# Patient Record
Sex: Male | Born: 1945 | Race: Black or African American | Hispanic: No | Smoking: Never smoker
Health system: Southern US, Community
[De-identification: ages and names within clinical notes are randomized; demographics above are authoritative.]

## PROBLEM LIST (undated history)

## (undated) DIAGNOSIS — E785 Hyperlipidemia, unspecified: Secondary | ICD-10-CM

## (undated) DIAGNOSIS — E119 Type 2 diabetes mellitus without complications: Secondary | ICD-10-CM

## (undated) DIAGNOSIS — I10 Essential (primary) hypertension: Secondary | ICD-10-CM

## (undated) DIAGNOSIS — S199XXA Unspecified injury of neck, initial encounter: Secondary | ICD-10-CM

## (undated) DIAGNOSIS — D509 Iron deficiency anemia, unspecified: Secondary | ICD-10-CM

## (undated) HISTORY — PX: CERVICAL SPINE SURGERY: SHX589

---

## 2013-02-21 ENCOUNTER — Inpatient Hospital Stay (HOSPITAL_COMMUNITY)
Admission: EM | Admit: 2013-02-21 | Discharge: 2013-02-28 | DRG: 592 | Disposition: A | Discharge status: Skilled Nursing Facility | Payer: Medicare Other | Attending: Internal Medicine | Admitting: Internal Medicine

## 2013-02-21 ENCOUNTER — Emergency Department (HOSPITAL_COMMUNITY): Payer: Medicare Other

## 2013-02-21 ENCOUNTER — Encounter (HOSPITAL_COMMUNITY): Payer: Self-pay | Admitting: Emergency Medicine

## 2013-02-21 DIAGNOSIS — L8990 Pressure ulcer of unspecified site, unspecified stage: Secondary | ICD-10-CM

## 2013-02-21 DIAGNOSIS — L89629 Pressure ulcer of left heel, unspecified stage: Secondary | ICD-10-CM | POA: Diagnosis present

## 2013-02-21 DIAGNOSIS — L97319 Non-pressure chronic ulcer of right ankle with unspecified severity: Secondary | ICD-10-CM | POA: Diagnosis present

## 2013-02-21 DIAGNOSIS — IMO0002 Reserved for concepts with insufficient information to code with codable children: Secondary | ICD-10-CM

## 2013-02-21 DIAGNOSIS — E875 Hyperkalemia: Secondary | ICD-10-CM | POA: Diagnosis present

## 2013-02-21 DIAGNOSIS — X58XXXA Exposure to other specified factors, initial encounter: Secondary | ICD-10-CM | POA: Diagnosis present

## 2013-02-21 DIAGNOSIS — S90829A Blister (nonthermal), unspecified foot, initial encounter: Secondary | ICD-10-CM | POA: Diagnosis present

## 2013-02-21 DIAGNOSIS — L97509 Non-pressure chronic ulcer of other part of unspecified foot with unspecified severity: Secondary | ICD-10-CM | POA: Diagnosis present

## 2013-02-21 DIAGNOSIS — M899 Disorder of bone, unspecified: Secondary | ICD-10-CM | POA: Diagnosis present

## 2013-02-21 DIAGNOSIS — Z79899 Other long term (current) drug therapy: Secondary | ICD-10-CM

## 2013-02-21 DIAGNOSIS — E119 Type 2 diabetes mellitus without complications: Secondary | ICD-10-CM

## 2013-02-21 DIAGNOSIS — Z7982 Long term (current) use of aspirin: Secondary | ICD-10-CM

## 2013-02-21 DIAGNOSIS — G8250 Quadriplegia, unspecified: Secondary | ICD-10-CM | POA: Diagnosis present

## 2013-02-21 DIAGNOSIS — L89899 Pressure ulcer of other site, unspecified stage: Secondary | ICD-10-CM | POA: Diagnosis present

## 2013-02-21 DIAGNOSIS — L988 Other specified disorders of the skin and subcutaneous tissue: Secondary | ICD-10-CM | POA: Diagnosis present

## 2013-02-21 DIAGNOSIS — M949 Disorder of cartilage, unspecified: Secondary | ICD-10-CM

## 2013-02-21 DIAGNOSIS — Z7401 Bed confinement status: Secondary | ICD-10-CM

## 2013-02-21 DIAGNOSIS — R532 Functional quadriplegia: Secondary | ICD-10-CM | POA: Diagnosis present

## 2013-02-21 DIAGNOSIS — E1169 Type 2 diabetes mellitus with other specified complication: Secondary | ICD-10-CM | POA: Diagnosis present

## 2013-02-21 DIAGNOSIS — N390 Urinary tract infection, site not specified: Secondary | ICD-10-CM | POA: Insufficient documentation

## 2013-02-21 DIAGNOSIS — L97309 Non-pressure chronic ulcer of unspecified ankle with unspecified severity: Principal | ICD-10-CM | POA: Diagnosis present

## 2013-02-21 HISTORY — DX: Unspecified injury of neck, initial encounter: S19.9XXA

## 2013-02-21 LAB — URINE MICROSCOPIC-ADD ON

## 2013-02-21 LAB — URINALYSIS, ROUTINE W REFLEX MICROSCOPIC
Bilirubin Urine: NEGATIVE
Glucose, UA: NEGATIVE mg/dL
Ketones, ur: NEGATIVE mg/dL
Nitrite: NEGATIVE
Protein, ur: 30 mg/dL — AB
SPECIFIC GRAVITY, URINE: 1.018 (ref 1.005–1.030)
Urobilinogen, UA: 0.2 mg/dL (ref 0.0–1.0)
pH: 5.5 (ref 5.0–8.0)

## 2013-02-21 LAB — COMPREHENSIVE METABOLIC PANEL
ALBUMIN: 3.1 g/dL — AB (ref 3.5–5.2)
ALT: <5 U/L (ref 0–53)
AST: 12 U/L (ref 0–37)
Alkaline Phosphatase: 93 U/L (ref 39–117)
BUN: 37 mg/dL — ABNORMAL HIGH (ref 6–23)
CALCIUM: 9.1 mg/dL (ref 8.4–10.5)
CO2: 22 meq/L (ref 19–32)
CREATININE: 1.13 mg/dL (ref 0.50–1.35)
Chloride: 104 mEq/L (ref 96–112)
GFR calc Af Amer: 76 mL/min — ABNORMAL LOW (ref >90)
GFR, EST NON AFRICAN AMERICAN: 65 mL/min — AB (ref >90)
Glucose, Bld: 138 mg/dL — ABNORMAL HIGH (ref 70–99)
Potassium: 4.1 mEq/L (ref 3.7–5.3)
Sodium: 142 mEq/L (ref 137–147)
Total Bilirubin: <0.2 mg/dL — ABNORMAL LOW (ref 0.3–1.2)
Total Protein: 8 g/dL (ref 6.0–8.3)

## 2013-02-21 LAB — CBC WITH DIFFERENTIAL/PLATELET
Basophils Absolute: 0 10*3/uL (ref 0.0–0.1)
Basophils Relative: 0 % (ref 0–1)
EOS ABS: 0.3 10*3/uL (ref 0.0–0.7)
EOS PCT: 4 % (ref 0–5)
HEMATOCRIT: 32 % — AB (ref 39.0–52.0)
Hemoglobin: 10.6 g/dL — ABNORMAL LOW (ref 13.0–17.0)
Lymphocytes Relative: 17 % (ref 12–46)
Lymphs Abs: 1.2 10*3/uL (ref 0.7–4.0)
MCH: 29.9 pg (ref 26.0–34.0)
MCHC: 33.1 g/dL (ref 30.0–36.0)
MCV: 90.4 fL (ref 78.0–100.0)
MONO ABS: 0.7 10*3/uL (ref 0.1–1.0)
Monocytes Relative: 10 % (ref 3–12)
Neutro Abs: 4.8 10*3/uL (ref 1.7–7.7)
Neutrophils Relative %: 68 % (ref 43–77)
Platelets: 280 10*3/uL (ref 150–400)
RBC: 3.54 MIL/uL — ABNORMAL LOW (ref 4.22–5.81)
RDW: 14.8 % (ref 11.5–15.5)
WBC: 7.1 10*3/uL (ref 4.0–10.5)

## 2013-02-21 IMAGING — CR DG FOOT COMPLETE 3+V*R*
3 series · 3 of 3 positions shown · non-contrast
Comparison: None.

CLINICAL DATA: Large great toe blister.

EXAM:
RIGHT FOOT COMPLETE - 3+ VIEW

[x foot lat right]
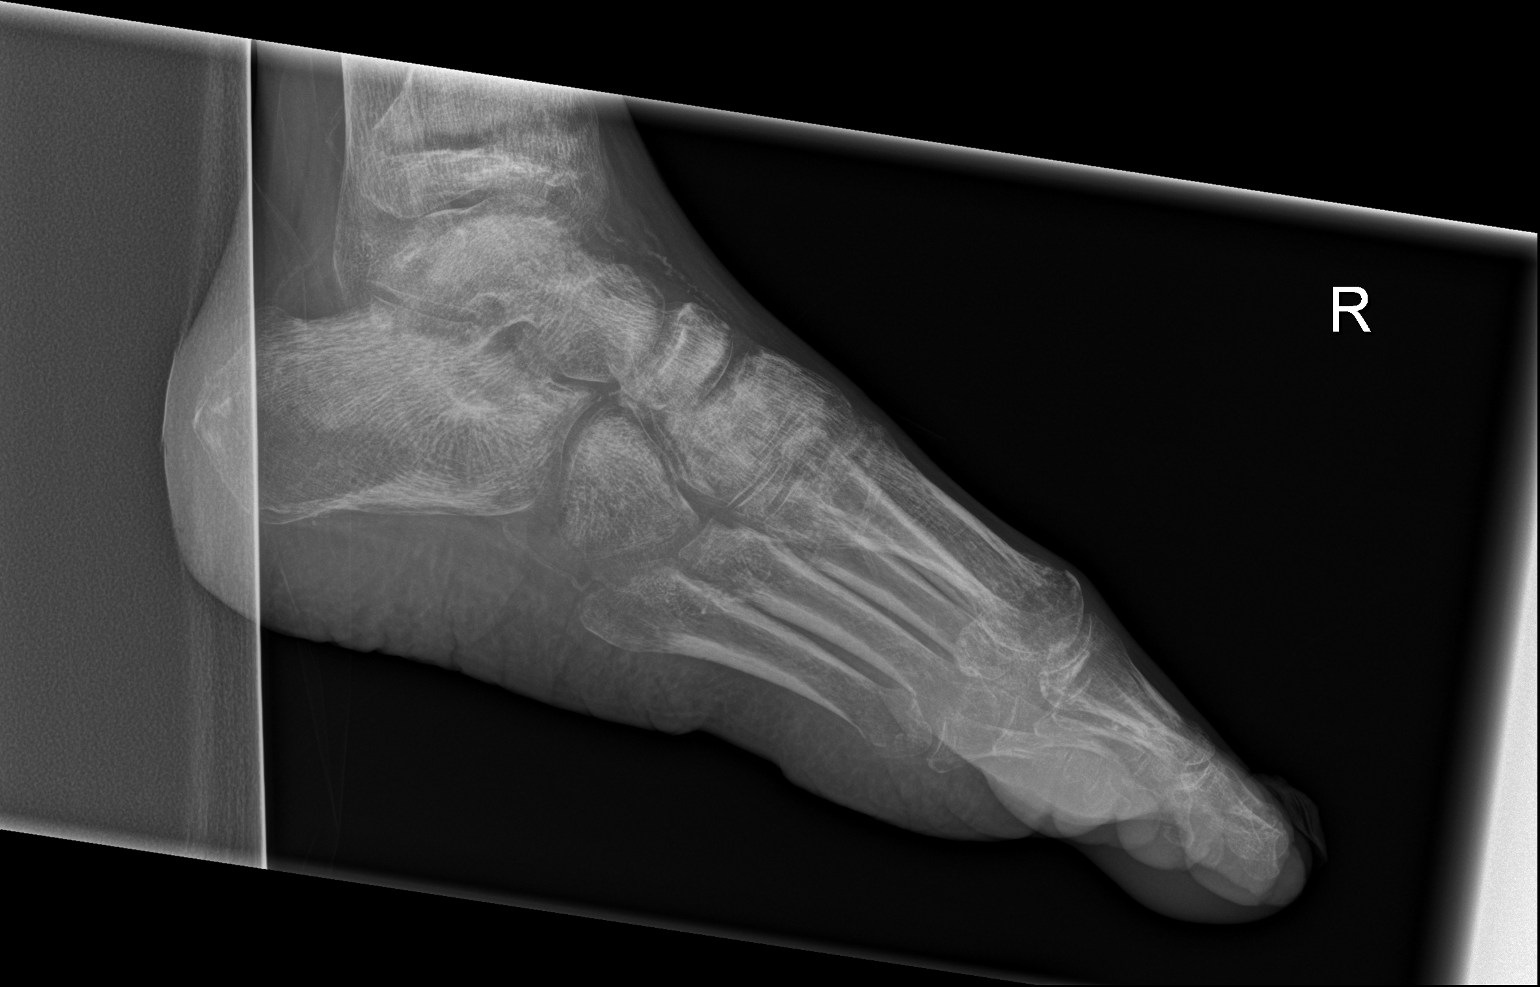

[x foot ap right]
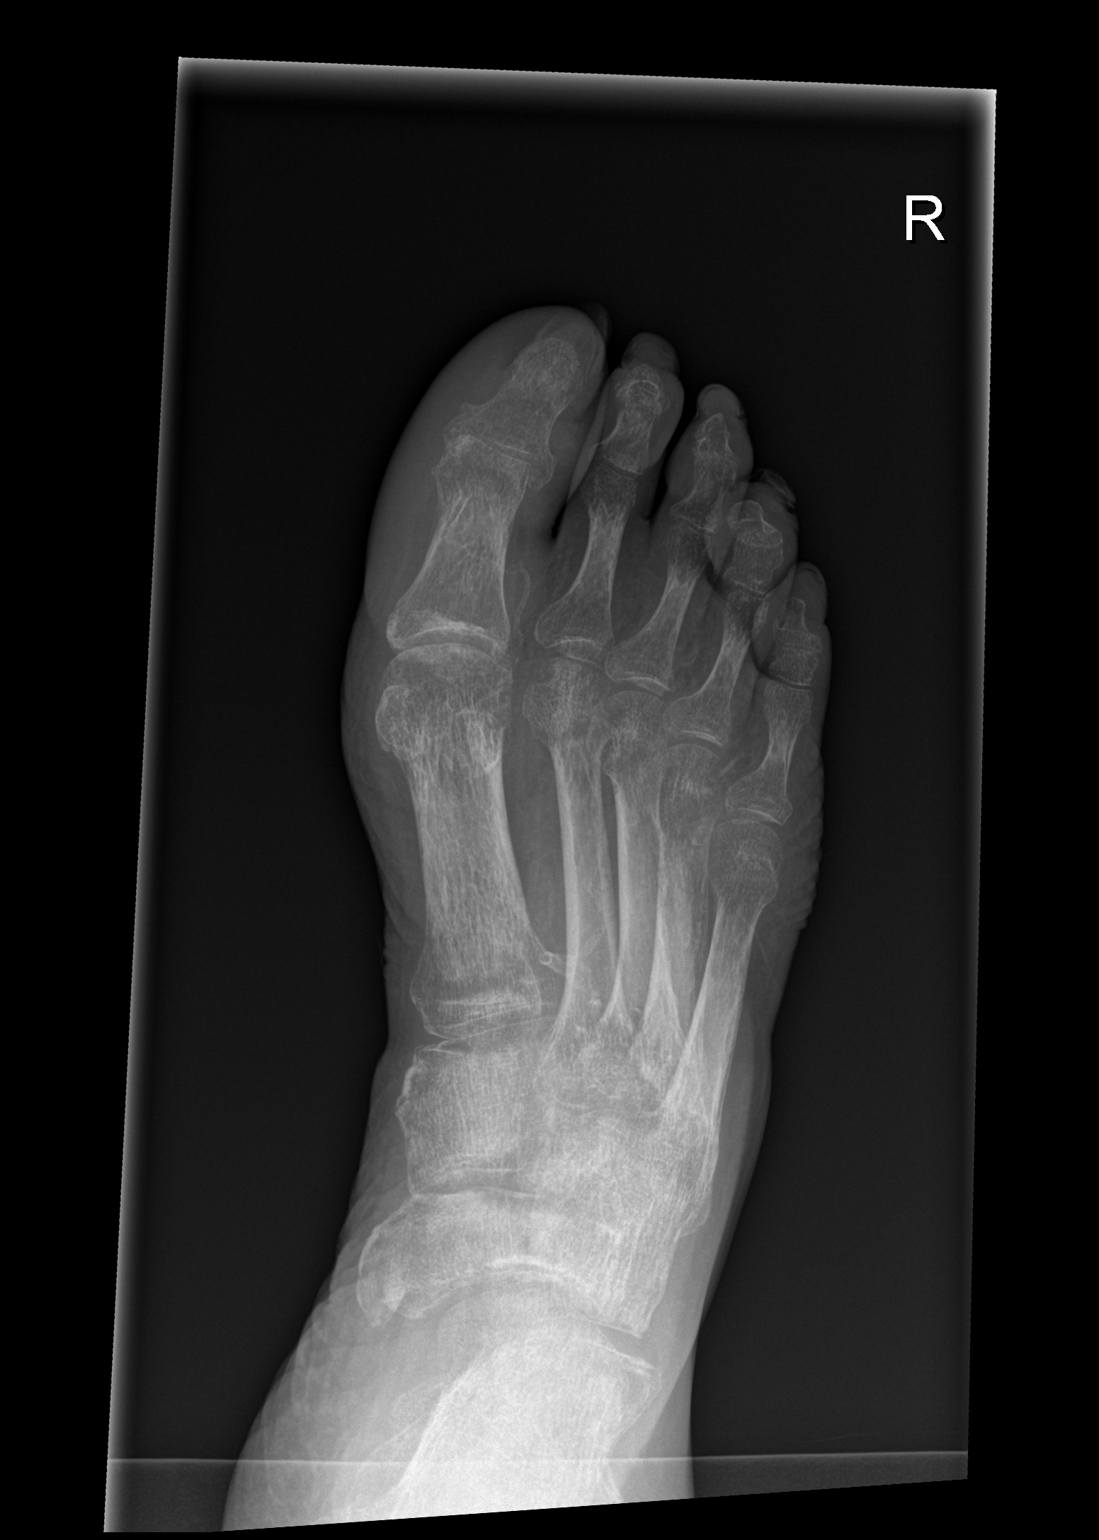

[x foot obl right]
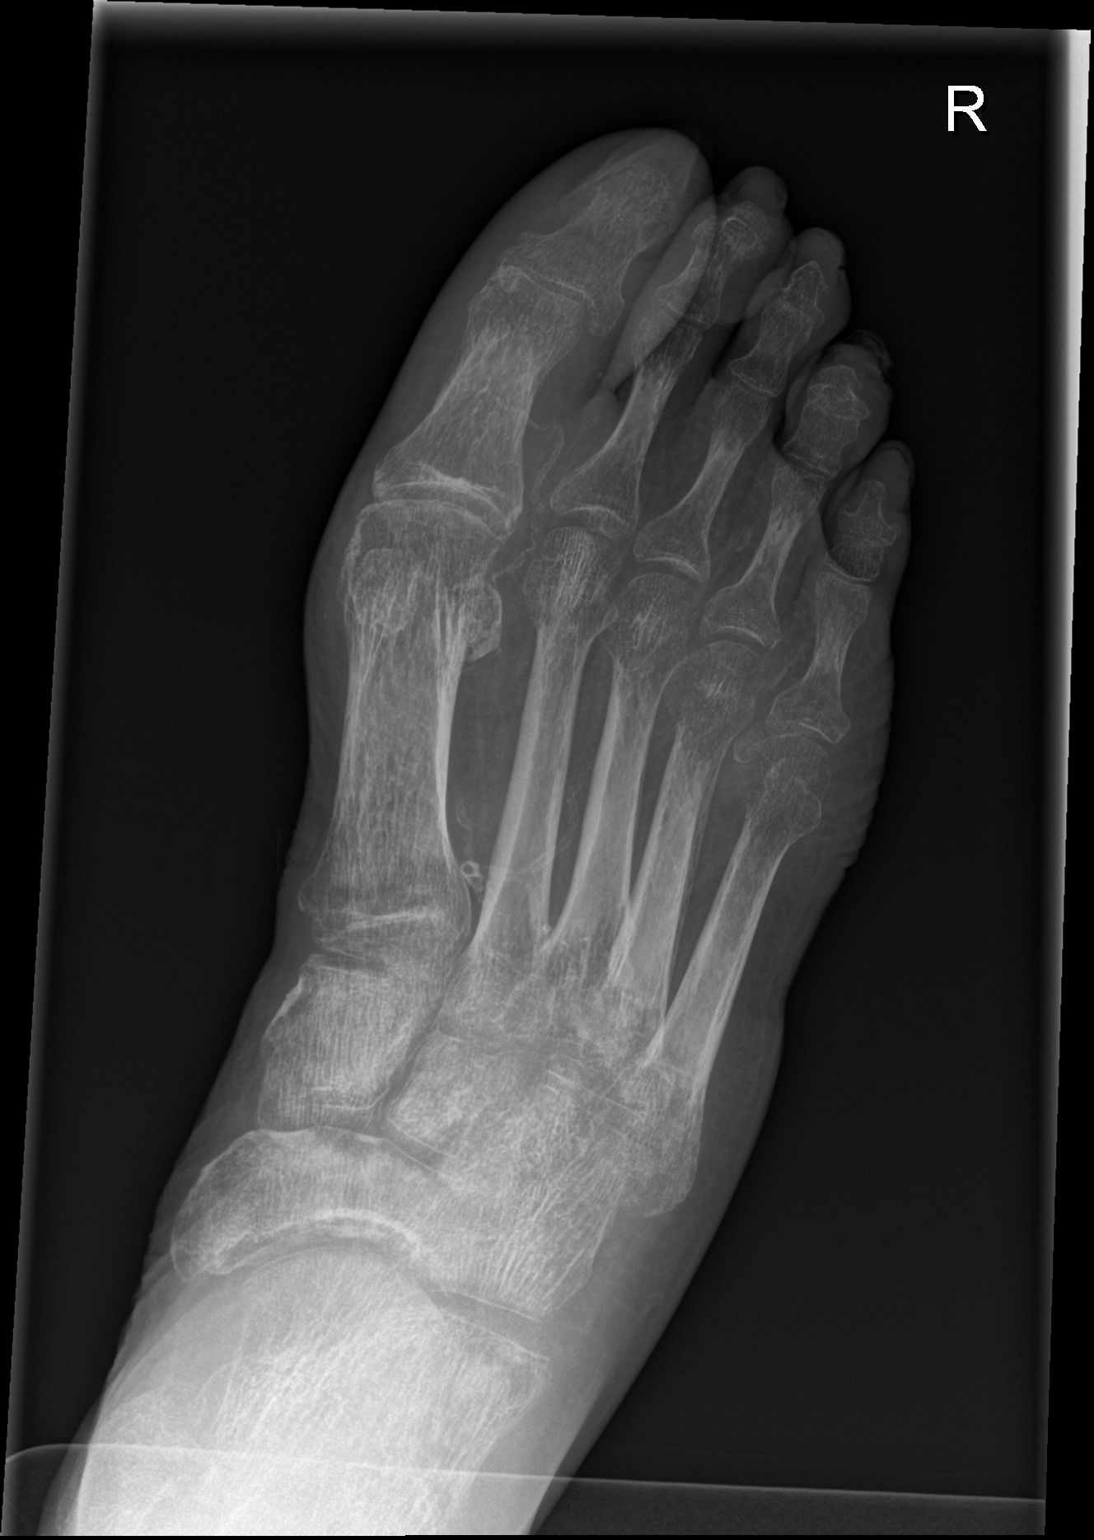

[3 of 3 positions shown; findings below may reference images not displayed]

FINDINGS: Diffuse osteopenia. Arterial calcifications. No fracture or
dislocation seen. No bone destruction or periosteal reaction.
IMPRESSION: Diffuse osteopenia. See the previous discussion on the right lower
leg radiographs. No acute bony abnormality.

## 2013-02-21 IMAGING — CR DG TIBIA/FIBULA 2V*R*
4 series · 4 of 4 positions shown · non-contrast
Comparison: None.

CLINICAL DATA: Right lower leg bed sore.

EXAM:
RIGHT TIBIA AND FIBULA - 2 VIEW

[x tib-fib ap right]
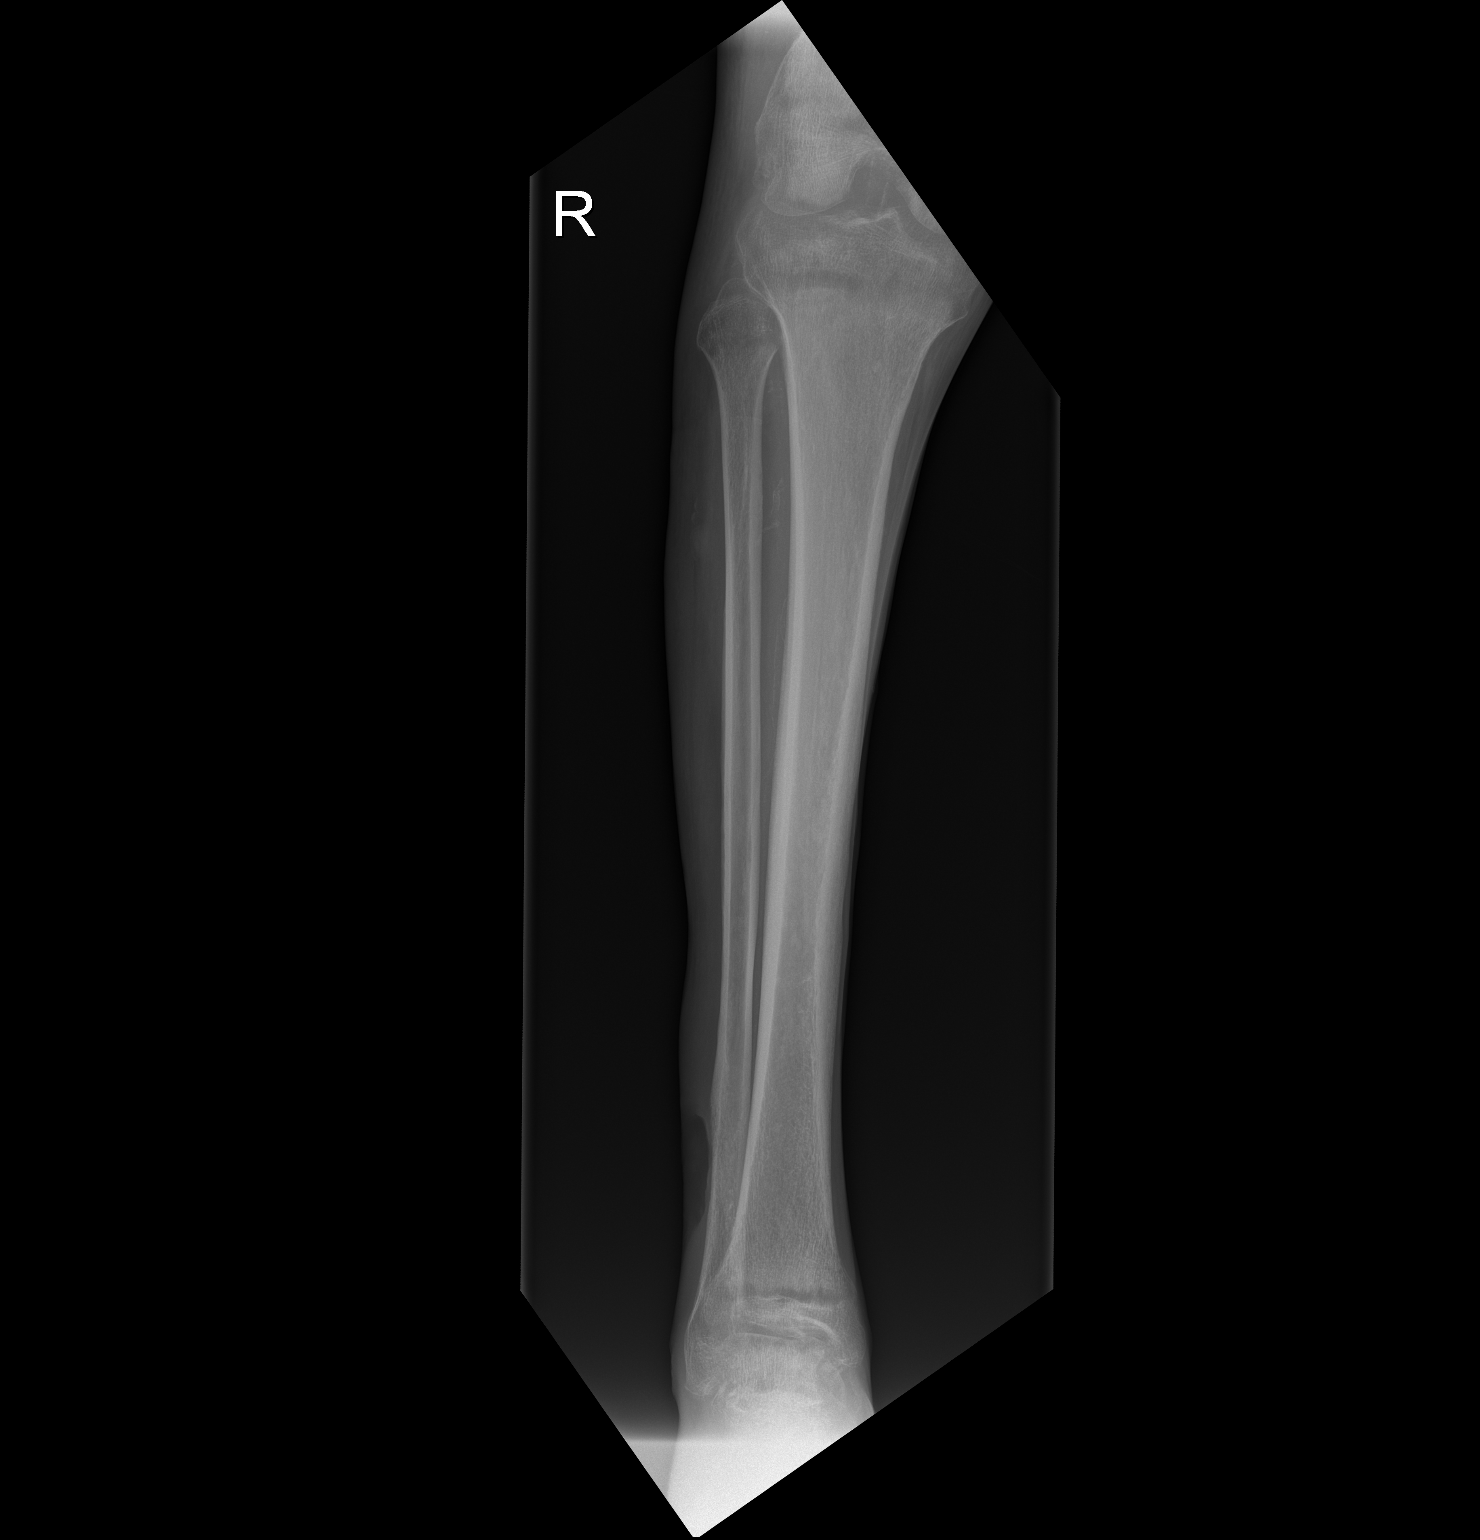

[x tib-fib lat right (1 of 3)]
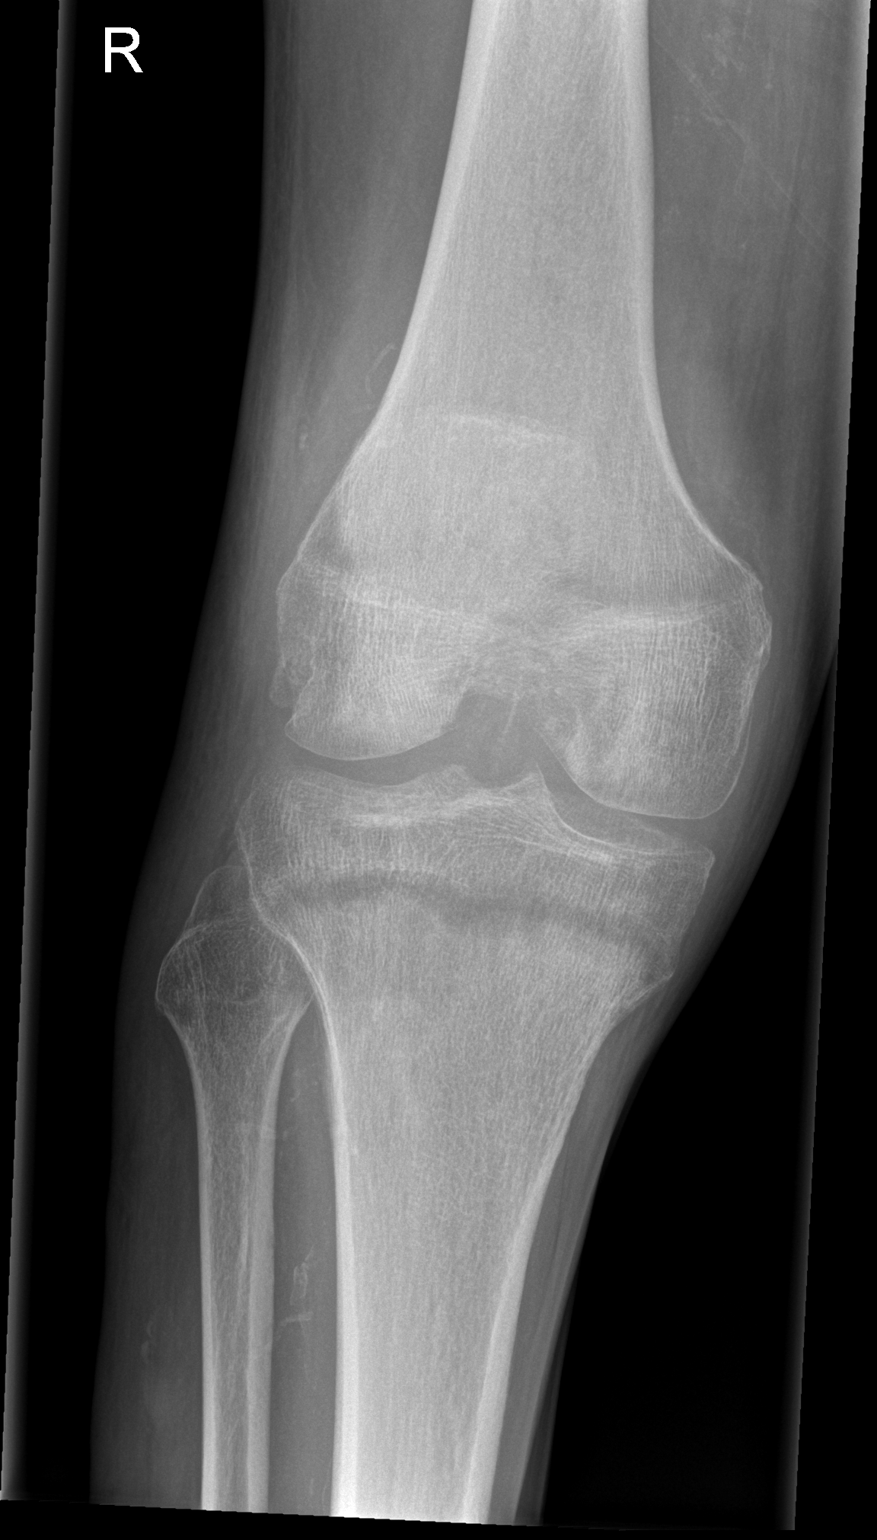

[x tib-fib lat right (2 of 3)]
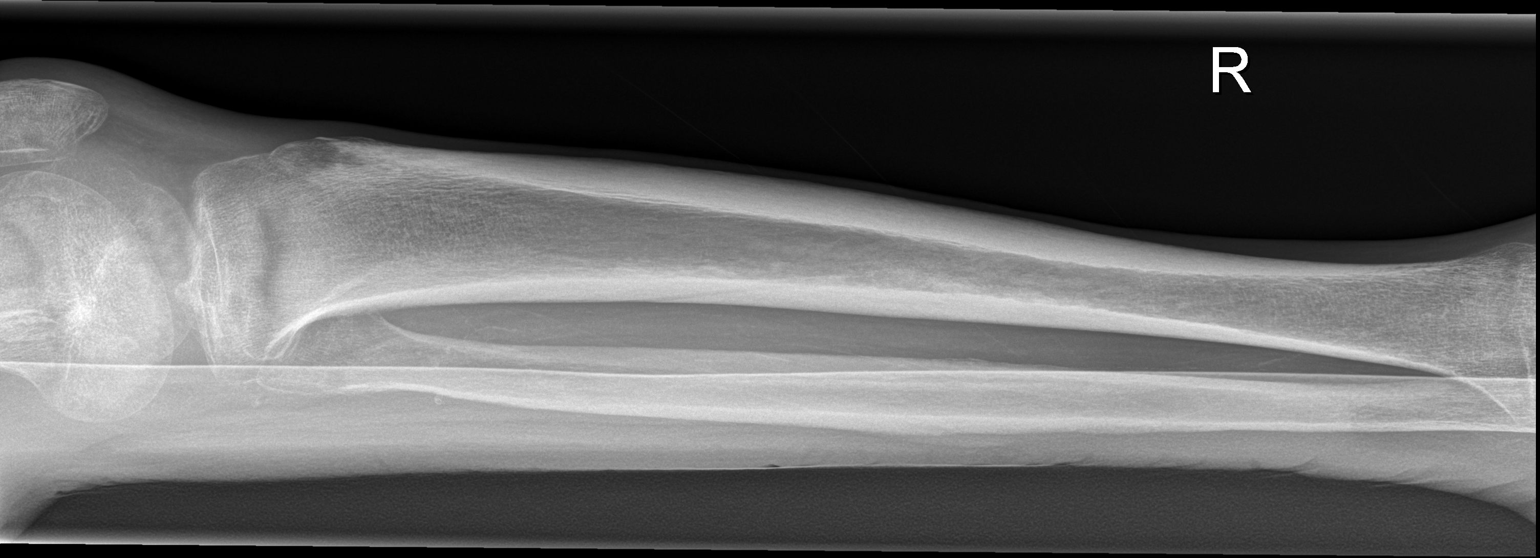

[x tib-fib lat right (3 of 3)]
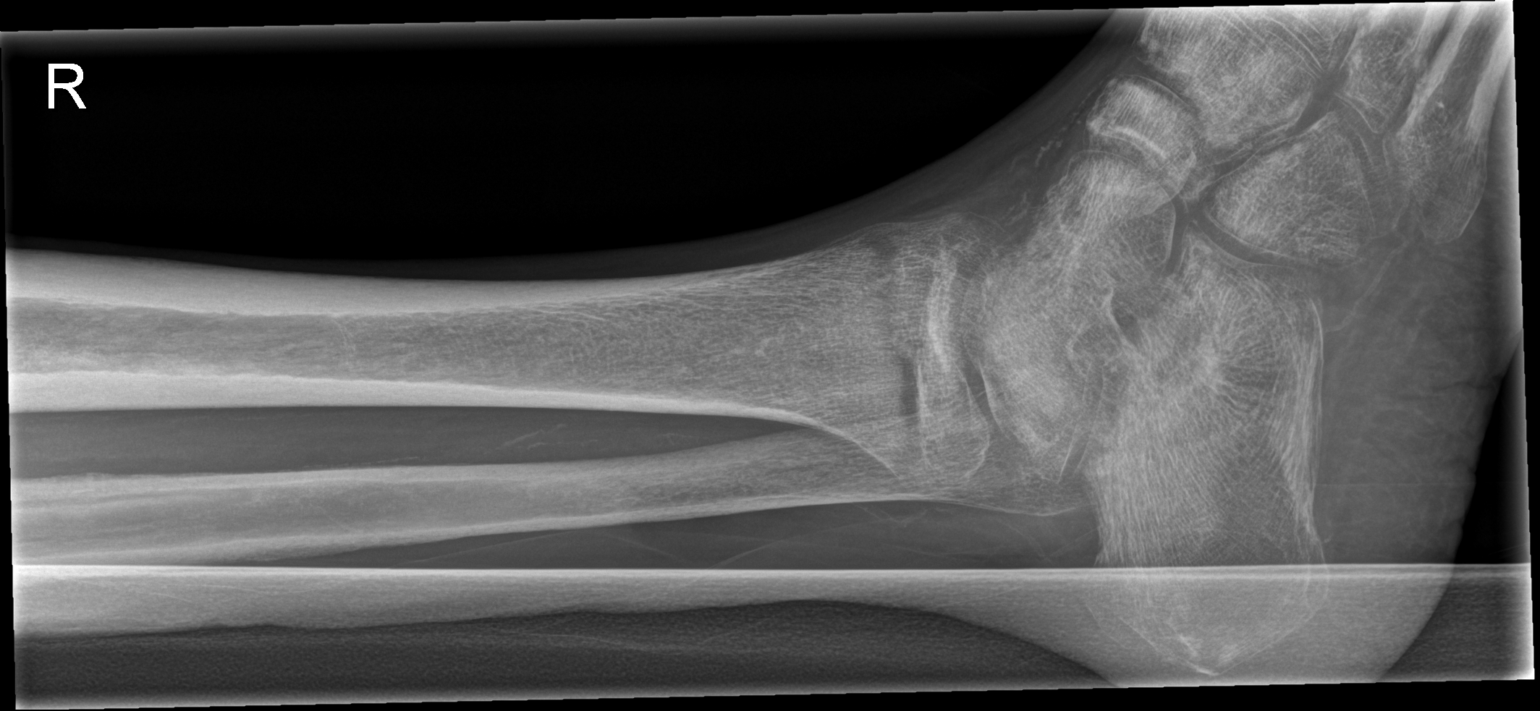

[4 of 4 positions shown; findings below may reference images not displayed]

FINDINGS: Soft tissue defect in the lateral aspect of the distal right lower
leg. Diffuse growth plate lucencies at the level of the ankle and
knee. No bone destruction or periosteal reaction at the location of
the soft tissue defect. Diffuse osteopenia involving the tarsal
bones.
IMPRESSION: 1. Distal lateral soft tissue defect without evidence of underlying
osteomyelitis.
2. Disuse osteopenia.
3. Diffuse bone resorption at all of the patient's previous growth
plates. This can be seen with rickets (osteomalacia), including
vitamin-D deficiency. Leukemia can also produce this appearance.

## 2013-02-21 MED ORDER — DEXTROSE 5 % IV SOLN: 1.0000 g | INTRAVENOUS | Status: DC

## 2013-02-21 MED ORDER — ASPIRIN 81 MG PO CHEW
81.0000 mg | CHEWABLE_TABLET | Freq: Every day | ORAL | Status: DC
  Administered 2013-02-22 – 2013-02-28 (×7): 81 mg via ORAL
  Filled 2013-02-21 (×7): qty 1

## 2013-02-21 MED ORDER — HEPARIN SODIUM (PORCINE) 5000 UNIT/ML IJ SOLN
5000.0000 [IU] | Freq: Three times a day (TID) | INTRAMUSCULAR | Status: DC
  Administered 2013-02-22 – 2013-02-27 (×18): 5000 [IU] via SUBCUTANEOUS
  Filled 2013-02-21 (×20): qty 1

## 2013-02-21 MED ORDER — FERROUS SULFATE 325 (65 FE) MG PO TABS
325.0000 mg | ORAL_TABLET | Freq: Two times a day (BID) | ORAL | Status: DC
  Administered 2013-02-22 – 2013-02-28 (×13): 325 mg via ORAL
  Filled 2013-02-21 (×15): qty 1

## 2013-02-21 MED ORDER — METFORMIN HCL ER 500 MG PO TB24
500.0000 mg | ORAL_TABLET | Freq: Every day | ORAL | Status: DC
  Administered 2013-02-22 – 2013-02-28 (×7): 500 mg via ORAL
  Filled 2013-02-21 (×8): qty 1

## 2013-02-21 MED ORDER — TRAMADOL HCL 50 MG PO TABS
50.0000 mg | ORAL_TABLET | Freq: Two times a day (BID) | ORAL | Status: DC
  Administered 2013-02-22 – 2013-02-28 (×13): 50 mg via ORAL
  Filled 2013-02-21 (×13): qty 1

## 2013-02-21 MED ORDER — ESCITALOPRAM OXALATE 10 MG PO TABS
10.0000 mg | ORAL_TABLET | Freq: Every day | ORAL | Status: DC
  Administered 2013-02-22 – 2013-02-28 (×7): 10 mg via ORAL
  Filled 2013-02-21 (×7): qty 1

## 2013-02-21 MED ORDER — DEXTROSE 5 % IV SOLN
1.0000 g | INTRAVENOUS | Status: DC
  Administered 2013-02-21 – 2013-02-23 (×3): 1 g via INTRAVENOUS
  Filled 2013-02-21 (×4): qty 10

## 2013-02-21 MED ORDER — LISINOPRIL 5 MG PO TABS
5.0000 mg | ORAL_TABLET | Freq: Every day | ORAL | Status: DC
  Administered 2013-02-23 – 2013-02-25 (×2): 5 mg via ORAL
  Filled 2013-02-21 (×4): qty 1
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
## 2013-02-21 NOTE — ED Notes (Signed)
MD Allen at bedside.

## 2013-02-21 NOTE — ED Notes (Signed)
Bed: [id number] Expected date: 02/21/13 Expected time: 5:54 PM Means of arrival: Ambulance Comments: Bed sores

## 2013-02-21 NOTE — ED Notes (Signed)
Tired getting urine from patient. Foley may need to be inserted because he is in content at times.
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
## 2013-02-21 NOTE — ED Notes (Signed)
Pt in XRAY 

## 2013-02-21 NOTE — ED Notes (Addendum)
Pt home via [facility] c/o bedsore to right lower leg and and a large blister to great toe of right foot. Pt has good peripheral pulses and denies pain at this time. He reports having the blister to the great toe x 3 weeks and the bedsores to posterior lateral lower right leg x 3 months.

## 2013-02-22 ENCOUNTER — Encounter (HOSPITAL_COMMUNITY): Payer: Self-pay

## 2013-02-22 DIAGNOSIS — E119 Type 2 diabetes mellitus without complications: Secondary | ICD-10-CM

## 2013-02-22 LAB — GLUCOSE, CAPILLARY
GLUCOSE-CAPILLARY: 94 mg/dL (ref 70–99)
GLUCOSE-CAPILLARY: 107 mg/dL — AB (ref 70–99)
Glucose-Capillary: 142 mg/dL — ABNORMAL HIGH (ref 70–99)

## 2013-02-22 LAB — CBC
HCT: 25.8 % — ABNORMAL LOW (ref 39.0–52.0)
Hemoglobin: 8.6 g/dL — ABNORMAL LOW (ref 13.0–17.0)
MCH: 29.9 pg (ref 26.0–34.0)
MCHC: 33.3 g/dL (ref 30.0–36.0)
MCV: 89.6 fL (ref 78.0–100.0)
Platelets: 265 10*3/uL (ref 150–400)
RBC: 2.88 MIL/uL — AB (ref 4.22–5.81)
RDW: 15 % (ref 11.5–15.5)
WBC: 5.9 10*3/uL (ref 4.0–10.5)

## 2013-02-22 LAB — BASIC METABOLIC PANEL
BUN: 38 mg/dL — ABNORMAL HIGH (ref 6–23)
CALCIUM: 8.3 mg/dL — AB (ref 8.4–10.5)
CO2: 21 mEq/L (ref 19–32)
Chloride: 104 mEq/L (ref 96–112)
Creatinine, Ser: 0.96 mg/dL (ref 0.50–1.35)
GFR calc Af Amer: >90 mL/min (ref >90)
GFR, EST NON AFRICAN AMERICAN: 84 mL/min — AB (ref >90)
Glucose, Bld: 219 mg/dL — ABNORMAL HIGH (ref 70–99)
Potassium: 3.6 mEq/L — ABNORMAL LOW (ref 3.7–5.3)
SODIUM: 139 meq/L (ref 137–147)

## 2013-02-22 LAB — MRSA PCR SCREENING: MRSA BY PCR: NEGATIVE

## 2013-02-22 MED ORDER — MORPHINE SULFATE 2 MG/ML IJ SOLN
2.0000 mg | INTRAMUSCULAR | Status: DC | PRN
  Administered 2013-02-23 – 2013-02-27 (×6): 2 mg via INTRAVENOUS
  Filled 2013-02-22 (×7): qty 1
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
## 2013-02-22 NOTE — Progress Notes (Signed)
Utilization review completed.  
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
## 2013-02-23 LAB — URINE CULTURE: Colony Count: >=100000

## 2013-02-23 LAB — GLUCOSE, CAPILLARY
GLUCOSE-CAPILLARY: 167 mg/dL — AB (ref 70–99)
Glucose-Capillary: 161 mg/dL — ABNORMAL HIGH (ref 70–99)
Glucose-Capillary: 166 mg/dL — ABNORMAL HIGH (ref 70–99)
Glucose-Capillary: 133 mg/dL — ABNORMAL HIGH (ref 70–99)

## 2013-02-23 MED ORDER — ENSURE COMPLETE PO LIQD
237.0000 mL | Freq: Three times a day (TID) | ORAL | Status: DC
  Administered 2013-02-23 – 2013-02-28 (×14): 237 mL via ORAL

## 2013-02-23 MED ORDER — SODIUM CHLORIDE 0.9 % IV BOLUS (SEPSIS)
500.0000 mL | Freq: Once | INTRAVENOUS | Status: AC
  Administered 2013-02-24: 500 mL via INTRAVENOUS

## 2013-02-23 MED ORDER — ADULT MULTIVITAMIN W/MINERALS CH
1.0000 | ORAL_TABLET | Freq: Every day | ORAL | Status: DC
  Administered 2013-02-24 – 2013-02-28 (×6): 1 via ORAL
  Filled 2013-02-23 (×6): qty 1
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
## 2013-02-23 NOTE — Progress Notes (Signed)
Clinical Social Work Department BRIEF PSYCHOSOCIAL ASSESSMENT 02/23/2013  Patient:  [name] [name],[name] [name]     Account Number:  [id number]     Admit date:  02/21/2013  Clinical Social Worker:  Earlie Server  Date/Time:  02/23/2013 02:30 PM  Referred by:  Physician  Date Referred:  02/23/2013 Referred for  SNF Placement   Other Referral:   Interview type:  Patient Other interview type:    PSYCHOSOCIAL DATA Living Status:  FAMILY Admitted from facility:   Level of care:   Primary support name:  [name] Primary support relationship to patient:  CHILD, ADULT Degree of support available:   Strong    CURRENT CONCERNS Current Concerns  Post-Acute Placement   Other Concerns:    SOCIAL WORK ASSESSMENT / PLAN CSW received referral in order to assist with DC planning. CSW reviewed chart and met with patient at bedside. CSW introduced myself and explained role.    Patient reports he was living at home with his dtr prior to be admitting to the hospital. Patient reports that dtr does not work and that she assists with all ADLs. Patient states that dtr may leave the house to go shopping but is not gone for long. Patient reports that he is unable to do anything by himself and relies on dtr for needs. CSW and patient discussed SNF placement at DC. CSW explained the process and explained that patient could receive nursing care at [facility]. CSW explained insurance coverage as well. Patient reports he is not interested in SNF placement and will return home at DC. Patient reports he is not active with HH but that dtr can learn to assist with any new medical needs. CSW explained the benefits of SNF and reported that patient could even go for ST if desired but patient continues to refuse placement.    CSW will continue to follow and assist if patient chooses SNF placement.   Assessment/plan status:  Psychosocial Support/Ongoing Assessment of Needs Other assessment/ plan:   Information/referral to community  resources:   SNF information    PATIENT'S/FAMILY'S RESPONSE TO PLAN OF CARE: Patient alert and oriented. Patient reports that dtr is very helpful and he is ready to return back home. Patient states that he loves his dtr and would feel most comfortable at home. Patient is not interested in SNF and worrying about losing his privacy if he has to go to a SNF. Patient aware that he could go ST to determine if he wants to stay LT but patient not agreeable at this time. Patient polite and engaged and thanked CSW for time.       [place], [place] [phone]

## 2013-02-23 NOTE — Consult Note (Signed)
WOC wound consult note Reason for Consult: Right toe, right LE (lateral aspect), sacral ulcers Wound type: Pressure Pressure Ulcer POA: Yes Measurement:right great toe (hallux) with intact blood filled blister on medial aspect measuring 7.5cm x 5cm. Skin is taught. periwound is not erythematous.  Patient cannot recall injuring toe.  Chronic non-healing wounds on lateral aspect of right LE:  A deep, full thickness defect measuring 4cm x 2cm x 0.5cm with a red, wet base has a small proximal (1.0cm round x 0.2cm) and a small distal wound (1.0cm round x 0.2cm) both partial thickness and clean pink and moist. The sacrum presents with a recurring ulcer over a previously healed Stage III pressure ulcer measuring 1.5cm x 1cm x 0.2cm and presenting as clean, pink and moist. Wound bed: As described above. Drainage (amount, consistency, odor) scant from all partial thickness wounds (sacrum, proximal and distal Le on right) and moderate from the right lateral LE. Periwound:intact with evidence of previous healing. Dressing procedure/placement/frequency:I will ask nursing to continue observation of the blood filled blister on the right medial toe and have given them a dressing plan for the area if the blister should rupture.  The partial thickness wounds will benefit from soft silicone foam dressings changed twice weekly and the wound with depth on the right lateral LE will need filling with an absorbant antimicrobial dressing (Aquacel Ag+) changed daily.  Patient states he has not been out of bed for an extended period of time due to his caregiver having back issues.  His finger nails are long and loosening and are at risk for creating wounds on the palmar area of his right hand.  I understand that patient is not willing to consider another level of care other than home, but it would be my recommendation to pursue a higher level of care or additional support for this patient in the home care setting. [facility] nursing team  will not follow, but will remain available to this patient, the nursing and medical teams.  Please re-consult if needed. Thanks, Maudie Flakes, MSN, RN, [place], [place], [place] [phone])

## 2013-02-23 NOTE — Clinical Documentation Improvement (Signed)
Possible Clinical Conditions?  Severe Malnutrition , Underweight with BMI  18.91 Protein Calorie Malnutrition , , Underweight with BMI  18.91 Severe Protein Calorie Malnutrition,   , Underweight with BMI  18.91 Other Condition Cannot clinically determine  Supporting Information: Per 02/23/13 RD progress note: Pt meets criteria for severe MALNUTRITION in the context of chronic illness as evidenced by severe muscle wasting and subcutaneous fat loss in orbital/temporal and clavicle region and arms.  BMI:  Body mass index is 18.19 kg/(m^2).     Underweight    Thank You, Serena Colonel ,RN Clinical Documentation Specialist:  [place] [employer] [facility]
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
## 2013-02-23 NOTE — Consult Note (Signed)
I participated in the care of this patient and agree with the above history, physical and evaluation. I performed a review of the history and a physical exam as detailed.  As patient is still in house I will discuss with Dr. Sharol Given about seeing him while hospitalized.    Carole Binning MD

## 2013-02-23 NOTE — Progress Notes (Signed)
Assisted [name] [name], NT with patients bath/linen change.  Patient has multiple healing buttocks sores present on admission covered with foam dressing.  Patient states he lives at home with his daughter who can not lift him so he is in bed all the time.  Right great toe blister present, right ankle radiating upwards leg, dressed with foam dressing, no drainage observed.  Patient turned, lotion, and patient protective creams applied to limbs, hands, arms, and feet.  Right hand is contracted and rolled cloth placed in palm with fingers around cloth.

## 2013-02-24 LAB — WOUND CULTURE: GRAM STAIN: NONE SEEN

## 2013-02-24 LAB — GLUCOSE, CAPILLARY: Glucose-Capillary: 137 mg/dL — ABNORMAL HIGH (ref 70–99)

## 2013-02-24 MED ORDER — ACETAMINOPHEN 325 MG PO TABS
650.0000 mg | ORAL_TABLET | ORAL | Status: DC | PRN
  Administered 2013-02-24 – 2013-02-28 (×2): 650 mg via ORAL
  Filled 2013-02-24 (×2): qty 2

## 2013-02-24 MED ORDER — SILVER SULFADIAZINE 1 % EX CREA
TOPICAL_CREAM | Freq: Every day | CUTANEOUS | Status: DC
  Administered 2013-02-26 – 2013-02-28 (×3): via TOPICAL
  Filled 2013-02-24: qty 50

## 2013-02-24 MED ORDER — SODIUM CHLORIDE 0.9 % IV SOLN
INTRAVENOUS | Status: DC
  Administered 2013-02-24: 75 mL/h via INTRAVENOUS
  Administered 2013-02-25 – 2013-02-26 (×3): via INTRAVENOUS
  Administered 2013-02-26: 75 mL via INTRAVENOUS

## 2013-02-24 MED ORDER — LEVOFLOXACIN IN D5W 750 MG/150ML IV SOLN
750.0000 mg | INTRAVENOUS | Status: DC
  Administered 2013-02-24: 750 mg via INTRAVENOUS
  Filled 2013-02-24 (×2): qty 150

## 2013-02-24 NOTE — Progress Notes (Signed)
Clinical Social Work  CSW met with patient and CM at bedside in order to discuss DC planning. Patient laying in bed and reports that he still wants to return home at DC. CM spoke with patient in further detail about medical needs and support that would be provided through HH. Patient reports that he wants to talk with dtr but dtr is at work right now. Previously patient had reported that dtr does not work but today he states that she works a few hours a day. CSW and CM spoke with patient about concern for getting proper care and needing 24 hour supervision along with nursing care for wounds. Patient reports he is not happy about SNF placement but would be agreeable to go on ST basis if dtr is agreeable.CSW called and spoke with dtr who reports she has been trying to care for patient but is understanding that ST SNF could be beneficial for patient.  Patient and dtr aware of SNF process along with Medicare coverage for SNF placement. Patient agreeable to [place] search. CSW agreeable to provide bed offers to patient and dtr.  CSW completed FL2 and faxed out. CSW will continue to follow.   , LCSW [phone] 

## 2013-02-24 NOTE — Discharge Instructions (Signed)
Continued daily Silvadene dressing changes to the ulcers to the right foot and ankle.
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
## 2013-02-24 NOTE — Progress Notes (Addendum)
Clinical Social Work Department CLINICAL SOCIAL WORK PLACEMENT NOTE 02/24/2013  Patient:  [name] [name],[name] [name]  Account Number:  [id number] Admit date:  02/21/2013  Clinical Social Worker:  Sindy Messing, LCSW  Date/time:  02/24/2013 11:30 AM  Clinical Social Work is seeking post-discharge placement for this patient at the following level of care:   [place]   (*CSW will update this form in Epic as items are completed)   02/24/2013  Patient/family provided with [place] Department of Clinical Social Work's list of facilities offering this level of care within the geographic area requested by the patient (or if unable, by the patient's family).  02/24/2013  Patient/family informed of their freedom to choose among providers that offer the needed level of care, that participate in Medicare, Medicaid or managed care program needed by the patient, have an available bed and are willing to accept the patient.  02/24/2013  Patient/family informed of [facility]' ownership interest in [facility], as well as of the fact that they are under no obligation to receive care at this facility.  PASARR submitted to EDS on existing # PASARR number received from EDS on   FL2 transmitted to all facilities in geographic area requested by pt/family on  02/24/2013 FL2 transmitted to all facilities within larger geographic area on   Patient informed that his/her managed care company has contracts with or will negotiate with  certain facilities, including the following:     Patient/family informed of bed offers received:  02/25/13 Patient chooses bed at [facility] Physician recommends and patient chooses bed at    Patient to be transferred to  on   Patient to be transferred to facility by   The following physician request were entered in Epic:   Additional Comments:

## 2013-02-24 NOTE — Progress Notes (Addendum)
Pt hypotensive, T: 100.2 R P: 98  RR: 20  BP: 74/48 SpO2: 98, alert and oriented at baseline. On-call provider informed.Will administer Tylenol and implement order as directed. Will reassess VS and continue to monitor.
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
## 2013-02-25 DIAGNOSIS — E875 Hyperkalemia: Secondary | ICD-10-CM

## 2013-02-25 LAB — GLUCOSE, CAPILLARY: Glucose-Capillary: 103 mg/dL — ABNORMAL HIGH (ref 70–99)

## 2013-02-25 LAB — BASIC METABOLIC PANEL
BUN: 37 mg/dL — ABNORMAL HIGH (ref 6–23)
CO2: 22 mEq/L (ref 19–32)
Calcium: 8.5 mg/dL (ref 8.4–10.5)
Chloride: 103 mEq/L (ref 96–112)
Creatinine, Ser: 1.25 mg/dL (ref 0.50–1.35)
GFR calc Af Amer: 67 mL/min — ABNORMAL LOW (ref >90)
GFR, EST NON AFRICAN AMERICAN: 58 mL/min — AB (ref >90)
GLUCOSE: 110 mg/dL — AB (ref 70–99)
POTASSIUM: 5.8 meq/L — AB (ref 3.7–5.3)
SODIUM: 136 meq/L — AB (ref 137–147)

## 2013-02-25 LAB — CBC
HCT: 24.2 % — ABNORMAL LOW (ref 39.0–52.0)
HEMOGLOBIN: 7.8 g/dL — AB (ref 13.0–17.0)
MCH: 29.1 pg (ref 26.0–34.0)
MCHC: 32.2 g/dL (ref 30.0–36.0)
MCV: 90.3 fL (ref 78.0–100.0)
Platelets: 264 10*3/uL (ref 150–400)
RBC: 2.68 MIL/uL — ABNORMAL LOW (ref 4.22–5.81)
RDW: 15.5 % (ref 11.5–15.5)
WBC: 5.8 10*3/uL (ref 4.0–10.5)

## 2013-02-25 MED ORDER — INSULIN ASPART 100 UNIT/ML ~~LOC~~ SOLN: 10.0000 [IU] | Freq: Once | SUBCUTANEOUS | Status: DC

## 2013-02-25 MED ORDER — DEXTROSE 50 % IV SOLN
1.0000 | Freq: Once | INTRAVENOUS | Status: AC
  Administered 2013-02-25: 50 mL via INTRAVENOUS
  Filled 2013-02-25: qty 50

## 2013-02-25 MED ORDER — INSULIN ASPART 100 UNIT/ML IV SOLN
10.0000 [IU] | Freq: Once | INTRAVENOUS | Status: AC
  Administered 2013-02-25: 10 [IU] via INTRAVENOUS
  Filled 2013-02-25: qty 0.1
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
## 2013-02-25 NOTE — Progress Notes (Signed)
Clinical Social Work  CSW met with patient at bedside and provided bed offers. Patient reports he is not familiar with any SNFs and is unsure about choosing a facility. CSW spoke with dtr via phone who reports she will assist patient in choosing a facility so that he will feel more comfortable. CSW left copy of bed offers in room and explained offers via phone to dtr. Dtr reports she will come visit this afternoon in order to discuss plans with patient. CSW explained that decision would need to be made once MD reports that patient is medically stable to DC and dtr reports understanding.  CSW will continue to follow.  [place], [place] [phone]

## 2013-02-26 LAB — BASIC METABOLIC PANEL
BUN: 36 mg/dL — ABNORMAL HIGH (ref 6–23)
CO2: 23 mEq/L (ref 19–32)
Calcium: 8.7 mg/dL (ref 8.4–10.5)
Chloride: 105 mEq/L (ref 96–112)
Creatinine, Ser: 1.04 mg/dL (ref 0.50–1.35)
GFR calc Af Amer: 84 mL/min — ABNORMAL LOW (ref >90)
GFR calc non Af Amer: 72 mL/min — ABNORMAL LOW (ref >90)
GLUCOSE: 104 mg/dL — AB (ref 70–99)
POTASSIUM: 6.3 meq/L — AB (ref 3.7–5.3)
SODIUM: 136 meq/L — AB (ref 137–147)

## 2013-02-26 LAB — GLUCOSE, CAPILLARY
GLUCOSE-CAPILLARY: 102 mg/dL — AB (ref 70–99)
GLUCOSE-CAPILLARY: 120 mg/dL — AB (ref 70–99)
Glucose-Capillary: 164 mg/dL — ABNORMAL HIGH (ref 70–99)
Glucose-Capillary: 101 mg/dL — ABNORMAL HIGH (ref 70–99)

## 2013-02-26 LAB — CBC
HCT: 24.1 % — ABNORMAL LOW (ref 39.0–52.0)
Hemoglobin: 7.8 g/dL — ABNORMAL LOW (ref 13.0–17.0)
MCH: 29.4 pg (ref 26.0–34.0)
MCHC: 32.4 g/dL (ref 30.0–36.0)
MCV: 90.9 fL (ref 78.0–100.0)
Platelets: 275 10*3/uL (ref 150–400)
RBC: 2.65 MIL/uL — AB (ref 4.22–5.81)
RDW: 15.3 % (ref 11.5–15.5)
WBC: 5.7 10*3/uL (ref 4.0–10.5)

## 2013-02-26 MED ORDER — DEXTROSE 50 % IV SOLN
1.0000 | Freq: Once | INTRAVENOUS | Status: AC
  Administered 2013-02-26: 50 mL via INTRAVENOUS
  Filled 2013-02-26: qty 50

## 2013-02-26 MED ORDER — INSULIN ASPART 100 UNIT/ML ~~LOC~~ SOLN
10.0000 [IU] | Freq: Once | SUBCUTANEOUS | Status: AC
  Administered 2013-02-26: 10 [IU] via SUBCUTANEOUS

## 2013-02-26 MED ORDER — SODIUM POLYSTYRENE SULFONATE 15 GM/60ML PO SUSP
15.0000 g | Freq: Once | ORAL | Status: AC
  Administered 2013-02-26: 15 g via ORAL
  Filled 2013-02-26: qty 60

## 2013-02-26 MED ORDER — DOCUSATE SODIUM 283 MG RE ENEM
1.0000 | ENEMA | Freq: Every day | RECTAL | Status: DC
  Administered 2013-02-26 – 2013-02-28 (×2): 283 mg via RECTAL
  Filled 2013-02-26 (×3): qty 1

## 2013-02-26 NOTE — Progress Notes (Addendum)
Clinical Social Work  CSW spoke with patient at bedside who reports he and dtr have discussed bed offers but asked that patient to contact dtr. CSW spoke with dtr via phone who reports that patient prefers private room and has chosen [facility]. Per chart review, patient possible DC within the next few days. CSW called [facility] and left a message with admissions in order to inform them of family's choice. CSW will continue to follow.  [place], [place] [phone]  [id number] Per MD patient not medically stable to DC, but hopeful for DC tomorrow. CSW updated facility on DC plans.
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
## 2013-02-27 DIAGNOSIS — E875 Hyperkalemia: Secondary | ICD-10-CM | POA: Diagnosis present

## 2013-02-27 DIAGNOSIS — L89629 Pressure ulcer of left heel, unspecified stage: Secondary | ICD-10-CM | POA: Diagnosis present

## 2013-02-27 DIAGNOSIS — S90829A Blister (nonthermal), unspecified foot, initial encounter: Secondary | ICD-10-CM | POA: Diagnosis present

## 2013-02-27 DIAGNOSIS — R532 Functional quadriplegia: Secondary | ICD-10-CM | POA: Diagnosis present

## 2013-02-27 LAB — BASIC METABOLIC PANEL
BUN: 38 mg/dL — ABNORMAL HIGH (ref 6–23)
CALCIUM: 8.9 mg/dL (ref 8.4–10.5)
CO2: 24 mEq/L (ref 19–32)
Chloride: 108 mEq/L (ref 96–112)
Creatinine, Ser: 1.07 mg/dL (ref 0.50–1.35)
GFR, EST AFRICAN AMERICAN: 81 mL/min — AB (ref >90)
GFR, EST NON AFRICAN AMERICAN: 70 mL/min — AB (ref >90)
Glucose, Bld: 107 mg/dL — ABNORMAL HIGH (ref 70–99)
Potassium: 6 mEq/L — ABNORMAL HIGH (ref 3.7–5.3)
Sodium: 140 mEq/L (ref 137–147)

## 2013-02-27 LAB — GLUCOSE, CAPILLARY: Glucose-Capillary: 102 mg/dL — ABNORMAL HIGH (ref 70–99)

## 2013-02-27 LAB — POTASSIUM: Potassium: 6 mEq/L — ABNORMAL HIGH (ref 3.7–5.3)

## 2013-02-27 MED ORDER — SODIUM POLYSTYRENE SULFONATE 15 GM/60ML PO SUSP
30.0000 g | Freq: Once | ORAL | Status: AC
  Administered 2013-02-27: 30 g via ORAL
  Filled 2013-02-27: qty 120

## 2013-02-27 MED ORDER — SODIUM POLYSTYRENE SULFONATE 15 GM/60ML PO SUSP
45.0000 g | Freq: Once | ORAL | Status: DC
  Filled 2013-02-27: qty 180

## 2013-02-27 MED ORDER — SODIUM POLYSTYRENE SULFONATE 15 GM/60ML PO SUSP
45.0000 g | Freq: Once | ORAL | Status: AC
  Administered 2013-02-27: 45 g via ORAL
  Filled 2013-02-27: qty 180

## 2013-02-27 MED ORDER — FLUDROCORTISONE ACETATE 0.1 MG PO TABS
0.1000 mg | ORAL_TABLET | Freq: Every day | ORAL | Status: DC
Start: 2013-02-27 — End: 2013-02-27

## 2013-02-27 MED ORDER — FLUDROCORTISONE ACETATE 0.1 MG PO TABS
0.1000 mg | ORAL_TABLET | Freq: Every day | ORAL | Status: DC
  Administered 2013-02-27 – 2013-02-28 (×2): 0.1 mg via ORAL
  Filled 2013-02-27 (×2): qty 1

## 2013-02-27 MED ORDER — SODIUM POLYSTYRENE SULFONATE 15 GM/60ML PO SUSP: 15.0000 g | Freq: Once | ORAL | Status: DC

## 2013-02-27 NOTE — Progress Notes (Signed)
Clinical Social Work  MD reports patient is not medically stable for DC today. CSW faxed preliminary DC summary to [facility] in case patient is stable for weekend DC. CSW will alert weekend CSW to contact facility at [phone] if patient is medically stable over the weekend or contact Suanne Marker in admissions at [phone] with any problems at SNF.  CSW will continue to follow.  [place], [place] [phone]
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
## 2013-02-28 LAB — BASIC METABOLIC PANEL
BUN: 33 mg/dL — AB (ref 6–23)
CO2: 25 mEq/L (ref 19–32)
Calcium: 8.8 mg/dL (ref 8.4–10.5)
Chloride: 107 mEq/L (ref 96–112)
Creatinine, Ser: 0.85 mg/dL (ref 0.50–1.35)
GFR, EST NON AFRICAN AMERICAN: 88 mL/min — AB (ref >90)
Glucose, Bld: 110 mg/dL — ABNORMAL HIGH (ref 70–99)
POTASSIUM: 3.7 meq/L (ref 3.7–5.3)
SODIUM: 143 meq/L (ref 137–147)

## 2013-02-28 MED ORDER — FLUDROCORTISONE ACETATE 0.1 MG PO TABS: 0.1000 mg | ORAL_TABLET | Freq: Every day | ORAL | Status: AC

## 2013-02-28 MED ORDER — ENSURE COMPLETE PO LIQD: 237.0000 mL | Freq: Three times a day (TID) | ORAL | Status: DC

## 2013-02-28 MED ORDER — SILVER SULFADIAZINE 1 % EX CREA: TOPICAL_CREAM | Freq: Every day | CUTANEOUS | Status: DC

## 2013-02-28 MED ORDER — ADULT MULTIVITAMIN W/MINERALS CH: 1.0000 | ORAL_TABLET | Freq: Every day | ORAL | Status: DC

## 2013-02-28 MED ORDER — ACETAMINOPHEN 325 MG PO TABS: 650.0000 mg | ORAL_TABLET | ORAL | Status: DC | PRN
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
## 2013-02-28 NOTE — Progress Notes (Signed)
Report called and given to Legrand Rams, lpn at [facility] facility. All questions answered to nurse's satisfaction. Vwilliams,rn.

## 2013-02-28 NOTE — Progress Notes (Signed)
Per MD, Pt ready for d/c.  Notified RN, Pt, family and facility.  Confirmed receipt of d/c summary, as admission arranged by [facility].  Facility ready to receive Pt.  Arranged for transportation.  Bernita Raisin, [place] [facility] [phone]

## 2013-02-28 NOTE — Progress Notes (Signed)
Pt discharged to [place] facility. Left unit on stretcher pushed by ambulance [facility]) personnel. Left in good condition. No problems reported prior to leaving. Vwilliams,rn.

## 2013-03-02 ENCOUNTER — Other Ambulatory Visit: Payer: Self-pay | Admitting: *Deleted

## 2013-03-02 ENCOUNTER — Non-Acute Institutional Stay (SKILLED_NURSING_FACILITY): Payer: Medicare Other | Admitting: Internal Medicine

## 2013-03-02 DIAGNOSIS — E875 Hyperkalemia: Secondary | ICD-10-CM

## 2013-03-02 DIAGNOSIS — S90829A Blister (nonthermal), unspecified foot, initial encounter: Secondary | ICD-10-CM

## 2013-03-02 DIAGNOSIS — L97319 Non-pressure chronic ulcer of right ankle with unspecified severity: Secondary | ICD-10-CM

## 2013-03-02 DIAGNOSIS — IMO0002 Reserved for concepts with insufficient information to code with codable children: Secondary | ICD-10-CM

## 2013-03-02 DIAGNOSIS — L97309 Non-pressure chronic ulcer of unspecified ankle with unspecified severity: Secondary | ICD-10-CM

## 2013-03-02 DIAGNOSIS — R532 Functional quadriplegia: Secondary | ICD-10-CM

## 2013-03-02 MED ORDER — TRAMADOL HCL 50 MG PO TABS: ORAL_TABLET | ORAL | Status: DC

## 2013-03-02 NOTE — Telephone Encounter (Signed)
[facility] 

## 2013-03-02 NOTE — Telephone Encounter (Signed)
[facility] 

## 2013-03-02 NOTE — Telephone Encounter (Signed)
[facility] 

## 2013-03-03 ENCOUNTER — Encounter: Payer: Self-pay | Admitting: Internal Medicine
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
## 2013-03-03 NOTE — Assessment & Plan Note (Signed)
No treatment or abx recommended

## 2013-03-03 NOTE — Assessment & Plan Note (Signed)
dEVELPED AT [place] TREATED;FELT TO BE SEC TO HEPARIN USED FOR dvt PROPHYLAXIS

## 2013-03-03 NOTE — Assessment & Plan Note (Signed)
Seen by Dr Sharol Given; no tx or antibiotics rec
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
## 2013-03-09 ENCOUNTER — Encounter: Payer: Self-pay | Admitting: Internal Medicine

## 2013-03-09 ENCOUNTER — Non-Acute Institutional Stay (SKILLED_NURSING_FACILITY): Payer: Medicare Other | Admitting: Internal Medicine

## 2013-03-09 DIAGNOSIS — S90829A Blister (nonthermal), unspecified foot, initial encounter: Secondary | ICD-10-CM

## 2013-03-09 DIAGNOSIS — IMO0002 Reserved for concepts with insufficient information to code with codable children: Secondary | ICD-10-CM

## 2013-03-09 DIAGNOSIS — L8990 Pressure ulcer of unspecified site, unspecified stage: Secondary | ICD-10-CM
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
## 2013-03-09 NOTE — Assessment & Plan Note (Addendum)
TWO ULCERS- #1 R lateral shin -linear, stage 2 healing  #2 sacrum smaller today, clean stage 2 - continue same managment

## 2013-03-09 NOTE — Assessment & Plan Note (Signed)
Stage 2, improved with current care

## 2013-03-17 ENCOUNTER — Non-Acute Institutional Stay (SKILLED_NURSING_FACILITY): Payer: Medicare Other | Admitting: Nurse Practitioner

## 2013-03-17 DIAGNOSIS — R532 Functional quadriplegia: Secondary | ICD-10-CM

## 2013-03-17 DIAGNOSIS — E875 Hyperkalemia: Secondary | ICD-10-CM

## 2013-03-17 DIAGNOSIS — L8990 Pressure ulcer of unspecified site, unspecified stage: Secondary | ICD-10-CM

## 2013-03-17 DIAGNOSIS — D649 Anemia, unspecified: Secondary | ICD-10-CM | POA: Insufficient documentation
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
## 2013-03-20 ENCOUNTER — Non-Acute Institutional Stay (SKILLED_NURSING_FACILITY): Payer: Medicare Other | Admitting: Nurse Practitioner

## 2013-03-20 DIAGNOSIS — E875 Hyperkalemia: Secondary | ICD-10-CM

## 2013-03-20 DIAGNOSIS — R532 Functional quadriplegia: Secondary | ICD-10-CM

## 2013-03-20 DIAGNOSIS — L8990 Pressure ulcer of unspecified site, unspecified stage: Secondary | ICD-10-CM

## 2013-03-20 DIAGNOSIS — D649 Anemia, unspecified: Secondary | ICD-10-CM
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
## 2013-03-20 NOTE — Progress Notes (Signed)
Patient ID: [name] [name], male   DOB: 08/06/1945, 68 y.o.   MRN: [id number] Pt will NOT discharge at this time due to elevated potassium level that has just resulted from lab (potassium of 6.5 that had been repeated and verified no hemolysis noted) Will give 30 gm of kayexalate now; and get STAT BMP in am  Will dc lisinopril  Family agrees to this but does not want pt to stay any longer than he has to do to financial reasons.  On call nurse practitioner made aware of situation and that if labs come back wnl pt may dc home tomorrow (pts family may take him any ways) (which is Saturday) and follow up with PCP Monday am (appt already scheduled)

## 2013-03-28 DIAGNOSIS — D649 Anemia, unspecified: Secondary | ICD-10-CM

## 2013-03-28 DIAGNOSIS — IMO0002 Reserved for concepts with insufficient information to code with codable children: Secondary | ICD-10-CM

## 2013-03-28 DIAGNOSIS — R532 Functional quadriplegia: Secondary | ICD-10-CM

## 2013-03-28 DIAGNOSIS — E119 Type 2 diabetes mellitus without complications: Secondary | ICD-10-CM

## 2013-03-28 DIAGNOSIS — L89509 Pressure ulcer of unspecified ankle, unspecified stage: Secondary | ICD-10-CM

## 2013-03-28 DIAGNOSIS — L8993 Pressure ulcer of unspecified site, stage 3: Secondary | ICD-10-CM

## 2013-05-07 ENCOUNTER — Other Ambulatory Visit: Payer: Self-pay | Admitting: Nurse Practitioner

## 2015-04-07 ENCOUNTER — Observation Stay (HOSPITAL_COMMUNITY)
Admission: EM | Admit: 2015-04-07 | Discharge: 2015-04-09 | Disposition: A | Discharge status: Home / Self Care | Payer: Medicare Other | Attending: Internal Medicine | Admitting: Internal Medicine

## 2015-04-07 ENCOUNTER — Encounter (HOSPITAL_COMMUNITY): Payer: Self-pay

## 2015-04-07 DIAGNOSIS — Z79899 Other long term (current) drug therapy: Secondary | ICD-10-CM | POA: Diagnosis not present

## 2015-04-07 DIAGNOSIS — I10 Essential (primary) hypertension: Secondary | ICD-10-CM | POA: Diagnosis not present

## 2015-04-07 DIAGNOSIS — F329 Major depressive disorder, single episode, unspecified: Secondary | ICD-10-CM | POA: Diagnosis present

## 2015-04-07 DIAGNOSIS — D509 Iron deficiency anemia, unspecified: Secondary | ICD-10-CM | POA: Diagnosis not present

## 2015-04-07 DIAGNOSIS — E119 Type 2 diabetes mellitus without complications: Secondary | ICD-10-CM | POA: Diagnosis not present

## 2015-04-07 DIAGNOSIS — E875 Hyperkalemia: Principal | ICD-10-CM | POA: Diagnosis present

## 2015-04-07 DIAGNOSIS — Z7984 Long term (current) use of oral hypoglycemic drugs: Secondary | ICD-10-CM | POA: Insufficient documentation

## 2015-04-07 DIAGNOSIS — E785 Hyperlipidemia, unspecified: Secondary | ICD-10-CM | POA: Diagnosis present

## 2015-04-07 DIAGNOSIS — L89629 Pressure ulcer of left heel, unspecified stage: Secondary | ICD-10-CM | POA: Diagnosis present

## 2015-04-07 DIAGNOSIS — R7989 Other specified abnormal findings of blood chemistry: Secondary | ICD-10-CM | POA: Diagnosis present

## 2015-04-07 DIAGNOSIS — L89623 Pressure ulcer of left heel, stage 3: Secondary | ICD-10-CM

## 2015-04-07 DIAGNOSIS — F32A Depression, unspecified: Secondary | ICD-10-CM | POA: Diagnosis present

## 2015-04-07 HISTORY — DX: Hyperlipidemia, unspecified: E78.5

## 2015-04-07 HISTORY — DX: Iron deficiency anemia, unspecified: D50.9

## 2015-04-07 HISTORY — DX: Essential (primary) hypertension: I10

## 2015-04-07 HISTORY — DX: Type 2 diabetes mellitus without complications: E11.9

## 2015-04-07 LAB — CBC WITH DIFFERENTIAL/PLATELET
Basophils Absolute: 0 10*3/uL (ref 0.0–0.1)
Basophils Relative: 0 %
EOS ABS: 0 10*3/uL (ref 0.0–0.7)
Eosinophils Relative: 0 %
HCT: 32.5 % — ABNORMAL LOW (ref 39.0–52.0)
HEMOGLOBIN: 10.4 g/dL — AB (ref 13.0–17.0)
LYMPHS PCT: 12 %
Lymphs Abs: 1.1 10*3/uL (ref 0.7–4.0)
MCH: 30.8 pg (ref 26.0–34.0)
MCHC: 32 g/dL (ref 30.0–36.0)
MCV: 96.2 fL (ref 78.0–100.0)
MONOS PCT: 4 %
Monocytes Absolute: 0.4 10*3/uL (ref 0.1–1.0)
Neutro Abs: 8.2 10*3/uL — ABNORMAL HIGH (ref 1.7–7.7)
Neutrophils Relative %: 84 %
Platelets: 325 10*3/uL (ref 150–400)
RBC: 3.38 MIL/uL — AB (ref 4.22–5.81)
RDW: 14.5 % (ref 11.5–15.5)
WBC: 9.8 10*3/uL (ref 4.0–10.5)

## 2015-04-07 LAB — COMPREHENSIVE METABOLIC PANEL
ALK PHOS: 65 U/L (ref 38–126)
ALT: 8 U/L — AB (ref 17–63)
ANION GAP: 10 (ref 5–15)
AST: 17 U/L (ref 15–41)
Albumin: 3.6 g/dL (ref 3.5–5.0)
BUN: 36 mg/dL — ABNORMAL HIGH (ref 6–20)
CO2: 18 mmol/L — ABNORMAL LOW (ref 22–32)
CREATININE: 1.14 mg/dL (ref 0.61–1.24)
Calcium: 9.5 mg/dL (ref 8.9–10.3)
Chloride: 111 mmol/L (ref 101–111)
GFR calc Af Amer: >60 mL/min (ref >60)
Glucose, Bld: 162 mg/dL — ABNORMAL HIGH (ref 65–99)
Potassium: 6.8 mmol/L (ref 3.5–5.1)
Sodium: 139 mmol/L (ref 135–145)
TOTAL PROTEIN: 7.4 g/dL (ref 6.5–8.1)
Total Bilirubin: 0.8 mg/dL (ref 0.3–1.2)

## 2015-04-07 LAB — MAGNESIUM: MAGNESIUM: 1.7 mg/dL (ref 1.7–2.4)

## 2015-04-07 MED ORDER — SODIUM POLYSTYRENE SULFONATE 15 GM/60ML PO SUSP
30.0000 g | Freq: Once | ORAL | Status: AC
  Administered 2015-04-08: 30 g via ORAL
  Filled 2015-04-07: qty 120

## 2015-04-07 MED ORDER — DEXTROSE 50 % IV SOLN
50.0000 mL | Freq: Once | INTRAVENOUS | Status: AC
Start: 2015-04-07 — End: 2015-04-08
  Administered 2015-04-08: 50 mL via INTRAVENOUS
  Filled 2015-04-07: qty 50

## 2015-04-07 MED ORDER — INSULIN ASPART 100 UNIT/ML IV SOLN
10.0000 [IU] | Freq: Once | INTRAVENOUS | Status: AC
  Administered 2015-04-08: 10 [IU] via INTRAVENOUS
  Filled 2015-04-07: qty 1

## 2015-04-07 MED ORDER — SODIUM CHLORIDE 0.9 % IV SOLN
INTRAVENOUS | Status: AC
  Administered 2015-04-08: 01:00:00 via INTRAVENOUS

## 2015-04-07 MED ORDER — SODIUM CHLORIDE 0.9 % IV BOLUS (SEPSIS)
500.0000 mL | Freq: Once | INTRAVENOUS | Status: AC
  Administered 2015-04-07: 500 mL via INTRAVENOUS

## 2015-04-07 MED ORDER — SODIUM CHLORIDE 0.9 % IV BOLUS (SEPSIS)
500.0000 mL | Freq: Once | INTRAVENOUS | Status: AC
  Administered 2015-04-08: 500 mL via INTRAVENOUS
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
## 2015-04-07 NOTE — ED Notes (Signed)
Pt arrived via EMS c/o K+ 6.9 at PCP office today.

## 2015-04-07 NOTE — ED Notes (Signed)
Attempted IV without success.

## 2015-04-07 NOTE — ED Notes (Signed)
IV team at the bedside. 
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
## 2015-04-08 ENCOUNTER — Encounter (HOSPITAL_COMMUNITY): Payer: Self-pay | Admitting: Internal Medicine

## 2015-04-08 DIAGNOSIS — F32A Depression, unspecified: Secondary | ICD-10-CM | POA: Diagnosis present

## 2015-04-08 DIAGNOSIS — F329 Major depressive disorder, single episode, unspecified: Secondary | ICD-10-CM | POA: Diagnosis present

## 2015-04-08 DIAGNOSIS — E875 Hyperkalemia: Secondary | ICD-10-CM | POA: Diagnosis not present

## 2015-04-08 LAB — GLUCOSE, CAPILLARY
GLUCOSE-CAPILLARY: 166 mg/dL — AB (ref 65–99)
GLUCOSE-CAPILLARY: 155 mg/dL — AB (ref 65–99)
GLUCOSE-CAPILLARY: 141 mg/dL — AB (ref 65–99)
Glucose-Capillary: 158 mg/dL — ABNORMAL HIGH (ref 65–99)
Glucose-Capillary: 195 mg/dL — ABNORMAL HIGH (ref 65–99)
Glucose-Capillary: 200 mg/dL — ABNORMAL HIGH (ref 65–99)

## 2015-04-08 LAB — BASIC METABOLIC PANEL
ANION GAP: 12 (ref 5–15)
Anion gap: 10 (ref 5–15)
BUN: 34 mg/dL — ABNORMAL HIGH (ref 6–20)
BUN: 29 mg/dL — AB (ref 6–20)
CALCIUM: 9.4 mg/dL (ref 8.9–10.3)
CO2: 16 mmol/L — ABNORMAL LOW (ref 22–32)
CO2: 18 mmol/L — ABNORMAL LOW (ref 22–32)
CREATININE: 1.05 mg/dL (ref 0.61–1.24)
CREATININE: 0.91 mg/dL (ref 0.61–1.24)
Calcium: 8.8 mg/dL — ABNORMAL LOW (ref 8.9–10.3)
Chloride: 113 mmol/L — ABNORMAL HIGH (ref 101–111)
Chloride: 112 mmol/L — ABNORMAL HIGH (ref 101–111)
GFR calc Af Amer: >60 mL/min (ref >60)
GFR calc Af Amer: >60 mL/min (ref >60)
GLUCOSE: 144 mg/dL — AB (ref 65–99)
Glucose, Bld: 224 mg/dL — ABNORMAL HIGH (ref 65–99)
Potassium: 4.9 mmol/L (ref 3.5–5.1)
Potassium: 3.9 mmol/L (ref 3.5–5.1)
SODIUM: 141 mmol/L (ref 135–145)
SODIUM: 140 mmol/L (ref 135–145)

## 2015-04-08 LAB — LIPID PANEL
CHOL/HDL RATIO: 3.1 ratio
Cholesterol: 109 mg/dL (ref 0–200)
HDL: 35 mg/dL — AB (ref >40)
LDL Cholesterol: 53 mg/dL (ref 0–99)
TRIGLYCERIDES: 107 mg/dL (ref <150)
VLDL: 21 mg/dL (ref 0–40)

## 2015-04-08 MED ORDER — SODIUM POLYSTYRENE SULFONATE 15 GM/60ML PO SUSP: 30.0000 g | Freq: Three times a day (TID) | ORAL | Status: DC

## 2015-04-08 MED ORDER — INSULIN ASPART 100 UNIT/ML ~~LOC~~ SOLN: 0.0000 [IU] | Freq: Every day | SUBCUTANEOUS | Status: DC

## 2015-04-08 MED ORDER — HYDRALAZINE HCL 20 MG/ML IJ SOLN: 5.0000 mg | INTRAMUSCULAR | Status: DC | PRN

## 2015-04-08 MED ORDER — INSULIN ASPART 100 UNIT/ML ~~LOC~~ SOLN
0.0000 [IU] | Freq: Three times a day (TID) | SUBCUTANEOUS | Status: DC
  Administered 2015-04-08 – 2015-04-09 (×4): 2 [IU] via SUBCUTANEOUS
  Administered 2015-04-09: 1 [IU] via SUBCUTANEOUS

## 2015-04-08 MED ORDER — ACETAMINOPHEN 325 MG PO TABS: 650.0000 mg | ORAL_TABLET | Freq: Four times a day (QID) | ORAL | Status: DC | PRN

## 2015-04-08 MED ORDER — ACETAMINOPHEN 650 MG RE SUPP: 650.0000 mg | Freq: Four times a day (QID) | RECTAL | Status: DC | PRN

## 2015-04-08 MED ORDER — FERROUS SULFATE 325 (65 FE) MG PO TABS
325.0000 mg | ORAL_TABLET | Freq: Two times a day (BID) | ORAL | Status: DC
  Administered 2015-04-08 – 2015-04-09 (×4): 325 mg via ORAL
  Filled 2015-04-08 (×4): qty 1

## 2015-04-08 MED ORDER — TRAMADOL HCL 50 MG PO TABS
50.0000 mg | ORAL_TABLET | Freq: Two times a day (BID) | ORAL | Status: DC
  Administered 2015-04-08 – 2015-04-09 (×4): 50 mg via ORAL
  Filled 2015-04-08 (×4): qty 1

## 2015-04-08 MED ORDER — ONDANSETRON HCL 4 MG PO TABS: 4.0000 mg | ORAL_TABLET | Freq: Four times a day (QID) | ORAL | Status: DC | PRN

## 2015-04-08 MED ORDER — ONDANSETRON HCL 4 MG/2ML IJ SOLN: 4.0000 mg | Freq: Four times a day (QID) | INTRAMUSCULAR | Status: DC | PRN

## 2015-04-08 MED ORDER — PRAVASTATIN SODIUM 20 MG PO TABS
20.0000 mg | ORAL_TABLET | Freq: Every day | ORAL | Status: DC
  Administered 2015-04-08 – 2015-04-09 (×2): 20 mg via ORAL
  Filled 2015-04-08 (×2): qty 1

## 2015-04-08 MED ORDER — SODIUM POLYSTYRENE SULFONATE 15 GM/60ML PO SUSP
30.0000 g | ORAL | Status: AC
  Administered 2015-04-08: 30 g via ORAL
  Filled 2015-04-08: qty 120

## 2015-04-08 MED ORDER — SODIUM CHLORIDE 0.9% FLUSH
3.0000 mL | Freq: Two times a day (BID) | INTRAVENOUS | Status: DC
  Administered 2015-04-08 (×3): 3 mL via INTRAVENOUS

## 2015-04-08 MED ORDER — ENOXAPARIN SODIUM 40 MG/0.4ML ~~LOC~~ SOLN
40.0000 mg | SUBCUTANEOUS | Status: DC
  Administered 2015-04-08 – 2015-04-09 (×2): 40 mg via SUBCUTANEOUS
  Filled 2015-04-08 (×2): qty 0.4

## 2015-04-08 MED ORDER — ESCITALOPRAM OXALATE 10 MG PO TABS
10.0000 mg | ORAL_TABLET | Freq: Every day | ORAL | Status: DC
  Administered 2015-04-08 – 2015-04-09 (×2): 10 mg via ORAL
  Filled 2015-04-08 (×2): qty 1

## 2015-04-08 NOTE — Progress Notes (Signed)
[facility] PROGRESS NOTE  [name] [name] [id number] DOB: Aug 28, 1945 DOA: 04/07/2015 PCP: Beckie Salts, MD  Assessment/Plan: 1. HyperKalemia 2. htn 3. 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 t2dm 5. Dyslipidemia 6. Iron deficiency anemia  1. Cv. Patient noted to be hypotensive this am with blood 90 systolic, will continue to hold on blood pressure agents, lisinopril, will continue to monitor telemetry. EKg personally reviewed noted no interval or segment prolonagation. Had bolus of saline for hypotension. Will continue to use hydralazine as needed IV for blood pressure control.   2. Pulmonary. Will continue to monitor oxymetry. No signs of volume overload.  3. Nephrology. Will continue to monitor renal function, improve K down to 4.9, will give one dose more of kayexalate and follow bmp in pm. Continue to hold lisinopril, avoid potassium supplements. Need to educate family about potassium supplementation.  4. Endocrine will continue glucose cover and monitorizatio, patient tolerating po well. Serum glucose [phone].   5. Neurology. Will continue escitalopram per home regimen.  Will plan to discharge home in am if blood pressure and potassium normalized.  Code Status: full code Family Communication: I spoke with patient about his condition and plan of care. Disposition Plan: home   Consultants:  None  Procedures:  none  Antibiotics: None  HPI/Subjective: Patient feeling well, no dyspnea or chest pain. No abdominal pain. Positive diarrhea. Home care per patient's daughter.   Objective: Filed Vitals:   04/08/15 0120 04/08/15 0509  BP: 107/75 94/63  Pulse: 108 60  Temp: 97.8 F (36.6 C) 98.5 F (36.9 C)  Resp: 16 16    Intake/Output Summary (Last 24 hours) at 04/08/15 0936 Last data filed at 04/08/15 0118  Gross per 24 hour  Intake    120 ml  Output      0 ml  Net    120 ml   Filed Weights   04/08/15 0120  Weight: 50.1 kg (110 lb 7.2 oz)     Exam:   General:  Not in pain or dyspnea.  Cardiovascular: Heart s1-s2 present, no gallops, rubs or murumrs. No JVD or lower exr. edema  Respiratory: Lungs clear to auscultation, no wheezing, or rhonchi.   Abdomen: soft and non-tender, no organomegaly  Musculoskeletal: Contracture upper and lower ext.   Data Reviewed: Basic Metabolic Panel:  Recent Labs Lab 04/07/15 2238 04/08/15 0554  NA 139 141  K 6.8* 4.9  CL 111 113*  CO2 18* 16*  GLUCOSE 162* 224*  BUN 36* 34*  CREATININE 1.14 1.05  CALCIUM 9.5 9.4  MG 1.7  --    Liver Function Tests:  Recent Labs Lab 04/07/15 2238  AST 17  ALT 8*  ALKPHOS 65  BILITOT 0.8  PROT 7.4  ALBUMIN 3.6   No results for input(s): LIPASE, AMYLASE in the last 168 hours. No results for input(s): AMMONIA in the last 168 hours. CBC:  Recent Labs Lab 04/07/15 2238  WBC 9.8  NEUTROABS 8.2*  HGB 10.4*  HCT 32.5*  MCV 96.2  PLT 325   Cardiac Enzymes: No results for input(s): CKTOTAL, CKMB, CKMBINDEX, TROPONINI in the last 168 hours. BNP (last 3 results) No results for input(s): BNP in the last 8760 hours.  ProBNP (last 3 results) No results for input(s): PROBNP in the last 8760 hours.  CBG:  Recent Labs Lab 04/08/15 0129  GLUCAP 158*    No results found for this or any previous visit (from the past 240 hour(s)).   Studies: No results found.  Scheduled Meds: .  sodium chloride   Intravenous STAT  . enoxaparin (LOVENOX) injection  40 mg Subcutaneous Q24H  . escitalopram  10 mg Oral Daily  . ferrous sulfate  325 mg Oral BID WC  . insulin aspart  0-5 Units Subcutaneous QHS  . insulin aspart  0-9 Units Subcutaneous TID WC  . pravastatin  20 mg Oral Daily  . sodium chloride flush  3 mL Intravenous Q12H  . traMADol  50 mg Oral Q12H   Continuous Infusions:   Principal Problem:   Hyperkalemia Active Problems:   Pressure ulcer of left heel   Essential hypertension   Diabetes mellitus without complication  (HCC)   HLD (hyperlipidemia)   Iron deficiency anemia   Depression       [place]  [facility] . If 7PM-7AM, please contact night-coverage at [email or web address], password [facility] 04/08/2015, 9:36 AM

## 2015-04-08 NOTE — Progress Notes (Addendum)
New Admission Note  Arrival: Via stretcher Mental Orientation: Alert oriented X 4. Poor Judgement. Telemetry: On tele Skin: Left foot pressure ulcer and abrasion lower exteremities  verified with Joaquim Lai. IV: Left forearm IV saline locked Pain: c/o of pain meds given Safety measures:  verbalized understanding. Bed in lowest position. Yellow bracelet on. Non-skid socks on. Bed alarm on. Family:  No family at bedside. Orders have been reviewed and implemented. Will continue to monitor.

## 2015-04-08 NOTE — Care Management Obs Status (Signed)
[place] NOTIFICATION   Patient Details  Name: [name] [name] MRN: [id number] Date of Birth: 04-Nov-1945   Medicare Observation Status Notification Given:  Yes    Otoniel Myhand, Rory Percy, RN 04/08/2015, 1:16 PM

## 2015-04-08 NOTE — Care Management Obs Status (Signed)
[place] NOTIFICATION   Patient Details  Name: Locklan Reilley MRN: [id number] Date of Birth: 1945-12-09   Medicare Observation Status Notification Given:  Yes    Leeyah Heather, Rory Percy, RN 04/08/2015, 1:15 PM

## 2015-04-09 DIAGNOSIS — E875 Hyperkalemia: Secondary | ICD-10-CM

## 2015-04-09 LAB — GLUCOSE, CAPILLARY
GLUCOSE-CAPILLARY: 186 mg/dL — AB (ref 65–99)
Glucose-Capillary: 87 mg/dL (ref 65–99)
Glucose-Capillary: 147 mg/dL — ABNORMAL HIGH (ref 65–99)

## 2015-04-09 LAB — BASIC METABOLIC PANEL
Anion gap: 8 (ref 5–15)
BUN: 23 mg/dL — AB (ref 6–20)
CHLORIDE: 110 mmol/L (ref 101–111)
CO2: 22 mmol/L (ref 22–32)
Calcium: 8.6 mg/dL — ABNORMAL LOW (ref 8.9–10.3)
Creatinine, Ser: 0.74 mg/dL (ref 0.61–1.24)
GFR calc Af Amer: >60 mL/min (ref >60)
GFR calc non Af Amer: >60 mL/min (ref >60)
GLUCOSE: 90 mg/dL (ref 65–99)
POTASSIUM: 3.3 mmol/L — AB (ref 3.5–5.1)
Sodium: 140 mmol/L (ref 135–145)

## 2015-04-09 LAB — HEMOGLOBIN A1C
HEMOGLOBIN A1C: 5.6 % (ref 4.8–5.6)
MEAN PLASMA GLUCOSE: 114 mg/dL

## 2015-04-09 NOTE — Progress Notes (Signed)
Called SW to request patient be transported home via [place].  SW stated that they would get everything setup in the next hour.  Jillyn Ledger, MBA, BSN, RN

## 2015-04-09 NOTE — Progress Notes (Signed)
Called [facility] to request for transport.  Transport request had not been placed earlier.  Jillyn Ledger, MBA, BSN, RN
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
## 2016-08-23 ENCOUNTER — Emergency Department (HOSPITAL_COMMUNITY): Payer: Medicare Other

## 2016-08-23 ENCOUNTER — Emergency Department (HOSPITAL_COMMUNITY)
Admission: EM | Admit: 2016-08-23 | Discharge: 2016-08-24 | Disposition: A | Discharge status: Home / Self Care | Payer: Medicare Other | Attending: Emergency Medicine | Admitting: Emergency Medicine

## 2016-08-23 ENCOUNTER — Encounter (HOSPITAL_COMMUNITY): Payer: Self-pay | Admitting: Emergency Medicine

## 2016-08-23 DIAGNOSIS — Z7982 Long term (current) use of aspirin: Secondary | ICD-10-CM | POA: Diagnosis not present

## 2016-08-23 DIAGNOSIS — Z7984 Long term (current) use of oral hypoglycemic drugs: Secondary | ICD-10-CM | POA: Insufficient documentation

## 2016-08-23 DIAGNOSIS — E119 Type 2 diabetes mellitus without complications: Secondary | ICD-10-CM | POA: Insufficient documentation

## 2016-08-23 DIAGNOSIS — Z79899 Other long term (current) drug therapy: Secondary | ICD-10-CM | POA: Diagnosis not present

## 2016-08-23 DIAGNOSIS — L089 Local infection of the skin and subcutaneous tissue, unspecified: Secondary | ICD-10-CM | POA: Diagnosis not present

## 2016-08-23 DIAGNOSIS — Z7902 Long term (current) use of antithrombotics/antiplatelets: Secondary | ICD-10-CM | POA: Insufficient documentation

## 2016-08-23 DIAGNOSIS — I10 Essential (primary) hypertension: Secondary | ICD-10-CM | POA: Insufficient documentation

## 2016-08-23 DIAGNOSIS — L539 Erythematous condition, unspecified: Secondary | ICD-10-CM | POA: Diagnosis present

## 2016-08-23 IMAGING — CR DG HAND COMPLETE 3+V*R*
4 series · 4 of 4 positions shown · non-contrast
Comparison: None.

CLINICAL DATA: Skin infection

EXAM:
RIGHT HAND - COMPLETE 3+ VIEW

[x hand pa right (1 of 2)]
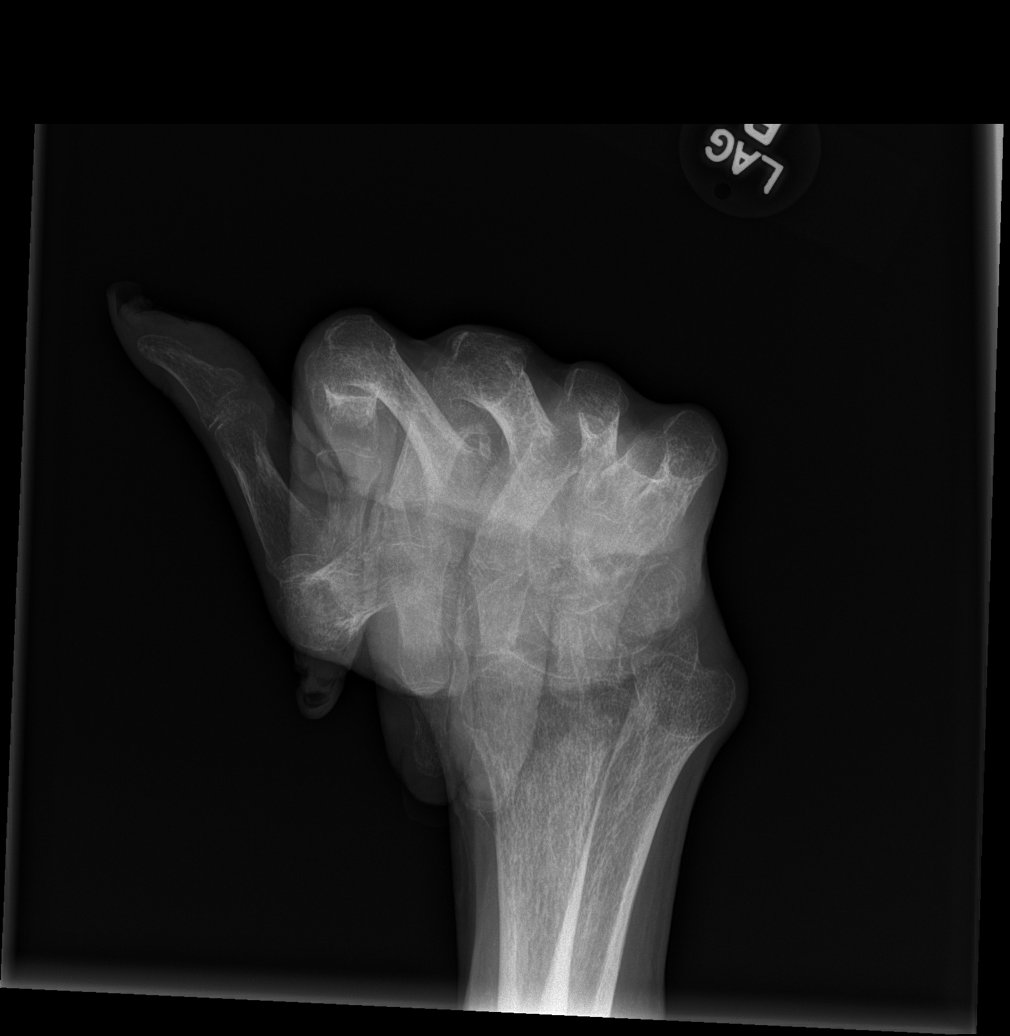

[x hand pa right (2 of 2)]
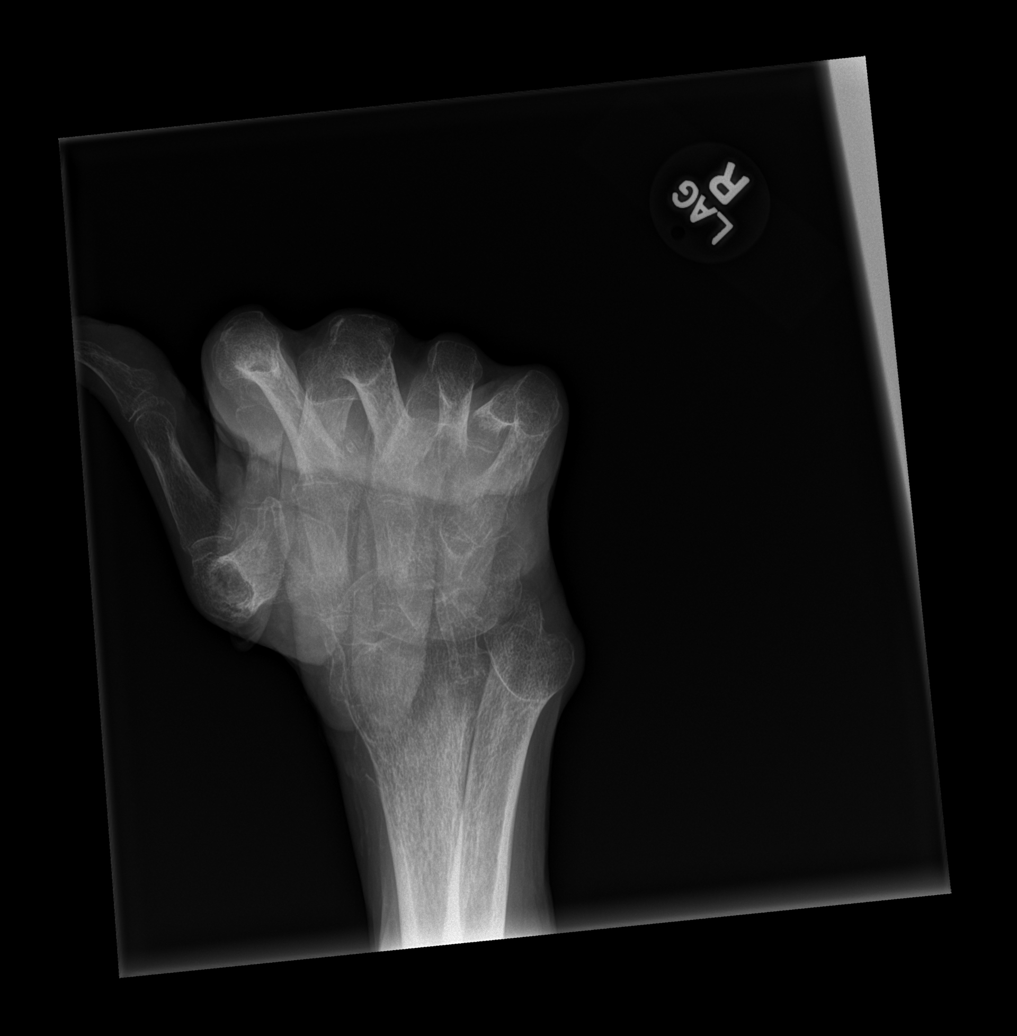

[x hand obl right]
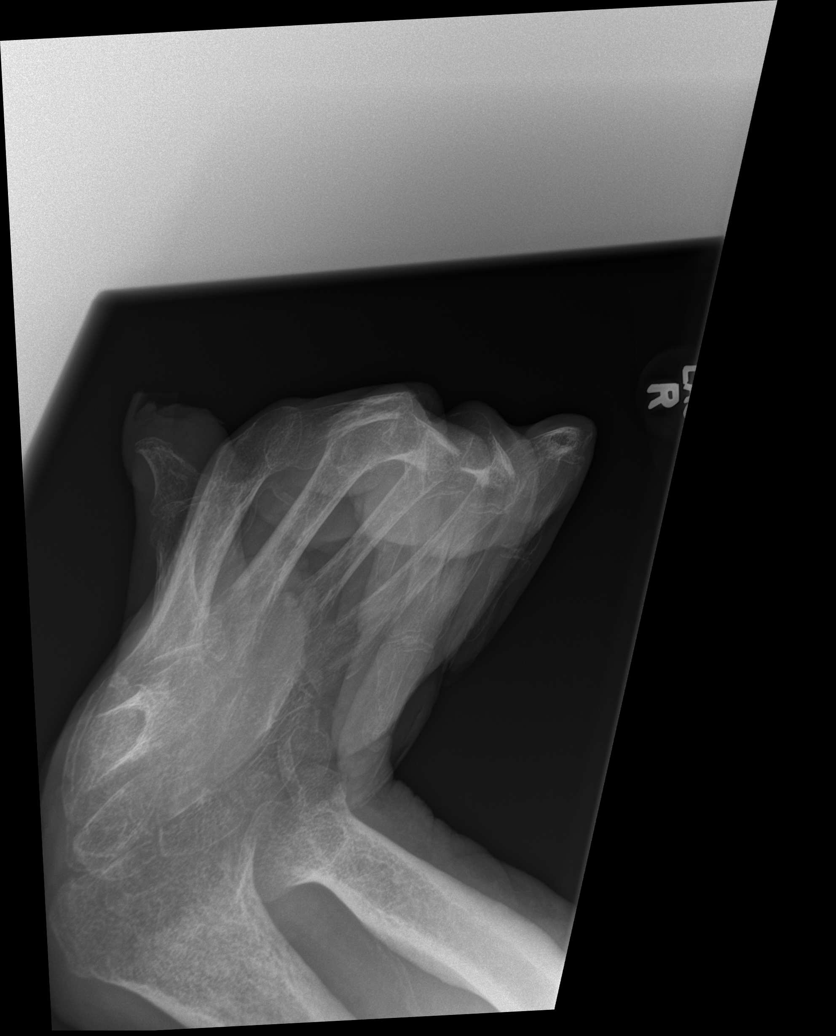

[x hand lat right]
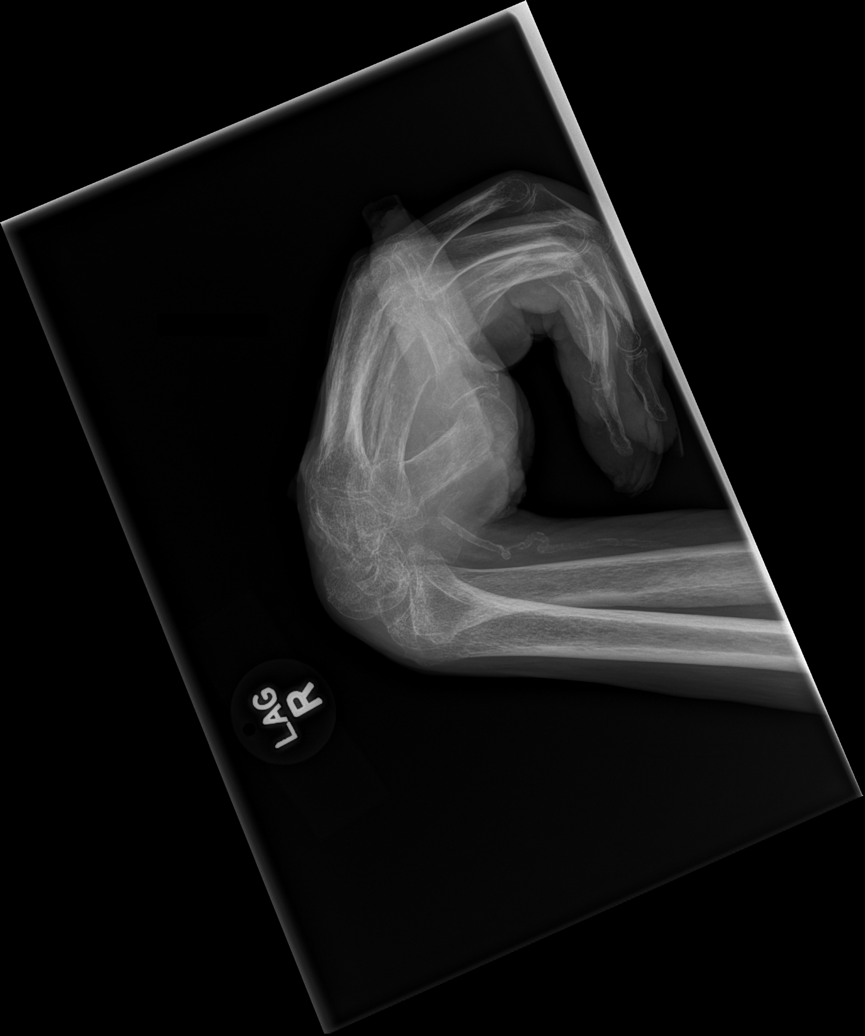

[4 of 4 positions shown; findings below may reference images not displayed]

FINDINGS: There is extreme plantar flexion at the wrist with contraction of
the metacarpophalangeal and proximal interphalangeal joints. The
bones are diffusely osteopenic. There is no acute fracture. No
osteolysis.
IMPRESSION: Chronic extreme plantar flexion at the wrist, metacarpophalangeal
joints and proximal interphalangeal joints. No evidence of active
osteomyelitis.

## 2016-08-23 MED ORDER — IBUPROFEN 200 MG PO TABS
600.0000 mg | ORAL_TABLET | Freq: Once | ORAL | Status: AC
  Administered 2016-08-23: 600 mg via ORAL
  Filled 2016-08-23: qty 3

## 2016-08-23 NOTE — ED Notes (Signed)
Writer attempted to draw blood, unsuccessful attempt X 3, RN notified.

## 2016-08-23 NOTE — ED Notes (Signed)
Pt was given chicken noodle soup, apple sauce and coke to drink. Pt has eaten all of the soup and 50% of applesauce, 50% of coke at this time.
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
## 2016-08-23 NOTE — ED Notes (Signed)
Stuck pt 1 time was unsuccessful RN Johnathan made aware

## 2016-08-23 NOTE — ED Notes (Signed)
Pt's daughter [name] [name] has left a contact number to her and her daughter, [name] [name] is leaving hospital at this time to go home.  [name] [name]: [phone] Tarria's daughter: [phone]  [name] [name] has stated she is going home to get something to eat and to wait on the pt to come home, the pt lives with her.
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
## 2016-08-23 NOTE — ED Notes (Signed)
Pt was stuck 1 time for labs=unsuccessful, phlebotomy contacted.

## 2016-08-24 DIAGNOSIS — L089 Local infection of the skin and subcutaneous tissue, unspecified: Secondary | ICD-10-CM | POA: Diagnosis not present

## 2016-08-24 MED ORDER — BACITRACIN ZINC 500 UNIT/GM EX OINT
TOPICAL_OINTMENT | Freq: Two times a day (BID) | CUTANEOUS | Status: DC
  Administered 2016-08-24: 3 via TOPICAL
  Filled 2016-08-24: qty 2.7

## 2016-08-24 MED ORDER — DOXYCYCLINE HYCLATE 100 MG PO TABS
100.0000 mg | ORAL_TABLET | Freq: Once | ORAL | Status: AC
  Administered 2016-08-24: 100 mg via ORAL
  Filled 2016-08-24: qty 1

## 2016-08-24 MED ORDER — DOXYCYCLINE HYCLATE 100 MG PO CAPS: 100.0000 mg | ORAL_CAPSULE | Freq: Two times a day (BID) | ORAL | 0 refills | Status: DC

## 2016-08-24 NOTE — Discharge Instructions (Signed)
Please take all of your antibiotics until finished!   You may develop abdominal discomfort or diarrhea from the antibiotic.  You may help offset this with probiotics which you can buy or get in yogurt. Do not eat  or take the probiotics until 2 hours after your antibiotic.   Follow up with your PCP ion 2 days for recheck. If you develop worsening or new concerning symptoms you can return to the emergency department for re-evaluation. See below for additional return precautions.   Cellulitis is a skin infection. The infected area is usually red and sore. This condition occurs most often in the arms and lower legs. It is very important to get treated for this condition. Follow these instructions at home: Take over-the-counter and prescription medicines only as told by your doctor. If you were prescribed an antibiotic medicine, take it as told by your doctor. Do not stop taking the antibiotic even if you start to feel better. Drink enough fluid to keep your pee (urine) clear or pale yellow. Do not touch or rub the infected area. Raise (elevate) the infected area above the level of your heart while you are sitting or lying down. Place warm or cold wet cloths (warm or cold compresses) on the infected area. Do this as told by your doctor. Keep all follow-up visits as told by your doctor. This is important. These visits let your doctor make sure your infection is not getting worse. Contact a doctor if: You have a fever. Your symptoms do not get better after 1-2 days of treatment. Your bone or joint under the infected area starts to hurt after the skin has healed. Your infection comes back. This can happen in the same area or another area. You have a swollen bump in the infected area. You have new symptoms. You feel ill and also have muscle aches and pains. Get help right away if: Your symptoms get worse. You feel very sleepy. You throw up (vomit) or have watery poop (diarrhea) for a long  time. There are red streaks coming from the infected area. Your red area gets larger. Your red area turns darker.

## 2016-08-24 NOTE — ED Notes (Signed)
Pt given clean items and bandages and instructed how to take care of hand. Pt verbalized understanding

## 2016-08-26 LAB — AEROBIC CULTURE W GRAM STAIN (SUPERFICIAL SPECIMEN)

## 2016-08-26 LAB — AEROBIC CULTURE  (SUPERFICIAL SPECIMEN)

## 2016-08-27 ENCOUNTER — Telehealth: Payer: Self-pay | Admitting: *Deleted

## 2016-08-27 NOTE — Telephone Encounter (Signed)
Post ED Visit - Positive Culture Follow-up  Culture report reviewed by antimicrobial stewardship pharmacist:  []  Elenor Quinones, Pharm.D. []  Heide Guile, Pharm.D., BCPS AQ-ID []  Parks Neptune, Pharm.D., BCPS []  Alycia Rossetti, Pharm.D., BCPS []  [place], Pharm.D., BCPS, AAHIVP []  Legrand Como, Pharm.D., BCPS, AAHIVP []  Salome Arnt, PharmD, BCPS []  Dimitri Ped, PharmD, BCPS []  Vincenza Hews, PharmD, BCPS Leeroy Cha , PharmD  Positive wound culture Treated with doxycycline, organism sensitive to the same and no further patient follow-up is required at this time.  Harlon Flor [facility] 08/27/2016, 10:24 AM

## 2017-09-24 ENCOUNTER — Encounter (HOSPITAL_COMMUNITY): Payer: Self-pay | Admitting: Emergency Medicine

## 2017-09-24 ENCOUNTER — Other Ambulatory Visit: Payer: Self-pay

## 2017-09-24 ENCOUNTER — Inpatient Hospital Stay (HOSPITAL_COMMUNITY)
Admission: EM | Admit: 2017-09-24 | Discharge: 2017-09-26 | DRG: 640 | Disposition: A | Discharge status: Home / Self Care | Payer: Medicare Other | Attending: Internal Medicine | Admitting: Internal Medicine

## 2017-09-24 DIAGNOSIS — L89153 Pressure ulcer of sacral region, stage 3: Secondary | ICD-10-CM | POA: Diagnosis present

## 2017-09-24 DIAGNOSIS — I96 Gangrene, not elsewhere classified: Secondary | ICD-10-CM | POA: Diagnosis present

## 2017-09-24 DIAGNOSIS — I10 Essential (primary) hypertension: Secondary | ICD-10-CM | POA: Diagnosis present

## 2017-09-24 DIAGNOSIS — E119 Type 2 diabetes mellitus without complications: Secondary | ICD-10-CM

## 2017-09-24 DIAGNOSIS — D509 Iron deficiency anemia, unspecified: Secondary | ICD-10-CM | POA: Diagnosis present

## 2017-09-24 DIAGNOSIS — F329 Major depressive disorder, single episode, unspecified: Secondary | ICD-10-CM | POA: Diagnosis present

## 2017-09-24 DIAGNOSIS — L8990 Pressure ulcer of unspecified site, unspecified stage: Secondary | ICD-10-CM | POA: Diagnosis present

## 2017-09-24 DIAGNOSIS — E785 Hyperlipidemia, unspecified: Secondary | ICD-10-CM | POA: Diagnosis present

## 2017-09-24 DIAGNOSIS — E875 Hyperkalemia: Principal | ICD-10-CM | POA: Diagnosis present

## 2017-09-24 DIAGNOSIS — Z87891 Personal history of nicotine dependence: Secondary | ICD-10-CM

## 2017-09-24 DIAGNOSIS — D638 Anemia in other chronic diseases classified elsewhere: Secondary | ICD-10-CM | POA: Diagnosis present

## 2017-09-24 DIAGNOSIS — T464X5A Adverse effect of angiotensin-converting-enzyme inhibitors, initial encounter: Secondary | ICD-10-CM | POA: Diagnosis present

## 2017-09-24 DIAGNOSIS — I4510 Unspecified right bundle-branch block: Secondary | ICD-10-CM | POA: Diagnosis present

## 2017-09-24 DIAGNOSIS — E1152 Type 2 diabetes mellitus with diabetic peripheral angiopathy with gangrene: Secondary | ICD-10-CM | POA: Diagnosis present

## 2017-09-24 DIAGNOSIS — E872 Acidosis, unspecified: Secondary | ICD-10-CM | POA: Diagnosis present

## 2017-09-24 DIAGNOSIS — R532 Functional quadriplegia: Secondary | ICD-10-CM | POA: Diagnosis present

## 2017-09-24 DIAGNOSIS — F32A Depression, unspecified: Secondary | ICD-10-CM | POA: Diagnosis present

## 2017-09-24 DIAGNOSIS — Z833 Family history of diabetes mellitus: Secondary | ICD-10-CM

## 2017-09-24 LAB — BASIC METABOLIC PANEL WITH GFR
Anion gap: 8 (ref 5–15)
BUN: 38 mg/dL — ABNORMAL HIGH (ref 8–23)
CO2: 17 mmol/L — ABNORMAL LOW (ref 22–32)
Calcium: 9 mg/dL (ref 8.9–10.3)
Chloride: 111 mmol/L (ref 98–111)
Creatinine, Ser: 0.96 mg/dL (ref 0.61–1.24)
GFR calc Af Amer: >60 mL/min
GFR calc non Af Amer: >60 mL/min
Glucose, Bld: 152 mg/dL — ABNORMAL HIGH (ref 70–99)
Potassium: 6 mmol/L — ABNORMAL HIGH (ref 3.5–5.1)
Sodium: 136 mmol/L (ref 135–145)

## 2017-09-24 LAB — CBC WITH DIFFERENTIAL/PLATELET
Abs Immature Granulocytes: 0 K/uL (ref 0.0–0.1)
Basophils Absolute: 0 K/uL (ref 0.0–0.1)
Basophils Relative: 0 %
Eosinophils Absolute: 0.2 K/uL (ref 0.0–0.7)
Eosinophils Relative: 4 %
HCT: 34.9 % — ABNORMAL LOW (ref 39.0–52.0)
Hemoglobin: 10.9 g/dL — ABNORMAL LOW (ref 13.0–17.0)
Immature Granulocytes: 0 %
Lymphocytes Relative: 27 %
Lymphs Abs: 1.4 K/uL (ref 0.7–4.0)
MCH: 30.9 pg (ref 26.0–34.0)
MCHC: 31.2 g/dL (ref 30.0–36.0)
MCV: 98.9 fL (ref 78.0–100.0)
Monocytes Absolute: 0.5 K/uL (ref 0.1–1.0)
Monocytes Relative: 10 %
Neutro Abs: 2.9 K/uL (ref 1.7–7.7)
Neutrophils Relative %: 59 %
Platelets: 234 K/uL (ref 150–400)
RBC: 3.53 MIL/uL — ABNORMAL LOW (ref 4.22–5.81)
RDW: 14.1 % (ref 11.5–15.5)
WBC: 5 K/uL (ref 4.0–10.5)

## 2017-09-24 MED ORDER — SODIUM CHLORIDE 0.9 % IV BOLUS
500.0000 mL | Freq: Once | INTRAVENOUS | Status: AC
  Administered 2017-09-24: 500 mL via INTRAVENOUS

## 2017-09-24 MED ORDER — FUROSEMIDE 10 MG/ML IJ SOLN
40.0000 mg | Freq: Once | INTRAMUSCULAR | Status: AC
  Administered 2017-09-24: 40 mg via INTRAVENOUS
  Filled 2017-09-24: qty 4

## 2017-09-24 NOTE — ED Triage Notes (Signed)
Pt BIB [facility] from home, pt reports that his PCP advised him to come to ED for admission due to elevated cholesterol. Pt has no complaints.
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
## 2017-09-25 ENCOUNTER — Encounter (HOSPITAL_COMMUNITY): Payer: Self-pay | Admitting: Family Medicine

## 2017-09-25 ENCOUNTER — Ambulatory Visit (HOSPITAL_COMMUNITY): Payer: Medicare Other

## 2017-09-25 DIAGNOSIS — L0390 Cellulitis, unspecified: Secondary | ICD-10-CM

## 2017-09-25 DIAGNOSIS — T464X5A Adverse effect of angiotensin-converting-enzyme inhibitors, initial encounter: Secondary | ICD-10-CM | POA: Diagnosis present

## 2017-09-25 DIAGNOSIS — E119 Type 2 diabetes mellitus without complications: Secondary | ICD-10-CM

## 2017-09-25 DIAGNOSIS — E875 Hyperkalemia: Principal | ICD-10-CM

## 2017-09-25 DIAGNOSIS — F329 Major depressive disorder, single episode, unspecified: Secondary | ICD-10-CM

## 2017-09-25 DIAGNOSIS — D509 Iron deficiency anemia, unspecified: Secondary | ICD-10-CM | POA: Diagnosis present

## 2017-09-25 DIAGNOSIS — E785 Hyperlipidemia, unspecified: Secondary | ICD-10-CM | POA: Diagnosis present

## 2017-09-25 DIAGNOSIS — E1152 Type 2 diabetes mellitus with diabetic peripheral angiopathy with gangrene: Secondary | ICD-10-CM | POA: Diagnosis present

## 2017-09-25 DIAGNOSIS — L89153 Pressure ulcer of sacral region, stage 3: Secondary | ICD-10-CM | POA: Diagnosis present

## 2017-09-25 DIAGNOSIS — R532 Functional quadriplegia: Secondary | ICD-10-CM | POA: Diagnosis present

## 2017-09-25 DIAGNOSIS — Z833 Family history of diabetes mellitus: Secondary | ICD-10-CM | POA: Diagnosis not present

## 2017-09-25 DIAGNOSIS — I4510 Unspecified right bundle-branch block: Secondary | ICD-10-CM | POA: Diagnosis present

## 2017-09-25 DIAGNOSIS — D638 Anemia in other chronic diseases classified elsewhere: Secondary | ICD-10-CM | POA: Diagnosis present

## 2017-09-25 DIAGNOSIS — E872 Acidosis: Secondary | ICD-10-CM | POA: Diagnosis present

## 2017-09-25 DIAGNOSIS — L8990 Pressure ulcer of unspecified site, unspecified stage: Secondary | ICD-10-CM | POA: Diagnosis present

## 2017-09-25 DIAGNOSIS — Z87891 Personal history of nicotine dependence: Secondary | ICD-10-CM | POA: Diagnosis not present

## 2017-09-25 DIAGNOSIS — I10 Essential (primary) hypertension: Secondary | ICD-10-CM

## 2017-09-25 DIAGNOSIS — I96 Gangrene, not elsewhere classified: Secondary | ICD-10-CM | POA: Diagnosis present

## 2017-09-25 LAB — BASIC METABOLIC PANEL
ANION GAP: 11 (ref 5–15)
BUN: 37 mg/dL — ABNORMAL HIGH (ref 8–23)
CALCIUM: 8.9 mg/dL (ref 8.9–10.3)
CHLORIDE: 111 mmol/L (ref 98–111)
CO2: 16 mmol/L — ABNORMAL LOW (ref 22–32)
Creatinine, Ser: 0.88 mg/dL (ref 0.61–1.24)
GFR calc non Af Amer: >60 mL/min (ref >60)
Glucose, Bld: 136 mg/dL — ABNORMAL HIGH (ref 70–99)
Potassium: 6.7 mmol/L (ref 3.5–5.1)
SODIUM: 138 mmol/L (ref 135–145)

## 2017-09-25 LAB — GLUCOSE, CAPILLARY
GLUCOSE-CAPILLARY: 110 mg/dL — AB (ref 70–99)
GLUCOSE-CAPILLARY: 123 mg/dL — AB (ref 70–99)
GLUCOSE-CAPILLARY: 127 mg/dL — AB (ref 70–99)
GLUCOSE-CAPILLARY: 127 mg/dL — AB (ref 70–99)

## 2017-09-25 LAB — NA AND K (SODIUM & POTASSIUM), RAND UR
POTASSIUM UR: 13 mmol/L
Sodium, Ur: 139 mmol/L

## 2017-09-25 LAB — POTASSIUM
POTASSIUM: 5.9 mmol/L — AB (ref 3.5–5.1)
Potassium: 6.2 mmol/L — ABNORMAL HIGH (ref 3.5–5.1)

## 2017-09-25 LAB — CBG MONITORING, ED: Glucose-Capillary: 128 mg/dL — ABNORMAL HIGH (ref 70–99)

## 2017-09-25 LAB — CK: Total CK: 51 U/L (ref 49–397)

## 2017-09-25 MED ORDER — ENOXAPARIN SODIUM 40 MG/0.4ML ~~LOC~~ SOLN
40.0000 mg | SUBCUTANEOUS | Status: DC
  Administered 2017-09-25: 40 mg via SUBCUTANEOUS
  Filled 2017-09-25: qty 0.4

## 2017-09-25 MED ORDER — SENNOSIDES-DOCUSATE SODIUM 8.6-50 MG PO TABS
1.0000 | ORAL_TABLET | Freq: Every evening | ORAL | Status: DC | PRN
  Administered 2017-09-25: 1 via ORAL
  Filled 2017-09-25: qty 1

## 2017-09-25 MED ORDER — SODIUM CHLORIDE 0.9 % IV SOLN
INTRAVENOUS | Status: AC
  Administered 2017-09-25: 02:00:00 via INTRAVENOUS

## 2017-09-25 MED ORDER — SODIUM ZIRCONIUM CYCLOSILICATE 5 G PO PACK
5.0000 g | PACK | Freq: Once | ORAL | Status: AC
  Administered 2017-09-25: 5 g via ORAL
  Filled 2017-09-25: qty 1

## 2017-09-25 MED ORDER — SODIUM CHLORIDE 0.9% FLUSH
3.0000 mL | Freq: Two times a day (BID) | INTRAVENOUS | Status: DC
  Administered 2017-09-25 (×2): 3 mL via INTRAVENOUS

## 2017-09-25 MED ORDER — ADULT MULTIVITAMIN W/MINERALS CH
1.0000 | ORAL_TABLET | Freq: Every day | ORAL | Status: DC
  Administered 2017-09-25 – 2017-09-26 (×2): 1 via ORAL
  Filled 2017-09-25 (×2): qty 1

## 2017-09-25 MED ORDER — PRO-STAT SUGAR FREE PO LIQD
30.0000 mL | Freq: Two times a day (BID) | ORAL | Status: DC
  Administered 2017-09-25 – 2017-09-26 (×2): 30 mL via ORAL
  Filled 2017-09-25 (×2): qty 30

## 2017-09-25 MED ORDER — ONDANSETRON HCL 4 MG PO TABS: 4.0000 mg | ORAL_TABLET | Freq: Four times a day (QID) | ORAL | Status: DC | PRN

## 2017-09-25 MED ORDER — ESCITALOPRAM OXALATE 10 MG PO TABS
10.0000 mg | ORAL_TABLET | Freq: Every day | ORAL | Status: DC
  Administered 2017-09-25 – 2017-09-26 (×2): 10 mg via ORAL
  Filled 2017-09-25 (×2): qty 1

## 2017-09-25 MED ORDER — COLLAGENASE 250 UNIT/GM EX OINT
TOPICAL_OINTMENT | Freq: Every day | CUTANEOUS | Status: DC
  Administered 2017-09-25 – 2017-09-26 (×2): via TOPICAL
  Filled 2017-09-25: qty 30

## 2017-09-25 MED ORDER — SODIUM POLYSTYRENE SULFONATE 15 GM/60ML PO SUSP
15.0000 g | Freq: Once | ORAL | Status: AC
  Administered 2017-09-25: 15 g via ORAL
  Filled 2017-09-25: qty 60

## 2017-09-25 MED ORDER — ONDANSETRON HCL 4 MG/2ML IJ SOLN: 4.0000 mg | Freq: Four times a day (QID) | INTRAMUSCULAR | Status: DC | PRN

## 2017-09-25 MED ORDER — INSULIN ASPART 100 UNIT/ML ~~LOC~~ SOLN
0.0000 [IU] | Freq: Three times a day (TID) | SUBCUTANEOUS | Status: DC
  Administered 2017-09-25 (×2): 1 [IU] via SUBCUTANEOUS

## 2017-09-25 MED ORDER — ACETAMINOPHEN 325 MG PO TABS: 650.0000 mg | ORAL_TABLET | Freq: Four times a day (QID) | ORAL | Status: DC | PRN

## 2017-09-25 MED ORDER — SODIUM POLYSTYRENE SULFONATE 15 GM/60ML PO SUSP
30.0000 g | Freq: Once | ORAL | Status: DC
  Filled 2017-09-25: qty 120

## 2017-09-25 MED ORDER — JUVEN PO PACK
1.0000 | PACK | Freq: Two times a day (BID) | ORAL | Status: DC
  Administered 2017-09-26: 1 via ORAL
  Filled 2017-09-25 (×2): qty 1

## 2017-09-25 MED ORDER — ACETAMINOPHEN 650 MG RE SUPP: 650.0000 mg | Freq: Four times a day (QID) | RECTAL | Status: DC | PRN

## 2017-09-25 MED ORDER — PRAVASTATIN SODIUM 20 MG PO TABS
20.0000 mg | ORAL_TABLET | Freq: Every day | ORAL | Status: DC
  Administered 2017-09-25: 20 mg via ORAL
  Filled 2017-09-25: qty 1

## 2017-09-25 MED ORDER — SODIUM CHLORIDE 0.9 % IV SOLN
INTRAVENOUS | Status: AC
  Administered 2017-09-25: 18:00:00 via INTRAVENOUS

## 2017-09-25 MED ORDER — HYDROCODONE-ACETAMINOPHEN 5-325 MG PO TABS
1.0000 | ORAL_TABLET | ORAL | Status: DC | PRN
  Administered 2017-09-25: 2 via ORAL
  Filled 2017-09-25: qty 2

## 2017-09-25 MED ORDER — ASPIRIN EC 81 MG PO TBEC
81.0000 mg | DELAYED_RELEASE_TABLET | Freq: Every day | ORAL | Status: DC
  Administered 2017-09-25 – 2017-09-26 (×2): 81 mg via ORAL
  Filled 2017-09-25 (×2): qty 1

## 2017-09-25 MED ORDER — INSULIN ASPART 100 UNIT/ML ~~LOC~~ SOLN: 0.0000 [IU] | Freq: Every day | SUBCUTANEOUS | Status: DC

## 2017-09-25 NOTE — Progress Notes (Addendum)
PROGRESS NOTE    [name] [name]  RXV:[id number] DOB: 09-22-1945 DOA: 09/24/2017 PCP: Beckie Salts, MD   Brief Narrative:  71 year old male with a history of quadriplegia, with chronic wounds to the right hand, bilateral lower extremities, history of hypertension, diabetes type 2, depression, brought to the emergency department at the direction of his PCP, for evaluation of hyperkalemia, noted in routine blood work.  He was essentially asymptomatic for these, and he does not recall taking any supplemental potassium.  He was taking ACE inhibitors however. He received IV Lasix, and normal saline at 500 cc at the ED, as well as Kayexalate 15 g x 1, without improvement  With a value of 6.7. Another dose of Kayexalate 30 g x 1 given at the unit, will follow labs   Assessment & Plan:   Principal Problem:   Hyperkalemia Active Problems:   Essential hypertension   Diabetes mellitus without complication (HCC)   Iron deficiency anemia   Depression   Pressure injury of skin  Acute hyperkalemia:  ACEI use vs acidosis.   -did not respond to kayexelate-- will try lokelma -recheck K this PM Telemetry IVF at 100 cc/h Hold ACE I  -check CK -follow labs-- ? Acidosis source-- was on metformin  Hypertension Continue home anti-hypertensive medications except lisinopril in light of hyperkalemia  Type II Diabetes  Hold home metformin Continue sliding scale insulin  Quadriplegia, with chronic sacral, leg and right hand ulcers Right great toe necrosis Continue follow-up with wound care while in the hospital, as outpatient he is being followed by [facility], last visit on 09/10/2017 Continue collagenase daily Check ABIs (do not see in care everywhere)  Hyperlipidemia Continue home Pravachol  Depression Continue Lexapro  Anemia of chronic disease, stable at 10.9, no bleeding issues noted CBC in am    DVT prophylaxis: Lovenox Code Status: Full code  Family Communication: called daughter, no  response Disposition Plan: To home when potassium levels improve patient is clinically stable.  Consultants:   Wound care for chronic ulcers  Procedures:  None  Antimicrobials:   None   Subjective: C/o pain in right foot- toe and lateral heel   Objective: Vitals:   09/25/17 0234 09/25/17 0819 09/25/17 0850 09/25/17 1228  BP: 108/73 (!) 86/34 (!) 107/51 112/78  Pulse: 81 82 82 86  Resp: 20 20 17 20   Temp: 97.9 F (36.6 C) (!) 97.5 F (36.4 C) 97.9 F (36.6 C)   TempSrc: Oral Oral Oral   SpO2: 100% 100% 100% 100%  Weight:      Height:        Intake/Output Summary (Last 24 hours) at 09/25/2017 1345 Last data filed at 09/25/2017 1333 Gross per 24 hour  Intake 2108.33 ml  Output 400 ml  Net 1708.33 ml   Filed Weights   09/25/17 0228  Weight: 55.1 kg    Examination:  General exam:  chronically ill-appearing, cachectic. Respiratory system: Clear to auscultation. Respiratory effort normal. Cardiovascular system: S1 & S2 heard, RRR. No JVD, murmurs, rubs, gallops or clicks. No pedal edema. Decreased pedal pulses, cool to touch  Gastrointestinal system: Abdomen is nondistended, soft and nontender. No organomegaly or masses felt. Normal bowel sounds heard. Central nervous system: Alert  Skin: Chronic ulcerations to the right hand, sacral area, right hand- with some intact blisters, swelling digits and redness.  Of note, some of the lower extremity wounds, some necrotic regions, especially in the great toe, area is non odorous Psychiatry:  poor historian    Data Reviewed:  I have personally reviewed following labs and imaging studies  CBC: Recent Labs  Lab 09/24/17 2142  WBC 5.0  NEUTROABS 2.9  HGB 10.9*  HCT 34.9*  MCV 98.9  PLT 962   Basic Metabolic Panel: Recent Labs  Lab 09/24/17 2142 09/25/17 0133 09/25/17 0702  NA 136  --  138  K 6.0* 6.2* 6.7*  CL 111  --  111  CO2 17*  --  16*  GLUCOSE 152*  --  136*  BUN 38*  --  37*  CREATININE 0.96  --   0.88  CALCIUM 9.0  --  8.9   GFR: Estimated Creatinine Clearance: 59.1 mL/min (by C-G formula based on SCr of 0.88 mg/dL). Liver Function Tests: No results for input(s): AST, ALT, ALKPHOS, BILITOT, PROT, ALBUMIN in the last 168 hours. No results for input(s): LIPASE, AMYLASE in the last 168 hours. No results for input(s): AMMONIA in the last 168 hours. Coagulation Profile: No results for input(s): INR, PROTIME in the last 168 hours. Cardiac Enzymes: Recent Labs  Lab 09/25/17 0702  CKTOTAL 51   BNP (last 3 results) No results for input(s): PROBNP in the last 8760 hours. HbA1C: No results for input(s): HGBA1C in the last 72 hours. CBG: Recent Labs  Lab 09/25/17 0130 09/25/17 0745 09/25/17 1113  GLUCAP 128* 123* 127*   Lipid Profile: No results for input(s): CHOL, HDL, LDLCALC, TRIG, CHOLHDL, LDLDIRECT in the last 72 hours. Thyroid Function Tests: No results for input(s): TSH, T4TOTAL, FREET4, T3FREE, THYROIDAB in the last 72 hours. Anemia Panel: No results for input(s): VITAMINB12, FOLATE, FERRITIN, TIBC, IRON, RETICCTPCT in the last 72 hours. Sepsis Labs: No results for input(s): PROCALCITON, LATICACIDVEN in the last 168 hours.  No results found for this or any previous visit (from the past 240 hour(s)).       Radiology Studies: No results found.      Scheduled Meds: . aspirin EC  81 mg Oral Daily  . collagenase   Topical Daily  . enoxaparin (LOVENOX) injection  40 mg Subcutaneous Q24H  . escitalopram  10 mg Oral Daily  . insulin aspart  0-5 Units Subcutaneous QHS  . insulin aspart  0-9 Units Subcutaneous TID WC  . pravastatin  20 mg Oral q1800  . sodium chloride flush  3 mL Intravenous Q12H   Continuous Infusions:    LOS: 0 days       Geradine Girt, DO [facility]   If 7PM-7AM, please contact night-coverage [email or web address] Password [email or web address] 09/25/2017, 1:45 PM

## 2017-09-25 NOTE — Progress Notes (Signed)
Potassium 6.7. Paged MD Eliseo Squires and informed.  Manuel Lawhead, RN

## 2017-09-25 NOTE — Progress Notes (Signed)
Dressing changed on R and L lateral wound per order.  Pooja Camuso, RN

## 2017-09-25 NOTE — Progress Notes (Signed)
To the best of my knowledge, documentation by  Sarajane Marek nursing student is correct.

## 2017-09-25 NOTE — Progress Notes (Signed)
Matress replacement completed. Prevalon boots ordered and placed on BLE.  Righteous Claiborne, RN

## 2017-09-25 NOTE — Consult Note (Signed)
[place] Nurse wound consult note Reason for Consult:multiple wounds Patient from home, lives with his wife. Paraplegia with multiple leg wounds.  Wound type: Full thickness ulcerations lateral left and right calf Healed area on the left ulcer, intact skin Healed area on the right malleolus  Darkened area on the right great toe, most likely arterial in nature Stage 3 Pressure injury sacrum Pressure Injury POA: Yes Measurement: Right lateral: 8cm x 2cm x 0.56m  Left lateral: 4cm x 1.5cm x 0.3cm  Sacrum: 2.0cm x 2.0cm x 0.2cm  Wound bed: Right lateral: 90% pink, non granular, fibrin present/10% necrotic tissue along distal and proximal wound edges Left lateral: 90% pink, non granular, fibrin present/10% necrotic tissue along the distal wound edge Right great toe, 100% dark, black tissue, but not eschar at this time Sacrum; 100% clean, pink, moist Drainage (amount, consistency, odor) minimal, no odor from any of the sites of open wounds  Periwound: intact, multiple sites of the pretibial region of both the right and left legs with scabs. Weak distal pulses  Severe foot drop bilaterally and fungal toenails.  Dressing procedure/placement/frequency: Enzymatic debridement to the left and right leg wounds, cover with saline. Top with foam Add Prevalon boots for vunerable heels, to offload at all times. Maximize nutrition for wound healing.  Continue foam to protect the sacrum wound, will add air mattress for moisture management and pressure redistribution.   Consider vascular evaluation due to multiple LE wounds and some with necrotic tissue, they are not acutely infected and are mostly clean.   Discussed POC with patient and bedside nurse.  Re consult if needed, will not follow at this time. Thanks  Alysson Geist [employer], RN,CWOCN, CNS, [place] [phone])

## 2017-09-25 NOTE — Progress Notes (Signed)
Initial Nutrition Assessment  DOCUMENTATION CODES:   Not applicable  INTERVENTION:   -30 ml Prostat BID, each supplement provides 100 kcals and 15 grams protein -MVI with minerals daily -1 packet Juven BID, each packet provides 80 calories, 8 grams of carbohydrate, and 14 grams of amino acids; supplement contains CaHMB, glutamine, and arginine, to promote wound healing  NUTRITION DIAGNOSIS:   Increased nutrient needs related to wound healing as evidenced by estimated needs.  GOAL:   Patient will meet greater than or equal to 90% of their needs  MONITOR:   PO intake, Supplement acceptance, Diet advancement, Labs, Weight trends, Skin, I & O's  REASON FOR ASSESSMENT:   Low [place]    ASSESSMENT:   [name] [name] is a 72 y.o. 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  Pt admitted with hyperkalemia.   9/11- s/p CWOCN evaluation  No family at bedside to provide further history. Pt very somnolent and did not wake to name being called or touch.   Noted meal completion 50-100%, however, lunch tray was untouched at time of visit.   No recent wt readings over the past few years.   Pt with multiple areas of skin breakdown. Pt also with several areas of moderate fat and muscle depletion, which is likely related to quadriplegia. Unable to identify malnutrition at this time.   Last Hgb A1c: 5.6 (04/08/15).  PTA DM medications are 500 mg metformin daily.   Labs reviewed: K: 6.7, CBGS: 123-127 (inpatient orders for glycemic control are 0-5 units insulin aspart q HS and 0-9 units insulin aspart TID with meals).   NUTRITION - FOCUSED PHYSICAL EXAM:    Most Recent Value  Orbital Region  Severe depletion  Upper Arm Region  Moderate depletion  Thoracic and  Lumbar Region  Severe depletion  Buccal Region  Severe depletion  Temple Region  Severe depletion  Clavicle Bone Region  Severe depletion  Clavicle and Acromion Bone Region  Severe depletion  Scapular Bone Region  Severe depletion  Dorsal Hand  Severe depletion  Patellar Region  Moderate depletion  Anterior Thigh Region  Moderate depletion  Posterior Calf Region  Moderate depletion  Edema (RD Assessment)  Moderate  Hair  Reviewed  Eyes  Reviewed  Mouth  Reviewed  Skin  Reviewed  Nails  Reviewed       Diet Order:   Diet Order            Diet Heart Room service appropriate? Yes; Fluid consistency: Thin  Diet effective now              EDUCATION NEEDS:   Not appropriate for education at this time  Skin:  Skin Assessment: Skin Integrity Issues: Skin Integrity Issues:: Stage III, Other (Comment) Stage III: sacrum Other: Full thickness ulcerations of lt lateral and rt calf, darkened area on rt great toe  Last BM:  09/25/17  Height:   Ht Readings from Last 1 Encounters:  09/25/17 5\' 7"  (1.702 m)    Weight:   Wt Readings from Last 1 Encounters:  09/25/17 55.1 kg    Ideal Body Weight:  58.7 kg  BMI:  Body mass index is 19.03 kg/m.  Estimated Nutritional Needs:   Kcal:  1700-1900  Protein:  95-110 grams  Fluid:  >1.7 L    Norely Schlick A.  Jimmye Norman, RD, LDN, CDE Pager: [phone] After hours Pager: [phone]

## 2017-09-25 NOTE — Progress Notes (Signed)
Am blood sample hemolyzed. Phlebotomy will redraw.
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
## 2017-09-25 NOTE — Progress Notes (Signed)
OT Cancellation Note  Patient Details Name: Dontavis Tschantz MRN: [id number] DOB: [birth year]   Cancelled Treatment:    Reason Eval/Treat Not Completed: OT screened, no needs identified, will sign off(pt with baseline dependence)  Malka So 09/25/2017, 1:03 PM  Nestor Lewandowsky, OTR/L Acute Rehabilitation Services Pager: [phone] Office: [phone]

## 2017-09-25 NOTE — Progress Notes (Signed)
Patient arrived to [facility] from [facility]. Patient alert and oriented x2. Admission assessment completed. Unable to complete admission history as patient is a poor historian. Will pass on to day  RN to get history when family is present. Patient states daughter will be here in the morning. Will continue to monitor patient.

## 2017-09-25 NOTE — Progress Notes (Signed)
VASCULAR LAB PRELIMINARY  ARTERIAL  ABI completed:    RIGHT    LEFT    PRESSURE WAVEFORM  PRESSURE WAVEFORM  BRACHIAL 145 triphasic BRACHIAL Unable to obtain due to IV    DP 175 triphasic DP 139 Triphasic          PT Unable to obtain due to wound  PT Unable to obtain due to wound                   RIGHT LEFT  ABI 1.21 0.96     Bilateral ABI's are within normal range.   Garnet Sierras 09/25/2017, 4:07 PM

## 2017-09-26 DIAGNOSIS — E872 Acidosis, unspecified: Secondary | ICD-10-CM | POA: Diagnosis present

## 2017-09-26 LAB — CBC
HCT: 30.3 % — ABNORMAL LOW (ref 39.0–52.0)
Hemoglobin: 9.7 g/dL — ABNORMAL LOW (ref 13.0–17.0)
MCH: 31.1 pg (ref 26.0–34.0)
MCHC: 32 g/dL (ref 30.0–36.0)
MCV: 97.1 fL (ref 78.0–100.0)
PLATELETS: 235 10*3/uL (ref 150–400)
RBC: 3.12 MIL/uL — AB (ref 4.22–5.81)
RDW: 13.9 % (ref 11.5–15.5)
WBC: 5.3 10*3/uL (ref 4.0–10.5)

## 2017-09-26 LAB — GLUCOSE, CAPILLARY: GLUCOSE-CAPILLARY: 83 mg/dL (ref 70–99)

## 2017-09-26 LAB — BASIC METABOLIC PANEL
Anion gap: 5 (ref 5–15)
BUN: 33 mg/dL — ABNORMAL HIGH (ref 8–23)
CALCIUM: 8.6 mg/dL — AB (ref 8.9–10.3)
CHLORIDE: 113 mmol/L — AB (ref 98–111)
CO2: 20 mmol/L — AB (ref 22–32)
CREATININE: 0.84 mg/dL (ref 0.61–1.24)
GFR calc Af Amer: >60 mL/min (ref >60)
GFR calc non Af Amer: >60 mL/min (ref >60)
GLUCOSE: 98 mg/dL (ref 70–99)
Potassium: 5 mmol/L (ref 3.5–5.1)
Sodium: 138 mmol/L (ref 135–145)

## 2017-09-26 LAB — LACTIC ACID, PLASMA: Lactic Acid, Venous: 0.7 mmol/L (ref 0.5–1.9)

## 2017-09-26 MED ORDER — JUVEN PO PACK: 1.0000 | PACK | Freq: Two times a day (BID) | ORAL | 0 refills | Status: DC

## 2017-09-26 MED ORDER — METFORMIN HCL ER 500 MG PO TB24: 500.0000 mg | ORAL_TABLET | Freq: Every day | ORAL | 0 refills | Status: DC

## 2017-09-26 MED ORDER — PRO-STAT SUGAR FREE PO LIQD: 30.0000 mL | Freq: Two times a day (BID) | ORAL | 0 refills | Status: DC

## 2017-09-26 NOTE — Discharge Summary (Signed)
Physician Discharge Summary  [name] [name] GMW:[id number] DOB: [birth year] DOA: 09/24/2017  PCP: Beckie Salts, MD  Admit date: 09/24/2017 Discharge date: 09/26/2017  Admitted From: home Disposition:  home  Recommendations for Outpatient Follow-up:  1. Follow up with PCP in 1-2 weeks. Check BMET during follow up to check for potassium and bicarb.  Lisinopril has been discontinued. resume metformin from 9/15.  Home Health: none Equipment/Devices: none  Discharge Condition: Fair CODE STATUS: Full code Diet recommendation: Carb modified   Discharge Diagnoses:  Principal Problem:   Hyperkalemia   Active Problems:   Essential hypertension   Diabetes mellitus without complication (HCC)   Iron deficiency anemia   Depression   Pressure injury of skin   Metabolic acidosis  Brief narrative/HPI 72 year old male with quadriplegia, chronic wounds to the right hand, bilateral lower extremities, hypertension, type 2 diabetes mellitus, depression brought to the ED after sent by his PCP for evaluation of hyperkalemia which was found on routine blood work.  Patient denied any symptoms and is not on potassium supplement.  He is on lisinopril.  Patient received IV Lasix and normal saline along with Kayexalate 15 g without improvement of potassium that was 6.7.  He was given another dose of Kayexalate and placed on telemetry.  EKG was unremarkable.  He was also found to have metabolic acidosis.  Hospital course Hyperkalemia No clear etiology.  Possibly due to ACE inhibitor +/-metabolic acidosis.  Did not respond to Kayexalate and was given lokelma.  Stable on telemetry.  Lisinopril has been discontinued.  CK normal.  Potassium improved to 5 this morning. I will discontinue his lisinopril.  Check potassium during outpatient follow-up.  Metabolic acidosis No clear etiology.  Metformin held and acidosis now corrected with IV fluids.  Lactic acid was normal.  Resume metformin on 9/15.  Essential  hypertension Blood pressure soft yesterday.  Lisinopril discontinued.  Blood pressure elevated during outpatient follow-up can add another antihypertensive regimen.  Type 2 diabetes mellitus, controlled Metformin held for metabolic acidosis and can be resumed in 3 days.  Hyperlipidemia Resume statin  Chronic depression Continue Lexapro  Anemia of chronic disease Stable.  Quadriplegia with chronic sacral, leg and right hand ulcers/concern for right great toe necrosis. Patient follows with wound care at [facility] and was seen about 2 weeks back.    ABI done and was within normal limits.  Family to medication: Discussed with daughter on the phone Disposition: Home Procedure: ABI   Discharge Instructions   Allergies as of 09/26/2017   No Known Allergies     Medication List    STOP taking these medications   lisinopril 5 MG tablet Commonly known as:  PRINIVIL,ZESTRIL   traMADol 50 MG tablet Commonly known as:  ULTRAM     TAKE these medications   [employer] SLOW RELEASE IRON 143 (45 Fe) MG Tbcr Generic drug:  Ferrous Sulfate Take 1 tablet by mouth daily.   feeding supplement (PRO-STAT SUGAR FREE 64) Liqd Take 30 mLs by mouth 2 (two) times daily.   metFORMIN 500 MG 24 hr tablet Commonly known as:  GLUCOPHAGE-XR Take 1 tablet (500 mg total) by mouth daily with breakfast. Start taking on:  09/29/2017 What changed:  These instructions start on 09/29/2017. If you are unsure what to do until then, ask your doctor or other care provider.   multivitamin with minerals Tabs tablet Take 1 tablet by mouth daily.   nutrition supplement (JUVEN) Pack Take 1 packet by mouth 2 (two) times daily between meals.  pravastatin 20 MG tablet Commonly known as:  PRAVACHOL Take 1 tablet by mouth daily.      Follow-up Information    Beckie Salts, MD. Schedule an appointment as soon as possible for a visit in 1 week(s).   Specialty:  Internal Medicine Contact information: [address] ,[place] [address] [place] [address] [phone]          No Known Allergies      Procedures/Studies:  No results found.    Subjective: Denies any symptoms.  No overnight events.  Stable on telemetry.  Discharge Exam: Vitals:   09/25/17 2341 09/26/17 0700  BP: 102/74 104/76  Pulse: 90 80  Resp: 18 14  Temp: 98.3 F (36.8 C) 98 F (36.7 C)  SpO2: 99% 99%   Vitals:   09/25/17 1228 09/25/17 1935 09/25/17 2341 09/26/17 0700  BP: 112/78 139/78 102/74 104/76  Pulse: 86 84 90 80  Resp: 20 18 18 14   Temp:  97.9 F (36.6 C) 98.3 F (36.8 C) 98 F (36.7 C)  TempSrc:  Oral Oral Oral  SpO2: 100% 100% 99% 99%  Weight:      Height:        General: Elderly male not in distress HEENT: Moist mucosa, supple neck Chest: Clear bilaterally CVS: Normal S1 and S2, no murmurs GI: Soft, nondistended, nontender Musculoskeletal: 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     Microbiology: No results found for this or any previous visit (from the past 240 hour(s)).   Labs: BNP (last 3 results) No results for input(s): BNP in the last 8760 hours. Basic Metabolic Panel: Recent Labs  Lab 09/24/17 2142 09/25/17 0133 09/25/17 0702 09/25/17 1712 09/26/17 0454  NA 136  --  138  --  138  K 6.0* 6.2* 6.7* 5.9* 5.0  CL 111  --  111  --  113*  CO2 17*  --  16*  --  20*  GLUCOSE 152*  --  136*  --  98  BUN 38*  --  37*  --  33*  CREATININE 0.96  --  0.88  --  0.84  CALCIUM 9.0  --  8.9  --  8.6*   Liver Function Tests: No results for input(s): AST, ALT, ALKPHOS, BILITOT, PROT, ALBUMIN in the last 168 hours. No results for input(s): LIPASE, AMYLASE in the last 168 hours. No results for input(s): AMMONIA in the last 168 hours. CBC: Recent Labs  Lab 09/24/17 2142 09/26/17 0454   WBC 5.0 5.3  NEUTROABS 2.9  --   HGB 10.9* 9.7*  HCT 34.9* 30.3*  MCV 98.9 97.1  PLT 234 235   Cardiac Enzymes: Recent Labs  Lab 09/25/17 0702  CKTOTAL 51   BNP: Invalid input(s): POCBNP CBG: Recent Labs  Lab 09/25/17 0745 09/25/17 1113 09/25/17 1700 09/25/17 2339 09/26/17 0801  GLUCAP 123* 127* 110* 127* 83   D-Dimer No results for input(s): DDIMER in the last 72 hours. Hgb A1c No results for input(s): HGBA1C in the last 72 hours. Lipid Profile No results for input(s): CHOL, HDL, LDLCALC, TRIG, CHOLHDL, LDLDIRECT in the last 72 hours. Thyroid function studies No results for input(s): TSH, T4TOTAL, T3FREE, THYROIDAB in the last 72 hours.  Invalid input(s): FREET3 Anemia work up No results for input(s): VITAMINB12, FOLATE, FERRITIN, TIBC, IRON, RETICCTPCT in the last 53  hours. Urinalysis    Component Value Date/Time   COLORURINE YELLOW 02/21/2013 2012   APPEARANCEUR CLOUDY (A) 02/21/2013 2012   LABSPEC 1.018 02/21/2013 2012   PHURINE 5.5 02/21/2013 2012   GLUCOSEU NEGATIVE 02/21/2013 2012   HGBUR SMALL (A) 02/21/2013 2012   BILIRUBINUR NEGATIVE 02/21/2013 2012   [place] NEGATIVE 02/21/2013 2012   PROTEINUR 30 (A) 02/21/2013 2012   UROBILINOGEN 0.2 02/21/2013 2012   NITRITE NEGATIVE 02/21/2013 2012   LEUKOCYTESUR LARGE (A) 02/21/2013 2012   Sepsis Labs Invalid input(s): PROCALCITONIN,  WBC,  LACTICIDVEN Microbiology No results found for this or any previous visit (from the past 240 hour(s)).   Time coordinating discharge: <30 minutes  SIGNED:   Louellen Molder, MD  [facility] 09/26/2017, 10:07 AM Pager   If 7PM-7AM, please contact night-coverage [email or web address] Password [email or web address]

## 2017-09-26 NOTE — Care Management Note (Signed)
Case Management Note  Patient Details  Name: [name] [name] MRN: [id number] Date of Birth: Mar 30, 1945  Subjective/Objective:   Hyperkalemia              Action/Plan: CM talked to the patient  And daughter at the bedside for disposition needs; [name] [name] stated that she is the primary caregiver and no HHC services is needed at this time; home address verified for ambulance transportation home.  Expected Discharge Date:     Possibly 09/26/2017             Expected Discharge Plan:  Home Discharge planning Services  CM Consult  Choice offered to:  Patient, Adult Children  HH Arranged:  Patient Refused [facility]    Status of Service:  In process, will continue to follow  Sherrilyn Rist [phone] 09/26/2017, 9:57 AM

## 2017-09-26 NOTE — Progress Notes (Signed)
Patient discharge to home, instructions reviewed with patient's sister, iv removed, tele discontinued, vital signs stable, dressing changed prior to discharge Neta Mends RN 11:34 AM 09-26-2017

## 2017-11-16 ENCOUNTER — Encounter (HOSPITAL_COMMUNITY): Payer: Self-pay

## 2017-11-16 ENCOUNTER — Inpatient Hospital Stay (HOSPITAL_COMMUNITY)
Admission: EM | Admit: 2017-11-16 | Discharge: 2017-12-02 | DRG: 579 | Disposition: A | Discharge status: Skilled Nursing Facility | Payer: Medicare Other | Attending: Internal Medicine | Admitting: Internal Medicine

## 2017-11-16 ENCOUNTER — Other Ambulatory Visit: Payer: Self-pay

## 2017-11-16 ENCOUNTER — Emergency Department (HOSPITAL_COMMUNITY): Payer: Medicare Other

## 2017-11-16 DIAGNOSIS — D649 Anemia, unspecified: Secondary | ICD-10-CM | POA: Diagnosis present

## 2017-11-16 DIAGNOSIS — E875 Hyperkalemia: Secondary | ICD-10-CM | POA: Diagnosis not present

## 2017-11-16 DIAGNOSIS — Z8673 Personal history of transient ischemic attack (TIA), and cerebral infarction without residual deficits: Secondary | ICD-10-CM

## 2017-11-16 DIAGNOSIS — I10 Essential (primary) hypertension: Secondary | ICD-10-CM | POA: Diagnosis present

## 2017-11-16 DIAGNOSIS — E782 Mixed hyperlipidemia: Secondary | ICD-10-CM | POA: Diagnosis not present

## 2017-11-16 DIAGNOSIS — L03113 Cellulitis of right upper limb: Secondary | ICD-10-CM | POA: Diagnosis present

## 2017-11-16 DIAGNOSIS — Z833 Family history of diabetes mellitus: Secondary | ICD-10-CM | POA: Diagnosis not present

## 2017-11-16 DIAGNOSIS — L089 Local infection of the skin and subcutaneous tissue, unspecified: Secondary | ICD-10-CM | POA: Diagnosis present

## 2017-11-16 DIAGNOSIS — Z6822 Body mass index (BMI) 22.0-22.9, adult: Secondary | ICD-10-CM

## 2017-11-16 DIAGNOSIS — L89153 Pressure ulcer of sacral region, stage 3: Secondary | ICD-10-CM | POA: Diagnosis present

## 2017-11-16 DIAGNOSIS — Z888 Allergy status to other drugs, medicaments and biological substances status: Secondary | ICD-10-CM

## 2017-11-16 DIAGNOSIS — Z79899 Other long term (current) drug therapy: Secondary | ICD-10-CM | POA: Diagnosis not present

## 2017-11-16 DIAGNOSIS — Z7401 Bed confinement status: Secondary | ICD-10-CM

## 2017-11-16 DIAGNOSIS — I9581 Postprocedural hypotension: Secondary | ICD-10-CM | POA: Diagnosis not present

## 2017-11-16 DIAGNOSIS — Z87828 Personal history of other (healed) physical injury and trauma: Secondary | ICD-10-CM

## 2017-11-16 DIAGNOSIS — E440 Moderate protein-calorie malnutrition: Secondary | ICD-10-CM | POA: Diagnosis present

## 2017-11-16 DIAGNOSIS — Z23 Encounter for immunization: Secondary | ICD-10-CM | POA: Diagnosis present

## 2017-11-16 DIAGNOSIS — Z7984 Long term (current) use of oral hypoglycemic drugs: Secondary | ICD-10-CM

## 2017-11-16 DIAGNOSIS — Z87891 Personal history of nicotine dependence: Secondary | ICD-10-CM

## 2017-11-16 DIAGNOSIS — T07XXXA Unspecified multiple injuries, initial encounter: Secondary | ICD-10-CM | POA: Diagnosis not present

## 2017-11-16 DIAGNOSIS — E119 Type 2 diabetes mellitus without complications: Secondary | ICD-10-CM

## 2017-11-16 DIAGNOSIS — M24541 Contracture, right hand: Secondary | ICD-10-CM | POA: Diagnosis present

## 2017-11-16 DIAGNOSIS — G8250 Quadriplegia, unspecified: Secondary | ICD-10-CM | POA: Diagnosis present

## 2017-11-16 DIAGNOSIS — T148XXA Other injury of unspecified body region, initial encounter: Secondary | ICD-10-CM | POA: Diagnosis not present

## 2017-11-16 DIAGNOSIS — D638 Anemia in other chronic diseases classified elsewhere: Secondary | ICD-10-CM

## 2017-11-16 DIAGNOSIS — G8220 Paraplegia, unspecified: Secondary | ICD-10-CM | POA: Diagnosis not present

## 2017-11-16 DIAGNOSIS — J181 Lobar pneumonia, unspecified organism: Secondary | ICD-10-CM | POA: Diagnosis not present

## 2017-11-16 DIAGNOSIS — F329 Major depressive disorder, single episode, unspecified: Secondary | ICD-10-CM | POA: Diagnosis present

## 2017-11-16 DIAGNOSIS — S61401A Unspecified open wound of right hand, initial encounter: Secondary | ICD-10-CM

## 2017-11-16 DIAGNOSIS — M24531 Contracture, right wrist: Secondary | ICD-10-CM | POA: Diagnosis present

## 2017-11-16 DIAGNOSIS — X58XXXA Exposure to other specified factors, initial encounter: Secondary | ICD-10-CM | POA: Diagnosis not present

## 2017-11-16 DIAGNOSIS — Z789 Other specified health status: Secondary | ICD-10-CM

## 2017-11-16 DIAGNOSIS — E785 Hyperlipidemia, unspecified: Secondary | ICD-10-CM | POA: Diagnosis present

## 2017-11-16 DIAGNOSIS — R509 Fever, unspecified: Secondary | ICD-10-CM

## 2017-11-16 LAB — BASIC METABOLIC PANEL
Anion gap: 7 (ref 5–15)
BUN: 22 mg/dL (ref 8–23)
CO2: 26 mmol/L (ref 22–32)
Calcium: 8.8 mg/dL — ABNORMAL LOW (ref 8.9–10.3)
Chloride: 107 mmol/L (ref 98–111)
Creatinine, Ser: 0.96 mg/dL (ref 0.61–1.24)
GFR calc Af Amer: >60 mL/min (ref >60)
GFR calc non Af Amer: >60 mL/min (ref >60)
Glucose, Bld: 120 mg/dL — ABNORMAL HIGH (ref 70–99)
Potassium: 4.2 mmol/L (ref 3.5–5.1)
Sodium: 140 mmol/L (ref 135–145)

## 2017-11-16 LAB — CBC WITH DIFFERENTIAL/PLATELET
Abs Immature Granulocytes: 0.02 10*3/uL (ref 0.00–0.07)
Basophils Absolute: 0 10*3/uL (ref 0.0–0.1)
Basophils Relative: 1 %
Eosinophils Absolute: 0.2 10*3/uL (ref 0.0–0.5)
Eosinophils Relative: 2 %
HCT: 34.5 % — ABNORMAL LOW (ref 39.0–52.0)
Hemoglobin: 10.7 g/dL — ABNORMAL LOW (ref 13.0–17.0)
Immature Granulocytes: 0 %
Lymphocytes Relative: 17 %
Lymphs Abs: 1.3 10*3/uL (ref 0.7–4.0)
MCH: 30.9 pg (ref 26.0–34.0)
MCHC: 31 g/dL (ref 30.0–36.0)
MCV: 99.7 fL (ref 80.0–100.0)
Monocytes Absolute: 0.7 10*3/uL (ref 0.1–1.0)
Monocytes Relative: 8 %
Neutro Abs: 5.6 10*3/uL (ref 1.7–7.7)
Neutrophils Relative %: 72 %
Platelets: 326 10*3/uL (ref 150–400)
RBC: 3.46 MIL/uL — ABNORMAL LOW (ref 4.22–5.81)
RDW: 14.8 % (ref 11.5–15.5)
WBC: 7.8 10*3/uL (ref 4.0–10.5)
nRBC: 0 % (ref 0.0–0.2)

## 2017-11-16 LAB — GLUCOSE, CAPILLARY: Glucose-Capillary: 98 mg/dL (ref 70–99)

## 2017-11-16 LAB — C-REACTIVE PROTEIN: CRP: 5.6 mg/dL — AB (ref <1.0)

## 2017-11-16 LAB — SEDIMENTATION RATE: Sed Rate: 114 mm/hr — ABNORMAL HIGH (ref 0–16)

## 2017-11-16 IMAGING — DX DG HAND COMPLETE 3+V*R*
3 series · 3 of 3 positions shown · non-contrast
Comparison: [DATE]

CLINICAL DATA: Swelling and wound

EXAM:
RIGHT HAND - COMPLETE 3+ VIEW

[hand ap]
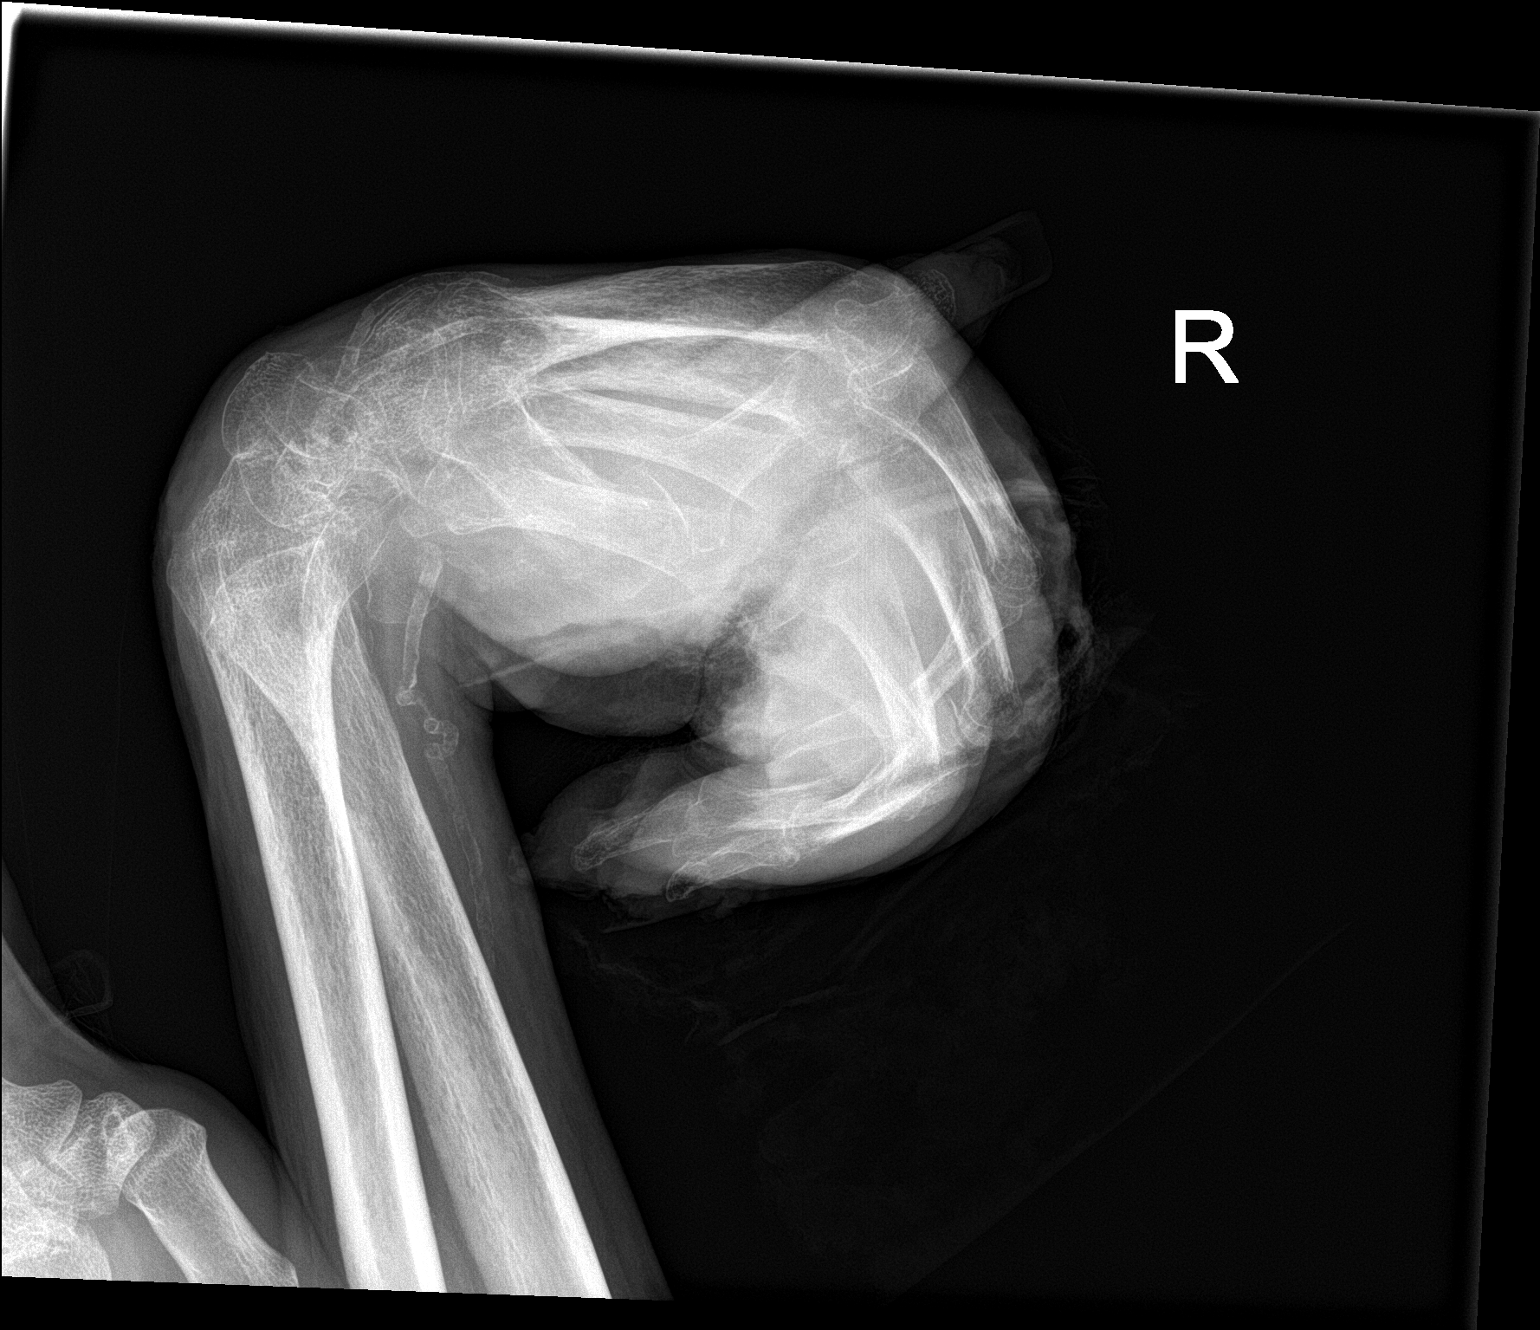

[hand obl]
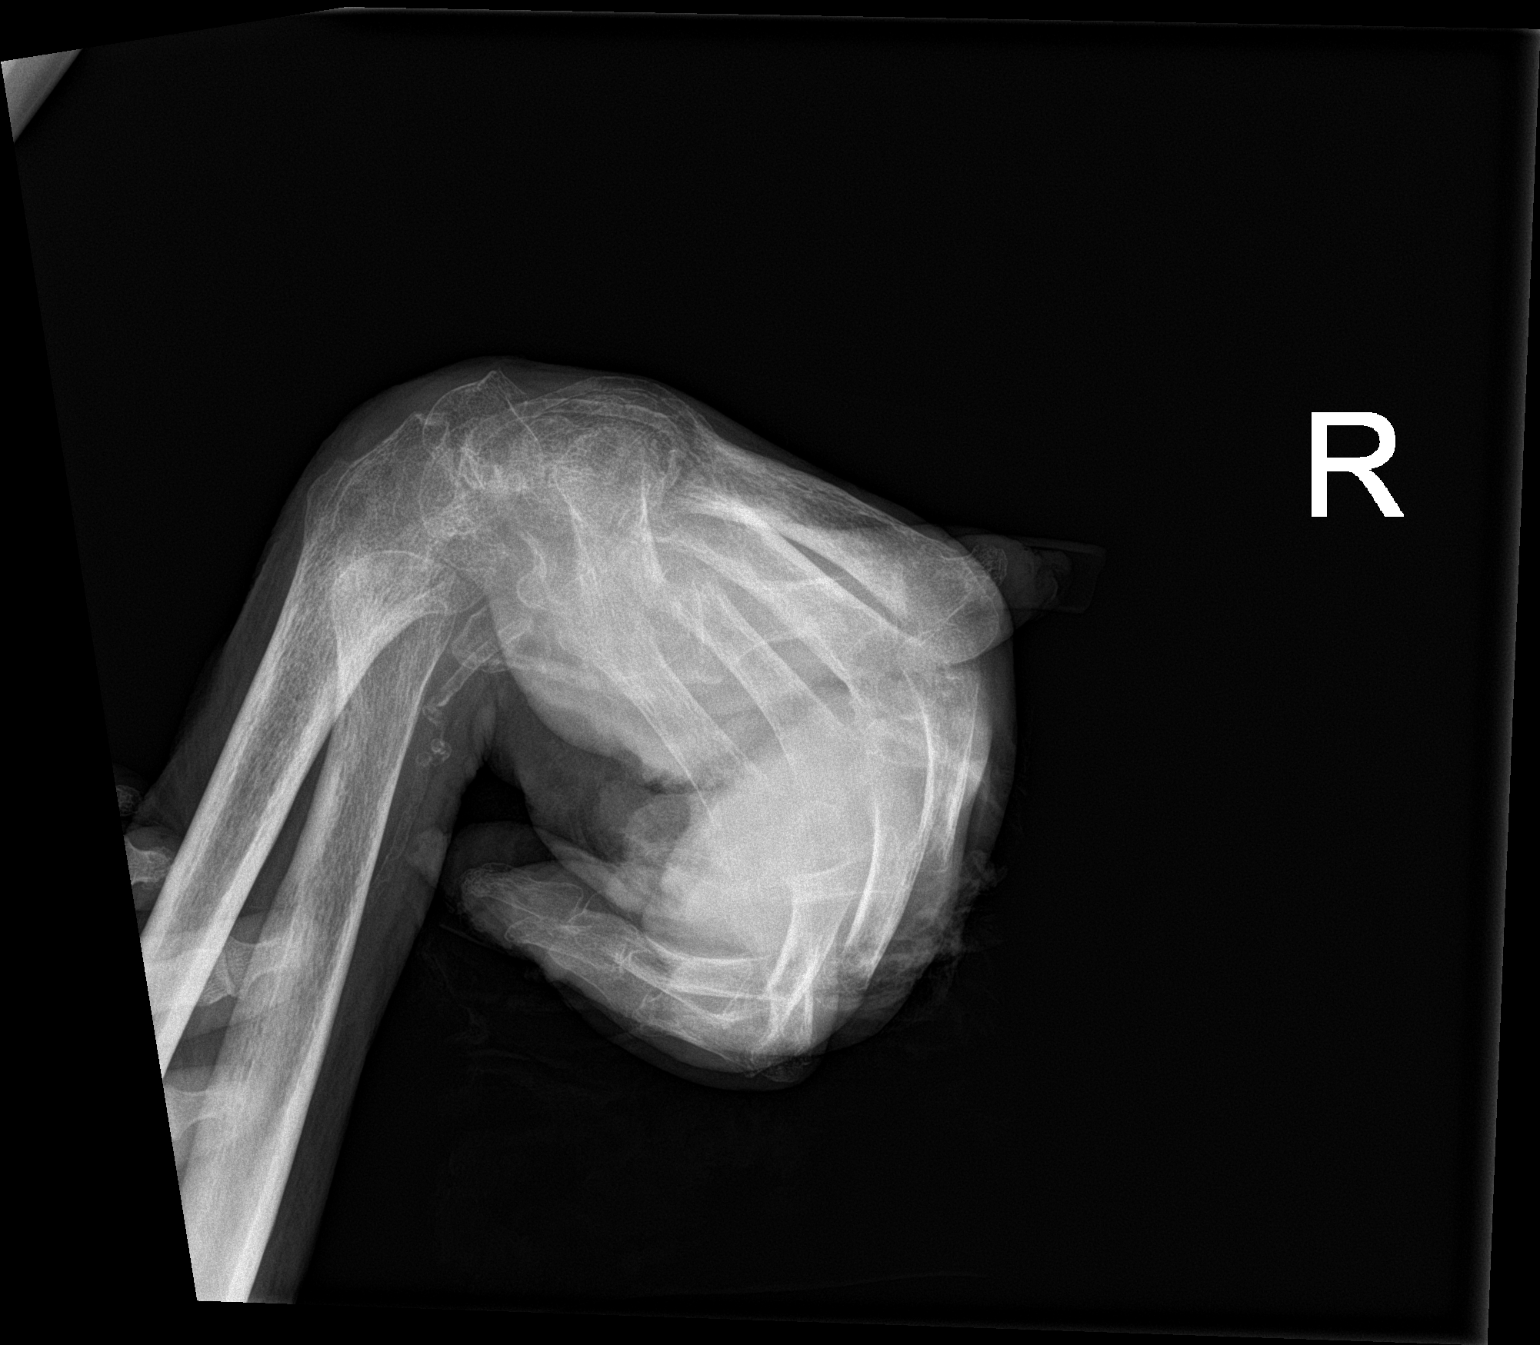

[hand lat]
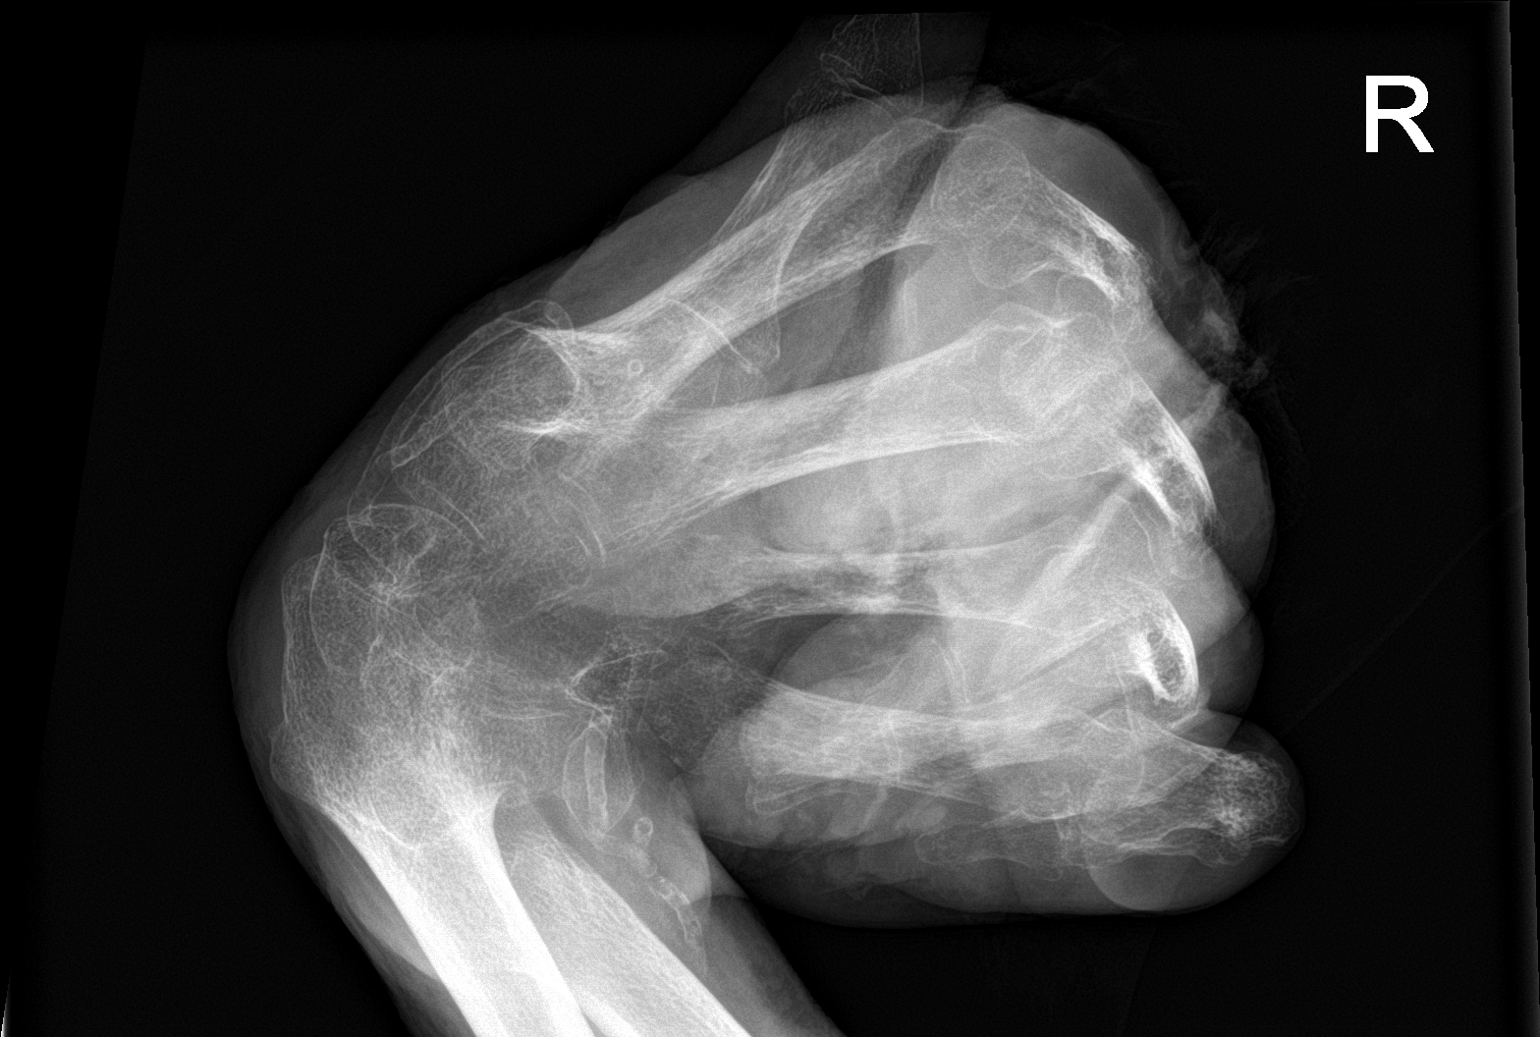

[3 of 3 positions shown; findings below may reference images not displayed]

FINDINGS: Significant contractions are noted about the wrist joint and joints
within the right hand stable from the previous exam. No acute
fracture or bony erosive changes are seen on this exam. Diffuse
vascular calcifications are seen.
IMPRESSION: Significant flexion contractions are again seen and stable. No acute
bony abnormality is noted.

## 2017-11-16 MED ORDER — ONDANSETRON HCL 4 MG/2ML IJ SOLN: 4.0000 mg | Freq: Four times a day (QID) | INTRAMUSCULAR | Status: DC | PRN

## 2017-11-16 MED ORDER — ACETAMINOPHEN 325 MG PO TABS
650.0000 mg | ORAL_TABLET | Freq: Four times a day (QID) | ORAL | Status: DC | PRN
  Administered 2017-11-23 – 2017-11-28 (×2): 650 mg via ORAL
  Filled 2017-11-16 (×2): qty 2

## 2017-11-16 MED ORDER — ONDANSETRON HCL 4 MG PO TABS: 4.0000 mg | ORAL_TABLET | Freq: Four times a day (QID) | ORAL | Status: DC | PRN

## 2017-11-16 MED ORDER — INSULIN ASPART 100 UNIT/ML ~~LOC~~ SOLN
0.0000 [IU] | SUBCUTANEOUS | Status: DC
  Administered 2017-11-17 – 2017-11-19 (×5): 1 [IU] via SUBCUTANEOUS
  Administered 2017-11-19: 2 [IU] via SUBCUTANEOUS
  Administered 2017-11-19 – 2017-11-20 (×2): 1 [IU] via SUBCUTANEOUS
  Administered 2017-11-20: 3 [IU] via SUBCUTANEOUS
  Administered 2017-11-20 (×3): 1 [IU] via SUBCUTANEOUS
  Administered 2017-11-20 – 2017-11-21 (×3): 2 [IU] via SUBCUTANEOUS
  Administered 2017-11-21 – 2017-11-22 (×2): 1 [IU] via SUBCUTANEOUS
  Administered 2017-11-23 – 2017-11-24 (×2): 2 [IU] via SUBCUTANEOUS

## 2017-11-16 MED ORDER — VANCOMYCIN HCL 10 G IV SOLR
1250.0000 mg | Freq: Once | INTRAVENOUS | Status: AC
  Administered 2017-11-16: 1250 mg via INTRAVENOUS
  Filled 2017-11-16: qty 1250

## 2017-11-16 MED ORDER — SODIUM CHLORIDE 0.9% FLUSH
3.0000 mL | Freq: Two times a day (BID) | INTRAVENOUS | Status: DC
  Administered 2017-11-20 – 2017-12-02 (×11): 3 mL via INTRAVENOUS

## 2017-11-16 MED ORDER — SODIUM CHLORIDE 0.9 % IV SOLN
250.0000 mL | INTRAVENOUS | Status: DC | PRN
  Administered 2017-11-16: 250 mL via INTRAVENOUS

## 2017-11-16 MED ORDER — HYDROCODONE-ACETAMINOPHEN 5-325 MG PO TABS
1.0000 | ORAL_TABLET | ORAL | Status: DC | PRN
  Administered 2017-11-16: 2 via ORAL
  Administered 2017-11-18: 1 via ORAL
  Administered 2017-11-20 – 2017-12-02 (×14): 2 via ORAL
  Filled 2017-11-16 (×15): qty 2
  Filled 2017-11-16: qty 1

## 2017-11-16 MED ORDER — ACETAMINOPHEN 650 MG RE SUPP: 650.0000 mg | Freq: Four times a day (QID) | RECTAL | Status: DC | PRN

## 2017-11-16 MED ORDER — SODIUM CHLORIDE 0.9% FLUSH: 3.0000 mL | INTRAVENOUS | Status: DC | PRN
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
## 2017-11-16 NOTE — Progress Notes (Signed)
A consult was received from an ED provider for vancomycin per pharmacy dosing.  The patient's profile has been reviewed for ht/wt/allergies/indication/available labs.     A one time order has been placed for vancomycin IV 1250 mg.  Further antibiotics/pharmacy consults should be ordered by admitting physician if indicated.                       Thank you, Darylene Price [facility] 11/16/2017  6:22 PM

## 2017-11-16 NOTE — ED Notes (Signed)
IV Ultrasound requested.
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
## 2017-11-17 DIAGNOSIS — L89153 Pressure ulcer of sacral region, stage 3: Secondary | ICD-10-CM

## 2017-11-17 DIAGNOSIS — E782 Mixed hyperlipidemia: Secondary | ICD-10-CM

## 2017-11-17 LAB — MRSA PCR SCREENING: MRSA by PCR: NEGATIVE

## 2017-11-17 LAB — PHOSPHORUS: Phosphorus: 2.8 mg/dL (ref 2.5–4.6)

## 2017-11-17 LAB — COMPREHENSIVE METABOLIC PANEL
ALK PHOS: 75 U/L (ref 38–126)
ALT: 9 U/L (ref 0–44)
AST: 14 U/L — AB (ref 15–41)
Albumin: 3.2 g/dL — ABNORMAL LOW (ref 3.5–5.0)
Anion gap: 10 (ref 5–15)
BUN: 22 mg/dL (ref 8–23)
CALCIUM: 8.7 mg/dL — AB (ref 8.9–10.3)
CHLORIDE: 105 mmol/L (ref 98–111)
CO2: 25 mmol/L (ref 22–32)
CREATININE: 0.66 mg/dL (ref 0.61–1.24)
GFR calc non Af Amer: >60 mL/min (ref >60)
Glucose, Bld: 112 mg/dL — ABNORMAL HIGH (ref 70–99)
Potassium: 4.4 mmol/L (ref 3.5–5.1)
Sodium: 140 mmol/L (ref 135–145)
Total Bilirubin: 0.5 mg/dL (ref 0.3–1.2)
Total Protein: 7.8 g/dL (ref 6.5–8.1)

## 2017-11-17 LAB — CBC
HCT: 32.7 % — ABNORMAL LOW (ref 39.0–52.0)
Hemoglobin: 10 g/dL — ABNORMAL LOW (ref 13.0–17.0)
MCH: 30 pg (ref 26.0–34.0)
MCHC: 30.6 g/dL (ref 30.0–36.0)
MCV: 98.2 fL (ref 80.0–100.0)
PLATELETS: 279 10*3/uL (ref 150–400)
RBC: 3.33 MIL/uL — ABNORMAL LOW (ref 4.22–5.81)
RDW: 14.7 % (ref 11.5–15.5)
WBC: 7.3 10*3/uL (ref 4.0–10.5)
nRBC: 0 % (ref 0.0–0.2)

## 2017-11-17 LAB — GLUCOSE, CAPILLARY
GLUCOSE-CAPILLARY: 134 mg/dL — AB (ref 70–99)
GLUCOSE-CAPILLARY: 81 mg/dL (ref 70–99)
Glucose-Capillary: 102 mg/dL — ABNORMAL HIGH (ref 70–99)
Glucose-Capillary: 133 mg/dL — ABNORMAL HIGH (ref 70–99)
Glucose-Capillary: 87 mg/dL (ref 70–99)
Glucose-Capillary: 88 mg/dL (ref 70–99)
Glucose-Capillary: 99 mg/dL (ref 70–99)

## 2017-11-17 LAB — MAGNESIUM: Magnesium: 1.7 mg/dL (ref 1.7–2.4)

## 2017-11-17 MED ORDER — DOXYCYCLINE HYCLATE 100 MG PO TABS
100.0000 mg | ORAL_TABLET | Freq: Two times a day (BID) | ORAL | Status: AC
  Administered 2017-11-17 – 2017-11-18 (×2): 100 mg via ORAL
  Filled 2017-11-17 (×2): qty 1

## 2017-11-17 MED ORDER — INSULIN ASPART 100 UNIT/ML ~~LOC~~ SOLN: 0.0000 [IU] | Freq: Three times a day (TID) | SUBCUTANEOUS | Status: DC

## 2017-11-17 MED ORDER — VANCOMYCIN HCL 10 G IV SOLR
1250.0000 mg | INTRAVENOUS | Status: DC
  Administered 2017-11-18 – 2017-11-20 (×3): 1250 mg via INTRAVENOUS
  Filled 2017-11-17 (×6): qty 1250

## 2017-11-17 NOTE — Progress Notes (Addendum)
PROGRESS NOTE  [name] [name] FXJ:[id number] DOB: 01/04/1946 DOA: 11/16/2017 PCP: Beckie Salts, MD  Brief History:  72 year old male with a history of quadriparesis with a history of contractures, poor compliance, diabetes mellitus type 2, hyperlipidemia, hypertension, iron deficiency, depression, decubitus ulcers presenting with increasing edema and drainage from the right upper extremity.  The patient is a poor historian.  Most of his history is obtained from review the medical record.  The patient usually goes to [facility] to get his wound care, but has not been to the wound care center since 10/19.  At the time of admission, the patient has been afebrile and hemodynamically stable.  He denies any recent injuries or trauma to the hand.  He does state that there is been some increasing edema and drainage.  He states that the amount of erythema is about the same.  Assessment/Plan: Cellulitis right upper extremity -appreciate hand surgery consult-->non operative management-->Patient would benefit from local wound care and short admission for IV antibiotics.  Patient will need at one point in the future possible fractional lengthening of the flexor tendons and possible wrist arthrodesis. -continue vancomycin -spoke with PT--they are able to perform hydrotherapy at [facility] -wound care consult for recommendations for further needs -continue IV vanco  Hyperlipidemia -According to his primary care provider notes, the patient supposed to be on pravastatin 20 mg, but he is not currently taking -lipid panel in am  Diabetes mellitus type 2 -Patient was previously on metformin, but this was discontinued secondary metabolic acidosis in the past -Monitor CBGs -Repeat hemoglobin A1c  Essential hypertension -The patient is supposed to be on amlodipine according to his primary care provider note, but he is currently not taking  Multiple lower extremity wounds -Do not appear infected on clinical  exam -Wound care consult   Disposition Plan:   Home in 1-2 days  Family Communication:   Family at bedside  Consultants:  Hand surgery--[place]  Code Status:  FULL  DVT Prophylaxis:  [place] Lovenox   Procedures: As Listed in Progress Note Above  Antibiotics: vanco 11/2>>>    Subjective: Patient denies fevers, chills, headache, chest pain, dyspnea, nausea, vomiting, diarrhea, abdominal pain, dysuria, hematuria, hematochezia, and melena.   Objective: Vitals:   11/16/17 2113 11/17/17 0105 11/17/17 0446 11/17/17 0933  BP: (!) 147/90 (!) 153/99 93/66 110/78  Pulse: 91 96 79 73  Resp: 15 18 18 18   Temp: 98 F (36.7 C) 97.8 F (36.6 C) 98.3 F (36.8 C) (!) 97.5 F (36.4 C)  TempSrc: Oral Oral  Oral  SpO2: 100% 95% 100% 97%  Weight:      Height:        Intake/Output Summary (Last 24 hours) at 11/17/2017 1203 Last data filed at 11/17/2017 1024 Gross per 24 hour  Intake 675.24 ml  Output 0 ml  Net 675.24 ml   Weight change:  Exam:   General:  Pt is alert, follows commands appropriately, not in acute distress  HEENT: No icterus, No thrush, No neck mass, [place]/AT  Cardiovascular: RRR, S1/S2, no rubs, no gallops  Respiratory: Diminished breath sounds at the bases.  No wheezing.  Bibasilar rales.  Abdomen: Soft/+BS, non tender, non distended, no guarding  Extremities: Multiple wounds of the lower extremities without any drainage, erythema, necrosis.  Right hand wound see pictures.       Data Reviewed: I have personally reviewed following labs and imaging studies Basic Metabolic Panel: Recent Labs  Lab  11/16/17 1700  NA 140  K 4.2  CL 107  CO2 26  GLUCOSE 120*  BUN 22  CREATININE 0.96  CALCIUM 8.8*   Liver Function Tests: No results for input(s): AST, ALT, ALKPHOS, BILITOT, PROT, ALBUMIN in the last 168 hours. No results for input(s): LIPASE, AMYLASE in the last 168 hours. No results for input(s): AMMONIA in the last 168 hours. Coagulation  Profile: No results for input(s): INR, PROTIME in the last 168 hours. CBC: Recent Labs  Lab 11/16/17 1700 11/17/17 0500  WBC 7.8 7.3  NEUTROABS 5.6  --   HGB 10.7* 10.0*  HCT 34.5* 32.7*  MCV 99.7 98.2  PLT 326 279   Cardiac Enzymes: No results for input(s): CKTOTAL, CKMB, CKMBINDEX, TROPONINI in the last 168 hours. BNP: Invalid input(s): POCBNP CBG: Recent Labs  Lab 11/16/17 2211 11/16/17 2349 11/17/17 0430 11/17/17 0738 11/17/17 1148  GLUCAP 98 102* 81 88 87   HbA1C: No results for input(s): HGBA1C in the last 72 hours. Urine analysis:    Component Value Date/Time   COLORURINE YELLOW 02/21/2013 2012   APPEARANCEUR CLOUDY (A) 02/21/2013 2012   LABSPEC 1.018 02/21/2013 2012   PHURINE 5.5 02/21/2013 2012   GLUCOSEU NEGATIVE 02/21/2013 2012   HGBUR SMALL (A) 02/21/2013 2012   BILIRUBINUR NEGATIVE 02/21/2013 2012   [place] NEGATIVE 02/21/2013 2012   PROTEINUR 30 (A) 02/21/2013 2012   UROBILINOGEN 0.2 02/21/2013 2012   NITRITE NEGATIVE 02/21/2013 2012   LEUKOCYTESUR LARGE (A) 02/21/2013 2012   Sepsis Labs: @LABRCNTIP (procalcitonin:4,lacticidven:4) ) Recent Results (from the past 240 hour(s))  MRSA PCR Screening     Status: None   Collection Time: 11/16/17 11:58 PM  Result Value Ref Range Status   MRSA by PCR NEGATIVE NEGATIVE Final    Comment:        The GeneXpert MRSA Assay (FDA approved for NASAL specimens only), is one component of a comprehensive MRSA colonization surveillance program. It is not intended to diagnose MRSA infection nor to guide or monitor treatment for MRSA infections. Performed at [facility], [place] [address], [place], [place] [address]      Scheduled Meds: . insulin aspart  0-9 Units Subcutaneous Q4H  . sodium chloride flush  3 mL Intravenous Q12H   Continuous Infusions: . sodium chloride 250 mL (11/16/17 2224)  . vancomycin      Procedures/Studies: Dg Hand Complete Right  Result Date:  11/16/2017 CLINICAL DATA:  Swelling and wound EXAM: RIGHT HAND - COMPLETE 3+ VIEW COMPARISON:  08/23/2017 FINDINGS: Significant contractions are noted about the wrist joint and joints within the right hand stable from the previous exam. No acute fracture or bony erosive changes are seen on this exam. Diffuse vascular calcifications are seen. IMPRESSION: Significant flexion contractions are again seen and stable. No acute bony abnormality is noted. Electronically Signed   By: Inez Catalina M.D.   On: 11/16/2017 17:13    Orson Eva, DO  [facility] Pager [phone]  If 7PM-7AM, please contact night-coverage [email or web address] Password [email or web address] 11/17/2017, 12:03 PM   LOS: 1 day

## 2017-11-17 NOTE — Progress Notes (Signed)
PT Cancellation Note  Patient Details Name: [name] [name] MRN: [id number] DOB: 04-24-1945   Cancelled Treatment:    Reason Eval/Treat Not Completed: Other (comment); spoke with MD--ok to begin hydro on Monday; Kenyon Ana, PT  Pager: [phone] Acute Rehab Dept [facility]): [phone]   11/17/2017     [facility] 11/17/2017, 1:40 PM

## 2017-11-17 NOTE — Progress Notes (Signed)
Pharmacy Antibiotic Note  [name] [name] is a 72 y.o. male admitted on 11/16/2017 with wound.  Pharmacy has been consulted for vancomycin dosing.  Plan: Vancomycin 1250 mg IV q24h for est AUC = 517 Goal AUC = 400-500 F/u scr/cultures/levels  Height: 5' 7.5" (171.5 cm) Weight: 145 lb (65.8 kg) IBW/kg (Calculated) : 67.25  Temp (24hrs), Avg:98.2 F (36.8 C), Min:97.8 F (36.6 C), Max:98.8 F (37.1 C)  Recent Labs  Lab 11/16/17 1700  WBC 7.8  CREATININE 0.96    Estimated Creatinine Clearance: 64.7 mL/min (by C-G formula based on SCr of 0.96 mg/dL).    Allergies  Allergen Reactions  . Lisinopril Other (See Comments)    Hyperkalemia.    Antimicrobials this admission: 11/2 vancomycin >>    >>   Dose adjustments this admission:   Microbiology results:  BCx:   UCx:    Sputum:    MRSA PCR:   Thank you for allowing pharmacy to be a part of this patient's care.  Dorrene German 11/17/2017 2:39 AM

## 2017-11-17 NOTE — Progress Notes (Signed)
Per lab patient is very difficult stick and had attempted to collect AM lab work 5 times. Enough blood was collected for lab work except for TSH and A1c. MD paged and remaining lab work moved to tomorrow morning due to patient becoming agitated from multiple sticks

## 2017-11-17 NOTE — Progress Notes (Signed)
IV team nurse was unable to find placement for a new IV site. MD notified and was informed about probable central line placement tomorrow.

## 2017-11-18 ENCOUNTER — Inpatient Hospital Stay (HOSPITAL_COMMUNITY): Payer: Medicare Other

## 2017-11-18 DIAGNOSIS — E440 Moderate protein-calorie malnutrition: Secondary | ICD-10-CM

## 2017-11-18 LAB — GLUCOSE, CAPILLARY
GLUCOSE-CAPILLARY: 130 mg/dL — AB (ref 70–99)
Glucose-Capillary: 149 mg/dL — ABNORMAL HIGH (ref 70–99)

## 2017-11-18 MED ORDER — JUVEN PO PACK
1.0000 | PACK | Freq: Two times a day (BID) | ORAL | Status: DC
  Administered 2017-11-18 – 2017-12-02 (×22): 1 via ORAL
  Filled 2017-11-18 (×29): qty 1

## 2017-11-18 MED ORDER — COLLAGENASE 250 UNIT/GM EX OINT
TOPICAL_OINTMENT | Freq: Every day | CUTANEOUS | Status: DC
  Administered 2017-11-18 – 2017-12-01 (×12): via TOPICAL
  Filled 2017-11-18 (×2): qty 90

## 2017-11-18 MED ORDER — ENOXAPARIN SODIUM 40 MG/0.4ML ~~LOC~~ SOLN
40.0000 mg | SUBCUTANEOUS | Status: DC
  Administered 2017-11-18 – 2017-12-01 (×13): 40 mg via SUBCUTANEOUS
  Filled 2017-11-18 (×13): qty 0.4

## 2017-11-18 MED ORDER — SODIUM CHLORIDE 0.9% FLUSH
10.0000 mL | INTRAVENOUS | Status: DC | PRN
  Administered 2017-11-18 – 2017-11-22 (×2): 10 mL
  Filled 2017-11-18 (×2): qty 40

## 2017-11-18 MED ORDER — ADULT MULTIVITAMIN W/MINERALS CH
1.0000 | ORAL_TABLET | Freq: Every day | ORAL | Status: DC
  Administered 2017-11-18 – 2017-12-02 (×14): 1 via ORAL
  Filled 2017-11-18 (×14): qty 1

## 2017-11-18 MED ORDER — AMLODIPINE BESYLATE 5 MG PO TABS
5.0000 mg | ORAL_TABLET | Freq: Every day | ORAL | Status: DC
  Administered 2017-11-18 – 2017-11-28 (×9): 5 mg via ORAL
  Filled 2017-11-18 (×10): qty 1

## 2017-11-18 NOTE — Progress Notes (Signed)
Physical therapy Note. Call to Dr. Bertis Ruddy office, spoke with Sula Soda, RN to let Dr. Burney Gauze know of PT's recommendation that PLS is PT's only option and would be very difficult to administer on this patient's very painful hand. No hydrotherapy is anticipated  To begin at this time.   [place] Pager [phone] Office [phone]
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
## 2017-11-18 NOTE — Progress Notes (Signed)
Request to IR for PICC placement due to difficult IV access.  Patient presented to IR suite from floor with left midline in place and IV fluids infusing.   No procedure in IR today given above, order has been cancelled and patient returned to floor.  Please call IR with questions or concerns.  Candiss Norse, PA-C

## 2017-11-18 NOTE — Progress Notes (Addendum)
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  GOAL:   Patient will meet greater than or equal to 90% of their needs  MONITOR:   PO intake, Supplement acceptance, Labs, Weight trends, Skin, I & O's  REASON FOR ASSESSMENT:   Consult Malnutrition Eval  ASSESSMENT:   72 year old male with a history of quadriparesis with a history of contractures, poor compliance, diabetes mellitus type 2, hyperlipidemia, hypertension, iron deficiency, depression, decubitus ulcers presenting with increasing edema and drainage from the right upper extremity.    Patient not a good historian. Currently consuming 25-50% of meals over the past 24 hours, on average receiving 300 kcal and 12g protein per meal. Given extent of wounds, would recommend Juven supplements and daily multivitamin.   Per weight records, pt has gained 24 lb since 9/11. Suspect this is related to fluid.  Medications reviewed. Labs reviewed: CBGs: 130-134 Mg/Phos WNL  NUTRITION - FOCUSED PHYSICAL EXAM:  Depletions may be more related to quadriparesis.    Most Recent Value  Orbital Region  Severe depletion  Upper Arm Region  Moderate depletion  Thoracic and Lumbar Region  Unable to assess  Buccal Region  Severe depletion  Temple Region  Severe depletion  Clavicle Bone Region  Severe depletion  Clavicle and Acromion Bone Region  Severe depletion  Scapular Bone Region  Unable to assess  Dorsal Hand  Unable to assess  Patellar Region  Severe  depletion  Anterior Thigh Region  Severe depletion  Posterior Calf Region  Severe depletion  Edema (RD Assessment)  Mild       Diet Order:   Diet Order            Diet Carb Modified Fluid consistency: Thin; Room service appropriate? Yes  Diet effective now              EDUCATION NEEDS:   Not appropriate for education at this time  Skin:  Skin Assessment: Skin Integrity Issues: Skin Integrity Issues:: Stage II, Stage III, Other (Comment) Stage II: coccyx Stage III: sacral Other: open wound on rt hand, right pretibial non-pressure wound  Last BM:  11/4  Height:   Ht Readings from Last 1 Encounters:  11/16/17 5' 7.5" (1.715 m)    Weight:   Wt Readings from Last 1 Encounters:  11/16/17 65.8 kg    Ideal Body Weight:  63 kg(adjusted for quadriplegia)  BMI:  Body mass index is 22.38 kg/m.  Estimated Nutritional Needs:   Kcal:  1700-1900  Protein:  100-110g  Fluid:  1.7L/day   Clayton Bibles, MS, RD, LDN [place] [facility] Pager: [phone] After Hours Pager: [phone]

## 2017-11-18 NOTE — Progress Notes (Signed)
Physical Therapy note- Per Dr. Bertis Ruddy  RN, Mathis Dad, patient is to be transferred to [facility]. [place] Pager [phone] Office [phone]

## 2017-11-18 NOTE — Evaluation (Signed)
Occupational Therapy Evaluation Patient Details Name: [name] [name] MRN: [id number] DOB: 05/08/1945 Today's Date: 11/18/2017    History of Present Illness 72 year old male with a history of quadriparesis with a history of contractures, poor compliance, diabetes mellitus type 2, hyperlipidemia, hypertension, iron deficiency, depression, decubitus ulcers presenting with increasing edema and drainage from the right upper extremity.  The patient is a poor historian.  Most of his history is obtained from review the medical record.  The patient usually goes to [facility] to get his wound care, but has not been to the wound care center since 10/19.  At the time of admission, the patient has been afebrile and hemodynamically stable.  He denies any recent injuries or trauma to the hand.  He does state that there is been some increasing edema and drainage   Clinical Impression   Pt admitted with the above. Pt currently with functional limitations due to the deficits listed below (see OT Problem List).  Pt will benefit from skilled OT to increase their safety and independence with ADL and functional mobility for ADL to facilitate discharge to venue listed below.      Follow Up Recommendations  SNF;Home health OT;Supervision/Assistance - 24 hour - unsure if family can care for pt   Equipment Recommendations       Recommendations for Other Services       Precautions / Restrictions Precautions Precautions: Fall      Mobility Bed Mobility               General bed mobility comments: refused  Transfers                 General transfer comment: refused    Balance                                           ADL either performed or assessed with clinical judgement   ADL Overall ADL's : Needs assistance/impaired                                       General ADL Comments: pt appears dependent with ADL activity.  Declined sitting up and wanted bed  totally flat     Vision Patient Visual Report: No change from baseline       Perception     Praxis      Pertinent Vitals/Pain Pain Assessment: Faces Faces Pain Scale: Hurts little more Pain Location: with attempted finger movement Pain Descriptors / Indicators: Grimacing Pain Intervention(s): Limited activity within patient's tolerance     Hand Dominance     Extremity/Trunk Assessment Upper Extremity Assessment Upper Extremity Assessment: RUE deficits/detail RUE Deficits / Details: wrist and hand contracture- VERY limited PROM. Wrist fixed at 90 degrees and fingers in flexed position           Communication     Cognition   Behavior During Therapy: Restless Overall Cognitive Status: No family/caregiver present to determine baseline cognitive functioning                                     General Comments   OT session focused on positioning RUE / Placed on pillow with elevation.  Pts fingers and hand with significant  edema.  With contractures of hand positioning   challenging.  Placed black round pillow pt had under elbow .  Lower arm elevated , but wrist and hand in flexion           Home Living Family/patient expects to be discharged to:: Private residence Living Arrangements: Children                                       OT Goals(Current goals can be found in the care plan section) Acute Rehab OT Goals Patient Stated Goal: did not state Time For Goal Achievement: 11/18/17  OT Frequency: Min 2X/week           AM-PAC PT "6 Clicks" Daily Activity     Outcome Measure Help from another person eating meals?: Total Help from another person taking care of personal grooming?: Total Help from another person toileting, which includes using toliet, bedpan, or urinal?: Total Help from another person bathing (including washing, rinsing, drying)?: Total Help from another person to put on and taking off regular upper body clothing?:  Total Help from another person to put on and taking off regular lower body clothing?: Total 6 Click Score: 6   End of Session    Activity Tolerance: Patient limited by pain(finger ROM) Patient left: in bed  OT Visit Diagnosis: Muscle weakness (generalized) (M62.81)                 Kari Baars, OT Acute Rehabilitation Services [phone] Office- [phone]     Lin Landsman, Edwena Felty D 11/18/2017, 8:46 PM

## 2017-11-18 NOTE — Progress Notes (Addendum)
PT Cancellation Note  Patient Details Name: Rigdon Macomber MRN: [id number] DOB: [birth year]   Cancelled Treatment:    Reason Eval/Treat Not Completed: PT screened, no needs identified, will sign offpatient's right hand was assessed by  This Probation officer and Carmelia Bake, DPT for indication for hydrotherapy. The patient's right hand has purulent drainage, fingers are stuck together from drainage. I was able to abduct a small amount to separate the fingers. and contracted so visualization of palmer surface was not possible. The  Arm is contracted in flexed posture and pronated. Previous epic notes indicate that the hand wounds have been present at least since 08/2016. Pictures are in epic. Epic indicates that the patient follows  [facility]. At this time, PT determined that the wounds are not amenable to Pulsatile lavage and  Patient  Does not tolerate pressure/ touching the hand   . The  Patient was unable to tolerate removal of current dressing and requested that PT stops. Patient stated: Just cut it off."  RN notified of PT recommending  That Hydrotherapy is not indicated. Will notify physician .  Patient also requires total care and transfers PTA. No skilled PT needs at this time.   Claretha Cooper 11/18/2017, 1:03 PM  [place] Pager [phone] Office [phone]
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
## 2017-11-18 NOTE — Progress Notes (Signed)
PROGRESS NOTE  [name] [name] KCL:[id number] DOB: Jul 27, 1945 DOA: 11/16/2017 PCP: Beckie Salts, MD  Brief History:  72 year old male with a history of quadriparesis with a history of contractures, poor compliance, diabetes mellitus type 2, hyperlipidemia, hypertension, iron deficiency, depression, decubitus ulcers presenting with increasing edema and drainage from the right upper extremity.  The patient is a poor historian.  Most of his history is obtained from review the medical record.  The patient usually goes to [facility] to get his wound care, but has not been to the wound care center since 10/19.  At the time of admission, the patient has been afebrile and hemodynamically stable.  He denies any recent injuries or trauma to the hand.  He does state that there is been some increasing edema and drainage.  He states that the amount of erythema is about the same. Pt admitted for further management  Assessment/Plan: Cellulitis right upper extremity Currently afebrile, with no leukocytosis -appreciate hand surgery consult-->non operative management-->Patient would benefit from local wound care and short admission for IV antibiotics.  Patient will need at one point in the future possible fractional lengthening of the flexor tendons and possible wrist arthrodesis. Wound care on board- appreciate recs, hydrotherapy PT on board: Not a candidate for hydrotherapy, please see PT note for details Continue IV vanco  Diabetes mellitus type 2 -Patient was previously on metformin, but this was discontinued secondary metabolic acidosis in the past -Monitor CBGs, if needed will start SSI  Essential hypertension -The patient is supposed to be on amlodipine according to his primary care provider note, but he is currently not taking Restart amlodipine  Normocytic anemia Hgb at baseline Monitor for signs of bleeding  Multiple lower extremity wounds -Do not appear infected on clinical  exam -Wound care consult   Disposition Plan: Likely home in 1-2 days  Family Communication: None at bedside  Consultants:  Hand surgery--Weingold  Code Status:  FULL  DVT Prophylaxis:  [place] Lovenox   Procedures: None  Antibiotics: vanco 11/2>>>    Subjective: Patient denies any new complaint.   Objective: Vitals:   11/17/17 2045 11/18/17 0448 11/18/17 0500 11/18/17 1217  BP: 113/70 (!) 137/38 138/65 (!) 133/91  Pulse: 91 93  96  Resp: 16 18  18   Temp: 99.6 F (37.6 C) 99.7 F (37.6 C)  98.6 F (37 C)  TempSrc: Oral Oral  Oral  SpO2: 100% 100%  100%  Weight:      Height:        Intake/Output Summary (Last 24 hours) at 11/18/2017 1620 Last data filed at 11/18/2017 1607 Gross per 24 hour  Intake 970 ml  Output 1810 ml  Net -840 ml   Weight change:  Exam:  General: NAD   Cardiovascular: S1, S2 present  Respiratory: CTAB  Abdomen: Soft, nontender, nondistended, bowel sounds present  Musculoskeletal: No bilateral pedal edema noted, Multiple chronic lesions noted on BLE. R hand noted as below  Skin: As mentioned  Psychiatry: No new focal neurologic deficits noted        Data Reviewed: I have personally reviewed following labs and imaging studies Basic Metabolic Panel: Recent Labs  Lab 11/16/17 1700 11/17/17 1347  NA 140 140  K 4.2 4.4  CL 107 105  CO2 26 25  GLUCOSE 120* 112*  BUN 22 22  CREATININE 0.96 0.66  CALCIUM 8.8* 8.7*  MG  --  1.7  PHOS  --  2.8  Liver Function Tests: Recent Labs  Lab 11/17/17 1347  AST 14*  ALT 9  ALKPHOS 75  BILITOT 0.5  PROT 7.8  ALBUMIN 3.2*   No results for input(s): LIPASE, AMYLASE in the last 168 hours. No results for input(s): AMMONIA in the last 168 hours. Coagulation Profile: No results for input(s): INR, PROTIME in the last 168 hours. CBC: Recent Labs  Lab 11/16/17 1700 11/17/17 0500  WBC 7.8 7.3  NEUTROABS 5.6  --   HGB 10.7* 10.0*  HCT 34.5* 32.7*  MCV 99.7 98.2  PLT 326  279   Cardiac Enzymes: No results for input(s): CKTOTAL, CKMB, CKMBINDEX, TROPONINI in the last 168 hours. BNP: Invalid input(s): POCBNP CBG: Recent Labs  Lab 11/17/17 1148 11/17/17 1619 11/17/17 2042 11/17/17 2333 11/18/17 1118  GLUCAP 87 99 133* 134* 130*   HbA1C: No results for input(s): HGBA1C in the last 72 hours. Urine analysis:    Component Value Date/Time   COLORURINE YELLOW 02/21/2013 2012   APPEARANCEUR CLOUDY (A) 02/21/2013 2012   LABSPEC 1.018 02/21/2013 2012   PHURINE 5.5 02/21/2013 2012   GLUCOSEU NEGATIVE 02/21/2013 2012   HGBUR SMALL (A) 02/21/2013 2012   BILIRUBINUR NEGATIVE 02/21/2013 2012   [place] NEGATIVE 02/21/2013 2012   PROTEINUR 30 (A) 02/21/2013 2012   UROBILINOGEN 0.2 02/21/2013 2012   NITRITE NEGATIVE 02/21/2013 2012   LEUKOCYTESUR LARGE (A) 02/21/2013 2012   Sepsis Labs: @LABRCNTIP (procalcitonin:4,lacticidven:4) ) Recent Results (from the past 240 hour(s))  MRSA PCR Screening     Status: None   Collection Time: 11/16/17 11:58 PM  Result Value Ref Range Status   MRSA by PCR NEGATIVE NEGATIVE Final    Comment:        The GeneXpert MRSA Assay (FDA approved for NASAL specimens only), is one component of a comprehensive MRSA colonization surveillance program. It is not intended to diagnose MRSA infection nor to guide or monitor treatment for MRSA infections. Performed at [facility], [place] [address], [place], [place] [address]      Scheduled Meds: . collagenase   Topical Daily  . insulin aspart  0-9 Units Subcutaneous Q4H  . multivitamin with minerals  1 tablet Oral Daily  . nutrition supplement (JUVEN)  1 packet Oral BID BM  . sodium chloride flush  3 mL Intravenous Q12H   Continuous Infusions: . sodium chloride 250 mL (11/16/17 2224)  . vancomycin      Procedures/Studies: Dg Hand Complete Right  Result Date: 11/16/2017 CLINICAL DATA:  Swelling and wound EXAM: RIGHT HAND - COMPLETE 3+ VIEW COMPARISON:   08/23/2017 FINDINGS: Significant contractions are noted about the wrist joint and joints within the right hand stable from the previous exam. No acute fracture or bony erosive changes are seen on this exam. Diffuse vascular calcifications are seen. IMPRESSION: Significant flexion contractions are again seen and stable. No acute bony abnormality is noted. Electronically Signed   By: Inez Catalina M.D.   On: 11/16/2017 17:13    Alma Friendly, MD  [facility]   If 7PM-7AM, please contact night-coverage [email or web address] 11/18/2017, 4:20 PM   LOS: 2 days

## 2017-11-19 LAB — GLUCOSE, CAPILLARY
GLUCOSE-CAPILLARY: 100 mg/dL — AB (ref 70–99)
GLUCOSE-CAPILLARY: 197 mg/dL — AB (ref 70–99)
GLUCOSE-CAPILLARY: 82 mg/dL (ref 70–99)
GLUCOSE-CAPILLARY: 97 mg/dL (ref 70–99)
Glucose-Capillary: 149 mg/dL — ABNORMAL HIGH (ref 70–99)
Glucose-Capillary: 123 mg/dL — ABNORMAL HIGH (ref 70–99)

## 2017-11-19 NOTE — Progress Notes (Signed)
PROGRESS NOTE  [name] [name] MGQ:[id number] DOB: [birth year] DOA: 11/16/2017 PCP: Beckie Salts, MD  Brief History:  72 year old male with a history of quadriparesis with a history of contractures, poor compliance, diabetes mellitus type 2, hyperlipidemia, hypertension, iron deficiency, depression, decubitus ulcers presenting with increasing edema and drainage from the right upper extremity.  The patient is a poor historian.  Most of his history is obtained from review the medical record.  The patient usually goes to [facility] to get his wound care, but has not been to the wound care center since 10/19.  At the time of admission, the patient has been afebrile and hemodynamically stable.  He denies any recent injuries or trauma to the hand.  He does state that there is been some increasing edema and drainage.  He states that the amount of erythema is about the same. Pt admitted for further management  Assessment/Plan: Cellulitis right upper extremity Currently afebrile, with no leukocytosis -appreciate hand surgery consult-->non operative management-->Patient would benefit from local wound care and short admission for IV antibiotics.  Patient will need at one point in the future possible fractional lengthening of the flexor tendons and possible wrist arthrodesis. Wound care on board- appreciate recs, hydrotherapy PT on board: Not a candidate for hydrotherapy, please see PT note for details Orthopedics, Dr Bertis Ruddy recommending transfer to [facility] for further evaluation for possible surgery Continue IV vanco  Diabetes mellitus type 2 -Patient was previously on metformin, but this was discontinued secondary metabolic acidosis in the past -Monitor CBGs, if needed will start SSI  Essential hypertension -The patient is supposed to be on amlodipine according to his primary care provider note, but he is currently not taking Restart amlodipine  Normocytic anemia Hgb at baseline Monitor for  signs of bleeding  Multiple lower extremity wounds -Do not appear infected on clinical exam -Wound care consult   Disposition Plan: Pending orthopedics  Family Communication: None at bedside  Consultants:  Hand surgery--Weingold  Code Status:  FULL  DVT Prophylaxis:  [place] Lovenox   Procedures: None  Antibiotics: vanco 11/2>>>    Subjective: Pt denies any new complaints.   Objective: Vitals:   11/18/17 2120 11/19/17 0649 11/19/17 1000 11/19/17 1054  BP: (!) 151/91 134/86    Pulse: 95 100  91  Resp: 18 18  19   Temp: 98.8 F (37.1 C) 97.9 F (36.6 C) 98 F (36.7 C) 98 F (36.7 C)  TempSrc: Oral Oral  Oral  SpO2: 100% 97%  99%  Weight:      Height:        Intake/Output Summary (Last 24 hours) at 11/19/2017 1704 Last data filed at 11/19/2017 1304 Gross per 24 hour  Intake 795.16 ml  Output 1975 ml  Net -1179.84 ml   Weight change:  Exam:  General: NAD   Cardiovascular: S1, S2 present  Respiratory: CTAB  Abdomen: Soft, nontender, nondistended, bowel sounds present  Musculoskeletal: No bilateral pedal edema noted. R hand picture below. Multiple chronic scabs noted on BLE  Skin: Normal  Psychiatry: No new focal neurologic deficits noted         Data Reviewed: I have personally reviewed following labs and imaging studies Basic Metabolic Panel: Recent Labs  Lab 11/16/17 1700 11/17/17 1347  NA 140 140  K 4.2 4.4  CL 107 105  CO2 26 25  GLUCOSE 120* 112*  BUN 22 22  CREATININE 0.96 0.66  CALCIUM 8.8* 8.7*  MG  --  1.7  PHOS  --  2.8   Liver Function Tests: Recent Labs  Lab 11/17/17 1347  AST 14*  ALT 9  ALKPHOS 75  BILITOT 0.5  PROT 7.8  ALBUMIN 3.2*   No results for input(s): LIPASE, AMYLASE in the last 168 hours. No results for input(s): AMMONIA in the last 168 hours. Coagulation Profile: No results for input(s): INR, PROTIME in the last 168 hours. CBC: Recent Labs  Lab 11/16/17 1700 11/17/17 0500  WBC 7.8 7.3    NEUTROABS 5.6  --   HGB 10.7* 10.0*  HCT 34.5* 32.7*  MCV 99.7 98.2  PLT 326 279   Cardiac Enzymes: No results for input(s): CKTOTAL, CKMB, CKMBINDEX, TROPONINI in the last 168 hours. BNP: Invalid input(s): POCBNP CBG: Recent Labs  Lab 11/19/17 0022 11/19/17 0354 11/19/17 0729 11/19/17 1210 11/19/17 1627  GLUCAP 100* 82 97 149* 123*   HbA1C: No results for input(s): HGBA1C in the last 72 hours. Urine analysis:    Component Value Date/Time   COLORURINE YELLOW 02/21/2013 2012   APPEARANCEUR CLOUDY (A) 02/21/2013 2012   LABSPEC 1.018 02/21/2013 2012   PHURINE 5.5 02/21/2013 2012   GLUCOSEU NEGATIVE 02/21/2013 2012   HGBUR SMALL (A) 02/21/2013 2012   BILIRUBINUR NEGATIVE 02/21/2013 2012   [place] NEGATIVE 02/21/2013 2012   PROTEINUR 30 (A) 02/21/2013 2012   UROBILINOGEN 0.2 02/21/2013 2012   NITRITE NEGATIVE 02/21/2013 2012   LEUKOCYTESUR LARGE (A) 02/21/2013 2012   Sepsis Labs: @LABRCNTIP (procalcitonin:4,lacticidven:4) ) Recent Results (from the past 240 hour(s))  MRSA PCR Screening     Status: None   Collection Time: 11/16/17 11:58 PM  Result Value Ref Range Status   MRSA by PCR NEGATIVE NEGATIVE Final    Comment:        The GeneXpert MRSA Assay (FDA approved for NASAL specimens only), is one component of a comprehensive MRSA colonization surveillance program. It is not intended to diagnose MRSA infection nor to guide or monitor treatment for MRSA infections. Performed at [facility], [place] [address] [place], [place] [address]      Scheduled Meds: . amLODipine  5 mg Oral Daily  . collagenase   Topical Daily  . enoxaparin (LOVENOX) injection  40 mg Subcutaneous Q24H  . insulin aspart  0-9 Units Subcutaneous Q4H  . multivitamin with minerals  1 tablet Oral Daily  . nutrition supplement (JUVEN)  1 packet Oral BID BM  . sodium chloride flush  3 mL Intravenous Q12H   Continuous Infusions: . sodium chloride 250 mL (11/16/17 2224)   . vancomycin 1,250 mg (11/18/17 1733)    Procedures/Studies: Dg Hand Complete Right  Result Date: 11/16/2017 CLINICAL DATA:  Swelling and wound EXAM: RIGHT HAND - COMPLETE 3+ VIEW COMPARISON:  08/23/2017 FINDINGS: Significant contractions are noted about the wrist joint and joints within the right hand stable from the previous exam. No acute fracture or bony erosive changes are seen on this exam. Diffuse vascular calcifications are seen. IMPRESSION: Significant flexion contractions are again seen and stable. No acute bony abnormality is noted. Electronically Signed   By: Inez Catalina M.D.   On: 11/16/2017 17:13    Alma Friendly, MD  [facility]   If 7PM-7AM, please contact night-coverage [email or web address] 11/19/2017, 5:04 PM   LOS: 3 days

## 2017-11-19 NOTE — Progress Notes (Signed)
Occupational Therapy Treatment Patient Details Name: [name] [name] MRN: [id number] DOB: 1945/03/25 Today's Date: 11/19/2017    History of present illness 72 year old male with a history of quadriparesis with a history of contractures, poor compliance, diabetes mellitus type 2, hyperlipidemia, hypertension, iron deficiency, depression, decubitus ulcers presenting with increasing edema and drainage from the right upper extremity.  The patient is a poor historian.  Most of his history is obtained from review the medical record.  The patient usually goes to [facility] to get his wound care, but has not been to the wound care center since 10/19.  At the time of admission, the patient has been afebrile and hemodynamically stable.  He denies any recent injuries or trauma to the hand.  He does state that there is been some increasing edema and drainage   OT comments  Adaptive cup issued and pt successful in use  Follow Up Recommendations  SNF;Home health OT;Supervision/Assistance - 24 hour    Equipment Recommendations  None recommended by OT    Recommendations for Other Services      Precautions / Restrictions Precautions Precautions: Fall Precaution Comments: L UE contractures and wounds       Mobility Bed Mobility               General bed mobility comments: refused  Transfers                 General transfer comment: refused        ADL either performed or assessed with clinical judgement   ADL Overall ADL's : Needs assistance/impaired Eating/Feeding: Bed level;Moderate assistance Eating/Feeding Details (indicate cue type and reason): HOB raised.  Adaptive cup provided. Pt able to use fork and feed self if tray in a good position                       Toilet Transfer Details (indicate cue type and reason): pt reports he mostly uses bed pan at home           General ADL Comments: pt did agree to [facility] being raised for self feeding activity .                  Cognition     Overall Cognitive Status: No family/caregiver present to determine baseline cognitive functioning                                                     Pertinent Vitals/ Pain       Pain Assessment: No/denies pain  Home Living Family/patient expects to be discharged to:: Private residence Living Arrangements: Children Available Help at Discharge: Family;Available 24 hours/day Type of Home: House       Home Layout: One level               Home Equipment: Wheelchair - manual   Additional Comments: pt reports he takes a bird bath but does have a roll in shower      Prior Functioning/Environment Level of Independence: Needs assistance            Frequency  Min 2X/week        Progress Toward Goals  OT Goals(current goals can now be found in the care plan section)     Acute Rehab OT Goals Patient Stated Goal: go home to  my daugthers OT Goal Formulation: With patient Time For Goal Achievement: 11/25/17 Potential to Achieve Goals: Good  Plan      Co-evaluation                 AM-PAC PT "6 Clicks" Daily Activity     Outcome Measure   Help from another person eating meals?: A Lot Help from another person taking care of personal grooming?: Total Help from another person toileting, which includes using toliet, bedpan, or urinal?: Total Help from another person bathing (including washing, rinsing, drying)?: Total Help from another person to put on and taking off regular upper body clothing?: Total Help from another person to put on and taking off regular lower body clothing?: Total 6 Click Score: 7    End of Session    OT Visit Diagnosis: Muscle weakness (generalized) (M62.81)   Activity Tolerance Treatment limited secondary to agitation(pt aggravated with OT asking him to do actiivity)   Patient Left in bed   Nurse Communication Other (comment)(need for adaptive cup and adequate positioning for pt to  be sucecesful with self feeding)        Time: 7416-3845 OT Time Calculation (min): 20 min  Charges: OT General Charges $OT Visit: 1 Visit OT Treatments $Self Care/Home Management : 8-22 mins  Kari Baars, OT Acute Rehabilitation Services [phone] Office- [phone]      [place], Edwena Felty D 11/19/2017, 1:29 PM

## 2017-11-19 NOTE — Plan of Care (Signed)
  Problem: Safety: Goal: Ability to remain free from injury will improve Outcome: Progressing   

## 2017-11-19 NOTE — Care Management Important Message (Signed)
Important Message  Patient Details  Name: [name] [name] MRN: [id number] Date of Birth: Dec 20, 1945   Medicare Important Message Given:  Yes    Kerin Salen 11/19/2017, 11:09 AMImportant Message  Patient Details  Name: [name] [name] MRN: [id number] Date of Birth: 1945-09-24   Medicare Important Message Given:  Yes    Kerin Salen 11/19/2017, 11:09 AM

## 2017-11-19 NOTE — Progress Notes (Signed)
Pt arrived to unit via [facility]. Pt has call bell within reach and verbalized understanding to call if needed. RN called Dr. Bertis Ruddy office to inform of pt transfer.

## 2017-11-19 NOTE — Plan of Care (Signed)
Patient in bed this morning propped to left side. Incontinent bowel/bladder; condom cath in place. Will continue to monitor.

## 2017-11-20 DIAGNOSIS — Z87828 Personal history of other (healed) physical injury and trauma: Secondary | ICD-10-CM

## 2017-11-20 DIAGNOSIS — D649 Anemia, unspecified: Secondary | ICD-10-CM

## 2017-11-20 DIAGNOSIS — I10 Essential (primary) hypertension: Secondary | ICD-10-CM

## 2017-11-20 LAB — CBC WITH DIFFERENTIAL/PLATELET
Abs Immature Granulocytes: 0.02 10*3/uL (ref 0.00–0.07)
BASOS ABS: 0 10*3/uL (ref 0.0–0.1)
Basophils Relative: 0 %
EOS ABS: 0.1 10*3/uL (ref 0.0–0.5)
Eosinophils Relative: 2 %
HEMATOCRIT: 30.4 % — AB (ref 39.0–52.0)
HEMOGLOBIN: 9.7 g/dL — AB (ref 13.0–17.0)
Immature Granulocytes: 0 %
LYMPHS ABS: 1.7 10*3/uL (ref 0.7–4.0)
LYMPHS PCT: 28 %
MCH: 30.2 pg (ref 26.0–34.0)
MCHC: 31.9 g/dL (ref 30.0–36.0)
MCV: 94.7 fL (ref 80.0–100.0)
Monocytes Absolute: 0.5 10*3/uL (ref 0.1–1.0)
Monocytes Relative: 9 %
NEUTROS PCT: 61 %
NRBC: 0 % (ref 0.0–0.2)
Neutro Abs: 3.5 10*3/uL (ref 1.7–7.7)
Platelets: 332 10*3/uL (ref 150–400)
RBC: 3.21 MIL/uL — ABNORMAL LOW (ref 4.22–5.81)
RDW: 14.3 % (ref 11.5–15.5)
WBC: 5.8 10*3/uL (ref 4.0–10.5)

## 2017-11-20 LAB — BASIC METABOLIC PANEL
Anion gap: 6 (ref 5–15)
BUN: 37 mg/dL — AB (ref 8–23)
CO2: 25 mmol/L (ref 22–32)
CREATININE: 0.96 mg/dL (ref 0.61–1.24)
Calcium: 8.7 mg/dL — ABNORMAL LOW (ref 8.9–10.3)
Chloride: 105 mmol/L (ref 98–111)
Glucose, Bld: 144 mg/dL — ABNORMAL HIGH (ref 70–99)
Potassium: 5.4 mmol/L — ABNORMAL HIGH (ref 3.5–5.1)
Sodium: 136 mmol/L (ref 135–145)

## 2017-11-20 LAB — GLUCOSE, CAPILLARY
GLUCOSE-CAPILLARY: 124 mg/dL — AB (ref 70–99)
GLUCOSE-CAPILLARY: 149 mg/dL — AB (ref 70–99)
GLUCOSE-CAPILLARY: 150 mg/dL — AB (ref 70–99)
GLUCOSE-CAPILLARY: 204 mg/dL — AB (ref 70–99)
Glucose-Capillary: 142 mg/dL — ABNORMAL HIGH (ref 70–99)
Glucose-Capillary: 151 mg/dL — ABNORMAL HIGH (ref 70–99)

## 2017-11-20 MED ORDER — PNEUMOCOCCAL VAC POLYVALENT 25 MCG/0.5ML IJ INJ
0.5000 mL | INJECTION | INTRAMUSCULAR | Status: AC
  Administered 2017-11-23: 0.5 mL via INTRAMUSCULAR
  Filled 2017-11-20: qty 0.5

## 2017-11-20 MED ORDER — INFLUENZA VAC SPLIT HIGH-DOSE 0.5 ML IM SUSY
0.5000 mL | PREFILLED_SYRINGE | INTRAMUSCULAR | Status: DC
  Filled 2017-11-20: qty 0.5

## 2017-11-20 MED ORDER — SODIUM POLYSTYRENE SULFONATE 15 GM/60ML PO SUSP
15.0000 g | Freq: Once | ORAL | Status: AC
  Administered 2017-11-20: 15 g via ORAL
  Filled 2017-11-20: qty 60

## 2017-11-20 NOTE — Progress Notes (Signed)
Patient seen and examined at bedside this afternoon.Dressing removed and right hand examined.  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  Patient has a same issue on the left side but not as severe.  Patient would benefit from operative flexor tenotomy versus lengthening of both the wrist and digital flexors this Friday.  Patient will need significant wound care afterwards and may still require debridement versus amputation at one point in the future.  Have discussed in great detail with both the patient and his daughter his current predicament and treatment options.  They understand the risks and benefits and wished to proceed on Friday for flexor tenotomy versus lengthening of digital wrist flexors with wound debridement as necessary.

## 2017-11-20 NOTE — Plan of Care (Signed)
  Problem: Pain Managment: Goal: General experience of comfort will improve Outcome: Progressing   Problem: Safety: Goal: Ability to remain free from injury will improve Outcome: Progressing   

## 2017-11-20 NOTE — Plan of Care (Signed)
  Problem: Health Behavior/Discharge Planning: Goal: Ability to manage health-related needs will improve Outcome: Progressing   

## 2017-11-20 NOTE — Progress Notes (Signed)
PROGRESS NOTE    [name] [name]  NUU:[id number] DOB: 10/27/1945 DOA: 11/16/2017 PCP: Beckie Salts, MD    Brief Narrative: 72 year old male with a history of quadriparesis with a history of contractures, poor compliance, diabetes mellitus type 2, hyperlipidemia, hypertension, iron deficiency, depression, decubitus ulcers presenting with increasing edema and drainage from the right upper extremity.  The patient is a poor historian.  Most of his history is obtained from review the medical record.  The patient usually goes to [facility] to get his wound care, but has not been to the wound care center since 10/19.  At the time of admission, the patient has been afebrile and hemodynamically stable.  He denies any recent injuries or trauma to the hand.    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  Dr. Burney Gauze with orthopedics recommended transfer to [facility] for further evaluation and for possible surgery.  Patient is currently on IV vancomycin and continue the same at this point. Wound consult/care recommendations to start hydrotherapy and physical therapy recommends that patient is not a candidate for hydrotherapy. Patient currently denies any pain he is afebrile and does not have any elevated WBC count.    Hypertension Well controlled resume home medications.   Type 2 diabetes mellitus CBG (last 3)  Recent Labs    11/20/17 0516 11/20/17 0812 11/20/17 1135  GLUCAP 142* 151* 124*   Resume sliding scale insulin   Anemia of chronic disease  continue to monitor and transfuse to keep hemoglobin greater than 7.   Multiple lower extremity wounds  wound care consulted and recommendations given. Scattered scabbed lesions on the lower extremities which are dry  DVT prophylaxis: Lovenox Code Status: Full code Family Communication: None at bedside Disposition Plan: Pending Dr. Burney Gauze recommendations  Consultants:   Dr. Burney Gauze  Procedures: None Antimicrobials:  Subjective: Vision currently denies any pain in the hands or legs, denies any nausea vomiting or chest pain.  Objective: Vitals:   11/19/17 1054 11/19/17 1352 11/19/17 1949 11/20/17 0500  BP:  116/75 131/76 124/70  Pulse: 91 94 99 90  Resp: 19  17 16   Temp: 98 F (36.7 C) 98.8 F (37.1 C) 97.6 F (36.4 C) 97.9 F (36.6 C)  TempSrc: Oral Oral Oral Oral  SpO2: 99% 100% 100% 97%  Weight:      Height:        Intake/Output Summary (Last 24 hours) at 11/20/2017 1145 Last data filed at 11/20/2017 0857 Gross per 24 hour  Intake 959.29 ml  Output 2275 ml  Net -1315.71 ml   Filed Weights   11/16/17 1526  Weight: 65.8 kg    Examination:  General exam: Appears calm and comfortable  Respiratory system: Clear to auscultation. Respiratory effort normal. Cardiovascular system: S1 & S2 heard, RRR. No JVDNo pedal edema. Gastrointestinal system: Abdomen is nondistended, soft and nontender. No organomegaly or masses felt. Normal bowel sounds heard. Central nervous system: Alert and oriented. No focal neurological deficits. Extremities: Severe right digital contractions and a right wrist contraction  with skin breakdown on the right right hand Skin: See above Psychiatry:  Mood & affect appropriate.     Data Reviewed: I have personally reviewed following labs and imaging studies  CBC: Recent Labs  Lab 11/16/17 1700 11/17/17 0500 11/20/17 0320  WBC 7.8 7.3 5.8  NEUTROABS 5.6  --  3.5  HGB 10.7* 10.0* 9.7*  HCT 34.5* 32.7* 30.4*  MCV 99.7 98.2 94.7  PLT  326 279 381   Basic Metabolic Panel: Recent Labs  Lab 11/16/17 1700 11/17/17 1347 11/20/17 0320  NA 140 140 136  K 4.2 4.4 5.4*  CL 107 105 105  CO2 26 25 25   GLUCOSE 120* 112* 144*  BUN 22 22 37*  CREATININE 0.96 0.66 0.96  CALCIUM 8.8* 8.7* 8.7*  MG  --  1.7  --   PHOS  --  2.8  --    GFR: Estimated Creatinine Clearance: 64.7 mL/min (by C-G formula based on SCr of 0.96 mg/dL). Liver Function Tests: Recent Labs  Lab 11/17/17 1347  AST 14*  ALT 9  ALKPHOS 75  BILITOT 0.5  PROT 7.8  ALBUMIN 3.2*   No results for input(s): LIPASE, AMYLASE in the last 168 hours. No results for input(s): AMMONIA in the last 168 hours. Coagulation Profile: No results for input(s): INR, PROTIME in the last 168 hours. Cardiac Enzymes: No results for input(s): CKTOTAL, CKMB, CKMBINDEX, TROPONINI in the last 168 hours. BNP (last 3 results) No results for input(s): PROBNP in the last 8760 hours. HbA1C: No results for input(s): HGBA1C in the last 72 hours. CBG: Recent Labs  Lab 11/19/17 2146 11/19/17 2359 11/20/17 0516 11/20/17 0812 11/20/17 1135  GLUCAP 197* 204* 142* 151* 124*   Lipid Profile: No results for input(s): CHOL, HDL, LDLCALC, TRIG, CHOLHDL, LDLDIRECT in the last 72 hours. Thyroid Function Tests: No results for input(s): TSH, T4TOTAL, FREET4, T3FREE, THYROIDAB in the last 72 hours. Anemia Panel: No results for input(s): VITAMINB12, FOLATE, FERRITIN, TIBC, IRON, RETICCTPCT in the last 72 hours. Sepsis Labs: No results for input(s): PROCALCITON, LATICACIDVEN in the last 168 hours.  Recent Results (from the past 240 hour(s))  MRSA PCR Screening     Status: None   Collection Time: 11/16/17 11:58 PM  Result Value Ref Range Status   MRSA by PCR NEGATIVE NEGATIVE Final    Comment:        The GeneXpert MRSA Assay (FDA approved for NASAL specimens only), is one component of a comprehensive MRSA colonization surveillance program. It is not intended to diagnose  MRSA infection nor to guide or monitor treatment for MRSA infections. Performed at [facility], [place] [address] [place], [place] [address]          Radiology Studies: No results found.      Scheduled Meds: . amLODipine  5 mg Oral Daily  . collagenase   Topical Daily  . enoxaparin (LOVENOX) injection  40 mg Subcutaneous Q24H  . [START ON 11/21/2017] Influenza vac split quadrivalent PF  0.5 mL Intramuscular Tomorrow-1000  . insulin aspart  0-9 Units Subcutaneous Q4H  . multivitamin with minerals  1 tablet Oral Daily  . nutrition supplement (JUVEN)  1 packet Oral BID BM  . [START ON 11/21/2017] pneumococcal 23 valent vaccine  0.5 mL Intramuscular Tomorrow-1000  . sodium chloride flush  3 mL Intravenous Q12H  . sodium polystyrene  15 g Oral Once   Continuous Infusions: . sodium chloride 250 mL (11/16/17 2224)  . vancomycin 166.7 mL/hr at 11/19/17  1811     LOS: 4 days    Time spent:  32 minutes   Hosie Poisson, MD [facility] Pager [phone]  If 7PM-7AM, please contact night-coverage [email or web address] Password [email or web address] 11/20/2017, 11:45 AM

## 2017-11-20 NOTE — Progress Notes (Signed)
CSW met with patient to discuss SNF options. Patient reported he knew he was in [facility] however believed it was the month of May. CSW corrected patient. During SNF consult patient continued to tell social worker to come closer to his bedside multiple times, CSW declined everytime as patient was answering questions and able to hear clearly with no need for CSW to be close to patient. CSW asked if patient had family to contact to discuss SNF and he reported his daughter.   CSW spoke with patient daughter who reports she is POA for patient. She also reports she herself is a home health aid and that she has been caring for patient since 2011.Daughter would like patient to go home. Daughter reports no equipment or any needs at this time. CSW gave contact information if need changes.   CSW signing off.   [name] [name], [name] [name]

## 2017-11-20 NOTE — Progress Notes (Signed)
Pt pinching this Probation officer and assigned CNA's breasts and attempted to place his hands between this writer's legs. Did step away and advice pt to stop. Pt continued and expressed romantic interest in this Probation officer. Did report to MD, SW, and charge nurse who immediately educated pt on inappropriate behavior. Will continue to reinforce and educate.

## 2017-11-21 ENCOUNTER — Other Ambulatory Visit: Payer: Self-pay | Admitting: Orthopedic Surgery

## 2017-11-21 LAB — GLUCOSE, CAPILLARY
GLUCOSE-CAPILLARY: 188 mg/dL — AB (ref 70–99)
GLUCOSE-CAPILLARY: 101 mg/dL — AB (ref 70–99)
GLUCOSE-CAPILLARY: 170 mg/dL — AB (ref 70–99)
Glucose-Capillary: 102 mg/dL — ABNORMAL HIGH (ref 70–99)
Glucose-Capillary: 105 mg/dL — ABNORMAL HIGH (ref 70–99)
Glucose-Capillary: 135 mg/dL — ABNORMAL HIGH (ref 70–99)
Glucose-Capillary: 147 mg/dL — ABNORMAL HIGH (ref 70–99)

## 2017-11-21 LAB — BASIC METABOLIC PANEL
Anion gap: 5 (ref 5–15)
BUN: 31 mg/dL — AB (ref 8–23)
CO2: 26 mmol/L (ref 22–32)
CREATININE: 0.84 mg/dL (ref 0.61–1.24)
Calcium: 8.5 mg/dL — ABNORMAL LOW (ref 8.9–10.3)
Chloride: 104 mmol/L (ref 98–111)
GFR calc non Af Amer: >60 mL/min (ref >60)
Glucose, Bld: 166 mg/dL — ABNORMAL HIGH (ref 70–99)
Potassium: 5.5 mmol/L — ABNORMAL HIGH (ref 3.5–5.1)
SODIUM: 135 mmol/L (ref 135–145)

## 2017-11-21 LAB — VANCOMYCIN, TROUGH: VANCOMYCIN TR: 21 ug/mL — AB (ref 15–20)

## 2017-11-21 MED ORDER — VANCOMYCIN HCL 10 G IV SOLR
1250.0000 mg | INTRAVENOUS | Status: DC
  Administered 2017-11-21: 1250 mg via INTRAVENOUS
  Filled 2017-11-21: qty 1250

## 2017-11-21 MED ORDER — VANCOMYCIN HCL IN DEXTROSE 1-5 GM/200ML-% IV SOLN
1000.0000 mg | INTRAVENOUS | Status: DC
  Administered 2017-11-22 – 2017-11-25 (×4): 1000 mg via INTRAVENOUS
  Filled 2017-11-21 (×4): qty 200

## 2017-11-21 NOTE — Progress Notes (Signed)
Pharmacy Antibiotic Note  [name] [name] is a 72 y.o. male admitted on 11/16/2017 with severe wrist and digital flexion contracture with wound issues on the right side - multiple wounds on hands and legs.  Pharmacy has been consulted for vancomycin dosing - day #6 (refused dose 11/3). SCr stable at 0.84.  Vancomycin trough 21 this evening (drawn appropriately)  Plan: Reduce Vancomycin to 1000 mg IV q24h Monitor clinical progress, c/s, renal function F/u de-escalation plan/LOT, vanc trough at new steady state Ortho plans flexor tenotomy versus lengthening of digital wrist flexorswith wound debridement as necessary   Height: 5' 7.5" (171.5 cm) Weight: 145 lb (65.8 kg) IBW/kg (Calculated) : 67.25  Temp (24hrs), Avg:98.1 F (36.7 C), Min:97.9 F (36.6 C), Max:98.2 F (36.8 C)  Recent Labs  Lab 11/16/17 1700 11/17/17 0500 11/17/17 1347 11/20/17 0320 11/21/17 1236 11/21/17 1554  WBC 7.8 7.3  --  5.8  --   --   CREATININE 0.96  --  0.66 0.96 0.84  --   VANCOTROUGH  --   --   --   --   --  21*    Estimated Creatinine Clearance: 74 mL/min (by C-G formula based on SCr of 0.84 mg/dL).    Allergies  Allergen Reactions  . Lisinopril Other (See Comments)    Hyperkalemia.    Antimicrobials this admission: 11/2 vancomycin >>  Dose adjustments this admission:   Microbiology results:   Thank you for allowing [place] to participate in this patients care.   Jens Som, PharmD Please utilize Amion (under [place]) for appropriate number for your unit pharmacist. 11/21/2017 8:17 PM

## 2017-11-21 NOTE — Care Management Note (Signed)
Case Management Note  Patient Details  Name: [name] [name] MRN: [id number] Date of Birth: 1945/09/19  Subjective/Objective:                 Surgery Friday 11/21/17 to release tendons in hands.    Action/Plan:  From home w daughter who is primary caregiver and POA. Spoke with her on the phone. She states she provides 24/7 care since 2011. She has hospital bed, WC, all DME needed. She manages his appointments. He takes [employer] for transportation. Discussed home support, she is agreeable to [facility] RN and would like to use [facility].  Needs HH RN orders with wound management instructions and referral made to [facility].   Expected Discharge Date:                  Expected Discharge Plan:  [place]  In-House Referral:     Discharge planning Services  CM Consult  Post Acute Care Choice:    Choice offered to:     DME Arranged:    DME Agency:     HH Arranged:    [place] Agency:     Status of Service:  In process, will continue to follow  If discussed at Long Length of Stay Meetings, dates discussed:    Additional Comments:  Carles Collet, RN 11/21/2017, 9:54 AM

## 2017-11-21 NOTE — Progress Notes (Signed)
Critical Vanc level, 21. Pharmacy notified.

## 2017-11-21 NOTE — Progress Notes (Signed)
Pharmacy Antibiotic Note  [name] [name] is a 72 y.o. male admitted on 11/16/2017 with severe wrist and digital flexion contracture with wound issues on the right side - multiple wounds on hands and legs.  Pharmacy has been consulted for vancomycin dosing - day #6 (refused dose 11/3). SCr stable at 0.84.  Plan: Vancomycin 1250 mg IV q24h Monitor clinical progress, c/s, renal function F/u de-escalation plan/LOT, vancomycin trough at 1630 Ortho plans flexor tenotomy versus lengthening of digital wrist flexorswith wound debridement as necessary   Height: 5' 7.5" (171.5 cm) Weight: 145 lb (65.8 kg) IBW/kg (Calculated) : 67.25  Temp (24hrs), Avg:98.2 F (36.8 C), Min:97.9 F (36.6 C), Max:98.4 F (36.9 C)  Recent Labs  Lab 11/16/17 1700 11/17/17 0500 11/17/17 1347 11/20/17 0320 11/21/17 1236  WBC 7.8 7.3  --  5.8  --   CREATININE 0.96  --  0.66 0.96 0.84    Estimated Creatinine Clearance: 74 mL/min (by C-G formula based on SCr of 0.84 mg/dL).    Allergies  Allergen Reactions  . Lisinopril Other (See Comments)    Hyperkalemia.    Antimicrobials this admission: 11/2 vancomycin >>  Dose adjustments this admission:   Microbiology results:  Elicia Lamp, PharmD, BCPS Clinical Pharmacist Clinical phone [phone] Please check AMION for all [place] contact numbers 11/21/2017 1:56 PM

## 2017-11-21 NOTE — Progress Notes (Signed)
PROGRESS NOTE    [name] [name]  KYH:[id number] DOB: September 21, 1945 DOA: 11/16/2017 PCP: Beckie Salts, MD    Brief Narrative: 72 year old male with a history of quadriparesis with a history of contractures, poor compliance, diabetes mellitus type 2, hyperlipidemia, hypertension, iron deficiency, depression, decubitus ulcers presenting with increasing edema and drainage from the right upper extremity.  The patient is a poor historian.  Most of his history is obtained from review the medical record.  The patient usually goes to [facility] to get his wound care, but has not been to the wound care center since 10/19.  At the time of admission, the patient has been afebrile and hemodynamically stable.  He denies any recent injuries or trauma to the hand.    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  Dr. Burney Gauze with orthopedics recommended transfer to [facility] for further evaluation and for possible surgery.  Patient is currently on IV vancomycin and continue the same.  Wound consult/care recommendations to start hydrotherapy and physical therapy recommends that patient is not a candidate for hydrotherapy. Patient currently denies any pain ,  Dr. Burney Gauze plans to proceed with flexor tenotomy versus lengthening of digital wrist flexors with wound debridement as necessary He remains afebrile and does not have any elevated WBC count.  Repeat CBC pending    Hypertension Well controlled resume home medications.  No change in medication   Type 2 diabetes  mellitus CBG (last 3)  Recent Labs    11/21/17 0138 11/21/17 0447 11/21/17 0735  GLUCAP 188* 102* 101*   Resume sliding scale insulin, no change in medications   Anemia of chronic disease continue to monitor and transfuse to keep hemoglobin greater than 7.   Multiple lower extremity wounds  wound care consulted and recommendations given. Scattered scabbed lesions on the lower extremities which are dry.   Hyperkalemia  1 dose of Kayexalate given,  mild repeat BMP today   DVT prophylaxis: Lovenox Code Status: Full code Family Communication: None at bedside Disposition Plan: Plan for flexor tenotomy versus lengthening of digital flexors Consultants:   Dr. Burney Gauze  Procedures: None Antimicrobials:  Subjective: Patient denies any chest pain or shortness of breath.  Objective: Vitals:   11/20/17 0500 11/20/17 1429 11/20/17 2052 11/21/17 0510  BP: 124/70 109/69 99/61 102/64  Pulse: 90 95 84 76  Resp: 16 18 15 15   Temp: 97.9 F (36.6 C) 98.4 F (36.9 C) 98.2 F (36.8 C) 97.9 F (36.6 C)  TempSrc: Oral Oral Oral Oral  SpO2: 97% 98% 97% 100%  Weight:      Height:        Intake/Output Summary (Last 24 hours) at 11/21/2017 1126 Last data filed at 11/21/2017 1517 Gross per 24 hour  Intake 2093.65 ml  Output 1551 ml  Net 542.65 ml   Filed Weights   11/16/17 1526  Weight: 65.8 kg    Examination:  General exam: Appears calm and comfortable no distress noted Respiratory system: Clear to auscultation. Respiratory effort normal.  No wheezing  or rhonchi Cardiovascular system: S1 & S2 heard, RRR. No JVD, No pedal edema. Gastrointestinal system: Abdomen is soft, nontender, nondistended with good bowel sounds Central nervous system: Alert and oriented.  Mostly nonfocal Extremities: Severe right digital contractions and a right wrist contraction with skin breakdown on the right right hand with pus drainage Skin: See above Psychiatry:  Mood & affect appropriate.      Data Reviewed: I have personally reviewed following labs and imaging studies  CBC: Recent Labs  Lab 11/16/17 1700 11/17/17 0500 11/20/17 0320  WBC 7.8 7.3 5.8  NEUTROABS 5.6  --  3.5  HGB 10.7* 10.0* 9.7*  HCT 34.5* 32.7* 30.4*  MCV 99.7 98.2 94.7  PLT 326 279 177   Basic Metabolic Panel: Recent Labs  Lab 11/16/17 1700 11/17/17 1347 11/20/17 0320  NA 140 140 136  K 4.2 4.4 5.4*  CL 107 105 105  CO2 26 25 25   GLUCOSE 120* 112* 144*  BUN 22 22 37*  CREATININE 0.96 0.66 0.96  CALCIUM 8.8* 8.7* 8.7*  MG  --  1.7  --   PHOS  --  2.8  --    GFR: Estimated Creatinine Clearance: 64.7 mL/min (by C-G formula based on SCr of 0.96 mg/dL). Liver Function Tests: Recent Labs  Lab 11/17/17 1347  AST 14*  ALT 9  ALKPHOS 75  BILITOT 0.5  PROT 7.8  ALBUMIN 3.2*   No results for input(s): LIPASE, AMYLASE in the last 168 hours. No results for input(s): AMMONIA in the last 168 hours. Coagulation Profile: No results for input(s): INR, PROTIME in the last 168 hours. Cardiac Enzymes: No results for input(s): CKTOTAL, CKMB, CKMBINDEX, TROPONINI in the last 168 hours. BNP (last 3 results) No results for input(s): PROBNP in the last 8760 hours. HbA1C: No results for input(s): HGBA1C in the last 72 hours. CBG: Recent Labs  Lab 11/20/17 1630 11/20/17 2002 11/21/17 0138 11/21/17 0447 11/21/17 0735  GLUCAP 150* 149* 188* 102* 101*   Lipid Profile: No results for input(s): CHOL, HDL, LDLCALC, TRIG, CHOLHDL, LDLDIRECT in the last 72 hours. Thyroid Function Tests: No results for input(s): TSH, T4TOTAL, FREET4, T3FREE, THYROIDAB in the last 72 hours. Anemia Panel: No results for input(s): VITAMINB12, FOLATE, FERRITIN, TIBC, IRON, RETICCTPCT in the last 72 hours. Sepsis Labs: No results for input(s): PROCALCITON, LATICACIDVEN in the last 168 hours.  Recent Results (from the past 240 hour(s))  MRSA PCR Screening     Status: None   Collection Time: 11/16/17 11:58 PM   Result Value Ref Range Status   MRSA by PCR NEGATIVE NEGATIVE Final    Comment:        The GeneXpert MRSA Assay (FDA approved for NASAL specimens only), is one component of a comprehensive MRSA colonization surveillance program. It is not intended to diagnose MRSA infection nor to guide or monitor treatment for MRSA infections. Performed at [facility], [place] [address] [place], [place] [address]          Radiology Studies: No results found.      Scheduled Meds: . amLODipine  5 mg Oral Daily  . collagenase   Topical Daily  . enoxaparin (LOVENOX) injection  40 mg Subcutaneous Q24H  . Influenza vac split quadrivalent PF  0.5 mL Intramuscular Tomorrow-1000  . insulin aspart  0-9 Units Subcutaneous Q4H  . multivitamin with minerals  1 tablet Oral Daily  . nutrition supplement (JUVEN)  1 packet Oral BID BM  . pneumococcal 23 valent vaccine  0.5 mL Intramuscular Tomorrow-1000  . sodium chloride flush  3 mL Intravenous Q12H   Continuous Infusions: . sodium chloride 250 mL (11/16/17 2224)  . vancomycin 1,250 mg (11/20/17 1700)     LOS: 5 days    Time spent:  28 minutes   Hosie Poisson, MD [facility] Pager [phone]  If 7PM-7AM, please contact night-coverage [email or web address] Password [email or web address] 11/21/2017, 11:26 AM

## 2017-11-21 NOTE — Plan of Care (Signed)
  Problem: Health Behavior/Discharge Planning: Goal: Ability to manage health-related needs will improve Outcome: Progressing   Problem: Clinical Measurements: Goal: Ability to maintain clinical measurements within normal limits will improve Outcome: Progressing Goal: Will remain free from infection Outcome: Progressing Goal: Diagnostic test results will improve Outcome: Progressing Goal: Respiratory complications will improve Outcome: Progressing Goal: Cardiovascular complication will be avoided Outcome: Progressing   Problem: Activity: Goal: Risk for activity intolerance will decrease Outcome: Progressing   Problem: Safety: Goal: Ability to remain free from injury will improve Outcome: Progressing   Problem: Pain Managment: Goal: General experience of comfort will improve Outcome: Progressing   Problem: Elimination: Goal: Will not experience complications related to bowel motility Outcome: Progressing Goal: Will not experience complications related to urinary retention Outcome: Progressing   Problem: Nutrition: Goal: Adequate nutrition will be maintained Outcome: Progressing

## 2017-11-21 NOTE — Plan of Care (Signed)
  Problem: Clinical Measurements: Goal: Ability to maintain clinical measurements within normal limits will improve Outcome: Progressing   Problem: Activity: Goal: Risk for activity intolerance will decrease Outcome: Progressing   Problem: Pain Managment: Goal: General experience of comfort will improve Outcome: Progressing   Problem: Safety: Goal: Ability to remain free from injury will improve Outcome: Progressing   

## 2017-11-22 ENCOUNTER — Inpatient Hospital Stay (HOSPITAL_COMMUNITY): Payer: Medicare Other | Admitting: Anesthesiology

## 2017-11-22 ENCOUNTER — Encounter (HOSPITAL_COMMUNITY): Payer: Self-pay | Admitting: *Deleted

## 2017-11-22 ENCOUNTER — Encounter (HOSPITAL_COMMUNITY)
Admission: EM | Disposition: A | Discharge status: Skilled Nursing Facility | Payer: Self-pay | Source: Home / Self Care | Attending: Internal Medicine

## 2017-11-22 LAB — POCT I-STAT 4, (NA,K, GLUC, HGB,HCT)
GLUCOSE: 96 mg/dL (ref 70–99)
HCT: 27 % — ABNORMAL LOW (ref 39.0–52.0)
Hemoglobin: 9.2 g/dL — ABNORMAL LOW (ref 13.0–17.0)
POTASSIUM: 6.1 mmol/L — AB (ref 3.5–5.1)
SODIUM: 136 mmol/L (ref 135–145)

## 2017-11-22 LAB — BASIC METABOLIC PANEL
Anion gap: 5 (ref 5–15)
BUN: 38 mg/dL — AB (ref 8–23)
CALCIUM: 8.8 mg/dL — AB (ref 8.9–10.3)
CO2: 24 mmol/L (ref 22–32)
Chloride: 106 mmol/L (ref 98–111)
Creatinine, Ser: 0.85 mg/dL (ref 0.61–1.24)
GFR calc Af Amer: >60 mL/min (ref >60)
GFR calc non Af Amer: >60 mL/min (ref >60)
Glucose, Bld: 99 mg/dL (ref 70–99)
Potassium: 6.4 mmol/L (ref 3.5–5.1)
Sodium: 135 mmol/L (ref 135–145)

## 2017-11-22 LAB — CBC
HEMATOCRIT: 28.3 % — AB (ref 39.0–52.0)
Hemoglobin: 9 g/dL — ABNORMAL LOW (ref 13.0–17.0)
MCH: 30.4 pg (ref 26.0–34.0)
MCHC: 31.8 g/dL (ref 30.0–36.0)
MCV: 95.6 fL (ref 80.0–100.0)
NRBC: 0 % (ref 0.0–0.2)
PLATELETS: 313 10*3/uL (ref 150–400)
RBC: 2.96 MIL/uL — ABNORMAL LOW (ref 4.22–5.81)
RDW: 14.7 % (ref 11.5–15.5)
WBC: 6.8 10*3/uL (ref 4.0–10.5)

## 2017-11-22 LAB — GLUCOSE, CAPILLARY
GLUCOSE-CAPILLARY: 102 mg/dL — AB (ref 70–99)
GLUCOSE-CAPILLARY: 110 mg/dL — AB (ref 70–99)
GLUCOSE-CAPILLARY: 112 mg/dL — AB (ref 70–99)
GLUCOSE-CAPILLARY: 126 mg/dL — AB (ref 70–99)
GLUCOSE-CAPILLARY: 126 mg/dL — AB (ref 70–99)
Glucose-Capillary: 97 mg/dL (ref 70–99)

## 2017-11-22 SURGERY — REPAIR, TENDON, FLEXOR
Anesthesia: General | Site: Hand | Laterality: Right

## 2017-11-22 MED ORDER — FENTANYL CITRATE (PF) 250 MCG/5ML IJ SOLN
INTRAMUSCULAR | Status: AC
  Filled 2017-11-22: qty 5

## 2017-11-22 MED ORDER — 0.9 % SODIUM CHLORIDE (POUR BTL) OPTIME
TOPICAL | Status: DC | PRN
  Administered 2017-11-22: 1000 mL

## 2017-11-22 MED ORDER — MIDAZOLAM HCL 2 MG/2ML IJ SOLN
INTRAMUSCULAR | Status: AC
  Filled 2017-11-22: qty 2

## 2017-11-22 MED ORDER — ROCURONIUM BROMIDE 50 MG/5ML IV SOSY
PREFILLED_SYRINGE | INTRAVENOUS | Status: AC
  Filled 2017-11-22: qty 5

## 2017-11-22 MED ORDER — LIDOCAINE 2% (20 MG/ML) 5 ML SYRINGE
INTRAMUSCULAR | Status: AC
  Filled 2017-11-22: qty 5

## 2017-11-22 MED ORDER — PROPOFOL 10 MG/ML IV BOLUS
INTRAVENOUS | Status: AC
  Filled 2017-11-22: qty 40

## 2017-11-22 MED ORDER — ONDANSETRON HCL 4 MG/2ML IJ SOLN
INTRAMUSCULAR | Status: AC
  Filled 2017-11-22: qty 2

## 2017-11-22 MED ORDER — LACTATED RINGERS IV SOLN
INTRAVENOUS | Status: DC
  Administered 2017-11-22 – 2017-11-24 (×2): via INTRAVENOUS

## 2017-11-22 MED ORDER — LIDOCAINE HCL 1 % IJ SOLN
INTRAMUSCULAR | Status: AC
  Filled 2017-11-22: qty 20

## 2017-11-22 MED ORDER — SODIUM POLYSTYRENE SULFONATE 15 GM/60ML PO SUSP
30.0000 g | Freq: Once | ORAL | Status: AC
  Administered 2017-11-22: 30 g via ORAL
  Filled 2017-11-22: qty 120

## 2017-11-22 MED ORDER — CHLORHEXIDINE GLUCONATE 4 % EX LIQD
60.0000 mL | Freq: Once | CUTANEOUS | Status: AC
  Administered 2017-11-22: 4 via TOPICAL
  Filled 2017-11-22: qty 60

## 2017-11-22 MED ORDER — DEXAMETHASONE SODIUM PHOSPHATE 10 MG/ML IJ SOLN
INTRAMUSCULAR | Status: AC
  Filled 2017-11-22: qty 1

## 2017-11-22 MED ORDER — CALCIUM GLUCONATE-NACL 1-0.675 GM/50ML-% IV SOLN
1.0000 g | Freq: Once | INTRAVENOUS | Status: AC
  Administered 2017-11-22: 1000 mg via INTRAVENOUS
  Filled 2017-11-22: qty 50

## 2017-11-22 NOTE — Interval H&P Note (Signed)
History and Physical Interval Note:  11/22/2017 11:20 AM  [name] [name]  has presented today for surgery, with the diagnosis of RIGHT HAND AND WRIST FLEXOR CONTRACTURE  The various methods of treatment have been discussed with the patient and family. After consideration of risks, benefits and other options for treatment, the patient has consented to  Procedure(s): RIGHT  WRIST AND HAND DIGITAL FLEXOR TENDON,TENOTOMY VERSES LENGTHENING (Right) as a surgical intervention .  The patient's history has been reviewed, patient examined, no change in status, stable for surgery.  I have reviewed the patient's chart and labs.  Questions were answered to the patient's satisfaction.     Schuyler Amor

## 2017-11-22 NOTE — [place] FL2 (Signed)
MEDICAID FL2 LEVEL OF CARE SCREENING TOOL     IDENTIFICATION  Patient Name: [name] [name] Birthdate: 11/04/1945 Sex: male Admission Date (Current Location): 11/16/2017  [facility] and [place] Number:  Herbalist and Address:  [facility] [place] [address] [place], [place] [address]      Provider Number: [id number]  Attending Physician Name and Address:  Hosie Poisson, MD  Relative Name and Phone Number:  [name] (daughter) [phone]    Current Level of Care: Hospital Recommended Level of Care: [place] Prior Approval Number:    Date Approved/Denied:   PASRR Number: [id number]  Discharge Plan: SNF    Current Diagnoses: Patient Active Problem List   Diagnosis Date Noted  . Malnutrition of moderate degree 11/18/2017  . History of open hand wound 11/16/2017  . Wounds, multiple 11/16/2017  . Multiple wounds 11/16/2017  . Paraplegia ([place]) 11/16/2017  . Wound infection 11/16/2017  . 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 ([place]) 11/16/2017  . Metabolic acidosis 82/42/3536  . Pressure injury of skin 09/25/2017  . Depression 04/08/2015  . Essential hypertension   . Diabetes mellitus without complication ([place])   . HLD (hyperlipidemia)   . Iron deficiency anemia   . Anemia 03/17/2013  . Hyperkalemia 02/27/2013  . 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 Pressure ulcer of left heel 02/27/2013  . Blister of foot 02/27/2013  . UTI (lower urinary tract infection) 02/21/2013  . Ulcer of right ankle ([place]) 02/21/2013    Orientation RESPIRATION BLADDER Height & Weight     Self, Situation, Place  Normal Incontinent, External catheter Weight: 145 lb (65.8 kg) Height:  5' 7.5" (171.5 cm)  BEHAVIORAL SYMPTOMS/MOOD NEUROLOGICAL BOWEL NUTRITION STATUS      Incontinent Diet(see discharge summary)  AMBULATORY STATUS COMMUNICATION OF NEEDS Skin   Extensive Assist Verbally PU Stage and Appropriate Care, Surgical wounds,  Skin abrasions(wound incision open/dehisced pretibial right and left, pressure injury coccyx, abrasion hand/leg, blister buttocks and leg, cracking on hand and leg. )                       Personal Care Assistance Level of Assistance  Bathing, Feeding, Dressing, Total care Bathing Assistance: Maximum assistance Feeding assistance: Limited assistance Dressing Assistance: Maximum assistance Total Care Assistance: Maximum assistance   Functional Limitations Info  Sight, Hearing, Speech Sight Info: Adequate Hearing Info: Adequate Speech Info: Adequate    SPECIAL CARE FACTORS FREQUENCY  PT (By licensed PT), OT (By licensed OT)     PT Frequency: min 5x weekly OT Frequency: min 3x weekly            Contractures Contractures Info: Not present    Additional Factors Info  Code Status, Allergies Code Status Info: full Allergies Info: Allergies:  Lisinopril           Current Medications (11/22/2017):  This is the current hospital active medication list Current Facility-Administered Medications  Medication Dose Route Frequency Provider Last Rate Last Dose  . 0.9 %  sodium chloride infusion  250 mL Intravenous PRN Toy Baker, MD 10 mL/hr at 11/16/17 2224 250 mL at 11/16/17 2224  . acetaminophen (TYLENOL) tablet 650 mg  650 mg Oral Q6H PRN Toy Baker, MD       Or  . acetaminophen (TYLENOL) suppository 650 mg  650 mg Rectal Q6H PRN Doutova, Anastassia, MD      . amLODipine (NORVASC) tablet 5 mg  5 mg Oral Daily Ezenduka,  Adline Peals, MD   5 mg at 11/22/17 1056  . collagenase (SANTYL) ointment   Topical Daily Alma Friendly, MD      . enoxaparin (LOVENOX) injection 40 mg  40 mg Subcutaneous Q24H Alma Friendly, MD   40 mg at 11/21/17 1718  . HYDROcodone-acetaminophen (NORCO/VICODIN) 5-325 MG per tablet 1-2 tablet  1-2 tablet Oral Q4H PRN Toy Baker, MD   2 tablet at 11/21/17 1054  . Influenza vac split quadrivalent PF (FLUZONE HIGH-DOSE)  injection 0.5 mL  0.5 mL Intramuscular Tomorrow-1000 Hosie Poisson, MD      . insulin aspart (novoLOG) injection 0-9 Units  0-9 Units Subcutaneous Q4H Toy Baker, MD   1 Units at 11/22/17 0455  . multivitamin with minerals tablet 1 tablet  1 tablet Oral Daily Alma Friendly, MD   1 tablet at 11/22/17 1056  . nutrition supplement (JUVEN) (JUVEN) powder packet 1 packet  1 packet Oral BID BM Alma Friendly, MD   1 packet at 11/21/17 1357  . ondansetron (ZOFRAN) tablet 4 mg  4 mg Oral Q6H PRN Toy Baker, MD       Or  . ondansetron (ZOFRAN) injection 4 mg  4 mg Intravenous Q6H PRN Doutova, Anastassia, MD      . pneumococcal 23 valent vaccine (PNU-IMMUNE) injection 0.5 mL  0.5 mL Intramuscular Tomorrow-1000 Hosie Poisson, MD      . sodium chloride flush (NS) 0.9 % injection 10-40 mL  10-40 mL Intracatheter PRN Alma Friendly, MD   10 mL at 11/22/17 1048  . sodium chloride flush (NS) 0.9 % injection 3 mL  3 mL Intravenous Q12H Doutova, Anastassia, MD   3 mL at 11/21/17 2040  . sodium chloride flush (NS) 0.9 % injection 3 mL  3 mL Intravenous PRN Doutova, Anastassia, MD      . vancomycin (VANCOCIN) IVPB 1000 mg/200 mL premix  1,000 mg Intravenous Q24H Hosie Poisson, MD         Discharge Medications: Please see discharge summary for a list of discharge medications.  Relevant Imaging Results:  Relevant Lab Results:   Additional Information SSN: [id number]  Alberteen Sam, LCSW

## 2017-11-22 NOTE — Clinical Social Work Note (Signed)
Clinical Social Work Assessment  Patient Details  Name: [name] [name] MRN: [id number] Date of Birth: 1945/01/26  Date of referral:  11/22/17               Reason for consult:  Discharge Planning                Permission sought to share information with:  Case Manager, Facility Sport and exercise psychologist, Family Supports Permission granted to share information::  Yes, Verbal Permission Granted  Name::     Engineer, maintenance (IT)::  SNFs  Relationship::  daughter  Contact Information:  [phone]  Housing/Transportation Living arrangements for the past 2 months:  [place] of Information:  Patient Patient Interpreter Needed:  None Criminal Activity/Legal Involvement Pertinent to Current Situation/Hospitalization:  No - Comment as needed Significant Relationships:  Adult Children Lives with:  Adult Children Do you feel safe going back to the place where you live?  No Need for family participation in patient care:  Yes (Comment)  Care giving concerns:  CSW received referral for possible SNF placement at time of discharge. Spoke with patient regarding possibility of SNF placement . Patient's  Daughter [name] [name]  is currently unable to care for him at their home given patient's current needs and fall risk.  Patient and daughter expressed understanding of PT recommendation and are agreeable to SNF placement at time of discharge. CSW to continue to follow and assist with discharge planning needs.     Social Worker assessment / plan:  Spoke with patient and daughter concerning possibility of rehab at [facility] before returning home.    Employment status:  Retired Forensic scientist:  Medicare PT Recommendations:  [place] / Referral to community resources:  [place]  Patient/Family's Response to care:  Patient and  Daughter recognize need for rehab before returning home and are agreeable to a SNF in [place]. They report preference for   [facility]. CSW explained insurance authorization process. Patient's family reported that they want patient to get stronger to be able to come back home.    Patient/Family's Understanding of and Emotional Response to Diagnosis, Current Treatment, and Prognosis:  Patient/family is realistic regarding therapy needs and expressed being hopeful for SNF placement. Patient expressed understanding of CSW role and discharge process as well as medical condition. No questions/concerns about plan or treatment.    Emotional Assessment Appearance:  Appears stated age Attitude/Demeanor/Rapport:  Lethargic, Engaged Affect (typically observed):  Flat Orientation:  Oriented to Self, Oriented to Situation, Oriented to Place Alcohol / Substance use:  Not Applicable Psych involvement (Current and /or in the community):  No (Comment)  Discharge Needs  Concerns to be addressed:  Discharge Planning Concerns Readmission within the last 30 days:  No Current discharge risk:  Dependent with Mobility Barriers to Discharge:  Continued Medical Work up   [employer], LCSW 11/22/2017, 11:22 AM

## 2017-11-22 NOTE — Progress Notes (Signed)
Critical Lab value Potassium 6.4. Patient is in OR. Called OR short stay and informed them. MD made aware via Page text. MD asked writer to call lab and ask if sample was hemolyzed. Lab was called and stated that sample is not hemolyzed. New orders received and will be carried through when patient returns to unit.

## 2017-11-22 NOTE — Progress Notes (Signed)
PROGRESS NOTE    [name] [name]  IRJ:[id number] DOB: May 09, 1945 DOA: 11/16/2017 PCP: Beckie Salts, MD    Brief Narrative: 72 year old male with a history of quadriparesis with a history of contractures, poor compliance, diabetes mellitus type 2, hyperlipidemia, hypertension, iron deficiency, depression, decubitus ulcers presenting with increasing edema and drainage from the right upper extremity.  The patient is a poor historian.  Most of his history is obtained from review the medical record.  The patient usually goes to [facility] to get his wound care, but has not been to the wound care center since 10/19.  At the time of admission, the patient has been afebrile and hemodynamically stable.  He denies any recent injuries or trauma to the hand.    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  Dr. Burney Gauze with orthopedics recommended transfer to [facility] for further evaluation and for possible surgery.  Patient is currently on IV vancomycin and continue the same.  Wound consult/care recommendations to start hydrotherapy and physical therapy recommends that patient is not a candidate for hydrotherapy at this time Patient currently denies any pain ,  Dr. Burney Gauze plans to proceed with flexor tenotomy versus lengthening of digital wrist flexors with wound debridement as necessary today He remains afebrile and does not have any elevated WBC count.      Hypertension Well controlled resume home medications.  No change in medication   Type 2 diabetes  mellitus CBG (last 3)  Recent Labs    11/21/17 2337 11/22/17 0452 11/22/17 0740  GLUCAP 135* 126* 102*   Resume sliding scale insulin, no change in medications   Anemia of chronic disease Continue to monitor and transfuse to keep hemoglobin greater than 7. Hemoglobin stable at 9. No signs of overt bleeding.   Multiple lower extremity wounds  wound care consulted and recommendations given. Scattered scabbed lesions on the lower extremities which are dry.  Will need follow-up with wound care center on discharge.  Hyperkalemia  1 dose of Kayexalate given,  Patient has persistently high potassium and another dose of Kayexalate ordered.   DVT prophylaxis: Lovenox Code Status: Full code Family Communication: Discussed with daughter at bedside who is open to SNF on discharge. Disposition Plan: Plan for flexor tenotomy versus lengthening of digital flexors   Consultants:   Dr. Burney Gauze  Procedures: None Antimicrobials: IV vancomycin Subjective: Patient appears comfortable his right hand is bandaged with pus soaking the bandage.  Patient denies any chest pain or shortness of breath, nausea or vomiting  Objective: Vitals:   11/20/17 2052 11/21/17 0510 11/21/17 2033 11/22/17 0453  BP: 99/61 102/64 111/71 106/65  Pulse: 84 76 100 94  Resp: 15 15 15 15   Temp: 98.2 F (36.8 C) 97.9 F (36.6 C) 97.9 F (36.6 C) 98.4 F (36.9 C)  TempSrc: Oral Oral Oral Oral  SpO2: 97% 100% 100% 100%  Weight:      Height:        Intake/Output Summary (Last 24 hours) at 11/22/2017 1114 Last data filed  at 11/22/2017 0454 Gross per 24 hour  Intake 1060 ml  Output 2850 ml  Net -1790 ml   Filed Weights   11/16/17 1526  Weight: 65.8 kg    Examination:  General exam: Appears to be in good spirits Respiratory system: Lateral air entry fair, no wheezing or rhonchi Cardiovascular system: S1 & S2 heard, RRR. No JVD, No pedal edema. Gastrointestinal system: Abdomen is soft, nontender,  with good bowel sounds. Central nervous system: Alert and oriented.  Mostly nonfocal Extremities: Severe right digital contractions and a right wrist contraction with skin breakdown on the right right hand with pus drainage, no changes. Skin: See above Psychiatry:  Mood & affect appropriate.     Data Reviewed: I have personally reviewed following labs and imaging studies  CBC: Recent Labs  Lab 11/16/17 1700 11/17/17 0500 11/20/17 0320 11/22/17 0136  WBC 7.8 7.3 5.8 6.8  NEUTROABS 5.6  --  3.5  --   HGB 10.7* 10.0* 9.7* 9.0*  HCT 34.5* 32.7* 30.4* 28.3*  MCV 99.7 98.2 94.7 95.6  PLT 326 279 332 341   Basic Metabolic Panel: Recent Labs  Lab 11/16/17 1700 11/17/17 1347 11/20/17 0320 11/21/17 1236  NA 140 140 136 135  K 4.2 4.4 5.4* 5.5*  CL 107 105 105 104  CO2 26 25 25 26   GLUCOSE 120* 112* 144* 166*  BUN 22 22 37* 31*  CREATININE 0.96 0.66 0.96 0.84  CALCIUM 8.8* 8.7* 8.7* 8.5*  MG  --  1.7  --   --   PHOS  --  2.8  --   --    GFR: Estimated Creatinine Clearance: 74 mL/min (by C-G formula based on SCr of 0.84 mg/dL). Liver Function Tests: Recent Labs  Lab 11/17/17 1347  AST 14*  ALT 9  ALKPHOS 75  BILITOT 0.5  PROT 7.8  ALBUMIN 3.2*   No results for input(s): LIPASE, AMYLASE in the last 168 hours. No results for input(s): AMMONIA in the last 168 hours. Coagulation Profile: No results for input(s): INR, PROTIME in the last 168 hours. Cardiac Enzymes: No results for input(s): CKTOTAL, CKMB, CKMBINDEX, TROPONINI in the last 168 hours. BNP (last 3 results) No results for input(s): PROBNP in the last 8760 hours. HbA1C: No results for input(s): HGBA1C in the last 72 hours. CBG: Recent Labs  Lab 11/21/17 1625 11/21/17 2035 11/21/17 2337 11/22/17 0452 11/22/17 0740  GLUCAP 105* 147* 135* 126* 102*   Lipid Profile: No results for input(s): CHOL, HDL, LDLCALC, TRIG, CHOLHDL, LDLDIRECT in the last 72 hours. Thyroid Function Tests: No results for  input(s): TSH, T4TOTAL, FREET4, T3FREE, THYROIDAB in the last 72 hours. Anemia Panel: No results for input(s): VITAMINB12, FOLATE, FERRITIN, TIBC, IRON, RETICCTPCT in the last 72 hours. Sepsis Labs: No results for input(s): PROCALCITON, LATICACIDVEN in the last 168 hours.  Recent Results (from the past 240 hour(s))  MRSA PCR Screening     Status: None   Collection Time: 11/16/17 11:58 PM  Result Value Ref Range Status   MRSA by PCR NEGATIVE NEGATIVE Final    Comment:        The GeneXpert MRSA Assay (FDA approved for NASAL specimens only), is one component of a comprehensive MRSA colonization surveillance program. It is not intended to diagnose MRSA infection nor to guide or monitor treatment for MRSA infections. Performed at [facility], [place] [address] [place], [place] [address]          Radiology Studies: No results found.  Scheduled Meds: . amLODipine  5 mg Oral Daily  . collagenase   Topical Daily  . enoxaparin (LOVENOX) injection  40 mg Subcutaneous Q24H  . Influenza vac split quadrivalent PF  0.5 mL Intramuscular Tomorrow-1000  . insulin aspart  0-9 Units Subcutaneous Q4H  . multivitamin with minerals  1 tablet Oral Daily  . nutrition supplement (JUVEN)  1 packet Oral BID BM  . pneumococcal 23 valent vaccine  0.5 mL Intramuscular Tomorrow-1000  . sodium chloride flush  3 mL Intravenous Q12H   Continuous Infusions: . sodium chloride 250 mL (11/16/17 2224)  . vancomycin       LOS: 6 days    Time spent: 25 minutes   Hosie Poisson, MD [facility] Pager [phone]  If 7PM-7AM, please contact night-coverage [email or web address] Password [email or web address] 11/22/2017, 11:14 AM

## 2017-11-22 NOTE — Plan of Care (Signed)
  Problem: Health Behavior/Discharge Planning: Goal: Ability to manage health-related needs will improve Outcome: Progressing   Problem: Activity: Goal: Risk for activity intolerance will decrease Outcome: Progressing   Problem: Pain Managment: Goal: General experience of comfort will improve Outcome: Progressing   Problem: Safety: Goal: Ability to remain free from injury will improve Outcome: Progressing   Problem: Skin Integrity: Goal: Risk for impaired skin integrity will decrease Outcome: Progressing

## 2017-11-22 NOTE — Interval H&P Note (Signed)
History and Physical Interval Note:  11/22/2017 11:21 AM  [name] [name]  has presented today for surgery, with the diagnosis of RIGHT HAND AND WRIST FLEXOR CONTRACTURE  The various methods of treatment have been discussed with the patient and family. After consideration of risks, benefits and other options for treatment, the patient has consented to  Procedure(s): RIGHT  WRIST AND HAND DIGITAL FLEXOR TENDON,TENOTOMY VERSES LENGTHENING (Right) as a surgical intervention .  The patient's history has been reviewed, patient examined, no change in status, stable for surgery.  I have reviewed the patient's chart and labs.  Questions were answered to the patient's satisfaction.     Schuyler Amor

## 2017-11-23 LAB — BASIC METABOLIC PANEL
Anion gap: 3 — ABNORMAL LOW (ref 5–15)
BUN: 31 mg/dL — AB (ref 8–23)
CHLORIDE: 105 mmol/L (ref 98–111)
CO2: 28 mmol/L (ref 22–32)
CREATININE: 0.76 mg/dL (ref 0.61–1.24)
Calcium: 9 mg/dL (ref 8.9–10.3)
Glucose, Bld: 107 mg/dL — ABNORMAL HIGH (ref 70–99)
POTASSIUM: 5.1 mmol/L (ref 3.5–5.1)
SODIUM: 136 mmol/L (ref 135–145)

## 2017-11-23 LAB — GLUCOSE, CAPILLARY
GLUCOSE-CAPILLARY: 104 mg/dL — AB (ref 70–99)
GLUCOSE-CAPILLARY: 107 mg/dL — AB (ref 70–99)
Glucose-Capillary: 95 mg/dL (ref 70–99)
Glucose-Capillary: 151 mg/dL — ABNORMAL HIGH (ref 70–99)
Glucose-Capillary: 143 mg/dL — ABNORMAL HIGH (ref 70–99)

## 2017-11-23 NOTE — Progress Notes (Signed)
PROGRESS NOTE    [name] [name]  OXB:[id number] DOB: Jan 06, 1946 DOA: 11/16/2017 PCP: Beckie Salts, MD    Brief Narrative: 72 year old male with a history of quadriparesis with a history of contractures, poor compliance, diabetes mellitus type 2, hyperlipidemia, hypertension, iron deficiency, depression, decubitus ulcers presenting with increasing edema and drainage from the right upper extremity.  The patient is a poor historian.  Most of his history is obtained from review the medical record.  The patient usually goes to [facility] to get his wound care, but has not been to the wound care center since 10/19.  At the time of admission, the patient has been afebrile and hemodynamically stable.  He denies any recent injuries or trauma to the hand.    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  Dr. Burney Gauze with orthopedics recommended transfer to [facility] for further evaluation and for possible surgery.  Patient is currently on IV vancomycin and continue the same.  Wound consult/care recommendations to start hydrotherapy and physical therapy recommends that patient is not a candidate for hydrotherapy at this time Dr. Burney Gauze plans to proceed with flexor tenotomy versus lengthening of digital wrist flexors with wound debridement as necessary next Wednesday as the procedure got cancelled due to hyperkalemia.  He remains afebrile and does not have any elevated WBC count.      Hypertension Well controlled resume home medications.  No change in  medication   Type 2 diabetes mellitus CBG (last 3)  Recent Labs    11/23/17 0726 11/23/17 1241 11/23/17 1630  GLUCAP 104* 107* 151*   Resume sliding scale insulin, no change in medications   Anemia of chronic disease Continue to monitor and transfuse to keep hemoglobin greater than 7. Hemoglobin stable at 9. No signs of overt bleeding.   Multiple lower extremity wounds  wound care consulted and recommendations given. Scattered scabbed lesions on the lower extremities which are dry.  Will need follow-up with wound care center on discharge.  Hyperkalemia  1 dose of Kayexalate given,  Patient has persistently high potassium and another dose of Kayexalate ordered.  1 dose of calcium gluconate also given repeat potassium is at 5.1.    DVT prophylaxis: Lovenox Code Status: Full code Family Communication: Discussed with daughter  who is open to SNF on discharge. Disposition Plan: SNF when bed available   Consultants:   Dr. Burney Gauze  Procedures: None Antimicrobials: IV vancomycin Subjective: Patient denies any chest pain or shortness of breath at this time he wants to get the procedure done and be discharged to SNF no family at bedside Objective: Vitals:   11/22/17 2144 11/23/17 0415 11/23/17 0912 11/23/17 1014  BP: 99/66 105/68 (!) 150/92 126/79  Pulse: 90 92 94 92  Resp:   (!) 22 16  Temp: 98.7 F (37.1 C) 97.9 F (36.6 C) 98.1 F (36.7 C) 98.3 F (36.8 C)  TempSrc: Oral Oral Oral Oral  SpO2: 100% 100% 100% 100%  Weight:      Height:  Intake/Output Summary (Last 24 hours) at 11/23/2017 1749 Last data filed at 11/23/2017 1520 Gross per 24 hour  Intake 721.67 ml  Output 1000 ml  Net -278.33 ml   Filed Weights   11/16/17 1526 11/22/17 1240  Weight: 65.8 kg 65.8 kg    Examination:  General exam: Comfortable and in good spirits Respiratory system: Bilateral air entry good no wheezing or rhonchi Cardiovascular system: S1 & S2 heard, RRR. No JVD, No  pedal edema. Gastrointestinal system: Abdomen is soft, nontender nondistended with good bowel sounds Central nervous system: Alert and oriented.  Mostly nonfocal Extremities: Severe right digital contractions and a right wrist contraction with skin breakdown on the right right hand with pus drainage, no changes.  Multiple wounds on the lower extremities bandaged. Skin: See above Psychiatry:  Mood & affect appropriate.     Data Reviewed: I have personally reviewed following labs and imaging studies  CBC: Recent Labs  Lab 11/17/17 0500 11/20/17 0320 11/22/17 0136 11/22/17 1333  WBC 7.3 5.8 6.8  --   NEUTROABS  --  3.5  --   --   HGB 10.0* 9.7* 9.0* 9.2*  HCT 32.7* 30.4* 28.3* 27.0*  MCV 98.2 94.7 95.6  --   PLT 279 332 313  --    Basic Metabolic Panel: Recent Labs  Lab 11/17/17 1347 11/20/17 0320 11/21/17 1236 11/22/17 1203 11/22/17 1333 11/23/17 0729  NA 140 136 135 135 136 136  K 4.4 5.4* 5.5* 6.4* 6.1* 5.1  CL 105 105 104 106  --  105  CO2 25 25 26 24   --  28  GLUCOSE 112* 144* 166* 99 96 107*  BUN 22 37* 31* 38*  --  31*  CREATININE 0.66 0.96 0.84 0.85  --  0.76  CALCIUM 8.7* 8.7* 8.5* 8.8*  --  9.0  MG 1.7  --   --   --   --   --   PHOS 2.8  --   --   --   --   --    GFR: Estimated Creatinine Clearance: 77.7 mL/min (by C-G formula based on SCr of 0.76 mg/dL). Liver Function Tests: Recent Labs  Lab 11/17/17 1347  AST 14*  ALT 9  ALKPHOS 75  BILITOT 0.5  PROT 7.8  ALBUMIN 3.2*   No results for input(s): LIPASE, AMYLASE in the last 168 hours. No results for input(s): AMMONIA in the last 168 hours. Coagulation Profile: No results for input(s): INR, PROTIME in the last 168 hours. Cardiac Enzymes: No results for input(s): CKTOTAL, CKMB, CKMBINDEX, TROPONINI in the last 168 hours. BNP (last 3 results) No results for input(s): PROBNP in the last 8760 hours. HbA1C: No results for input(s): HGBA1C in the last 72 hours. CBG: Recent Labs  Lab  11/22/17 2355 11/23/17 0418 11/23/17 0726 11/23/17 1241 11/23/17 1630  GLUCAP 112* 95 104* 107* 151*   Lipid Profile: No results for input(s): CHOL, HDL, LDLCALC, TRIG, CHOLHDL, LDLDIRECT in the last 72 hours. Thyroid Function Tests: No results for input(s): TSH, T4TOTAL, FREET4, T3FREE, THYROIDAB in the last 72 hours. Anemia Panel: No results for input(s): VITAMINB12, FOLATE, FERRITIN, TIBC, IRON, RETICCTPCT in the last 72 hours. Sepsis Labs: No results for input(s): PROCALCITON, LATICACIDVEN in the last 168 hours.  Recent Results (from the past 240 hour(s))  MRSA PCR Screening     Status: None   Collection Time: 11/16/17 11:58 PM  Result Value Ref Range Status   MRSA by PCR NEGATIVE NEGATIVE Final  Comment:        The GeneXpert MRSA Assay (FDA approved for NASAL specimens only), is one component of a comprehensive MRSA colonization surveillance program. It is not intended to diagnose MRSA infection nor to guide or monitor treatment for MRSA infections. Performed at [facility], [place] [address], [place], [place] [address]          Radiology Studies: No results found.      Scheduled Meds: . amLODipine  5 mg Oral Daily  . collagenase   Topical Daily  . enoxaparin (LOVENOX) injection  40 mg Subcutaneous Q24H  . Influenza vac split quadrivalent PF  0.5 mL Intramuscular Tomorrow-1000  . insulin aspart  0-9 Units Subcutaneous Q4H  . multivitamin with minerals  1 tablet Oral Daily  . nutrition supplement (JUVEN)  1 packet Oral BID BM  . sodium chloride flush  3 mL Intravenous Q12H   Continuous Infusions: . sodium chloride 250 mL (11/16/17 2224)  . lactated ringers 10 mL/hr at 11/22/17 1250  . vancomycin 1,000 mg (11/22/17 2150)     LOS: 7 days    Time spent: 25 minutes   Hosie Poisson, MD [facility] Pager [phone]  If 7PM-7AM, please contact night-coverage [email or web address] Password [facility] 11/23/2017, 5:49 PM

## 2017-11-23 NOTE — Plan of Care (Signed)

## 2017-11-23 NOTE — Progress Notes (Signed)
Patient surgery yesterday canceled due to hyperkalemia.  Have posted patient for this Wednesday at 1234 procedure if hyperkalemia has been addressed.  If there are no other acute medical issues patient could be discharged with outpatient follow-up and surgery on this Wednesday.  Will follow with medicine service with regards to hyperkalemia.

## 2017-11-23 NOTE — Progress Notes (Signed)
Occupational Therapy Treatment Patient Details Name: Codie Krogh MRN: [id number] DOB: 1945/01/26 Today's Date: 11/23/2017    History of present illness 72 year old male with a history of quadriparesis with a history of contractures, poor compliance, diabetes mellitus type 2, hyperlipidemia, hypertension, iron deficiency, depression, decubitus ulcers presenting with increasing edema and drainage from the right upper extremity.  The patient is a poor historian.  Most of his history is obtained from review the medical record.  The patient usually goes to [facility] to get his wound care, but has not been to the wound care center since 10/19.  At the time of admission, the patient has been afebrile and hemodynamically stable.  He denies any recent injuries or trauma to the hand.  He does state that there is been some increasing edema and drainage   OT comments  Pt. Seen for skilled OT session.  Focus of session to reinforce adaptive feeding strategies introduced at previous session.  Pt. Resistant to this and wanted to be fed and provided beverages.  Provided edema reduction positioning for RUE. Pt. Only tolerating neck pillow under RUE for elevation and would not allow HOB elevated.    Follow Up Recommendations  SNF;Home health OT;Supervision/Assistance - 24 hour    Equipment Recommendations  None recommended by OT    Recommendations for Other Services      Precautions / Restrictions Precautions Precautions: Fall Precaution Comments: L UE contractures and wounds       Mobility Bed Mobility                  Transfers                      Balance                                           ADL either performed or assessed with clinical judgement   ADL   Eating/Feeding: Bed level;Moderate assistance Eating/Feeding Details (indicate cue type and reason): HOB raise, attempted for pt. to engage with holding cup/bottle for taking a sip of water. pt.  refusing and yelling "i cant do that now come on and just do it for me".  Grooming: Wash/dry face;Cueing for compensatory techniques Grooming Details (indicate cue type and reason): reviewed compensatory tech. to use L hand to wash face with washcloth. pt. declined return demo but verbalized understanding                               General ADL Comments: HOB elevated and pt. was in good position in bed, at end of session insisted on hob being lowered to more supine position. educated on benefits of being upright. he insisted on [facility] being lowered.       Vision       Perception     Praxis      Cognition Arousal/Alertness: Awake/alert Behavior During Therapy: Agitated Overall Cognitive Status: Within Functional Limits for tasks assessed                                 General Comments: would fluctuate between pleasantries then become very agitated and then back to pleasant again. (mostly when encouraging him to do something he did not want to do)  Exercises Other Exercises Other Exercises: educated on edema management for RUE. pt. adamanent NO PILLOWS "i dont want that big ole thing, come on not that is too much". has a neck pillow he uses to wrap around RUE just above the elbow which does provide some support and keep RUE upright   Shoulder Instructions       General Comments      Pertinent Vitals/ Pain       Pain Assessment: No/denies pain  Home Living                                          Prior Functioning/Environment              Frequency  Min 2X/week        Progress Toward Goals  OT Goals(current goals can now be found in the care plan section)  Progress towards OT goals: Progressing toward goals     Plan      Co-evaluation                 AM-PAC PT "6 Clicks" Daily Activity     Outcome Measure   Help from another person eating meals?: A Lot Help from another person taking care of  personal grooming?: Total Help from another person toileting, which includes using toliet, bedpan, or urinal?: Total Help from another person bathing (including washing, rinsing, drying)?: Total Help from another person to put on and taking off regular upper body clothing?: Total Help from another person to put on and taking off regular lower body clothing?: Total 6 Click Score: 7    End of Session    OT Visit Diagnosis: Muscle weakness (generalized) (M62.81)   Activity Tolerance Treatment limited secondary to agitation   Patient Left in bed;with bed alarm set   Nurse Communication          Time: 6606-3016 OT Time Calculation (min): 8 min  Charges: OT General Charges $OT Visit: 1 Visit OT Treatments $Self Care/Home Management : 8-22 mins  Janice Coffin, COTA/L 11/23/2017, 9:56 AM

## 2017-11-24 DIAGNOSIS — E785 Hyperlipidemia, unspecified: Secondary | ICD-10-CM

## 2017-11-24 LAB — GLUCOSE, CAPILLARY
GLUCOSE-CAPILLARY: 104 mg/dL — AB (ref 70–99)
GLUCOSE-CAPILLARY: 166 mg/dL — AB (ref 70–99)
Glucose-Capillary: 117 mg/dL — ABNORMAL HIGH (ref 70–99)
Glucose-Capillary: 117 mg/dL — ABNORMAL HIGH (ref 70–99)
Glucose-Capillary: 168 mg/dL — ABNORMAL HIGH (ref 70–99)

## 2017-11-24 LAB — CBC
HEMATOCRIT: 26.7 % — AB (ref 39.0–52.0)
Hemoglobin: 8.5 g/dL — ABNORMAL LOW (ref 13.0–17.0)
MCH: 30.1 pg (ref 26.0–34.0)
MCHC: 31.8 g/dL (ref 30.0–36.0)
MCV: 94.7 fL (ref 80.0–100.0)
NRBC: 0 % (ref 0.0–0.2)
PLATELETS: 296 10*3/uL (ref 150–400)
RBC: 2.82 MIL/uL — AB (ref 4.22–5.81)
RDW: 14.6 % (ref 11.5–15.5)
WBC: 4.1 10*3/uL (ref 4.0–10.5)

## 2017-11-24 LAB — BASIC METABOLIC PANEL
ANION GAP: 6 (ref 5–15)
BUN: 38 mg/dL — AB (ref 8–23)
CHLORIDE: 104 mmol/L (ref 98–111)
CO2: 25 mmol/L (ref 22–32)
Calcium: 8.7 mg/dL — ABNORMAL LOW (ref 8.9–10.3)
Creatinine, Ser: 0.74 mg/dL (ref 0.61–1.24)
GFR calc Af Amer: >60 mL/min (ref >60)
GFR calc non Af Amer: >60 mL/min (ref >60)
GLUCOSE: 108 mg/dL — AB (ref 70–99)
POTASSIUM: 5 mmol/L (ref 3.5–5.1)
Sodium: 135 mmol/L (ref 135–145)

## 2017-11-24 MED ORDER — INSULIN ASPART 100 UNIT/ML ~~LOC~~ SOLN
0.0000 [IU] | Freq: Three times a day (TID) | SUBCUTANEOUS | Status: DC
  Administered 2017-11-25: 2 [IU] via SUBCUTANEOUS
  Administered 2017-11-26 (×2): 1 [IU] via SUBCUTANEOUS
  Administered 2017-11-28 – 2017-11-29 (×4): 2 [IU] via SUBCUTANEOUS
  Administered 2017-11-29 – 2017-11-30 (×3): 1 [IU] via SUBCUTANEOUS
  Administered 2017-12-01: 2 [IU] via SUBCUTANEOUS
  Administered 2017-12-01: 1 [IU] via SUBCUTANEOUS

## 2017-11-24 NOTE — Progress Notes (Signed)
PROGRESS NOTE    [name] [name]  YTK:[id number] DOB: [birth year] DOA: 11/16/2017 PCP: Beckie Salts, MD    Brief Narrative: 72 year old male with a history of quadriparesis with a history of contractures, poor compliance, diabetes mellitus type 2, hyperlipidemia, hypertension, iron deficiency, depression, decubitus ulcers presenting with increasing edema and drainage from the right upper extremity.  The patient is a poor historian.  Most of his history is obtained from review the medical record.  The patient usually goes to [facility] to get his wound care, but has not been to the wound care center since 10/19.  At the time of admission, the patient has been afebrile and hemodynamically stable.  He denies any recent injuries or trauma to the hand.    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  Dr. Burney Gauze with orthopedics recommended transfer to [facility] for further evaluation and for possible surgery.  Patient is currently on IV vancomycin and continue the same.  We will transition to an oral antibiotic on discharge. Wound consult/care recommendations to start hydrotherapy and physical therapy recommends that patient is not a candidate for hydrotherapy at this time Dr. Burney Gauze plans to proceed with flexor tenotomy versus lengthening of digital wrist flexors with wound debridement as necessary next Wednesday as the procedure got cancelled due to hyperkalemia.  He remains afebrile and does not have any elevated WBC count.  Currently waiting for bed at  SNF.    Hypertension  well controlled no changes in medications   Type 2 diabetes mellitus CBG (last 3)  Recent Labs    11/24/17 0701 11/24/17 1211 11/24/17 1612  GLUCAP 104* 117* 168*   Sliding scale insulin, no changes in medications   Anemia of chronic disease Continue to monitor and transfuse to keep hemoglobin greater than 7. Hemoglobin at 8.5 No signs of overt bleeding.   Multiple lower extremity wounds  wound care consulted and recommendations given. Scattered scabbed lesions on the lower extremities which are dry.  Will need follow-up with wound care center on discharge.  Hyperkalemia Resolved with Kayexalate   DVT prophylaxis: Lovenox Code Status: Full code Family Communication: None at bedside today Disposition Plan: SNF when bed available   Consultants:   Dr. Burney Gauze  Procedures: None Antimicrobials: IV vancomycin Subjective: Patient appears comfortable denies any chest pain or shortness of breath is willing to go to SNF when bed available. Objective: Vitals:   11/23/17 1955 11/24/17 0454 11/24/17 1000 11/24/17 1447  BP: 97/63 91/66 109/69 116/64  Pulse: 90 93 84 91  Resp:   16 15  Temp: 98.6 F (37 C) 98.4 F (36.9 C) 99.1 F (37.3 C) 97.8 F (36.6 C)  TempSrc: Oral Oral Oral Oral  SpO2: 97% 100% 100% 100%  Weight:      Height:        Intake/Output Summary (Last 24 hours) at 11/24/2017 1630 Last data filed at 11/24/2017 1307 Gross per 24 hour  Intake 480 ml  Output 1900 ml  Net -1420 ml   [employer]  11/16/17 1526 11/22/17 1240  Weight: 65.8 kg 65.8 kg    Examination:  General exam: Comfortable and laying in bed. Respiratory system: Clear to auscultation, no wheezing or rhonchi Cardiovascular system: S1 & S2 heard, RRR. No JVD, No pedal edema. Gastrointestinal system: Abdomen is soft, nontender, nondistended bowel sounds are good Central nervous system: Alert and oriented.  Mostly nonfocal Extremities: Severe right  digital contractions and a right wrist contraction with skin breakdown on the right right hand with pus drainage, no changes.  Multiple wounds on the lower extremities bandaged. Skin: See above Psychiatry:  Mood & affect appropriate.     Data Reviewed: I have personally reviewed following labs and imaging studies  CBC: Recent Labs  Lab 11/20/17 0320 11/22/17 0136 11/22/17 1333 11/24/17 0725  WBC 5.8 6.8  --  4.1  NEUTROABS 3.5  --   --   --   HGB 9.7* 9.0* 9.2* 8.5*  HCT 30.4* 28.3* 27.0* 26.7*  MCV 94.7 95.6  --  94.7  PLT 332 313  --  096   Basic Metabolic Panel: Recent Labs  Lab 11/20/17 0320 11/21/17 1236 11/22/17 1203 11/22/17 1333 11/23/17 0729 11/24/17 0725  NA 136 135 135 136 136 135  K 5.4* 5.5* 6.4* 6.1* 5.1 5.0  CL 105 104 106  --  105 104  CO2 25 26 24   --  28 25  GLUCOSE 144* 166* 99 96 107* 108*  BUN 37* 31* 38*  --  31* 38*  CREATININE 0.96 0.84 0.85  --  0.76 0.74  CALCIUM 8.7* 8.5* 8.8*  --  9.0 8.7*   GFR: Estimated Creatinine Clearance: 77.7 mL/min (by C-G formula based on SCr of 0.74 mg/dL). Liver Function Tests: No results for input(s): AST, ALT, ALKPHOS, BILITOT, PROT, ALBUMIN in the last 168 hours. No results for input(s): LIPASE, AMYLASE in the last 168 hours. No results for input(s): AMMONIA in the last 168 hours. Coagulation Profile: No results for input(s): INR, PROTIME in the last 168 hours. Cardiac Enzymes: No results for input(s): CKTOTAL, CKMB, CKMBINDEX, TROPONINI in the last 168 hours. BNP (last 3 results) No results for input(s): PROBNP in the last 8760 hours. HbA1C: No results for input(s): HGBA1C in the last 72 hours. CBG: Recent Labs  Lab 11/23/17 1959 11/24/17 0107 11/24/17 0701 11/24/17 1211 11/24/17 1612  GLUCAP 143* 117* 104* 117* 168*   Lipid Profile: No results for input(s): CHOL, HDL, LDLCALC, TRIG, CHOLHDL, LDLDIRECT in the last 72 hours. Thyroid Function Tests: No results for input(s): TSH, T4TOTAL,  FREET4, T3FREE, THYROIDAB in the last 72 hours. Anemia Panel: No results for input(s): VITAMINB12, FOLATE, FERRITIN, TIBC, IRON, RETICCTPCT in the last 72 hours. Sepsis Labs: No results for input(s): PROCALCITON, LATICACIDVEN in the last 168 hours.  Recent Results (from the past 240 hour(s))  MRSA PCR Screening     Status: None   Collection Time: 11/16/17 11:58 PM  Result Value Ref Range Status   MRSA by PCR NEGATIVE NEGATIVE Final    Comment:        The GeneXpert MRSA Assay (FDA approved for NASAL specimens only), is one component of a comprehensive MRSA colonization surveillance program. It is not intended to diagnose MRSA infection nor to guide or monitor treatment for MRSA infections. Performed at [facility], [place] [address], [place], [place] [address]          Radiology Studies: No results found.      Scheduled Meds: . amLODipine  5 mg  Oral Daily  . collagenase   Topical Daily  . enoxaparin (LOVENOX) injection  40 mg Subcutaneous Q24H  . Influenza vac split quadrivalent PF  0.5 mL Intramuscular Tomorrow-1000  . insulin aspart  0-9 Units Subcutaneous Q4H  . multivitamin with minerals  1 tablet Oral Daily  . nutrition supplement (JUVEN)  1 packet Oral BID BM  . sodium chloride flush  3 mL Intravenous Q12H   Continuous Infusions: . sodium chloride 250 mL (11/16/17 2224)  . lactated ringers 10 mL/hr at 11/22/17 1250  . vancomycin 1,000 mg (11/23/17 2221)     LOS: 8 days    Time spent: 25 minutes   Hosie Poisson, MD [facility] Pager [phone]  If 7PM-7AM, please contact night-coverage [email or web address] Password [email or web address] 11/24/2017, 4:30 PM

## 2017-11-24 NOTE — Plan of Care (Signed)
  Problem: Health Behavior/Discharge Planning: Goal: Ability to manage health-related needs will improve Outcome: Progressing   Problem: Clinical Measurements: Goal: Ability to maintain clinical measurements within normal limits will improve Outcome: Progressing Goal: Will remain free from infection Outcome: Progressing Goal: Diagnostic test results will improve Outcome: Progressing Goal: Respiratory complications will improve Outcome: Progressing Goal: Cardiovascular complication will be avoided Outcome: Progressing   Problem: Activity: Goal: Risk for activity intolerance will decrease Outcome: Progressing   Problem: Pain Managment: Goal: General experience of comfort will improve Outcome: Progressing   Problem: Elimination: Goal: Will not experience complications related to bowel motility Outcome: Progressing Goal: Will not experience complications related to urinary retention Outcome: Progressing   Problem: Safety: Goal: Ability to remain free from injury will improve Outcome: Progressing   Problem: Skin Integrity: Goal: Risk for impaired skin integrity will decrease Outcome: Progressing

## 2017-11-24 NOTE — Progress Notes (Signed)
Patient had midline placed 11/18/17 and received vancomycin every day since through this line.  Per protocol, vancomycin cannot be infused any more through this midline.  Kristi, RN not sure at this time how much longer patient will receive IV vancomycin. Steffanie Faley will follow-up and let IV team know if patient needs another IV site vs PICC for vancomycin.

## 2017-11-24 NOTE — Progress Notes (Signed)
Karleen Hampshire, MD paged regarding IV team's concern about duration of IV vancomycin through midline. Karleen Hampshire, MD aware. Will continue to monitor.

## 2017-11-24 NOTE — Progress Notes (Signed)
Dressings changed as directed. Pt tolerated well. Will continue to monitor.

## 2017-11-24 NOTE — Progress Notes (Signed)
Pharmacy Antibiotic Note  [name] [name] is a 72 y.o. male admitted on 11/16/2017 with severe wrist and digital flexion contracture with wound issues on the right side - multiple wounds on hands and legs. Ortho plans flexor tenotomy versus lengthening of digital wrist flexorswith wound debridement as necessary on 11/13. Pharmacy has been consulted for vancomycin dosing - day #9 (refused dose 11/3).   Patient has remained afebrile, WBC down to 4.1 and SCr stable. Will get vancomycin trough tomorrow.  Plan: Continue Vancomycin to 1000 mg IV q24h Monitor clinical progress, and renal function F/u de-escalation plan/LOT, vanc trough on 11/11   Height: 5' 7.5" (171.5 cm) Weight: 145 lb (65.8 kg) IBW/kg (Calculated) : 67.25  Temp (24hrs), Avg:98.7 F (37.1 C), Min:98.4 F (36.9 C), Max:99.1 F (37.3 C)  Recent Labs  Lab 11/20/17 0320 11/21/17 1236 11/21/17 1554 11/22/17 0136 11/22/17 1203 11/23/17 0729 11/24/17 0725  WBC 5.8  --   --  6.8  --   --  4.1  CREATININE 0.96 0.84  --   --  0.85 0.76 0.74  VANCOTROUGH  --   --  21*  --   --   --   --     Estimated Creatinine Clearance: 77.7 mL/min (by C-G formula based on SCr of 0.74 mg/dL).    Allergies  Allergen Reactions  . Lisinopril Other (See Comments)    Hyperkalemia.    Antimicrobials this admission: 11/2 vancomycin >>  Dose adjustments this admission: 11/7 VT = 21 (decreased to 1g Q24)  Microbiology results: 11/2 MRSA PCR: negative  Thank you for allowing [place] to participate in this patients care.   Jackson Latino, PharmD PGY1 Pharmacy Resident Phone [phone] 11/24/2017     11:01 AM

## 2017-11-24 NOTE — Plan of Care (Signed)
Problem: Health Behavior/Discharge Planning: Goal: Ability to manage health-related needs will improve Outcome: Progressing   Problem: Clinical Measurements: Goal: Respiratory complications will improve Outcome: Progressing   Problem: Nutrition: Goal: Adequate nutrition will be maintained Outcome: Progressing   Problem: Elimination: Goal: Will not experience complications related to bowel motility Outcome: Progressing Goal: Will not experience complications related to urinary retention Outcome: Progressing   Problem: Pain Managment: Goal: General experience of comfort will improve Outcome: Progressing   Problem: Safety: Goal: Ability to remain free from injury will improve Outcome: Progressing   Problem: Skin Integrity: Goal: Risk for impaired skin integrity will decrease Outcome: Progressing

## 2017-11-25 ENCOUNTER — Other Ambulatory Visit: Payer: Self-pay | Admitting: Orthopedic Surgery

## 2017-11-25 LAB — GLUCOSE, CAPILLARY
GLUCOSE-CAPILLARY: 132 mg/dL — AB (ref 70–99)
Glucose-Capillary: 103 mg/dL — ABNORMAL HIGH (ref 70–99)
Glucose-Capillary: 98 mg/dL (ref 70–99)
Glucose-Capillary: 153 mg/dL — ABNORMAL HIGH (ref 70–99)

## 2017-11-25 LAB — VANCOMYCIN, TROUGH: Vancomycin Tr: 27 ug/mL (ref 15–20)

## 2017-11-25 MED ORDER — VANCOMYCIN HCL IN DEXTROSE 1-5 GM/200ML-% IV SOLN: 1000.0000 mg | INTRAVENOUS | Status: DC

## 2017-11-25 NOTE — Progress Notes (Signed)
Nutrition Follow-up  DOCUMENTATION CODES:   Non-severe (moderate) malnutrition in context of chronic illness  INTERVENTION:    Continue MVI daily and Juven BID  NUTRITION DIAGNOSIS:   Moderate Malnutrition related to chronic illness as evidenced by energy intake < or equal to 75% for > or equal to 1 month, severe fat depletion, severe muscle depletion.  Ongoing  GOAL:   Patient will meet greater than or equal to 90% of their needs  Met with intake of meals and supplements  MONITOR:   PO intake, Supplement acceptance, Labs, Weight trends, Skin, I & O's   ASSESSMENT:   72 year old male with a history of quadriparesis with a history of contractures, poor compliance, diabetes mellitus type 2, hyperlipidemia, hypertension, iron deficiency, depression, decubitus ulcers presenting with increasing edema and drainage from the right upper extremity.    Patient is consuming 50-100% of meals. Appetite improving. Also receiving MVI daily and Juven BID to support wound healing.    Labs reviewed. CBG's: [phone] Medications reviewed and include novolog, MVI.   Diet Order:   Diet Order            Diet Carb Modified Fluid consistency: Thin; Room service appropriate? Yes  Diet effective now              EDUCATION NEEDS:   Not appropriate for education at this time  Skin:  Skin Assessment: Skin Integrity Issues: Skin Integrity Issues:: Stage II, Stage III, Other (Comment) Stage II: coccyx Stage III: sacral Other: open wound on rt hand, right pertibial non-pressure wound  Last BM:  11/4  Height:   Ht Readings from Last 1 Encounters:  11/22/17 5' 7.5" (1.715 m)    Weight:   Wt Readings from Last 1 Encounters:  11/22/17 65.8 kg    Ideal Body Weight:  63 kg(adjusted for quadriplegia)  BMI:  Body mass index is 22.38 kg/m.  Estimated Nutritional Needs:   Kcal:  1700-1900  Protein:  100-110g  Fluid:  1.7L/day    Molli Barrows, RD, LDN, [place] Pager  [phone] After Hours Pager [phone]

## 2017-11-25 NOTE — Plan of Care (Signed)
  Problem: Health Behavior/Discharge Planning: Goal: Ability to manage health-related needs will improve Outcome: Progressing   Problem: Activity: Goal: Risk for activity intolerance will decrease Outcome: Progressing   Problem: Pain Managment: Goal: General experience of comfort will improve Outcome: Progressing   Problem: Safety: Goal: Ability to remain free from injury will improve Outcome: Progressing   Problem: Skin Integrity: Goal: Risk for impaired skin integrity will decrease Outcome: Progressing

## 2017-11-25 NOTE — Progress Notes (Addendum)
PROGRESS NOTE    [name] [name]  XTG:[id number] DOB: 1945-11-01 DOA: 11/16/2017 PCP: Beckie Salts, MD    Brief Narrative: 72 year old male with a history of quadriparesis with a history of contractures, poor compliance, diabetes mellitus type 2, hyperlipidemia, hypertension, iron deficiency, depression, decubitus ulcers presenting with increasing edema and drainage from the right upper extremity.  The patient is a poor historian.  Most of his history is obtained from review the medical record.  The patient usually goes to [facility] to get his wound care, but has not been to the wound care center since 10/19.  At the time of admission, the patient has been afebrile and hemodynamically stable.  He denies any recent injuries or trauma to the hand.    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  Dr. Burney Gauze with orthopedics recommended transfer to [facility] for further evaluation and for possible surgery.  Patient completed 7 days of IV vancomycin , recommend another 2 days of IV antibiotics till surgery.   Wound consult/care recommendations to start hydrotherapy and physical therapy recommends that patient is not a candidate for hydrotherapy at this time Dr. Burney Gauze plans to proceed with flexor tenotomy versus lengthening of digital wrist flexors with wound debridement as necessary next Wednesday as the procedure got cancelled due to hyperkalemia.  He remains afebrile and does not have any elevated WBC count.       Hypertension  well controlled, no changes  in medications   Type 2 diabetes mellitus CBG (last 3)  Recent Labs    11/25/17 0639 11/25/17 1140 11/25/17 1623  GLUCAP 103* 98 153*   Sliding scale insulin, no changes in medications   Anemia of chronic disease Continue to monitor and transfuse to keep hemoglobin greater than 7. Hemoglobin at 8.5 No signs of overt bleeding.   Multiple lower extremity wounds  wound care consulted and recommendations given. Scattered scabbed lesions on the lower extremities which are dry.  Will need follow-up with wound care center on discharge.  Hyperkalemia Resolved with Kayexalate   DVT prophylaxis: Lovenox Code Status: Full code Family Communication: None at bedside today Disposition Plan: SNF when bed available   Consultants:   Dr. Burney Gauze  Procedures: None Antimicrobials: IV vancomycin Subjective: No chest pain or sob,  Objective: Vitals:   11/24/17 1447 11/24/17 2128 11/25/17 0632 11/25/17 1029  BP: 116/64 106/73 110/75 96/66  Pulse: 91 84 91 84  Resp: 15     Temp: 97.8 F (36.6 C) 98.3 F (36.8 C) 97.8 F (36.6 C) 98.6 F (37 C)  TempSrc: Oral Oral Oral Oral  SpO2: 100% 100% 100%   Weight:      Height:        Intake/Output Summary (Last 24 hours) at 11/25/2017 1817 Last data filed at 11/25/2017 0635 Gross per 24 hour  Intake -  Output 1300 ml  Net -1300 ml   Filed Weights   11/16/17 1526 11/22/17 1240  Weight: 65.8 kg 65.8 kg    Examination: no change in exam  General exam: Comfortable and laying in bed. Respiratory system: Clear to auscultation, no wheezing or rhonchi Cardiovascular system: S1 & S2 heard, RRR. No JVD, No pedal edema. Gastrointestinal system: Abdomen is soft, nontender, nondistended bowel sounds are good Central nervous system: Alert and oriented.  Mostly nonfocal Extremities: Severe right digital contractions and a right wrist contraction with skin breakdown on the right right hand with pus drainage, no changes.  Multiple wounds  on the lower extremities bandaged. Skin: See above Psychiatry:  Mood & affect appropriate.     Data Reviewed: I have personally reviewed following labs and imaging studies  CBC: Recent Labs  Lab 11/20/17 0320 11/22/17 0136 11/22/17 1333 11/24/17 0725  WBC 5.8 6.8  --  4.1  NEUTROABS 3.5  --   --   --   HGB 9.7* 9.0* 9.2* 8.5*  HCT 30.4* 28.3* 27.0* 26.7*  MCV 94.7 95.6  --  94.7  PLT 332 313  --  371   Basic Metabolic Panel: Recent Labs  Lab 11/20/17 0320 11/21/17 1236 11/22/17 1203 11/22/17 1333 11/23/17 0729 11/24/17 0725  NA 136 135 135 136 136 135  K 5.4* 5.5* 6.4* 6.1* 5.1 5.0  CL 105 104 106  --  105 104  CO2 25 26 24   --  28 25  GLUCOSE 144* 166* 99 96 107* 108*  BUN 37* 31* 38*  --  31* 38*  CREATININE 0.96 0.84 0.85  --  0.76 0.74  CALCIUM 8.7* 8.5* 8.8*  --  9.0 8.7*   GFR: Estimated Creatinine Clearance: 77.7 mL/min (by C-G formula based on SCr of 0.74 mg/dL). Liver Function Tests: No results for input(s): AST, ALT, ALKPHOS, BILITOT, PROT, ALBUMIN in the last 168 hours. No results for input(s): LIPASE, AMYLASE in the last 168 hours. No results for input(s): AMMONIA in the last 168 hours. Coagulation Profile: No results for input(s): INR, PROTIME in the last 168 hours. Cardiac Enzymes: No results for input(s): CKTOTAL, CKMB, CKMBINDEX, TROPONINI in the last 168 hours. BNP (last 3 results) No results for input(s): PROBNP in the last 8760 hours. HbA1C: No results for input(s): HGBA1C in the last 72 hours. CBG: Recent Labs  Lab 11/24/17 1612 11/24/17 2128 11/25/17 0639 11/25/17 1140 11/25/17 1623  GLUCAP 168* 166* 103* 98 153*   Lipid Profile: No results for input(s): CHOL, HDL, LDLCALC, TRIG, CHOLHDL, LDLDIRECT in the last 72 hours. Thyroid Function Tests: No results for input(s): TSH, T4TOTAL, FREET4, T3FREE, THYROIDAB in the last 72 hours. Anemia Panel: No results for input(s): VITAMINB12, FOLATE, FERRITIN, TIBC, IRON, RETICCTPCT in the  last 72 hours. Sepsis Labs: No results for input(s): PROCALCITON, LATICACIDVEN in the last 168 hours.  Recent Results (from the past 240 hour(s))  MRSA PCR Screening     Status: None   Collection Time: 11/16/17 11:58 PM  Result Value Ref Range Status   MRSA by PCR NEGATIVE NEGATIVE Final    Comment:        The GeneXpert MRSA Assay (FDA approved for NASAL specimens only), is one component of a comprehensive MRSA colonization surveillance program. It is not intended to diagnose MRSA infection nor to guide or monitor treatment for MRSA infections. Performed at [facility], [place] [address], [place], [place] [address]          Radiology Studies: No results found.      Scheduled Meds: . amLODipine  5 mg Oral Daily  . collagenase   Topical Daily  . enoxaparin (LOVENOX) injection  40 mg Subcutaneous Q24H  . Influenza vac split quadrivalent PF  0.5 mL Intramuscular Tomorrow-1000  . insulin aspart  0-9 Units Subcutaneous TID WC  . multivitamin with minerals  1 tablet Oral Daily  . nutrition supplement (JUVEN)  1 packet Oral BID BM  . sodium chloride flush  3 mL Intravenous Q12H   Continuous Infusions: . sodium chloride 250 mL (11/16/17 2224)  . lactated ringers 10 mL/hr at 11/24/17 2043  . vancomycin 1,000 mg (11/24/17 2043)     LOS: 9 days    Time spent: 25 minutes   Hosie Poisson, MD [facility] Pager [phone]  If 7PM-7AM, please contact night-coverage [email or web address] Password [email or web address] 11/25/2017, 6:17 PM

## 2017-11-25 NOTE — Progress Notes (Addendum)
Pharmacy Antibiotic Note  [name] [name] is a 72 y.o. male admitted on 11/16/2017 with severe wrist and digital flexion contracture with wound issues on the right side - multiple wounds on hands and legs. Ortho plans flexor tenotomy versus lengthening of digital wrist flexorswith wound debridement scheduled at 12:45 on 11/13.  Pharmacy has been consulted for vancomycin dosing - day #10    Vancomycin trough level tonight is 27 mcg/ml.  Vanc 1 gram IV dose was in process when supratherapeutic trough reported and infusion was stopped.  Estimate about half of dose infused, so level may be ~35 mcg/ml at this time.  Plan:  Decrease Vancomycin to 750 mg IV q24hrs, but will set to begin on 11/13 am.  Random Vancomycin level on 11/13 am, to be sure level is below 20 mcg/ml  Target vanc troughs 15-20 mcg/ml.  Q72h bmet due on 11/13.  Will check bmet in am and modify dosing plan if indicated.  Follow renal function, clinical course, antibiotic plans.   Height: 5' 7.5" (171.5 cm) Weight: 145 lb (65.8 kg) IBW/kg (Calculated) : 67.25  Temp (24hrs), Avg:98.5 F (36.9 C), Min:97.8 F (36.6 C), Max:99.2 F (37.3 C)  Recent Labs  Lab 11/20/17 0320 11/21/17 1236 11/21/17 1554 11/22/17 0136 11/22/17 1203 11/23/17 0729 11/24/17 0725 11/25/17 2039  WBC 5.8  --   --  6.8  --   --  4.1  --   CREATININE 0.96 0.84  --   --  0.85 0.76 0.74  --   VANCOTROUGH  --   --  21*  --   --   --   --  27*    Estimated Creatinine Clearance: 77.7 mL/min (by C-G formula based on SCr of 0.74 mg/dL).    Allergies  Allergen Reactions  . Lisinopril Other (See Comments)    Hyperkalemia.    Antimicrobials this admission:   Vancomycin 11/2 >>   Doxycycline 100 mg PO BID 11/3>>11/4 (2 doses given)  Dose adjustments this admission:  11/7 VT 21 mcg/ml on 1250 mg IV q24hrs - > decr 1 gm IV q24hrs  11/11 VT 27 mcg/ml on 1 gm IV q24hrs - > decr to 750 mg IV q24hrs   Microbiology results: 11/2 MRSA PCR:  negative  Thank you for allowing [place] to participate in this patients care.   Arty Baumgartner, [place] Pager: [phone]  11/25/2017     10:32 PM

## 2017-11-26 ENCOUNTER — Other Ambulatory Visit: Payer: Self-pay | Admitting: Orthopedic Surgery

## 2017-11-26 LAB — BASIC METABOLIC PANEL
ANION GAP: 7 (ref 5–15)
BUN: 53 mg/dL — ABNORMAL HIGH (ref 8–23)
CO2: 22 mmol/L (ref 22–32)
CREATININE: 0.99 mg/dL (ref 0.61–1.24)
Calcium: 8.6 mg/dL — ABNORMAL LOW (ref 8.9–10.3)
Chloride: 105 mmol/L (ref 98–111)
GLUCOSE: 100 mg/dL — AB (ref 70–99)
Potassium: 5.9 mmol/L — ABNORMAL HIGH (ref 3.5–5.1)
Sodium: 134 mmol/L — ABNORMAL LOW (ref 135–145)

## 2017-11-26 LAB — POTASSIUM: POTASSIUM: 6 mmol/L — AB (ref 3.5–5.1)

## 2017-11-26 LAB — GLUCOSE, CAPILLARY
GLUCOSE-CAPILLARY: 148 mg/dL — AB (ref 70–99)
Glucose-Capillary: 84 mg/dL (ref 70–99)
Glucose-Capillary: 127 mg/dL — ABNORMAL HIGH (ref 70–99)
Glucose-Capillary: 124 mg/dL — ABNORMAL HIGH (ref 70–99)

## 2017-11-26 MED ORDER — VANCOMYCIN VARIABLE DOSE PER UNSTABLE RENAL FUNCTION (PHARMACIST DOSING): Status: DC

## 2017-11-26 MED ORDER — SODIUM POLYSTYRENE SULFONATE 15 GM/60ML PO SUSP
30.0000 g | Freq: Once | ORAL | Status: AC
  Administered 2017-11-26: 30 g via ORAL
  Filled 2017-11-26: qty 120

## 2017-11-26 NOTE — Progress Notes (Signed)
OT Cancellation Note  Patient Details Name: Kejon Feild MRN: [id number] DOB: 22-Feb-1945   Cancelled Treatment:    Reason Eval/Treat Not Completed: Patient declined, no reason specified;Other (comment)(Pt to go to sx tomorrow as well)  Merri Ray Lynita Groseclose 11/26/2017, 4:31 PM  Hulda Humphrey OTR/L Acute Rehabilitation Services Pager: [phone] Office: [phone]

## 2017-11-26 NOTE — Plan of Care (Signed)
?  Problem: Health Behavior/Discharge Planning: ?Goal: Ability to manage health-related needs will improve ?Outcome: Progressing ?  ?Problem: Clinical Measurements: ?Goal: Ability to maintain clinical measurements within normal limits will improve ?Outcome: Progressing ?Goal: Will remain free from infection ?Outcome: Progressing ?Goal: Diagnostic test results will improve ?Outcome: Progressing ?Goal: Respiratory complications will improve ?Outcome: Progressing ?Goal: Cardiovascular complication will be avoided ?Outcome: Progressing ?  ?Problem: Activity: ?Goal: Risk for activity intolerance will decrease ?Outcome: Progressing ?  ?Problem: Nutrition: ?Goal: Adequate nutrition will be maintained ?Outcome: Progressing ?  ?Problem: Elimination: ?Goal: Will not experience complications related to bowel motility ?Outcome: Progressing ?Goal: Will not experience complications related to urinary retention ?Outcome: Progressing ?  ?Problem: Pain Managment: ?Goal: General experience of comfort will improve ?Outcome: Progressing ?  ?Problem: Safety: ?Goal: Ability to remain free from injury will improve ?Outcome: Progressing ?  ?Problem: Skin Integrity: ?Goal: Risk for impaired skin integrity will decrease ?Outcome: Progressing ?  ?

## 2017-11-26 NOTE — Plan of Care (Signed)
  Problem: Health Behavior/Discharge Planning: Goal: Ability to manage health-related needs will improve Outcome: Progressing   Problem: Activity: Goal: Risk for activity intolerance will decrease Outcome: Progressing   Problem: Pain Managment: Goal: General experience of comfort will improve Outcome: Progressing   Problem: Safety: Goal: Ability to remain free from injury will improve Outcome: Progressing   Problem: Skin Integrity: Goal: Risk for impaired skin integrity will decrease Outcome: Progressing

## 2017-11-26 NOTE — Progress Notes (Signed)
PROGRESS NOTE    Hanley Woerner  QIH:[id number] DOB: 1945/04/06 DOA: 11/16/2017 PCP: Beckie Salts, MD    Brief Narrative: 72 year old male with a history of quadriparesis with a history of contractures, poor compliance, diabetes mellitus type 2, hyperlipidemia, hypertension, iron deficiency, depression, decubitus ulcers presenting with increasing edema and drainage from the right upper extremity.  The patient is a poor historian.  Most of his history is obtained from review the medical record.  The patient usually goes to [facility] to get his wound care, but has not been to the wound care center since 10/19.  At the time of admission, the patient has been afebrile and hemodynamically stable.  He denies any recent injuries or trauma to the hand.    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  Dr. Burney Gauze with orthopedics recommended transfer to [facility] for further evaluation and for possible surgery.  Patient completed 8 days of IV vancomycin , recommend another 1 days of IV antibiotics till surgery.   Wound consult/care recommendations to start hydrotherapy and physical therapy recommends that patient is not a candidate for hydrotherapy at this time Dr. Burney Gauze plans to proceed with flexor tenotomy versus lengthening of digital wrist flexors with wound debridement as necessary tomorrow  as the procedure got cancelled due to hyperkalemia.  He remains afebrile and does not have any elevated WBC count.   Pt NPO after midnight.      Hypertension  well  controlled, no changes in medications   Type 2 diabetes mellitus CBG (last 3)  Recent Labs    11/25/17 1623 11/25/17 2105 11/26/17 0654  GLUCAP 153* 132* 84   Sliding scale insulin, no changes in medications   Anemia of chronic disease Continue to monitor and transfuse to keep hemoglobin greater than 7. Hemoglobin at 8.5 No signs of overt bleeding.   Multiple lower extremity wounds  wound care consulted and recommendations given. Scattered scabbed lesions on the lower extremities which are dry.  Will need follow-up with wound care center on discharge.  Hyperkalemia Another dose of kayexalate ordered, repeat k tonight.    DVT prophylaxis: Lovenox Code Status: Full code Family Communication: None at bedside today Disposition Plan: SNF when bed available   Consultants:   Dr. Burney Gauze  Procedures: None Antimicrobials: IV vancomycin day 8.    Subjective: No chest pain or sob, no new complaints.   Objective: Vitals:   11/25/17 1029 11/25/17 2114 11/26/17 0414 11/26/17 1029  BP: 96/66 (!) 102/58 93/61 104/72  Pulse: 84 85 87   Resp:  16 16   Temp: 98.6 F (37 C) 99.2 F (37.3 C) 98.4 F (36.9 C)   TempSrc: Oral Oral Oral   SpO2:  100% 98%   Weight:      Height:        Intake/Output Summary (Last 24 hours) at 11/26/2017 1109 Last data filed at 11/26/2017 0500 Gross per 24 hour  Intake -  Output 1650 ml  Net -1650 ml   [employer]  11/16/17 1526 11/22/17 1240  Weight: 65.8 kg 65.8 kg    Examination:  General exam: alert and comfortable.  Respiratory system: Clear to auscultation, no wheezing or rhonchi Cardiovascular system: S1 & S2 heard, RRR. No JVD, No pedal edema. Gastrointestinal system: Abdomen is soft, NT ND SB+ Central nervous system: Alert and oriented.  Mostly nonfocal Extremities: Severe right digital contractions and a right wrist contraction with skin breakdown on the right right hand with pus drainage, BANDAGED.    Multiple  wounds on the lower extremities bandaged. Skin: See above Psychiatry:  Mood & affect appropriate.     Data Reviewed: I have personally reviewed following labs and imaging studies  CBC: Recent Labs  Lab 11/20/17 0320 11/22/17 0136 11/22/17 1333 11/24/17 0725  WBC 5.8 6.8  --  4.1  NEUTROABS 3.5  --   --   --   HGB 9.7* 9.0* 9.2* 8.5*  HCT 30.4* 28.3* 27.0* 26.7*  MCV 94.7 95.6  --  94.7  PLT 332 313  --  852   Basic Metabolic Panel: Recent Labs  Lab 11/21/17 1236 11/22/17 1203 11/22/17 1333 11/23/17 0729 11/24/17 0725 11/26/17 0438  NA 135 135 136 136 135 134*  K 5.5* 6.4* 6.1* 5.1 5.0 5.9*  CL 104 106  --  105 104 105  CO2 26 24  --  28 25 22   GLUCOSE 166* 99 96 107* 108* 100*  BUN 31* 38*  --  31* 38* 53*  CREATININE 0.84 0.85  --  0.76 0.74 0.99  CALCIUM 8.5* 8.8*  --  9.0 8.7* 8.6*   GFR: Estimated Creatinine Clearance: 62.8 mL/min (by C-G formula based on SCr of 0.99 mg/dL). Liver Function Tests: No results for input(s): AST, ALT, ALKPHOS, BILITOT, PROT, ALBUMIN in the last 168 hours. No results for input(s): LIPASE, AMYLASE in the last 168 hours. No results for input(s): AMMONIA in the last 168 hours. Coagulation Profile: No results for input(s): INR, PROTIME in the last 168 hours. Cardiac Enzymes: No results for input(s): CKTOTAL, CKMB, CKMBINDEX, TROPONINI in the last 168 hours. BNP (last 3 results) No results for input(s): PROBNP in the last 8760 hours. HbA1C: No results for input(s): HGBA1C in the last 72 hours. CBG: Recent Labs  Lab 11/25/17 0639 11/25/17 1140 11/25/17 1623 11/25/17 2105 11/26/17 0654  GLUCAP 103* 98 153* 132* 84   Lipid Profile: No results for input(s): CHOL, HDL, LDLCALC, TRIG, CHOLHDL, LDLDIRECT in the last 72 hours. Thyroid Function Tests: No results for input(s): TSH, T4TOTAL, FREET4, T3FREE, THYROIDAB in the last 72 hours. Anemia Panel: No results for input(s): VITAMINB12, FOLATE, FERRITIN, TIBC, IRON, RETICCTPCT in  the last 72 hours. Sepsis Labs: No results for input(s): PROCALCITON, LATICACIDVEN in the last 168 hours.  Recent Results (from the past 240 hour(s))  MRSA PCR Screening     Status: None   Collection Time: 11/16/17 11:58 PM  Result Value Ref Range Status   MRSA by PCR NEGATIVE NEGATIVE Final    Comment:        The GeneXpert MRSA Assay (FDA approved for NASAL specimens only), is one component of a comprehensive MRSA colonization surveillance program. It is not intended to diagnose MRSA infection nor to guide or monitor treatment for MRSA infections. Performed at [facility], [place] [address], [place], [place] [address]          Radiology Studies: No results found.      Scheduled Meds: . amLODipine  5 mg Oral Daily  .  collagenase   Topical Daily  . enoxaparin (LOVENOX) injection  40 mg Subcutaneous Q24H  . Influenza vac split quadrivalent PF  0.5 mL Intramuscular Tomorrow-1000  . insulin aspart  0-9 Units Subcutaneous TID WC  . multivitamin with minerals  1 tablet Oral Daily  . nutrition supplement (JUVEN)  1 packet Oral BID BM  . sodium chloride flush  3 mL Intravenous Q12H  . vancomycin variable dose per unstable renal function (pharmacist dosing)   Does not apply See admin instructions   Continuous Infusions: . sodium chloride 250 mL (11/16/17 2224)  . lactated ringers 10 mL/hr at 11/24/17 2043     LOS: 10 days    Time spent: 25 minutes   Hosie Poisson, MD [facility] Pager [phone]  If 7PM-7AM, please contact night-coverage [email or web address] Password [email or web address] 11/26/2017, 11:09 AM

## 2017-11-27 ENCOUNTER — Encounter (HOSPITAL_COMMUNITY): Payer: Self-pay | Admitting: Certified Registered"

## 2017-11-27 ENCOUNTER — Inpatient Hospital Stay (HOSPITAL_COMMUNITY): Payer: Medicare Other | Admitting: Certified Registered"

## 2017-11-27 ENCOUNTER — Encounter (HOSPITAL_COMMUNITY)
Admission: EM | Disposition: A | Discharge status: Skilled Nursing Facility | Payer: Self-pay | Source: Home / Self Care | Attending: Internal Medicine

## 2017-11-27 HISTORY — PX: FLEXOR TENDON REPAIR: SHX6501

## 2017-11-27 LAB — TYPE AND SCREEN
ABO/RH(D): O POS
ANTIBODY SCREEN: NEGATIVE

## 2017-11-27 LAB — BASIC METABOLIC PANEL
Anion gap: 6 (ref 5–15)
BUN: 53 mg/dL — ABNORMAL HIGH (ref 8–23)
CALCIUM: 8.7 mg/dL — AB (ref 8.9–10.3)
CO2: 22 mmol/L (ref 22–32)
CREATININE: 0.89 mg/dL (ref 0.61–1.24)
Chloride: 106 mmol/L (ref 98–111)
GFR calc non Af Amer: >60 mL/min (ref >60)
GLUCOSE: 124 mg/dL — AB (ref 70–99)
Potassium: 5.7 mmol/L — ABNORMAL HIGH (ref 3.5–5.1)
Sodium: 134 mmol/L — ABNORMAL LOW (ref 135–145)

## 2017-11-27 LAB — GLUCOSE, CAPILLARY
GLUCOSE-CAPILLARY: 100 mg/dL — AB (ref 70–99)
Glucose-Capillary: 98 mg/dL (ref 70–99)
Glucose-Capillary: 89 mg/dL (ref 70–99)
Glucose-Capillary: 87 mg/dL (ref 70–99)
Glucose-Capillary: 158 mg/dL — ABNORMAL HIGH (ref 70–99)

## 2017-11-27 LAB — ABO/RH: ABO/RH(D): O POS

## 2017-11-27 LAB — VANCOMYCIN, RANDOM: Vancomycin Rm: 20

## 2017-11-27 SURGERY — REPAIR, TENDON, FLEXOR
Anesthesia: General | Laterality: Right

## 2017-11-27 MED ORDER — 0.9 % SODIUM CHLORIDE (POUR BTL) OPTIME
TOPICAL | Status: DC | PRN
  Administered 2017-11-27: 1000 mL

## 2017-11-27 MED ORDER — SODIUM CHLORIDE (PF) 0.9 % IJ SOLN
INTRAMUSCULAR | Status: AC
  Filled 2017-11-27: qty 10

## 2017-11-27 MED ORDER — FENTANYL CITRATE (PF) 250 MCG/5ML IJ SOLN
INTRAMUSCULAR | Status: AC
  Filled 2017-11-27: qty 5

## 2017-11-27 MED ORDER — HYDROMORPHONE HCL 1 MG/ML IJ SOLN
INTRAMUSCULAR | Status: AC
  Administered 2017-11-27: 0.5 mg via INTRAVENOUS
  Filled 2017-11-27: qty 1

## 2017-11-27 MED ORDER — LACTATED RINGERS IV SOLN
INTRAVENOUS | Status: DC
  Administered 2017-11-27: 14:00:00 via INTRAVENOUS

## 2017-11-27 MED ORDER — OXYCODONE HCL 5 MG PO TABS: 5.0000 mg | ORAL_TABLET | Freq: Once | ORAL | Status: DC | PRN

## 2017-11-27 MED ORDER — PROPOFOL 10 MG/ML IV BOLUS
INTRAVENOUS | Status: DC | PRN
  Administered 2017-11-27: 160 mg via INTRAVENOUS

## 2017-11-27 MED ORDER — PROMETHAZINE HCL 25 MG/ML IJ SOLN: 6.2500 mg | INTRAMUSCULAR | Status: DC | PRN

## 2017-11-27 MED ORDER — BACITRACIN ZINC 500 UNIT/GM EX OINT
TOPICAL_OINTMENT | CUTANEOUS | Status: AC
  Filled 2017-11-27: qty 28.35

## 2017-11-27 MED ORDER — LIDOCAINE 2% (20 MG/ML) 5 ML SYRINGE
INTRAMUSCULAR | Status: AC
  Filled 2017-11-27: qty 5

## 2017-11-27 MED ORDER — CHLORHEXIDINE GLUCONATE 4 % EX LIQD
60.0000 mL | Freq: Once | CUTANEOUS | Status: AC
  Administered 2017-11-27: 4 via TOPICAL

## 2017-11-27 MED ORDER — OXYCODONE HCL 5 MG/5ML PO SOLN: 5.0000 mg | Freq: Once | ORAL | Status: DC | PRN

## 2017-11-27 MED ORDER — ONDANSETRON HCL 4 MG/2ML IJ SOLN
INTRAMUSCULAR | Status: DC | PRN
  Administered 2017-11-27: 4 mg via INTRAVENOUS

## 2017-11-27 MED ORDER — FENTANYL CITRATE (PF) 250 MCG/5ML IJ SOLN
INTRAMUSCULAR | Status: DC | PRN
  Administered 2017-11-27: 100 ug via INTRAVENOUS
  Administered 2017-11-27 (×2): 50 ug via INTRAVENOUS

## 2017-11-27 MED ORDER — SODIUM CHLORIDE 0.9 % IV SOLN
INTRAVENOUS | Status: DC
  Administered 2017-11-27 – 2017-12-01 (×3): via INTRAVENOUS

## 2017-11-27 MED ORDER — LIDOCAINE 2% (20 MG/ML) 5 ML SYRINGE
INTRAMUSCULAR | Status: DC | PRN
  Administered 2017-11-27: 60 mg via INTRAVENOUS

## 2017-11-27 MED ORDER — HYDROMORPHONE HCL 1 MG/ML IJ SOLN
0.2500 mg | INTRAMUSCULAR | Status: DC | PRN
  Administered 2017-11-27 (×2): 0.5 mg via INTRAVENOUS

## 2017-11-27 MED ORDER — PHENYLEPHRINE 40 MCG/ML (10ML) SYRINGE FOR IV PUSH (FOR BLOOD PRESSURE SUPPORT)
PREFILLED_SYRINGE | INTRAVENOUS | Status: AC
  Filled 2017-11-27: qty 10

## 2017-11-27 MED ORDER — PHENYLEPHRINE 40 MCG/ML (10ML) SYRINGE FOR IV PUSH (FOR BLOOD PRESSURE SUPPORT)
PREFILLED_SYRINGE | INTRAVENOUS | Status: DC | PRN
  Administered 2017-11-27: 120 ug via INTRAVENOUS
  Administered 2017-11-27: 160 ug via INTRAVENOUS
  Administered 2017-11-27: 120 ug via INTRAVENOUS

## 2017-11-27 MED ORDER — ONDANSETRON HCL 4 MG/2ML IJ SOLN
INTRAMUSCULAR | Status: AC
  Filled 2017-11-27: qty 2

## 2017-11-27 MED ORDER — PROPOFOL 10 MG/ML IV BOLUS
INTRAVENOUS | Status: AC
  Filled 2017-11-27: qty 20

## 2017-11-27 SURGICAL SUPPLY — 39 items
BANDAGE ACE 4X5 VEL STRL LF (GAUZE/BANDAGES/DRESSINGS) ×6 IMPLANT
BANDAGE ELASTIC 4 VELCRO ST LF (GAUZE/BANDAGES/DRESSINGS) ×3 IMPLANT
BNDG CMPR 9X4 STRL LF SNTH (GAUZE/BANDAGES/DRESSINGS) ×1
BNDG COHESIVE 1X5 TAN STRL LF (GAUZE/BANDAGES/DRESSINGS) ×3 IMPLANT
BNDG ESMARK 4X9 LF (GAUZE/BANDAGES/DRESSINGS) ×3 IMPLANT
BNDG GAUZE ELAST 4 BULKY (GAUZE/BANDAGES/DRESSINGS) ×3 IMPLANT
CLOSURE WOUND 1/2 X4 (GAUZE/BANDAGES/DRESSINGS) ×1
COVER SURGICAL LIGHT HANDLE (MISCELLANEOUS) ×3 IMPLANT
CUFF TOURNIQUET SINGLE 18IN (TOURNIQUET CUFF) ×3 IMPLANT
CUFF TOURNIQUET SINGLE 24IN (TOURNIQUET CUFF) IMPLANT
GAUZE SPONGE 2X2 8PLY STRL LF (GAUZE/BANDAGES/DRESSINGS) ×1 IMPLANT
GAUZE SPONGE 4X4 12PLY STRL LF (GAUZE/BANDAGES/DRESSINGS) ×3 IMPLANT
GLOVE SURG SYN 8.0 (GLOVE) ×3 IMPLANT
GOWN STRL REUS W/ TWL LRG LVL3 (GOWN DISPOSABLE) ×1 IMPLANT
GOWN STRL REUS W/ TWL XL LVL3 (GOWN DISPOSABLE) ×1 IMPLANT
GOWN STRL REUS W/TWL LRG LVL3 (GOWN DISPOSABLE) ×3
GOWN STRL REUS W/TWL XL LVL3 (GOWN DISPOSABLE) ×3
KIT BASIN OR (CUSTOM PROCEDURE TRAY) ×3 IMPLANT
KIT TURNOVER KIT B (KITS) ×3 IMPLANT
NS IRRIG 1000ML POUR BTL (IV SOLUTION) ×3 IMPLANT
PACK ORTHO EXTREMITY (CUSTOM PROCEDURE TRAY) ×3 IMPLANT
PAD ARMBOARD 7.5X6 YLW CONV (MISCELLANEOUS) ×6 IMPLANT
PAD CAST 4YDX4 CTTN HI CHSV (CAST SUPPLIES) ×1 IMPLANT
PADDING CAST COTTON 4X4 STRL (CAST SUPPLIES) ×3
SPLINT PLASTER EXTRA FAST 3X15 (CAST SUPPLIES) ×2
SPLINT PLASTER GYPS XFAST 3X15 (CAST SUPPLIES) ×1 IMPLANT
SPONGE GAUZE 2X2 STER 10/PKG (GAUZE/BANDAGES/DRESSINGS) ×2
STRIP CLOSURE SKIN 1/2X4 (GAUZE/BANDAGES/DRESSINGS) ×2 IMPLANT
SUT ETHIBOND 3-0 V-5 (SUTURE) IMPLANT
SUT ETHILON 5 0 PS 2 18 (SUTURE) IMPLANT
SUT PROLENE 3 0 PS 2 (SUTURE) ×3 IMPLANT
SUT SILK 4 0 PS 2 (SUTURE) IMPLANT
SUT VIC AB 3-0 FS2 27 (SUTURE) IMPLANT
SUT VIC AB 3-0 PS2 18 (SUTURE) ×3 IMPLANT
SUT VIC AB 4-0 P-3 18X BRD (SUTURE) IMPLANT
SUT VIC AB 4-0 P3 18 (SUTURE)
TUBE CONNECTING 12'X1/4 (SUCTIONS)
TUBE CONNECTING 12X1/4 (SUCTIONS) IMPLANT
UNDERPAD 30X30 (UNDERPADS AND DIAPERS) ×3 IMPLANT

## 2017-11-27 NOTE — Progress Notes (Signed)
Pt's daughter is [place]. Her phone number is [phone]. Any questions please call her.

## 2017-11-27 NOTE — Interval H&P Note (Signed)
History and Physical Interval Note:  11/27/2017 2:06 PM  [name] [name]  has presented today for surgery, with the diagnosis of RIGHT WRIST HAND FLEXOR CONTRACTURE  The various methods of treatment have been discussed with the patient and family. After consideration of risks, benefits and other options for treatment, the patient has consented to  Procedure(s): RIGHT HAND AND WRIST DIGITAL FLEXOR TENDON TENOTOMY VERSES LENGTHENING (Right) as a surgical intervention .  The patient's history has been reviewed, patient examined, no change in status, stable for surgery.  I have reviewed the patient's chart and labs.  Questions were answered to the patient's satisfaction.     Schuyler Amor

## 2017-11-27 NOTE — Anesthesia Procedure Notes (Signed)
Procedure Name: LMA Insertion Date/Time: 11/27/2017 2:49 PM Performed by: Barrington Ellison, CRNA Pre-anesthesia Checklist: Patient identified, Emergency Drugs available, Suction available and Patient being monitored Patient Re-evaluated:Patient Re-evaluated prior to induction Oxygen Delivery Method: Circle System Utilized Preoxygenation: Pre-oxygenation with 100% oxygen Induction Type: IV induction Ventilation: Mask ventilation without difficulty LMA: LMA inserted LMA Size: 5.0 Number of attempts: 1 Placement Confirmation: positive ETCO2 Tube secured with: Tape Dental Injury: Teeth and Oropharynx as per pre-operative assessment

## 2017-11-27 NOTE — Progress Notes (Signed)
Patient underwent elbow, wrist, and digital flexor tenotomy's today.  Please maintain current dressing on the forearm until further notice.  Patient may start local wound care to digits at 48 hours postop.

## 2017-11-27 NOTE — Anesthesia Postprocedure Evaluation (Signed)
Anesthesia Post Note  Patient: Abdulhadi Stopa  Procedure(s) Performed: RIGHT HAND AND WRIST DIGITAL FLEXOR TENDON TENOTOMY VERSES LENGTHENING (Right )     Patient location during evaluation: PACU Anesthesia Type: General Level of consciousness: awake and alert, oriented and patient cooperative Pain management: pain level controlled Vital Signs Assessment: post-procedure vital signs reviewed and stable Respiratory status: spontaneous breathing, nonlabored ventilation and respiratory function stable Cardiovascular status: blood pressure returned to baseline and stable Postop Assessment: no apparent nausea or vomiting Anesthetic complications: no    Last Vitals:  Vitals:   11/27/17 1645 11/27/17 1648  BP: 129/76 120/72  Pulse: 72 76  Resp: 11 11  Temp:    SpO2: 97% 100%    Last Pain:  Vitals:   11/27/17 1645  TempSrc:   PainSc: Asleep                 Zylan Almquist,E. Sapphire Tygart

## 2017-11-27 NOTE — Transfer of Care (Signed)
Immediate Anesthesia Transfer of Care Note  Patient: [name] [name]  Procedure(s) Performed: RIGHT HAND AND WRIST DIGITAL FLEXOR TENDON TENOTOMY VERSES LENGTHENING (Right )  Patient Location: PACU  Anesthesia Type:General  Level of Consciousness: awake, alert  and oriented  Airway & Oxygen Therapy: Patient Spontanous Breathing  Post-op Assessment: Report given to RN  Post vital signs: Reviewed and stable  Last Vitals:  Vitals Value Taken Time  BP    Temp    Pulse 66 11/27/2017  4:13 PM  Resp 10 11/27/2017  4:13 PM  SpO2 98 % 11/27/2017  4:13 PM  Vitals shown include unvalidated device data.  Last Pain:  Vitals:   11/27/17 0800  TempSrc:   PainSc: 0-No pain      Patients Stated Pain Goal: 2 (73/22/56 7209)  Complications: No apparent anesthesia complications

## 2017-11-27 NOTE — Anesthesia Preprocedure Evaluation (Addendum)
Anesthesia Evaluation  Patient identified by MRN, date of birth, ID band Patient awake    Reviewed: Allergy & Precautions, NPO status , Patient's Chart, lab work & pertinent test results  Airway Mallampati: II  TM Distance: >3 FB Neck ROM: Full    Dental no notable dental hx. (+) Edentulous Upper, Edentulous Lower   Pulmonary neg pulmonary ROS,    Pulmonary exam normal breath sounds clear to auscultation       Cardiovascular hypertension, Pt. on medications negative cardio ROS Normal cardiovascular exam Rhythm:Regular Rate:Normal     Neuro/Psych Depression negative neurological ROS  negative psych ROS   GI/Hepatic negative GI ROS, Neg liver ROS,   Endo/Other  negative endocrine ROSdiabetes, Type 2, Oral Hypoglycemic Agents  Renal/GU negative Renal ROS  negative genitourinary   Musculoskeletal negative musculoskeletal ROS (+)   Abdominal   Peds negative pediatric ROS (+)  Hematology negative hematology ROS (+)   Anesthesia Other Findings Quadriplegia  Reproductive/Obstetrics negative OB ROS                            Anesthesia Physical Anesthesia Plan  ASA: IV  Anesthesia Plan: General   Post-op Pain Management:    Induction: Intravenous  PONV Risk Score and Plan: 2 and Ondansetron, Midazolam and Treatment may vary due to age or medical condition  Airway Management Planned: LMA  Additional Equipment:   Intra-op Plan:   Post-operative Plan: Extubation in OR  Informed Consent: I have reviewed the patients History and Physical, chart, labs and discussed the procedure including the risks, benefits and alternatives for the proposed anesthesia with the patient or authorized representative who has indicated his/her understanding and acceptance.   Dental advisory given  Plan Discussed with: CRNA  Anesthesia Plan Comments:         Anesthesia Quick Evaluation

## 2017-11-27 NOTE — Progress Notes (Signed)
Paged MD through service for diet order.

## 2017-11-27 NOTE — Progress Notes (Signed)
Pharmacy Antibiotic Note  [name] [name] is a 72 y.o. male admitted on 11/16/2017 with cellulitis, wound infection.  Pharmacy has been consulted for   dosing.   ID: Severe wrist and digital flexion contracture with wound issues on the right side - multiple wounds on hands and legs. Hx multiple wound infections with staph but all MSSA. No cx's this admit. Difficult stick and refuses labs sometimes. Afebrile, WBC wnl. Scr WNL - Ortho plans flexor tenotomy versus lengthening of digital wrist flexors on 11/13 with wound debridement as necessary   11/2 vancomycin >>  *Refused dose 11/3 11/3 doxycycline >> 11/4  11/7 VT  21 mcg/ml on 1250 mg IV q24hrs > decr to 1g IV q24hrs 11/11 VT 27 mcg/ml: received 1/2 dose but stopped 11/13: VT 20: hold until <15  11/2 MRSA PCR: negative  Plan: Random Vanco level in AM. Redose when level <15. Can this be d/c'd post-op? OR scheduled for 1245 on 11/13 transition to an oral antibiotic on discharge.    Height: 5' 7.5" (171.5 cm) Weight: 145 lb (65.8 kg) IBW/kg (Calculated) : 67.25  Temp (24hrs), Avg:98.6 F (37 C), Min:98.2 F (36.8 C), Max:99.2 F (37.3 C)  Recent Labs  Lab 11/21/17 1554 11/22/17 0136 11/22/17 1203 11/23/17 0729 11/24/17 0725 11/25/17 2039 11/26/17 0438 11/27/17 0225  WBC  --  6.8  --   --  4.1  --   --   --   CREATININE  --   --  0.85 0.76 0.74  --  0.99 0.89  VANCOTROUGH 21*  --   --   --   --  27*  --   --   VANCORANDOM  --   --   --   --   --   --   --  20    Estimated Creatinine Clearance: 69.8 mL/min (by C-G formula based on SCr of 0.89 mg/dL).    Allergies  Allergen Reactions  . Lisinopril Other (See Comments)    Hyperkalemia.    [name] [name] [name], PharmD, BCPS Clinical Staff Pharmacist  [name] [name] 11/27/2017 8:02 AM

## 2017-11-27 NOTE — Op Note (Signed)
Please see dictated report [phone]

## 2017-11-27 NOTE — Progress Notes (Addendum)
PROGRESS NOTE    [name] [name]  TXM:[id number] DOB: April 02, 1945 DOA: 11/16/2017 PCP: Beckie Salts, MD   Brief Narrative: 72 year old gentleman who is in very poor health with history of quadriparesis with contractures, poor compliance, diabetes mellitus, hyperlipidemia, hypertension, iron deficiency anemia, depression, multiple wounds/decubitus ulcer presented with increasing edema and drainage from the right upper extremity.  This includes to [facility] to get this wound care.  Patient was seen in the ER he was afebrile, hemodynamic stable.  He was admitted for further management.  Assessment & Plan:  Cellulitis of the right upper extremity with severe contraction of the right hand digits and right wrist with skin breakdown: Patient was initially admitted for short duration of IV antibiotics, Dr Burney Gauze orthopedic recommended transfer to [facility] for further evaluation for possible surgery. Patient had completed 8 days of IV vancomycin, orthopedic recommended another 1 day of IV antibiotics till surgery.  He is going for surgery today.  Surgery initially held due to severe hyperkalemia. K was 5.7 today status post Kayexalate.Continue as per orthopedic.  Will transition to oral antibiotic on discharge.   Hyperkalemia: Potassium level improving, 5.7 this morning, status post Kayexalate.  Repeat BMP in the morning, continue gentle IV fluids.  Anemia chronic disease.  Stable.  Monitor and transfuse to keep above 7 g.  Essential hypertension: Blood pressure controlled, continue current meds.  Wounds, multiple in lower extremities, 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: Wound care, continue wound care. Will need close follow-up with wound care as outpatient.  Paraplegia: Supportive care, position changing.  Malnutrition of moderate degree: Continue to augment nutrition.  DVT prophylaxis: Subcutaneous lovenox Code Status: Full code Family Communication: Daughter at the  bedside Disposition Plan:SNF  Consultants: Orthopedic surgery  Procedures:  tenotomy of elbow, wrist and digital flexors  Antimicrobials:  Subjective: Resting comfortably.  Is waiting for surgery today.  Denies nausea vomiting chest pain.  Pain control.  Objective: Vitals:   11/27/17 1648 11/27/17 1700 11/27/17 1702 11/27/17 1703  BP: 120/72 113/66  116/67  Pulse: 76 76 73 76  Resp: 11 12 16 16   Temp:      TempSrc:      SpO2: 100% 96% 97% 95%  Weight:      Height:        Intake/Output Summary (Last 24 hours) at 11/27/2017 1717 Last data filed at 11/27/2017 1613 Gross per 24 hour  Intake 700 ml  Output 1755 ml  Net -1055 ml   Filed Weights   11/16/17 1526 11/22/17 1240 11/27/17 1424  Weight: 65.8 kg 65.8 kg 65.7 kg   Weight change:   Body mass index is 22.35 kg/m.  Examination:  General exam: Calm, comfortable not in acute distress, average built.  HEENT:Oral mucosa moist, Ear/Nose WNL grossly Respiratory system: Bilateral equal air entry, no crackles and wheezing Cardiovascular system: S1 & S2 heard, No JVD/murmurs.No pedal edema. Gastrointestinal system: Abdomen soft, nontender non-distended, BS +. Nervous System:Alert/awake/oriented at baseline.  Paraplegic  Extremities: No edema,distal peripheral pulses palpable.  Right hand in dressing. Skin: No rashes,no icterus.   Pressure ulcers present on the leg, back MSK: Normal muscle bulk,tone ,power  Data Reviewed: I have personally reviewed following labs and imaging studies  CBC: Recent Labs  Lab 11/22/17 0136 11/22/17 1333 11/24/17 0725  WBC 6.8  --  4.1  HGB 9.0* 9.2* 8.5*  HCT 28.3* 27.0* 26.7*  MCV 95.6  --  94.7  PLT 313  --  482   Basic Metabolic Panel:  Recent Labs  Lab 11/22/17 1203 11/22/17 1333 11/23/17 0729 11/24/17 0725 11/26/17 0438 11/26/17 2108 11/27/17 0225  NA 135 136 136 135 134*  --  134*  K 6.4* 6.1* 5.1 5.0 5.9* 6.0* 5.7*  CL 106  --  105 104 105  --  106  CO2 24  --  28  25 22   --  22  GLUCOSE 99 96 107* 108* 100*  --  124*  BUN 38*  --  31* 38* 53*  --  53*  CREATININE 0.85  --  0.76 0.74 0.99  --  0.89  CALCIUM 8.8*  --  9.0 8.7* 8.6*  --  8.7*   GFR: Estimated Creatinine Clearance: 69.7 mL/min (by C-G formula based on SCr of 0.89 mg/dL). Liver Function Tests: No results for input(s): AST, ALT, ALKPHOS, BILITOT, PROT, ALBUMIN in the last 168 hours. No results for input(s): LIPASE, AMYLASE in the last 168 hours. No results for input(s): AMMONIA in the last 168 hours. Coagulation Profile: No results for input(s): INR, PROTIME in the last 168 hours. Cardiac Enzymes: No results for input(s): CKTOTAL, CKMB, CKMBINDEX, TROPONINI in the last 168 hours. BNP (last 3 results) No results for input(s): PROBNP in the last 8760 hours. HbA1C: No results for input(s): HGBA1C in the last 72 hours. CBG: Recent Labs  Lab 11/26/17 2157 11/27/17 0643 11/27/17 1108 11/27/17 1413 11/27/17 1615  GLUCAP 148* 100* 98 89 87   Lipid Profile: No results for input(s): CHOL, HDL, LDLCALC, TRIG, CHOLHDL, LDLDIRECT in the last 72 hours. Thyroid Function Tests: No results for input(s): TSH, T4TOTAL, FREET4, T3FREE, THYROIDAB in the last 72 hours. Anemia Panel: No results for input(s): VITAMINB12, FOLATE, FERRITIN, TIBC, IRON, RETICCTPCT in the last 72 hours. Sepsis Labs: No results for input(s): PROCALCITON, LATICACIDVEN in the last 168 hours.  No results found for this or any previous visit (from the past 240 hour(s)).    Radiology Studies: No results found.  Scheduled Meds: . [MAR Hold] amLODipine  5 mg Oral Daily  . [MAR Hold] collagenase   Topical Daily  . [MAR Hold] enoxaparin (LOVENOX) injection  40 mg Subcutaneous Q24H  . [MAR Hold] Influenza vac split quadrivalent PF  0.5 mL Intramuscular Tomorrow-1000  . [MAR Hold] insulin aspart  0-9 Units Subcutaneous TID WC  . [MAR Hold] multivitamin with minerals  1 tablet Oral Daily  . [MAR Hold] nutrition  supplement (JUVEN)  1 packet Oral BID BM  . [MAR Hold] sodium chloride flush  3 mL Intravenous Q12H  . [MAR Hold] vancomycin variable dose per unstable renal function (pharmacist dosing)   Does not apply See admin instructions   Continuous Infusions: . [MAR Hold] sodium chloride 250 mL (11/16/17 2224)  . sodium chloride 75 mL/hr at 11/27/17 1214  . lactated ringers 10 mL/hr at 11/27/17 1426     LOS: 11 days   Time spent: More than 50% of that time was spent in counseling and/or coordination of care.  Antonieta Pert, MD [facility] Pager [phone]  If 7PM-7AM, please contact night-coverage [email or web address] Password [facility] 11/27/2017, 5:17 PM   Please Note:This patient record was dictated using Editor, commissioning. Chart creation errors have been sought, but may not always have been located. Such creation errors do not reflect on the Standard of Medical Care.

## 2017-11-28 ENCOUNTER — Inpatient Hospital Stay (HOSPITAL_COMMUNITY): Payer: Medicare Other

## 2017-11-28 ENCOUNTER — Encounter (HOSPITAL_COMMUNITY): Payer: Self-pay | Admitting: Orthopedic Surgery

## 2017-11-28 LAB — URINALYSIS, ROUTINE W REFLEX MICROSCOPIC
BILIRUBIN URINE: NEGATIVE
Glucose, UA: 50 mg/dL — AB
Ketones, ur: NEGATIVE mg/dL
NITRITE: NEGATIVE
Protein, ur: NEGATIVE mg/dL
SPECIFIC GRAVITY, URINE: 1.011 (ref 1.005–1.030)
pH: 6 (ref 5.0–8.0)

## 2017-11-28 LAB — RESPIRATORY PANEL BY PCR
Adenovirus: NOT DETECTED
BORDETELLA PERTUSSIS-RVPCR: NOT DETECTED
CHLAMYDOPHILA PNEUMONIAE-RVPPCR: NOT DETECTED
CORONAVIRUS 229E-RVPPCR: NOT DETECTED
CORONAVIRUS OC43-RVPPCR: NOT DETECTED
Coronavirus HKU1: NOT DETECTED
Coronavirus NL63: NOT DETECTED
INFLUENZA B-RVPPCR: NOT DETECTED
Influenza A: NOT DETECTED
MYCOPLASMA PNEUMONIAE-RVPPCR: NOT DETECTED
Metapneumovirus: NOT DETECTED
PARAINFLUENZA VIRUS 1-RVPPCR: NOT DETECTED
PARAINFLUENZA VIRUS 4-RVPPCR: NOT DETECTED
Parainfluenza Virus 2: NOT DETECTED
Parainfluenza Virus 3: NOT DETECTED
Respiratory Syncytial Virus: NOT DETECTED
Rhinovirus / Enterovirus: NOT DETECTED

## 2017-11-28 LAB — CBC
HCT: 27.1 % — ABNORMAL LOW (ref 39.0–52.0)
HEMOGLOBIN: 8.6 g/dL — AB (ref 13.0–17.0)
MCH: 30.5 pg (ref 26.0–34.0)
MCHC: 31.7 g/dL (ref 30.0–36.0)
MCV: 96.1 fL (ref 80.0–100.0)
NRBC: 0 % (ref 0.0–0.2)
PLATELETS: 283 10*3/uL (ref 150–400)
RBC: 2.82 MIL/uL — AB (ref 4.22–5.81)
RDW: 14.9 % (ref 11.5–15.5)
WBC: 11.1 10*3/uL — ABNORMAL HIGH (ref 4.0–10.5)

## 2017-11-28 LAB — BASIC METABOLIC PANEL
ANION GAP: 8 (ref 5–15)
BUN: 33 mg/dL — AB (ref 8–23)
CHLORIDE: 103 mmol/L (ref 98–111)
CO2: 23 mmol/L (ref 22–32)
Calcium: 8.4 mg/dL — ABNORMAL LOW (ref 8.9–10.3)
Creatinine, Ser: 0.77 mg/dL (ref 0.61–1.24)
GFR calc Af Amer: >60 mL/min (ref >60)
Glucose, Bld: 212 mg/dL — ABNORMAL HIGH (ref 70–99)
POTASSIUM: 5 mmol/L (ref 3.5–5.1)
SODIUM: 134 mmol/L — AB (ref 135–145)

## 2017-11-28 LAB — VANCOMYCIN, RANDOM: Vancomycin Rm: 13

## 2017-11-28 LAB — INFLUENZA PANEL BY PCR (TYPE A & B)
INFLAPCR: NEGATIVE
Influenza B By PCR: NEGATIVE

## 2017-11-28 LAB — LACTIC ACID, PLASMA
LACTIC ACID, VENOUS: 2.5 mmol/L — AB (ref 0.5–1.9)
LACTIC ACID, VENOUS: 2 mmol/L — AB (ref 0.5–1.9)
Lactic Acid, Venous: 0.7 mmol/L (ref 0.5–1.9)

## 2017-11-28 LAB — GLUCOSE, CAPILLARY
GLUCOSE-CAPILLARY: 153 mg/dL — AB (ref 70–99)
GLUCOSE-CAPILLARY: 187 mg/dL — AB (ref 70–99)
GLUCOSE-CAPILLARY: 155 mg/dL — AB (ref 70–99)
Glucose-Capillary: 196 mg/dL — ABNORMAL HIGH (ref 70–99)

## 2017-11-28 LAB — PROCALCITONIN: Procalcitonin: <0.1 ng/mL

## 2017-11-28 IMAGING — DX DG CHEST 1V PORT
1 series · 1 of 1 positions shown · non-contrast
Comparison: None.

CLINICAL DATA: Fever

EXAM:
PORTABLE CHEST 1 VIEW

[chest]
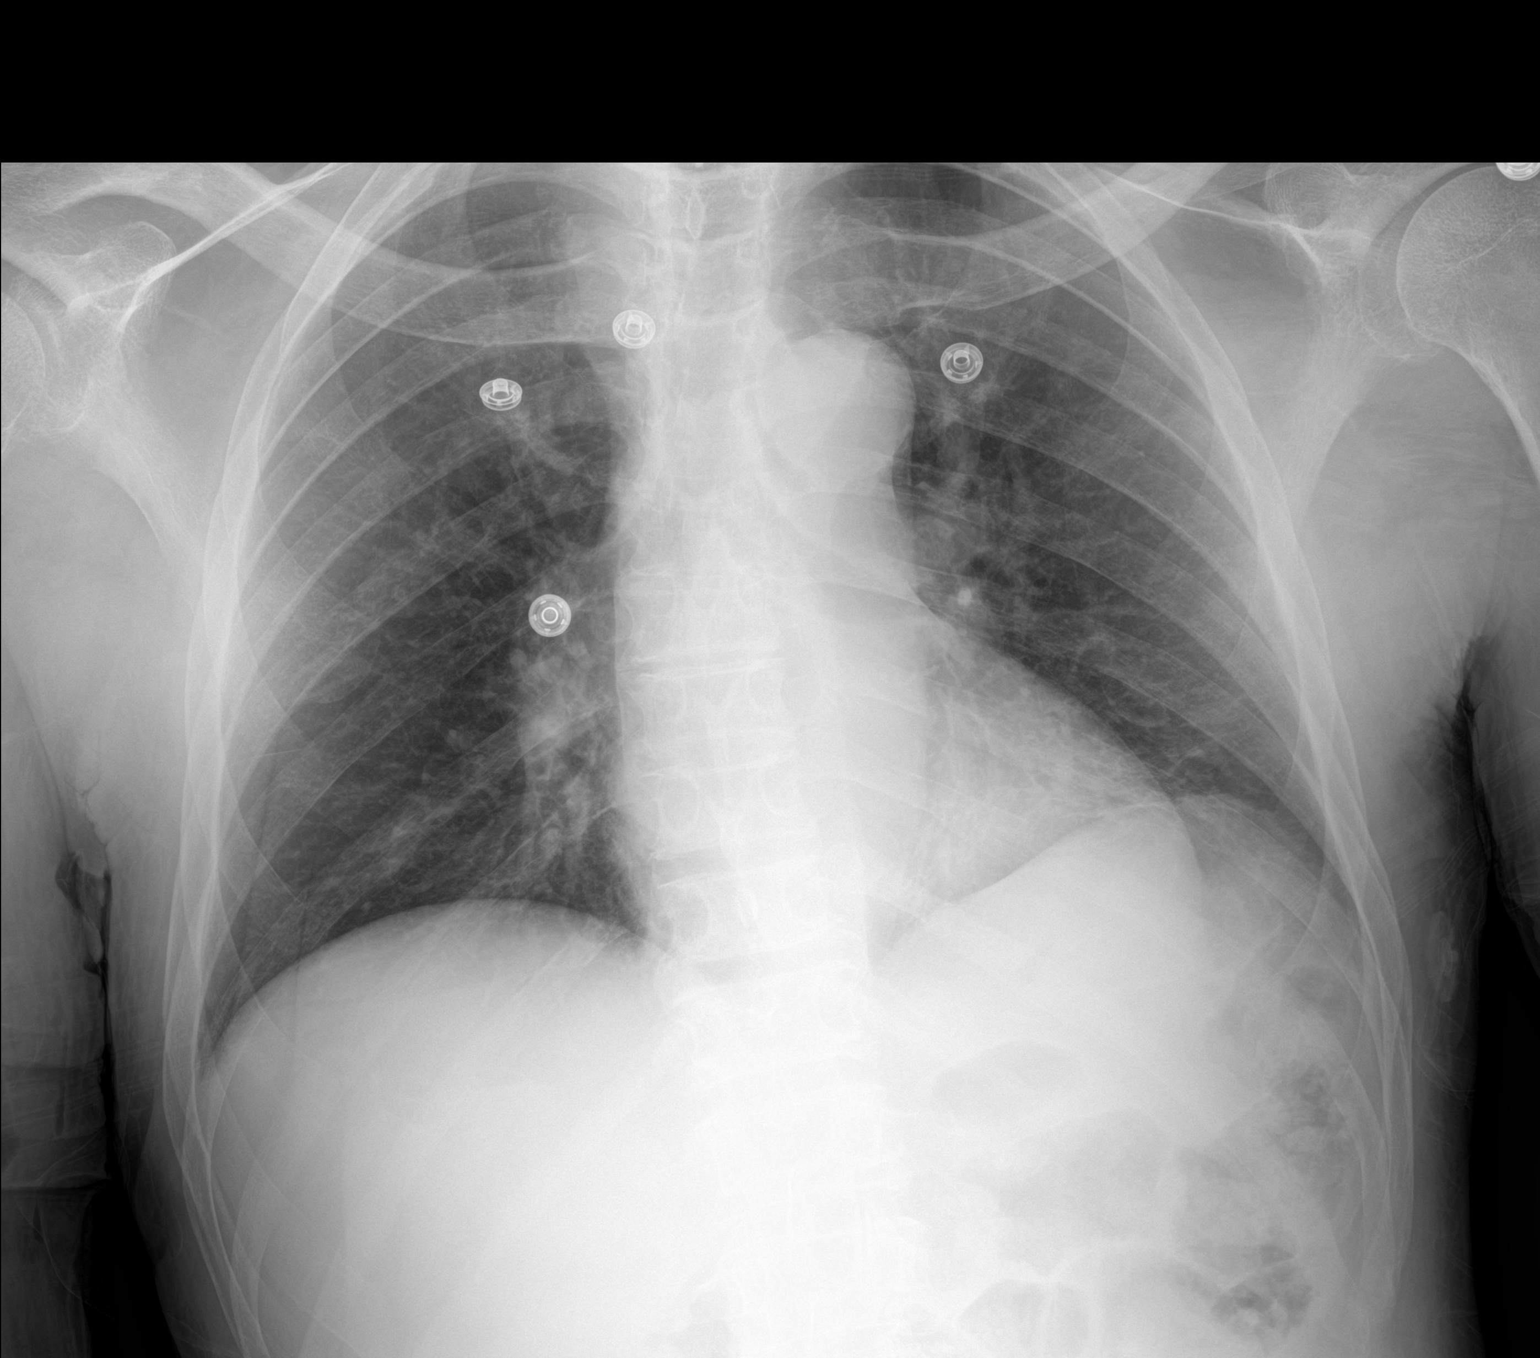

[1 of 1 positions shown; findings below may reference images not displayed]

FINDINGS: No prior chest x-rays available for comparison. There is opacity
medially at the left lung base overlying the heart shadow which may
represent a patchy focus of pneumonia. Follow-up chest x-ray is
recommended to ensure complete clearing after interval treatment.
The right lung is clear. No pleural effusion is seen. Mediastinal
and hilar contours are unremarkable and mild cardiomegaly is
present. No acute bony abnormality is seen.
IMPRESSION: 1. Parenchymal opacity medially at the left lung base suspicious for
a focus of pneumonia. Recommend follow-up chest x-ray.
2. Mild cardiomegaly.

## 2017-11-28 MED ORDER — SODIUM CHLORIDE 0.9 % IV BOLUS
800.0000 mL | Freq: Once | INTRAVENOUS | Status: AC
  Administered 2017-11-28: 800 mL via INTRAVENOUS

## 2017-11-28 MED ORDER — SODIUM CHLORIDE 0.9 % IV BOLUS
1000.0000 mL | Freq: Once | INTRAVENOUS | Status: AC
  Administered 2017-11-28: 1000 mL via INTRAVENOUS

## 2017-11-28 MED ORDER — PIPERACILLIN-TAZOBACTAM 3.375 G IVPB
3.3750 g | Freq: Three times a day (TID) | INTRAVENOUS | Status: DC
  Filled 2017-11-28 (×2): qty 50

## 2017-11-28 MED ORDER — VANCOMYCIN HCL IN DEXTROSE 1-5 GM/200ML-% IV SOLN
1000.0000 mg | INTRAVENOUS | Status: DC
  Administered 2017-11-28: 1000 mg via INTRAVENOUS
  Filled 2017-11-28: qty 200

## 2017-11-28 MED ORDER — PIPERACILLIN-TAZOBACTAM 3.375 G IVPB
3.3750 g | Freq: Three times a day (TID) | INTRAVENOUS | Status: DC
  Administered 2017-11-28 – 2017-11-30 (×7): 3.375 g via INTRAVENOUS
  Filled 2017-11-28 (×8): qty 50

## 2017-11-28 NOTE — Progress Notes (Signed)
PROGRESS NOTE    [name] [name]  SAY:[id number] DOB: 1945/04/23 DOA: 11/16/2017 PCP: Beckie Salts, MD   Brief Narrative: 72 year old gentleman who is in very poor health with history of quadriparesis with contractures, poor compliance, diabetes mellitus, hyperlipidemia, hypertension, iron deficiency anemia, depression, multiple wounds/decubitus ulcer presented with increasing edema and drainage from the right upper extremity.  This includes to [facility] to get this wound care.  Patient was seen in the ER he was afebrile, hemodynamic stable.  He was admitted for further management.  Patient was treated with IV vancomycin  Assessment & Plan:  Cellulitis of the right upper extremity with severe contraction of the right hand digits and right wrist with skin breakdown: Patient was initially admitted for IV antibiotics, Dr Burney Gauze orthopedic recommended transfer to [facility] for further evaluation for possible surgery. Patient had completed 8 days of IV vancomycin, orthopedic recommended another 1 day of IV antibiotics till surgery. Surgery initially held due to severe hyperkalemia. On 11/13 Pt underwent wrist, distal flexor tenotomy, arthrogram is to start working on local wound care 48 hours postop.  Patient remains on vancomycin IV.  No culture data available from OR.  Overnight mild leukocytosis 11100 post op bu no fever.  Essential hypertension: Blood pressure intermittently soft. I will hold amlodipine, cotn iv nss and monitor closely.  Hypotension with fever, possible sepsis:On vancomycin already, broaden and added Zosyn, ordered  blood cultures, procalcitonin, lactic acid- taht came up at 2.5- we will order serial lactate q 3 hr x2. Ordered  UA, chest x-ray, flu scree/Resp viral panel.  BP improved to  127/73 after bolus 1 L- we ill give additional 800 ml. Pt was examined, not in distress and resting well, no complaints besides hand pain, skin perfused. Reassess for additional fluids after repeat  lactate. Cont on iv nss  Hyperkalemia: Potassium level improved to 5.0. Changed to low potassium diet.  Anemia chronic disease.  Stable.  Monitor and transfuse to keep above 7 g.  Wounds, multiple in lower extremities, Stage II pressure ulcer of sacral region n POA/bilateral pretibial and posterior LE venous stasis wounds POA: cont wound care, off loading, ace wrap,  Will need close follow-up with wound care as outpatient.  Paraplegia: Supportive care, position changing.  Malnutrition of moderate degree: Continue to augment nutrition.  DVT prophylaxis: [place] Lovenox. Code Status: Full code. Family Communication:no family at the bedside. I called his daughter and updated. She is aware abt clinical changes. Pt is beign monitored closely. Disposition Plan:SNF, 2 days once okay w Orthopedics.  Consultants: Orthopedic surgery  Procedures:  tenotomy of elbow, wrist and digital flexors  Antimicrobials:Vancomycin  Subjective: No new complaints overnight.  Afebrile.  Pain is controlled.  Denies nausea, vomiting, chest pain or shortness of breath.  Objective: Vitals:   11/28/17 0044 11/28/17 0523 11/28/17 1000 11/28/17 1414  BP: 101/69 93/63 136/81 (!) 89/63  Pulse: (!) 103 92 98 (!) 111  Resp: 20 18  20   Temp: 98.5 F (36.9 C) 98.1 F (36.7 C)    TempSrc: Oral Oral    SpO2: 99% 98%  97%  Weight:      Height:        Intake/Output Summary (Last 24 hours) at 11/28/2017 1421 Last data filed at 11/28/2017 0840 Gross per 24 hour  Intake 2171.49 ml  Output 805 ml  Net 1366.49 ml   Filed Weights   11/16/17 1526 11/22/17 1240 11/27/17 1424  Weight: 65.8 kg 65.8 kg 65.7 kg   Weight change:  Body mass index is 22.35 kg/m.  Examination:  General exam: Calm, comfortable not in acute distress, chronically sick looking, thin built HEENT:Oral mucosa moist, Ear/Nose WNL grossly Respiratory system: Bilateral equal air entry, no crackles and wheezing Cardiovascular system: S1 & S2  heard, No JVD/murmurs.No pedal edema. Gastrointestinal system: Abdomen soft, nontender non-distended, BS +. Nervous System:Alert/awake/oriented at baseline.  Able to move UE and LE Extremities: b/l mild edema,distal peripheral pulses palpable. Skin: No rashes,no icterus.  Chronic ulcers present on the back, leg with dressing intact.  Data Reviewed: I have personally reviewed following labs and imaging studies  CBC: Recent Labs  Lab 11/22/17 0136 11/22/17 1333 11/24/17 0725 11/28/17 0201  WBC 6.8  --  4.1 11.1*  HGB 9.0* 9.2* 8.5* 8.6*  HCT 28.3* 27.0* 26.7* 27.1*  MCV 95.6  --  94.7 96.1  PLT 313  --  296 353   Basic Metabolic Panel:  Recent Labs  Lab 11/23/17 0729 11/24/17 0725 11/26/17 0438 11/26/17 2108 11/27/17 0225 11/28/17 0201  NA 136 135 134*  --  134* 134*  K 5.1 5.0 5.9* 6.0* 5.7* 5.0  CL 105 104 105  --  106 103  CO2 28 25 22   --  22 23  GLUCOSE 107* 108* 100*  --  124* 212*  BUN 31* 38* 53*  --  53* 33*  CREATININE 0.76 0.74 0.99  --  0.89 0.77  CALCIUM 9.0 8.7* 8.6*  --  8.7* 8.4*   GFR: Estimated Creatinine Clearance: 77.6 mL/min (by C-G formula based on SCr of 0.77 mg/dL). Liver Function Tests: No results for input(s): AST, ALT, ALKPHOS, BILITOT, PROT, ALBUMIN in the last 168 hours. No results for input(s): LIPASE, AMYLASE in the last 168 hours. No results for input(s): AMMONIA in the last 168 hours. Coagulation Profile: No results for input(s): INR, PROTIME in the last 168 hours. Cardiac Enzymes: No results for input(s): CKTOTAL, CKMB, CKMBINDEX, TROPONINI in the last 168 hours. BNP (last 3 results) No results for input(s): PROBNP in the last 8760 hours. HbA1C: No results for input(s): HGBA1C in the last 72 hours. CBG: Recent Labs  Lab 11/27/17 1413 11/27/17 1615 11/27/17 2203 11/28/17 0655 11/28/17 1150  GLUCAP 89 87 158* 153* 196*   Lipid Profile: No results for input(s): CHOL, HDL, LDLCALC, TRIG, CHOLHDL, LDLDIRECT in the last 72  hours. Thyroid Function Tests: No results for input(s): TSH, T4TOTAL, FREET4, T3FREE, THYROIDAB in the last 72 hours. Anemia Panel: No results for input(s): VITAMINB12, FOLATE, FERRITIN, TIBC, IRON, RETICCTPCT in the last 72 hours. Sepsis Labs: No results for input(s): PROCALCITON, LATICACIDVEN in the last 168 hours.  No results found for this or any previous visit (from the past 240 hour(s)).    Radiology Studies: No results found.  Scheduled Meds: . amLODipine  5 mg Oral Daily  . collagenase   Topical Daily  . enoxaparin (LOVENOX) injection  40 mg Subcutaneous Q24H  . Influenza vac split quadrivalent PF  0.5 mL Intramuscular Tomorrow-1000  . insulin aspart  0-9 Units Subcutaneous TID WC  . multivitamin with minerals  1 tablet Oral Daily  . nutrition supplement (JUVEN)  1 packet Oral BID BM  . sodium chloride flush  3 mL Intravenous Q12H  . vancomycin variable dose per unstable renal function (pharmacist dosing)   Does not apply See admin instructions   Continuous Infusions: . sodium chloride 250 mL (11/16/17 2224)  . sodium chloride 75 mL/hr at 11/28/17 0500  . lactated ringers 10 mL/hr at 11/27/17  1426  . vancomycin 1,000 mg (11/28/17 1021)     LOS: 12 days   Time spent: More than 50% of that time was spent in counseling and/or coordination of care.  Antonieta Pert, MD [facility] Pager [phone]  If 7PM-7AM, please contact night-coverage [email or web address] Password [email or web address] 11/28/2017, 2:21 PM

## 2017-11-28 NOTE — Op Note (Signed)
NAMEJAKAI, RISSE MEDICAL RECORD [id number] ACCOUNT [id number] DATE OF BIRTH:1945-12-22 FACILITY: [facility] LOCATION: [facility] PHYSICIAN:Fowler Antos A. Burney Gauze, MD  OPERATIVE REPORT  DATE OF PROCEDURE:  11/27/2017  PREOPERATIVE DIAGNOSIS:  Severe right upper extremity contractures involving elbow, wrist and digits.  POSTOPERATIVE DIAGNOSIS:  Severe right upper extremity contractures involving elbow, wrist and digits.  PROCEDURE:  Flexor tenotomy of the digital flexors, wrist flexors, and elbow flexors, right upper extremity.  SURGEON:  Charlotte Crumb, MD  ASSISTANT:  Dasnoit, PA.  ANESTHESIA:  General.  COMPLICATIONS:  No complications.  DRAINS:  No drains.  DESCRIPTION OF PROCEDURE:  The patient was taken to the operating suite after the induction of adequate general anesthetic.  The right upper extremity was prepped and draped in the usual sterile fashion.  An Esmarch was used to exsanguinate the limb and  the tourniquet was inflated to 250 mmHg.  At this point in time, incision was made in the mid to distal third of the forearm midline.  Dissection was carried down to the wrist flexors, digital flexors, and thumb flexors.  These were all tenotomized using  a tenotomy scissor.  This wound was irrigated and loosely closed with 3-0 Vicryl and 3-0 Prolene subcuticular stitch.  A second incision was made in the area just proximal to the antecubital fossa on the elbow on the right transversely.  Dissection was  carried down to the level of the brachioradialis and biceps muscle, which were carefully released using tenotomy scissors.  We gently manipulated the elbow into a more extended position.  This wound was thoroughly irrigated and loosely closed with the  same suture.  We then dressed sterilely with 4 x 4 fluffs, Steri-Strips and a compression wrap.  We then gently manipulated the digits and wrist into extension and placed a palmar plaster splint.  The patient tolerated these procedures  well and went to  recovery room in stable fashion.  TN/NUANCE  D:11/27/2017 T:11/28/2017 JOB:[id number]

## 2017-11-28 NOTE — Progress Notes (Signed)
Pharmacy Antibiotic Note  [name] [name] is a 72 y.o. male admitted on 11/16/2017 with wound infection. Severe wrist and digital flexion contracture with wound issues on the right side - multiple wounds on hands and legs. Currently on IV Vancomycin.  Pharmacy has been consulted to add Zosyn to therapy for possible sepsis. SCr 0.77,  CrCl is ~ 77 ml/min  Plan: Zosyn 3.375 g IV q8 hour (4 hour infusion) contine Vancomycin as previously ordered.  Height: 5' 7.5" (171.5 cm) Weight: 144 lb 13.5 oz (65.7 kg) IBW/kg (Calculated) : 67.25  Temp (24hrs), Avg:99 F (37.2 C), Min:97.9 F (36.6 C), Max:102 F (38.9 C)  Recent Labs  Lab 11/22/17 0136  11/23/17 0729 11/24/17 0725 11/25/17 2039 11/26/17 0438 11/27/17 0225 11/28/17 0201  WBC 6.8  --   --  4.1  --   --   --  11.1*  CREATININE  --    < > 0.76 0.74  --  0.99 0.89 0.77  VANCOTROUGH  --   --   --   --  27*  --   --   --   VANCORANDOM  --   --   --   --   --   --  20 13   < > = values in this interval not displayed.    Estimated Creatinine Clearance: 77.6 mL/min (by C-G formula based on SCr of 0.77 mg/dL).    Allergies  Allergen Reactions  . Lisinopril Other (See Comments)    Hyperkalemia.    Antimicrobials this admission: 11/2 vancomycin >>  *Refused dose 11/3 11/3 doxycycline >> 11/4  Vanc troughs/Dose adjustments this admission: 11/7 VT  21 mcg/ml on 1250 mg IV q24hrs > decr to 1g IV q24hrs 11/11 VT 27 mcg/ml: received 1/2 dose but stopped 11/13: VR 20: hold until <15 11/14: VR: 13   Microbiology results: 11/2 MRSA PCR: negative Thank you for allowing pharmacy to be a part of this patient's care.  Nicole Cella, RPh Clinical Pharmacist Please check AMION for all [facility] [place] phone numbers After 10:00 PM, call [facility] [phone] 11/28/2017 3:55 PM

## 2017-11-28 NOTE — Progress Notes (Signed)
Pharmacy Antibiotic Note  [name] [name] is a 72 y.o. male admitted on 11/16/2017 with wound infection.  Pharmacy has been consulted for Vancomycin dosing.  ID: Severe wrist and digital flexion contracture with wound issues on the right side - multiple wounds on hands and legs. Hx multiple wound infections with staph but all MSSA. No cx's this admit. Difficult stick and refuses labs sometimes. Afebrile, WBC 4.1>11.1. Scr WNL - Last full dose Vanco 11/10 with 1/2 dose infused 11/11 - 11/13: Flexor tenotomy versus lengthening of digital wrist flexors with wound debridement as necessary   11/2 vancomycin >>  *Refused dose 11/3 11/3 doxycycline >> 11/4  11/7 VT  21 mcg/ml on 1250 mg IV q24hrs > decr to 1g IV q24hrs 11/11 VT 27 mcg/ml: received 1/2 dose but stopped 11/13: VR 20: hold until <15 11/14: VR: 13  11/2 MRSA PCR: negative  Plan: Resumed Vancomycin 1g IV q 48hrs. transition to an oral antibiotic on discharge.    Height: 5' 7.5" (171.5 cm) Weight: 144 lb 13.5 oz (65.7 kg) IBW/kg (Calculated) : 67.25  Temp (24hrs), Avg:98.2 F (36.8 C), Min:97.9 F (36.6 C), Max:98.5 F (36.9 C)  Recent Labs  Lab 11/21/17 1554 11/22/17 0136  11/23/17 0729 11/24/17 0725 11/25/17 2039 11/26/17 0438 11/27/17 0225 11/28/17 0201  WBC  --  6.8  --   --  4.1  --   --   --  11.1*  CREATININE  --   --    < > 0.76 0.74  --  0.99 0.89 0.77  VANCOTROUGH 21*  --   --   --   --  27*  --   --   --   VANCORANDOM  --   --   --   --   --   --   --  20 13   < > = values in this interval not displayed.    Estimated Creatinine Clearance: 77.6 mL/min (by C-G formula based on SCr of 0.77 mg/dL).    Allergies  Allergen Reactions  . Lisinopril Other (See Comments)    Hyperkalemia.   [name] [name] [name], PharmD, BCPS Clinical Staff Pharmacist Eilene Ghazi [facility] 11/28/2017 8:32 AM

## 2017-11-29 LAB — BASIC METABOLIC PANEL
Anion gap: 6 (ref 5–15)
BUN: 29 mg/dL — ABNORMAL HIGH (ref 8–23)
CHLORIDE: 107 mmol/L (ref 98–111)
CO2: 22 mmol/L (ref 22–32)
Calcium: 8.1 mg/dL — ABNORMAL LOW (ref 8.9–10.3)
Creatinine, Ser: 0.94 mg/dL (ref 0.61–1.24)
GFR calc non Af Amer: >60 mL/min (ref >60)
Glucose, Bld: 162 mg/dL — ABNORMAL HIGH (ref 70–99)
POTASSIUM: 5.1 mmol/L (ref 3.5–5.1)
SODIUM: 135 mmol/L (ref 135–145)

## 2017-11-29 LAB — CBC
HCT: 24.6 % — ABNORMAL LOW (ref 39.0–52.0)
Hemoglobin: 7.8 g/dL — ABNORMAL LOW (ref 13.0–17.0)
MCH: 30.6 pg (ref 26.0–34.0)
MCHC: 31.7 g/dL (ref 30.0–36.0)
MCV: 96.5 fL (ref 80.0–100.0)
Platelets: 227 10*3/uL (ref 150–400)
RBC: 2.55 MIL/uL — AB (ref 4.22–5.81)
RDW: 15.1 % (ref 11.5–15.5)
WBC: 8 10*3/uL (ref 4.0–10.5)
nRBC: 0 % (ref 0.0–0.2)

## 2017-11-29 LAB — GLUCOSE, CAPILLARY
GLUCOSE-CAPILLARY: 99 mg/dL (ref 70–99)
GLUCOSE-CAPILLARY: 162 mg/dL — AB (ref 70–99)
GLUCOSE-CAPILLARY: 125 mg/dL — AB (ref 70–99)
GLUCOSE-CAPILLARY: 133 mg/dL — AB (ref 70–99)

## 2017-11-29 LAB — URINE CULTURE: Culture: <10000 — AB

## 2017-11-29 LAB — PROCALCITONIN: Procalcitonin: 0.14 ng/mL

## 2017-11-29 NOTE — Progress Notes (Signed)
Occupational Therapy Treatment Patient Details Name: [name] [name] MRN: [id number] DOB: Mar 19, 1945 Today's Date: 11/29/2017    History of present illness 72 year old male with a history of quadriparesis with a history of contractures, poor compliance, diabetes mellitus type 2, hyperlipidemia, hypertension, iron deficiency, depression, decubitus ulcers presenting with increasing edema and drainage from the right upper extremity.  On 11/13.Patient underwent flexor tenotomy of the distal flexors, wrist flexors and elbow flexion/right upper extremity.   OT comments  Pt continues to be self-limiting throughout therapy. OT was able to get Pt to allow one pillow under RUE for elevation and edema management. He declined options of positioning and set up for feeding either with adaptive utensils or regular utensils. He stated 'that's why I like it here, yall do it for me" He was total A for feeding this session. Pt left in bed with alarm on at the end of session, and despite education to have [facility] elevated he insisted his head be lowered.    Follow Up Recommendations  SNF;Home health OT;Supervision/Assistance - 24 hour    Equipment Recommendations  None recommended by OT    Recommendations for Other Services      Precautions / Restrictions Precautions Precaution Comments: L UE contractures and wounds       Mobility Bed Mobility               General bed mobility comments: NT this session  Transfers                      Balance                                           ADL either performed or assessed with clinical judgement   ADL Overall ADL's : Needs assistance/impaired Eating/Feeding: Total assistance;Bed level Eating/Feeding Details (indicate cue type and reason): Pt very self-limiting althogh he reports that he feeds himself at home. OT attempted to set him up "like at home" and he became agitated..."thats one of the things I like about being here,  you feed me" Grooming: Wash/dry face;Cueing for compensatory techniques Grooming Details (indicate cue type and reason): reviewed compensatory tech. to use L hand to wash face with washcloth. pt. declined return demo but verbalized understanding                                     Vision       Perception     Praxis      Cognition Arousal/Alertness: Awake/alert Behavior During Therapy: WFL for tasks assessed/performed Overall Cognitive Status: Within Functional Limits for tasks assessed                                          Exercises     Shoulder Instructions       General Comments The patient did allow me to put one pillow under his RUE for slight elevation and edema management    Pertinent Vitals/ Pain       Pain Assessment: 0-10 Pain Score: 8  Pain Location: with any movement of RUE Pain Descriptors / Indicators: Grimacing;Sharp;Moaning;Tightness Pain Intervention(s): Monitored during session;Limited activity within patient's tolerance;Premedicated before session  Home Living  Prior Functioning/Environment              Frequency  Min 1X/week        Progress Toward Goals  OT Goals(current goals can now be found in the care plan section)  Progress towards OT goals: Not progressing toward goals - comment(self-limiting participation)  Acute Rehab OT Goals Patient Stated Goal: go home to my daugthers OT Goal Formulation: With patient Time For Goal Achievement: 12/13/17 Potential to Achieve Goals: Fair ADL Goals Pt Will Perform Eating: with set-up;bed level Pt Will Perform Grooming: with set-up;bed level  Plan Discharge plan remains appropriate;Frequency needs to be updated    Co-evaluation                 AM-PAC PT "6 Clicks" Daily Activity     Outcome Measure   Help from another person eating meals?: Total Help from another person taking care of  personal grooming?: Total Help from another person toileting, which includes using toliet, bedpan, or urinal?: Total Help from another person bathing (including washing, rinsing, drying)?: Total Help from another person to put on and taking off regular upper body clothing?: Total Help from another person to put on and taking off regular lower body clothing?: Total 6 Click Score: 6    End of Session    OT Visit Diagnosis: Muscle weakness (generalized) (M62.81)   Activity Tolerance Patient limited by pain;Other (comment)(self-limiting)   Patient Left in bed;with bed alarm set   Nurse Communication          Time: 1630-1701 OT Time Calculation (min): 31 min  Charges: OT General Charges $OT Visit: 1 Visit OT Treatments $Self Care/Home Management : 23-37 mins Hulda Humphrey OTR/L Acute Rehabilitation Services Pager: [phone] Office: [place] 11/29/2017, 5:54 PM

## 2017-11-29 NOTE — Progress Notes (Signed)
Finger dressings changed at bedside  Will need daily wound care to fingers only  Will need to see in my office next week for formal dressing changes

## 2017-11-29 NOTE — Progress Notes (Signed)
PROGRESS NOTE    [name] [name]  DDU:[id number] DOB: 1945-11-16 DOA: 11/16/2017 PCP: Beckie Salts, MD   Brief Narrative: 72 year old gentleman who is in very poor health with history of quadriparesis with contractures, poor compliance, diabetes mellitus, hyperlipidemia, hypertension, iron deficiency anemia, depression, multiple wounds/decubitus ulcer presented with increasing edema and drainage from the right upper extremity.  This includes to [facility] to get this wound care.  Patient was seen in the ER he was afebrile, hemodynamically stable.He was admitted for further management. Seen by hand surgery and .   Assessment & Plan:  Cellulitis of the right upper extremity with severe contraction of the right hand digits and right wrist with skin breakdown:Patient was initially admitted for IV antibiotics, Dr Burney Gauze orthopedic recommended transfer to [facility] for further evaluation for possible surgery. Patient had completed 8 days of IV vancomycin, orthopedic recommended another day of IV antibiotics till surgery. Surgery initially held due to severe hyperkalemia. On 11/13.Patient underwent flexor tenotomy of the distal flexors, wrist flexors and elbow flexion/right upper extremity.To start on local wound care 48 hours postop today. Spoke w hand surgery team/PA w Dr Ardelle Anton gross infection was noted in surgery and hand surgery team okay to stop vancomycin.   Fever episode w hypotension in 80s (11/14 afternoon), sepsis, treated with bolus normal saline, antibiotics escalated with adding Zosyn. Sepsis resolved, work-up revealed left lobe base pneumonia, bacterial, will treat for hospital-acquired pneumonia. Influenza screen negative, urinalysis with possible UTI urine culture sent, blood culture pending.  Procalcitonin is negative, now no more fever episode and vitals are stable. Cont Zosyn and can deescalate abx soon.  Essential hypertension: stable BP now. Cont to hold  amlodipine  Hyperkalemia: Potassium level improved to 5.1. Changed to low potassium diet.  Anemia chronic disease. Monitor and transfuse to keep above 7 g. Hb at 7.8 gm.  Wounds, multiple in lower extremities, Stage II pressure ulcer of sacral region n POA/bilateral pretibial and posterior LE venous stasis wounds POA: cont wound care, off loading, ace wrap,  Will need close follow-up with wound care as outpatient.  Paraplegia: Supportive care, position changing.  Malnutrition of moderate degree: Continue to augment nutrition.  DVT prophylaxis:[place] Lovenox. Code Status: Full code. Family Communication:no family at the bedside. I had called his daughter and updated yesterday.  Disposition Plan:SNF in 1-2 days if culture data neg and stable. He has a Bed ready.  Consultants: Orthopedic surgery  Procedures:  tenotomy of elbow, wrist and digital flexors  Antimicrobials:Zosyn Stopping Vancomycin 11/15  Subjective: Patient resting comfortably this morning denies any nausea, vomiting, chest pain, cough.  Right hand is painful with dressing in place. Objective: Vitals:   11/28/17 1631 11/28/17 2043 11/29/17 0035 11/29/17 0328  BP: 127/73 99/65 98/65  115/75  Pulse: (!) 111 100 99 99  Resp:  16  16  Temp: 99.3 F (37.4 C) (!) 100.5 F (38.1 C) 99.1 F (37.3 C) 98.5 F (36.9 C)  TempSrc: Oral Oral Oral Oral  SpO2:  97% 97% 99%  Weight:      Height:        Intake/Output Summary (Last 24 hours) at 11/29/2017 1214 Last data filed at 11/29/2017 0600 Gross per 24 hour  Intake 2220.18 ml  Output 2600 ml  Net -379.82 ml   Filed Weights   11/16/17 1526 11/22/17 1240 11/27/17 1424  Weight: 65.8 kg 65.8 kg 65.7 kg   Weight change:   Body mass index is 22.35 kg/m.  Examination:  General exam: Calm, comfortable,NAD, older  for HIS AGE,chronically sick looking average built.  HEENT:Oral mucosa moist,Ear/Nose WNL grossly Respiratory system: Bilateral equal air entry,no crackles and  wheezing Cardiovascular system: S1 & S2 heard, No JVD/murmurs.No pedal edema. Gastrointestinal system: Abdomen soft, nontender non-distended, BS +. Nervous System:Alert/awake/oriented at baseline. Able to move UE and LE Extremities: trace leg edema,distal peripheral pulses palpable. Rt hand w dressing in place and fingers visible are swollen. Skin:No rashes,no icterus. DVV:OHYWVP muscle bulk,tone ,power  Data Reviewed: I have personally reviewed following labs and imaging studies  CBC: Recent Labs  Lab 11/22/17 1333 11/24/17 0725 11/28/17 0201  WBC  --  4.1 11.1*  HGB 9.2* 8.5* 8.6*  HCT 27.0* 26.7* 27.1*  MCV  --  94.7 96.1  PLT  --  296 710   Basic Metabolic Panel:  Recent Labs  Lab 11/23/17 0729 11/24/17 0725 11/26/17 0438 11/26/17 2108 11/27/17 0225 11/28/17 0201  NA 136 135 134*  --  134* 134*  K 5.1 5.0 5.9* 6.0* 5.7* 5.0  CL 105 104 105  --  106 103  CO2 28 25 22   --  22 23  GLUCOSE 107* 108* 100*  --  124* 212*  BUN 31* 38* 53*  --  53* 33*  CREATININE 0.76 0.74 0.99  --  0.89 0.77  CALCIUM 9.0 8.7* 8.6*  --  8.7* 8.4*   GFR: Estimated Creatinine Clearance: 77.6 mL/min (by C-G formula based on SCr of 0.77 mg/dL). Liver Function Tests: No results for input(s): AST, ALT, ALKPHOS, BILITOT, PROT, ALBUMIN in the last 168 hours. No results for input(s): LIPASE, AMYLASE in the last 168 hours. No results for input(s): AMMONIA in the last 168 hours. Coagulation Profile: No results for input(s): INR, PROTIME in the last 168 hours. Cardiac Enzymes: No results for input(s): CKTOTAL, CKMB, CKMBINDEX, TROPONINI in the last 168 hours. BNP (last 3 results) No results for input(s): PROBNP in the last 8760 hours. HbA1C: No results for input(s): HGBA1C in the last 72 hours. CBG: Recent Labs  Lab 11/28/17 0655 11/28/17 1150 11/28/17 1629 11/28/17 2041 11/29/17 1156  GLUCAP 153* 196* 187* 155* 162*   Lipid Profile: No results for input(s): CHOL, HDL, LDLCALC,  TRIG, CHOLHDL, LDLDIRECT in the last 72 hours. Thyroid Function Tests: No results for input(s): TSH, T4TOTAL, FREET4, T3FREE, THYROIDAB in the last 72 hours. Anemia Panel: No results for input(s): VITAMINB12, FOLATE, FERRITIN, TIBC, IRON, RETICCTPCT in the last 72 hours. Sepsis Labs: Recent Labs  Lab 11/28/17 1544 11/28/17 1844 11/28/17 2203 11/29/17 0228  PROCALCITON <0.10  --   --  0.14  LATICACIDVEN 2.5* 2.0* 0.7  --     Recent Results (from the past 240 hour(s))  Respiratory Panel by PCR     Status: None   Collection Time: 11/28/17  2:58 PM  Result Value Ref Range Status   Adenovirus NOT DETECTED NOT DETECTED Final   Coronavirus 229E NOT DETECTED NOT DETECTED Final   Coronavirus HKU1 NOT DETECTED NOT DETECTED Final   Coronavirus NL63 NOT DETECTED NOT DETECTED Final   Coronavirus OC43 NOT DETECTED NOT DETECTED Final   Metapneumovirus NOT DETECTED NOT DETECTED Final   Rhinovirus / Enterovirus NOT DETECTED NOT DETECTED Final   Influenza A NOT DETECTED NOT DETECTED Final   Influenza B NOT DETECTED NOT DETECTED Final   Parainfluenza Virus 1 NOT DETECTED NOT DETECTED Final   Parainfluenza Virus 2 NOT DETECTED NOT DETECTED Final   Parainfluenza Virus 3 NOT DETECTED NOT DETECTED Final   Parainfluenza Virus 4 NOT DETECTED  NOT DETECTED Final   Respiratory Syncytial Virus NOT DETECTED NOT DETECTED Final   Bordetella pertussis NOT DETECTED NOT DETECTED Final   Chlamydophila pneumoniae NOT DETECTED NOT DETECTED Final   Mycoplasma pneumoniae NOT DETECTED NOT DETECTED Final    Comment: Performed at [facility], [place] [address], [place], [place] [address]  Culture, blood (routine x 2)     Status: None (Preliminary result)   Collection Time: 11/28/17  3:35 PM  Result Value Ref Range Status   Specimen Description BLOOD LEFT HAND  Final   Special Requests   Final    BOTTLES DRAWN AEROBIC ONLY Blood Culture results may not be optimal due to an inadequate volume of blood received in  culture bottles   Culture   Final    NO GROWTH < 24 HOURS Performed at [facility], [place] [address] [place], [place] [address]    Report Status PENDING  Incomplete  Culture, blood (routine x 2)     Status: None (Preliminary result)   Collection Time: 11/28/17  3:44 PM  Result Value Ref Range Status   Specimen Description BLOOD LEFT WRIST  Final   Special Requests   Final    BOTTLES DRAWN AEROBIC ONLY Blood Culture results may not be optimal due to an inadequate volume of blood received in culture bottles   Culture   Final    NO GROWTH < 24 HOURS Performed at [facility], [place] [address] [place], [place] [address]    Report Status PENDING  Incomplete      Radiology Studies: Dg Chest Port 1 View  Result Date: 11/28/2017 CLINICAL DATA:  Fever EXAM: PORTABLE CHEST 1 VIEW COMPARISON:  None. FINDINGS: No prior chest x-rays available for comparison. There is opacity medially at the left lung base overlying the heart shadow which may represent a patchy focus of pneumonia. Follow-up chest x-ray is recommended to ensure complete clearing after interval treatment. The right lung is clear. No pleural effusion is seen. Mediastinal and hilar contours are unremarkable and mild cardiomegaly is present. No acute bony abnormality is seen. IMPRESSION: 1. Parenchymal opacity medially at the left lung base suspicious for a focus of pneumonia. Recommend follow-up chest x-ray. 2. Mild cardiomegaly. Electronically Signed   By: Ivar Drape M.D.   On: 11/28/2017 16:45    Scheduled Meds: . collagenase   Topical Daily  . enoxaparin (LOVENOX) injection  40 mg Subcutaneous Q24H  . Influenza vac split quadrivalent PF  0.5 mL Intramuscular Tomorrow-1000  . insulin aspart  0-9 Units Subcutaneous TID WC  . multivitamin with minerals  1 tablet Oral Daily  . nutrition supplement (JUVEN)  1 packet Oral BID BM  . sodium chloride flush  3 mL Intravenous Q12H  . vancomycin variable dose per unstable renal  function (pharmacist dosing)   Does not apply See admin instructions   Continuous Infusions: . sodium chloride 250 mL (11/16/17 2224)  . sodium chloride 125 mL/hr at 11/28/17 1633  . lactated ringers 10 mL/hr at 11/27/17 1426  . piperacillin-tazobactam (ZOSYN)  IV 3.375 g (11/29/17 0623)  . vancomycin Stopped (11/28/17 1023)     LOS: 13 days   Time spent: More than 50% of that time was spent in counseling and/or coordination of care.  Antonieta Pert, MD [facility] Pager [phone]  If 7PM-7AM, please contact night-coverage [email or web address] Password [email or web address] 11/29/2017, 12:14 PM

## 2017-11-30 DIAGNOSIS — E875 Hyperkalemia: Secondary | ICD-10-CM

## 2017-11-30 DIAGNOSIS — T07XXXA Unspecified multiple injuries, initial encounter: Secondary | ICD-10-CM

## 2017-11-30 DIAGNOSIS — E440 Moderate protein-calorie malnutrition: Secondary | ICD-10-CM

## 2017-11-30 DIAGNOSIS — J181 Lobar pneumonia, unspecified organism: Secondary | ICD-10-CM

## 2017-11-30 DIAGNOSIS — D638 Anemia in other chronic diseases classified elsewhere: Secondary | ICD-10-CM

## 2017-11-30 DIAGNOSIS — E119 Type 2 diabetes mellitus without complications: Secondary | ICD-10-CM

## 2017-11-30 DIAGNOSIS — S61401A Unspecified open wound of right hand, initial encounter: Secondary | ICD-10-CM

## 2017-11-30 LAB — BASIC METABOLIC PANEL
Anion gap: 4 — ABNORMAL LOW (ref 5–15)
BUN: 27 mg/dL — AB (ref 8–23)
CALCIUM: 8.3 mg/dL — AB (ref 8.9–10.3)
CO2: 22 mmol/L (ref 22–32)
CREATININE: 0.88 mg/dL (ref 0.61–1.24)
Chloride: 110 mmol/L (ref 98–111)
Glucose, Bld: 99 mg/dL (ref 70–99)
Potassium: 5.4 mmol/L — ABNORMAL HIGH (ref 3.5–5.1)
SODIUM: 136 mmol/L (ref 135–145)

## 2017-11-30 LAB — CBC
HEMATOCRIT: 24.1 % — AB (ref 39.0–52.0)
Hemoglobin: 7.4 g/dL — ABNORMAL LOW (ref 13.0–17.0)
MCH: 29.8 pg (ref 26.0–34.0)
MCHC: 30.7 g/dL (ref 30.0–36.0)
MCV: 97.2 fL (ref 80.0–100.0)
NRBC: 0 % (ref 0.0–0.2)
PLATELETS: 245 10*3/uL (ref 150–400)
RBC: 2.48 MIL/uL — ABNORMAL LOW (ref 4.22–5.81)
RDW: 15.1 % (ref 11.5–15.5)
WBC: 6.8 10*3/uL (ref 4.0–10.5)

## 2017-11-30 LAB — GLUCOSE, CAPILLARY
GLUCOSE-CAPILLARY: 125 mg/dL — AB (ref 70–99)
GLUCOSE-CAPILLARY: 135 mg/dL — AB (ref 70–99)
GLUCOSE-CAPILLARY: 137 mg/dL — AB (ref 70–99)
Glucose-Capillary: 96 mg/dL (ref 70–99)

## 2017-11-30 LAB — STREP PNEUMONIAE URINARY ANTIGEN: STREP PNEUMO URINARY ANTIGEN: NEGATIVE

## 2017-11-30 MED ORDER — AMOXICILLIN-POT CLAVULANATE 875-125 MG PO TABS
1.0000 | ORAL_TABLET | Freq: Two times a day (BID) | ORAL | Status: DC
  Administered 2017-11-30 – 2017-12-02 (×4): 1 via ORAL
  Filled 2017-11-30 (×4): qty 1

## 2017-11-30 MED ORDER — SODIUM POLYSTYRENE SULFONATE 15 GM/60ML PO SUSP
15.0000 g | Freq: Every day | ORAL | Status: DC
  Administered 2017-11-30: 15 g via ORAL
  Filled 2017-11-30 (×2): qty 60

## 2017-11-30 MED ORDER — FUROSEMIDE 10 MG/ML IJ SOLN
20.0000 mg | Freq: Once | INTRAMUSCULAR | Status: AC
  Administered 2017-11-30: 20 mg via INTRAVENOUS
  Filled 2017-11-30: qty 2

## 2017-11-30 NOTE — Progress Notes (Signed)
Right hand remains wrapped in compression dressing.  All fingers remain swollen.  The right hand is elevated on a pillow. Yellowish clear fluid noted on fingers.  The patient's fingers were cleansed with normal saline.

## 2017-11-30 NOTE — Progress Notes (Signed)
PROGRESS NOTE  [name] [name] [id number]:[id number] DOB: [birth year] DOA: 11/16/2017 PCP: Beckie Salts, MD  Brief Narrative: 72 year old man PMH paraplegia secondary to [place] 2011, chronic wounds of right lower extremity, diabetes mellitus type 2, iron deficiency anemia, presented to the emergency department after daughter noticed increasing drainage from right hand wound.  Admitted for further evaluation of multiple wounds, suspected right hand infection.  Treated with empiric antibiotics.  Hand surgeon initially recommended conservative management with wound care but the patient could not tolerate hydrotherapy.  And surgeon then recommended transfer to [facility] for further evaluation.  Surgery was then planned but canceled secondary to hyperkalemia.  Surgery was subsequently performed 11/14.  Subsequently the patient had an episode of fever and hypotension, work-up suggested the possibility of pneumonia.  Assessment/Plan Right hand wounds, bilateral severe wrist and digital flexion contractures.  Cellulitis suspected by multiple physicians, however hand surgeon never documented evidence of infection and operative note makes no mention of infection. Completed 8 days of IV vancomycin. --Wound care per hand surgery.  Daily wound care to fingers only.  Follow-up with Dr. Burney Gauze in the office next week for formal dressing changes.  Left lower lobe pneumonia.  Postoperative in nature.  Suspect aspiration perioperatively.  Afebrile greater than 24 hours.  No hypoxia noted.  No evidence of pneumonia on examination. --Change to oral antibiotics.  Fever short course, total 5 days.  Multiple wounds of the extremities --Management per wound care RN  Modest hyperkalemia, recurrent, chronic, etiology unclear. --Continue IV fluids.  Lasix x1.  Apparently on daily Kayexalate.  Will resume Kayexalate.  Repeat BMP in a.m.  Diabetes mellitus type 2.  Metformin discontinued in the past secondary to metabolic  acidosis. --CBG stable.  Iron deficiency anemia, anemia of chronic disease --Somewhat worse today.  No evidence of bleeding.  Check CBC in a.m.  Essential hypertension, not on ACE inhibitor secondary to recurrent hyperkalemia --Stable.  Hyperlipidemia, statin was discontinued by PCP.  Paraplegia  Moderate malnutrition --Management per dietitian.   Recheck BMP in a.m., potassium stable, may be able to transfer to skilled nursing facility.  Change to oral antibiotics.  DVT prophylaxis: enoxaparin Code Status: Full Family Communication: none Disposition Plan: SNF    Murray Hodgkins, MD  [facility] Direct contact: [phone] --Via amion app OR  --[email or web address]; password [email or web address]  7PM-7AM contact night coverage as above 11/30/2017, 4:19 PM  LOS: 14 days   Consultants:  Hand surgery  Procedures: Flexor tenotomy of the digital flexors, wrist flexors, and elbow flexors, right upper extremity.  Antimicrobials:  Zosyn 11/14 > 11/16  Augmentin 11/16 > 11/18  Interval history/Subjective: Has some pain in right hand but otherwise ok. Eating fine. Breathing fine. Some cough.  Objective: Vitals:  Vitals:   11/30/17 0538 11/30/17 1100  BP: 94/66 123/86  Pulse: 81 82  Resp: 18 18  Temp: 98.8 F (37.1 C) 98.6 F (37 C)  SpO2: 99% 100%    Exam:  Constitutional:  . Appears calm and comfortable Respiratory:  . CTA bilaterally, no w/r/r.  . Respiratory effort normal.  Cardiovascular:  . RRR, no m/r/g . No pedal extremity edema  Musculoskeletal:  . RUE . Bandaged, fingers edematous, red, wounds noted Psychiatric:  . Mental status o Mood, affect appropriate  I have personally reviewed the following:   Data: . CBG stable . Potassium 5.4, remainder BMP unremarkable. . Hemoglobin trending down, 7.4.  Scheduled Meds: . amoxicillin-clavulanate  1 tablet Oral Q12H  . collagenase   Topical Daily  .  enoxaparin (LOVENOX) injection  40 mg Subcutaneous Q24H   . furosemide  20 mg Intravenous Once  . Influenza vac split quadrivalent PF  0.5 mL Intramuscular Tomorrow-1000  . insulin aspart  0-9 Units Subcutaneous TID WC  . multivitamin with minerals  1 tablet Oral Daily  . nutrition supplement (JUVEN)  1 packet Oral BID BM  . sodium chloride flush  3 mL Intravenous Q12H  . sodium polystyrene  15 g Oral Daily   Continuous Infusions: . sodium chloride 250 mL (11/16/17 2224)  . sodium chloride 125 mL/hr at 11/28/17 1633  . lactated ringers 10 mL/hr at 11/27/17 1426    Principal Problem:   Open wound of right hand with complication Active Problems:   Hyperkalemia   Essential hypertension   Diabetes mellitus without complication (HCC)   Wounds, multiple   Multiple wounds   Paraplegia (HCC)   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 ([place])   Malnutrition of moderate degree   Lobar pneumonia (HCC)   Anemia of chronic disease   LOS: 14 days    Time spent 45 minutes, greater than 50% in coordination of care

## 2017-12-01 LAB — BASIC METABOLIC PANEL
ANION GAP: 7 (ref 5–15)
BUN: 37 mg/dL — AB (ref 8–23)
CO2: 23 mmol/L (ref 22–32)
Calcium: 8.2 mg/dL — ABNORMAL LOW (ref 8.9–10.3)
Chloride: 107 mmol/L (ref 98–111)
Creatinine, Ser: 1.14 mg/dL (ref 0.61–1.24)
GFR calc Af Amer: >60 mL/min (ref >60)
GLUCOSE: 109 mg/dL — AB (ref 70–99)
POTASSIUM: 5.8 mmol/L — AB (ref 3.5–5.1)
Sodium: 137 mmol/L (ref 135–145)

## 2017-12-01 LAB — GLUCOSE, CAPILLARY
GLUCOSE-CAPILLARY: 154 mg/dL — AB (ref 70–99)
GLUCOSE-CAPILLARY: 175 mg/dL — AB (ref 70–99)
Glucose-Capillary: 82 mg/dL (ref 70–99)
Glucose-Capillary: 135 mg/dL — ABNORMAL HIGH (ref 70–99)

## 2017-12-01 LAB — LEGIONELLA PNEUMOPHILA SEROGP 1 UR AG: L. PNEUMOPHILA SEROGP 1 UR AG: NEGATIVE

## 2017-12-01 LAB — OSMOLALITY, URINE: Osmolality, Ur: 295 mOsm/kg — ABNORMAL LOW (ref 300–900)

## 2017-12-01 LAB — NA AND K (SODIUM & POTASSIUM), RAND UR
Potassium Urine: 6 mmol/L
Sodium, Ur: 115 mmol/L

## 2017-12-01 LAB — CREATININE, URINE, RANDOM: Creatinine, Urine: <10 mg/dL

## 2017-12-01 MED ORDER — SODIUM POLYSTYRENE SULFONATE 15 GM/60ML PO SUSP
60.0000 g | Freq: Once | ORAL | Status: AC
  Administered 2017-12-01: 60 g via ORAL
  Filled 2017-12-01: qty 240

## 2017-12-01 MED ORDER — SODIUM CHLORIDE 0.9 % IV BOLUS
500.0000 mL | Freq: Once | INTRAVENOUS | Status: AC
  Administered 2017-12-01: 500 mL via INTRAVENOUS

## 2017-12-01 MED ORDER — FUROSEMIDE 10 MG/ML IJ SOLN
40.0000 mg | Freq: Once | INTRAMUSCULAR | Status: AC
  Administered 2017-12-01: 40 mg via INTRAVENOUS
  Filled 2017-12-01: qty 4

## 2017-12-01 NOTE — Plan of Care (Signed)
  Problem: Elimination: Goal: Will not experience complications related to bowel motility Outcome: Progressing Goal: Will not experience complications related to urinary retention Outcome: Progressing   Problem: Pain Managment: Goal: General experience of comfort will improve Outcome: Progressing   Problem: Safety: Goal: Ability to remain free from injury will improve Outcome: Progressing   
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
## 2017-12-02 DIAGNOSIS — S61401A Unspecified open wound of right hand, initial encounter: Principal | ICD-10-CM

## 2017-12-02 LAB — BASIC METABOLIC PANEL
Anion gap: 5 (ref 5–15)
BUN: 30 mg/dL — ABNORMAL HIGH (ref 8–23)
CO2: 26 mmol/L (ref 22–32)
Calcium: 8.2 mg/dL — ABNORMAL LOW (ref 8.9–10.3)
GFR calc non Af Amer: >60 mL/min (ref >60)
Glucose, Bld: 107 mg/dL — ABNORMAL HIGH (ref 70–99)

## 2017-12-02 LAB — GLUCOSE, CAPILLARY
GLUCOSE-CAPILLARY: 90 mg/dL (ref 70–99)
GLUCOSE-CAPILLARY: 106 mg/dL — AB (ref 70–99)

## 2017-12-02 LAB — CBC
HCT: 23.8 % — ABNORMAL LOW (ref 39.0–52.0)
Hemoglobin: 7.5 g/dL — ABNORMAL LOW (ref 13.0–17.0)
MCH: 30.1 pg (ref 26.0–34.0)
MCHC: 31.5 g/dL (ref 30.0–36.0)
MCV: 95.6 fL (ref 80.0–100.0)
Platelets: 298 10*3/uL (ref 150–400)
RBC: 2.49 MIL/uL — ABNORMAL LOW (ref 4.22–5.81)
RDW: 15 % (ref 11.5–15.5)
WBC: 5.7 K/uL (ref 4.0–10.5)
nRBC: 0 % (ref 0.0–0.2)

## 2017-12-02 LAB — BASIC METABOLIC PANEL WITH GFR
Chloride: 106 mmol/L (ref 98–111)
Creatinine, Ser: 0.79 mg/dL (ref 0.61–1.24)
GFR calc Af Amer: >60 mL/min (ref >60)
Potassium: 5.1 mmol/L (ref 3.5–5.1)
Sodium: 137 mmol/L (ref 135–145)

## 2017-12-02 MED ORDER — INSULIN ASPART 100 UNIT/ML ~~LOC~~ SOLN: 0.0000 [IU] | Freq: Three times a day (TID) | SUBCUTANEOUS | 11 refills | Status: DC

## 2017-12-02 MED ORDER — COLLAGENASE 250 UNIT/GM EX OINT: TOPICAL_OINTMENT | Freq: Every day | CUTANEOUS | 0 refills | Status: DC

## 2017-12-02 MED ORDER — HYDROCODONE-ACETAMINOPHEN 5-325 MG PO TABS: 1.0000 | ORAL_TABLET | ORAL | 0 refills | Status: AC | PRN

## 2017-12-02 MED ORDER — ADULT MULTIVITAMIN W/MINERALS CH: 1.0000 | ORAL_TABLET | Freq: Every day | ORAL | 0 refills | Status: DC

## 2017-12-02 MED ORDER — AMOXICILLIN-POT CLAVULANATE 875-125 MG PO TABS: 1.0000 | ORAL_TABLET | Freq: Two times a day (BID) | ORAL | 0 refills | Status: DC

## 2017-12-02 MED ORDER — JUVEN PO PACK: 1.0000 | PACK | Freq: Two times a day (BID) | ORAL | 0 refills | Status: DC

## 2017-12-02 MED ORDER — ACETAMINOPHEN 325 MG PO TABS: 650.0000 mg | ORAL_TABLET | Freq: Four times a day (QID) | ORAL | 0 refills | Status: DC | PRN

## 2017-12-02 NOTE — Clinical Social Work Placement (Signed)
   CLINICAL SOCIAL WORK PLACEMENT  NOTE  Date:  12/02/2017  Patient Details  Name: [name] [name] MRN: [id number] Date of Birth: Feb 27, 1945  Clinical Social Work is seeking post-discharge placement for this patient at the [place] level of care (*CSW will initial, date and re-position this form in  chart as items are completed):  Yes   Patient/family provided with [facility] Work Department's list of facilities offering this level of care within the geographic area requested by the patient (or if unable, by the patient's family).  Yes   Patient/family informed of their freedom to choose among providers that offer the needed level of care, that participate in Medicare, Medicaid or managed care program needed by the patient, have an available bed and are willing to accept the patient.      Patient/family informed of [facility]'s ownership interest in [facility] and [facility], as well as of the fact that they are under no obligation to receive care at these facilities.  PASRR submitted to EDS on       PASRR number received on 11/22/17     Existing PASRR number confirmed on       FL2 transmitted to all facilities in geographic area requested by pt/family on 11/22/17     FL2 transmitted to all facilities within larger geographic area on       Patient informed that his/her managed care company has contracts with or will negotiate with certain facilities, including the following:        Yes   Patient/family informed of bed offers received.  Patient chooses bed at [facility] and patient chooses bed at      Patient to be transferred to [facility] on 12/02/17.  Patient to be transferred to facility by [facility]     Patient family notified on 12/02/17 of transfer.  Name of family member notified:  [place]     PHYSICIAN       Additional Comment:     _______________________________________________ Alberteen Sam, LCSW 12/02/2017, 11:27 AM

## 2017-12-02 NOTE — Plan of Care (Signed)
  Problem: Clinical Measurements: Goal: Ability to maintain clinical measurements within normal limits will improve Outcome: Progressing   Problem: Nutrition: Goal: Adequate nutrition will be maintained Outcome: Progressing   Problem: Pain Managment: Goal: General experience of comfort will improve Outcome: Progressing   Problem: Safety: Goal: Ability to remain free from injury will improve Outcome: Progressing   Problem: Skin Integrity: Goal: Risk for impaired skin integrity will decrease Outcome: Progressing   

## 2017-12-02 NOTE — Progress Notes (Addendum)
Patient will DC to: [facility] Anticipated DC date: 12/02/17 Family notified: Onnie Graham, no answer and voicemail not set up.  Transport by: Corey Harold  Per MD patient ready for DC to [facility] . RN, patient, patient's family, and facility notified of DC. Discharge Summary sent to facility. RN given number for report [phone]. DC packet on chart. Ambulance transport requested for patient.  CSW signing off.  [place], [place]

## 2017-12-02 NOTE — Progress Notes (Signed)
Patient discharging to [facility]. Report called in to [facility]. Awaiting transportation.
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
## 2017-12-03 ENCOUNTER — Encounter: Payer: Self-pay | Admitting: Internal Medicine

## 2017-12-03 ENCOUNTER — Non-Acute Institutional Stay (SKILLED_NURSING_FACILITY): Payer: Medicare Other | Admitting: Internal Medicine

## 2017-12-03 DIAGNOSIS — E875 Hyperkalemia: Secondary | ICD-10-CM | POA: Diagnosis not present

## 2017-12-03 DIAGNOSIS — R29818 Other symptoms and signs involving the nervous system: Secondary | ICD-10-CM

## 2017-12-03 DIAGNOSIS — S61401D Unspecified open wound of right hand, subsequent encounter: Secondary | ICD-10-CM | POA: Diagnosis not present

## 2017-12-03 DIAGNOSIS — J181 Lobar pneumonia, unspecified organism: Secondary | ICD-10-CM

## 2017-12-03 DIAGNOSIS — E119 Type 2 diabetes mellitus without complications: Secondary | ICD-10-CM

## 2017-12-03 DIAGNOSIS — D509 Iron deficiency anemia, unspecified: Secondary | ICD-10-CM

## 2017-12-03 DIAGNOSIS — R4189 Other symptoms and signs involving cognitive functions and awareness: Secondary | ICD-10-CM

## 2017-12-03 LAB — CULTURE, BLOOD (ROUTINE X 2)
CULTURE: NO GROWTH
Culture: NO GROWTH

## 2017-12-03 NOTE — Patient Instructions (Signed)
See assessment and plan under each diagnosis in the problem list and acutely for this visit 
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
## 2017-12-03 NOTE — Assessment & Plan Note (Signed)
Epic revealed an A1c of 5.6% on 04/08/2015, apparently monitored at his PCP's office

## 2017-12-03 NOTE — Assessment & Plan Note (Addendum)
Hemoglobin 7.5/hematocrit 23.8 with normochromic, normocytic indices Continue iron supplementation

## 2017-12-03 NOTE — Assessment & Plan Note (Signed)
SLUMS testing to define severity of neurocognitive deficit

## 2017-12-03 NOTE — Assessment & Plan Note (Signed)
On Augmentin for 7 days at discharge Speech Therapy to monitor for dysphagia and aspiration risk

## 2017-12-03 NOTE — Assessment & Plan Note (Addendum)
Orthopedic follow-up with Dr. Burney Gauze in 1 week Augmentin initiated for 1 week beginning 02/01/2017

## 2017-12-05 LAB — CBC AND DIFFERENTIAL
HEMATOCRIT: 24 — AB (ref 41–53)
HEMOGLOBIN: 8.2 — AB (ref 13.5–17.5)
Neutrophils Absolute: 5
PLATELETS: 304 (ref 150–399)
WBC: 8

## 2017-12-05 LAB — HEMOGLOBIN A1C: Hgb A1c MFr Bld: 5.9 (ref 4.0–6.0)

## 2017-12-23 ENCOUNTER — Encounter: Payer: Self-pay | Admitting: Adult Health

## 2017-12-23 ENCOUNTER — Non-Acute Institutional Stay (SKILLED_NURSING_FACILITY): Payer: Medicare Other | Admitting: Adult Health

## 2017-12-23 DIAGNOSIS — D508 Other iron deficiency anemias: Secondary | ICD-10-CM

## 2017-12-23 DIAGNOSIS — E875 Hyperkalemia: Secondary | ICD-10-CM

## 2017-12-23 DIAGNOSIS — T07XXXA Unspecified multiple injuries, initial encounter: Secondary | ICD-10-CM | POA: Diagnosis not present

## 2017-12-23 DIAGNOSIS — E119 Type 2 diabetes mellitus without complications: Secondary | ICD-10-CM | POA: Diagnosis not present
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
## 2017-12-26 ENCOUNTER — Encounter (HOSPITAL_BASED_OUTPATIENT_CLINIC_OR_DEPARTMENT_OTHER): Payer: Medicare Other | Attending: Internal Medicine

## 2017-12-31 ENCOUNTER — Other Ambulatory Visit (HOSPITAL_BASED_OUTPATIENT_CLINIC_OR_DEPARTMENT_OTHER): Payer: Self-pay | Admitting: Internal Medicine

## 2018-01-03 ENCOUNTER — Non-Acute Institutional Stay (SKILLED_NURSING_FACILITY): Payer: Medicare Other | Admitting: Adult Health

## 2018-01-03 ENCOUNTER — Encounter: Payer: Self-pay | Admitting: Adult Health

## 2018-01-03 DIAGNOSIS — E119 Type 2 diabetes mellitus without complications: Secondary | ICD-10-CM | POA: Diagnosis not present

## 2018-01-03 DIAGNOSIS — E876 Hypokalemia: Secondary | ICD-10-CM

## 2018-01-03 LAB — BASIC METABOLIC PANEL
BUN: 15 (ref 4–21)
CREATININE: 0.5 — AB (ref 0.6–1.3)
GLUCOSE: 96
Potassium: 2 — AB (ref 3.4–5.3)
Sodium: 145 (ref 137–147)
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
## 2018-01-06 LAB — BASIC METABOLIC PANEL
BUN: 24 — AB (ref 4–21)
CREATININE: 0.6 (ref 0.6–1.3)
Glucose: 116
Potassium: 3.1 — AB (ref 3.4–5.3)
Sodium: 143 (ref 137–147)

## 2018-01-14 LAB — BASIC METABOLIC PANEL
BUN: 33 — AB (ref 4–21)
CREATININE: 0.5 — AB (ref 0.6–1.3)
GLUCOSE: 89
POTASSIUM: 3.2 — AB (ref 3.4–5.3)
Sodium: 145 (ref 137–147)

## 2018-01-17 ENCOUNTER — Encounter: Payer: Self-pay | Admitting: Adult Health

## 2018-01-17 ENCOUNTER — Non-Acute Institutional Stay (SKILLED_NURSING_FACILITY): Payer: Medicare Other | Admitting: Adult Health

## 2018-01-17 DIAGNOSIS — D508 Other iron deficiency anemias: Secondary | ICD-10-CM | POA: Diagnosis not present

## 2018-01-17 DIAGNOSIS — E876 Hypokalemia: Secondary | ICD-10-CM | POA: Diagnosis not present

## 2018-01-17 DIAGNOSIS — E119 Type 2 diabetes mellitus without complications: Secondary | ICD-10-CM

## 2018-01-17 DIAGNOSIS — L89153 Pressure ulcer of sacral region, stage 3: Secondary | ICD-10-CM
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
## 2018-01-24 ENCOUNTER — Encounter (HOSPITAL_BASED_OUTPATIENT_CLINIC_OR_DEPARTMENT_OTHER): Payer: PRIVATE HEALTH INSURANCE | Attending: Internal Medicine

## 2018-01-24 DIAGNOSIS — L97212 Non-pressure chronic ulcer of right calf with fat layer exposed: Secondary | ICD-10-CM | POA: Diagnosis not present

## 2018-01-24 DIAGNOSIS — L905 Scar conditions and fibrosis of skin: Secondary | ICD-10-CM | POA: Diagnosis not present

## 2018-01-24 DIAGNOSIS — S61401D Unspecified open wound of right hand, subsequent encounter: Secondary | ICD-10-CM | POA: Diagnosis not present

## 2018-01-24 DIAGNOSIS — L97222 Non-pressure chronic ulcer of left calf with fat layer exposed: Secondary | ICD-10-CM | POA: Insufficient documentation

## 2018-01-24 DIAGNOSIS — L97822 Non-pressure chronic ulcer of other part of left lower leg with fat layer exposed: Secondary | ICD-10-CM | POA: Insufficient documentation

## 2018-01-24 DIAGNOSIS — L97812 Non-pressure chronic ulcer of other part of right lower leg with fat layer exposed: Secondary | ICD-10-CM | POA: Diagnosis not present

## 2018-01-24 DIAGNOSIS — X58XXXD Exposure to other specified factors, subsequent encounter: Secondary | ICD-10-CM | POA: Insufficient documentation

## 2018-01-31 ENCOUNTER — Non-Acute Institutional Stay (SKILLED_NURSING_FACILITY): Payer: Medicare Other | Admitting: Adult Health

## 2018-01-31 ENCOUNTER — Encounter: Payer: Self-pay | Admitting: Adult Health

## 2018-01-31 DIAGNOSIS — E875 Hyperkalemia: Secondary | ICD-10-CM

## 2018-01-31 LAB — CBC AND DIFFERENTIAL
HCT: 36 — AB (ref 41–53)
HEMOGLOBIN: 12.5 — AB (ref 13.5–17.5)
NEUTROS ABS: 3
PLATELETS: 277 (ref 150–399)
WBC: 5.3

## 2018-01-31 LAB — BASIC METABOLIC PANEL
BUN: 37 — AB (ref 4–21)
CREATININE: 0.6 (ref 0.6–1.3)
Glucose: 93
Potassium: 7.4 — AB (ref 3.4–5.3)
SODIUM: 145 (ref 137–147)
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
## 2018-02-10 DIAGNOSIS — L97812 Non-pressure chronic ulcer of other part of right lower leg with fat layer exposed: Secondary | ICD-10-CM | POA: Diagnosis not present

## 2018-02-18 ENCOUNTER — Encounter: Payer: Self-pay | Admitting: Internal Medicine

## 2018-02-18 ENCOUNTER — Non-Acute Institutional Stay (SKILLED_NURSING_FACILITY): Payer: Medicare Other | Admitting: Internal Medicine

## 2018-02-18 DIAGNOSIS — E875 Hyperkalemia: Secondary | ICD-10-CM | POA: Diagnosis not present

## 2018-02-18 DIAGNOSIS — R29818 Other symptoms and signs involving the nervous system: Secondary | ICD-10-CM | POA: Diagnosis not present

## 2018-02-18 DIAGNOSIS — S61401D Unspecified open wound of right hand, subsequent encounter: Secondary | ICD-10-CM | POA: Diagnosis not present

## 2018-02-18 DIAGNOSIS — R4189 Other symptoms and signs involving cognitive functions and awareness: Secondary | ICD-10-CM

## 2018-02-18 LAB — BASIC METABOLIC PANEL
BUN: 36 — AB (ref 4–21)
Creatinine: 0.7 (ref 0.6–1.3)
GLUCOSE: 98
POTASSIUM: 5.3 (ref 3.4–5.3)
Sodium: 140 (ref 137–147)

## 2018-02-18 NOTE — Assessment & Plan Note (Addendum)
Results of SLUMS testing discussed with his daughter. DON has informed her of behavioral issues. Psychiatry NP will be asked to assess the patient to assess competency.  Daughter should obtain healthcare power of attorney If behavior persist or accelerates, he should be sent to [facility] ED psych unit. He does not voice UTI symptoms. He is incontinent and urine cannot be collected for C&S without in and out cath. This would be considered if no better with qhs Seroquel trial.

## 2018-02-18 NOTE — Assessment & Plan Note (Addendum)
Seen by Dr. Burney Gauze today, follow-up in 2 weeks Wound Care Nurse & Nursing staff state he is refusing dressing changes & transport to Wound Clinic by EMT DON has discussed this his daughter. She'll be contacted if behaviors persist which potentially jepordize his well being or that of staff.
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
## 2018-02-18 NOTE — Patient Instructions (Addendum)
See assessment and plan under each diagnosis in the problem list and acutely for this visit Total time 47 minutes; greater than 50% of the visit spent counseling patient's daughter and coordinating care for problems addressed at this encounter

## 2018-02-18 NOTE — Assessment & Plan Note (Signed)
fasting cortisol, PRA, serum aldosterone

## 2018-02-19 ENCOUNTER — Encounter: Payer: Self-pay | Admitting: Internal Medicine

## 2018-02-19 LAB — BASIC METABOLIC PANEL
BUN: 55 — AB (ref 4–21)
CREATININE: 0.7 (ref 0.6–1.3)
Glucose: 107
Potassium: 5.3 (ref 3.4–5.3)
SODIUM: 139 (ref 137–147)

## 2018-02-20 ENCOUNTER — Other Ambulatory Visit: Payer: Self-pay | Admitting: Internal Medicine

## 2018-02-20 ENCOUNTER — Other Ambulatory Visit: Payer: Self-pay

## 2018-02-20 MED ORDER — UNABLE TO FIND: 0.5000 mg | Freq: Three times a day (TID) | 0 refills | Status: DC | PRN

## 2018-02-20 MED ORDER — UNABLE TO FIND: 0.5000 mg | Freq: Three times a day (TID) | 0 refills | Status: AC | PRN

## 2018-02-20 MED ORDER — TRAMADOL HCL 50 MG PO TABS: 50.0000 mg | ORAL_TABLET | Freq: Three times a day (TID) | ORAL | 0 refills | Status: DC | PRN

## 2018-02-20 NOTE — Telephone Encounter (Signed)
Hard script was written and faxed to [facility].  Was unable to transmit to [facility] electronically as it kept printing, even though it normal mode was selected.

## 2018-02-20 NOTE — Addendum Note (Signed)
Addended by: Bonney Leitz T on: 02/20/2018 03:47 PM   Modules accepted: Orders

## 2018-02-20 NOTE — Telephone Encounter (Signed)
New order for Ativan gel pended to Dr. Linna Darner for his approval and transmittal to [facility] once improved.

## 2018-02-20 NOTE — Addendum Note (Signed)
Addended by: Bonney Leitz T on: 02/20/2018 03:31 PM   Modules accepted: Orders

## 2018-02-20 NOTE — Telephone Encounter (Signed)
New order for Ativan gel completed and forwarded to Dr. Linna Darner to approve and transmit to [facility].

## 2018-02-20 NOTE — Telephone Encounter (Signed)
New medication request for Tramadol pended to Dr. Linna Darner for approval and transmittal to [facility].

## 2018-02-20 NOTE — Addendum Note (Signed)
Addended by: Bonney Leitz T on: 02/20/2018 03:16 PM   Modules accepted: Orders

## 2018-02-24 ENCOUNTER — Encounter (HOSPITAL_BASED_OUTPATIENT_CLINIC_OR_DEPARTMENT_OTHER): Payer: Medicare Other | Attending: Internal Medicine

## 2018-02-24 DIAGNOSIS — L97212 Non-pressure chronic ulcer of right calf with fat layer exposed: Secondary | ICD-10-CM | POA: Diagnosis not present

## 2018-02-24 DIAGNOSIS — G8220 Paraplegia, unspecified: Secondary | ICD-10-CM | POA: Diagnosis not present

## 2018-02-24 DIAGNOSIS — L97222 Non-pressure chronic ulcer of left calf with fat layer exposed: Secondary | ICD-10-CM | POA: Insufficient documentation

## 2018-02-24 DIAGNOSIS — X58XXXA Exposure to other specified factors, initial encounter: Secondary | ICD-10-CM | POA: Diagnosis not present

## 2018-02-24 DIAGNOSIS — L97812 Non-pressure chronic ulcer of other part of right lower leg with fat layer exposed: Secondary | ICD-10-CM | POA: Insufficient documentation

## 2018-02-24 DIAGNOSIS — L97822 Non-pressure chronic ulcer of other part of left lower leg with fat layer exposed: Secondary | ICD-10-CM | POA: Diagnosis not present

## 2018-02-24 DIAGNOSIS — S61401A Unspecified open wound of right hand, initial encounter: Secondary | ICD-10-CM | POA: Diagnosis not present

## 2018-02-24 DIAGNOSIS — I10 Essential (primary) hypertension: Secondary | ICD-10-CM | POA: Insufficient documentation

## 2018-02-24 DIAGNOSIS — E119 Type 2 diabetes mellitus without complications: Secondary | ICD-10-CM | POA: Insufficient documentation

## 2018-03-10 ENCOUNTER — Non-Acute Institutional Stay (SKILLED_NURSING_FACILITY): Payer: Medicare Other | Admitting: Internal Medicine

## 2018-03-10 ENCOUNTER — Encounter: Payer: Self-pay | Admitting: Internal Medicine

## 2018-03-10 DIAGNOSIS — E875 Hyperkalemia: Secondary | ICD-10-CM | POA: Diagnosis not present

## 2018-03-10 DIAGNOSIS — R29818 Other symptoms and signs involving the nervous system: Secondary | ICD-10-CM

## 2018-03-10 DIAGNOSIS — D508 Other iron deficiency anemias: Secondary | ICD-10-CM | POA: Diagnosis not present

## 2018-03-10 DIAGNOSIS — S61401D Unspecified open wound of right hand, subsequent encounter: Secondary | ICD-10-CM | POA: Diagnosis not present

## 2018-03-10 DIAGNOSIS — E119 Type 2 diabetes mellitus without complications: Secondary | ICD-10-CM | POA: Diagnosis not present

## 2018-03-10 DIAGNOSIS — R4189 Other symptoms and signs involving cognitive functions and awareness: Secondary | ICD-10-CM

## 2018-03-10 DIAGNOSIS — L97812 Non-pressure chronic ulcer of other part of right lower leg with fat layer exposed: Secondary | ICD-10-CM | POA: Diagnosis not present

## 2018-03-10 MED ORDER — TRAMADOL HCL 50 MG PO TABS: 50.0000 mg | ORAL_TABLET | Freq: Three times a day (TID) | ORAL | 0 refills | Status: DC | PRN

## 2018-03-10 MED ORDER — METFORMIN HCL ER 500 MG PO TB24: 500.0000 mg | ORAL_TABLET | Freq: Every day | ORAL | 0 refills | Status: DC
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
## 2018-03-11 MED ORDER — FERROUS SULFATE 325 (65 FE) MG PO TABS: 325.0000 mg | ORAL_TABLET | Freq: Every day | ORAL | 0 refills | Status: DC

## 2018-03-11 NOTE — Addendum Note (Signed)
Addended by: Wille Celeste on: 03/11/2018 08:40 AM   Modules accepted: Orders

## 2018-03-12 NOTE — Addendum Note (Signed)
Addended by: Bonney Leitz T on: 03/12/2018 03:49 PM   Modules accepted: Orders

## 2018-04-03 ENCOUNTER — Other Ambulatory Visit: Payer: Self-pay | Admitting: Internal Medicine

## 2018-04-29 ENCOUNTER — Other Ambulatory Visit: Payer: Self-pay | Admitting: Internal Medicine

## 2018-06-02 ENCOUNTER — Encounter (HOSPITAL_BASED_OUTPATIENT_CLINIC_OR_DEPARTMENT_OTHER): Payer: Medicare Other | Attending: Internal Medicine

## 2018-06-02 DIAGNOSIS — L97812 Non-pressure chronic ulcer of other part of right lower leg with fat layer exposed: Secondary | ICD-10-CM | POA: Diagnosis not present

## 2018-06-02 DIAGNOSIS — L98492 Non-pressure chronic ulcer of skin of other sites with fat layer exposed: Secondary | ICD-10-CM | POA: Insufficient documentation

## 2018-06-02 DIAGNOSIS — L97822 Non-pressure chronic ulcer of other part of left lower leg with fat layer exposed: Secondary | ICD-10-CM | POA: Diagnosis not present

## 2018-07-03 ENCOUNTER — Encounter (HOSPITAL_BASED_OUTPATIENT_CLINIC_OR_DEPARTMENT_OTHER): Payer: Medicare Other | Attending: Internal Medicine

## 2018-07-03 DIAGNOSIS — L97822 Non-pressure chronic ulcer of other part of left lower leg with fat layer exposed: Secondary | ICD-10-CM | POA: Insufficient documentation

## 2018-07-03 DIAGNOSIS — I10 Essential (primary) hypertension: Secondary | ICD-10-CM | POA: Insufficient documentation

## 2018-07-03 DIAGNOSIS — L97812 Non-pressure chronic ulcer of other part of right lower leg with fat layer exposed: Secondary | ICD-10-CM | POA: Insufficient documentation

## 2018-07-03 DIAGNOSIS — G8220 Paraplegia, unspecified: Secondary | ICD-10-CM | POA: Insufficient documentation

## 2018-07-03 DIAGNOSIS — L97212 Non-pressure chronic ulcer of right calf with fat layer exposed: Secondary | ICD-10-CM | POA: Insufficient documentation

## 2018-07-03 DIAGNOSIS — E119 Type 2 diabetes mellitus without complications: Secondary | ICD-10-CM | POA: Insufficient documentation

## 2018-07-03 DIAGNOSIS — L98492 Non-pressure chronic ulcer of skin of other sites with fat layer exposed: Secondary | ICD-10-CM | POA: Insufficient documentation

## 2018-07-15 ENCOUNTER — Other Ambulatory Visit (HOSPITAL_COMMUNITY)
Admission: RE | Admit: 2018-07-15 | Discharge: 2018-07-15 | Disposition: A | Discharge status: Home / Self Care | Payer: Medicare Other | Source: Other Acute Inpatient Hospital | Attending: Internal Medicine | Admitting: Internal Medicine

## 2018-07-15 DIAGNOSIS — L98492 Non-pressure chronic ulcer of skin of other sites with fat layer exposed: Secondary | ICD-10-CM | POA: Diagnosis present

## 2018-07-15 DIAGNOSIS — E119 Type 2 diabetes mellitus without complications: Secondary | ICD-10-CM | POA: Diagnosis not present

## 2018-07-15 DIAGNOSIS — G8220 Paraplegia, unspecified: Secondary | ICD-10-CM | POA: Diagnosis not present

## 2018-07-15 DIAGNOSIS — I10 Essential (primary) hypertension: Secondary | ICD-10-CM | POA: Diagnosis not present

## 2018-07-15 DIAGNOSIS — L97221 Non-pressure chronic ulcer of left calf limited to breakdown of skin: Secondary | ICD-10-CM | POA: Insufficient documentation

## 2018-07-15 DIAGNOSIS — L97812 Non-pressure chronic ulcer of other part of right lower leg with fat layer exposed: Secondary | ICD-10-CM | POA: Diagnosis not present

## 2018-07-15 DIAGNOSIS — L97212 Non-pressure chronic ulcer of right calf with fat layer exposed: Secondary | ICD-10-CM | POA: Diagnosis not present

## 2018-07-15 DIAGNOSIS — L97822 Non-pressure chronic ulcer of other part of left lower leg with fat layer exposed: Secondary | ICD-10-CM | POA: Diagnosis not present

## 2018-07-18 LAB — AEROBIC CULTURE? (SUPERFICIAL SPECIMEN): Culture: NO GROWTH

## 2018-07-18 LAB — AEROBIC CULTURE W GRAM STAIN (SUPERFICIAL SPECIMEN)

## 2018-09-13 IMAGING — CR DG TIBIA/FIBULA 2V*R*
4 series · 4 of 4 positions shown · non-contrast
Comparison: [DATE]

CLINICAL DATA: Drainage, suspected sepsis

EXAM:
RIGHT TIBIA AND FIBULA - 2 VIEW

[tibia ap (1 of 2)]
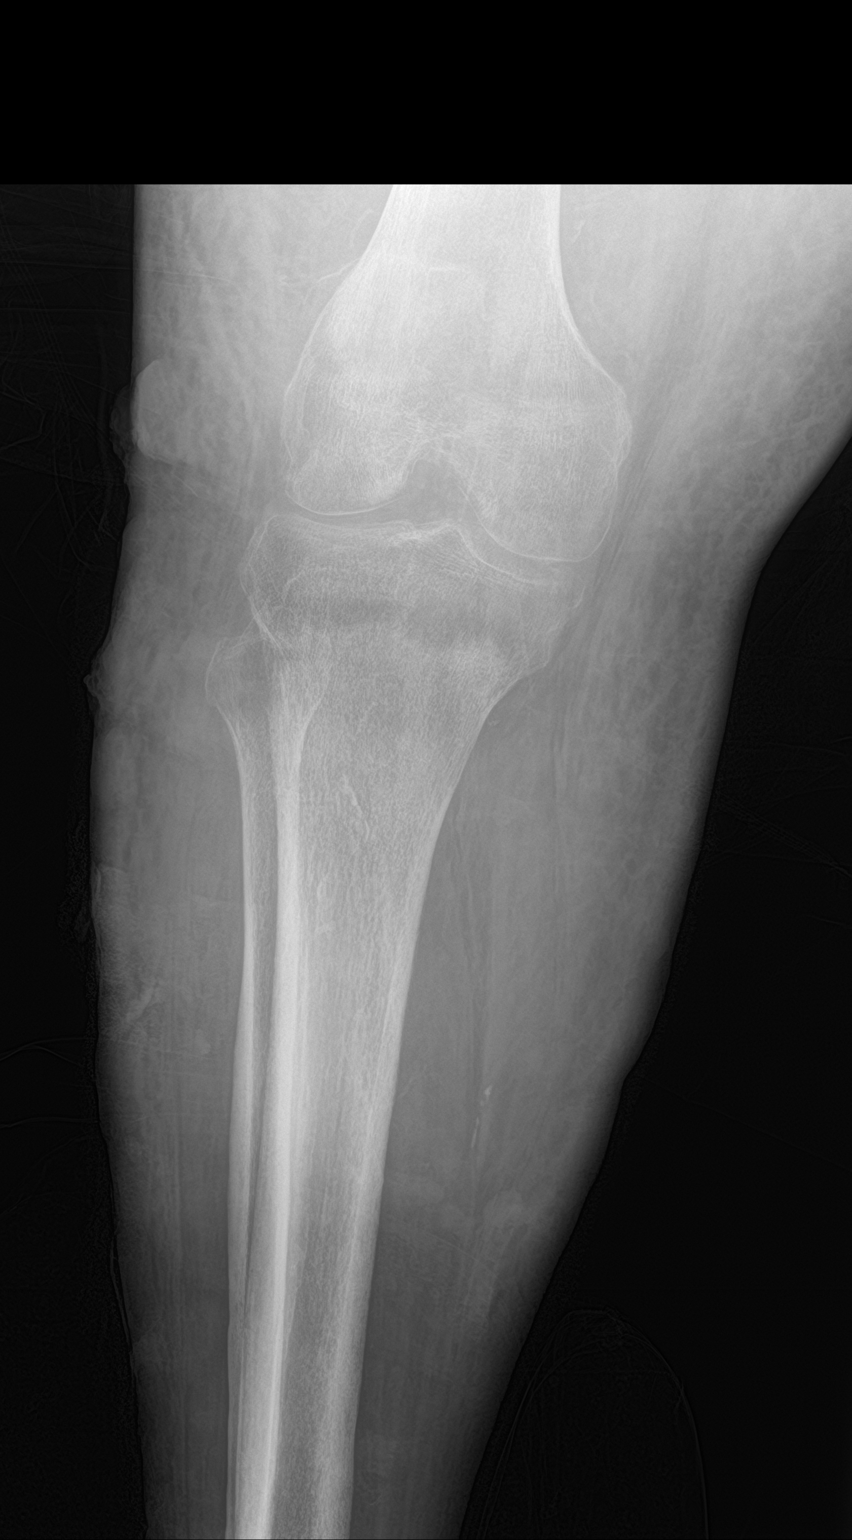

[tibia ap (2 of 2)]
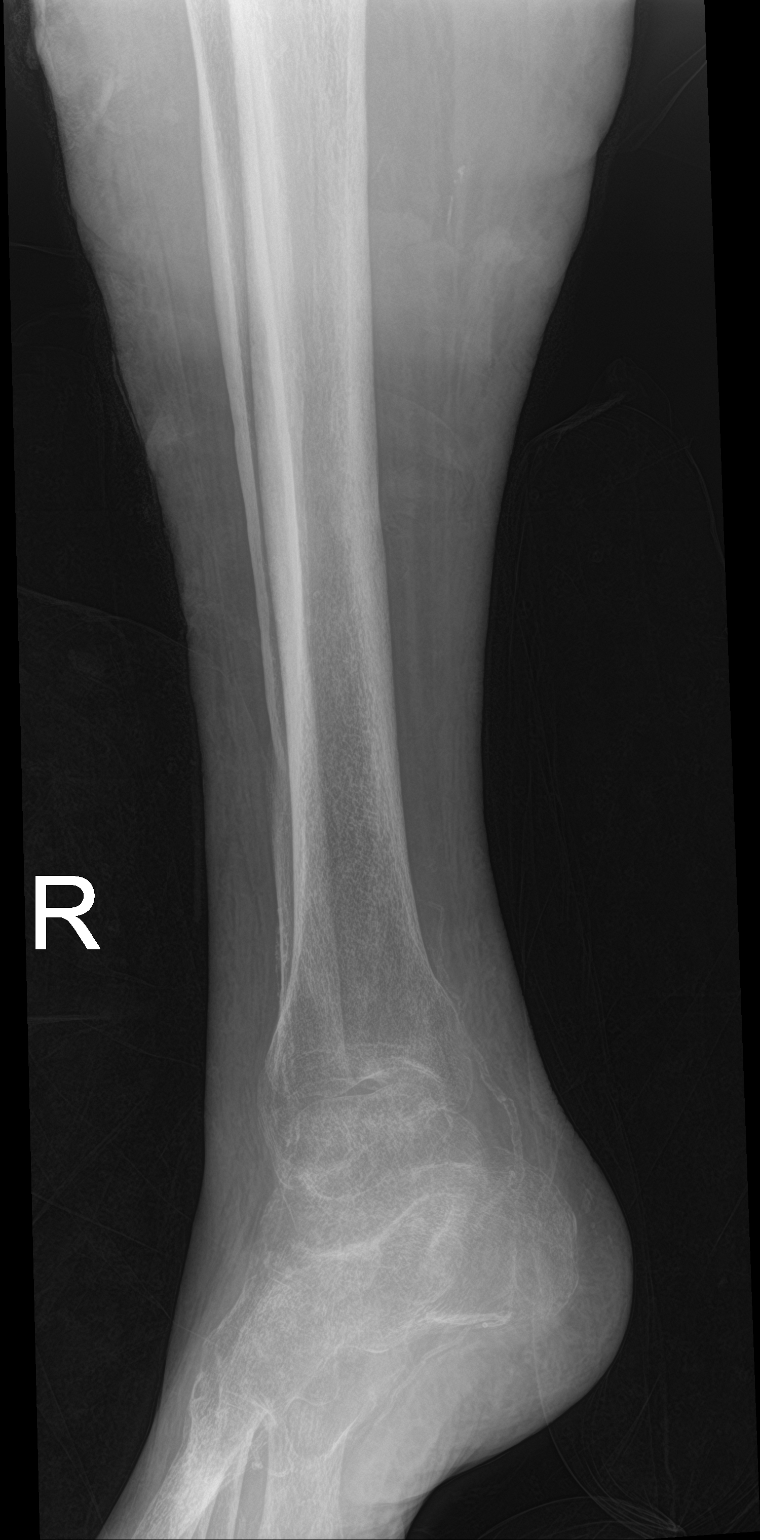

[tibia lat (1 of 2)]
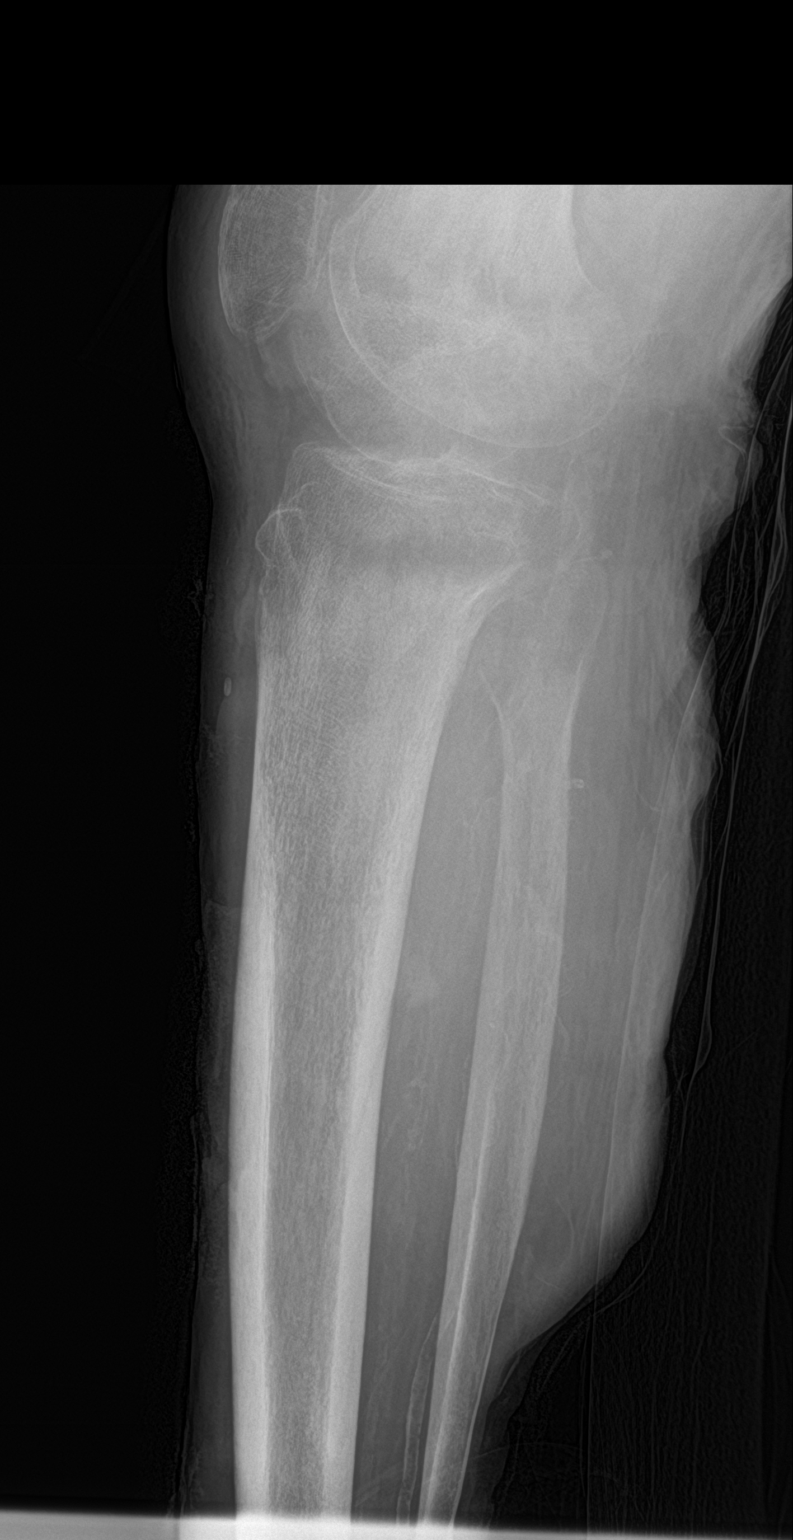

[tibia lat (2 of 2)]
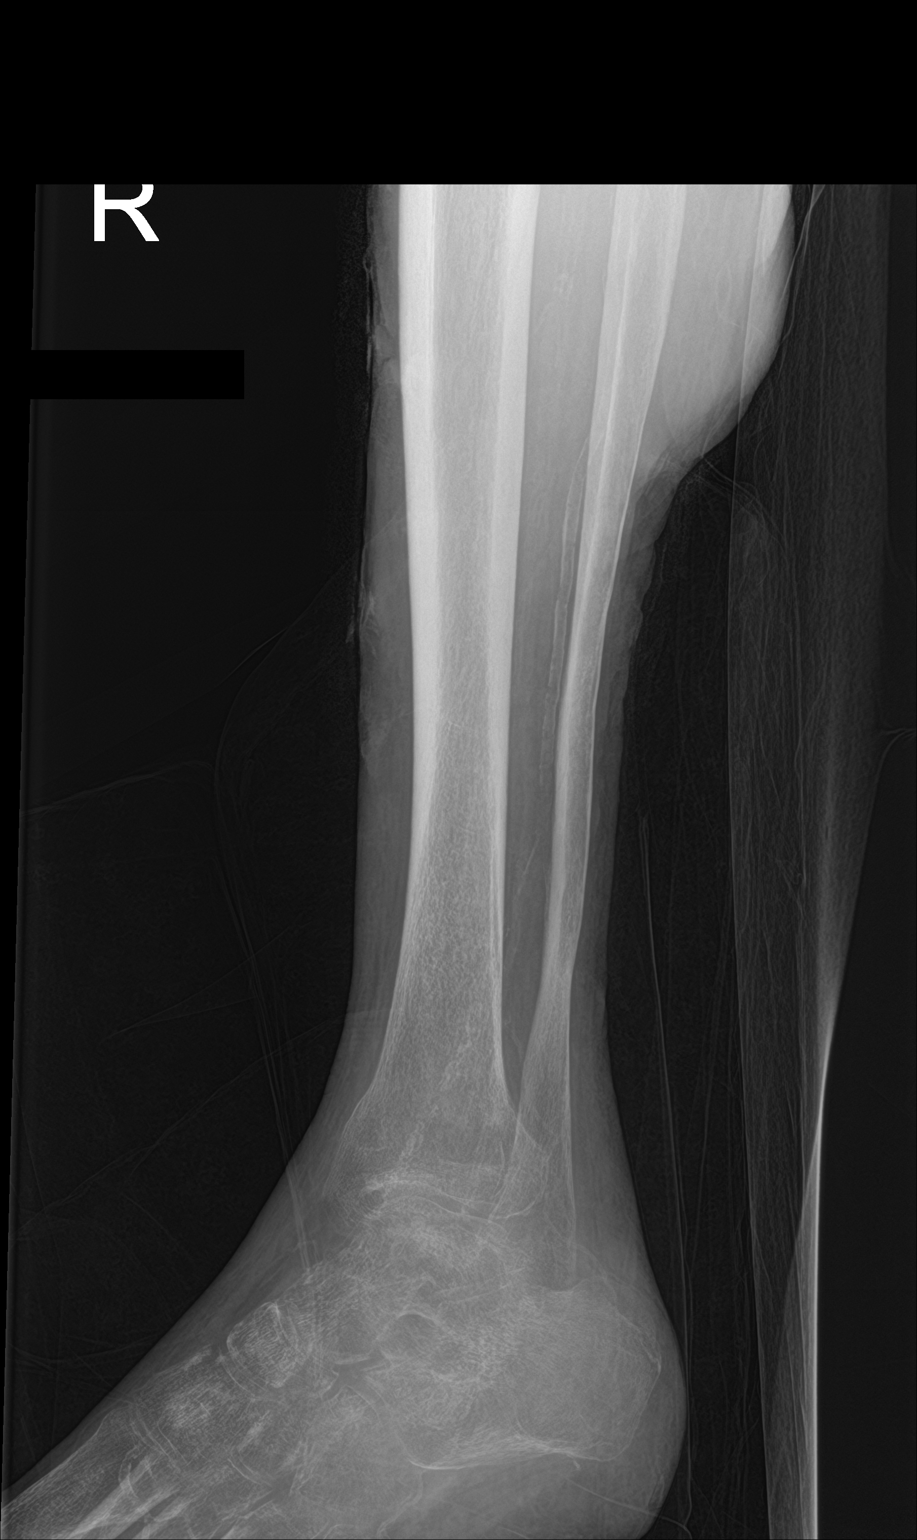

[4 of 4 positions shown; findings below may reference images not displayed]

FINDINGS: Frontal and lateral views of the right tibia and fibula are
obtained. Bones are diffusely osteopenic. There are no acute or
destructive bony lesions. Osteoarthritis of the right knee, ankle,
and hindfoot. Diffuse subcutaneous edema. No radiographic evidence
of osteomyelitis.
IMPRESSION: 1. Diffuse subcutaneous edema.
2. Osteopenia.  No acute or destructive bony lesions.

## 2018-09-13 IMAGING — CR DG TIBIA/FIBULA 2V*L*
4 series · 4 of 4 positions shown · non-contrast
Comparison: None.

CLINICAL DATA: Drainage, suspected sepsis

EXAM:
LEFT TIBIA AND FIBULA - 2 VIEW

[tibia ap (1 of 2)]
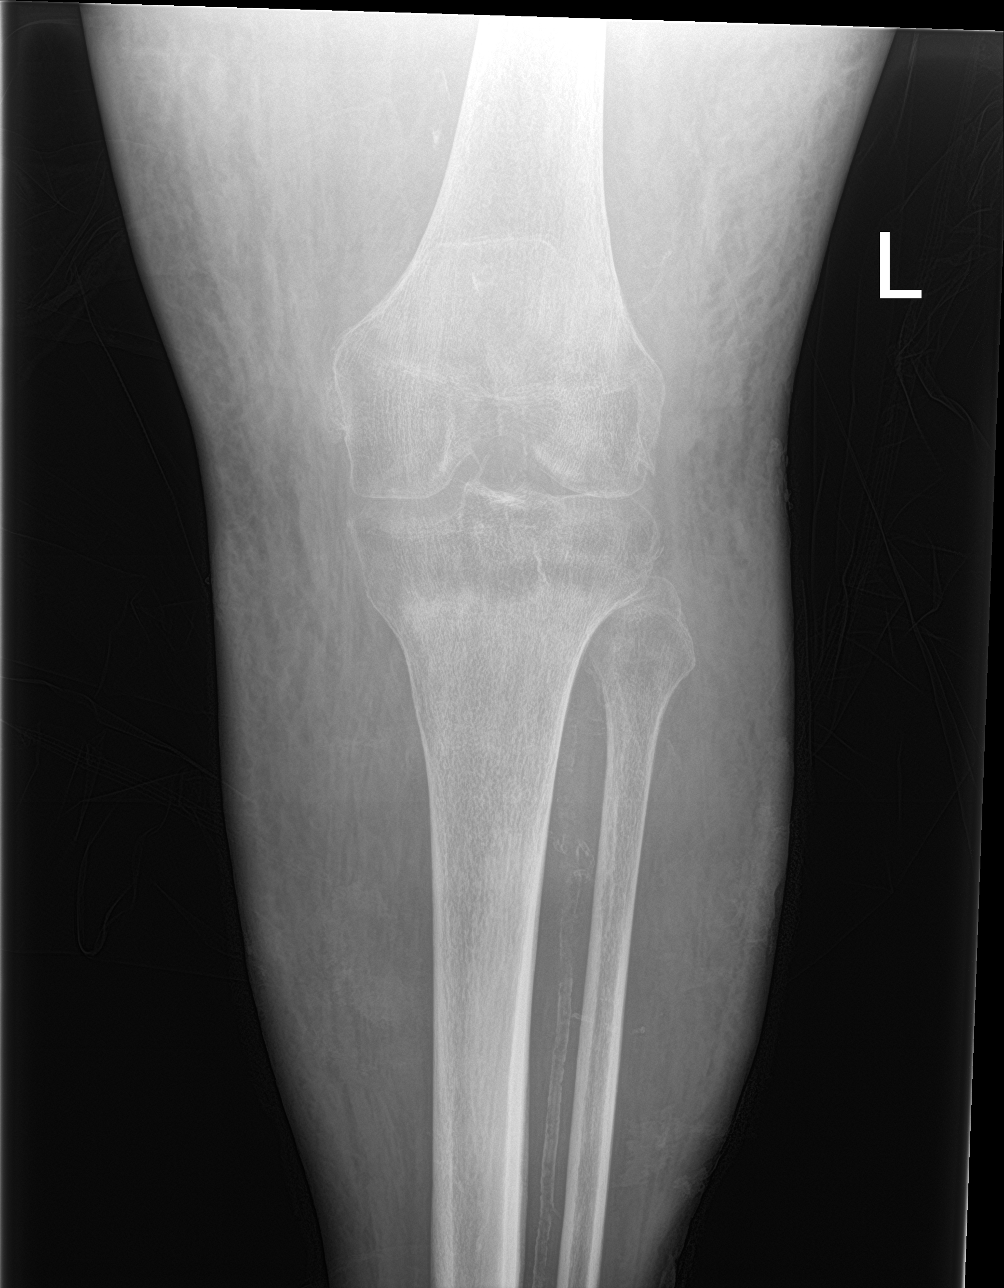

[tibia ap (2 of 2)]
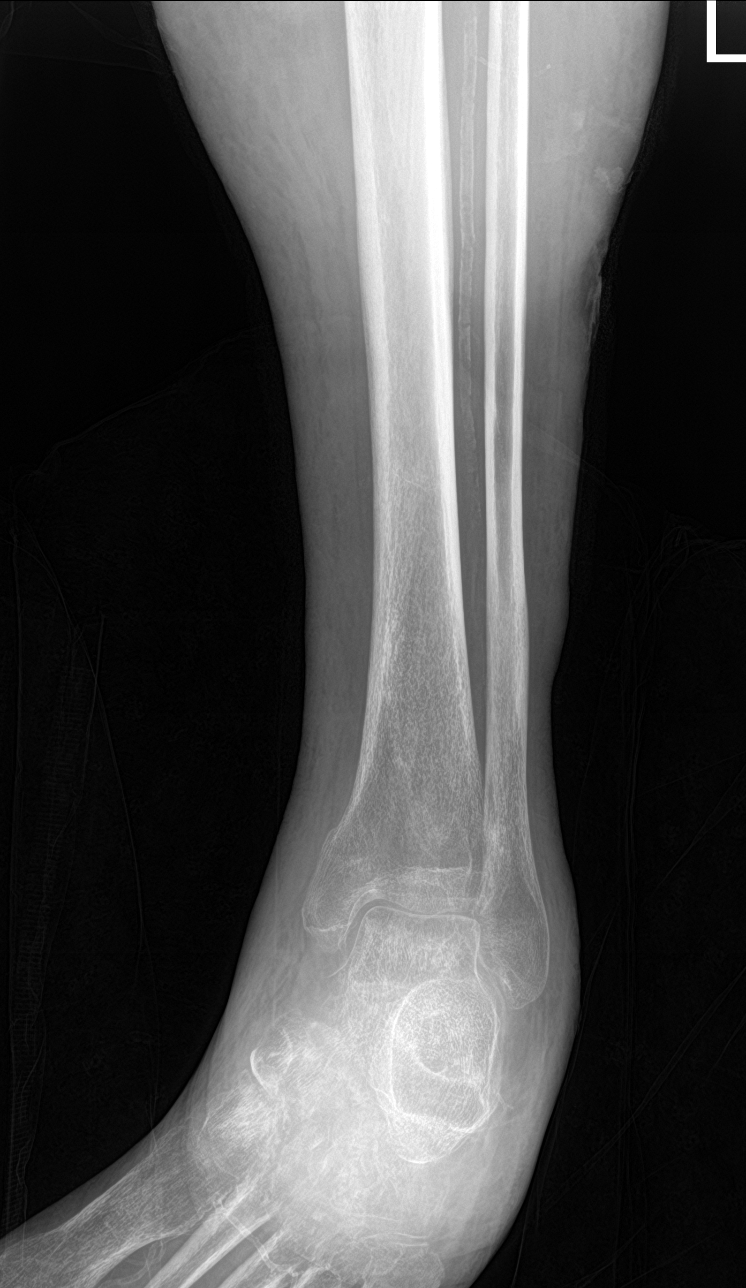

[tibia lat (1 of 2)]
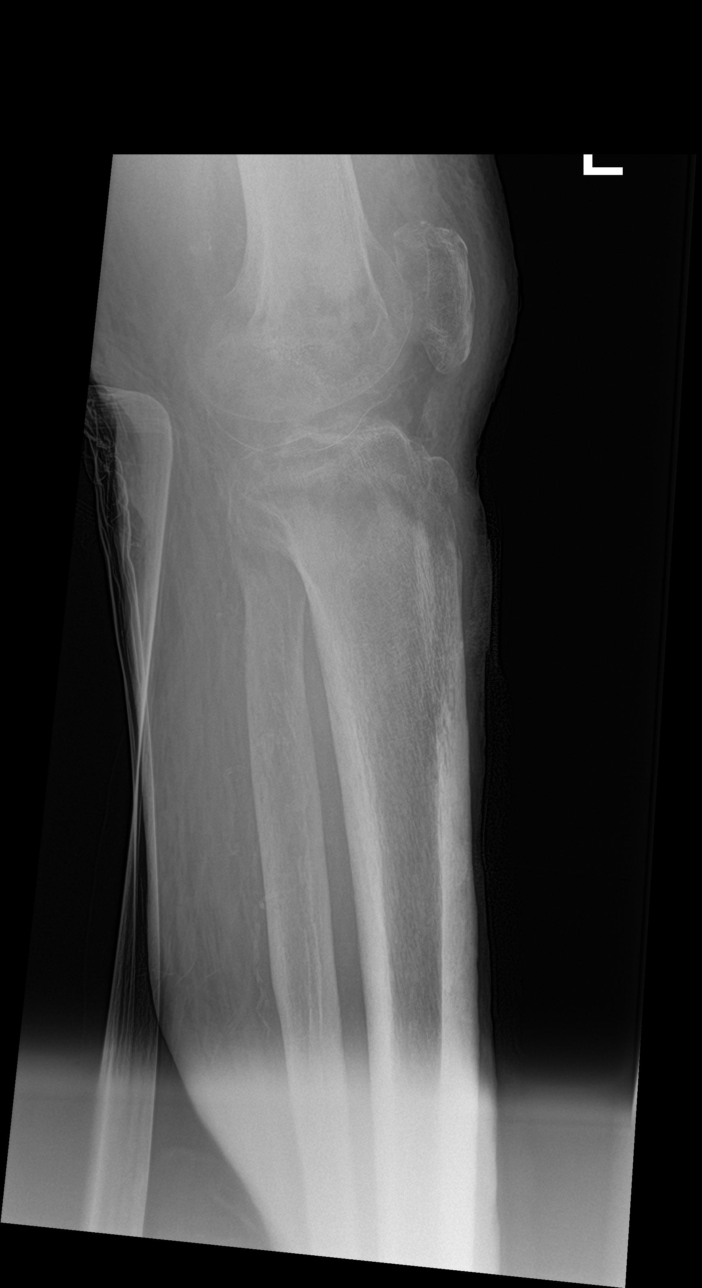

[tibia lat (2 of 2)]
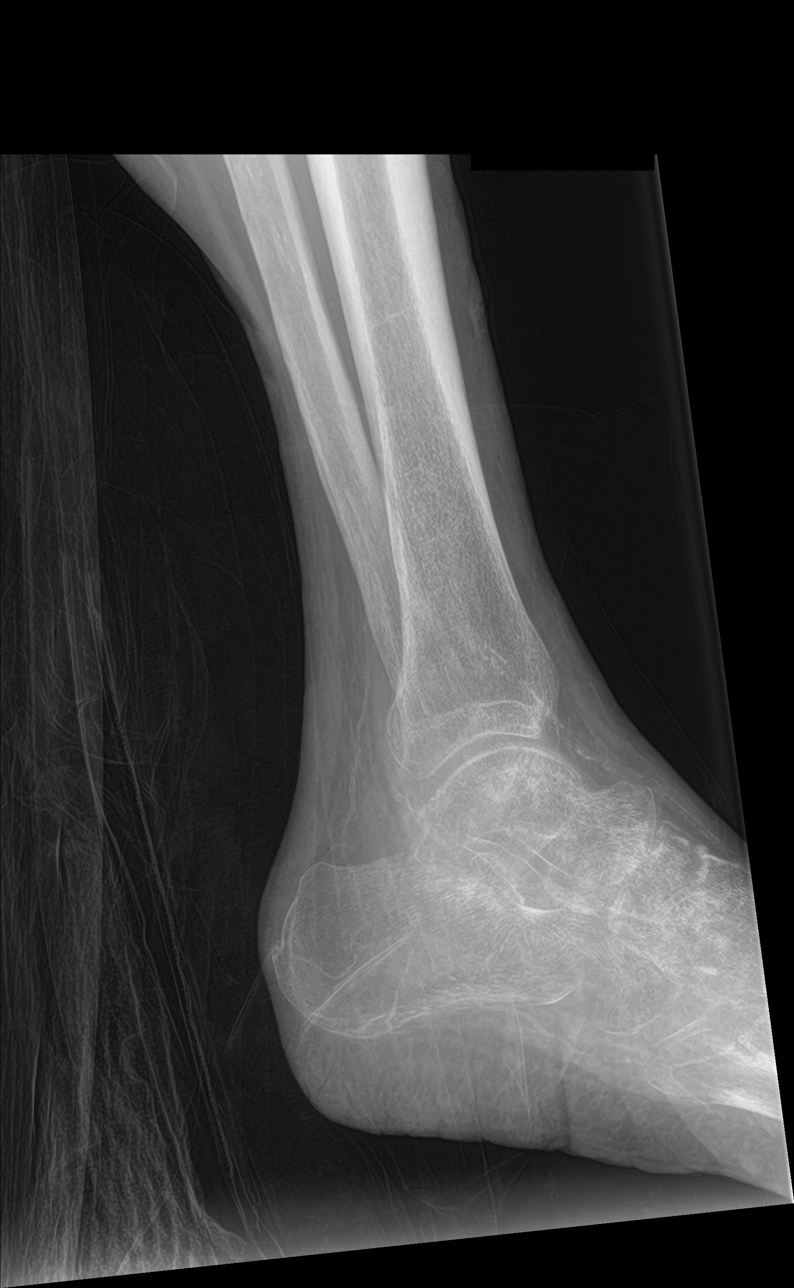

[4 of 4 positions shown; findings below may reference images not displayed]

FINDINGS: Frontal and lateral views of the left tibia and fibula are obtained.
Bones are diffusely osteopenic. There are no acute or destructive
bony lesions. Mild osteoarthritis of the left knee, ankle, and
hindfoot. There is diffuse subcutaneous edema.
IMPRESSION: 1. Diffuse subcutaneous edema.
2. Osteopenia.  No acute or destructive bony abnormality.

## 2018-10-02 ENCOUNTER — Encounter (HOSPITAL_BASED_OUTPATIENT_CLINIC_OR_DEPARTMENT_OTHER): Payer: Medicare Other | Attending: Internal Medicine

## 2018-10-02 DIAGNOSIS — L89322 Pressure ulcer of left buttock, stage 2: Secondary | ICD-10-CM | POA: Insufficient documentation

## 2018-10-02 DIAGNOSIS — I10 Essential (primary) hypertension: Secondary | ICD-10-CM | POA: Diagnosis not present

## 2018-10-02 DIAGNOSIS — G8220 Paraplegia, unspecified: Secondary | ICD-10-CM | POA: Diagnosis not present

## 2018-10-02 DIAGNOSIS — L97812 Non-pressure chronic ulcer of other part of right lower leg with fat layer exposed: Secondary | ICD-10-CM | POA: Diagnosis not present

## 2018-10-02 DIAGNOSIS — L97829 Non-pressure chronic ulcer of other part of left lower leg with unspecified severity: Secondary | ICD-10-CM | POA: Diagnosis not present

## 2018-10-02 DIAGNOSIS — S61401A Unspecified open wound of right hand, initial encounter: Secondary | ICD-10-CM | POA: Diagnosis not present

## 2018-10-02 DIAGNOSIS — E11622 Type 2 diabetes mellitus with other skin ulcer: Secondary | ICD-10-CM | POA: Insufficient documentation

## 2018-10-02 DIAGNOSIS — X58XXXA Exposure to other specified factors, initial encounter: Secondary | ICD-10-CM | POA: Diagnosis not present

## 2018-12-02 ENCOUNTER — Other Ambulatory Visit: Payer: Self-pay

## 2018-12-02 ENCOUNTER — Encounter (HOSPITAL_BASED_OUTPATIENT_CLINIC_OR_DEPARTMENT_OTHER): Payer: Medicare Other | Attending: Internal Medicine | Admitting: Internal Medicine

## 2018-12-02 DIAGNOSIS — S61401D Unspecified open wound of right hand, subsequent encounter: Secondary | ICD-10-CM | POA: Insufficient documentation

## 2018-12-02 DIAGNOSIS — L89322 Pressure ulcer of left buttock, stage 2: Secondary | ICD-10-CM | POA: Diagnosis not present

## 2018-12-02 DIAGNOSIS — L97811 Non-pressure chronic ulcer of other part of right lower leg limited to breakdown of skin: Secondary | ICD-10-CM | POA: Diagnosis not present

## 2018-12-02 DIAGNOSIS — G8250 Quadriplegia, unspecified: Secondary | ICD-10-CM | POA: Insufficient documentation

## 2018-12-02 DIAGNOSIS — E11622 Type 2 diabetes mellitus with other skin ulcer: Secondary | ICD-10-CM | POA: Diagnosis not present

## 2018-12-02 DIAGNOSIS — L97213 Non-pressure chronic ulcer of right calf with necrosis of muscle: Secondary | ICD-10-CM | POA: Diagnosis present

## 2018-12-02 DIAGNOSIS — X58XXXD Exposure to other specified factors, subsequent encounter: Secondary | ICD-10-CM | POA: Insufficient documentation

## 2018-12-23 NOTE — Progress Notes (Addendum)
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 they offered a good alternative. The patient has not been back. 3. Considerable improvement in the major wound areas which were the bilateral anterior tibial areas of the lower legs and the dorsal right hand and wrist Electronic Signature(s) Signed: 12/04/2018 6:25:25 PM By: Linton Ham MD Signed: 12/08/2018 2:18:43 PM By: Levan Hurst RN, BSN Previous Signature: 12/02/2018 6:07:35 PM Version By: Linton Ham MD Entered By: Levan Hurst on 12/04/2018 17:24:03 -------------------------------------------------------------------------------- SuperBill Details Patient Name: Date of Service: [name] [name], [name] [name] 12/02/2018 Medical Record [id number] Patient Account Number: [id number] Date of Birth/Sex: Treating RN: 02/21/1945 (73 y.o. 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 S61.401D Unspecified open wound of right hand, subsequent encounter L89.322 Pressure ulcer of left buttock, stage 2 Facility Procedures CPT4 Code: [id number] Description: [id number] - WOUND CARE VISIT-LEV 5 EST PT Modifier: Quantity: 1 Physician Procedures CPT4 Code Description: [id number] - WC PHYS LEVEL 3 - EST PT ICD-10 Diagnosis Description L97.213 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

## 2018-12-23 NOTE — Progress Notes (Signed)
SHAQUON, GROPP ([id number]) Visit Report for 12/02/2018 Arrival Information Details Patient Name: Date of Service: [name] [name], [name] [name] 12/02/2018 10:45 AM Medical Record [id number] Patient Account Number: [id number] Date of Birth/Sex: Treating RN: [birth year] (73 y.o. [name] Primary Care Shameria Trimarco: Beckie Salts Other Clinician: Referring Arienne Gartin: Treating Jaye Polidori/Extender:Robson, Precious Haws, MIKE Weeks in Treatment: 91 Visit Information History Since Last Visit Added or deleted any medications: No Patient Arrived: Stretcher Any new allergies or adverse reactions: No Arrival Time: 12:04 Had a fall or experienced change in No Accompanied By: dgt activities of daily living that may affect Transfer Assistance: Stretcher risk of falls: Patient Identification Verified: Yes Signs or symptoms of abuse/neglect since last No Secondary Verification Process Yes visito Completed: Hospitalized since last visit: No Patient Requires Transmission- No Implantable device outside of the clinic excluding No Based Precautions: cellular tissue based products placed in the center Patient Has Alerts: Yes since last visit: Patient Alerts: R ABI non Has Dressing in Place as Prescribed: Yes compressible Pain Present Now: Yes Electronic Signature(s) Signed: 12/03/2018 5:42:21 PM By: Baruch Gouty RN, BSN Entered By: Baruch Gouty on 12/02/2018 12:07:18 -------------------------------------------------------------------------------- Clinic Level of Care Assessment Details Patient Name: Date of Service: [name] [name], [name] [name] 12/02/2018 10:45 AM Medical Record [id number] Patient Account Number: [id number] Date of Birth/Sex: Treating RN: Jan 18, 1945 (73 y.o. Jerilynn Mages) Carlene Coria Primary Care Sariya Trickey: Beckie Salts Other Clinician: Referring Nazli Penn: Treating Tyon Cerasoli/Extender:Robson, Precious Haws, MIKE Weeks in Treatment: 48 Clinic Level of Care Assessment Items TOOL 4  Quantity Score X - Use when only an EandM is performed on FOLLOW-UP visit 1 0 ASSESSMENTS - Nursing Assessment / Reassessment X - Reassessment of Co-morbidities (includes updates in patient status) 1 10 X - Reassessment of Adherence to Treatment Plan 1 5 ASSESSMENTS - Wound and Skin Assessment / Reassessment _0  - Simple Wound Assessment / Reassessment - one wound 0 X - Complex Wound Assessment / Reassessment - multiple wounds 6 5 _1  - Dermatologic / Skin Assessment (not related to wound area) 0 ASSESSMENTS - Focused Assessment _2  - Circumferential Edema Measurements - multi extremities 0 _3  - Nutritional Assessment / Counseling / Intervention 0 _4  - Lower Extremity Assessment (monofilament, tuning fork, pulses) 0 _5  - Peripheral Arterial Disease Assessment (using hand held doppler) 0 ASSESSMENTS - Ostomy and/or Continence Assessment and Care _6  - Incontinence Assessment and Management 0 _7  - Ostomy Care Assessment and Management (repouching, etc.) 0 PROCESS - Coordination of Care X - Simple Patient / Family Education for ongoing care 1 15 _8  - Complex (extensive) Patient / Family Education for ongoing care 0 X - Staff obtains Programmer, systems, Records, Test Results / Process Orders 1 10 _9  - Staff telephones HHA, Nursing Homes / Clarify orders / etc 0 _10  - Routine Transfer to another Facility (non-emergent condition) 0 _11  - Routine Hospital Admission (non-emergent condition) 0 _12  - New Admissions / Biomedical engineer / Ordering NPWT, Apligraf, etc. 0 _13  - Emergency Hospital Admission (emergent condition) 0 X - Simple Discharge Coordination 1 10 _14  - Complex (extensive) Discharge Coordination 0 PROCESS - Special Needs _15  - Pediatric / Minor Patient Management 0 _16  - Isolation Patient Management 0 _17  - Hearing / Language / Visual special needs 0 _18  - Assessment of Community assistance (transportation, D/C planning, etc.) 0 _19  - Additional assistance / Altered mentation 0 _20  - Support  Surface(s) Assessment (bed, cushion, seat, etc.) 0 INTERVENTIONS - Wound Cleansing / Measurement _21  - Simple Wound Cleansing - one wound 0 X - Complex Wound Cleansing - multiple wounds 6 5  X - Wound Imaging (photographs - any number of wounds) 1 5 _0  - Wound Tracing (instead of photographs) 0 _1  - Simple Wound Measurement - one wound 0 X - Complex Wound Measurement - multiple wounds 6 5 INTERVENTIONS - Wound Dressings _2  - Small Wound Dressing one or multiple wounds 0 X - Medium Wound Dressing one or multiple wounds 2 15 X - Large Wound Dressing one or multiple wounds 4 20 <[id number]>_6<\/TKPTWSFKCLEXNTZG>_0  - Application of Medications - topical 0 <FVCBSWHQPRFFMBWG>_6<\/KZLDJTTSVXBLTJQZ>_0  - Application of Medications - injection 0 INTERVENTIONS - Miscellaneous _5  - External ear exam 0 _6  - Specimen Collection (cultures, biopsies, blood, body fluids, etc.) 0 _7  - Specimen(s) / Culture(s) sent or taken to Lab for analysis 0 _8  - Patient Transfer (multiple staff / Civil Service fast streamer / Similar devices) 0 _9  - Simple Staple / Suture removal (25 or less) 0 _10  - Complex Staple / Suture removal (26 or more) 0 _11  - Hypo / Hyperglycemic Management (close monitor of Blood Glucose) 0 _12  - Ankle / Brachial Index (ABI) - do not check if billed separately 0 X - Vital Signs 1 5 Has the patient been seen at the hospital within the last three years: Yes Total Score: 260 Level Of Care: New/Established - Level 5 Electronic Signature(s) Signed: 12/23/2018 2:50:47 PM By: Carlene Coria RN Entered By: Carlene Coria on 12/02/2018 13:33:51 -------------------------------------------------------------------------------- Encounter Discharge Information Details Patient Name: Date of Service: BOND, GRIESHOP 12/02/2018 10:45 AM Medical Record [id number] Patient Account Number: [id number] Date of Birth/Sex: Treating RN: 1945/08/14 (73 y.o. [name] Primary Care Ramiya Delahunty: Beckie Salts Other Clinician: Referring Gardiner Espana: Treating Lousie Calico/Extender:Robson, Precious Haws,  MIKE Weeks in Treatment: 75 Encounter Discharge Information Items Discharge Condition: Stable Ambulatory Status: Stretcher Discharge Destination: Home Transportation: Private Auto Accompanied By: daughter Schedule Follow-up Appointment: Yes Clinical Summary of Care: Patient Declined Electronic Signature(s) Signed: 12/08/2018 2:18:43 PM By: Levan Hurst RN, BSN Entered By: Levan Hurst on 12/02/2018 18:09:20 -------------------------------------------------------------------------------- Lower Extremity Assessment Details Patient Name: Date of Service: MAXIMILIEN, HAYASHI 12/02/2018 10:45 AM Medical Record [id number] Patient Account Number: [id number] Date of Birth/Sex: Treating RN: 12/07/1945 (73 y.o. [name] Primary Care Samyiah Halvorsen: Beckie Salts Other Clinician: Referring Jonathandavid Marlett: Treating Larue Drawdy/Extender:Robson, Precious Haws, MIKE Weeks in Treatment: 48 Edema Assessment Assessed: [Left: No] [Right: No] Edema: [Left: No] [Right: No] Calf Left: Right: Point of Measurement: cm From Medial Instep 32.8 cm 30 cm Ankle Left: Right: Point of Measurement: cm From Medial Instep 17 cm 16.9 cm Vascular Assessment Pulses: Dorsalis Pedis Palpable: [Left:Yes] [Right:Yes] Electronic Signature(s) Signed: 12/03/2018 5:42:21 PM By: Baruch Gouty RN, BSN Entered By: Baruch Gouty on 12/02/2018 12:27:47 -------------------------------------------------------------------------------- Multi Wound Chart Details Patient Name: Date of Service: [name] [name], [name] [name] 12/02/2018 10:45 AM Medical Record [id number] Patient Account Number: [id number] Date of Birth/Sex: Treating RN: December 01, 1945 (73 y.o. 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] [13:Atypical] Comorbid History: [1:Anemia, Hypertension, TypeAnemia, Hypertension, TypeAnemia, Hypertension, Type II Diabetes, Paraplegia] [12:II Diabetes, Paraplegia II Diabetes, Paraplegia] Date Acquired: [1:11/15/2017] [12:09/19/2018] [13:12/02/2018] Weeks of Treatment: [1:48] [12:8] [13:0] Wound Status: [1:Open] [12:Open] [13:Open] Clustered Wound: [1:Yes] [12:No] [13:No] Clustered Quantity: [1:10] [12:N/A] [13:N/A] Measurements L  x W x D 10.5x6.5x0.1 [12:0.8x1.2x0.1] [13:2.4x2.5x0.1] (cm) Area (cm) : [1:53.603] [12:0.754] [13:4.712] Volume (cm) : [1:5.36] [12:0.075] [13:0.471] % Reduction in Area: [1:-68.10%] [12:92.50%] [13:N/A] % Reduction in Volume: -68.10% [12:92.50%] [13:N/A] Classification: [1:Full Thickness Without Exposed Support Structures] [12:Category/Stage II] [13:Full Thickness Without Exposed Support Structures] Exudate Amount: [1:Medium] [12:Small] [13:Small] Exudate Type: [1:Purulent] [12:Serosanguineous] [13:Serosanguineous] Exudate Color: [1:yellow, brown, green] [12:red, brown] [13:red, brown] Wound Margin: [1:Flat and Intact] [12:Flat and Intact] [13:Flat and Intact] Granulation Amount: [1:Large (67-100%)] [12:Large (67-100%)] [13:Large (67-100%)] Granulation Quality: [1:Red] [12:Red] [13:Red, Hyper-granulation] Necrotic Amount: [1:None Present (0%)] [12:None Present (0%)] [13:None Present (0%)] Exposed Structures: [1:Fat Layer (Subcutaneous Fat Layer (Subcutaneous Fat Layer (Subcutaneous Tissue) Exposed: Yes Fascia: No Tendon: No Muscle: No Joint: No Bone: No] [12:Tissue) Exposed: Yes Fascia: No Tendon: No Muscle: No  Joint: No Bone: No] [13:Tissue) Exposed: Yes  Fascia: No Tendon: No Muscle: No Joint: No Bone: No] Epithelialization: [1:Medium (34-66%)] [12:Medium (34-66%) 2 5] [13:Large (67-100%)] Photos: [1:No Photos] [12:No Photos] [13:No Photos] Wound Location: [1:Right Hand - 2nd Digit -Right Lower Leg - Anterior Right Lower Leg - Lateral, Lateral] [13:Posterior] Wounding Event: [1:Gradually Appeared] [12:Gradually Appeared] [13:Gradually Appeared] Primary Etiology: [1:Atypical] [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] [13:Lower Extremity] Comorbid History: [1:Anemia, Hypertension, TypeAnemia, Hypertension, TypeAnemia, Hypertension, Type II Diabetes, Paraplegia] [12:II Diabetes, Paraplegia] [13:II Diabetes, Paraplegia] Date Acquired: [1:11/15/2017] [12:11/15/2017] [13:11/15/2017] Weeks of Treatment: [1:48] [12:48] [13:48] Wound Status: [1:Healed - Epithelialized] [12:Open] [13:Open] Clustered Wound: [1:No] [12:Yes] [13:Yes] Clustered Quantity: [1:N/A] [12:9] [13:5] Measurements L x W x D 0x0x0 [12:8.5x3x0.1] [13:10x4.5x0.1] (cm) Area (cm) : [1:0] [12:20.028] [13:35.343] Volume (cm) : [1:0] [12:2.003] [13:3.534] % Reduction in Area: [1:100.00%] [12:82.60%] [13:-30.70%] % Reduction in Volume: 100.00% [12:82.60%] [13:-30.60%] Classification: [1:Full Thickness Without Exposed Support Structures] [12:Grade 2] [13:Grade 2] Exudate Amount: [1:None Present] [12:Small] [13:Small] Exudate Type: [1:N/A] [12:Serosanguineous] [13:Serosanguineous] Exudate Color: [1:N/A] [12:red, brown] [13:red, brown] Wound Margin: [1:Flat and Intact] [12:Flat and Intact] [13:Flat and Intact] Granulation Amount: [1:None Present (0%)] [12:Medium (34-66%)] [13:Large (67-100%)] Granulation Quality: [1:N/A] [12:Red, Hyper-granulation, Friable] [13:Red, Hyper-granulation, Friable] Necrotic Amount: [1:None Present (0%)] [12:None Present (0%)] [13:None Present (0%)] Exposed Structures: [1:Fascia: No  Fat Layer (Subcutaneous Tissue) Exposed: Yes Tissue) Exposed: No Tendon: No Muscle: No Joint: No Bone: No] [12:Fat Layer (Subcutaneous Fat Layer (Subcutaneous Fascia: No Tendon: No Muscle: No Joint: No Bone: No] [13:Tissue) Exposed: Yes  Fascia: No Tendon: No Muscle: No Joint: No Bone: No] Epithelialization: [1:Large (67-100%)] [12:Large (67-100%) 8 N/A] [13:Medium (34-66%)] Photos: [1:No Photos] [12:N/A] [13:N/A] Wound Location: [1:Left Lower Leg - Anterior] [12:N/A] [13:N/A] Wounding Event: [1:Gradually Appeared] [12:N/A] [13:N/A] Primary Etiology: [1:Diabetic Wound/Ulcer of the N/A Lower Extremity] [13:N/A] Comorbid History: [1:Anemia, Hypertension, TypeN/A II Diabetes, Paraplegia] [13:N/A] Date Acquired: [1:11/15/2017] [12:N/A] [13:N/A] Weeks of Treatment: [1:48] [12:N/A] [13:N/A] Wound Status: [1:Open] [12:N/A] [13:N/A] Clustered Wound: [1:Yes] [12:N/A] [13:N/A] Clustered Quantity: [1:10] [12:N/A] [13:N/A] Measurements L x W x D 21.5x0.6x0.1 [12:N/A] [13:N/A] (cm) Area (cm) : [1:10.132] [12:N/A] Volume (cm) : [1:1.013] [12:N/A] % Reduction in Area: [1:50.30%] [12:N/A] [13:N/A] % Reduction in Volume: [1:50.30%] [12:N/A] [13:N/A] Classification: [1:Grade 2] [12:N/A] [13:N/A] Exudate Amount: [1:Small] [12:N/A] [13:N/A] Exudate Type: [1:Serosanguineous] [12:N/A] [13:N/A] Exudate Color: [1:red, brown] [12:N/A] [13:N/A] Wound Margin: [1:Flat and Intact] [12:N/A] [13:N/A] Granulation Amount: [1:Large (67-100%)] [12:N/A] [13:N/A] Granulation Quality: [1:Red, Hyper-granulation, Friable] [12:N/A] [13:N/A] Necrotic Amount: [1:None Present (0%)] [12:N/A] [13:N/A] Exposed Structures: [1:Fat Layer (Subcutaneous Tissue) Exposed: Yes Fascia: No Tendon: No Muscle: No Joint: No Bone: No Large (67-100%)] [12:N/A N/A] [13:N/A N/A] Treatment Notes Electronic  Signature(s) Signed: 12/02/2018 6:07:35 PM By: Linton Ham MD Signed: 12/23/2018 2:50:47 PM By: Carlene Coria RN Entered By: Linton Ham  on 12/02/2018 14:46:10 -------------------------------------------------------------------------------- Multi-Disciplinary Care Plan Details Patient Name: Date of Service: [name] [name], [name] [name] 12/02/2018 10:45 AM Medical Record [id number] Patient Account Number: [id number] Date of Birth/Sex: Treating RN: 22-Dec-1945 (73 y.o. Jerilynn Mages) Carlene Coria Primary Care Abbygayle Helfand: Beckie Salts Other Clinician: Referring Jarely Juncaj: Treating Martisha Toulouse/Extender:Robson, Precious Haws, MIKE Weeks in Treatment: 18 Active Inactive Abuse / Safety / Falls / Self Care Management Nursing Diagnoses: Impaired physical mobility Potential for injury related to transfers Goals: Patient will not develop complications from immobility Date Initiated: 12/31/2017 Date Inactivated: 02/10/2018 Target Resolution Date: 01/31/2018 Goal Status: Met Patient/caregiver will verbalize understanding of skin care regimen Date Initiated: 12/31/2017 Target Resolution Date: 12/12/2018 Goal Status: Active Patient/caregiver will verbalize/demonstrate measures taken to prevent injury and/or falls Date Initiated: 12/31/2017 Date Inactivated: 02/10/2018 Target Resolution Date: 01/31/2018 Goal Status: Met Interventions: Assess Activities of Daily Living upon admission and as needed Assess fall risk on admission and as needed Assess: immobility, friction, shearing, incontinence upon admission and as needed Assess impairment of mobility on admission and as needed per policy Assess personal safety and home safety (as indicated) on admission and as needed Assess self care needs on admission and as needed Notes: Electronic Signature(s) Signed: 12/23/2018 2:50:47 PM By: Carlene Coria RN Entered By: Carlene Coria on 12/02/2018 13:08:04 -------------------------------------------------------------------------------- Pain Assessment Details Patient Name: Date of Service: ZAYDEN, MAFFEI 12/02/2018 10:45 AM Medical Record [id number] Patient  Account Number: [id number] Date of Birth/Sex: Treating RN: 07/28/1945 (73 y.o. [name] Primary Care Naje Rice: Beckie Salts Other Clinician: Referring Prudy Candy: Treating Branston Halsted/Extender:Robson, Precious Haws, MIKE Weeks in Treatment: 5 Active Problems Location of Pain Severity and Description of Pain Patient Has Paino Yes Site Locations Pain Location: Pain in Ulcers With Dressing Change: Yes Duration of the Pain. Constant / Intermittento Intermittent Rate the pain. Current Pain Level: 7 Character of Pain Describe the Pain: Aching Pain Management and Medication Current Pain Management: Medication: Yes Other: reposition Is the Current Pain Management Adequate: Adequate How does your wound impact your activities of daily livingo Sleep: No Bathing: No Appetite: No Relationship With Others: No Bladder Continence: No Emotions: No Bowel Continence: No Hobbies: No Toileting: No Dressing: No Electronic Signature(s) Signed: 12/03/2018 5:42:21 PM By: Baruch Gouty RN, BSN Entered By: Baruch Gouty on 12/02/2018 12:08:30 -------------------------------------------------------------------------------- Patient/Caregiver Education Details Patient Name: Christy Gentles 11/17/2020andnbsp10:45 Date of Service: AM Medical Record [id number] Number: Patient Account Number: [id number] Treating RN: Date of Birth/Gender: 10-Nov-1945 (73 y.o. Oval Linsey) Other Clinician: Primary Care Physician: Beckie Salts Treating Linton Ham Referring Physician: Physician/Extender: Darnelle Bos in Treatment: 57 Education Assessment Education Provided To: Patient Education Topics Provided Wound/Skin Impairment: Methods: Explain/Verbal Responses: State content correctly Electronic Signature(s) Signed: 12/23/2018 2:50:47 PM By: Carlene Coria RN Entered By: Carlene Coria on 12/02/2018  13:08:24 -------------------------------------------------------------------------------- Wound Assessment Details Patient Name: Date of Service: KEVAUGHN, EWING 12/02/2018 10:45 AM Medical Record [id number] Patient Account Number: [id number] Date of Birth/Sex: Treating RN: 1946/01/12 (73 y.o. [name] Primary Care [place] Kielbasa: Beckie Salts Other Clinician: Referring Cullen Lahaie: Treating Aizik Reh/Extender:Robson, Precious Haws, MIKE Weeks in Treatment: 48 Wound Status Wound Number: 1 Primary Atypical Etiology: Wound Location: Right Hand - Dorsum Wound Open Wounding Event: Gradually Appeared Status: Date Acquired: 11/15/2017 Comorbid Anemia, Hypertension, Type II Diabetes, Weeks Of Treatment: 48 History: Paraplegia Clustered Wound: Yes Photos Wound Measurements Length: (cm) 10.5 % Reduc Width: (cm) 6.5 % Reduc Depth: (cm) 0.1 Epithel  Clustered Quantity: 10 Tunneli Area: (cm) 53.603 Underm Volume: (cm) 5.36 Wound Description Classification: Full Thickness Without Exposed Support Foul Od Structures Slough/ Wound Flat and Intact Margin: Exudate Medium Amount: Exudate Purulent Type: Exudate yellow, brown, green Color: Wound Bed Granulation Amount: Large (67-100%) Granulation Quality: Red Fascia Necrotic Amount: None Present (0%) Fat Lay Tendon Muscle Joint E Bone Ex or After Cleansing: No Fibrino Yes Exposed Structure Exposed: No er (Subcutaneous Tissue) Exposed: Yes Exposed: No Exposed: No xposed: No posed: No tion in Area: -68.1% tion in Volume: -68.1% ialization: Medium (34-66%) ng: No ining: No Treatment Notes Wound #1 (Right Hand - Dorsum) 1. Cleanse With Wound Cleanser 3. Primary Dressing Applied Xeroform Gauze 4. Secondary Dressing ABD Pad Dry Gauze Roll Gauze 5. Secured With Recruitment consultant) Signed: 12/09/2018 2:17:56 PM By: Mikeal Hawthorne EMT/HBOT Signed: 12/18/2018 10:53:45 AM By: Baruch Gouty RN,  BSN Previous Signature: 12/03/2018 5:42:21 PM Version By: Baruch Gouty RN, BSN Entered By: Mikeal Hawthorne on 12/09/2018 69:62:95 -------------------------------------------------------------------------------- Wound Assessment Details Patient Name: Date of Service: JABES, PRIMO 12/02/2018 10:45 AM Medical Record [id number] Patient Account Number: [id number] Date of Birth/Sex: Treating RN: 06/26/1945 (73 y.o. [name] Primary Care Severus Brodzinski: Beckie Salts Other Clinician: Referring Minsa Weddington: Treating Kaisyn Reinhold/Extender:Robson, Precious Haws, MIKE Weeks in Treatment: 48 Wound Status Wound Number: 12 Primary Pressure Ulcer Etiology: Wound Location: Left Gluteus Wound Open Wounding Event: Gradually Appeared Status: Date Acquired: 09/19/2018 Comorbid Anemia, Hypertension, Type II Diabetes, Weeks Of Treatment: 8 History: Paraplegia Clustered Wound: No Photos Wound Measurements Length: (cm) 0.8 % Reductio Width: (cm) 1.2 % Reductio Depth: (cm) 0.1 Epithelial Area: (cm) 0.754 Tunneling Volume: (cm) 0.075 Undermini Wound Description Classification: Category/Stage II Wound Margin: Flat and Intact Exudate Amount: Small Exudate Type: Serosanguineous Exudate Color: red, brown Wound Bed Granulation Amount: Large (67-100%) Granulation Quality: Red Necrotic Amount: None Present (0%) Foul Odor After Cleansing: No Slough/Fibrino No Exposed Structure Fascia Exposed: No Fat Layer (Subcutaneous Tissue) Exposed: Yes Tendon Exposed: No Muscle Exposed: No Joint Exposed: No Bone Exposed: No n in Area: 92.5% n in Volume: 92.5% ization: Medium (34-66%) : No ng: No Treatment Notes Wound #12 (Left Gluteus) 1. Cleanse With Wound Cleanser 3. Primary Dressing Applied Xeroform Gauze 4. Secondary Dressing ABD Pad Dry Gauze Roll Gauze 5. Secured With Recruitment consultant) Signed: 12/10/2018 2:52:08 PM By: Mikeal Hawthorne EMT/HBOT Signed: 12/18/2018  10:53:45 AM By: Baruch Gouty RN, BSN Previous Signature: 12/03/2018 5:42:21 PM Version By: Baruch Gouty RN, BSN Entered By: Mikeal Hawthorne on 12/10/2018 07:40:19 -------------------------------------------------------------------------------- Wound Assessment Details Patient Name: Date of Service: AERIC, BURNHAM 12/02/2018 10:45 AM Medical Record [id number] Patient Account Number: [id number] Date of Birth/Sex: Treating RN: 02/05/1945 (73 y.o. [name] Primary Care Sergei Delo: Beckie Salts Other Clinician: Referring Gionni Freese: Treating Matvey Llanas/Extender:Robson, Precious Haws, MIKE Weeks in Treatment: 48 Wound Status Wound Number: 13 Primary Atypical Etiology: Wound Location: Left Malleolus - Lateral Wound Open Wounding Event: Gradually Appeared Status: Date Acquired: 12/02/2018 Comorbid Anemia, Hypertension, Type II Diabetes, Weeks Of Treatment: 0 History: Paraplegia Clustered Wound: No Photos Wound Measurements Length: (cm) 2.4 % Reduct Width: (cm) 2.5 % Reduct Depth: (cm) 0.1 Epitheli Area: (cm) 4.712 Tunneli Volume: (cm) 0.471 Undermi Wound Description Full Thickness Without Exposed Support Foul Odo Classification: Structures Slough/F Wound Flat and Intact Margin: Exudate Small Amount: Exudate Serosanguineous Type: Exudate red, brown Color: Wound Bed Granulation Amount: Large (67-100%) Granulation Quality: Red, Hyper-granulation Fascia E Necrotic Amount: None Present (0%) Fat Laye Tendon E Muscle E Joint Ex Bone Exp r After  Cleansing: No ibrino No Exposed Structure xposed: No r (Subcutaneous Tissue) Exposed: Yes xposed: No xposed: No posed: No osed: No ion in Area: 0% ion in Volume: 0% alization: Large (67-100%) ng: No ning: No Treatment Notes Wound #13 (Left, Lateral Malleolus) 1. Cleanse With Wound Cleanser 3. Primary Dressing Applied Xeroform Gauze 4. Secondary Dressing ABD Pad Dry Gauze Roll Gauze 5. Secured  With Recruitment consultant) Signed: 12/09/2018 2:17:56 PM By: Mikeal Hawthorne EMT/HBOT Signed: 12/18/2018 10:53:45 AM By: Baruch Gouty RN, BSN Previous Signature: 12/03/2018 5:42:21 PM Version By: Baruch Gouty RN, BSN Entered By: Mikeal Hawthorne on 12/09/2018 08:16:49 -------------------------------------------------------------------------------- Wound Assessment Details Patient Name: Date of Service: DOSSIE, SWOR 12/02/2018 10:45 AM Medical Record [id number] Patient Account Number: [id number] Date of Birth/Sex: Treating RN: 26-Nov-1945 (73 y.o. [name] Primary Care Hayzel Ruberg: Beckie Salts Other Clinician: Referring Jonatha Gagen: Treating [place]aan Prue/Extender:Robson, Precious Haws, MIKE Weeks in Treatment: 48 Wound Status Wound Number: 2 Primary Atypical Etiology: Wound Location: Right Hand - 2nd Digit - Lateral Wound Healed - Epithelialized Wounding Event: Gradually Appeared Status: Date Acquired: 11/15/2017 Comorbid Anemia, Hypertension, Type II Diabetes, Weeks Of Treatment: 48 History: Paraplegia Clustered Wound: No Photos Wound Measurements Length: (cm) 0 % Reduc Width: (cm) 0 % Reduc Depth: (cm) 0 Epithel Area: (cm) 0 Tunnel Volume: (cm) 0 Underm Wound Description Classification: Full Thickness Without Exposed Support Foul Od Structures Slough/ Wound Flat and Intact Margin: Exudate None Present Amount: Wound Bed Granulation Amount: None Present (0%) Necrotic Amount: None Present (0%) Fascia Fat Lay Tendon Muscle Joint E Bone Ex or After Cleansing: No Fibrino No Exposed Structure Exposed: No er (Subcutaneous Tissue) Exposed: No Exposed: No Exposed: No xposed: No posed: No tion in Area: 100% tion in Volume: 100% ialization: Large (67-100%) ing: No ining: No Electronic Signature(s) Signed: 12/10/2018 2:52:08 PM By: Mikeal Hawthorne EMT/HBOT Signed: 12/18/2018 10:53:45 AM By: Baruch Gouty RN, BSN Previous Signature:  12/03/2018 5:42:21 PM Version By: Baruch Gouty RN, BSN Entered By: Mikeal Hawthorne on 12/10/2018 07:40:49 -------------------------------------------------------------------------------- Wound Assessment Details Patient Name: Date of Service: DARRALL, [name] [name] 12/02/2018 10:45 AM Medical Record [id number] Patient Account Number: [id number] Date of Birth/Sex: Treating RN: 1945/12/03 (73 y.o. [name] Primary Care Ilani Otterson: Beckie Salts Other Clinician: Referring Taven Strite: Treating Medora Roorda/Extender:Robson, Precious Haws, MIKE Weeks in Treatment: 48 Wound Status Wound Number: 5 Primary Diabetic Wound/Ulcer of the Lower Etiology: Extremity Wound Location: Right Lower Leg - Anterior Wound Open Wounding Event: Gradually Appeared Status: Date Acquired: 11/15/2017 Comorbid Anemia, Hypertension, Type II Diabetes, Weeks Of Treatment: 48 History: Paraplegia Clustered Wound: Yes Photos Wound Measurements Length: (cm) 8.5 % Reducti Width: (cm) 3 % Reducti Depth: (cm) 0.1 Epithelia Clustered Quantity: 9 Tunneling Area: (cm) 20.028 Undermin Volume: (cm) 2.003 Wound Description Classification: Grade 2 Foul Odo Wound Margin: Flat and Intact Slough/F Exudate Amount: Small Exudate Type: Serosanguineous Exudate Color: red, brown Wound Bed Granulation Amount: Medium (34-66%) Granulation Quality: Red, Hyper-granulation, Friable Fascia Ex Necrotic Amount: None Present (0%) Fat Layer Tendon Ex Muscle Joint Bone E r After Cleansing: No ibrino No Exposed Structure posed: No (Subcutaneous Tissue) Exposed: Yes posed: No Exposed: No Exposed: No xposed: No on in Area: 82.6% on in Volume: 82.6% lization: Large (67-100%) : No ing: No Treatment Notes Wound #5 (Right, Anterior Lower Leg) 1. Cleanse With Wound Cleanser 3. Primary Dressing Applied Xeroform Gauze 4. Secondary Dressing ABD Pad Dry Gauze Roll Gauze 5. Secured With Architect) Signed: 12/09/2018 2:17:56 PM By: Mikeal Hawthorne EMT/HBOT Signed: 12/18/2018 10:53:45 AM By: Johna Roles,  Jacques Navy, BSN Previous Signature: 12/03/2018 5:42:21 PM Version By: Baruch Gouty RN, BSN Entered By: Mikeal Hawthorne on 12/09/2018 08:17:15 -------------------------------------------------------------------------------- Wound Assessment Details Patient Name: Date of Service: [name] [name], [name] [name] 12/02/2018 10:45 AM Medical Record [id number] Patient Account Number: [id number] Date of Birth/Sex: Treating RN: 12/22/1945 (73 y.o. [name] Primary Care Chaynce Schafer: Beckie Salts Other Clinician: Referring Deontay Ladnier: Treating Krissi Willaims/Extender:Robson, Precious Haws, MIKE Weeks in Treatment: 48 Wound Status Wound Number: 6 Primary Diabetic Wound/Ulcer of the Lower Etiology: Extremity Wound Location: Right Lower Leg - Lateral, Posterior Wound Open Wounding Event: Gradually Appeared Status: Date Acquired: 11/15/2017 Comorbid Anemia, Hypertension, Type II Diabetes, Weeks Of Treatment: 48 History: Paraplegia Clustered Wound: Yes Wound Measurements Length: (cm) 10 % Redu Width: (cm) 4.5 % Redu Depth: (cm) 0.1 Epithe Clustered Quantity: 5 Tunnel Area: (cm) 35.343 Under Volume: (cm) 3.534 Wound Description Classification: Grade 2 Foul O Wound Margin: Flat and Intact Slough Exudate Amount: Small Exudate Type: Serosanguineous Exudate Color: red, brown Wound Bed Granulation Amount: Large (67-100%) Granulation Quality: Red, Hyper-granulation, Friable Fascia Ex Necrotic Amount: None Present (0%) Fat Layer Tendon Ex Muscle Ex Joint Exp Bone Expo dor After Cleansing: No /Fibrino No Exposed Structure posed: No (Subcutaneous Tissue) Exposed: Yes posed: No posed: No osed: No sed: No ction in Area: -30.7% ction in Volume: -30.6% lialization: Medium (34-66%) ing: No mining: No Treatment Notes Wound #6 (Right, Lateral, Posterior Lower Leg) 1. Cleanse  With Wound Cleanser 3. Primary Dressing Applied Xeroform Gauze 4. Secondary Dressing ABD Pad Dry Gauze Roll Gauze 5. Secured With Recruitment consultant) Signed: 12/03/2018 5:42:21 PM By: Baruch Gouty RN, BSN Entered By: Baruch Gouty on 12/02/2018 12:42:33 -------------------------------------------------------------------------------- Wound Assessment Details Patient Name: Date of Service: [name] [name], [name] [name] 12/02/2018 10:45 AM Medical Record [id number] Patient Account Number: [id number] Date of Birth/Sex: Treating RN: January 06, 1946 (73 y.o. [name] Primary Care Niala Stcharles: Beckie Salts Other Clinician: Referring Kerwin Augustus: Treating Kaedan Richert/Extender:Robson, Precious Haws, MIKE Weeks in Treatment: 48 Wound Status Wound Number: 8 Primary Diabetic Wound/Ulcer of the Lower Etiology: Extremity Wound Location: Left Lower Leg - Anterior Wound Open Wounding Event: Gradually Appeared Status: Date Acquired: 11/15/2017 Comorbid Anemia, Hypertension, Type II Diabetes, Weeks Of Treatment: 48 History: Paraplegia Clustered Wound: Yes Photos Wound Measurements Length: (cm) 21.5 % Reductio Width: (cm) 0.6 % Reductio Depth: (cm) 0.1 Epithelial Clustered Quantity: 10 Tunneling: Area: (cm) 10.132 Undermini Volume: (cm) 1.013 Wound Description Classification: Grade 2 Wound Margin: Flat and Intact Exudate Amount: Small Exudate Type: Serosanguineous Exudate Color: red, brown Wound Bed Granulation Amount: Large (67-100%) Granulation Quality: Red, Hyper-granulation, Friable Necrotic Amount: None Present (0%) Foul Odor After Cleansing: No Slough/Fibrino No Exposed Structure Fascia Exposed: No Fat Layer (Subcutaneous Tissue) Exposed: Yes Tendon Exposed: No Muscle Exposed: No Joint Exposed: No Bone Exposed: No n in Area: 50.3% n in Volume: 50.3% ization: Large (67-100%) No ng: No Treatment Notes Wound #8 (Left, Anterior Lower Leg) 1. Cleanse  With Wound Cleanser 3. Primary Dressing Applied Xeroform Gauze 4. Secondary Dressing ABD Pad Dry Gauze Roll Gauze 5. Secured With Recruitment consultant) Signed: 12/09/2018 2:17:56 PM By: Mikeal Hawthorne EMT/HBOT Signed: 12/18/2018 10:53:45 AM By: Baruch Gouty RN, BSN Previous Signature: 12/03/2018 5:42:21 PM Version By: Baruch Gouty RN, BSN Entered By: Mikeal Hawthorne on 12/09/2018 08:30:10 -------------------------------------------------------------------------------- Vitals Details Patient Name: Date of Service: [name] [name], [name] [name] 12/02/2018 10:45 AM Medical Record [id number] Patient Account Number: [id number] Date of Birth/Sex: Treating RN: 02/08/1945 (73 y.o. [name] Primary Care Mai Longnecker: Beckie Salts Other Clinician: Referring Lilliahna Schubring: Treating Illa Enlow/Extender:Robson, Legrand Como  GASSEMI, MIKE Weeks in Treatment: 48 Vital Signs Time Taken: 12:08 Temperature (F): 98.4 Height (in): 67 Pulse (bpm): 84 Source: Stated Respiratory Rate (breaths/min): 18 Weight (lbs): 130 Blood Pressure (mmHg): 140/82 Source: Stated Reference Range: 80 - 120 mg / dl Body Mass Index (BMI): 20.4 Electronic Signature(s) Signed: 12/03/2018 5:42:21 PM By: Baruch Gouty RN, BSN Entered By: Baruch Gouty on 12/02/2018 12:26:13

## 2019-01-26 ENCOUNTER — Ambulatory Visit (HOSPITAL_BASED_OUTPATIENT_CLINIC_OR_DEPARTMENT_OTHER): Payer: Medicare Other | Admitting: Internal Medicine

## 2019-02-12 ENCOUNTER — Encounter (HOSPITAL_BASED_OUTPATIENT_CLINIC_OR_DEPARTMENT_OTHER): Payer: Medicare Other | Attending: Internal Medicine | Admitting: Internal Medicine

## 2019-02-12 ENCOUNTER — Other Ambulatory Visit: Payer: Self-pay

## 2019-02-12 DIAGNOSIS — S61401D Unspecified open wound of right hand, subsequent encounter: Secondary | ICD-10-CM | POA: Insufficient documentation

## 2019-02-12 DIAGNOSIS — G8250 Quadriplegia, unspecified: Secondary | ICD-10-CM | POA: Insufficient documentation

## 2019-02-12 DIAGNOSIS — L89322 Pressure ulcer of left buttock, stage 2: Secondary | ICD-10-CM | POA: Insufficient documentation

## 2019-02-12 DIAGNOSIS — L97811 Non-pressure chronic ulcer of other part of right lower leg limited to breakdown of skin: Secondary | ICD-10-CM | POA: Diagnosis not present

## 2019-02-12 DIAGNOSIS — X58XXXD Exposure to other specified factors, subsequent encounter: Secondary | ICD-10-CM | POA: Diagnosis not present

## 2019-02-12 DIAGNOSIS — L97213 Non-pressure chronic ulcer of right calf with necrosis of muscle: Secondary | ICD-10-CM | POA: Diagnosis present

## 2019-02-12 NOTE — Progress Notes (Signed)
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, stage 2 Facility Procedures CPT4 Code: [id number] Description: [id number] - WOUND CARE VISIT-LEV 5 EST PT Modifier: Quantity: 1 Physician Procedures CPT4 Code Description: [id number] - WC PHYS LEVEL 3 - EST PT ICD-10 Diagnosis Description 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

## 2019-02-13 NOTE — Progress Notes (Addendum)
[name] [name], [name] [name] ([id number]) Visit Report for 02/12/2019 Arrival Information Details Patient Name: Date of Service: [name] [name], [name] [name] 02/12/2019 10:45 AM Medical Record [id number] Patient Account Number: [id number] Date of Birth/Sex: Treating RN: 1945-07-14 (74 y.o. [name] [name] Primary Care : Beckie Salts Other Clinician: Referring : Treating /Extender:Robson, Precious Haws, MIKE Weeks in Treatment: 45 Visit Information History Since Last Visit Added or deleted any medications: No Patient Arrived: Stretcher Any new allergies or adverse reactions: No Arrival Time: 10:59 Had a fall or experienced change in No Accompanied By: transport activities of daily living that may affect Transfer Assistance: Stretcher risk of falls: Patient Identification Verified: Yes Signs or symptoms of abuse/neglect since last No Secondary Verification Process Yes visito Completed: Hospitalized since last visit: No Patient Requires Transmission- No Implantable device outside of the clinic excluding No Based Precautions: cellular tissue based products placed in the center Patient Has Alerts: Yes since last visit: Patient Alerts: R ABI non Has Dressing in Place as Prescribed: Yes compressible Pain Present Now: Yes Electronic Signature(s) Signed: 02/12/2019 5:20:11 PM By: Kela Millin Entered By: Kela Millin on 02/12/2019 11:00:16 -------------------------------------------------------------------------------- Clinic Level of Care Assessment Details Patient Name: Date of Service: [name] [name] 02/12/2019 10:45 AM Medical Record [id number] Patient Account Number: [id number] Date of Birth/Sex: Treating RN: November 09, 1945 (74 y.o. [name] [name] Primary Care : Beckie Salts Other Clinician: Referring : Treating /Extender:Robson, Precious Haws, MIKE Weeks in Treatment: 66 Clinic Level of Care Assessment Items TOOL 4  Quantity Score X - Use when only an EandM is performed on FOLLOW-UP visit 1 0 ASSESSMENTS - Nursing Assessment / Reassessment X - Reassessment of Co-morbidities (includes updates in patient status) 1 10 X - Reassessment of Adherence to Treatment Plan 1 5 ASSESSMENTS - Wound and Skin Assessment / Reassessment [] - Simple Wound Assessment / Reassessment - one wound 0 X - Complex Wound Assessment / Reassessment - multiple wounds 4 5 X - Dermatologic / Skin Assessment (not related to wound area) 1 10 ASSESSMENTS - Focused Assessment X - Circumferential Edema Measurements - multi extremities 2 5 X - Nutritional Assessment / Counseling / Intervention 1 10 [] - Lower Extremity Assessment (monofilament, tuning fork, pulses) 0 [] - Peripheral Arterial Disease Assessment (using hand held doppler) 0 ASSESSMENTS - Ostomy and/or Continence Assessment and Care [] - Incontinence Assessment and Management 0 [] - Ostomy Care Assessment and Management (repouching, etc.) 0 PROCESS - Coordination of Care [] - Simple Patient / Family Education for ongoing care 0 X - Complex (extensive) Patient / Family Education for ongoing care 1 20 X - Staff obtains Programmer, systems, Records, Test Results / Process Orders 1 10 [] - Staff telephones HHA, Nursing Homes / Clarify orders / etc 0 [] - Routine Transfer to another Facility (non-emergent condition) 0 [] - Routine Hospital Admission (non-emergent condition) 0 [] - New Admissions / Biomedical engineer / Ordering NPWT, Apligraf, etc. 0 [] - Emergency Hospital Admission (emergent condition) 0 [] - Simple Discharge Coordination 0 X - Complex (extensive) Discharge Coordination 1 15 PROCESS - Special Needs [] - Pediatric / Minor Patient Management 0 [] - Isolation Patient Management 0 [] - Hearing / Language / Visual special needs 0 [] - Assessment of Community assistance (transportation, D/C planning, etc.) 0 [] - Additional assistance / Altered mentation 0 [] -  Support Surface(s) Assessment (bed, cushion, seat, etc.) 0 INTERVENTIONS - Wound Cleansing / Measurement [] - Simple Wound Cleansing - one wound 0 X - Complex Wound Cleansing - multiple wounds 4  5 X - Wound Imaging (photographs - any number of wounds) 1 5 [] - Wound Tracing (instead of photographs) 0 [] - Simple Wound Measurement - one wound 0 X - Complex Wound Measurement - multiple wounds 4 5 INTERVENTIONS - Wound Dressings [] - Small Wound Dressing one or multiple wounds 0 [] - Medium Wound Dressing one or multiple wounds 0 X - Large Wound Dressing one or multiple wounds 3 20 [] - Application of Medications - topical 0 [] - Application of Medications - injection 0 INTERVENTIONS - Miscellaneous [] - External ear exam 0 [] - Specimen Collection (cultures, biopsies, blood, body fluids, etc.) 0 [] - Specimen(s) / Culture(s) sent or taken to Lab for analysis 0 [] - Patient Transfer (multiple staff / Civil Service fast streamer / Similar devices) 0 [] - Simple Staple / Suture removal (25 or less) 0 [] - Complex Staple / Suture removal (26 or more) 0 [] - Hypo / Hyperglycemic Management (close monitor of Blood Glucose) 0 [] - Ankle / Brachial Index (ABI) - do not check if billed separately 0 X - Vital Signs 1 5 Has the patient been seen at the hospital within the last three years: Yes Total Score: 220 Level Of Care: New/Established - Level 5 Electronic Signature(s) Signed: 02/12/2019 6:05:55 PM By: Deon Pilling Entered By: Deon Pilling on 02/12/2019 11:37:47 -------------------------------------------------------------------------------- Encounter Discharge Information Details Patient Name: Date of Service: [name] [name], [name] [name] 02/12/2019 10:45 AM Medical Record [id number] Patient Account Number: [id number] Date of Birth/Sex: Treating RN: 1945/02/25 (74 y.o. [name] [name] Primary Care : Beckie Salts Other Clinician: Referring : Treating /Extender:Robson,  Precious Haws, MIKE Weeks in Treatment: 12 Encounter Discharge Information Items Discharge Condition: Stable Ambulatory Status: Stretcher Discharge Destination: Home Transportation: Ambulance Accompanied By: EMS Schedule Follow-up Appointment: Yes Clinical Summary of Care: Patient Declined Electronic Signature(s) Signed: 02/13/2019 8:33:08 AM By: Baruch Gouty RN, BSN Entered By: Baruch Gouty on 02/12/2019 12:13:18 -------------------------------------------------------------------------------- Lower Extremity Assessment Details Patient Name: Date of Service: [name] [name], [name] [name] 02/12/2019 10:45 AM Medical Record [id number] Patient Account Number: [id number] Date of Birth/Sex: Treating RN: [birth year] (74 y.o. [name] [name] Primary Care : Beckie Salts Other Clinician: Referring : Treating /Extender:Robson, Precious Haws, MIKE Weeks in Treatment: 46 Edema Assessment Assessed: [Left: No] [Right: No] Edema: [Left: No] [Right: No] Calf Left: Right: Point of Measurement: cm From Medial Instep 32.8 cm 30 cm Ankle Left: Right: Point of Measurement: cm From Medial Instep 17 cm 16.9 cm Vascular Assessment Pulses: Dorsalis Pedis Palpable: [Left:Yes] [Right:Yes] Electronic Signature(s) Signed: 02/12/2019 5:20:11 PM By: Kela Millin Entered By: Kela Millin on 02/12/2019 11:11:11 -------------------------------------------------------------------------------- Multi Wound Chart Details Patient Name: Date of Service: [name] [name], [name] [name] 02/12/2019 10:45 AM Medical Record [id number] Patient Account Number: [id number] Date of Birth/Sex: Treating RN: 09-03-1945 (74 y.o. 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] [13:Atypical] Comorbid History: [1:Anemia, Hypertension, TypeAnemia, Hypertension, TypeAnemia, Hypertension, Type II Diabetes, Paraplegia] [12:II Diabetes, Paraplegia II Diabetes, Paraplegia] Date Acquired: [1:11/15/2017] [12:09/19/2018] [13:12/02/2018] Weeks of Treatment: [1:58] [12:19] [13:10] Wound Status: [1:Open] [12:Healed - Epithelialized Healed - Epithelialized] Clustered Wound: [1:Yes] [12:No] [13:No] Clustered Quantity: [1:10] [12:N/A] [13:N/A] Measurements L x W  x D 15x13.5x0.1 [12:0x0x0] [13:0x0x0] (cm) Area (cm) : [1:159.043] [12:0] [13:0] Volume (cm) : [1:15.904] [12:0] [13:0] % Reduction in Area: [1:-398.80%] [12:100.00%] [13:100.00%] % Reduction in Volume: -398.70% [12:100.00%] [13:100.00%] Classification: [1:Full Thickness Without Exposed Support Structures] [12:Category/Stage II] [13:Full Thickness Without Exposed Support Structures] Exudate Amount: [1:Medium] [12:None Present] [13:None Present] Exudate Type: [1:Serous] [12:N/A] [13:N/A] Exudate Color: [1:amber] [12:N/A] [13:N/A] Wound Margin: [1:Flat and Intact] [12:Flat and Intact] [13:Flat and Intact] Granulation Amount: [1:Large (67-100%)] [12:None Present (0%)] [13:None Present (0%)] Granulation Quality: [1:Red] [12:N/A] [13:N/A] Necrotic Amount: [1:None Present (0%)] [12:None Present (0%)] [13:None Present (0%)] Exposed Structures: [1:Fat Layer (Subcutaneous Fascia: No Tissue) Exposed: Yes Fascia: No Tendon: No Muscle: No Joint: No Bone: No] [12:Fat Layer (Subcutaneous Fat Layer (Subcutaneous Tissue) Exposed: No Tendon: No Muscle: No Joint: No Bone: No] [13:Fascia: No  Tissue)  Exposed: No Tendon: No Muscle: No Joint: No Bone: No] Epithelialization: [1:Medium (34-66%)] [12:Large (67-100%) 5] [13:Large (67-100%) 6] Photos: [1:No Photos] [12:No Photos] [13:No Photos] Wound Location: [1:Right Lower Leg - Anterior Right Lower Leg - Lateral, Left Lower Leg - Anterior] [12:Posterior] Wounding Event: [1:Gradually Appeared] [12:Gradually Appeared] [13:Gradually Appeared] 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] [12:Lower Extremity] [13:Lower Extremity] Comorbid History: [1:Anemia, Hypertension, TypeAnemia, Hypertension, TypeAnemia, Hypertension, Type II Diabetes, Paraplegia] [12:II Diabetes, Paraplegia] [13:II Diabetes, Paraplegia] Date Acquired: [1:11/15/2017] [12:11/15/2017] [13:11/15/2017] Weeks of Treatment: [1:58] [12:58] [13:58] Wound Status: [1:Open] [12:Open] [13:Open] Clustered Wound: [1:Yes] [12:Yes] [13:Yes] Clustered Quantity: [1:9] [12:5] [13:10] Measurements L x W x D 3.5x2.5x0.1 [12:0.5x0.3x0.1] [13:13x5.5x0.1] (cm) Area (cm) : [1:6.872] [12:0.118] [13:56.156] Volume (cm) : [1:0.687] [12:0.012] [13:5.616] % Reduction in Area: [1:94.00%] [12:99.60%] [13:-175.40%] % Reduction in Volume: 94.00% [12:99.60%] [13:-175.40%] Classification: [1:Grade 2] [12:Grade 2] [13:Grade 2] Exudate Amount: [1:Small] [12:Small] [13:Small] Exudate Type: [1:Serosanguineous] [12:Serosanguineous] [13:Serosanguineous] Exudate Color: [1:red, brown] [12:red, brown] [13:red, brown] Wound Margin: [1:Flat and Intact] [12:Flat and Intact] [13:Flat and Intact] Granulation Amount: [1:Medium (34-66%)] [12:Large (67-100%)] [13:Large (67-100%)] Granulation Quality: [1:Red, Hyper-granulation, Friable] [12:Red, Hyper-granulation, Friable] [13:Red, Hyper-granulation, Friable] Necrotic Amount: [1:None Present (0%)] [12:None Present (0%)] [13:None Present (0%)] Exposed Structures: [1:Fat Layer (Subcutaneous  Fat Layer (Subcutaneous Fat Layer (Subcutaneous Tissue) Exposed: Yes Fascia: No Tendon: No Muscle: No Joint: No Bone: No Large (67-100%)] [12:Tissue) Exposed: Yes Fascia: No Tendon: No Muscle: No Joint: No Bone: No Medium  (34-66%)] [13:Tissue) Exposed: Yes Fascia: No Tendon: No Muscle: No Joint: No Bone: No Large (67-100%)] Treatment Notes Wound #1 (Right Hand - Dorsum) 2. Periwound Care Moisturizing lotion 3. Primary Dressing Applied Other primary dressing (specifiy in notes) 4. Secondary Dressing Dry Gauze Roll Gauze Notes vaseline gauze Wound #5 (Right, Anterior Lower Leg) 2. Periwound Care Moisturizing lotion 3. Primary Dressing Applied Other primary dressing (specifiy in notes) 4. Secondary Dressing Dry Gauze Roll Gauze Notes vaseline gauze Wound #6 (Right, Lateral, Posterior Lower Leg) 2. Periwound Care Moisturizing lotion 3. Primary Dressing Applied Other primary dressing (specifiy in notes) 4. Secondary Dressing Dry Gauze Roll Gauze Notes vaseline gauze Wound #8 (Left, Anterior Lower Leg) 2. Periwound Care Moisturizing lotion 3. Primary Dressing Applied Other primary dressing (specifiy in notes) 4. Secondary Dressing Dry Gauze Roll Gauze Notes vaseline gauze Electronic Signature(s) Signed: 02/12/2019 5:54:40 PM By: Linton Ham MD Entered By: Linton Ham on 02/12/2019 13:08:35 -------------------------------------------------------------------------------- Multi-Disciplinary Care Plan Details Patient Name: Date of Service: [name] [name], [name] [name] 02/12/2019 10:45 AM Medical Record [id number] Patient Account Number: [id number] Date of Birth/Sex: Treating RN: August 20, 1945 (74 y.o. [name] [name] Primary Care : Oswaldo Milian,  MIKE Other Clinician: Referring Dozier Berkovich: Treating Blanchie Zeleznik/Extender:Robson, Precious Haws, MIKE Weeks in Treatment: 25 Active Inactive Abuse / Safety / Falls / Self Care Management Nursing Diagnoses: Impaired physical  mobility Potential for injury related to transfers Goals: Patient will not develop complications from immobility Date Initiated: 12/31/2017 Date Inactivated: 02/10/2018 Target Resolution Date: 01/31/2018 Goal Status: Met Patient/caregiver will verbalize understanding of skin care regimen Date Initiated: 12/31/2017 Target Resolution Date: 03/20/2019 Goal Status: Active Patient/caregiver will verbalize/demonstrate measures taken to prevent injury and/or falls Date Initiated: 12/31/2017 Date Inactivated: 02/10/2018 Target Resolution Date: 01/31/2018 Goal Status: Met Interventions: Assess Activities of Daily Living upon admission and as needed Assess fall risk on admission and as needed Assess: immobility, friction, shearing, incontinence upon admission and as needed Assess impairment of mobility on admission and as needed per policy Assess personal safety and home safety (as indicated) on admission and as needed Assess self care needs on admission and as needed Notes: Electronic Signature(s) Signed: 02/12/2019 6:05:55 PM By: Deon Pilling Entered By: Deon Pilling on 02/12/2019 11:03:22 -------------------------------------------------------------------------------- Pain Assessment Details Patient Name: Date of Service: [name] [name], [name] [name] 02/12/2019 10:45 AM Medical Record [id number] Patient Account Number: [id number] Date of Birth/Sex: Treating RN: [birth year] (74 y.o. [name] [name] Primary Care Caelin Rayl: Beckie Salts Other Clinician: Referring Joie Reamer: Treating Laverne Hursey/Extender:Robson, Precious Haws, MIKE Weeks in Treatment: 41 Active Problems Location of Pain Severity and Description of Pain Patient Has Paino Yes Site Locations Pain Location: Generalized Pain With Dressing Change: Yes Duration of the Pain. Constant / Intermittento Constant Rate the pain. Current Pain Level: 4 Worst Pain Level: 8 Least Pain Level: 4 Tolerable Pain Level: 4 Character of  Pain Describe the Pain: Aching, Burning, Sharp Pain Management and Medication Current Pain Management: Electronic Signature(s) Signed: 02/12/2019 5:20:11 PM By: Kela Millin Entered By: Kela Millin on 02/12/2019 11:01:06 -------------------------------------------------------------------------------- Patient/Caregiver Education Details Patient Name: Date of Service: [name] [name] 1/28/2021andnbsp10:45 AM Medical Record [id number] Patient Account Number: [id number] Date of Birth/Gender: Sep 14, 1945 (74 y.o. M) Treating RN: Deon Pilling Primary Care Physician: Beckie Salts Other Clinician: Referring Physician: Treating Physician/Extender:Robson, Precious Haws, MIKE Weeks in Treatment: 33 Education Assessment Education Provided To: Patient Education Topics Provided Wound/Skin Impairment: Handouts: Skin Care Do's and Dont's Methods: Explain/Verbal Responses: Reinforcements needed Electronic Signature(s) Signed: 02/12/2019 6:05:55 PM By: Deon Pilling Entered By: Deon Pilling on 02/12/2019 11:03:32 -------------------------------------------------------------------------------- Wound Assessment Details Patient Name: Date of Service: [name] [name], [name] [name] 02/12/2019 10:45 AM Medical Record [id number] Patient Account Number: [id number] Date of Birth/Sex: Treating RN: September 30, 1945 (74 y.o. M) Primary Care Eliska Hamil: Beckie Salts Other Clinician: Referring Demarrio Menges: Treating Valor [place]/Extender:Robson, Precious Haws, MIKE Weeks in Treatment: 45 Wound Status Wound Number: 1 Primary Atypical Etiology: Wound Location: Right Hand - Dorsum Wound Open Wounding Event: Gradually Appeared Status: Date Acquired: 11/15/2017 Comorbid Anemia, Hypertension, Type II Diabetes, Weeks Of Treatment: 58 History: Paraplegia Clustered Wound: Yes Photos Wound Measurements Length: (cm) 15 % Reduct Width: (cm) 13.5 % Reduct Depth: (cm) 0.1 Epitheli Clustered Quantity: 10  Tunnelin Area: (cm) 159.043 Undermi Volume: (cm) 15.904 Wound Description Full Thickness Without Exposed Support Foul Od Classification: Structures Slough/ Wound Flat and Intact Margin: Exudate Medium Amount: Exudate Serous Type: Exudate amber Color: Wound Bed Granulation Amount: Large (67-100%) Granulation Quality: Red Fascia Necrotic Amount: None Present (0%) Fat Lay Tendon Muscle Joint Bone E or After Cleansing: No Fibrino No Exposed Structure Exposed: No er (Subcutaneous Tissue) Exposed: Yes Exposed: No Exposed: No Exposed: No xposed: No ion in Area: -398.8% ion in Volume: -398.7% alization: Medium (34-66%) g: No ning:  No Treatment Notes Wound #1 (Right Hand - Dorsum) 2. Periwound Care Moisturizing lotion 3. Primary Dressing Applied Other primary dressing (specifiy in notes) 4. Secondary Dressing Dry Gauze Roll Gauze Notes vaseline gauze Electronic Signature(s) Signed: 02/12/2019 5:34:21 PM By: Mikeal Hawthorne EMT/HBOT Entered By: Mikeal Hawthorne on 02/12/2019 16:49:22 -------------------------------------------------------------------------------- Wound Assessment Details Patient Name: Date of Service: [name] [name], [name] [name] 02/12/2019 10:45 AM Medical Record [id number] Patient Account Number: [id number] Date of Birth/Sex: Treating RN: March 12, 1945 (74 y.o. M) Primary Care : Beckie Salts Other Clinician: Referring : Treating /Extender:Robson, Precious Haws, MIKE Weeks in Treatment: 72 Wound Status Wound Number: 12 Primary Pressure Ulcer Etiology: Wound Location: Left Gluteus Wound Healed - Epithelialized Wounding Event: Gradually Appeared Status: Date Acquired: 09/19/2018 Comorbid Anemia, Hypertension, Type II Diabetes, Weeks Of Treatment: 19 History: Paraplegia Clustered Wound: No Photos Wound Measurements Length: (cm) 0 % Reduc Width: (cm) 0 % Reduc Depth: (cm) 0 Epithel Area: (cm) 0 Tunnelin Volume: (cm) 0  Undermin Wound Description Classification: Category/Stage II Foul Odo Wound Margin: Flat and Intact Slough/F Exudate Amount: None Present Wound Bed Granulation Amount: None Present (0%) Necrotic Amount: None Present (0%) Fascia Ex Fat Layer Tendon Ex Muscle Ex Joint Exp Bone Expo r After Cleansing: No ibrino No Exposed Structure posed: No (Subcutaneous Tissue) Exposed: No posed: No posed: No osed: No sed: No tion in Area: 100% tion in Volume: 100% ialization: Large (67-100%) g: No ing: No Electronic Signature(s) Signed: 02/13/2019 4:26:31 PM By: Mikeal Hawthorne EMT/HBOT Previous Signature: 02/12/2019 5:20:11 PM Version By: Kela Millin Entered By: Mikeal Hawthorne on 02/13/2019 10:21:01 -------------------------------------------------------------------------------- Wound Assessment Details Patient Name: Date of Service: [name] [name], [name] [name] 02/12/2019 10:45 AM Medical Record [id number] Patient Account Number: [id number] Date of Birth/Sex: Treating RN: 1945-06-27 (74 y.o. M) Primary Care : Beckie Salts Other Clinician: Referring : Treating /Extender:Robson, Precious Haws, MIKE Weeks in Treatment: 90 Wound Status Wound Number: 13 Primary Atypical Etiology: Wound Location: Left Malleolus - Lateral Wound Healed - Epithelialized Wounding Event: Gradually Appeared Status: Date Acquired: 12/02/2018 Comorbid Anemia, Hypertension, Type II Diabetes, Weeks Of Treatment: 10 History: Paraplegia Clustered Wound: No Photos Wound Measurements Length: (cm) 0 % Reducti Width: (cm) 0 % Reduct Depth: (cm) 0 Epitheli Area: (cm) 0 Tunneli Volume: (cm) 0 Undermi Wound Description Classification: Full Thickness Without Exposed Support Structures Wound Flat and Intact Margin: Exudate None Present Amount: Wound Bed Granulation Amount: None Present (0%) Necrotic Amount: None Present (0%) Foul Odor After Cleansing: No Slough/Fibrino  No Exposed Structure Fascia Exposed: No Fat Layer (Subcutaneous Tissue) Exposed: No Tendon Exposed: No Muscle Exposed: No Joint Exposed: No Bone Exposed: No on in Area: 100% ion in Volume: 100% alization: Large (67-100%) ng: No ning: No Electronic Signature(s) Signed: 02/13/2019 4:26:31 PM By: Mikeal Hawthorne EMT/HBOT Previous Signature: 02/12/2019 5:20:11 PM Version By: Kela Millin Entered By: Mikeal Hawthorne on 02/13/2019 10:15:34 -------------------------------------------------------------------------------- Wound Assessment Details Patient Name: Date of Service: [name] [name], [name] [name] 02/12/2019 10:45 AM Medical Record [id number] Patient Account Number: [id number] Date of Birth/Sex: Treating RN: October 15, 1945 (74 y.o. M) Primary Care : Beckie Salts Other Clinician: Referring : Treating /Extender:Robson, Precious Haws, MIKE Weeks in Treatment: 69 Wound Status Wound Number: 5 Primary Diabetic Wound/Ulcer of the Lower Etiology: Extremity Wound Location: Right Lower Leg - Anterior Wound Open Wounding Event: Gradually Appeared Status: Date Acquired: 11/15/2017 Comorbid Anemia, Hypertension, Type II Diabetes, Weeks Of Treatment: 58 History: Paraplegia Clustered Wound: Yes Photos Wound Measurements Length: (cm) 3.5 % Reducti Width: (cm) 2.5 % Reducti Depth: (cm) 0.1 Epithelia Clustered Quantity: 9 Tunneling Area: (  cm) 6.872 Undermin Volume: (cm) 0.687 Wound Description Classification: Grade 2 Wound Margin: Flat and Intact Exudate Amount: Small Exudate Type: Serosanguineous Exudate Color: red, brown Wound Bed Granulation Amount: Medium (34-66%) Granulation Quality: Red, Hyper-granulation, Friable Necrotic Amount: None Present (0%) Foul Odor After Cleansing: No Slough/Fibrino No Exposed Structure Fascia Exposed: No Fat Layer (Subcutaneous Tissue) Exposed: Yes Tendon Exposed: No Muscle Exposed: No Joint Exposed: No Bone Exposed:  No on in Area: 94% on in Volume: 94% lization: Large (67-100%) : No ing: No Treatment Notes Wound #5 (Right, Anterior Lower Leg) 2. Periwound Care Moisturizing lotion 3. Primary Dressing Applied Other primary dressing (specifiy in notes) 4. Secondary Dressing Dry Gauze Roll Gauze Notes vaseline gauze Electronic Signature(s) Signed: 02/13/2019 4:26:31 PM By: Mikeal Hawthorne EMT/HBOT Previous Signature: 02/12/2019 5:20:11 PM Version By: Kela Millin Entered By: Mikeal Hawthorne on 02/13/2019 10:15:57 -------------------------------------------------------------------------------- Wound Assessment Details Patient Name: Date of Service: JAYRO, MCMATH 02/12/2019 10:45 AM Medical Record [id number] Patient Account Number: [id number] Date of Birth/Sex: Treating RN: September 26, 1945 (74 y.o. M) Primary Care : Beckie Salts Other Clinician: Referring : Treating /Extender:Robson, Precious Haws, MIKE Weeks in Treatment: 89 Wound Status Wound Number: 6 Primary Diabetic Wound/Ulcer of the Lower Etiology: Extremity Wound Location: Right Lower Leg - Lateral, Posterior Wound Open Wounding Event: Gradually Appeared Status: Date Acquired: 11/15/2017 Comorbid Anemia, Hypertension, Type II Diabetes, Weeks Of Treatment: 58 History: Paraplegia Clustered Wound: Yes Photos Wound Measurements Length: (cm) 0.5 % Redu Width: (cm) 0.3 % Redu Depth: (cm) 0.1 Epithe Clustered Quantity: 5 Tunnel Area: (cm) 0.118 Under Volume: (cm) 0.012 Wound Description Classification: Grade 2 Wound Margin: Flat and Intact Exudate Amount: Small Exudate Type: Serosanguineous Exudate Color: red, brown Wound Bed Granulation Amount: Large (67-100%) Granulation Quality: Red, Hyper-granulation, Friable Necrotic Amount: None Present (0%) Foul Odor After Cleansing: No Slough/Fibrino No Exposed Structure Fascia Exposed: No Fat Layer (Subcutaneous Tissue) Exposed: Yes Tendon  Exposed: No Muscle Exposed: No Joint Exposed: No Bone Exposed: No ction in Area: 99.6% ction in Volume: 99.6% lialization: Medium (34-66%) ing: No mining: No Treatment Notes Wound #6 (Right, Lateral, Posterior Lower Leg) 2. Periwound Care Moisturizing lotion 3. Primary Dressing Applied Other primary dressing (specifiy in notes) 4. Secondary Dressing Dry Gauze Roll Gauze Notes vaseline gauze Electronic Signature(s) Signed: 02/13/2019 4:26:31 PM By: Mikeal Hawthorne EMT/HBOT Previous Signature: 02/12/2019 5:20:11 PM Version By: Kela Millin Entered By: Mikeal Hawthorne on 02/13/2019 10:16:21 -------------------------------------------------------------------------------- Wound Assessment Details Patient Name: Date of Service: AMAD, MAU 02/12/2019 10:45 AM Medical Record [id number] Patient Account Number: [id number] Date of Birth/Sex: Treating RN: Jul 04, 1945 (74 y.o. M) Primary Care : Beckie Salts Other Clinician: Referring : Treating /Extender:Robson, Precious Haws, MIKE Weeks in Treatment: 67 Wound Status Wound Number: 8 Primary Diabetic Wound/Ulcer of the Lower Etiology: Extremity Wound Location: Left Lower Leg - Anterior Wound Open Wounding Event: Gradually Appeared Status: Date Acquired: 11/15/2017 Comorbid Anemia, Hypertension, Type II Diabetes, Weeks Of Treatment: 58 History: Paraplegia Clustered Wound: Yes Photos Wound Measurements Length: (cm) 13 % Reductio Width: (cm) 5.5 % Reductio Depth: (cm) 0.1 Epithelial Clustered Quantity: 10 Tunneling: Area: (cm) 56.156 Undermini Volume: (cm) 5.616 Wound Description Classification: Grade 2 Foul Odor Wound Margin: Flat and Intact Slough/Fi Exudate Amount: Small Exudate Type: Serosanguineous Exudate Color: red, brown Wound Bed Granulation Amount: Large (67-100%) Granulation Quality: Red, Hyper-granulation, Friable Fascia Ex Necrotic Amount: None Present (0%) Fat  Layer Tendon Ex Muscle Ex Joint Exp Bone Expo After Cleansing: No brino No Exposed Structure posed: No (Subcutaneous Tissue) Exposed: Yes posed: No posed:  No osed: No sed: No n in Area: -175.4% n in Volume: -175.4% ization: Large (67-100%) No ng: No Treatment Notes Wound #8 (Left, Anterior Lower Leg) 2. Periwound Care Moisturizing lotion 3. Primary Dressing Applied Other primary dressing (specifiy in notes) 4. Secondary Dressing Dry Gauze Roll Gauze Notes vaseline gauze Electronic Signature(s) Signed: 02/13/2019 4:26:31 PM By: Mikeal Hawthorne EMT/HBOT Previous Signature: 02/12/2019 5:20:11 PM Version By: Kela Millin Entered By: Mikeal Hawthorne on 02/13/2019 10:20:33 -------------------------------------------------------------------------------- Vitals Details Patient Name: Date of Service: GUERINO, CAPORALE 02/12/2019 10:45 AM Medical Record [id number] Patient Account Number: [id number] Date of Birth/Sex: Treating RN: 06-04-1945 (74 y.o. [name] [name] Primary Care Sharisse Rantz: Beckie Salts Other Clinician: Referring Shyne Lehrke: Treating Othel Dicostanzo/Extender:Robson, Precious Haws, MIKE Weeks in Treatment: 40 Vital Signs Time Taken: 11:00 Temperature (F): 97.7 Height (in): 67 Pulse (bpm): 79 Weight (lbs): 130 Respiratory Rate (breaths/min): 19 Body Mass Index (BMI): 20.4 Blood Pressure (mmHg): 129/82 Reference Range: 80 - 120 mg / dl Electronic Signature(s) Signed: 02/12/2019 5:20:11 PM By: Kela Millin Entered By: Kela Millin on 02/12/2019 11:00:42

## 2019-04-09 ENCOUNTER — Encounter (HOSPITAL_BASED_OUTPATIENT_CLINIC_OR_DEPARTMENT_OTHER): Payer: PRIVATE HEALTH INSURANCE | Attending: Internal Medicine | Admitting: Internal Medicine

## 2019-05-11 ENCOUNTER — Ambulatory Visit (HOSPITAL_BASED_OUTPATIENT_CLINIC_OR_DEPARTMENT_OTHER): Payer: Medicare Other | Admitting: Internal Medicine

## 2019-05-20 ENCOUNTER — Encounter (HOSPITAL_BASED_OUTPATIENT_CLINIC_OR_DEPARTMENT_OTHER): Payer: Medicare Other | Attending: Physician Assistant | Admitting: Physician Assistant

## 2019-05-20 ENCOUNTER — Other Ambulatory Visit (HOSPITAL_COMMUNITY)
Admission: RE | Admit: 2019-05-20 | Discharge: 2019-05-20 | Disposition: A | Discharge status: Home / Self Care | Payer: Medicare Other | Source: Other Acute Inpatient Hospital | Attending: Physician Assistant | Admitting: Physician Assistant

## 2019-05-20 DIAGNOSIS — B999 Unspecified infectious disease: Secondary | ICD-10-CM | POA: Insufficient documentation

## 2019-05-20 DIAGNOSIS — L98492 Non-pressure chronic ulcer of skin of other sites with fat layer exposed: Secondary | ICD-10-CM | POA: Diagnosis not present

## 2019-05-20 DIAGNOSIS — L97822 Non-pressure chronic ulcer of other part of left lower leg with fat layer exposed: Secondary | ICD-10-CM | POA: Diagnosis not present

## 2019-05-20 DIAGNOSIS — G8250 Quadriplegia, unspecified: Secondary | ICD-10-CM | POA: Insufficient documentation

## 2019-05-20 DIAGNOSIS — L97812 Non-pressure chronic ulcer of other part of right lower leg with fat layer exposed: Secondary | ICD-10-CM | POA: Insufficient documentation

## 2019-05-20 DIAGNOSIS — E11622 Type 2 diabetes mellitus with other skin ulcer: Secondary | ICD-10-CM | POA: Insufficient documentation

## 2019-05-20 DIAGNOSIS — L89322 Pressure ulcer of left buttock, stage 2: Secondary | ICD-10-CM | POA: Insufficient documentation

## 2019-05-20 NOTE — Progress Notes (Signed)
[name], [name] ([id number]) Visit Report for 05/20/2019 Arrival Information Details Patient Name: Date of Service: [name] [name], [name] [name] [place] 05/20/2019 2:15 PM Medical Record Number: [id number] Patient Account Number: [id number] Date of Birth/Sex: Treating RN: [birth year] (74 y.o. [name] [name] Primary Care [name] [name]: [name] [name], MIKE Other Clinician: Referring [name] [name]: Treating [name] [name]/Extender: [name] [name], MIKE Weeks in Treatment: 36 Visit Information History Since Last Visit Added or deleted any medications: No Patient Arrived: Stretcher Any new allergies or adverse reactions: No Arrival Time: 14:15 Had a fall or experienced change in No Accompanied By: self activities of daily living that may affect Transfer Assistance: Stretcher risk of falls: Patient Identification Verified: Yes Signs or symptoms of abuse/neglect since last visito No Secondary Verification Process Completed: Yes Hospitalized since last visit: No Patient Requires Transmission-Based Precautions: No Implantable device outside of the clinic excluding No Patient Has Alerts: Yes cellular tissue based products placed in the center Patient Alerts: R ABI non compressible since last visit: Has Dressing in Place as Prescribed: No Pain Present Now: No Electronic Signature(s) Signed: 05/20/2019 4:36:41 PM By: Deon Pilling Entered By: Deon Pilling on 05/20/2019 14:23:56 -------------------------------------------------------------------------------- Clinic Level of Care Assessment Details Patient Name: Date of Service: [name], MAFFEI [name] 05/20/2019 2:15 PM Medical Record Number: [id number] Patient Account Number: [id number] Date of Birth/Sex: Treating RN: 08-26-1945 (74 y.o. [name] [name] Primary Care [name] [name]: [name] [name], MIKE Other Clinician: Referring [name] [name]: Treating [name] [name]/Extender: [name] [name], MIKE Weeks in Treatment: 36 Clinic Level of Care Assessment Items TOOL 4 Quantity Score '[]'  -  0 Use when only an EandM is performed on FOLLOW-UP visit ASSESSMENTS - Nursing Assessment / Reassessment X- 1 10 Reassessment of Co-morbidities (includes updates in patient status) X- 1 5 Reassessment of Adherence to Treatment Plan ASSESSMENTS - Wound and Skin A ssessment / Reassessment '[]'  - 0 Simple Wound Assessment / Reassessment - one wound X- 5 5 Complex Wound Assessment / Reassessment - multiple wounds '[]'  - 0 Dermatologic / Skin Assessment (not related to wound area) ASSESSMENTS - Focused Assessment X- 2 5 Circumferential Edema Measurements - multi extremities '[]'  - 0 Nutritional Assessment / Counseling / Intervention X- 1 5 Lower Extremity Assessment (monofilament, tuning fork, pulses) '[]'  - 0 Peripheral Arterial Disease Assessment (using hand held doppler) ASSESSMENTS - Ostomy and/or Continence Assessment and Care '[]'  - 0 Incontinence Assessment and Management '[]'  - 0 Ostomy Care Assessment and Management (repouching, etc.) PROCESS - Coordination of Care X - Simple Patient / Family Education for ongoing care 1 15 '[]'  - 0 Complex (extensive) Patient / Family Education for ongoing care X- 1 10 Staff obtains Programmer, systems, Records, T Results / Process Orders est '[]'  - 0 Staff telephones HHA, Nursing Homes / Clarify orders / etc '[]'  - 0 Routine Transfer to another Facility (non-emergent condition) '[]'  - 0 Routine Hospital Admission (non-emergent condition) '[]'  - 0 New Admissions / Biomedical engineer / Ordering NPWT Apligraf, etc. , '[]'  - 0 Emergency Hospital Admission (emergent condition) X- 1 10 Simple Discharge Coordination '[]'  - 0 Complex (extensive) Discharge Coordination PROCESS - Special Needs '[]'  - 0 Pediatric / Minor Patient Management '[]'  - 0 Isolation Patient Management '[]'  - 0 Hearing / Language / Visual special needs '[]'  - 0 Assessment of Community assistance (transportation, D/C planning, etc.) '[]'  - 0 Additional assistance / Altered mentation '[]'  -  0 Support Surface(s) Assessment (bed, cushion, seat, etc.) INTERVENTIONS - Wound Cleansing / Measurement '[]'  - 0 Simple Wound Cleansing - one wound X- 5 5 Complex  Wound Cleansing - multiple wounds X- 1 5 Wound Imaging (photographs - any number of wounds) '[]'  - 0 Wound Tracing (instead of photographs) '[]'  - 0 Simple Wound Measurement - one wound X- 5 5 Complex Wound Measurement - multiple wounds INTERVENTIONS - Wound Dressings X - Small Wound Dressing one or multiple wounds 5 10 '[]'  - 0 Medium Wound Dressing one or multiple wounds '[]'  - 0 Large Wound Dressing one or multiple wounds X- 1 5 Application of Medications - topical '[]'  - 0 Application of Medications - injection INTERVENTIONS - Miscellaneous '[]'  - 0 External ear exam '[]'  - 0 Specimen Collection (cultures, biopsies, blood, body fluids, etc.) '[]'  - 0 Specimen(s) / Culture(s) sent or taken to Lab for analysis '[]'  - 0 Patient Transfer (multiple staff / Civil Service fast streamer / Similar devices) '[]'  - 0 Simple Staple / Suture removal (25 or less) '[]'  - 0 Complex Staple / Suture removal (26 or more) '[]'  - 0 Hypo / Hyperglycemic Management (close monitor of Blood Glucose) '[]'  - 0 Ankle / Brachial Index (ABI) - do not check if billed separately X- 1 5 Vital Signs Has the patient been seen at the hospital within the last three years: Yes Total Score: 205 Level Of Care: New/Established - Level 5 Electronic Signature(s) Signed: 05/20/2019 5:03:53 PM By: Baruch Gouty RN, BSN Entered By: Baruch Gouty on 05/20/2019 15:16:23 -------------------------------------------------------------------------------- Encounter Discharge Information Details Patient Name: Date of Service: [name] [name] 05/20/2019 2:15 PM Medical Record Number: [id number] Patient Account Number: [id number] Date of Birth/Sex: Treating RN: 06/22/1945 (73 y.o. [name] [name]) [name] [name] Primary Care Alanmichael Barmore: [name] [name], MIKE Other Clinician: Referring Agata Lucente: Treating  Fraser Busche/Extender: Lum Babe SSEMI, MIKE Weeks in Treatment: 90 Encounter Discharge Information Items Discharge Condition: Stable Ambulatory Status: Stretcher Discharge Destination: Home Transportation: Private Auto Accompanied By: Vickii Penna Schedule Follow-up Appointment: Yes Clinical Summary of Care: Patient Declined Electronic Signature(s) Signed: 05/20/2019 4:37:47 PM By: [name] Coria RN Entered By: [name] [name] on 05/20/2019 15:35:05 -------------------------------------------------------------------------------- Lower Extremity Assessment Details Patient Name: Date of Service: KYVON, HU RD 05/20/2019 2:15 PM Medical Record Number: [id number] Patient Account Number: [id number] Date of Birth/Sex: Treating RN: 12/24/1945 (74 y.o. [name] [name] Primary Care Kimberleigh Mehan: [name] [name], MIKE Other Clinician: Referring Harvy Riera: Treating Raynee Mccasland/Extender: [name] [name], MIKE Weeks in Treatment: 72 Edema Assessment Assessed: [Left: Yes] [Right: Yes] Edema: [Left: No] [Right: No] Calf Left: Right: Point of Measurement: cm From Medial Instep 37 cm 35 cm Ankle Left: Right: Point of Measurement: cm From Medial Instep 18 cm 18 cm Electronic Signature(s) Signed: 05/20/2019 4:36:41 PM By: Deon Pilling Signed: 05/20/2019 5:03:53 PM By: Baruch Gouty RN, BSN Entered By: Deon Pilling on 05/20/2019 14:24:45 -------------------------------------------------------------------------------- [place] Details Patient Name: Date of Service: [name] Schultze [place] 05/20/2019 2:15 PM Medical Record Number: [id number] Patient Account Number: [id number] Date of Birth/Sex: Treating RN: Nov 24, 1945 (74 y.o. [name] [name] Primary Care Zaakirah Kistner: [name] [name], MIKE Other Clinician: Referring Isela Stantz: Treating Kenzlie Disch/Extender: [name] [name], MIKE Weeks in Treatment: 57 Active Inactive Abuse / Safety / Falls / Self Care Management Nursing  Diagnoses: Impaired physical mobility Potential for injury related to transfers Goals: Patient will not develop complications from immobility Date Initiated: 12/31/2017 Date Inactivated: 02/10/2018 Target Resolution Date: 01/31/2018 Goal Status: Met Patient/caregiver will verbalize understanding of skin care regimen Date Initiated: 12/31/2017 Target Resolution Date: 06/17/2019 Goal Status: Active Patient/caregiver will verbalize/demonstrate measures taken to prevent injury and/or falls Date Initiated: 12/31/2017 Date Inactivated: 02/10/2018 Target Resolution Date: 01/31/2018  Goal Status: Met Interventions: Assess Activities of Daily Living upon admission and as needed Assess fall risk on admission and as needed Assess: immobility, friction, shearing, incontinence upon admission and as needed Assess impairment of mobility on admission and as needed per policy Assess personal safety and home safety (as indicated) on admission and as needed Assess self care needs on admission and as needed Notes: Wound/Skin Impairment Nursing Diagnoses: Knowledge deficit related to ulceration/compromised skin integrity Goals: Patient/caregiver will verbalize understanding of skin care regimen Date Initiated: 05/20/2019 Target Resolution Date: 06/17/2019 Goal Status: Active Ulcer/skin breakdown will have a volume reduction of 30% by week 4 Date Initiated: 12/31/2017 Date Inactivated: 05/20/2019 Target Resolution Date: 06/18/2018 Goal Status: Met Interventions: Assess patient/caregiver ability to obtain necessary supplies Assess patient/caregiver ability to perform ulcer/skin care regimen upon admission and as needed Assess ulceration(s) every visit Notes: Electronic Signature(s) Signed: 05/20/2019 5:03:53 PM By: Baruch Gouty RN, BSN Entered By: Baruch Gouty on 05/20/2019 15:14:18 -------------------------------------------------------------------------------- Pain Assessment Details Patient  Name: Date of Service: [name] Schultze [place] 05/20/2019 2:15 PM Medical Record Number: [id number] Patient Account Number: [id number] Date of Birth/Sex: Treating RN: 19-Apr-1945 (74 y.o. [name] [name] Primary Care Blakeleigh Domek: [name] [name], MIKE Other Clinician: Referring Katherin Ramey: Treating Ameena Vesey/Extender: [name] [name], MIKE Weeks in Treatment: 79 Active Problems Location of Pain Severity and Description of Pain Patient Has Paino No Site Locations Rate the pain. Current Pain Level: 0 Pain Management and Medication Current Pain Management: Medication: No Cold Application: No Rest: No Massage: No Activity: No T.E.N.S.: No Heat Application: No Leg drop or elevation: No Is the Current Pain Management Adequate: Adequate How does your wound impact your activities of daily livingo Sleep: No Bathing: No Appetite: No Relationship With Others: No Bladder Continence: No Emotions: No Bowel Continence: No Work: No Toileting: No Drive: No Dressing: No Hobbies: No Electronic Signature(s) Signed: 05/20/2019 4:36:41 PM By: Deon Pilling Signed: 05/20/2019 5:03:53 PM By: Baruch Gouty RN, BSN Entered By: Deon Pilling on 05/20/2019 14:24:22 -------------------------------------------------------------------------------- Patient/Caregiver Education Details Patient Name: Date of Service: [name] Schultze RD 5/5/2021andnbsp2:15 PM Medical Record Number: [id number] Patient Account Number: [id number] Date of Birth/Gender: Treating RN: 11-Dec-1945 (74 y.o. [name] [name] Primary Care Physician: [name] [name], MIKE Other Clinician: Referring Physician: Treating Physician/Extender: [name] [name], MIKE Weeks in Treatment: 27 Education Assessment Education Provided To: Patient Education Topics Provided Infection: Methods: Explain/Verbal Responses: Reinforcements needed, State content correctly Wound/Skin Impairment: Methods: Explain/Verbal Responses: Reinforcements  needed, State content correctly Electronic Signature(s) Signed: 05/20/2019 5:03:53 PM By: Baruch Gouty RN, BSN Entered By: Baruch Gouty on 05/20/2019 15:15:24 -------------------------------------------------------------------------------- Wound Assessment Details Patient Name: Date of Service: [name] Schultze RD 05/20/2019 2:15 PM Medical Record Number: [id number] Patient Account Number: [id number] Date of Birth/Sex: Treating RN: July 27, 1945 (74 y.o. [name] [name] Primary Care Chrisanna Mishra: [name] [name], MIKE Other Clinician: Referring Nevah Dalal: Treating Shanicka Oldenkamp/Extender: [name] [name], MIKE Weeks in Treatment: 2 Wound Status Wound Number: 1 Primary Etiology: Atypical Wound Location: Right Hand - Dorsum Wound Status: Open Wounding Event: Gradually Appeared Comorbid History: Anemia, Hypertension, Type II Diabetes, Paraplegia Date Acquired: 11/15/2017 Weeks Of Treatment: 72 Clustered Wound: Yes Wound Measurements Length: (cm) 18 Width: (cm) 14.5 Depth: (cm) 0.1 Clustered Quantity: 10 Area: (cm) 204.989 Volume: (cm) 20.499 % Reduction in Area: -542.9% % Reduction in Volume: -542.8% Epithelialization: Medium (34-66%) Tunneling: No Undermining: No Wound Description Classification: Full Thickness Without Exposed Support Stru Wound Margin: Flat and Intact Exudate Amount: Medium Exudate Type: Serous Exudate Color: amber  ctures Foul Odor After Cleansing: No Slough/Fibrino Yes Wound Bed Granulation Amount: Large (67-100%) Exposed Structure Granulation Quality: Red Fascia Exposed: No Necrotic Amount: Small (1-33%) Fat Layer (Subcutaneous Tissue) Exposed: Yes Necrotic Quality: Adherent Slough Tendon Exposed: No Muscle Exposed: No Joint Exposed: No Bone Exposed: No Electronic Signature(s) Signed: 05/20/2019 4:36:41 PM By: Deon Pilling Signed: 05/20/2019 5:03:53 PM By: Baruch Gouty RN, BSN Entered By: Deon Pilling on 05/20/2019  14:21:00 -------------------------------------------------------------------------------- Wound Assessment Details Patient Name: Date of Service: [name] Schultze [place] 05/20/2019 2:15 PM Medical Record Number: [id number] Patient Account Number: [id number] Date of Birth/Sex: Treating RN: 13-Sep-1945 (74 y.o. [name] [name] Primary Care Michaeleen Down: [name] [name], MIKE Other Clinician: Referring Gabbie Marzo: Treating Shamond Skelton/Extender: [name] [name], MIKE Weeks in Treatment: 67 Wound Status Wound Number: 14 Primary Etiology: Atypical Wound Location: Left, Proximal, Anterior Lower Leg Wound Status: Open Wounding Event: Gradually Appeared Comorbid History: Anemia, Hypertension, Type II Diabetes, Paraplegia Date Acquired: 05/20/2019 Weeks Of Treatment: 0 Clustered Wound: No Wound Measurements Length: (cm) 0.3 Width: (cm) 0.3 Depth: (cm) 0.1 Area: (cm) 0.071 Volume: (cm) 0.007 % Reduction in Area: % Reduction in Volume: Epithelialization: None Tunneling: No Undermining: No Wound Description Classification: Full Thickness Without Exposed Support Structures Wound Margin: Distinct, outline attached Exudate Amount: Medium Exudate Type: Serosanguineous Exudate Color: red, brown Foul Odor After Cleansing: No Slough/Fibrino No Wound Bed Granulation Amount: Large (67-100%) Exposed Structure Granulation Quality: Red, Pink, Hyper-granulation Fascia Exposed: No Necrotic Amount: None Present (0%) Fat Layer (Subcutaneous Tissue) Exposed: Yes Tendon Exposed: No Muscle Exposed: No Joint Exposed: No Bone Exposed: No Electronic Signature(s) Signed: 05/20/2019 4:36:41 PM By: Deon Pilling Signed: 05/20/2019 5:03:53 PM By: Baruch Gouty RN, BSN Entered By: Deon Pilling on 05/20/2019 14:20:35 -------------------------------------------------------------------------------- Wound Assessment Details Patient Name: Date of Service: [name] Schultze [place] 05/20/2019 2:15 PM Medical Record Number:  [id number] Patient Account Number: [id number] Date of Birth/Sex: Treating RN: 26-Oct-1945 (74 y.o. [name] [name] Primary Care Xan Ingraham: [name] [name], MIKE Other Clinician: Referring Lothar Prehn: Treating Emmelia Holdsworth/Extender: [name] [name], MIKE Weeks in Treatment: 63 Wound Status Wound Number: 5 Primary Etiology: Diabetic Wound/Ulcer of the Lower Extremity Wound Location: Right, Anterior Lower Leg Wound Status: Open Wounding Event: Gradually Appeared Comorbid History: Anemia, Hypertension, Type II Diabetes, Paraplegia Date Acquired: 11/15/2017 Weeks Of Treatment: 72 Clustered Wound: Yes Wound Measurements Length: (cm) 9 Width: (cm) 5.5 Depth: (cm) 0.1 Clustered Quantity: 7 Area: (cm) 38.877 Volume: (cm) 3.888 % Reduction in Area: 66.3% % Reduction in Volume: 66.3% Epithelialization: Large (67-100%) Tunneling: No Undermining: No Wound Description Classification: Grade 2 Wound Margin: Flat and Intact Exudate Amount: Medium Exudate Type: Serosanguineous Exudate Color: red, brown Foul Odor After Cleansing: No Slough/Fibrino No Wound Bed Granulation Amount: Large (67-100%) Exposed Structure Granulation Quality: Red, Hyper-granulation, Friable Fascia Exposed: No Necrotic Amount: None Present (0%) Fat Layer (Subcutaneous Tissue) Exposed: Yes Tendon Exposed: No Muscle Exposed: No Joint Exposed: No Bone Exposed: No Electronic Signature(s) Signed: 05/20/2019 4:36:41 PM By: Deon Pilling Signed: 05/20/2019 5:03:53 PM By: Baruch Gouty RN, BSN Entered By: Deon Pilling on 05/20/2019 14:22:01 -------------------------------------------------------------------------------- Wound Assessment Details Patient Name: Date of Service: [name] Schultze [place] 05/20/2019 2:15 PM Medical Record Number: [id number] Patient Account Number: [id number] Date of Birth/Sex: Treating RN: June 18, 1945 (74 y.o. [name] [name] Primary Care Laini Urick: [name] [name], MIKE Other Clinician: Referring  Rahmir Beever: Treating Shauntae Reitman/Extender: [name] [name], MIKE Weeks in Treatment: 43 Wound Status Wound Number: 6 Primary Etiology: Diabetic Wound/Ulcer of the Lower Extremity Wound Location: Right, Lateral, Posterior  Lower Leg Wound Status: Open Wounding Event: Gradually Appeared Comorbid History: Anemia, Hypertension, Type II Diabetes, Paraplegia Date Acquired: 11/15/2017 Weeks Of Treatment: 72 Clustered Wound: Yes Wound Measurements Length: (cm) 6 Width: (cm) 5.5 Depth: (cm) 0.1 Clustered Quantity: 5 Area: (cm) 25.918 Volume: (cm) 2.592 % Reduction in Area: 4.2% % Reduction in Volume: 4.2% Epithelialization: Medium (34-66%) Tunneling: No Undermining: No Wound Description Classification: Grade 2 Wound Margin: Flat and Intact Exudate Amount: Small Exudate Type: Serosanguineous Exudate Color: red, brown Foul Odor After Cleansing: No Slough/Fibrino No Wound Bed Granulation Amount: Large (67-100%) Exposed Structure Granulation Quality: Red, Hyper-granulation Fascia Exposed: No Necrotic Amount: None Present (0%) Fat Layer (Subcutaneous Tissue) Exposed: Yes Tendon Exposed: No Muscle Exposed: No Joint Exposed: No Bone Exposed: No Electronic Signature(s) Signed: 05/20/2019 4:36:41 PM By: Deon Pilling Signed: 05/20/2019 5:03:53 PM By: Baruch Gouty RN, BSN Entered By: Deon Pilling on 05/20/2019 14:22:35 -------------------------------------------------------------------------------- Wound Assessment Details Patient Name: Date of Service: [name] Schultze [place] 05/20/2019 2:15 PM Medical Record Number: [id number] Patient Account Number: [id number] Date of Birth/Sex: Treating RN: May 09, 1945 (74 y.o. [name] [name] Primary Care Eri Mcevers: [name] [name], MIKE Other Clinician: Referring Goldman Birchall: Treating Dyon Rotert/Extender: [name] [name], MIKE Weeks in Treatment: 5 Wound Status Wound Number: 8 Primary Etiology: Diabetic Wound/Ulcer of the Lower  Extremity Wound Location: Left, Anterior Lower Leg Wound Status: Open Wounding Event: Gradually Appeared Comorbid History: Anemia, Hypertension, Type II Diabetes, Paraplegia Date Acquired: 11/15/2017 Weeks Of Treatment: 72 Clustered Wound: Yes Wound Measurements Length: (cm) 0.4 Width: (cm) 0.7 Depth: (cm) 0.1 Clustered Quantity: 2 Area: (cm) 0.22 Volume: (cm) 0.022 % Reduction in Area: 98.9% % Reduction in Volume: 98.9% Epithelialization: Large (67-100%) Tunneling: No Undermining: No Wound Description Classification: Grade 2 Wound Margin: Flat and Intact Exudate Amount: Small Exudate Type: Serosanguineous Exudate Color: red, brown Foul Odor After Cleansing: No Slough/Fibrino No Wound Bed Granulation Amount: Large (67-100%) Exposed Structure Granulation Quality: Red, Hyper-granulation Fascia Exposed: No Necrotic Amount: None Present (0%) Fat Layer (Subcutaneous Tissue) Exposed: Yes Tendon Exposed: No Muscle Exposed: No Joint Exposed: No Bone Exposed: No Electronic Signature(s) Signed: 05/20/2019 4:36:41 PM By: Deon Pilling Signed: 05/20/2019 5:03:53 PM By: Baruch Gouty RN, BSN Entered By: Deon Pilling on 05/20/2019 14:23:16 -------------------------------------------------------------------------------- Vitals Details Patient Name: Date of Service: [name], CLIFFO RD 05/20/2019 2:15 PM Medical Record Number: [id number] Patient Account Number: [id number] Date of Birth/Sex: Treating RN: [birth year] (74 y.o. [name] [name] Primary Care Daleena Rotter: [name] [name], MIKE Other Clinician: Referring Enez Monahan: Treating Jonique Kulig/Extender: [name] [name], MIKE Weeks in Treatment: 19 Vital Signs Time Taken: 14:17 Temperature (F): 98.2 Height (in): 67 Pulse (bpm): 82 Weight (lbs): 130 Respiratory Rate (breaths/min): 18 Body Mass Index (BMI): 20.4 Blood Pressure (mmHg): 148/80 Reference Range: 80 - 120 mg / dl Electronic Signature(s) Signed: 05/20/2019 4:36:41 PM  By: Deon Pilling Entered By: Deon Pilling on 05/20/2019 14:24:12

## 2019-05-20 NOTE — Progress Notes (Addendum)
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 tissue within the wound bed. Wound #8 status is Open. Original cause of wound was Gradually Appeared. The wound is located on the Left,Anterior Lower Leg. The wound measures 0.4cm length x 0.7cm width x 0.1cm depth; 0.22cm^2 area and 0.022cm^3 volume. There is Fat Layer (Subcutaneous Tissue) Exposed exposed. There is no tunneling or undermining noted. There is a small amount of serosanguineous drainage noted. The wound margin is flat and intact. There is large (67-100%) red, hyper - granulation within the wound bed. There is no necrotic tissue within the wound bed. 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 -  daily as needed to drainage Skin Barriers/Peri-Wound Care: Moisturizing lotion - to both legs and feet with dressing changes Wound Cleansing: Clean wound with Wound Cleanser - all wounds Primary Wound Dressing: Wound #1 Right Hand - Dorsum: Other: - vasoline gauze Wound #14 Left,Proximal,Anterior Lower Leg: Other: - vasoline gauze Wound #5 Right,Anterior Lower Leg: Other: - vasoline gauze Wound #6 Right,Lateral,Posterior Lower Leg: Other: - vasoline gauze Wound #8 Left,Anterior Lower Leg: Other: - vasoline gauze Secondary Dressing: Kerlix/Rolled Gauze Dry Gauze - all wounds Edema Control: Elevate legs to the level of the heart or above for 30 minutes daily and/or when sitting, a frequency of: - throughout the day Additional Orders / Instructions: Other: - follow up  with primary care MD for swelling in thighs Laboratory ordered were: Anerobic culture - right hand 1. My suggestion at this time is good to be that we go ahead and initiate treatment with a continuation of Vaseline gauze that seems to have done well for him to be honest. 2. I am also can recommend that we go ahead and continue with moisturizing lotion this been utilized at this point as well. 3. We will continue to wrap this with roll gauze to all wound locations along with dry gauze. This is over top of the Vaseline gauze. We will see patient back for reevaluation in 2 months here in the clinic. If anything worsens or changes patient will contact our office for additional recommendations. Electronic Signature(s) Signed: 05/20/2019 3:21:10 PM By: Worthy Keeler PA-C Entered By: Worthy Keeler on 05/20/2019 15:21:09 -------------------------------------------------------------------------------- SuperBill Details Patient Name: Date of Service: DRAYLON, MERCADEL RD 05/20/2019 Medical Record Number: [id number] Patient Account Number: [id number] Date of Birth/Sex: Treating RN: 1945-12-31 (74 y.o. [name] Primary Care  Provider: Theodora Blow, MIKE Other Clinician: Referring Provider: Treating Provider/Extender: Valorie Roosevelt, MIKE Weeks in Treatment: 85 Diagnosis Coding ICD-10 Codes Code Description [phone] Non-pressure chronic ulcer of right calf with necrosis of muscle L97.811 Non-pressure chronic ulcer of other part of right lower leg limited to breakdown of skin S61.401D Unspecified open wound of right hand, subsequent encounter L89.322 Pressure ulcer of left buttock, stage 2 Facility Procedures CPT4 Code: [id number] Description: [phone] - WOUND CARE VISIT-LEV 5 EST PT Modifier: Quantity: 1 Physician Procedures : CPT4 Code Description Modifier [id number] - WC PHYS LEVEL 3 - EST PT ICD-10 Diagnosis Description L97.213 Non-pressure chronic ulcer of right calf with necrosis of muscle L97.811 Non-pressure chronic ulcer of other part of right lower leg limited to  breakdown of skin S61.401D Unspecified open wound of right hand, subsequent encounter L89.322 Pressure ulcer of left buttock, stage 2 Quantity: 1 Electronic Signature(s) Signed: 05/20/2019 3:21:24 PM By: Worthy Keeler PA-C Entered By: Worthy Keeler on 05/20/2019 15:21:23

## 2019-05-24 LAB — AEROBIC CULTURE W GRAM STAIN (SUPERFICIAL SPECIMEN): Gram Stain: NONE SEEN

## 2019-05-24 LAB — AEROBIC CULTURE? (SUPERFICIAL SPECIMEN)

## 2019-06-16 ENCOUNTER — Emergency Department (HOSPITAL_COMMUNITY)
Admission: EM | Admit: 2019-06-16 | Discharge: 2019-06-17 | Disposition: A | Discharge status: Home / Self Care | Payer: Medicare Other | Attending: Emergency Medicine | Admitting: Emergency Medicine

## 2019-06-16 ENCOUNTER — Encounter (HOSPITAL_COMMUNITY): Payer: Self-pay | Admitting: Emergency Medicine

## 2019-06-16 ENCOUNTER — Other Ambulatory Visit: Payer: Self-pay

## 2019-06-16 DIAGNOSIS — I10 Essential (primary) hypertension: Secondary | ICD-10-CM | POA: Diagnosis not present

## 2019-06-16 DIAGNOSIS — R52 Pain, unspecified: Secondary | ICD-10-CM

## 2019-06-16 DIAGNOSIS — Z4800 Encounter for change or removal of nonsurgical wound dressing: Secondary | ICD-10-CM | POA: Diagnosis present

## 2019-06-16 DIAGNOSIS — Z79899 Other long term (current) drug therapy: Secondary | ICD-10-CM | POA: Diagnosis not present

## 2019-06-16 DIAGNOSIS — E119 Type 2 diabetes mellitus without complications: Secondary | ICD-10-CM | POA: Diagnosis not present

## 2019-06-16 DIAGNOSIS — Z7984 Long term (current) use of oral hypoglycemic drugs: Secondary | ICD-10-CM | POA: Insufficient documentation

## 2019-06-16 DIAGNOSIS — Z5189 Encounter for other specified aftercare: Secondary | ICD-10-CM

## 2019-06-16 NOTE — ED Triage Notes (Signed)
BIB EMS from home c/o wound on palm of right hand which is currently being treated at a wound care center. Reports swelling in the hand, but denying pain. 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 Pts daughter is primary care taker.  126/82 94 HR 20 RR 97% RA  202 CBG

## 2019-06-17 ENCOUNTER — Emergency Department (HOSPITAL_COMMUNITY): Payer: Medicare Other

## 2019-06-17 ENCOUNTER — Encounter (HOSPITAL_COMMUNITY): Payer: Self-pay | Admitting: Emergency Medicine

## 2019-06-17 DIAGNOSIS — Z4800 Encounter for change or removal of nonsurgical wound dressing: Secondary | ICD-10-CM | POA: Diagnosis not present

## 2019-06-17 LAB — CBC WITH DIFFERENTIAL/PLATELET
Abs Immature Granulocytes: 0.01 10*3/uL (ref 0.00–0.07)
Basophils Absolute: 0 10*3/uL (ref 0.0–0.1)
Basophils Relative: 0 %
Eosinophils Absolute: 0.2 10*3/uL (ref 0.0–0.5)
Eosinophils Relative: 4 %
HCT: 33.3 % — ABNORMAL LOW (ref 39.0–52.0)
Hemoglobin: 10.4 g/dL — ABNORMAL LOW (ref 13.0–17.0)
Immature Granulocytes: 0 %
Lymphocytes Relative: 29 %
Lymphs Abs: 1.6 10*3/uL (ref 0.7–4.0)
MCH: 31.9 pg (ref 26.0–34.0)
MCHC: 31.2 g/dL (ref 30.0–36.0)
MCV: 102.1 fL — ABNORMAL HIGH (ref 80.0–100.0)
Monocytes Absolute: 0.6 10*3/uL (ref 0.1–1.0)
Monocytes Relative: 11 %
Neutro Abs: 3.2 10*3/uL (ref 1.7–7.7)
Neutrophils Relative %: 56 %
Platelets: 185 10*3/uL (ref 150–400)
RBC: 3.26 MIL/uL — ABNORMAL LOW (ref 4.22–5.81)
RDW: 15.1 % (ref 11.5–15.5)
WBC: 5.7 10*3/uL (ref 4.0–10.5)
nRBC: 0 % (ref 0.0–0.2)

## 2019-06-17 LAB — I-STAT CHEM 8, ED
BUN: 29 mg/dL — ABNORMAL HIGH (ref 8–23)
Calcium, Ion: 1.23 mmol/L (ref 1.15–1.40)
Chloride: 110 mmol/L (ref 98–111)
Creatinine, Ser: 0.9 mg/dL (ref 0.61–1.24)
Glucose, Bld: 156 mg/dL — ABNORMAL HIGH (ref 70–99)
HCT: 32 % — ABNORMAL LOW (ref 39.0–52.0)
Hemoglobin: 10.9 g/dL — ABNORMAL LOW (ref 13.0–17.0)
Potassium: 4.1 mmol/L (ref 3.5–5.1)
Sodium: 144 mmol/L (ref 135–145)
TCO2: 20 mmol/L — ABNORMAL LOW (ref 22–32)

## 2019-06-17 IMAGING — CR DG HAND 2V*R*
2 series · 2 of 2 positions shown · non-contrast
Comparison: [DATE]

CLINICAL DATA: Necrotic hand

EXAM:
RIGHT HAND - 2 VIEW

[x hand pa right]
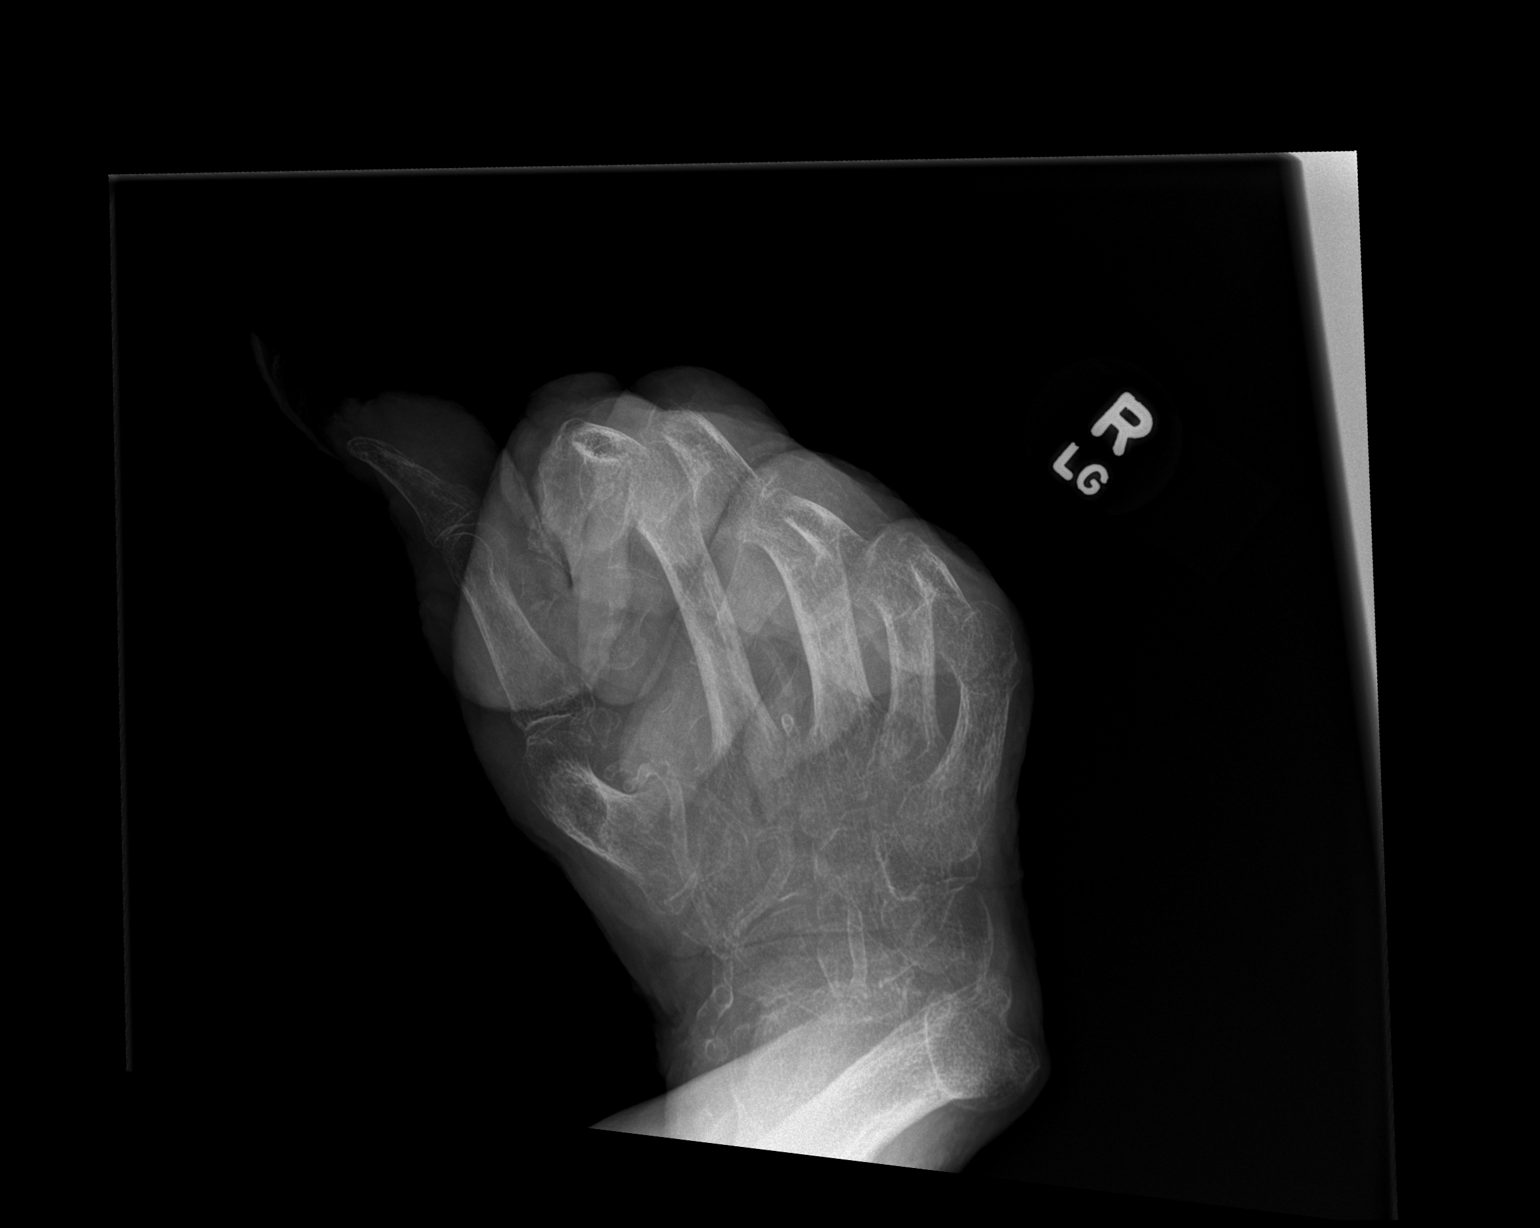

[x hand lat right]
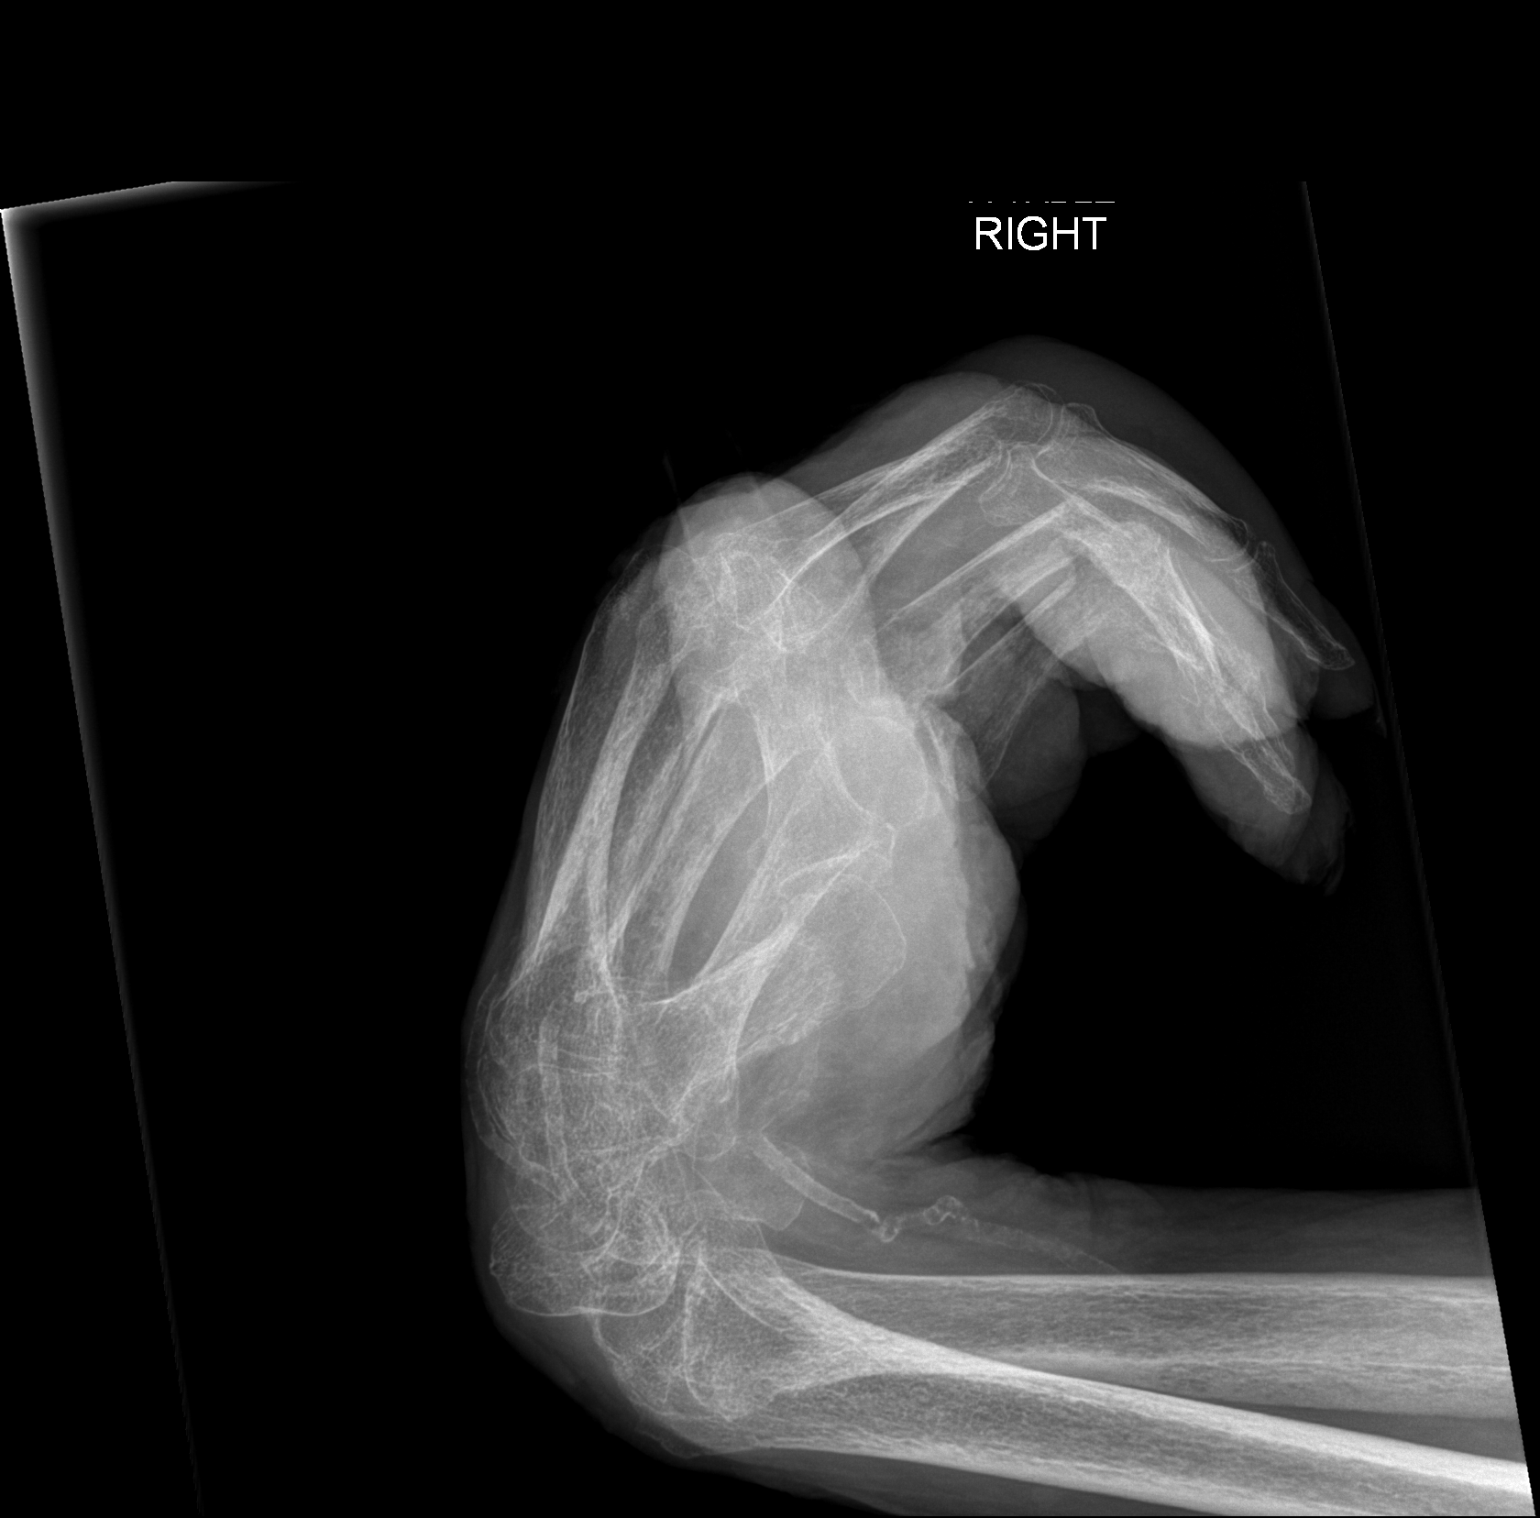

[2 of 2 positions shown; findings below may reference images not displayed]

FINDINGS: Considerable contractions are again identified. No definitive
fracture is seen. No bony erosive changes to suggest osteomyelitis
are noted at this time.
IMPRESSION: Flexion contracture similar to that noted on the prior exam. No
fracture or bony erosive changes are seen.

## 2019-06-17 MED ORDER — DOXYCYCLINE HYCLATE 100 MG PO TABS
100.0000 mg | ORAL_TABLET | Freq: Once | ORAL | Status: AC
  Administered 2019-06-17: 100 mg via ORAL
  Filled 2019-06-17: qty 1

## 2019-06-17 MED ORDER — DOXYCYCLINE HYCLATE 100 MG PO CAPS
100.0000 mg | ORAL_CAPSULE | Freq: Two times a day (BID) | ORAL | 0 refills | Status: DC
Start: 2019-06-17 — End: 2020-01-11

## 2019-06-17 MED ORDER — CEPHALEXIN 500 MG PO CAPS
500.0000 mg | ORAL_CAPSULE | Freq: Four times a day (QID) | ORAL | 0 refills | Status: DC
Start: 2019-06-17 — End: 2020-01-11

## 2019-06-17 MED ORDER — BACITRACIN ZINC 500 UNIT/GM EX OINT
TOPICAL_OINTMENT | Freq: Two times a day (BID) | CUTANEOUS | Status: DC
  Filled 2019-06-17: qty 0.9

## 2019-06-17 NOTE — ED Notes (Signed)
[facility] called for transport home. 
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
## 2019-06-17 NOTE — ED Notes (Signed)
[facility] here to take patient home, daughter was called

## 2019-06-25 ENCOUNTER — Encounter (HOSPITAL_BASED_OUTPATIENT_CLINIC_OR_DEPARTMENT_OTHER): Payer: Medicare Other | Attending: Physician Assistant | Admitting: Internal Medicine

## 2019-06-25 ENCOUNTER — Other Ambulatory Visit: Payer: Self-pay

## 2019-06-25 DIAGNOSIS — L89322 Pressure ulcer of left buttock, stage 2: Secondary | ICD-10-CM | POA: Diagnosis not present

## 2019-06-25 DIAGNOSIS — L97821 Non-pressure chronic ulcer of other part of left lower leg limited to breakdown of skin: Secondary | ICD-10-CM | POA: Insufficient documentation

## 2019-06-25 DIAGNOSIS — I10 Essential (primary) hypertension: Secondary | ICD-10-CM | POA: Diagnosis not present

## 2019-06-25 DIAGNOSIS — G8250 Quadriplegia, unspecified: Secondary | ICD-10-CM | POA: Insufficient documentation

## 2019-06-25 DIAGNOSIS — L97213 Non-pressure chronic ulcer of right calf with necrosis of muscle: Secondary | ICD-10-CM | POA: Insufficient documentation

## 2019-06-25 DIAGNOSIS — L97219 Non-pressure chronic ulcer of right calf with unspecified severity: Secondary | ICD-10-CM | POA: Diagnosis present

## 2019-06-25 DIAGNOSIS — X58XXXD Exposure to other specified factors, subsequent encounter: Secondary | ICD-10-CM | POA: Diagnosis not present

## 2019-06-25 DIAGNOSIS — S61401D Unspecified open wound of right hand, subsequent encounter: Secondary | ICD-10-CM | POA: Diagnosis not present

## 2019-06-25 DIAGNOSIS — L97811 Non-pressure chronic ulcer of other part of right lower leg limited to breakdown of skin: Secondary | ICD-10-CM | POA: Diagnosis not present

## 2019-06-29 NOTE — Progress Notes (Signed)
[name] [name], [name] [name] ([id number]) Visit Report for 06/25/2019 Arrival Information [name] Patient Name: Date of Service: [name] [name], [name] [name] [name] 06/25/2019 1:15 PM Medical Record Number: [id number] Patient Account Number: [id number] Date of Birth/Sex: Treating RN: 11-17-1945 (74 y.o. [name] Primary Care Dellar Traber: Theodora Blow, MIKE Other Clinician: Referring Shaquel Chavous: Treating Maaliyah Adolph/Extender: Joanne Chars, MIKE Weeks in Treatment: 7 Visit Information History Since Last Visit Added or deleted any medications: Yes Patient Arrived: Stretcher Any new allergies or adverse reactions: No Arrival Time: 12:50 Had a fall or experienced change in No Accompanied By: alone activities of daily living that may affect Transfer Assistance: Stretcher risk of falls: Patient Identification Verified: Yes Signs or symptoms of abuse/neglect since last visito No Secondary Verification Process Completed: Yes Hospitalized since last visit: No Patient Requires Transmission-Based Precautions: No Implantable device outside of the clinic excluding No Patient Has Alerts: Yes cellular tissue based products placed in the center Patient Alerts: R ABI non compressible since last visit: Has Dressing in Place as Prescribed: Yes Pain Present Now: No Electronic Signature(s) Signed: 06/25/2019 5:18:16 PM By: Levan Hurst RN, BSN Entered By: Levan Hurst on 06/25/2019 13:07:52 -------------------------------------------------------------------------------- Clinic Level of Care Assessment [name] Patient Name: Date of Service: [name] [name], [name] [name] [name] 06/25/2019 1:15 PM Medical Record Number: [id number] Patient Account Number: [id number] Date of Birth/Sex: Treating RN: Jan 19, 1945 (74 y.o. [name], [name] Primary Care Toya Palacios: Theodora Blow, MIKE Other Clinician: Referring Sharyn Brilliant: Treating Ellaina Schuler/Extender: Joanne Chars, MIKE Weeks in Treatment: 54 Clinic Level of Care Assessment Items TOOL 4 Quantity  Score X- 1 0 Use when only an EandM is performed on FOLLOW-UP visit ASSESSMENTS - Nursing Assessment / Reassessment X- 1 10 Reassessment of Co-morbidities (includes updates in patient status) X- 1 5 Reassessment of Adherence to Treatment Plan ASSESSMENTS - Wound and Skin A ssessment / Reassessment '[]'  - 0 Simple Wound Assessment / Reassessment - one wound X- 5 5 Complex Wound Assessment / Reassessment - multiple wounds X- 1 10 Dermatologic / Skin Assessment (not related to wound area) ASSESSMENTS - Focused Assessment '[]'  - 0 Circumferential Edema Measurements - multi extremities X- 1 10 Nutritional Assessment / Counseling / Intervention '[]'  - 0 Lower Extremity Assessment (monofilament, tuning fork, pulses) '[]'  - 0 Peripheral Arterial Disease Assessment (using hand held doppler) ASSESSMENTS - Ostomy and/or Continence Assessment and Care '[]'  - 0 Incontinence Assessment and Management '[]'  - 0 Ostomy Care Assessment and Management (repouching, etc.) PROCESS - Coordination of Care '[]'  - 0 Simple Patient / Family Education for ongoing care X- 1 20 Complex (extensive) Patient / Family Education for ongoing care X- 1 10 Staff obtains Programmer, systems, Records, T Results / Process Orders est '[]'  - 0 Staff telephones HHA, Nursing Homes / Clarify orders / etc '[]'  - 0 Routine Transfer to another Facility (non-emergent condition) '[]'  - 0 Routine Hospital Admission (non-emergent condition) '[]'  - 0 New Admissions / Biomedical engineer / Ordering NPWT Apligraf, etc. , '[]'  - 0 Emergency Hospital Admission (emergent condition) '[]'  - 0 Simple Discharge Coordination X- 1 15 Complex (extensive) Discharge Coordination PROCESS - Special Needs '[]'  - 0 Pediatric / Minor Patient Management '[]'  - 0 Isolation Patient Management '[]'  - 0 Hearing / Language / Visual special needs '[]'  - 0 Assessment of Community assistance (transportation, D/C planning, etc.) '[]'  - 0 Additional assistance / Altered  mentation '[]'  - 0 Support Surface(s) Assessment (bed, cushion, seat, etc.) INTERVENTIONS - Wound Cleansing / Measurement '[]'  - 0 Simple Wound Cleansing - one wound X- 5 5 Complex Wound  Cleansing - multiple wounds X- 1 5 Wound Imaging (photographs - any number of wounds) '[]'  - 0 Wound Tracing (instead of photographs) '[]'  - 0 Simple Wound Measurement - one wound X- 5 5 Complex Wound Measurement - multiple wounds INTERVENTIONS - Wound Dressings '[]'  - 0 Small Wound Dressing one or multiple wounds '[]'  - 0 Medium Wound Dressing one or multiple wounds X- 3 20 Large Wound Dressing one or multiple wounds '[]'  - 0 Application of Medications - topical '[]'  - 0 Application of Medications - injection INTERVENTIONS - Miscellaneous '[]'  - 0 External ear exam '[]'  - 0 Specimen Collection (cultures, biopsies, blood, body fluids, etc.) '[]'  - 0 Specimen(s) / Culture(s) sent or taken to Lab for analysis '[]'  - 0 Patient Transfer (multiple staff / Civil Service fast streamer / Similar devices) '[]'  - 0 Simple Staple / Suture removal (25 or less) '[]'  - 0 Complex Staple / Suture removal (26 or more) '[]'  - 0 Hypo / Hyperglycemic Management (close monitor of Blood Glucose) '[]'  - 0 Ankle / Brachial Index (ABI) - do not check if billed separately X- 1 5 Vital Signs Has the patient been seen at the hospital within the last three years: Yes Total Score: 225 Level Of Care: New/Established - Level 5 Electronic Signature(s) Signed: 06/25/2019 5:25:48 PM By: Deon Pilling Entered By: Deon Pilling on 06/25/2019 13:40:17 -------------------------------------------------------------------------------- Encounter Discharge Information [name] Patient Name: Date of Service: [name] [name] [name] 06/25/2019 1:15 PM Medical Record Number: [id number] Patient Account Number: [id number] Date of Birth/Sex: Treating RN: December 17, 1945 (74 y.o. [name] Primary Care Channing Savich: Theodora Blow, MIKE Other Clinician: Referring Demetra Moya: Treating  Daren Yeagle/Extender: Joanne Chars, MIKE Weeks in Treatment: 43 Encounter Discharge Information Items Post Procedure Vitals Discharge Condition: Stable Temperature (F): 98.2 Ambulatory Status: Stretcher Pulse (bpm): 111 Discharge Destination: Home Respiratory Rate (breaths/min): 18 Transportation: Ambulance Blood Pressure (mmHg): 141/93 Accompanied By: EMT s Schedule Follow-up Appointment: Yes Clinical Summary of Care: Patient Declined Electronic Signature(s) Signed: 06/25/2019 5:22:56 PM By: Baruch Gouty RN, BSN Entered By: Baruch Gouty on 06/25/2019 14:02:06 -------------------------------------------------------------------------------- Lower Extremity Assessment [name] Patient Name: Date of Service: [name] [name], [name] [name] 06/25/2019 1:15 PM Medical Record Number: [id number] Patient Account Number: [id number] Date of Birth/Sex: Treating RN: 09/03/1945 (74 y.o. [name] Primary Care Kelissa Merlin: Theodora Blow, MIKE Other Clinician: Referring Heidy Mccubbin: Treating Lacrecia Delval/Extender: Joanne Chars, MIKE Weeks in Treatment: 77 Edema Assessment Assessed: [Left: No] [Right: No] Edema: [Left: No] [Right: No] Calf Left: Right: Point of Measurement: cm From Medial Instep 37 cm 35 cm Ankle Left: Right: Point of Measurement: cm From Medial Instep 18 cm 18 cm Vascular Assessment Pulses: Dorsalis Pedis Palpable: [Left:Yes] [Right:Yes] Electronic Signature(s) Signed: 06/25/2019 5:18:16 PM By: Levan Hurst RN, BSN Entered By: Levan Hurst on 06/25/2019 13:08:57 -------------------------------------------------------------------------------- Multi Wound Chart [name] Patient Name: Date of Service: [name] [name] [name] 06/25/2019 1:15 PM Medical Record Number: [id number] Patient Account Number: [id number] Date of Birth/Sex: Treating RN: [birth year] (74 y.o. [name] Primary Care Omir Cooprider: Theodora Blow, MIKE Other Clinician: Referring Kerra Guilfoil: Treating  Yareliz Thorstenson/Extender: Joanne Chars, MIKE Weeks in Treatment: 64 Vital Signs Height(in): 67 Pulse(bpm): 111 Weight(lbs): 130 Blood Pressure(mmHg): 141/93 Body Mass Index(BMI): 20 Temperature(F): 98.2 Respiratory Rate(breaths/min): 18 Photos: [1:No Photos Right Hand - Dorsum] [14:No Photos Left, Proximal, Anterior Lower Leg] [5:No Photos Right, Anterior Lower Leg] Wound Location: [1:Gradually Appeared] [14:Gradually Appeared] [5:Gradually Appeared] Wounding Event: [1:Atypical] [14:Atypical] [5:Diabetic Wound/Ulcer of the Lower] Primary Etiology: [1:Anemia, Hypertension, Type II] [14:Anemia, Hypertension, Type II] [5:Extremity Anemia,  Hypertension, Type II] Comorbid History: [1:Diabetes, Paraplegia 11/15/2017] [14:Diabetes, Paraplegia 05/20/2019] [5:Diabetes, Paraplegia 11/15/2017] Date Acquired: [1:77] [14:5] [5:77] Weeks of Treatment: [1:Open] [14:Open] [5:Open] Wound Status: [1:Yes] [14:No] [5:Yes] Clustered Wound: [1:10] [14:N/A] [5:1] Clustered Quantity: [1:14x7.5x0.1] [14:1.2x1x0.1] [5:0.5x0.6x0.1] Measurements L x W x D (cm) [1:82.467] [14:0.942] [5:0.236] A (cm) : rea [1:8.247] [14:0.094] [5:0.024] Volume (cm) : [1:-158.60%] [14:-1226.80%] [5:99.80%] % Reduction in A [1:rea: -158.60%] [14:-1242.90%] [5:99.80%] % Reduction in Volume: [1:Full Thickness Without Exposed] [14:Full Thickness Without Exposed] [5:Grade 2] Classification: [1:Support Structures Medium] [14:Support Structures Medium] [5:Medium] Exudate A mount: [1:Purulent] [14:Serosanguineous] [5:Serosanguineous] Exudate Type: [1:yellow, brown, green] [14:red, brown] [5:red, brown] Exudate Color: [1:Flat and Intact] [14:Distinct, outline attached] [5:Flat and Intact] Wound Margin: [1:Large (67-100%)] [14:Large (67-100%)] [5:Large (67-100%)] Granulation A mount: [1:Red] [14:Red, Pink] [5:Red, Hyper-granulation] Granulation Quality: [1:None Present (0%)] [14:None Present (0%)] [5:None Present (0%)] Necrotic A  mount: [1:Fat Layer (Subcutaneous Tissue)] [14:Fat Layer (Subcutaneous Tissue)] [5:Fat Layer (Subcutaneous Tissue)] Exposed Structures: [1:Exposed: Yes Fascia: No Tendon: No Muscle: No Joint: No Bone: No Medium (34-66%)] [14:Exposed: Yes Fascia: No Tendon: No Muscle: No Joint: No Bone: No Small (1-33%)] [5:Exposed: Yes Fascia: No Tendon: No Muscle: No Joint: No Bone: No Large (67-100%)] Epithelialization: [1:Chemical/Enzymatic/Mechanical] [14:Chemical/Enzymatic/Mechanical] [5:Chemical/Enzymatic/Mechanical] Debridement: Pre-procedure Verification/Time Out 13:30 [14:13:30] [5:13:30] Taken: [1:Lidocaine 4% Topical Solution] [14:Lidocaine 4% Topical Solution] [5:Lidocaine 4% Topical Solution] Pain Control: [1:N/A] [14:N/A] [5:N/A] Instrument: [1:None] [14:None] [5:None] Bleeding: [1:0] [14:0] [5:0] Procedural Pain: [1:0] [14:0] [5:0] Post Procedural Pain: [1:Procedure was tolerated well] [14:Procedure was tolerated well] [5:Procedure was tolerated well] Debridement Treatment Response: [1:14x7.5x0.1] [14:1.2x1x0.1] [5:0.5x0.6x0.1] Post Debridement Measurements L x W x D (cm) [1:8.247] [14:0.094] [5:0.024] Post Debridement Volume: (cm) [1:Debridement] [14:N/A] [5:N/A] Wound Number: 6 [id number] N/A Photos: No Photos No Photos N/A Right, Lateral, Posterior Lower Leg Left, Anterior Lower Leg N/A Wound Location: Gradually Appeared Gradually Appeared N/A Wounding Event: 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 N/A Primary Etiology: Extremity Extremity Anemia, Hypertension, Type II Anemia, Hypertension, Type II N/A Comorbid History: Diabetes, Paraplegia Diabetes, Paraplegia 11/15/2017 11/15/2017 N/A Date Acquired: 77 77 N/A Weeks of Treatment: Open Open N/A Wound Status: Yes Yes N/A Clustered Wound: 2 2 N/A Clustered Quantity: 1.5x1x0.1 0.8x0.6x0.1 N/A Measurements L x W x D (cm) 1.178 0.377 N/A A (cm) : rea 0.118 0.038 N/A Volume (cm) : 95.60% 98.20% N/A %  Reduction in A rea: 95.60% 98.10% N/A % Reduction in Volume: Grade 2 Grade 2 N/A Classification: Small Small N/A Exudate A mount: Serosanguineous Serosanguineous N/A Exudate Type: red, brown red, brown N/A Exudate Color: Flat and Intact Flat and Intact N/A Wound Margin: Large (67-100%) Large (67-100%) N/A Granulation A mount: Red Red N/A Granulation Quality: None Present (0%) None Present (0%) N/A Necrotic A mount: Fat Layer (Subcutaneous Tissue) Fat Layer (Subcutaneous Tissue) N/A Exposed Structures: Exposed: Yes Exposed: Yes Fascia: No Fascia: No Tendon: No Tendon: No Muscle: No Muscle: No Joint: No Joint: No Bone: No Bone: No Medium (34-66%) Large (67-100%) N/A Epithelialization: Chemical/Enzymatic/Mechanical Chemical/Enzymatic/Mechanical N/A Debridement: Pre-procedure Verification/Time Out 13:30 13:30 N/A Taken: Lidocaine 4% Topical Solution Lidocaine 4% Topical Solution N/A Pain Control: N/A N/A N/A Instrument: None None N/A Bleeding: 0 0 N/A Procedural Pain: 0 0 N/A Post Procedural Pain: Procedure was tolerated well Procedure was tolerated well N/A Debridement Treatment Response: 1.5x1x0.1 0.8x0.6x0.1 N/A Post Debridement Measurements L x W x D (cm) 0.118 0.038 N/A Post Debridement Volume: (cm) N/A N/A N/A Procedures Performed: Treatment Notes Electronic Signature(s) Signed: 06/25/2019 5:25:48 PM By: Deon Pilling Signed: 06/29/2019  8:32:12 AM By: Linton Ham MD Entered By: Linton Ham on 06/25/2019 13:37:38 -------------------------------------------------------------------------------- Multi-Disciplinary Care Plan [name] Patient Name: Date of Service: AARIZ, MAISH [name] 06/25/2019 1:15 PM Medical Record Number: [id number] Patient Account Number: [id number] Date of Birth/Sex: Treating RN: Jun 27, 1945 (74 y.o. [name], [name] Primary Care Teola Felipe: Theodora Blow, MIKE Other Clinician: Referring Lummie Montijo: Treating Jade Burkard/Extender: Joanne Chars, MIKE Weeks in Treatment: 65 Active Inactive Abuse / Safety / Falls / Self Care Management Nursing Diagnoses: Impaired physical mobility Potential for injury related to transfers Goals: Patient will not develop complications from immobility Date Initiated: 12/31/2017 Date Inactivated: 02/10/2018 Target Resolution Date: 01/31/2018 Goal Status: Met Patient/caregiver will verbalize understanding of skin care regimen Date Initiated: 12/31/2017 Target Resolution Date: 07/17/2019 Goal Status: Active Patient/caregiver will verbalize/demonstrate measures taken to prevent injury and/or falls Date Initiated: 12/31/2017 Date Inactivated: 02/10/2018 Target Resolution Date: 01/31/2018 Goal Status: Met Interventions: Assess Activities of Daily Living upon admission and as needed Assess fall risk on admission and as needed Assess: immobility, friction, shearing, incontinence upon admission and as needed Assess impairment of mobility on admission and as needed per policy Assess personal safety and home safety (as indicated) on admission and as needed Assess self care needs on admission and as needed Notes: Wound/Skin Impairment Nursing Diagnoses: Knowledge deficit related to ulceration/compromised skin integrity Goals: Patient/caregiver will verbalize understanding of skin care regimen Date Initiated: 05/20/2019 Target Resolution Date: 07/17/2019 Goal Status: Active Ulcer/skin breakdown will have a volume reduction of 30% by week 4 Date Initiated: 12/31/2017 Date Inactivated: 05/20/2019 Target Resolution Date: 06/18/2018 Goal Status: Met Interventions: Assess patient/caregiver ability to obtain necessary supplies Assess patient/caregiver ability to perform ulcer/skin care regimen upon admission and as needed Assess ulceration(s) every visit Notes: Electronic Signature(s) Signed: 06/25/2019 5:25:48 PM By: Deon Pilling Entered By: Deon Pilling on 06/25/2019  12:55:46 -------------------------------------------------------------------------------- Pain Assessment [name] Patient Name: Date of Service: PHU, RECORD [name] 06/25/2019 1:15 PM Medical Record Number: [id number] Patient Account Number: [id number] Date of Birth/Sex: Treating RN: 10/16/1945 (74 y.o. [name] Primary Care Airanna Partin: Theodora Blow, MIKE Other Clinician: Referring Krislynn Gronau: Treating Tiron Suski/Extender: Joanne Chars, MIKE Weeks in Treatment: 28 Active Problems Location of Pain Severity and Description of Pain Patient Has Paino No Site Locations Pain Management and Medication Current Pain Management: Electronic Signature(s) Signed: 06/25/2019 5:18:16 PM By: Levan Hurst RN, BSN Entered By: Levan Hurst on 06/25/2019 13:08:42 -------------------------------------------------------------------------------- Patient/Caregiver Education [name] Patient Name: Date of Service: [name] [name] [name] 6/10/2021andnbsp1:15 PM Medical Record Number: [id number] Patient Account Number: [id number] Date of Birth/Gender: Treating RN: 10-15-1945 (74 y.o. [name] Primary Care Physician: Theodora Blow, MIKE Other Clinician: Referring Physician: Treating Physician/Extender: Joanne Chars, MIKE Weeks in Treatment: 73 Education Assessment Education Provided To: Patient Education Topics Provided Wound/Skin Impairment: Handouts: Skin Care Do's and Dont's Methods: Explain/Verbal Responses: Reinforcements needed Electronic Signature(s) Signed: 06/25/2019 5:25:48 PM By: Deon Pilling Entered By: Deon Pilling on 06/25/2019 12:56:02 -------------------------------------------------------------------------------- Wound Assessment [name] Patient Name: Date of Service: [name] [name], [name] [name] [name] 06/25/2019 1:15 PM Medical Record Number: [id number] Patient Account Number: [id number] Date of Birth/Sex: Treating RN: 1945-12-06 (74 y.o. [name] Primary Care  Hibba Schram: Theodora Blow, MIKE Other Clinician: Referring Solangel Mcmanaway: Treating Tempie Gibeault/Extender: Joanne Chars, MIKE Weeks in Treatment: 49 Wound Status Wound Number: 1 Primary Etiology: Atypical Wound Location: Right Hand - Dorsum Wound Status: Open Wounding Event: Gradually Appeared Comorbid History: Anemia, Hypertension, Type II Diabetes, Paraplegia Date Acquired: 11/15/2017 Weeks Of Treatment: 77 Clustered Wound: Yes Wound Measurements  Length: (cm) 14 Width: (cm) 7.5 Depth: (cm) 0.1 Clustered Quantity: 10 Area: (cm) 82.467 Volume: (cm) 8.247 % Reduction in Area: -158.6% % Reduction in Volume: -158.6% Epithelialization: Medium (34-66%) Tunneling: No Undermining: No Wound Description Classification: Full Thickness Without Exposed Support Structures Wound Margin: Flat and Intact Exudate Amount: Medium Exudate Type: Purulent Exudate Color: yellow, brown, green Foul Odor After Cleansing: No Slough/Fibrino No Wound Bed Granulation Amount: Large (67-100%) Exposed Structure Granulation Quality: Red Fascia Exposed: No Necrotic Amount: None Present (0%) Fat Layer (Subcutaneous Tissue) Exposed: Yes Tendon Exposed: No Muscle Exposed: No Joint Exposed: No Bone Exposed: No Treatment Notes Wound #1 (Right Hand - Dorsum) 3. Primary Dressing Applied Calcium Alginate Ag 4. Secondary Dressing ABD Pad Roll Gauze 7. Footwear/Offloading device applied Multipodus Splint/Boot Electronic Signature(s) Signed: 06/25/2019 5:18:16 PM By: Levan Hurst RN, BSN Entered By: Levan Hurst on 06/25/2019 13:12:24 -------------------------------------------------------------------------------- Wound Assessment [name] Patient Name: Date of Service: [name] [name], [name] [name] [name] 06/25/2019 1:15 PM Medical Record Number: [id number] Patient Account Number: [id number] Date of Birth/Sex: Treating RN: Nov 07, 1945 (74 y.o. [name] Primary Care Linda Biehn: Theodora Blow, MIKE Other  Clinician: Referring Kerith Sherley: Treating Jadis Pitter/Extender: Joanne Chars, MIKE Weeks in Treatment: 74 Wound Status Wound Number: 14 Primary Etiology: Atypical Wound Location: Left, Proximal, Anterior Lower Leg Wound Status: Open Wounding Event: Gradually Appeared Comorbid History: Anemia, Hypertension, Type II Diabetes, Paraplegia Date Acquired: 05/20/2019 Weeks Of Treatment: 5 Clustered Wound: No Wound Measurements Length: (cm) 1.2 Width: (cm) 1 Depth: (cm) 0.1 Area: (cm) 0.942 Volume: (cm) 0.094 % Reduction in Area: -1226.8% % Reduction in Volume: -1242.9% Epithelialization: Small (1-33%) Tunneling: No Undermining: No Wound Description Classification: Full Thickness Without Exposed Support Structures Wound Margin: Distinct, outline attached Exudate Amount: Medium Exudate Type: Serosanguineous Exudate Color: red, brown Foul Odor After Cleansing: No Slough/Fibrino No Wound Bed Granulation Amount: Large (67-100%) Exposed Structure Granulation Quality: Red, Pink Fascia Exposed: No Necrotic Amount: None Present (0%) Fat Layer (Subcutaneous Tissue) Exposed: Yes Tendon Exposed: No Muscle Exposed: No Joint Exposed: No Bone Exposed: No Treatment Notes Wound #14 (Left, Proximal, Anterior Lower Leg) 3. Primary Dressing Applied Calcium Alginate Ag 4. Secondary Dressing ABD Pad Roll Gauze 7. Footwear/Offloading device applied Multipodus Splint/Boot Electronic Signature(s) Signed: 06/25/2019 5:18:16 PM By: Levan Hurst RN, BSN Entered By: Levan Hurst on 06/25/2019 13:12:44 -------------------------------------------------------------------------------- Wound Assessment [name] Patient Name: Date of Service: [name] [name], [name] [name] [name] 06/25/2019 1:15 PM Medical Record Number: [id number] Patient Account Number: [id number] Date of Birth/Sex: Treating RN: 1946-01-07 (74 y.o. [name] Primary Care Alvie Speltz: Theodora Blow, MIKE Other Clinician: Referring  Ainara Eldridge: Treating Chenoah Mcnally/Extender: Joanne Chars, MIKE Weeks in Treatment: 77 Wound Status Wound Number: 5 Primary Etiology: Diabetic Wound/Ulcer of the Lower Extremity Wound Location: Right, Anterior Lower Leg Wound Status: Open Wounding Event: Gradually Appeared Comorbid History: Anemia, Hypertension, Type II Diabetes, Paraplegia Date Acquired: 11/15/2017 Weeks Of Treatment: 77 Clustered Wound: Yes Wound Measurements Length: (cm) 0. Width: (cm) 0. Depth: (cm) 0. Clustered Quantity: 1 Area: (cm) 0 Volume: (cm) 0 5 % Reduction in Area: 99.8% 6 % Reduction in Volume: 99.8% 1 Epithelialization: Large (67-100%) Tunneling: No .236 Undermining: No .024 Wound Description Classification: Grade 2 Wound Margin: Flat and Intact Exudate Amount: Medium Exudate Type: Serosanguineous Exudate Color: red, brown Foul Odor After Cleansing: No Slough/Fibrino No Wound Bed Granulation Amount: Large (67-100%) Exposed Structure Granulation Quality: Red, Hyper-granulation Fascia Exposed: No Necrotic Amount: None Present (0%) Fat Layer (Subcutaneous Tissue) Exposed: Yes Tendon Exposed: No Muscle Exposed: No Joint Exposed: No  Bone Exposed: No Treatment Notes Wound #5 (Right, Anterior Lower Leg) 3. Primary Dressing Applied Calcium Alginate Ag 4. Secondary Dressing ABD Pad Roll Gauze 7. Footwear/Offloading device applied Multipodus Splint/Boot Electronic Signature(s) Signed: 06/25/2019 5:18:16 PM By: Levan Hurst RN, BSN Entered By: Levan Hurst on 06/25/2019 13:13:52 -------------------------------------------------------------------------------- Wound Assessment [name] Patient Name: Date of Service: [name] [name], [name] [name] [name] 06/25/2019 1:15 PM Medical Record Number: [id number] Patient Account Number: [id number] Date of Birth/Sex: Treating RN: Jul 03, 1945 (74 y.o. [name] Primary Care Diantha Paxson: Theodora Blow, MIKE Other Clinician: Referring Merryl Buckels: Treating  Erie Radu/Extender: Joanne Chars, MIKE Weeks in Treatment: 77 Wound Status Wound Number: 6 Primary Etiology: Diabetic Wound/Ulcer of the Lower Extremity Wound Location: Right, Lateral, Posterior Lower Leg Wound Status: Open Wounding Event: Gradually Appeared Comorbid History: Anemia, Hypertension, Type II Diabetes, Paraplegia Date Acquired: 11/15/2017 Weeks Of Treatment: 77 Clustered Wound: Yes Wound Measurements Length: (cm) 1. Width: (cm) 1 Depth: (cm) 0. Clustered Quantity: 2 Area: (cm) 1 Volume: (cm) 0 5 % Reduction in Area: 95.6% % Reduction in Volume: 95.6% 1 Epithelialization: Medium (34-66%) Tunneling: No .178 Undermining: No .118 Wound Description Classification: Grade 2 Wound Margin: Flat and Intact Exudate Amount: Small Exudate Type: Serosanguineous Exudate Color: red, brown Wound Bed Granulation Amount: Large (67-100%) Granulation Quality: Red Necrotic Amount: None Present (0%) Foul Odor After Cleansing: No Slough/Fibrino No Exposed Structure Fascia Exposed: No Fat Layer (Subcutaneous Tissue) Exposed: Yes Tendon Exposed: No Muscle Exposed: No Joint Exposed: No Bone Exposed: No Treatment Notes Wound #6 (Right, Lateral, Posterior Lower Leg) 3. Primary Dressing Applied Calcium Alginate Ag 4. Secondary Dressing ABD Pad Roll Gauze 7. Footwear/Offloading device applied Multipodus Splint/Boot Electronic Signature(s) Signed: 06/25/2019 5:18:16 PM By: Levan Hurst RN, BSN Entered By: Levan Hurst on 06/25/2019 13:13:20 -------------------------------------------------------------------------------- Wound Assessment [name] Patient Name: Date of Service: [name] [name], [name] [name] [name] 06/25/2019 1:15 PM Medical Record Number: [id number] Patient Account Number: [id number] Date of Birth/Sex: Treating RN: [birth year] (74 y.o. [name] Primary Care Taggert Bozzi: Theodora Blow, MIKE Other Clinician: Referring Violia Knopf: Treating Makylee Sanborn/Extender: Joanne Chars, MIKE Weeks in Treatment: 77 Wound Status Wound Number: 8 Primary Etiology: Diabetic Wound/Ulcer of the Lower Extremity Wound Location: Left, Anterior Lower Leg Wound Status: Open Wounding Event: Gradually Appeared Comorbid History: Anemia, Hypertension, Type II Diabetes, Paraplegia Date Acquired: 11/15/2017 Weeks Of Treatment: 77 Clustered Wound: Yes Wound Measurements Length: (cm) 0.8 Width: (cm) 0.6 Depth: (cm) 0.1 Clustered Quantity: 2 Area: (cm) 0.377 Volume: (cm) 0.038 % Reduction in Area: 98.2% % Reduction in Volume: 98.1% Epithelialization: Large (67-100%) Tunneling: No Undermining: No Wound Description Classification: Grade 2 Wound Margin: Flat and Intact Exudate Amount: Small Exudate Type: Serosanguineous Exudate Color: red, brown Foul Odor After Cleansing: No Slough/Fibrino No Wound Bed Granulation Amount: Large (67-100%) Exposed Structure Granulation Quality: Red Fascia Exposed: No Necrotic Amount: None Present (0%) Fat Layer (Subcutaneous Tissue) Exposed: Yes Tendon Exposed: No Muscle Exposed: No Joint Exposed: No Bone Exposed: No Treatment Notes Wound #8 (Left, Anterior Lower Leg) 3. Primary Dressing Applied Calcium Alginate Ag 4. Secondary Dressing ABD Pad Roll Gauze 7. Footwear/Offloading device applied Multipodus Splint/Boot Electronic Signature(s) Signed: 06/25/2019 5:18:16 PM By: Levan Hurst RN, BSN Entered By: Levan Hurst on 06/25/2019 13:13:32 -------------------------------------------------------------------------------- [name] [name] Patient Name: Date of Service: [name] [name], CLIFFO [name] 06/25/2019 1:15 PM Medical Record Number: [id number] Patient Account Number: [id number] Date of Birth/Sex: Treating RN: 1945-11-21 (74 y.o. [name] Primary Care Laroy Mustard: Theodora Blow, MIKE Other Clinician: Referring Macedonio Scallon: Treating Cataleya Cristina/Extender: Joanne Chars, MIKE Weeks  in Treatment: 77 Vital  Signs Time Taken: 12:52 Temperature (F): 98.2 Height (in): 67 Pulse (bpm): 111 Weight (lbs): 130 Respiratory Rate (breaths/min): 18 Body Mass Index (BMI): 20.4 Blood Pressure (mmHg): 141/93 Reference Range: 80 - 120 mg / dl Electronic Signature(s) Signed: 06/25/2019 5:18:16 PM By: Levan Hurst RN, BSN Entered By: Levan Hurst on 06/25/2019 13:08:36
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
## 2019-07-10 ENCOUNTER — Encounter (HOSPITAL_BASED_OUTPATIENT_CLINIC_OR_DEPARTMENT_OTHER): Payer: Medicare Other | Admitting: Internal Medicine

## 2019-07-10 DIAGNOSIS — L97213 Non-pressure chronic ulcer of right calf with necrosis of muscle: Secondary | ICD-10-CM | POA: Diagnosis not present

## 2019-07-10 NOTE — Progress Notes (Signed)
ASHON, ROSENBERG ([id number]) Visit Report for 07/10/2019 Arrival Information Details Patient Name: Date of Service: SAI, ZINN RD 07/10/2019 10:45 A M Medical Record Number: [id number] Patient Account Number: [id number] Date of Birth/Sex: Treating RN: [birth year] (74 y.o. [name] Primary Care Peony Barner: Theodora Blow, MIKE Other Clinician: Referring Rendy Lazard: Treating Rheanne Cortopassi/Extender: Joanne Chars, MIKE Weeks in Treatment: 51 Visit Information History Since Last Visit Added or deleted any medications: No Patient Arrived: Stretcher Any new allergies or adverse reactions: No Arrival Time: 11:15 Had a fall or experienced change in No Accompanied By: EMS activities of daily living that may affect Transfer Assistance: None risk of falls: Patient Identification Verified: Yes Signs or symptoms of abuse/neglect since last visito No Secondary Verification Process Completed: Yes Hospitalized since last visit: No Patient Requires Transmission-Based Precautions: No Implantable device outside of the clinic excluding No Patient Has Alerts: Yes cellular tissue based products placed in the center Patient Alerts: R ABI non compressible since last visit: Has Dressing in Place as Prescribed: Yes Pain Present Now: No Electronic Signature(s) Signed: 07/10/2019 5:05:03 PM By: Deon Pilling Entered By: Deon Pilling on 07/10/2019 11:29:37 -------------------------------------------------------------------------------- Clinic Level of Care Assessment Details Patient Name: Date of Service: CORLEONE, BIEGLER RD 07/10/2019 10:45 A M Medical Record Number: [id number] Patient Account Number: [id number] Date of Birth/Sex: Treating RN: 1945/03/12 (74 y.o. [name] Primary Care Rojelio Uhrich: Theodora Blow, MIKE Other Clinician: Referring Wilkin Lippy: Treating Gregg Holster/Extender: Joanne Chars, MIKE Weeks in Treatment: 66 Clinic Level of Care Assessment Items TOOL 4 Quantity Score X- 1  0 Use when only an EandM is performed on FOLLOW-UP visit ASSESSMENTS - Nursing Assessment / Reassessment X- 1 10 Reassessment of Co-morbidities (includes updates in patient status) X- 1 5 Reassessment of Adherence to Treatment Plan ASSESSMENTS - Wound and Skin A ssessment / Reassessment '[]'  - 0 Simple Wound Assessment / Reassessment - one wound X- 7 5 Complex Wound Assessment / Reassessment - multiple wounds '[]'  - 0 Dermatologic / Skin Assessment (not related to wound area) ASSESSMENTS - Focused Assessment X- 2 5 Circumferential Edema Measurements - multi extremities '[]'  - 0 Nutritional Assessment / Counseling / Intervention '[]'  - 0 Lower Extremity Assessment (monofilament, tuning fork, pulses) '[]'  - 0 Peripheral Arterial Disease Assessment (using hand held doppler) ASSESSMENTS - Ostomy and/or Continence Assessment and Care '[]'  - 0 Incontinence Assessment and Management '[]'  - 0 Ostomy Care Assessment and Management (repouching, etc.) PROCESS - Coordination of Care X - Simple Patient / Family Education for ongoing care 1 15 '[]'  - 0 Complex (extensive) Patient / Family Education for ongoing care X- 1 10 Staff obtains Programmer, systems, Records, T Results / Process Orders est '[]'  - 0 Staff telephones HHA, Nursing Homes / Clarify orders / etc '[]'  - 0 Routine Transfer to another Facility (non-emergent condition) '[]'  - 0 Routine Hospital Admission (non-emergent condition) '[]'  - 0 New Admissions / Biomedical engineer / Ordering NPWT Apligraf, etc. , '[]'  - 0 Emergency Hospital Admission (emergent condition) X- 1 10 Simple Discharge Coordination '[]'  - 0 Complex (extensive) Discharge Coordination PROCESS - Special Needs '[]'  - 0 Pediatric / Minor Patient Management '[]'  - 0 Isolation Patient Management '[]'  - 0 Hearing / Language / Visual special needs '[]'  - 0 Assessment of Community assistance (transportation, D/C planning, etc.) '[]'  - 0 Additional assistance / Altered mentation '[]'  -  0 Support Surface(s) Assessment (bed, cushion, seat, etc.) INTERVENTIONS - Wound Cleansing / Measurement '[]'  - 0 Simple Wound Cleansing - one wound X- 7 5 Complex  Wound Cleansing - multiple wounds X- 1 5 Wound Imaging (photographs - any number of wounds) '[]'  - 0 Wound Tracing (instead of photographs) '[]'  - 0 Simple Wound Measurement - one wound X- 7 5 Complex Wound Measurement - multiple wounds INTERVENTIONS - Wound Dressings X - Small Wound Dressing one or multiple wounds 7 10 '[]'  - 0 Medium Wound Dressing one or multiple wounds '[]'  - 0 Large Wound Dressing one or multiple wounds X- 1 5 Application of Medications - topical '[]'  - 0 Application of Medications - injection INTERVENTIONS - Miscellaneous '[]'  - 0 External ear exam '[]'  - 0 Specimen Collection (cultures, biopsies, blood, body fluids, etc.) '[]'  - 0 Specimen(s) / Culture(s) sent or taken to Lab for analysis '[]'  - 0 Patient Transfer (multiple staff / Civil Service fast streamer / Similar devices) '[]'  - 0 Simple Staple / Suture removal (25 or less) '[]'  - 0 Complex Staple / Suture removal (26 or more) '[]'  - 0 Hypo / Hyperglycemic Management (close monitor of Blood Glucose) '[]'  - 0 Ankle / Brachial Index (ABI) - do not check if billed separately X- 1 5 Vital Signs Has the patient been seen at the hospital within the last three years: Yes Total Score: 250 Level Of Care: New/Established - Level 5 Electronic Signature(s) Signed: 07/10/2019 5:01:22 PM By: Kela Millin Entered By: Kela Millin on 07/10/2019 12:18:23 -------------------------------------------------------------------------------- Encounter Discharge Information Details Patient Name: Date of Service: Regino Schultze RD 07/10/2019 10:45 A M Medical Record Number: [id number] Patient Account Number: [id number] Date of Birth/Sex: Treating RN: 1945/12/27 (74 y.o. [name] Primary Care Damarius Karnes: Theodora Blow, MIKE Other Clinician: Referring Tonio Seider: Treating  Melaya Hoselton/Extender: Joanne Chars, MIKE Weeks in Treatment: 72 Encounter Discharge Information Items Discharge Condition: Stable Ambulatory Status: Stretcher Discharge Destination: Home Transportation: Ambulance Accompanied By: EMS Schedule Follow-up Appointment: Yes Clinical Summary of Care: Patient Declined Electronic Signature(s) Signed: 07/10/2019 4:55:10 PM By: Baruch Gouty RN, BSN Entered By: Baruch Gouty on 07/10/2019 14:46:41 -------------------------------------------------------------------------------- Lower Extremity Assessment Details Patient Name: Date of Service: ZAKKARY, THIBAULT RD 07/10/2019 10:45 A M Medical Record Number: [id number] Patient Account Number: [id number] Date of Birth/Sex: Treating RN: 02/27/1945 (74 y.o. [name] Primary Care Mina Babula: Theodora Blow, MIKE Other Clinician: Referring Cristel Rail: Treating Jon Lall/Extender: Joanne Chars, MIKE Weeks in Treatment: 46 Edema Assessment Assessed: [Left: Yes] [Right: Yes] Edema: [Left: Yes] [Right: Yes] Calf Left: Right: Point of Measurement: cm From Medial Instep 39 cm 37 cm Ankle Left: Right: Point of Measurement: cm From Medial Instep 17 cm 17 cm Electronic Signature(s) Signed: 07/10/2019 5:05:03 PM By: Deon Pilling Entered By: Deon Pilling on 07/10/2019 11:30:21 -------------------------------------------------------------------------------- Multi Wound Chart Details Patient Name: Date of Service: Regino Schultze RD 07/10/2019 10:45 A M Medical Record Number: [id number] Patient Account Number: [id number] Date of Birth/Sex: Treating RN: December 02, 1945 (74 y.o. [name] Primary Care Amran Malter: Theodora Blow, MIKE Other Clinician: Referring Valoria Tamburri: Treating Kaya Klausing/Extender: Joanne Chars, MIKE Weeks in Treatment: 4 Vital Signs Height(in): 67 Pulse(bpm): 80 Weight(lbs): 130 Blood Pressure(mmHg): 145/90 Body Mass Index(BMI): 20 Temperature(F):  98.2 Respiratory Rate(breaths/min): 16 Photos: [1:No Photos Right Hand - Dorsum] [14:No Photos Left, Proximal, Anterior Lower Leg] [15:No Photos Left, Lateral Lower Leg] Wound Location: [1:Gradually Appeared] [14:Gradually Appeared] [15:Gradually Appeared] Wounding Event: [1:Atypical] [14:Atypical] [15:Atypical] Primary Etiology: [1:Anemia, Hypertension, Type II] [14:Anemia, Hypertension, Type II] [15:Anemia, Hypertension, Type II] Comorbid History: [1:Diabetes, Paraplegia 11/15/2017] [14:Diabetes, Paraplegia 05/20/2019] [15:Diabetes, Paraplegia 07/10/2019] Date Acquired: [1:79] [14:7] [15:0] Weeks of Treatment: [1:Open] [14:Open] [15:Open] Wound Status: [1:Yes] [  14:Yes] [15:Yes] Clustered Wound: [1:4] [14:2] [15:2] Clustered Quantity: [1:11x6.5x0.1] [14:3x1x0.1] [15:2x4x0.1] Measurements L x W x D (cm) [1:56.156] [14:2.356] [15:6.283] A (cm) : rea [1:5.616] [14:0.236] [15:0.628] Volume (cm) : [1:-76.10%] [14:-3218.30%] [15:N/A] % Reduction in Area: [1:-76.10%] [14:-3271.40%] [15:N/A] % Reduction in Volume: [1:Full Thickness Without Exposed] [14:Full Thickness Without Exposed] [15:Full Thickness Without Exposed] Classification: [1:Support Structures Medium] [14:Support Structures Medium] [15:Support Structures Medium] Exudate Amount: [1:Purulent] [14:Serosanguineous] [15:Serosanguineous] Exudate Type: [1:yellow, brown, green] [14:red, brown] [15:red, brown] Exudate Color: [1:Flat and Intact] [14:Distinct, outline attached] [15:Distinct, outline attached] Wound Margin: [1:Large (67-100%)] [14:Large (67-100%)] [15:Large (67-100%)] Granulation Amount: [1:Red] [14:Red, Pink] [15:Red, Friable] Granulation Quality: [1:Small (1-33%)] [14:None Present (0%)] [15:None Present (0%)] Necrotic Amount: [1:Fat Layer (Subcutaneous Tissue)] [14:Fat Layer (Subcutaneous Tissue)] [15:Fat Layer (Subcutaneous Tissue)] Exposed Structures: [1:Exposed: Yes Fascia: No Tendon: No Muscle: No Joint: No Bone: No Large  (67-100%)] [14:Exposed: Yes Fascia: No Tendon: No Muscle: No Joint: No Bone: No Medium (34-66%)] [15:Exposed: Yes Fascia: No Tendon: No Muscle: No Joint: No Bone: No Small (1-33%)] Wound Number: '[id number] 6 ' Photos: No Photos No Photos No Photos Right, Proximal, Anterior Lower Leg Right, Anterior Lower Leg Right, Lateral, Posterior Lower Leg Wound Location: Gradually Appeared Gradually Appeared Gradually Appeared Wounding Event: 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 Primary Etiology: Extremity Extremity Anemia, Hypertension, Type II Anemia, Hypertension, Type II Anemia, Hypertension, Type II Comorbid History: Diabetes, Paraplegia Diabetes, Paraplegia Diabetes, Paraplegia 07/10/2019 11/15/2017 11/15/2017 Date Acquired: 0 79 79 Weeks of Treatment: Open Open Open Wound Status: No Yes Yes Clustered Wound: N/A 2 1 Clustered Quantity: 1x0.7x0.1 3x0.3x0.1 1.4x0.6x0.1 Measurements L x W x D (cm) 0.55 0.707 0.66 A (cm) : rea 0.055 0.071 0.066 Volume (cm) : N/A 99.40% 97.60% % Reduction in Area: N/A 99.40% 97.60% % Reduction in Volume: Full Thickness Without Exposed Grade 2 Grade 2 Classification: Support Structures Medium Medium Small Exudate Amount: Serosanguineous Serosanguineous Serosanguineous Exudate Type: red, brown red, brown red, brown Exudate Color: Distinct, outline attached Flat and Intact Flat and Intact Wound Margin: Large (67-100%) Large (67-100%) Large (67-100%) Granulation Amount: Red, Pink, Friable Red, Hyper-granulation Red, Friable Granulation Quality: None Present (0%) None Present (0%) None Present (0%) Necrotic Amount: Fat Layer (Subcutaneous Tissue) Fat Layer (Subcutaneous Tissue) Fat Layer (Subcutaneous Tissue) Exposed Structures: Exposed: Yes Exposed: Yes Exposed: Yes Fascia: No Fascia: No Fascia: No Tendon: No Tendon: No Tendon: No Muscle: No Muscle: No Muscle: No Joint: No Joint: No Joint: No Bone:  No Bone: No Bone: No Small (1-33%) Medium (34-66%) Large (67-100%) Epithelialization: Wound Number: 8 N/A N/A Photos: No Photos N/A N/A Left, Anterior Lower Leg N/A N/A Wound Location: Gradually Appeared N/A N/A Wounding Event: Diabetic Wound/Ulcer of the Lower N/A N/A Primary Etiology: Extremity Anemia, Hypertension, Type II N/A N/A Comorbid History: Diabetes, Paraplegia 11/15/2017 N/A N/A Date Acquired: 37 N/A N/A Weeks of Treatment: Open N/A N/A Wound Status: Yes N/A N/A Clustered Wound: N/A N/A N/A Clustered Quantity: 0.5x0.5x0.1 N/A N/A Measurements L x W x D (cm) 0.196 N/A N/A A (cm) : rea 0.02 N/A N/A Volume (cm) : 99.00% N/A N/A % Reduction in A rea: 99.00% N/A N/A % Reduction in Volume: Grade 2 N/A N/A Classification: Small N/A N/A Exudate A mount: Serosanguineous N/A N/A Exudate Type: red, brown N/A N/A Exudate Color: Flat and Intact N/A N/A Wound Margin: Large (67-100%) N/A N/A Granulation A mount: Red, Friable N/A N/A Granulation Quality: None Present (0%) N/A N/A Necrotic A mount: Fat Layer (Subcutaneous Tissue) N/A  N/A Exposed Structures: Exposed: Yes Fascia: No Tendon: No Muscle: No Joint: No Bone: No Large (67-100%) N/A N/A Epithelialization: Treatment Notes Electronic Signature(s) Signed: 07/10/2019 5:01:22 PM By: Kela Millin Signed: 07/10/2019 5:27:17 PM By: Linton Ham MD Entered By: Linton Ham on 07/10/2019 12:53:39 -------------------------------------------------------------------------------- [place] Details Patient Name: Date of Service: Regino Schultze RD 07/10/2019 10:45 A M Medical Record Number: [id number] Patient Account Number: [id number] Date of Birth/Sex: Treating RN: February 19, 1945 (74 y.o. [name] Primary Care Terrez Ander: Theodora Blow, MIKE Other Clinician: Referring Erminio Nygard: Treating Hilari Wethington/Extender: Joanne Chars, MIKE Weeks in Treatment: 36 Active  Inactive Abuse / Safety / Falls / Self Care Management Nursing Diagnoses: Impaired physical mobility Potential for injury related to transfers Goals: Patient will not develop complications from immobility Date Initiated: 12/31/2017 Date Inactivated: 02/10/2018 Target Resolution Date: 01/31/2018 Goal Status: Met Patient/caregiver will verbalize understanding of skin care regimen Date Initiated: 12/31/2017 Target Resolution Date: 07/17/2019 Goal Status: Active Patient/caregiver will verbalize/demonstrate measures taken to prevent injury and/or falls Date Initiated: 12/31/2017 Date Inactivated: 02/10/2018 Target Resolution Date: 01/31/2018 Goal Status: Met Interventions: Assess Activities of Daily Living upon admission and as needed Assess fall risk on admission and as needed Assess: immobility, friction, shearing, incontinence upon admission and as needed Assess impairment of mobility on admission and as needed per policy Assess personal safety and home safety (as indicated) on admission and as needed Assess self care needs on admission and as needed Notes: Wound/Skin Impairment Nursing Diagnoses: Knowledge deficit related to ulceration/compromised skin integrity Goals: Patient/caregiver will verbalize understanding of skin care regimen Date Initiated: 05/20/2019 Target Resolution Date: 07/17/2019 Goal Status: Active Ulcer/skin breakdown will have a volume reduction of 30% by week 4 Date Initiated: 12/31/2017 Date Inactivated: 05/20/2019 Target Resolution Date: 06/18/2018 Goal Status: Met Interventions: Assess patient/caregiver ability to obtain necessary supplies Assess patient/caregiver ability to perform ulcer/skin care regimen upon admission and as needed Assess ulceration(s) every visit Notes: Electronic Signature(s) Signed: 07/10/2019 5:01:22 PM By: Kela Millin Entered By: Kela Millin on 07/10/2019  12:17:07 -------------------------------------------------------------------------------- Pain Assessment Details Patient Name: Date of Service: ELDRICK, PENICK RD 07/10/2019 10:45 A M Medical Record Number: [id number] Patient Account Number: [id number] Date of Birth/Sex: Treating RN: 02/05/1945 (74 y.o. [name] Primary Care Christyann Manolis: Theodora Blow, MIKE Other Clinician: Referring Bibi Economos: Treating Tyshae Stair/Extender: Joanne Chars, MIKE Weeks in Treatment: 71 Active Problems Location of Pain Severity and Description of Pain Patient Has Paino No Site Locations Rate the pain. Rate the pain. Current Pain Level: 0 Pain Management and Medication Current Pain Management: Medication: No Cold Application: No Rest: No Massage: No Activity: No T.E.N.S.: No Heat Application: No Leg drop or elevation: No Is the Current Pain Management Adequate: Adequate How does your wound impact your activities of daily livingo Sleep: No Bathing: No Appetite: No Relationship With Others: No Bladder Continence: No Emotions: No Bowel Continence: No Work: No Toileting: No Drive: No Dressing: No Hobbies: No Electronic Signature(s) Signed: 07/10/2019 5:05:03 PM By: Deon Pilling Entered By: Deon Pilling on 07/10/2019 11:30:04 -------------------------------------------------------------------------------- Patient/Caregiver Education Details Patient Name: Date of Service: Regino Schultze RD 6/25/2021andnbsp10:45 A M Medical Record Number: [id number] Patient Account Number: [id number] Date of Birth/Gender: Treating RN: 14-Mar-1945 (74 y.o. [name] Primary Care Physician: Theodora Blow, MIKE Other Clinician: Referring Physician: Treating Physician/Extender: Joanne Chars, MIKE Weeks in Treatment: 36 Education Assessment Education Provided To: Patient Education Topics Provided Wound/Skin Impairment: Handouts: Caring for Your Ulcer Methods:  Explain/Verbal Responses: State content correctly Electronic Signature(s)  Signed: 07/10/2019 5:01:22 PM By: Kela Millin Entered By: Kela Millin on 07/10/2019 12:17:22 -------------------------------------------------------------------------------- Wound Assessment Details Patient Name: Date of Service: DARSHAWN, BOATENG RD 07/10/2019 10:45 A M Medical Record Number: [id number] Patient Account Number: [id number] Date of Birth/Sex: Treating RN: 1945/07/17 (74 y.o. [name], [name] Primary Care Elham Fini: Theodora Blow, MIKE Other Clinician: Referring Shamaine Mulkern: Treating Audriana Aldama/Extender: Joanne Chars, MIKE Weeks in Treatment: 44 Wound Status Wound Number: 1 Primary Etiology: Atypical Wound Location: Right Hand - Dorsum Wound Status: Open Wounding Event: Gradually Appeared Comorbid History: Anemia, Hypertension, Type II Diabetes, Paraplegia Date Acquired: 11/15/2017 Weeks Of Treatment: 79 Clustered Wound: Yes Wound Measurements Length: (cm) 11 Width: (cm) 6.5 Depth: (cm) 0.1 Clustered Quantity: 4 Area: (cm) 56.156 Volume: (cm) 5.616 % Reduction in Area: -76.1% % Reduction in Volume: -76.1% Epithelialization: Large (67-100%) Tunneling: No Undermining: No Wound Description Classification: Full Thickness Without Exposed Support Str Wound Margin: Flat and Intact Exudate Amount: Medium Exudate Type: Purulent Exudate Color: yellow, brown, green uctures Foul Odor After Cleansing: No Slough/Fibrino Yes Wound Bed Granulation Amount: Large (67-100%) Exposed Structure Granulation Quality: Red Fascia Exposed: No Necrotic Amount: Small (1-33%) Fat Layer (Subcutaneous Tissue) Exposed: Yes Necrotic Quality: Adherent Slough Tendon Exposed: No Muscle Exposed: No Joint Exposed: No Bone Exposed: No Treatment Notes Wound #1 (Right Hand - Dorsum) 2. Periwound Care Moisturizing lotion 3. Primary Dressing Applied Calcium Alginate Ag 4. Secondary Dressing Dry  Gauze Roll Gauze 5. Secured With Tape Other (specify in notes) Notes netting Electronic Signature(s) Signed: 07/10/2019 5:05:03 PM By: Deon Pilling Entered By: Deon Pilling on 07/10/2019 11:30:52 -------------------------------------------------------------------------------- Wound Assessment Details Patient Name: Date of Service: CEJAY, CAMBRE RD 07/10/2019 10:45 A M Medical Record Number: [id number] Patient Account Number: [id number] Date of Birth/Sex: Treating RN: 1945-02-22 (74 y.o. [name], [name] Primary Care Kyna Blahnik: Theodora Blow, MIKE Other Clinician: Referring Karinda Cabriales: Treating Jaxxson Cavanah/Extender: Joanne Chars, MIKE Weeks in Treatment: 90 Wound Status Wound Number: 14 Primary Etiology: Atypical Wound Location: Left, Proximal, Anterior Lower Leg Wound Status: Open Wounding Event: Gradually Appeared Comorbid History: Anemia, Hypertension, Type II Diabetes, Paraplegia Date Acquired: 05/20/2019 Weeks Of Treatment: 7 Clustered Wound: Yes Wound Measurements Length: (cm) Width: (cm) Depth: (cm) Clustered Quantity: Area: (cm) Volume: (cm) 3 % Reduction in Area: -3218.3% 1 % Reduction in Volume: -3271.4% 0.1 Epithelialization: Medium (34-66%) 2 Tunneling: No 2.356 Undermining: No 0.236 Wound Description Classification: Full Thickness Without Exposed Support Structures Wound Margin: Distinct, outline attached Exudate Amount: Medium Exudate Type: Serosanguineous Exudate Color: red, brown Foul Odor After Cleansing: No Slough/Fibrino No Wound Bed Granulation Amount: Large (67-100%) Exposed Structure Granulation Quality: Red, Pink Fascia Exposed: No Necrotic Amount: None Present (0%) Fat Layer (Subcutaneous Tissue) Exposed: Yes Tendon Exposed: No Muscle Exposed: No Joint Exposed: No Bone Exposed: No Treatment Notes Wound #14 (Left, Proximal, Anterior Lower Leg) 2. Periwound Care Moisturizing lotion 3. Primary Dressing Applied Calcium Alginate  Ag 4. Secondary Dressing Dry Gauze Roll Gauze 5. Secured With Tape Other (specify in notes) Notes netting Electronic Signature(s) Signed: 07/10/2019 5:05:03 PM By: Deon Pilling Entered By: Deon Pilling on 07/10/2019 11:32:17 -------------------------------------------------------------------------------- Wound Assessment Details Patient Name: Date of Service: NUCHEM, GRATTAN RD 07/10/2019 10:45 A M Medical Record Number: [id number] Patient Account Number: [id number] Date of Birth/Sex: Treating RN: [birth year] (74 y.o. [name] Primary Care Farhad Burleson: Theodora Blow, MIKE Other Clinician: Referring Makhiya Coburn: Treating Madie Cahn/Extender: Joanne Chars, MIKE Weeks in Treatment: 40 Wound Status Wound Number: 15 Primary Etiology: Atypical Wound Location: Left, Lateral Lower Leg  Wound Status: Open Wounding Event: Gradually Appeared Comorbid History: Anemia, Hypertension, Type II Diabetes, Paraplegia Date Acquired: 07/10/2019 Weeks Of Treatment: 0 Clustered Wound: Yes Wound Measurements Length: (cm) 2 Width: (cm) 4 Depth: (cm) 0.1 Clustered Quantity: 2 Area: (cm) 6.283 Volume: (cm) 0.628 % Reduction in Area: % Reduction in Volume: Epithelialization: Small (1-33%) Tunneling: No Undermining: No Wound Description Classification: Full Thickness Without Exposed Support Structures Wound Margin: Distinct, outline attached Exudate Amount: Medium Exudate Type: Serosanguineous Exudate Color: red, brown Foul Odor After Cleansing: No Slough/Fibrino No Wound Bed Granulation Amount: Large (67-100%) Exposed Structure Granulation Quality: Red, Friable Fascia Exposed: No Necrotic Amount: None Present (0%) Fat Layer (Subcutaneous Tissue) Exposed: Yes Tendon Exposed: No Muscle Exposed: No Joint Exposed: No Bone Exposed: No Treatment Notes Wound #15 (Left, Lateral Lower Leg) 2. Periwound Care Moisturizing lotion 3. Primary Dressing Applied Calcium Alginate Ag 4.  Secondary Dressing Dry Gauze Roll Gauze 5. Secured With Tape Other (specify in notes) Notes netting Electronic Signature(s) Signed: 07/10/2019 5:05:03 PM By: Deon Pilling Entered By: Deon Pilling on 07/10/2019 11:35:36 -------------------------------------------------------------------------------- Wound Assessment Details Patient Name: Date of Service: AMADEO, COKE RD 07/10/2019 10:45 A M Medical Record Number: [id number] Patient Account Number: [id number] Date of Birth/Sex: Treating RN: Jul 31, 1945 (74 y.o. [name], [name] Primary Care Isaack Preble: Theodora Blow, MIKE Other Clinician: Referring Rhilyn Battle: Treating Kwali Wrinkle/Extender: Joanne Chars, MIKE Weeks in Treatment: 28 Wound Status Wound Number: 16 Primary Etiology: Atypical Wound Location: Right, Proximal, Anterior Lower Leg Wound Status: Open Wounding Event: Gradually Appeared Comorbid History: Anemia, Hypertension, Type II Diabetes, Paraplegia Date Acquired: 07/10/2019 Weeks Of Treatment: 0 Clustered Wound: No Wound Measurements Length: (cm) 1 Width: (cm) 0.7 Depth: (cm) 0.1 Area: (cm) 0.55 Volume: (cm) 0.055 % Reduction in Area: % Reduction in Volume: Epithelialization: Small (1-33%) Tunneling: No Undermining: No Wound Description Classification: Full Thickness Without Exposed Support Structures Wound Margin: Distinct, outline attached Exudate Amount: Medium Exudate Type: Serosanguineous Exudate Color: red, brown Foul Odor After Cleansing: No Slough/Fibrino No Wound Bed Granulation Amount: Large (67-100%) Exposed Structure Granulation Quality: Red, Pink, Friable Fascia Exposed: No Necrotic Amount: None Present (0%) Fat Layer (Subcutaneous Tissue) Exposed: Yes Tendon Exposed: No Muscle Exposed: No Joint Exposed: No Bone Exposed: No Treatment Notes Wound #16 (Right, Proximal, Anterior Lower Leg) 2. Periwound Care Moisturizing lotion 3. Primary Dressing Applied Calcium Alginate Ag 4.  Secondary Dressing Dry Gauze Roll Gauze 5. Secured With Tape Other (specify in notes) Notes netting Electronic Signature(s) Signed: 07/10/2019 5:05:03 PM By: Deon Pilling Entered By: Deon Pilling on 07/10/2019 11:36:18 -------------------------------------------------------------------------------- Wound Assessment Details Patient Name: Date of Service: LEWELLYN, FULTZ RD 07/10/2019 10:45 A M Medical Record Number: [id number] Patient Account Number: [id number] Date of Birth/Sex: Treating RN: [birth year] (74 y.o. [name], [name] Primary Care Mariah Harn: Theodora Blow, MIKE Other Clinician: Referring Isobella Ascher: Treating Derrell Milanes/Extender: Joanne Chars, MIKE Weeks in Treatment: 79 Wound Status Wound Number: 5 Primary Etiology: Diabetic Wound/Ulcer of the Lower Extremity Wound Location: Right, Anterior Lower Leg Wound Status: Open Wounding Event: Gradually Appeared Comorbid History: Anemia, Hypertension, Type II Diabetes, Paraplegia Date Acquired: 11/15/2017 Weeks Of Treatment: 79 Clustered Wound: Yes Wound Measurements Length: (cm) 3 Width: (cm) 0.3 Depth: (cm) 0.1 Clustered Quantity: 2 Area: (cm) 0.707 Volume: (cm) 0.071 % Reduction in Area: 99.4% % Reduction in Volume: 99.4% Epithelialization: Medium (34-66%) Tunneling: No Undermining: No Wound Description Classification: Grade 2 Wound Margin: Flat and Intact Exudate Amount: Medium Exudate Type: Serosanguineous Exudate Color: red, brown Foul Odor After Cleansing: No Slough/Fibrino No  Wound Bed Granulation Amount: Large (67-100%) Exposed Structure Granulation Quality: Red, Hyper-granulation Fascia Exposed: No Necrotic Amount: None Present (0%) Fat Layer (Subcutaneous Tissue) Exposed: Yes Tendon Exposed: No Muscle Exposed: No Joint Exposed: No Bone Exposed: No Treatment Notes Wound #5 (Right, Anterior Lower Leg) 2. Periwound Care Moisturizing lotion 3. Primary Dressing Applied Calcium Alginate Ag 4.  Secondary Dressing Dry Gauze Roll Gauze 5. Secured With Tape Other (specify in notes) Notes netting Electronic Signature(s) Signed: 07/10/2019 5:05:03 PM By: Deon Pilling Entered By: Deon Pilling on 07/10/2019 11:32:52 -------------------------------------------------------------------------------- Wound Assessment Details Patient Name: Date of Service: TOME, WILSON RD 07/10/2019 10:45 A M Medical Record Number: [id number] Patient Account Number: [id number] Date of Birth/Sex: Treating RN: 01-27-1945 (74 y.o. [name], [name] Primary Care Challis Crill: Theodora Blow, MIKE Other Clinician: Referring Maxon Kresse: Treating Itzamar Traynor/Extender: Joanne Chars, MIKE Weeks in Treatment: 79 Wound Status Wound Number: 6 Primary Etiology: Diabetic Wound/Ulcer of the Lower Extremity Wound Location: Right, Lateral, Posterior Lower Leg Wound Status: Open Wounding Event: Gradually Appeared Comorbid History: Anemia, Hypertension, Type II Diabetes, Paraplegia Date Acquired: 11/15/2017 Weeks Of Treatment: 79 Clustered Wound: Yes Wound Measurements Length: (cm) 1.4 Width: (cm) 0.6 Depth: (cm) 0.1 Clustered Quantity: 1 Area: (cm) 0.66 Volume: (cm) 0.066 % Reduction in Area: 97.6% % Reduction in Volume: 97.6% Epithelialization: Large (67-100%) Tunneling: No Undermining: No Wound Description Classification: Grade 2 Wound Margin: Flat and Intact Exudate Amount: Small Exudate Type: Serosanguineous Exudate Color: red, brown Foul Odor After Cleansing: No Slough/Fibrino No Wound Bed Granulation Amount: Large (67-100%) Exposed Structure Granulation Quality: Red, Friable Fascia Exposed: No Necrotic Amount: None Present (0%) Fat Layer (Subcutaneous Tissue) Exposed: Yes Tendon Exposed: No Muscle Exposed: No Joint Exposed: No Bone Exposed: No Treatment Notes Wound #6 (Right, Lateral, Posterior Lower Leg) 2. Periwound Care Moisturizing lotion 3. Primary Dressing Applied Calcium  Alginate Ag 4. Secondary Dressing Dry Gauze Roll Gauze 5. Secured With Tape Other (specify in notes) Notes netting Electronic Signature(s) Signed: 07/10/2019 5:05:03 PM By: Deon Pilling Entered By: Deon Pilling on 07/10/2019 11:34:09 -------------------------------------------------------------------------------- Wound Assessment Details Patient Name: Date of Service: RAMY, GRETH RD 07/10/2019 10:45 A M Medical Record Number: [id number] Patient Account Number: [id number] Date of Birth/Sex: Treating RN: 1945/02/10 (74 y.o. [name], [name] Primary Care Dona Klemann: Theodora Blow, MIKE Other Clinician: Referring Aleksei Goodlin: Treating Akaylah Lalley/Extender: Joanne Chars, MIKE Weeks in Treatment: 79 Wound Status Wound Number: 8 Primary Etiology: Diabetic Wound/Ulcer of the Lower Extremity Wound Location: Left, Anterior Lower Leg Wound Status: Open Wounding Event: Gradually Appeared Comorbid History: Anemia, Hypertension, Type II Diabetes, Paraplegia Date Acquired: 11/15/2017 Weeks Of Treatment: 79 Clustered Wound: Yes Wound Measurements Length: (cm) 0.5 Width: (cm) 0.5 Depth: (cm) 0.1 Area: (cm) 0.196 Volume: (cm) 0.02 % Reduction in Area: 99% % Reduction in Volume: 99% Epithelialization: Large (67-100%) Tunneling: No Undermining: No Wound Description Classification: Grade 2 Wound Margin: Flat and Intact Exudate Amount: Small Exudate Type: Serosanguineous Exudate Color: red, brown Foul Odor After Cleansing: No Slough/Fibrino No Wound Bed Granulation Amount: Large (67-100%) Exposed Structure Granulation Quality: Red, Friable Fascia Exposed: No Necrotic Amount: None Present (0%) Fat Layer (Subcutaneous Tissue) Exposed: Yes Tendon Exposed: No Muscle Exposed: No Joint Exposed: No Bone Exposed: No Treatment Notes Wound #8 (Left, Anterior Lower Leg) 2. Periwound Care Moisturizing lotion 3. Primary Dressing Applied Calcium Alginate Ag 4. Secondary Dressing Dry  Gauze Roll Gauze 5. Secured With Tape Other (specify in notes) Notes netting Electronic Signature(s) Signed: 07/10/2019 5:05:03 PM By: Deon Pilling Entered By: Deon Pilling  on 07/10/2019 11:31:44 -------------------------------------------------------------------------------- Vitals Details Patient Name: Date of Service: CHUKWUDI, EWEN RD 07/10/2019 10:45 A M Medical Record Number: [id number] Patient Account Number: [id number] Date of Birth/Sex: Treating RN: 1945/07/16 (74 y.o. [name], [name] Primary Care Joshuan Bolander: Theodora Blow, MIKE Other Clinician: Referring Naman Spychalski: Treating Ahmaya Ostermiller/Extender: Joanne Chars, MIKE Weeks in Treatment: 34 Vital Signs Time Taken: 11:16 Temperature (F): 98.2 Height (in): 67 Pulse (bpm): 80 Weight (lbs): 130 Respiratory Rate (breaths/min): 16 Body Mass Index (BMI): 20.4 Blood Pressure (mmHg): 145/90 Reference Range: 80 - 120 mg / dl Electronic Signature(s) Signed: 07/10/2019 5:05:03 PM By: Deon Pilling Entered By: Deon Pilling on 07/10/2019 11:29:55

## 2019-07-10 NOTE — Progress Notes (Signed)
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 wound base. There is large (67-100%) red, pink granulation within the wound bed. There is no necrotic tissue within the wound bed. Wound #15 status is Open. Original cause of wound was Gradually Appeared. The wound is located on the Left,Lateral Lower Leg. The wound measures 2cm length x 4cm width x 0.1cm depth; 6.283cm^2 area and 0.628cm^3 volume. There is Fat Layer (Subcutaneous Tissue) Exposed exposed. There is no tunneling or undermining noted. There is a medium amount of serosanguineous drainage noted. The wound margin is distinct with the outline attached to the wound base. There is large (67-100%) red, friable granulation within the wound bed. There is no necrotic tissue within the wound bed. Wound #16 status is Open. Original cause of wound was Gradually Appeared. The wound is located on the Right,Proximal,Anterior Lower Leg. The wound measures 1cm length x 0.7cm width x 0.1cm depth; 0.55cm^2 area and 0.055cm^3 volume. There is Fat Layer (Subcutaneous Tissue) Exposed exposed. There is no tunneling or undermining  noted. There is a medium amount of serosanguineous drainage noted. The wound margin is distinct with the outline attached to  the wound base. There is large (67-100%) red, pink, friable granulation within the wound bed. There is no necrotic tissue within the wound bed. Wound #5 status is Open. Original cause of wound was Gradually Appeared. The wound is located on the Right,Anterior Lower Leg. The wound measures 3cm length x 0.3cm width x 0.1cm depth; 0.707cm^2 area and 0.071cm^3 volume. There is Fat Layer (Subcutaneous Tissue) Exposed exposed. There is no tunneling or undermining noted. There is a medium amount of serosanguineous drainage noted. The wound margin is flat and intact. There is large (67-100%) red, hyper - granulation within the wound bed. There is no necrotic tissue within the wound bed. Wound #6 status is Open. Original cause of wound was Gradually Appeared. The wound is located on the Right,Lateral,Posterior Lower Leg. The wound measures 1.4cm length x 0.6cm width x 0.1cm depth; 0.66cm^2 area and 0.066cm^3 volume. There is Fat Layer (Subcutaneous Tissue) Exposed exposed. There is no tunneling or undermining noted. There is a small amount of serosanguineous drainage noted. The wound margin is flat and intact. There is large (67- 100%) red, friable granulation within the wound bed. There is no necrotic tissue within the wound bed. Wound #8 status is Open. Original cause of wound was Gradually Appeared. The wound is located on the Left,Anterior Lower Leg. The wound measures 0.5cm length x 0.5cm width x 0.1cm depth; 0.196cm^2 area and 0.02cm^3 volume. There is Fat Layer (Subcutaneous Tissue) Exposed exposed. There is no tunneling or undermining noted. There is a small amount of serosanguineous drainage noted. The wound margin is flat and intact. There is large (67-100%) red, friable granulation within the wound bed. There is no necrotic tissue within the wound bed. 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 Dressing Change Frequency: Change Dressing every other day. - daily as needed to drainage Skin Barriers/Peri-Wound Care: Moisturizing lotion - to both legs and feet with dressing changes Wound Cleansing: Clean wound with Wound Cleanser - all wounds Primary Wound Dressing: Wound #1 Right Hand - Dorsum: Calcium Alginate with Silver - entire right hand as well. Wound #14 Left,Proximal,Anterior Lower Leg: Calcium Alginate with Silver - entire right hand as well. Wound #5 Right,Anterior Lower Leg: Calcium Alginate with Silver - entire right hand as well. Wound #6 Right,Lateral,Posterior Lower Leg: Calcium Alginate with Silver - entire right hand as well. Wound #8 Left,Anterior Lower Leg: Calcium Alginate with Silver - entire right hand as well. Secondary Dressing: Kerlix/Rolled Gauze Dry Gauze - all wounds Edema Control: Elevate legs to the level of the heart or above for 30 minutes daily and/or when sitting, a frequency of: - throughout the day 1. Silver alginate to all wound areas including the plantar aspect of his hand and gauze 2. He has small open areas on the right anterior and left anterior lower extremity. 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 The patient has a primary cutaneous disorder the exact nature of which has never been clear to me in spite of a trip to [facility] dermatology a year or 2 ago these look like blistering skin this disorders of some type 4. I think the patient  is far too disabled to consider sending to [facility] unless things  really deteriorate. Electronic Signature(s) Signed: 07/10/2019 5:27:17 PM By: Linton Ham MD Entered By: Linton Ham on 07/10/2019 12:57:22 -------------------------------------------------------------------------------- SuperBill Details Patient Name: Date of Service: [name] [name] 07/10/2019 Medical Record Number: [id number] Patient Account Number: [id number] Date of Birth/Sex: Treating RN: May 22, 1945 (74 y.o. [name] [name] Primary Care Provider: Theodora Blow, MIKE Other Clinician: Referring Provider: Treating Provider/Extender: Joanne Chars, MIKE Weeks in Treatment: 56 Diagnosis Coding ICD-10 Codes Code Description [phone] Non-pressure chronic ulcer of right calf with necrosis of muscle L97.811 Non-pressure chronic ulcer of other part of right lower leg limited to breakdown of skin S61.401D Unspecified open wound of right hand, subsequent encounter L89.322 Pressure ulcer of left buttock, stage 2 L97.821 Non-pressure chronic ulcer of other part of left lower leg limited to breakdown of skin Facility Procedures CPT4 Code: [id number] Description: 25366 - WOUND CARE VISIT-LEV 5 EST PT Modifier: Quantity: 1 Physician Procedures : CPT4 Code Description Modifier [id number] - WC PHYS LEVEL 3 - EST PT ICD-10 Diagnosis Description L97.811 Non-pressure chronic ulcer of other part of right lower leg limited to breakdown of skin S61.401D Unspecified open wound of right hand,  subsequent encounter Quantity: 1 Electronic Signature(s) Signed: 07/10/2019 5:27:17 PM By: Linton Ham MD Entered By: Linton Ham on 07/10/2019 12:57:52

## 2019-07-15 ENCOUNTER — Encounter (HOSPITAL_BASED_OUTPATIENT_CLINIC_OR_DEPARTMENT_OTHER): Payer: PRIVATE HEALTH INSURANCE | Admitting: Physician Assistant

## 2019-08-07 ENCOUNTER — Encounter (HOSPITAL_BASED_OUTPATIENT_CLINIC_OR_DEPARTMENT_OTHER): Payer: Medicare Other | Attending: Internal Medicine | Admitting: Internal Medicine

## 2019-08-07 DIAGNOSIS — L97822 Non-pressure chronic ulcer of other part of left lower leg with fat layer exposed: Secondary | ICD-10-CM | POA: Diagnosis not present

## 2019-08-07 DIAGNOSIS — E11622 Type 2 diabetes mellitus with other skin ulcer: Secondary | ICD-10-CM | POA: Diagnosis not present

## 2019-08-07 DIAGNOSIS — L98492 Non-pressure chronic ulcer of skin of other sites with fat layer exposed: Secondary | ICD-10-CM | POA: Diagnosis not present

## 2019-08-07 DIAGNOSIS — L97213 Non-pressure chronic ulcer of right calf with necrosis of muscle: Secondary | ICD-10-CM | POA: Diagnosis not present

## 2019-08-07 DIAGNOSIS — I69331 Monoplegia of upper limb following cerebral infarction affecting right dominant side: Secondary | ICD-10-CM | POA: Diagnosis not present

## 2019-08-07 DIAGNOSIS — L89322 Pressure ulcer of left buttock, stage 2: Secondary | ICD-10-CM | POA: Diagnosis not present

## 2019-08-07 DIAGNOSIS — L97812 Non-pressure chronic ulcer of other part of right lower leg with fat layer exposed: Secondary | ICD-10-CM | POA: Insufficient documentation

## 2019-08-07 NOTE — Progress Notes (Signed)
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 about pustules and erosions at that time. They thought some of these were infectious and put him on Keflex. Since then he has been on different courses of antibiotics including in June of this year I gave him a course of Levaquin. He was in the ER later in June and was given Keflex and doxycycline. 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 I really do not believe these are primary bacterial/infectious issues. Although the lesions look like erosions when they are new they have almost hyper granular appearance but underneath this there is fluid. This is what is led me to call these blisters Objective Constitutional Patient is hypertensive.. Pulse regular and within target range for patient.Marland Kitchen Respirations regular, non-labored and within target range.. Temperature is  normal and within the target range for the patient.Marland Kitchen Appears in no distress. Vitals  Time Taken: 11:13 AM, Height: 67 in, Weight: 130 lbs, BMI: 20.4, Temperature: 98.2 F, Pulse: 76 bpm, Respiratory Rate: 16 breaths/min, Blood Pressure: 171/98 mmHg. Eyes Conjunctivae clear. No discharge.no icterus. Cardiovascular Pedal pulses are palpable. Neurological Does not appear to be any change in his neurologic status. Psychiatric appears at normal baseline. General Notes: Wound exam; essentially as described there more wounds than last time. These are blisters and erosions. I really believe these started as blisters before subsequently eroding. I see nothing that looks like a primary infection. There is no pustules no surrounding erythema. There is a lot of denuded skin in the plantar aspect of the right hand but no evidence of infection Integumentary (Hair, Skin) Wound #1 status is Open. Original cause of wound was Gradually Appeared. The wound is located on the Right Hand - Dorsum. The wound measures 14cm length x 24cm width x 0.1cm depth; 263.894cm^2 area and 26.389cm^3 volume. There is Fat Layer (Subcutaneous Tissue) Exposed exposed. There is no tunneling or undermining noted. There is a large amount of purulent drainage noted. The wound margin is indistinct and nonvisible. There is large (67-100%) red granulation within the wound bed. There is a small (1-33%) amount of necrotic tissue within the wound bed including Adherent Slough. Wound #14 status is Open. Original cause of wound was Gradually Appeared. The wound is located on the Left,Proximal,Anterior Lower Leg. The wound measures 1.5cm length x 2.5cm width x 0.1cm depth; 2.945cm^2 area and 0.295cm^3 volume. There is Fat Layer (Subcutaneous Tissue) Exposed exposed. There is no tunneling or undermining noted. There is a medium amount of serosanguineous drainage noted. The wound margin is distinct with the outline attached to the wound base.  There is large (67-100%) red, hyper - granulation within the wound bed. There is no necrotic tissue within the wound bed. Wound #15 status is Open. Original cause of wound was Gradually Appeared. The wound is located on the Left,Lateral Lower Leg. The wound measures 3.7cm length x 1.5cm width x 0.1cm depth; 4.359cm^2 area and 0.436cm^3 volume. There is Fat Layer (Subcutaneous Tissue) Exposed exposed. There is no tunneling or undermining noted. There is a medium amount of serosanguineous drainage noted. The wound margin is flat and intact. There is large (67-100%) red, friable, hyper - granulation within the wound bed. There is no necrotic tissue within the wound bed. Wound #16 status is Open. Original cause of wound was Gradually Appeared. The wound is located on the Right,Proximal,Anterior Lower Leg. The wound measures 0.7cm length x 0.7cm width x 0.1cm depth; 0.385cm^2 area and 0.038cm^3 volume. There is Fat Layer (Subcutaneous Tissue) Exposed exposed. There is no tunneling or undermining noted. There is a medium amount of serosanguineous drainage noted. The wound margin is indistinct and nonvisible. There is large (67-100%) red, pink, friable, hyper - granulation within the wound bed. There is no necrotic tissue within the wound bed. Wound #17 status is Open. Original cause of wound was Gradually Appeared. The wound is located on the Left,Proximal,Lateral Lower Leg. The wound measures 2cm length x 1.4cm width x 0.1cm depth; 2.199cm^2 area and 0.22cm^3 volume. There is Fat Layer (Subcutaneous Tissue) Exposed exposed. There is no tunneling or undermining noted. There is a medium amount of serosanguineous drainage noted. The wound margin is indistinct and nonvisible. There is large (67-100%) red, hyper - granulation within the wound bed. There is no necrotic tissue within the wound bed. Wound #5 status is Open. Original cause of wound was Gradually Appeared. The wound is located on the  Right,Anterior  Lower Leg. The wound measures 5.3cm length x 3.2cm width x 0.2cm depth; 13.32cm^2 area and 2.664cm^3 volume. There is Fat Layer (Subcutaneous Tissue) Exposed exposed. There is no tunneling or undermining noted. There is a medium amount of serosanguineous drainage noted. The wound margin is flat and intact. There is large (67-100%) red, hyper - granulation within the wound bed. There is no necrotic tissue within the wound bed. Wound #6 status is Open. Original cause of wound was Gradually Appeared. The wound is located on the Right,Lateral,Posterior Lower Leg. The wound measures 13cm length x 3.5cm width x 0.1cm depth; 35.736cm^2 area and 3.574cm^3 volume. There is Fat Layer (Subcutaneous Tissue) Exposed exposed. There is no tunneling or undermining noted. There is a medium amount of serosanguineous drainage noted. The wound margin is flat and intact. There is large (67-100%) red, friable granulation within the wound bed. There is no necrotic tissue within the wound bed. Wound #8 status is Open. Original cause of wound was Gradually Appeared. The wound is located on the Left,Anterior Lower Leg. The wound measures 16cm length x 5cm width x 0.1cm depth; 62.832cm^2 area and 6.283cm^3 volume. There is Fat Layer (Subcutaneous Tissue) Exposed exposed. There is no tunneling or undermining noted. There is a medium amount of serosanguineous drainage noted. The wound margin is flat and intact. There is large (67-100%) red, friable, hyper - granulation within the wound bed. There is no necrotic tissue within the wound bed. 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 - daily as needed to drainage Skin Barriers/Peri-Wound Care: Moisturizing lotion - to both legs and feet with dressing changes Wound Cleansing: Clean wound with Wound Cleanser - all wounds Primary Wound Dressing: Wound #1 Right Hand - Dorsum: Calcium Alginate with Silver - entire right hand as well. Wound #14 Left,Proximal,Anterior Lower Leg: Calcium Alginate with Silver Wound #15 Left,Lateral Lower Leg: Calcium Alginate with Silver Wound #16 Right,Proximal,Anterior Lower Leg: Calcium Alginate with Silver Wound #17 Left,Proximal,Lateral Lower Leg: Calcium Alginate with Silver Wound #5 Right,Anterior Lower Leg: Calcium Alginate with Silver Calcium Alginate with Silver Wound #6 Right,Lateral,Posterior Lower Leg: Calcium Alginate with Silver Wound #8 Left,Anterior Lower Leg: Calcium Alginate with Silver Secondary Dressing: Kerlix/Rolled Gauze Dry Gauze - all wounds Edema Control: Elevate legs to the level of the heart or above for 30 minutes daily and/or when sitting, a frequency of: - throughout the day 1. I continue to use silver alginate and kerlix as the primary dressing. I do not think anything else really makes sense in terms of the wounds surface area 2. I do not believe that this is a primary bacterial issue or an infectious issue 3. I continue to think that this could easily be a blistering skin disease. I am not set up. T do direct immunofluorescence tissue biopsies. I have reviewed the o dermatology notes from [facility] last seen on 03/28/2018 they continue to call this a pustular bacterial infection. I do not think that is what this is 4. Within these limitations I continue to offer our clinic for palliative follow-up Electronic Signature(s) Signed: 08/07/2019 5:20:32 PM By: Dellia Nims,  Legrand Como MD Entered By: Linton Ham on 08/07/2019  12:55:50 -------------------------------------------------------------------------------- SuperBill Details Patient Name: Date of Service: RACHAEL, ZAPANTA [name] 08/07/2019 Medical Record Number: [id number] Patient Account Number: [id number] Date of Birth/Sex: Treating RN: 03/12/1945 (74 y.o. [name] [name] Primary Care Provider: Theodora Blow, MIKE Other Clinician: Referring Provider: Treating Provider/Extender: Joanne Chars, MIKE Weeks in Treatment: 11 Diagnosis Coding ICD-10 Codes Code Description [phone] Non-pressure chronic ulcer of right calf with necrosis of muscle L97.811 Non-pressure chronic ulcer of other part of right lower leg limited to breakdown of skin S61.401D Unspecified open wound of right hand, subsequent encounter L89.322 Pressure ulcer of left buttock, stage 2 L97.821 Non-pressure chronic ulcer of other part of left lower leg limited to breakdown of skin Facility Procedures Physician Procedures : CPT4 Code Description Modifier [id number] - WC PHYS LEVEL 4 - EST PT ICD-10 Diagnosis Description L97.213 Non-pressure chronic ulcer of right calf with necrosis of muscle S61.401D Unspecified open wound of right hand, subsequent encounter L97.821  Non-pressure chronic ulcer of other part of left lower leg limited to breakdown of skin L97.811 Non-pressure chronic ulcer of other part of right lower leg limited to breakdown of skin Quantity: 1 Electronic Signature(s) Signed: 08/07/2019 5:20:32 PM By: Linton Ham MD Entered By: Linton Ham on 08/07/2019 12:56:12

## 2019-08-25 ENCOUNTER — Encounter (HOSPITAL_BASED_OUTPATIENT_CLINIC_OR_DEPARTMENT_OTHER): Payer: Medicare Other | Admitting: Internal Medicine

## 2019-09-25 ENCOUNTER — Encounter (HOSPITAL_BASED_OUTPATIENT_CLINIC_OR_DEPARTMENT_OTHER): Payer: Medicare Other | Attending: Internal Medicine | Admitting: Internal Medicine

## 2019-09-25 DIAGNOSIS — L89322 Pressure ulcer of left buttock, stage 2: Secondary | ICD-10-CM | POA: Insufficient documentation

## 2019-09-25 DIAGNOSIS — R54 Age-related physical debility: Secondary | ICD-10-CM | POA: Diagnosis not present

## 2019-09-25 DIAGNOSIS — Z8673 Personal history of transient ischemic attack (TIA), and cerebral infarction without residual deficits: Secondary | ICD-10-CM | POA: Diagnosis not present

## 2019-09-25 DIAGNOSIS — E1151 Type 2 diabetes mellitus with diabetic peripheral angiopathy without gangrene: Secondary | ICD-10-CM | POA: Insufficient documentation

## 2019-09-25 DIAGNOSIS — L97213 Non-pressure chronic ulcer of right calf with necrosis of muscle: Secondary | ICD-10-CM | POA: Insufficient documentation

## 2019-09-25 DIAGNOSIS — I10 Essential (primary) hypertension: Secondary | ICD-10-CM | POA: Insufficient documentation

## 2019-09-25 DIAGNOSIS — G8220 Paraplegia, unspecified: Secondary | ICD-10-CM | POA: Diagnosis not present

## 2019-09-25 DIAGNOSIS — E11622 Type 2 diabetes mellitus with other skin ulcer: Secondary | ICD-10-CM | POA: Insufficient documentation

## 2019-09-25 DIAGNOSIS — L97821 Non-pressure chronic ulcer of other part of left lower leg limited to breakdown of skin: Secondary | ICD-10-CM | POA: Insufficient documentation

## 2019-09-25 DIAGNOSIS — L97811 Non-pressure chronic ulcer of other part of right lower leg limited to breakdown of skin: Secondary | ICD-10-CM | POA: Diagnosis not present

## 2019-09-25 DIAGNOSIS — M7989 Other specified soft tissue disorders: Secondary | ICD-10-CM | POA: Diagnosis not present

## 2019-09-25 NOTE — Progress Notes (Addendum)
VERGIL, BURBY ([id number]) Visit Report for 09/25/2019 Arrival Information Details Patient Name: Date of Service: [name] [name], [name] [name] 09/25/2019 10:45 A M Medical Record Number: [id number] Patient Account Number: [id number] Date of Birth/Sex: Treating RN: [birth year] (74 y.o. Jerilynn Mages) Carlene Coria Primary Care Avyonna Wagoner: Theodora Blow, MIKE Other Clinician: Referring Loa Idler: Treating Niraj Kudrna/Extender: Joanne Chars, MIKE Weeks in Treatment: 65 Visit Information History Since Last Visit All ordered tests and consults were completed: No Patient Arrived: Wheel Chair Added or deleted any medications: No Arrival Time: 11:07 Any new allergies or adverse reactions: No Accompanied By: Corey Harold Had a fall or experienced change in No Transfer Assistance: Stretcher activities of daily living that may affect Patient Identification Verified: Yes risk of falls: Secondary Verification Process Completed: Yes Signs or symptoms of abuse/neglect since last visito No Patient Requires Transmission-Based Precautions: No Hospitalized since last visit: No Patient Has Alerts: Yes Implantable device outside of the clinic excluding No Patient Alerts: R ABI non compressible cellular tissue based products placed in the center since last visit: Has Dressing in Place as Prescribed: Yes Pain Present Now: Yes Electronic Signature(s) Signed: 09/25/2019 4:58:04 PM By: Carlene Coria RN Entered By: Carlene Coria on 09/25/2019 11:07:28 -------------------------------------------------------------------------------- Clinic Level of Care Assessment Details Patient Name: Date of Service: [name] [name], [name] [name] 09/25/2019 10:45 A M Medical Record Number: [id number] Patient Account Number: [id number] Date of Birth/Sex: Treating RN: 10-Mar-1945 (74 y.o. [name] Primary Care Nyjai Graff: Theodora Blow, MIKE Other Clinician: Referring Anthonette Lesage: Treating Coreen Shippee/Extender: Joanne Chars, MIKE Weeks in Treatment: 90 Clinic  Level of Care Assessment Items TOOL 4 Quantity Score X- 1 0 Use when only an EandM is performed on FOLLOW-UP visit ASSESSMENTS - Nursing Assessment / Reassessment [] - 0 Reassessment of Co-morbidities (includes updates in patient status) [] - 0 Reassessment of Adherence to Treatment Plan ASSESSMENTS - Wound and Skin A ssessment / Reassessment [] - 0 Simple Wound Assessment / Reassessment - one wound X- 5 5 Complex Wound Assessment / Reassessment - multiple wounds [] - 0 Dermatologic / Skin Assessment (not related to wound area) ASSESSMENTS - Focused Assessment X- 1 5 Circumferential Edema Measurements - multi extremities [] - 0 Nutritional Assessment / Counseling / Intervention [] - 0 Lower Extremity Assessment (monofilament, tuning fork, pulses) [] - 0 Peripheral Arterial Disease Assessment (using hand held doppler) ASSESSMENTS - Ostomy and/or Continence Assessment and Care [] - 0 Incontinence Assessment and Management [] - 0 Ostomy Care Assessment and Management (repouching, etc.) PROCESS - Coordination of Care X - Simple Patient / Family Education for ongoing care 1 15 [] - 0 Complex (extensive) Patient / Family Education for ongoing care X- 1 10 Staff obtains Programmer, systems, Records, T Results / Process Orders est [] - 0 Staff telephones HHA, Nursing Homes / Clarify orders / etc [] - 0 Routine Transfer to another Facility (non-emergent condition) [] - 0 Routine Hospital Admission (non-emergent condition) [] - 0 New Admissions / Biomedical engineer / Ordering NPWT Apligraf, etc. , [] - 0 Emergency Hospital Admission (emergent condition) X- 1 10 Simple Discharge Coordination [] - 0 Complex (extensive) Discharge Coordination PROCESS - Special Needs [] - 0 Pediatric / Minor Patient Management [] - 0 Isolation Patient Management [] - 0 Hearing / Language / Visual special needs [] - 0 Assessment of Community assistance (transportation, D/C planning, etc.) []  - 0 Additional assistance / Altered mentation [] - 0 Support Surface(s) Assessment (bed, cushion, seat, etc.) INTERVENTIONS - Wound Cleansing / Measurement [] - 0  Simple Wound Cleansing - one wound X- 5 5 Complex Wound Cleansing - multiple wounds X- 1 5 Wound Imaging (photographs - any number of wounds) [] - 0 Wound Tracing (instead of photographs) [] - 0 Simple Wound Measurement - one wound X- 5 5 Complex Wound Measurement - multiple wounds INTERVENTIONS - Wound Dressings [] - 0 Small Wound Dressing one or multiple wounds X- 5 15 Medium Wound Dressing one or multiple wounds [] - 0 Large Wound Dressing one or multiple wounds X- 1 5 Application of Medications - topical [] - 0 Application of Medications - injection INTERVENTIONS - Miscellaneous [] - 0 External ear exam [] - 0 Specimen Collection (cultures, biopsies, blood, body fluids, etc.) [] - 0 Specimen(s) / Culture(s) sent or taken to Lab for analysis [] - 0 Patient Transfer (multiple staff / Civil Service fast streamer / Similar devices) [] - 0 Simple Staple / Suture removal (25 or less) [] - 0 Complex Staple / Suture removal (26 or more) [] - 0 Hypo / Hyperglycemic Management (close monitor of Blood Glucose) [] - 0 Ankle / Brachial Index (ABI) - do not check if billed separately X- 1 5 Vital Signs Has the patient been seen at the hospital within the last three years: Yes Total Score: 205 Level Of Care: New/Established - Level 5 Electronic Signature(s) Signed: 09/25/2019 4:40:23 PM By: Kela Millin Entered By: Kela Millin on 09/25/2019 11:32:44 -------------------------------------------------------------------------------- Encounter Discharge Information Details Patient Name: Date of Service: [name] [name] 09/25/2019 10:45 A M Medical Record Number: [id number] Patient Account Number: [id number] Date of Birth/Sex: Treating RN: 07/19/1945 (74 y.o. [name] Primary Care Ashok Sawaya: Theodora Blow, MIKE Other  Clinician: Referring Franca Stakes: Treating Jeannett Dekoning/Extender: Joanne Chars, MIKE Weeks in Treatment: 72 Encounter Discharge Information Items Discharge Condition: Stable Ambulatory Status: Stretcher Discharge Destination: Home Transportation: Ambulance Accompanied By: EMS crew Schedule Follow-up Appointment: Yes Clinical Summary of Care: Notes bunny boots to both feet. Electronic Signature(s) Signed: 09/25/2019 4:55:25 PM By: Deon Pilling Entered By: Deon Pilling on 09/25/2019 12:31:25 -------------------------------------------------------------------------------- Lower Extremity Assessment Details Patient Name: Date of Service: [name] [name], [name] [name] 09/25/2019 10:45 A M Medical Record Number: [id number] Patient Account Number: [id number] Date of Birth/Sex: Treating RN: 26-Sep-1945 (74 y.o. Jerilynn Mages) Carlene Coria Primary Care Dencil Cayson: Theodora Blow, MIKE Other Clinician: Referring Airrion Otting: Treating Tamantha Saline/Extender: Joanne Chars, MIKE Weeks in Treatment: 90 Edema Assessment Assessed: [Left: No] [Right: No] Edema: [Left: Yes] [Right: Yes] Calf Left: Right: Point of Measurement: cm From Medial Instep 37 cm 37.4 cm Ankle Left: Right: Point of Measurement: cm From Medial Instep 17.5 cm 17.8 cm Electronic Signature(s) Signed: 09/25/2019 4:58:04 PM By: Carlene Coria RN Entered By: Carlene Coria on 09/25/2019 11:10:16 -------------------------------------------------------------------------------- Multi Wound Chart Details Patient Name: Date of Service: [name] [name] 09/25/2019 10:45 A M Medical Record Number: [id number] Patient Account Number: [id number] Date of Birth/Sex: Treating RN: [birth year] (74 y.o. [name] Primary Care Jailani Hogans: Theodora Blow, MIKE Other Clinician: Referring Kearsten Ginther: Treating Maxyne Derocher/Extender: Joanne Chars, MIKE Weeks in Treatment: 90 Vital Signs Height(in): 67 Pulse(bpm): 92 Weight(lbs): 130 Blood Pressure(mmHg):  118/78 Body Mass Index(BMI): 20 Temperature(F): 97.1 Respiratory Rate(breaths/min): 16 Photos: [1:No Photos Right Hand - Dorsum] [14:No Photos Left, Proximal, Anterior Lower Leg] [15:No Photos Left, Lateral Lower Leg] Wound Location: [1:Gradually Appeared] [14:Gradually Appeared] [15:Gradually Appeared] Wounding Event: [1:Atypical] [14:Atypical] [15:Atypical] Primary Etiology: [1:Anemia, Hypertension, Type II] [14:N/A] [15:N/A] Comorbid History: [1:Diabetes, Paraplegia 11/15/2017] [14:05/20/2019] [15:07/10/2019] Date Acquired: [1:90] [14:18] [15:11] Weeks of Treatment: [1:Open] [14:Converted] [15:Converted]  Wound Status: [1:Yes] [14:Yes] [15:Yes] Clustered Wound: [1:4] [14:N/A] [15:N/A] Clustered Quantity: [1:28x12x0.1] [14:21x9x0.1] [15:21x9x0.1] Measurements L x W x D (cm) [1:263.894] [14:148.44] [15:148.44] A (cm) : rea [1:26.389] [80:99.833] [15:14.844] Volume (cm) : [1:-727.60%] [14:-208970.40%] [15:-2262.60%] % Reduction in A [1:rea: -727.50%] [14:-211957.10%] [15:-2263.70%] % Reduction in Volume: [1:Full Thickness Without Exposed] [14:Full Thickness Without Exposed] [15:Full Thickness Without Exposed] Classification: [1:Support Structures Medium] [14:Support Structures N/A] [15:Support Structures N/A] Exudate A mount: [1:Purulent] [14:N/A] [15:N/A] Exudate Type: [1:yellow, brown, green] [14:N/A] [15:N/A] Exudate Color: [1:Indistinct, nonvisible] [14:N/A] [15:N/A] Wound Margin: [1:Medium (34-66%)] [14:N/A] [15:N/A] Granulation A mount: [1:Red] [14:N/A] [15:N/A] Granulation Quality: [1:Medium (34-66%)] [14:N/A] [15:N/A] Necrotic A mount: [1:Fat Layer (Subcutaneous Tissue): Yes N/A] [15:N/A] Exposed Structures: [1:Fascia: No Tendon: No Muscle: No Joint: No Bone: No Medium (34-66%)] [14:N/A] [15:N/A] Epithelialization: [1:N/A] [14:N/A] [15:N/A] Debridement: [1:N/A] [14:N/A] [15:N/A] Instrument: [1:N/A] [14:N/A] [15:N/A] Bleeding: Debridement Treatment Response: N/A [14:N/A]  [15:N/A] Post Debridement Measurements L x N/A [14:N/A] [15:N/A] W x D (cm) [1:N/A] [14:N/A] [15:N/A] Post Debridement Volume: (cm) [1:N/A] [14:N/A] [15:N/A] Assessment Notes: [1:N/A] [14:N/A] [15:N/A] Wound Number: _0 Photos: No Photos No Photos No Photos Right, Proximal, Anterior Lower Leg Left, Proximal, Lateral Lower Leg Right, Anterior Lower Leg Wound Location: Gradually Appeared Gradually Appeared Gradually Appeared 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 Primary Etiology: Extremity Anemia, Hypertension, Type II N/A Anemia, Hypertension, Type II Comorbid History: Diabetes, Paraplegia Diabetes, Paraplegia 07/10/2019 08/07/2019 11/15/2017 Date Acquired: 11 7 90 Weeks of Treatment: Open Converted Open Wound Status: No No Yes Clustered Wound: N/A N/A 2 Clustered Quantity: 1.5x2.5x0.1 21x9x0.1 16x5x0.1 Measurements L x W x D (cm) 2.945 148.44 62.832 A (cm) : rea 0.295 14.844 6.283 Volume (cm) : -435.50% -6650.30% 45.50% % Reduction in A rea: -436.40% -6647.30% 45.50% % Reduction in Volume: Full Thickness Without Exposed Full Thickness Without Exposed Grade 2 Classification: Support Structures Support Structures Medium N/A Medium Exudate A mount: Serosanguineous N/A Serosanguineous Exudate Type: red, brown N/A red, brown Exudate Color: Indistinct, nonvisible N/A Flat and Intact Wound Margin: Large (67-100%) N/A Medium (34-66%) Granulation A mount: Red, Pink, Hyper-granulation, Friable N/A Red, Hyper-granulation Granulation Quality: None Present (0%) N/A Medium (34-66%) Necrotic A mount: Fat Layer (Subcutaneous Tissue): Yes N/A Fat Layer (Subcutaneous Tissue): Yes Exposed Structures: Fascia: No Fascia: No Tendon: No Tendon: No Muscle: No Muscle: No Joint: No Joint: No Bone: No Bone: No Small (1-33%) N/A Medium (34-66%) Epithelialization: Chemical/Enzymatic/Mechanical N/A N/A Debridement: N/A N/A  N/A Instrument: None N/A N/A Bleeding: Debridement Treatment Response: Procedure was tolerated well N/A N/A Post Debridement Measurements L x 1.5x2.5x0.1 N/A N/A W x D (cm) 0.295 N/A N/A Post Debridement Volume: (cm) N/A N/A N/A Assessment Notes: Debridement N/A N/A Procedures Performed: Wound Number: 6 8 N/A Photos: No Photos No Photos N/A Right, Lateral, Posterior Lower Leg Left, Anterior Lower Leg N/A Wound Location: Gradually Appeared Gradually Appeared N/A Wounding Event: 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 N/A Primary Etiology: Extremity Extremity Anemia, Hypertension, Type II Anemia, Hypertension, Type II N/A Comorbid History: Diabetes, Paraplegia Diabetes, Paraplegia 11/15/2017 11/15/2017 N/A Date Acquired: 90 90 N/A Weeks of Treatment: Open Open N/A Wound Status: Yes Yes N/A Clustered Wound: 1 9 N/A Clustered Quantity: 12x5.5x0.1 21x9x0.1 N/A Measurements L x W x D (cm) 51.836 148.44 N/A A (cm) : rea 5.184 14.844 N/A Volume (cm) : -91.60% -628.00% N/A % Reduction in A rea: -91.60% -628.00% N/A % Reduction in Volume: Grade 2 Grade 2 N/A Classification: Medium Medium N/A Exudate  A mount: Serosanguineous Serosanguineous N/A Exudate Type: red, brown red, brown N/A Exudate Color: Flat and Intact Flat and Intact N/A Wound Margin: Medium (34-66%) Medium (34-66%) N/A Granulation A mount: Red, Friable Red, Hyper-granulation, Friable N/A Granulation Quality: Medium (34-66%) Medium (34-66%) N/A Necrotic A mount: Fat Layer (Subcutaneous Tissue): Yes Fat Layer (Subcutaneous Tissue): Yes N/A Exposed Structures: Fascia: No Fascia: No Tendon: No Tendon: No Muscle: No Muscle: No Joint: No Joint: No Bone: No Bone: No Medium (34-66%) Large (67-100%) N/A Epithelialization: N/A N/A N/A Debridement: N/A N/A N/A Instrument: N/A N/A N/A Bleeding: Debridement Treatment Response: N/A N/A N/A Post Debridement  Measurements L x N/A N/A N/A W x D (cm) N/A N/A N/A Post Debridement Volume: (cm) N/A coverted 14, 15 and 17 into 1 number N/A Assessment Notes: 8 N/A N/A N/A Procedures Performed: Treatment Notes Wound #1 (Right Hand - Dorsum) 1. Cleanse With Wound Cleanser 3. Primary Dressing Applied Calcium Alginate Ag 4. Secondary Dressing ABD Pad Dry Gauze Roll Gauze 5. Secured With Medipore tape Wound #16 (Right, Proximal, Anterior Lower Leg) 1. Cleanse With Wound Cleanser 3. Primary Dressing Applied Calcium Alginate Ag 4. Secondary Dressing ABD Pad Dry Gauze Roll Gauze 5. Secured With Medipore tape Wound #5 (Right, Anterior Lower Leg) 1. Cleanse With Wound Cleanser 3. Primary Dressing Applied Calcium Alginate Ag 4. Secondary Dressing ABD Pad Dry Gauze Roll Gauze 5. Secured With Medipore tape Wound #6 (Right, Lateral, Posterior Lower Leg) 1. Cleanse With Wound Cleanser 3. Primary Dressing Applied Calcium Alginate Ag 4. Secondary Dressing ABD Pad Dry Gauze Roll Gauze 5. Secured With Medipore tape Wound #8 (Left, Anterior Lower Leg) 1. Cleanse With Wound Cleanser 3. Primary Dressing Applied Calcium Alginate Ag 4. Secondary Dressing ABD Pad Dry Gauze Roll Gauze 5. Secured With [employer]) Signed: 09/28/2019 6:04:50 PM By: Kela Millin Signed: 09/28/2019 7:26:40 PM By: Linton Ham MD Signed: 09/28/2019 7:26:40 PM By: Linton Ham MD Entered By: Linton Ham on 09/26/2019 16:00:51 -------------------------------------------------------------------------------- Multi-Disciplinary Care Plan Details Patient Name: Date of Service: LATRON, RIBAS [name] 09/25/2019 10:45 A M Medical Record Number: [id number] Patient Account Number: [id number] Date of Birth/Sex: Treating RN: [birth year] (74 y.o. [name] Primary Care Toinette Lackie: Theodora Blow, MIKE Other Clinician: Referring Glinda Natzke: Treating Verbon Giangregorio/Extender: Joanne Chars, MIKE Weeks in Treatment: 36 Active Inactive Abuse / Safety / Falls / Self Care Management Nursing Diagnoses: Impaired physical mobility Potential for injury related to transfers Goals: Patient will not develop complications from immobility Date Initiated: 12/31/2017 Date Inactivated: 02/10/2018 Target Resolution Date: 01/31/2018 Goal Status: Met Patient/caregiver will verbalize understanding of skin care regimen Date Initiated: 12/31/2017 Target Resolution Date: 11/13/2019 Goal Status: Active Patient/caregiver will verbalize/demonstrate measures taken to prevent injury and/or falls Date Initiated: 12/31/2017 Date Inactivated: 02/10/2018 Target Resolution Date: 01/31/2018 Goal Status: Met Interventions: Assess Activities of Daily Living upon admission and as needed Assess fall risk on admission and as needed Assess: immobility, friction, shearing, incontinence upon admission and as needed Assess impairment of mobility on admission and as needed per policy Assess personal safety and home safety (as indicated) on admission and as needed Assess self care needs on admission and as needed Notes: Wound/Skin Impairment Nursing Diagnoses: Knowledge deficit related to ulceration/compromised skin integrity Goals: Patient/caregiver will verbalize understanding of skin care regimen Date Initiated: 05/20/2019 Target Resolution Date: 11/13/2019 Goal Status: Active Ulcer/skin breakdown will have a volume reduction of 30% by week 4 Date Initiated: 12/31/2017 Date Inactivated: 05/20/2019 Target Resolution Date: 06/18/2018 Goal Status: Met Interventions: Assess  patient/caregiver ability to obtain necessary supplies Assess patient/caregiver ability to perform ulcer/skin care regimen upon admission and as needed Assess ulceration(s) every visit Notes: Electronic Signature(s) Signed: 09/25/2019 4:40:23 PM By: Kela Millin Entered By: Kela Millin on 09/25/2019  11:34:26 -------------------------------------------------------------------------------- Pain Assessment Details Patient Name: Date of Service: DAKWON, WENBERG [name] 09/25/2019 10:45 A M Medical Record Number: [id number] Patient Account Number: [id number] Date of Birth/Sex: Treating RN: Nov 08, 1945 (74 y.o. Jerilynn Mages) Carlene Coria Primary Care Janeane Cozart: Theodora Blow, MIKE Other Clinician: Referring Rivers Hamrick: Treating Azzie Thiem/Extender: Joanne Chars, MIKE Weeks in Treatment: 90 Active Problems Location of Pain Severity and Description of Pain Patient Has Paino Yes Site Locations With Dressing Change: Yes Duration of the Pain. Constant / Intermittento Constant Rate the pain. Current Pain Level: 4 Worst Pain Level: 7 Least Pain Level: 2 Tolerable Pain Level: 5 Character of Pain Describe the Pain: Aching, Burning Pain Management and Medication Current Pain Management: Medication: No Cold Application: No Rest: No Massage: No Activity: No T.E.N.S.: No Heat Application: No Leg drop or elevation: No Is the Current Pain Management Adequate: Inadequate How does your wound impact your activities of daily livingo Sleep: No Bathing: No Appetite: No Relationship With Others: No Bladder Continence: No Emotions: No Bowel Continence: No Work: No Toileting: No Drive: No Dressing: No Hobbies: No Electronic Signature(s) Signed: 09/25/2019 4:58:04 PM By: Carlene Coria RN Entered By: Carlene Coria on 09/25/2019 11:09:19 -------------------------------------------------------------------------------- Patient/Caregiver Education Details Patient Name: Date of Service: [name] [name] 9/10/2021andnbsp10:45 A M Medical Record Number: [id number] Patient Account Number: [id number] Date of Birth/Gender: Treating RN: 03-19-1945 (74 y.o. [name] Primary Care Physician: Theodora Blow, MIKE Other Clinician: Referring Physician: Treating Physician/Extender: Joanne Chars,  MIKE Weeks in Treatment: 27 Education Assessment Education Provided To: Patient Education Topics Provided Wound/Skin Impairment: Handouts: Caring for Your Ulcer Methods: Explain/Verbal Responses: State content correctly Electronic Signature(s) Signed: 09/25/2019 4:40:23 PM By: Kela Millin Entered By: Kela Millin on 09/25/2019 11:34:41 -------------------------------------------------------------------------------- Wound Assessment Details Patient Name: Date of Service: [name] [name], [name] [name] 09/25/2019 10:45 A M Medical Record Number: [id number] Patient Account Number: [id number] Date of Birth/Sex: Treating RN: 15-Feb-1945 (74 y.o. Jerilynn Mages) Carlene Coria Primary Care Kenyatte Chatmon: Theodora Blow, MIKE Other Clinician: Referring Shaeleigh Graw: Treating Cris Talavera/Extender: Joanne Chars, MIKE Weeks in Treatment: 90 Wound Status Wound Number: 1 Primary Etiology: Atypical Wound Location: Right Hand - Dorsum Wound Status: Open Wounding Event: Gradually Appeared Comorbid History: Anemia, Hypertension, Type II Diabetes, Paraplegia Date Acquired: 11/15/2017 Weeks Of Treatment: 90 Clustered Wound: Yes Wound Measurements Length: (cm) 28 Width: (cm) 12 Depth: (cm) 0.1 Clustered Quantity: 4 Area: (cm) 263.894 Volume: (cm) 26.389 % Reduction in Area: -727.6% % Reduction in Volume: -727.5% Epithelialization: Medium (34-66%) Tunneling: No Undermining: No Wound Description Classification: Full Thickness Without Exposed Support Structures Wound Margin: Indistinct, nonvisible Exudate Amount: Medium Exudate Type: Purulent Exudate Color: yellow, brown, green Foul Odor After Cleansing: No Slough/Fibrino Yes Wound Bed Granulation Amount: Medium (34-66%) Exposed Structure Granulation Quality: Red Fascia Exposed: No Necrotic Amount: Medium (34-66%) Fat Layer (Subcutaneous Tissue) Exposed: Yes Necrotic Quality: Adherent Slough Tendon Exposed: No Muscle Exposed: No Joint Exposed:  No Bone Exposed: No Treatment Notes Wound #1 (Right Hand - Dorsum) 1. Cleanse With Wound Cleanser 3. Primary Dressing Applied Calcium Alginate Ag 4. Secondary Dressing ABD Pad Dry Gauze Roll Gauze 5. Secured With [employer]) Signed: 09/25/2019 4:58:04 PM By: Carlene Coria RN Entered By: Carlene Coria on 09/25/2019 11:06:53 -------------------------------------------------------------------------------- Wound Assessment Details Patient Name: Date of Service:  [name] [name], [name] [name] 09/25/2019 10:45 A M Medical Record Number: [id number] Patient Account Number: [id number] Date of Birth/Sex: Treating RN: 21-Sep-1945 (74 y.o. Jerilynn Mages) Carlene Coria Primary Care : Theodora Blow, MIKE Other Clinician: Referring : Treating /Extender: Joanne Chars, MIKE Weeks in Treatment: 90 Wound Status Wound Number: 14 Primary Etiology: Atypical Wound Location: Left, Proximal, Anterior Lower Leg Wound Status: Converted Wounding Event: Gradually Appeared Date Acquired: 05/20/2019 Weeks Of Treatment: 18 Clustered Wound: Yes Wound Measurements Length: (cm) 21 Width: (cm) 9 Depth: (cm) 0.1 Area: (cm) 148.44 Volume: (cm) 14.844 % Reduction in Area: -208970.4% % Reduction in Volume: -211957.1% Wound Description Classification: Full Thickness Without Exposed Support Structur es Electronic Signature(s) Signed: 09/25/2019 4:58:04 PM By: Carlene Coria RN Entered By: Carlene Coria on 09/25/2019 11:02:34 -------------------------------------------------------------------------------- Wound Assessment Details Patient Name: Date of Service: [name] [name], [name] [name] 09/25/2019 10:45 A M Medical Record Number: [id number] Patient Account Number: [id number] Date of Birth/Sex: Treating RN: [birth year] (74 y.o. Jerilynn Mages) Carlene Coria Primary Care : Theodora Blow, MIKE Other Clinician: Referring : Treating /Extender: Joanne Chars, MIKE Weeks in  Treatment: 90 Wound Status Wound Number: 15 Primary Etiology: Atypical Wound Location: Left, Lateral Lower Leg Wound Status: Converted Wounding Event: Gradually Appeared Date Acquired: 07/10/2019 Weeks Of Treatment: 11 Clustered Wound: Yes Wound Measurements Length: (cm) 21 Width: (cm) 9 Depth: (cm) 0.1 Area: (cm) 148.44 Volume: (cm) 14.844 % Reduction in Area: -2262.6% % Reduction in Volume: -2263.7% Wound Description Classification: Full Thickness Without Exposed Support Structur es Electronic Signature(s) Signed: 09/25/2019 4:58:04 PM By: Carlene Coria RN Entered By: Carlene Coria on 09/25/2019 11:02:34 -------------------------------------------------------------------------------- Wound Assessment Details Patient Name: Date of Service: [name] [name], [name] [name] 09/25/2019 10:45 A M Medical Record Number: [id number] Patient Account Number: [id number] Date of Birth/Sex: Treating RN: 04/09/1945 (74 y.o. Jerilynn Mages) Carlene Coria Primary Care : Theodora Blow, MIKE Other Clinician: Referring : Treating /Extender: Joanne Chars, MIKE Weeks in Treatment: 90 Wound Status Wound Number: 16 Primary Etiology: Atypical Wound Location: Right, Proximal, Anterior Lower Leg Wound Status: Open Wounding Event: Gradually Appeared Comorbid History: Anemia, Hypertension, Type II Diabetes, Paraplegia Date Acquired: 07/10/2019 Weeks Of Treatment: 11 Clustered Wound: No Wound Measurements Length: (cm) 1.5 Width: (cm) 2.5 Depth: (cm) 0.1 Area: (cm) 2.945 Volume: (cm) 0.295 % Reduction in Area: -435.5% % Reduction in Volume: -436.4% Epithelialization: Small (1-33%) Tunneling: No Undermining: No Wound Description Classification: Full Thickness Without Exposed Support Structures Wound Margin: Indistinct, nonvisible Exudate Amount: Medium Exudate Type: Serosanguineous Exudate Color: red, brown Foul Odor After Cleansing: No Slough/Fibrino No Wound Bed Granulation  Amount: Large (67-100%) Exposed Structure Granulation Quality: Red, Pink, Hyper-granulation, Friable Fascia Exposed: No Necrotic Amount: None Present (0%) Fat Layer (Subcutaneous Tissue) Exposed: Yes Tendon Exposed: No Muscle Exposed: No Joint Exposed: No Bone Exposed: No Treatment Notes Wound #16 (Right, Proximal, Anterior Lower Leg) 1. Cleanse With Wound Cleanser 3. Primary Dressing Applied Calcium Alginate Ag 4. Secondary Dressing ABD Pad Dry Gauze Roll Gauze 5. Secured With [facility]) Signed: 09/25/2019 4:58:04 PM By: Carlene Coria RN Entered By: Carlene Coria on 09/25/2019 11:01:01 -------------------------------------------------------------------------------- Wound Assessment Details Patient Name: Date of Service: [name] [name], [name] [name] 09/25/2019 10:45 A M Medical Record Number: [id number] Patient Account Number: [id number] Date of Birth/Sex: Treating RN: [birth year] (74 y.o. Jerilynn Mages) Carlene Coria Primary Care : Theodora Blow, MIKE Other Clinician: Referring : Treating /Extender: Joanne Chars, MIKE Weeks in Treatment: 90 Wound Status Wound Number: 17 Primary Etiology: Venous Leg Ulcer Wound Location: Left, Proximal, Lateral Lower  Leg Wound Status: Converted Wounding Event: Gradually Appeared Date Acquired: 08/07/2019 Weeks Of Treatment: 7 Clustered Wound: No Wound Measurements Length: (cm) 21 Width: (cm) 9 Depth: (cm) 0.1 Area: (cm) 148.44 Volume: (cm) 14.844 % Reduction in Area: -6650.3% % Reduction in Volume: -6647.3% Wound Description Classification: Full Thickness Without Exposed Support Structur es Electronic Signature(s) Signed: 09/25/2019 4:58:04 PM By: Carlene Coria RN Entered By: Carlene Coria on 09/25/2019 11:02:34 -------------------------------------------------------------------------------- Wound Assessment Details Patient Name: Date of Service: [name] [name], [name] [name] 09/25/2019 10:45 A M Medical  Record Number: [id number] Patient Account Number: [id number] Date of Birth/Sex: Treating RN: July 22, 1945 (74 y.o. Jerilynn Mages) Carlene Coria Primary Care Charli Liberatore: Theodora Blow, MIKE Other Clinician: Referring Azhia Siefken: Treating Jahn Franchini/Extender: Joanne Chars, MIKE Weeks in Treatment: 90 Wound Status Wound Number: 5 Primary Etiology: Diabetic Wound/Ulcer of the Lower Extremity Wound Location: Right, Anterior Lower Leg Wound Status: Open Wounding Event: Gradually Appeared Comorbid History: Anemia, Hypertension, Type II Diabetes, Paraplegia Date Acquired: 11/15/2017 Weeks Of Treatment: 90 Clustered Wound: Yes Wound Measurements Length: (cm) 16 Width: (cm) 5 Depth: (cm) 0.1 Clustered Quantity: 2 Area: (cm) 62.832 Volume: (cm) 6.283 % Reduction in Area: 45.5% % Reduction in Volume: 45.5% Epithelialization: Medium (34-66%) Tunneling: No Undermining: No Wound Description Classification: Grade 2 Wound Margin: Flat and Intact Exudate Amount: Medium Exudate Type: Serosanguineous Exudate Color: red, brown Foul Odor After Cleansing: No Slough/Fibrino Yes Wound Bed Granulation Amount: Medium (34-66%) Exposed Structure Granulation Quality: Red, Hyper-granulation Fascia Exposed: No Necrotic Amount: Medium (34-66%) Fat Layer (Subcutaneous Tissue) Exposed: Yes Necrotic Quality: Adherent Slough Tendon Exposed: No Muscle Exposed: No Joint Exposed: No Bone Exposed: No Treatment Notes Wound #5 (Right, Anterior Lower Leg) 1. Cleanse With Wound Cleanser 3. Primary Dressing Applied Calcium Alginate Ag 4. Secondary Dressing ABD Pad Dry Gauze Roll Gauze 5. Secured With [facility]) Signed: 09/25/2019 4:58:04 PM By: Carlene Coria RN Entered By: Carlene Coria on 09/25/2019 11:04:03 -------------------------------------------------------------------------------- Wound Assessment Details Patient Name: Date of Service: [name] [name], DELAHOUSSAYE [name] 09/25/2019 10:45 A  M Medical Record Number: [id number] Patient Account Number: [id number] Date of Birth/Sex: Treating RN: [birth year] (74 y.o. Jerilynn Mages) Carlene Coria Primary Care Adelfo Diebel: Theodora Blow, MIKE Other Clinician: Referring Danaria Larsen: Treating Pina Sirianni/Extender: Joanne Chars, MIKE Weeks in Treatment: 90 Wound Status Wound Number: 6 Primary Etiology: Diabetic Wound/Ulcer of the Lower Extremity Wound Location: Right, Lateral, Posterior Lower Leg Wound Status: Open Wounding Event: Gradually Appeared Comorbid History: Anemia, Hypertension, Type II Diabetes, Paraplegia Date Acquired: 11/15/2017 Weeks Of Treatment: 90 Clustered Wound: Yes Wound Measurements Length: (cm) 12 Width: (cm) 5.5 Depth: (cm) 0.1 Clustered Quantity: 1 Area: (cm) 51.836 Volume: (cm) 5.184 % Reduction in Area: -91.6% % Reduction in Volume: -91.6% Epithelialization: Medium (34-66%) Tunneling: No Undermining: No Wound Description Classification: Grade 2 Wound Margin: Flat and Intact Exudate Amount: Medium Exudate Type: Serosanguineous Exudate Color: red, brown Foul Odor After Cleansing: No Slough/Fibrino Yes Wound Bed Granulation Amount: Medium (34-66%) Exposed Structure Granulation Quality: Red, Friable Fascia Exposed: No Necrotic Amount: Medium (34-66%) Fat Layer (Subcutaneous Tissue) Exposed: Yes Necrotic Quality: Adherent Slough Tendon Exposed: No Muscle Exposed: No Joint Exposed: No Bone Exposed: No Treatment Notes Wound #6 (Right, Lateral, Posterior Lower Leg) 1. Cleanse With Wound Cleanser 3. Primary Dressing Applied Calcium Alginate Ag 4. Secondary Dressing ABD Pad Dry Gauze Roll Gauze 5. Secured With [facility]) Signed: 09/25/2019 4:58:04 PM By: Carlene Coria RN Entered By: Carlene Coria on 09/25/2019 11:05:01 -------------------------------------------------------------------------------- Wound Assessment Details Patient Name: Date of Service: [name], [name] [name]  09/25/2019 10:45 A M Medical Record Number: [id number] Patient Account Number: [id number] Date of Birth/Sex: Treating RN: July 06, 1945 (74 y.o. Jerilynn Mages) Carlene Coria Primary Care Race Latour: Theodora Blow, MIKE Other Clinician: Referring Maleya Leever: Treating Mantaj Chamberlin/Extender: Joanne Chars, MIKE Weeks in Treatment: 90 Wound Status Wound Number: 8 Primary Etiology: Diabetic Wound/Ulcer of the Lower Extremity Wound Location: Left, Anterior Lower Leg Wound Status: Open Wounding Event: Gradually Appeared Comorbid History: Anemia, Hypertension, Type II Diabetes, Paraplegia Date Acquired: 11/15/2017 Weeks Of Treatment: 90 Clustered Wound: Yes Wound Measurements Length: (cm) 21 Width: (cm) 9 Depth: (cm) 0.1 Clustered Quantity: 9 Area: (cm) 148.44 Volume: (cm) 14.844 % Reduction in Area: -628% % Reduction in Volume: -628% Epithelialization: Large (67-100%) Tunneling: No Undermining: No Wound Description Classification: Grade 2 Wound Margin: Flat and Intact Exudate Amount: Medium Exudate Type: Serosanguineous Exudate Color: red, brown Foul Odor After Cleansing: No Slough/Fibrino Yes Wound Bed Granulation Amount: Medium (34-66%) Exposed Structure Granulation Quality: Red, Hyper-granulation, Friable Fascia Exposed: No Necrotic Amount: Medium (34-66%) Fat Layer (Subcutaneous Tissue) Exposed: Yes Necrotic Quality: Adherent Slough Tendon Exposed: No Muscle Exposed: No Joint Exposed: No Bone Exposed: No Assessment Notes coverted 14, 15 and 17 into 1 number 8 Treatment Notes Wound #8 (Left, Anterior Lower Leg) 1. Cleanse With Wound Cleanser 3. Primary Dressing Applied Calcium Alginate Ag 4. Secondary Dressing ABD Pad Dry Gauze Roll Gauze 5. Secured With [facility]) Signed: 09/25/2019 4:58:04 PM By: Carlene Coria RN Entered By: Carlene Coria on 09/25/2019  11:03:14 -------------------------------------------------------------------------------- [place] Details Patient Name: Date of Service: [name] [name] 09/25/2019 10:45 A M Medical Record Number: [id number] Patient Account Number: [id number] Date of Birth/Sex: Treating RN: [birth year] (74 y.o. Jerilynn Mages) Carlene Coria Primary Care Frazier Balfour: Theodora Blow, MIKE Other Clinician: Referring Mariadelosang Wynns: Treating Simpson Paulos/Extender: Joanne Chars, MIKE Weeks in Treatment: 90 Vital Signs Time Taken: 11:07 Temperature (F): 97.1 Height (in): 67 Pulse (bpm): 92 Weight (lbs): 130 Respiratory Rate (breaths/min): 16 Body Mass Index (BMI): 20.4 Blood Pressure (mmHg): 118/78 Reference Range: 80 - 120 mg / dl Electronic Signature(s) Signed: 09/25/2019 4:58:04 PM By: Carlene Coria RN Entered By: Carlene Coria on 09/25/2019 11:08:11

## 2019-09-28 NOTE — Progress Notes (Signed)
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 aspect the [name]'s right hand. He had been on Levaquin as well as a subsequent course of Keflex and doxycycline. I brought him back in follow-up. Everything seems a bit better. He has bilateral new open areas on his lower extremities which are the same blistered small areas that we have seen previously. 7/23; the [name] arrives back in clinic today for his monthly palliative follow-up. He has an undiagnosed skin edition involving the dorsal aspect of both legs over the tibia and the dorsal aspect of his hand extending into the wrist on the right. All of these have a similar configuration. There are open punched out wounds but I really believe these start as 10 subdermal blisters. In 2020 I sent him to see dermatology at [facility]. They talked about pustules and erosions at that time. They thought some of these were infectious and put him on Keflex. Since then he has been on different  courses of antibiotics including in June of this year I gave him a course of Levaquin. He was in the ER later in June and was given Keflex and doxycycline. 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 I really do not believe these are primary bacterial/infectious issues. Although the lesions look like erosions when they are new they have almost hyper granular appearance but underneath this there is fluid. This is what is led me to call these blisters 9/10; this is a [name] I follow in clinic on a palliative basis. He has an unusual skin condition involving the dorsal aspect of his hand and wrist and the tibial part of both lower extremities. His lesions are raised hypertrophic granular initially with central fluid which eventually form into erosions they have never evolved into more widespread than the areas described. He is effectively quadriparetic. He has been on multiple courses of antibiotics for this without any effect. His daughter used to accompany him but I think she works now. I would like to send him back to dermatology at [facility] and saw him sometime in 2020. Objective Constitutional Vitals Time Taken: 11:07 AM, Height: 67 in, Weight: 130 lbs, BMI: 20.4, Temperature: 97.1 F, Pulse: 92 bpm, Respiratory Rate: 16 breaths/min, Blood Pressure: 118/78 mmHg. Ears, Nose, Mouth, and Throat I could not see in his mouth today.. General Notes: wound exam; again the same combination of hypertension and weight at raise nodules with central fluid and scattered erosions. These are vigorously washed off with wound cleanser and gauze. He doesn't tolerate this well. He also has extensive areas of exfoliated scan on the palm of his hand and fingers which we clean off  when he is here. Integumentary (Hair, Skin) Wound #1 status is Open. Original cause of wound was Gradually Appeared. The wound is located on the Right Hand - Dorsum. The wound measures 28cm length x 12cm width x 0.1cm depth; 263.894cm^2 area and 26.389cm^3 volume. There is Fat Layer (Subcutaneous Tissue) exposed. There is no tunneling or undermining noted. There is a medium amount of purulent drainage noted. The wound margin is indistinct and nonvisible. There is medium (34-66%) red granulation within the wound bed. There is a medium (34-66%) amount of necrotic tissue within the wound bed including Adherent Slough. Wound #14 status is Converted. Original cause of wound was Gradually Appeared. The wound is located on the Left,Proximal,Anterior Lower Leg. The wound measures 21cm length x 9cm width x 0.1cm depth; 148.44cm^2 area and 14.844cm^3 volume. Wound #15 status is Converted. Original cause of wound was Gradually Appeared. The wound is located on the Left,Lateral Lower Leg. The wound measures 21cm length x 9cm width x 0.1cm depth; 148.44cm^2 area and 14.844cm^3 volume. Wound #16 status is Open. Original cause of wound was Gradually Appeared. The wound is located on the Right,Proximal,Anterior Lower Leg. The wound measures 1.5cm length x 2.5cm width x 0.1cm depth; 2.945cm^2 area and 0.295cm^3 volume. There is Fat Layer (Subcutaneous Tissue) exposed. There is no tunneling or undermining noted. There is a medium amount of serosanguineous drainage noted. The wound margin is indistinct and nonvisible. There is large (67- 100%) red, pink, friable, hyper - granulation within the wound bed. There is no necrotic tissue within the wound bed. Wound #17 status is Converted. Original cause of wound was Gradually Appeared. The wound is located on the Left,Proximal,Lateral Lower Leg. The wound measures 21cm length x 9cm width x 0.1cm depth; 148.44cm^2 area and 14.844cm^3 volume. Wound #5 status is  Open. Original  cause of wound was Gradually Appeared. The wound is located on the Right,Anterior Lower Leg. The wound measures 16cm length x 5cm width x 0.1cm depth; 62.832cm^2 area and 6.283cm^3 volume. There is Fat Layer (Subcutaneous Tissue) exposed. There is no tunneling or undermining noted. There is a medium amount of serosanguineous drainage noted. The wound margin is flat and intact. There is medium (34-66%) red, hyper - granulation within the wound bed. There is a medium (34-66%) amount of necrotic tissue within the wound bed including Adherent Slough. Wound #6 status is Open. Original cause of wound was Gradually Appeared. The wound is located on the Right,Lateral,Posterior Lower Leg. The wound measures 12cm length x 5.5cm width x 0.1cm depth; 51.836cm^2 area and 5.184cm^3 volume. There is Fat Layer (Subcutaneous Tissue) exposed. There is no tunneling or undermining noted. There is a medium amount of serosanguineous drainage noted. The wound margin is flat and intact. There is medium (34-66%) red, friable granulation within the wound bed. There is a medium (34-66%) amount of necrotic tissue within the wound bed including Adherent Slough. Wound #8 status is Open. Original cause of wound was Gradually Appeared. The wound is located on the Left,Anterior Lower Leg. The wound measures 21cm length x 9cm width x 0.1cm depth; 148.44cm^2 area and 14.844cm^3 volume. There is Fat Layer (Subcutaneous Tissue) exposed. There is no tunneling or undermining noted. There is a medium amount of serosanguineous drainage noted. The wound margin is flat and intact. There is medium (34-66%) red, friable, hyper - granulation within the wound bed. There is a medium (34-66%) amount of necrotic tissue within the wound bed including Adherent Slough. 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 There was a Chemical/Enzymatic/Mechanical debridement performed by Ricard Dillon., MD.. Other agent used was anacept and gauze. There was no bleeding. The procedure was tolerated well. Post Debridement Measurements: 1.5cm length x 2.5cm width x 0.1cm depth; 0.295cm^3 volume. Character of Wound/Ulcer Post Debridement is improved. Post procedure Diagnosis Wound #16: Same as Pre-Procedure Plan Follow-up Appointments: Return Appointment in: - 2 months Dressing Change Frequency: Change Dressing every other day. - daily as needed to drainage Skin Barriers/Peri-Wound Care: Moisturizing lotion - to both legs and feet with dressing changes Wound Cleansing: Clean wound with Wound Cleanser - all wounds Primary Wound Dressing: Wound #1 Right Hand - Dorsum: Calcium Alginate with Silver - entire right hand as well. Wound #16 Right,Proximal,Anterior Lower Leg: Calcium Alginate with Silver Wound #5 Right,Anterior Lower Leg: Calcium Alginate with Silver Wound #6 Right,Lateral,Posterior Lower Leg: Calcium Alginate with Silver Wound #8 Left,Anterior Lower Leg: Calcium Alginate with Silver Secondary Dressing: Kerlix/Rolled Gauze Dry Gauze - all wounds Edema Control: Elevate legs to the level of the heart or above for 30 minutes daily and/or when sitting, a frequency of: - throughout the day #1 I think this is a primary to dermatology issued. It is not a bacterial issue however he has had multiple courses of antibiotics. I had the idea of putting him on a prolonged course of doxycycline  and I may consider talking to his daughter  about this #2 I also think he needs to see dermatology because effectively he is being managed without a exact diagnosis here. #3 I have looked over multiple dermatology references on this man. Nothing really sets. 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. Question IgA dermatosis. Therefore I think I'll have to call his daughter about this. Although this hasn't changed in years this can't be really comfortable. It's either I treat him without a clear diagnosis or preferably send him back to dermatology at [facility]) Signed: 09/28/2019 7:26:40 PM By: [name] Ham MD Entered By: [name] [name] on 09/26/2019 16:21:59 -------------------------------------------------------------------------------- SuperBill Details [name] Name: Date of Service: [name] [name] RD 09/25/2019 Medical Record Number: [id number] [name] Account Number: [id number] Date of Birth/Sex: Treating RN: 07-29-1945 (74 y.o. [name] [name] Primary Care Provider: Theodora Blow, MIKE Other Clinician: Referring Provider: Treating Provider/Extender: [name] [name], MIKE Weeks in Treatment: 43 Diagnosis Coding ICD-10 Codes Code Description [phone] Non-pressure chronic ulcer of right calf with necrosis of muscle L97.811 Non-pressure chronic ulcer of other part of right lower leg limited to breakdown of skin S61.401D [name] open wound of right hand, subsequent encounter L89.322 Pressure ulcer of left buttock, stage 2 L97.821 Non-pressure chronic ulcer of other part of left lower leg limited to breakdown of skin Facility Procedures CPT4 Code: [id number] Description: [phone] - WOUND CARE VISIT-LEV 5 EST PT Modifier: Quantity: 1 Physician Procedures : CPT4 Code Description Modifier [id number] - WC PHYS LEVEL 3 - EST PT ICD-10 Diagnosis Description L97.213 Non-pressure chronic ulcer of right calf with  necrosis of muscle L97.811 Non-pressure chronic ulcer of other part of right lower leg limited to  breakdown of skin S61.401D [name] open wound of right hand, subsequent encounter Quantity: 1 Electronic Signature(s) Signed: 09/28/2019 7:26:40 PM By: [name] Ham MD Previous Signature: 09/25/2019 4:40:23 PM Version By: [name] [name] Entered By: [name] [name] on 09/26/2019 16:23:00

## 2019-10-06 NOTE — Progress Notes (Signed)
[name], [name] ([id number]) Visit Report for 08/07/2019 Arrival Information Details Patient Name: Date of Service: [name], [name] [name] 08/07/2019 10:45 A M Medical Record Number: [id number] Patient Account Number: [id number] Date of Birth/Sex: Treating RN: 07-25-1945 (74 y.o. [name] [name] Primary Care [name] [name]: [name] [name], [name] [name] Other Clinician: Referring Araiya Tilmon: Treating Atiyana Welte/Extender: Joanne Chars, [name] [name] Weeks in Treatment: 61 Visit Information History Since Last Visit Added or deleted any medications: No Patient Arrived: Stretcher Any new allergies or adverse reactions: No Arrival Time: 11:13 Had a fall or experienced change in No Accompanied By: self activities of daily living that may affect Transfer Assistance: None risk of falls: Patient Identification Verified: Yes Signs or symptoms of abuse/neglect since last visito No Secondary Verification Process Completed: Yes Hospitalized since last visit: No Patient Requires Transmission-Based Precautions: No Implantable device outside of the clinic excluding No Patient Has Alerts: Yes cellular tissue based products placed in the center Patient Alerts: R ABI non compressible since last visit: Has Dressing in Place as Prescribed: Yes Pain Present Now: No Electronic Signature(s) Signed: 10/06/2019 8:09:04 AM By: Sandre Kitty Entered By: Sandre Kitty on 08/07/2019 11:13:34 -------------------------------------------------------------------------------- Clinic Level of Care Assessment Details Patient Name: Date of Service: [name], RYDER [name] 08/07/2019 10:45 A M Medical Record Number: [id number] Patient Account Number: [id number] Date of Birth/Sex: Treating RN: [birth year] (74 y.o. [name] [name] Primary Care Curtiss Mahmood: [name] [name], [name] [name] Other Clinician: Referring Izic Stfort: Treating Valari Taylor/Extender: Joanne Chars, [name] [name] Weeks in Treatment: 27 Clinic Level of Care Assessment Items TOOL 4 Quantity  Score X- 1 0 Use when only an EandM is performed on FOLLOW-UP visit ASSESSMENTS - Nursing Assessment / Reassessment X- 1 10 Reassessment of Co-morbidities (includes updates in patient status) X- 1 5 Reassessment of Adherence to Treatment Plan ASSESSMENTS - Wound and Skin A ssessment / Reassessment _0  - 0 Simple Wound Assessment / Reassessment - one wound X- 8 5 Complex Wound Assessment / Reassessment - multiple wounds _1  - 0 Dermatologic / Skin Assessment (not related to wound area) ASSESSMENTS - Focused Assessment X- 2 5 Circumferential Edema Measurements - multi extremities _2  - 0 Nutritional Assessment / Counseling / Intervention _3  - 0 Lower Extremity Assessment (monofilament, tuning fork, pulses) _4  - 0 Peripheral Arterial Disease Assessment (using hand held doppler) ASSESSMENTS - Ostomy and/or Continence Assessment and Care _5  - 0 Incontinence Assessment and Management _6  - 0 Ostomy Care Assessment and Management (repouching, etc.) PROCESS - Coordination of Care X - Simple Patient / Family Education for ongoing care 1 15 _7  - 0 Complex (extensive) Patient / Family Education for ongoing care X- 1 10 Staff obtains Programmer, systems, Records, T Results / Process Orders est _8  - 0 Staff telephones HHA, Nursing Homes / Clarify orders / etc _9  - 0 Routine Transfer to another Facility (non-emergent condition) _10  - 0 Routine Hospital Admission (non-emergent condition) _11  - 0 New Admissions / Biomedical engineer / Ordering NPWT Apligraf, etc. , _12  - 0 Emergency Hospital Admission (emergent condition) X- 1 10 Simple Discharge Coordination _13  - 0 Complex (extensive) Discharge Coordination PROCESS - Special Needs _14  - 0 Pediatric / Minor Patient Management _15  - 0 Isolation Patient Management _16  - 0 Hearing / Language / Visual special needs _17  - 0 Assessment of Community assistance (transportation, D/C planning, etc.) _18  - 0 Additional assistance / Altered  mentation _19  - 0 Support Surface(s) Assessment (bed, cushion, seat, etc.) INTERVENTIONS - Wound Cleansing / Measurement _20  - 0 Simple Wound Cleansing - one wound X- 8 5 Complex  Wound Cleansing - multiple wounds X- 1 5 Wound Imaging (photographs - any number of wounds) _0  - 0 Wound Tracing (instead of photographs) _1  - 0 Simple Wound Measurement - one wound X- 8 5 Complex Wound Measurement - multiple wounds INTERVENTIONS - Wound Dressings _2  - 0 Small Wound Dressing one or multiple wounds X- 8 15 Medium Wound Dressing one or multiple wounds _3  - 0 Large Wound Dressing one or multiple wounds X- 1 5 Application of Medications - topical <[id number]>_4<\/[id number]>_8  - 0 Application of Medications - injection INTERVENTIONS - Miscellaneous _5  - 0 External ear exam _6  - 0 Specimen Collection (cultures, biopsies, blood, body fluids, etc.) _7  - 0 Specimen(s) / Culture(s) sent or taken to Lab for analysis _8  - 0 Patient Transfer (multiple staff / Civil Service fast streamer / Similar devices) _9  - 0 Simple Staple / Suture removal (25 or less) _10  - 0 Complex Staple / Suture removal (26 or more) _11  - 0 Hypo / Hyperglycemic Management (close monitor of Blood Glucose) _12  - 0 Ankle / Brachial Index (ABI) - do not check if billed separately X- 1 5 Vital Signs Has the patient been seen at the hospital within the last three years: Yes Total Score: 315 Level Of Care: New/Established - Level 5 Electronic Signature(s) Signed: 08/07/2019 5:51:32 PM By: Kela Millin Entered By: Kela Millin on 08/07/2019 12:16:23 -------------------------------------------------------------------------------- Encounter Discharge Information Details Patient Name: Date of Service: [name] [name] [name] 08/07/2019 10:45 A M Medical Record Number: [id number] Patient Account Number: [id number] Date of Birth/Sex: Treating RN: 11/29/1945 (74 y.o. [name] Primary Care Genifer Lazenby: [name] [name], [name] [name] Other Clinician: Referring  Sunny Gains: Treating Winner Valeriano/Extender: Joanne Chars, [name] [name] Weeks in Treatment: 70 Encounter Discharge Information Items Discharge Condition: Stable Ambulatory Status: Stretcher Discharge Destination: Home Transportation: Ambulance Accompanied By: EMS staff Schedule Follow-up Appointment: Yes Clinical Summary of Care: Patient Declined Electronic Signature(s) Signed: 08/07/2019 5:49:13 PM By: Baruch Gouty RN, BSN Entered By: Baruch Gouty on 08/07/2019 12:34:12 -------------------------------------------------------------------------------- Lower Extremity Assessment Details Patient Name: Date of Service: ORRIN, YURKOVICH [name] 08/07/2019 10:45 A M Medical Record Number: [id number] Patient Account Number: [id number] Date of Birth/Sex: Treating RN: February 11, 1945 (74 y.o. [name] Primary Care Camren Henthorn: [name] [name], [name] [name] Other Clinician: Referring Jaymen Fetch: Treating Katie Moch/Extender: Joanne Chars, [name] [name] Weeks in Treatment: 63 Edema Assessment Assessed: [Left: No] [Right: No] Edema: [Left: Yes] [Right: Yes] Calf Left: Right: Point of Measurement: cm From Medial Instep 37 cm 37.4 cm Ankle Left: Right: Point of Measurement: cm From Medial Instep 17.5 cm 17.8 cm Vascular Assessment Pulses: Dorsalis Pedis Palpable: [Left:Yes] [Right:Yes] Electronic Signature(s) Signed: 08/07/2019 5:49:13 PM By: Baruch Gouty RN, BSN Entered By: Baruch Gouty on 08/07/2019 11:51:14 -------------------------------------------------------------------------------- Multi Wound Chart Details Patient Name: Date of Service: [name] [name] [name] 08/07/2019 10:45 A M Medical Record Number: [id number] Patient Account Number: [id number] Date of Birth/Sex: Treating RN: [birth year] (74 y.o. [name] [name] Primary Care Phillips Goulette: [name] [name], [name] [name] Other Clinician: Referring Ashonti Leandro: Treating Tawyna Pellot/Extender: Joanne Chars, [name] [name] Weeks in Treatment: 62 Vital  Signs Height(in): 67 Pulse(bpm): 34 Weight(lbs): 130 Blood Pressure(mmHg): 171/98 Body Mass Index(BMI): 20 Temperature(F): 98.2 Respiratory Rate(breaths/min): 16 Photos: [1:No Photos Right Hand - Dorsum] [14:No Photos Left, Proximal, Anterior Lower Leg] [15:No Photos Left, Lateral Lower Leg] Wound Location: [1:Gradually Appeared] [14:Gradually Appeared] [15:Gradually Appeared] Wounding Event: [1:Atypical] [14:Atypical] [15:Atypical] Primary Etiology: [1:Anemia, Hypertension, Type II] [14:Anemia, Hypertension, Type II] [15:Anemia, Hypertension, Type II] Comorbid History: [1:Diabetes, Paraplegia 11/15/2017] [14:Diabetes, Paraplegia 05/20/2019] [15:Diabetes, Paraplegia 07/10/2019] Date Acquired: [1:83] [14:11] [  15:4] Weeks of Treatment: [1:Open] [14:Open] [15:Open] Wound Status: [1:Yes] [14:Yes] [15:Yes] Clustered Wound: [1:4] [14:2] [15:2] Clustered Quantity: [1:14x24x0.1] [14:1.5x2.5x0.1] [15:3.7x1.5x0.1] Measurements L x W x D (cm) [1:263.894] [14:2.945] [15:4.359] A (cm) : rea [1:26.389] [14:0.295] [15:0.436] Volume (cm) : [1:-727.60%] [14:-4047.90%] [15:30.60%] % Reduction in Area: [1:-727.50%] [14:-4114.30%] [15:30.60%] % Reduction in Volume: [1:Full Thickness Without Exposed] [14:Full Thickness Without Exposed] [15:Full Thickness Without Exposed] Classification: [1:Support Structures Large] [14:Support Structures Medium] [15:Support Structures Medium] Exudate Amount: [1:Purulent] [14:Serosanguineous] [15:Serosanguineous] Exudate Type: [1:yellow, brown, green] [14:red, brown] [15:red, brown] Exudate Color: [1:Indistinct, nonvisible] [14:Distinct, outline attached] [15:Flat and Intact] Wound Margin: [1:Large (67-100%)] [14:Large (67-100%)] [15:Large (67-100%)] Granulation Amount: [1:Red] [14:Red, Hyper-granulation] [15:Red, Hyper-granulation, Friable] Granulation Quality: [1:Small (1-33%)] [14:None Present (0%)] [15:None Present (0%)] Necrotic Amount: [1:Fat Layer (Subcutaneous  Tissue): Yes Fat Layer (Subcutaneous Tissue): Yes Fat Layer (Subcutaneous Tissue): Yes] Exposed Structures: [1:Fascia: No Tendon: No Muscle: No Joint: No Bone: No Medium (34-66%)] [14:Fascia: No Tendon: No Muscle: No Joint: No Bone: No Medium (34-66%)] [15:Fascia: No Tendon: No Muscle: No Joint: No Bone: No Small (1-33%)] Wound Number: _0 Photos: No Photos No Photos No Photos Right, Proximal, Anterior Lower Leg Left, Proximal, Lateral Lower Leg Right, Anterior Lower Leg Wound Location: Gradually Appeared Gradually Appeared Gradually Appeared 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 Primary Etiology: Extremity Anemia, Hypertension, Type II Anemia, Hypertension, Type II Anemia, Hypertension, Type II Comorbid History: Diabetes, Paraplegia Diabetes, Paraplegia Diabetes, Paraplegia 07/10/2019 08/07/2019 11/15/2017 Date Acquired: 4 0 83 Weeks of Treatment: Open Open Open Wound Status: No No Yes Clustered Wound: N/A N/A 2 Clustered Quantity: 0.7x0.7x0.1 2x1.4x0.1 5.3x3.2x0.2 Measurements L x W x D (cm) 0.385 2.199 13.32 A (cm) : rea 0.038 0.22 2.664 Volume (cm) : 30.00% 0.00% 88.50% % Reduction in Area: 30.90% 0.00% 76.90% % Reduction in Volume: Full Thickness Without Exposed Full Thickness Without Exposed Grade 2 Classification: Support Structures Support Structures Medium Medium Medium Exudate Amount: Serosanguineous Serosanguineous Serosanguineous Exudate Type: red, brown red, brown red, brown Exudate Color: Indistinct, nonvisible Indistinct, nonvisible Flat and Intact Wound Margin: Large (67-100%) Large (67-100%) Large (67-100%) Granulation Amount: Red, Pink, Hyper-granulation, Friable Red, Hyper-granulation Red, Hyper-granulation Granulation Quality: None Present (0%) None Present (0%) None Present (0%) Necrotic Amount: Fat Layer (Subcutaneous Tissue): Yes Fat Layer (Subcutaneous Tissue): Yes Fat Layer (Subcutaneous Tissue):  Yes Exposed Structures: Fascia: No Fascia: No Fascia: No Tendon: No Tendon: No Tendon: No Muscle: No Muscle: No Muscle: No Joint: No Joint: No Joint: No Bone: No Bone: No Bone: No Small (1-33%) Medium (34-66%) Medium (34-66%) Epithelialization: Wound Number: 6 8 N/A Photos: No Photos No Photos N/A Right, Lateral, Posterior Lower Leg Left, Anterior Lower Leg N/A Wound Location: Gradually Appeared Gradually Appeared N/A Wounding Event: 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 N/A Primary Etiology: Extremity Extremity Anemia, Hypertension, Type II Anemia, Hypertension, Type II N/A Comorbid History: Diabetes, Paraplegia Diabetes, Paraplegia 11/15/2017 11/15/2017 N/A Date Acquired: 83 83 N/A Weeks of Treatment: Open Open N/A Wound Status: Yes Yes N/A Clustered Wound: 1 9 N/A Clustered Quantity: 13x3.5x0.1 16x5x0.1 N/A Measurements L x W x D (cm) 35.736 62.832 N/A A (cm) : rea 3.574 6.283 N/A Volume (cm) : -32.10% -208.20% N/A % Reduction in A rea: -32.10% -208.10% N/A % Reduction in Volume: Grade 2 Grade 2 N/A Classification: Medium Medium N/A Exudate A mount: Serosanguineous Serosanguineous N/A Exudate Type: red, brown red, brown N/A Exudate Color: Flat and Intact Flat and Intact N/A Wound Margin: Large (67-100%)  Large (67-100%) N/A Granulation A mount: Red, Friable Red, Hyper-granulation, Friable N/A Granulation Quality: None Present (0%) None Present (0%) N/A Necrotic A mount: Fat Layer (Subcutaneous Tissue): Yes Fat Layer (Subcutaneous Tissue): Yes N/A Exposed Structures: Fascia: No Fascia: No Tendon: No Tendon: No Muscle: No Muscle: No Joint: No Joint: No Bone: No Bone: No Medium (34-66%) Large (67-100%) N/A Epithelialization: Treatment Notes Wound #1 (Right Hand - Dorsum) 3. Primary Dressing Applied Calcium Alginate Ag 4. Secondary Dressing ABD Pad Dry Gauze Roll Gauze 7. Footwear/Offloading device  applied Multipodus Splint/Boot Wound #14 (Left, Proximal, Anterior Lower Leg) 3. Primary Dressing Applied Calcium Alginate Ag 4. Secondary Dressing ABD Pad Dry Gauze Roll Gauze 7. Footwear/Offloading device applied Multipodus Splint/Boot Wound #15 (Left, Lateral Lower Leg) 3. Primary Dressing Applied Calcium Alginate Ag 4. Secondary Dressing ABD Pad Dry Gauze Roll Gauze 7. Footwear/Offloading device applied Multipodus Splint/Boot Wound #16 (Right, Proximal, Anterior Lower Leg) 3. Primary Dressing Applied Calcium Alginate Ag 4. Secondary Dressing ABD Pad Dry Gauze Roll Gauze 7. Footwear/Offloading device applied Multipodus Splint/Boot Wound #17 (Left, Proximal, Lateral Lower Leg) 3. Primary Dressing Applied Calcium Alginate Ag 4. Secondary Dressing ABD Pad Dry Gauze Roll Gauze 7. Footwear/Offloading device applied Multipodus Splint/Boot Wound #5 (Right, Anterior Lower Leg) 3. Primary Dressing Applied Calcium Alginate Ag 4. Secondary Dressing ABD Pad Dry Gauze Roll Gauze 7. Footwear/Offloading device applied Multipodus Splint/Boot Wound #6 (Right, Lateral, Posterior Lower Leg) 3. Primary Dressing Applied Calcium Alginate Ag 4. Secondary Dressing ABD Pad Dry Gauze Roll Gauze 7. Footwear/Offloading device applied Multipodus Splint/Boot Wound #8 (Left, Anterior Lower Leg) 3. Primary Dressing Applied Calcium Alginate Ag 4. Secondary Dressing ABD Pad Dry Gauze Roll Gauze 7. Footwear/Offloading device applied Multipodus Splint/Boot Electronic Signature(s) Signed: 08/07/2019 5:20:32 PM By: Linton Ham MD Signed: 08/07/2019 5:51:32 PM By: Kela Millin Entered By: Linton Ham on 08/07/2019 12:43:41 -------------------------------------------------------------------------------- Multi-Disciplinary Care Plan Details Patient Name: Date of Service: [name], CARROLL [name] 08/07/2019 10:45 A M Medical Record Number: [id number] Patient Account Number:  [id number] Date of Birth/Sex: Treating RN: 01/05/1946 (74 y.o. [name] [name] Primary Care Leeana Creer: [name] [name], [name] [name] Other Clinician: Referring Dare Spillman: Treating Miaa Latterell/Extender: Joanne Chars, [name] [name] Weeks in Treatment: 19 Active Inactive Abuse / Safety / Falls / Self Care Management Nursing Diagnoses: Impaired physical mobility Potential for injury related to transfers Goals: Patient will not develop complications from immobility Date Initiated: 12/31/2017 Date Inactivated: 02/10/2018 Target Resolution Date: 01/31/2018 Goal Status: Met Patient/caregiver will verbalize understanding of skin care regimen Date Initiated: 12/31/2017 Target Resolution Date: 09/18/2019 Goal Status: Active Patient/caregiver will verbalize/demonstrate measures taken to prevent injury and/or falls Date Initiated: 12/31/2017 Date Inactivated: 02/10/2018 Target Resolution Date: 01/31/2018 Goal Status: Met Interventions: Assess Activities of Daily Living upon admission and as needed Assess fall risk on admission and as needed Assess: immobility, friction, shearing, incontinence upon admission and as needed Assess impairment of mobility on admission and as needed per policy Assess personal safety and home safety (as indicated) on admission and as needed Assess self care needs on admission and as needed Notes: Wound/Skin Impairment Nursing Diagnoses: Knowledge deficit related to ulceration/compromised skin integrity Goals: Patient/caregiver will verbalize understanding of skin care regimen Date Initiated: 05/20/2019 Target Resolution Date: 09/18/2019 Goal Status: Active Ulcer/skin breakdown will have a volume reduction of 30% by week 4 Date Initiated: 12/31/2017 Date Inactivated: 05/20/2019 Target Resolution Date: 06/18/2018 Goal Status: Met Interventions: Assess patient/caregiver ability to obtain necessary supplies Assess patient/caregiver ability to perform ulcer/skin care regimen upon  admission and as needed  Assess ulceration(s) every visit Notes: Electronic Signature(s) Signed: 08/07/2019 5:51:32 PM By: Kela Millin Entered By: Kela Millin on 08/07/2019 12:14:52 -------------------------------------------------------------------------------- Pain Assessment Details Patient Name: Date of Service: [name], RENWICK [name] 08/07/2019 10:45 A M Medical Record Number: [id number] Patient Account Number: [id number] Date of Birth/Sex: Treating RN: [birth year] (74 y.o. [name] [name] Primary Care Lynasia Meloche: [name] [name], [name] [name] Other Clinician: Referring Alban Marucci: Treating [place] Yearby/Extender: Joanne Chars, [name] [name] Weeks in Treatment: 24 Active Problems Location of Pain Severity and Description of Pain Patient Has Paino No Site Locations Pain Management and Medication Current Pain Management: Electronic Signature(s) Signed: 08/07/2019 5:51:32 PM By: Kela Millin Signed: 10/06/2019 8:09:04 AM By: Sandre Kitty Entered By: Sandre Kitty on 08/07/2019 11:13:57 -------------------------------------------------------------------------------- Patient/Caregiver Education Details Patient Name: Date of Service: [name] [name] [name] 7/23/2021andnbsp10:45 A M Medical Record Number: [id number] Patient Account Number: [id number] Date of Birth/Gender: Treating RN: 11/03/1945 (74 y.o. [name] [name] Primary Care Physician: [name] [name], [name] [name] Other Clinician: Referring Physician: Treating Physician/Extender: Joanne Chars, [name] [name] Weeks in Treatment: 36 Education Assessment Education Provided To: Patient Education Topics Provided Wound/Skin Impairment: Handouts: Caring for Your Ulcer Methods: Explain/Verbal Responses: State content correctly Electronic Signature(s) Signed: 08/07/2019 5:51:32 PM By: Kela Millin Entered By: Kela Millin on 08/07/2019  12:15:08 -------------------------------------------------------------------------------- Wound Assessment Details Patient Name: Date of Service: [name], JERNIGAN [name] 08/07/2019 10:45 A M Medical Record Number: [id number] Patient Account Number: [id number] Date of Birth/Sex: Treating RN: Feb 25, 1945 (74 y.o. [name] [name] Primary Care Ruble Pumphrey: [name] [name], [name] [name] Other Clinician: Referring Keneisha Heckart: Treating Long Brimage/Extender: Joanne Chars, [name] [name] Weeks in Treatment: 30 Wound Status Wound Number: 1 Primary Etiology: Atypical Wound Location: Right Hand - Dorsum Wound Status: Open Wounding Event: Gradually Appeared Comorbid History: Anemia, Hypertension, Type II Diabetes, Paraplegia Date Acquired: 11/15/2017 Weeks Of Treatment: 83 Clustered Wound: Yes Photos Photo Uploaded By: Mikeal Hawthorne on 08/07/2019 15:30:54 Wound Measurements Length: (cm) 14 Width: (cm) 24 Depth: (cm) 0.1 Clustered Quantity: 4 Area: (cm) 263.894 Volume: (cm) 26.389 % Reduction in Area: -727.6% % Reduction in Volume: -727.5% Epithelialization: Medium (34-66%) Tunneling: No Undermining: No Wound Description Classification: Full Thickness Without Exposed Support Structures Wound Margin: Indistinct, nonvisible Exudate Amount: Large Exudate Type: Purulent Exudate Color: yellow, brown, green Foul Odor After Cleansing: No Slough/Fibrino Yes Wound Bed Granulation Amount: Large (67-100%) Exposed Structure Granulation Quality: Red Fascia Exposed: No Necrotic Amount: Small (1-33%) Fat Layer (Subcutaneous Tissue) Exposed: Yes Necrotic Quality: Adherent Slough Tendon Exposed: No Muscle Exposed: No Joint Exposed: No Bone Exposed: No Electronic Signature(s) Signed: 08/07/2019 5:49:13 PM By: Baruch Gouty RN, BSN Signed: 08/07/2019 5:51:32 PM By: Kela Millin Entered By: Baruch Gouty on 08/07/2019  11:52:07 -------------------------------------------------------------------------------- Wound Assessment Details Patient Name: Date of Service: [name] [name] [name] 08/07/2019 10:45 A M Medical Record Number: [id number] Patient Account Number: [id number] Date of Birth/Sex: Treating RN: [birth year] (74 y.o. [name] [name] Primary Care Auri Jahnke: [name] [name], [name] [name] Other Clinician: Referring Etty Isaac: Treating Jenney Brester/Extender: Joanne Chars, [name] [name] Weeks in Treatment: 3 Wound Status Wound Number: 14 Primary Etiology: Atypical Wound Location: Left, Proximal, Anterior Lower Leg Wound Status: Open Wounding Event: Gradually Appeared Comorbid History: Anemia, Hypertension, Type II Diabetes, Paraplegia Date Acquired: 05/20/2019 Weeks Of Treatment: 11 Clustered Wound: Yes Photos Photo Uploaded By: Mikeal Hawthorne on 08/07/2019 15:29:27 Wound Measurements Length: (cm) Width: (cm) Depth: (cm) Clustered Quantity: Area: (cm) Volume: (cm) 1.5 % Reduction in Area: -4047.9% 2.5 % Reduction in Volume: -4114.3% 0.1 Epithelialization: Medium (34-66%) 2 Tunneling: No 2.945 Undermining: No 0.295 Wound Description Classification:  Full Thickness Without Exposed Support Structures Wound Margin: Distinct, outline attached Exudate Amount: Medium Exudate Type: Serosanguineous Exudate Color: red, brown Foul Odor After Cleansing: No Slough/Fibrino No Wound Bed Granulation Amount: Large (67-100%) Exposed Structure Granulation Quality: Red, Hyper-granulation Fascia Exposed: No Necrotic Amount: None Present (0%) Fat Layer (Subcutaneous Tissue) Exposed: Yes Tendon Exposed: No Muscle Exposed: No Joint Exposed: No Bone Exposed: No Electronic Signature(s) Signed: 08/07/2019 5:49:13 PM By: Baruch Gouty RN, BSN Signed: 08/07/2019 5:51:32 PM By: Kela Millin Entered By: Baruch Gouty on 08/07/2019  11:52:38 -------------------------------------------------------------------------------- Wound Assessment Details Patient Name: Date of Service: [name] [name] [name] 08/07/2019 10:45 A M Medical Record Number: [id number] Patient Account Number: [id number] Date of Birth/Sex: Treating RN: August 28, 1945 (74 y.o. [name] [name] Primary Care Lynnsie Linders: [name] [name], [name] [name] Other Clinician: Referring Colie Josten: Treating Ellouise Mcwhirter/Extender: Joanne Chars, [name] [name] Weeks in Treatment: 55 Wound Status Wound Number: 15 Primary Etiology: Atypical Wound Location: Left, Lateral Lower Leg Wound Status: Open Wounding Event: Gradually Appeared Comorbid History: Anemia, Hypertension, Type II Diabetes, Paraplegia Date Acquired: 07/10/2019 Weeks Of Treatment: 4 Clustered Wound: Yes Photos Photo Uploaded By: Mikeal Hawthorne on 08/07/2019 15:29:27 Wound Measurements Length: (cm) 3.7 Width: (cm) 1.5 Depth: (cm) 0.1 Clustered Quantity: 2 Area: (cm) 4.359 Volume: (cm) 0.436 % Reduction in Area: 30.6% % Reduction in Volume: 30.6% Epithelialization: Small (1-33%) Tunneling: No Undermining: No Wound Description Classification: Full Thickness Without Exposed Support Structures Wound Margin: Flat and Intact Exudate Amount: Medium Exudate Type: Serosanguineous Exudate Color: red, brown Foul Odor After Cleansing: No Slough/Fibrino No Wound Bed Granulation Amount: Large (67-100%) Exposed Structure Granulation Quality: Red, Hyper-granulation, Friable Fascia Exposed: No Necrotic Amount: None Present (0%) Fat Layer (Subcutaneous Tissue) Exposed: Yes Tendon Exposed: No Muscle Exposed: No Joint Exposed: No Bone Exposed: No Electronic Signature(s) Signed: 08/07/2019 5:49:13 PM By: Baruch Gouty RN, BSN Signed: 08/07/2019 5:51:32 PM By: Kela Millin Entered By: Baruch Gouty on 08/07/2019 11:53:13 -------------------------------------------------------------------------------- Wound  Assessment Details Patient Name: Date of Service: [name] [name] [name] 08/07/2019 10:45 A M Medical Record Number: [id number] Patient Account Number: [id number] Date of Birth/Sex: Treating RN: February 15, 1945 (74 y.o. [name] [name] Primary Care Amory Simonetti: [name] [name], [name] [name] Other Clinician: Referring Daymian Lill: Treating Jazara Swiney/Extender: Joanne Chars, [name] [name] Weeks in Treatment: 37 Wound Status Wound Number: 16 Primary Etiology: Atypical Wound Location: Right, Proximal, Anterior Lower Leg Wound Status: Open Wounding Event: Gradually Appeared Comorbid History: Anemia, Hypertension, Type II Diabetes, Paraplegia Date Acquired: 07/10/2019 Weeks Of Treatment: 4 Clustered Wound: No Photos Photo Uploaded By: Mikeal Hawthorne on 08/07/2019 14:05:11 Wound Measurements Length: (cm) 0.7 Width: (cm) 0.7 Depth: (cm) 0.1 Area: (cm) 0.385 Volume: (cm) 0.038 % Reduction in Area: 30% % Reduction in Volume: 30.9% Epithelialization: Small (1-33%) Tunneling: No Undermining: No Wound Description Classification: Full Thickness Without Exposed Support Structures Wound Margin: Indistinct, nonvisible Exudate Amount: Medium Exudate Type: Serosanguineous Exudate Color: red, brown Foul Odor After Cleansing: No Slough/Fibrino No Wound Bed Granulation Amount: Large (67-100%) Exposed Structure Granulation Quality: Red, Pink, Hyper-granulation, Friable Fascia Exposed: No Necrotic Amount: None Present (0%) Fat Layer (Subcutaneous Tissue) Exposed: Yes Tendon Exposed: No Muscle Exposed: No Joint Exposed: No Bone Exposed: No Electronic Signature(s) Signed: 08/07/2019 5:49:13 PM By: Baruch Gouty RN, BSN Signed: 08/07/2019 5:51:32 PM By: Kela Millin Entered By: Baruch Gouty on 08/07/2019 11:53:53 -------------------------------------------------------------------------------- Wound Assessment Details Patient Name: Date of Service: [name] [name] [name] 08/07/2019 10:45 A M Medical Record  Number: [id number] Patient Account Number: [id number] Date of Birth/Sex: Treating RN: [birth year] (74 y.o. [name] [name] Primary  Care Lorrinda Ramstad: [name] [name], [name] [name] Other Clinician: Referring Gill Delrossi: Treating Tadashi Burkel/Extender: Joanne Chars, [name] [name] Weeks in Treatment: 42 Wound Status Wound Number: 17 Primary Etiology: Venous Leg Ulcer Wound Location: Left, Proximal, Lateral Lower Leg Wound Status: Open Wounding Event: Gradually Appeared Comorbid History: Anemia, Hypertension, Type II Diabetes, Paraplegia Date Acquired: 08/07/2019 Weeks Of Treatment: 0 Clustered Wound: No Photos Photo Uploaded By: Mikeal Hawthorne on 08/07/2019 15:30:00 Wound Measurements Length: (cm) 2 Width: (cm) 1.4 Depth: (cm) 0.1 Area: (cm) 2.199 Volume: (cm) 0.22 % Reduction in Area: 0% % Reduction in Volume: 0% Epithelialization: Medium (34-66%) Tunneling: No Undermining: No Wound Description Classification: Full Thickness Without Exposed Support Str Wound Margin: Indistinct, nonvisible Exudate Amount: Medium Exudate Type: Serosanguineous Exudate Color: red, brown uctures Wound Bed Granulation Amount: Large (67-100%) Exposed Structure Granulation Quality: Red, Hyper-granulation Fascia Exposed: No Necrotic Amount: None Present (0%) Fat Layer (Subcutaneous Tissue) Exposed: Yes Tendon Exposed: No Muscle Exposed: No Joint Exposed: No Bone Exposed: No Electronic Signature(s) Signed: 08/07/2019 5:49:13 PM By: Baruch Gouty RN, BSN Signed: 08/07/2019 5:51:32 PM By: Kela Millin Entered By: Baruch Gouty on 08/07/2019 11:54:35 -------------------------------------------------------------------------------- Wound Assessment Details Patient Name: Date of Service: [name] [name] [name] 08/07/2019 10:45 A M Medical Record Number: [id number] Patient Account Number: [id number] Date of Birth/Sex: Treating RN: 10/21/1945 (74 y.o. [name] [name] Primary Care Kaveon Blatz: [name] [name],  [name] [name] Other Clinician: Referring Zong Mcquarrie: Treating [name]/Extender: Joanne Chars, [name] [name] Weeks in Treatment: 56 Wound Status Wound Number: 5 Primary Etiology: Diabetic Wound/Ulcer of the Lower Extremity Wound Location: Right, Anterior Lower Leg Wound Status: Open Wounding Event: Gradually Appeared Comorbid History: Anemia, Hypertension, Type II Diabetes, Paraplegia Date Acquired: 11/15/2017 Weeks Of Treatment: 83 Clustered Wound: Yes Photos Photo Uploaded By: Mikeal Hawthorne on 08/07/2019 15:28:59 Wound Measurements Length: (cm) 5.3 Width: (cm) 3.2 Depth: (cm) 0.2 Clustered Quantity: 2 Area: (cm) 13.32 Volume: (cm) 2.664 % Reduction in Area: 88.5% % Reduction in Volume: 76.9% Epithelialization: Medium (34-66%) Tunneling: No Undermining: No Wound Description Classification: Grade 2 Wound Margin: Flat and Intact Exudate Amount: Medium Exudate Type: Serosanguineous Exudate Color: red, brown Foul Odor After Cleansing: No Slough/Fibrino No Wound Bed Granulation Amount: Large (67-100%) Exposed Structure Granulation Quality: Red, Hyper-granulation Fascia Exposed: No Necrotic Amount: None Present (0%) Fat Layer (Subcutaneous Tissue) Exposed: Yes Tendon Exposed: No Muscle Exposed: No Joint Exposed: No Bone Exposed: No Electronic Signature(s) Signed: 08/07/2019 5:49:13 PM By: Baruch Gouty RN, BSN Signed: 08/07/2019 5:51:32 PM By: Kela Millin Entered By: Baruch Gouty on 08/07/2019 11:55:06 -------------------------------------------------------------------------------- Wound Assessment Details Patient Name: Date of Service: [name] [name] [name] 08/07/2019 10:45 A M Medical Record Number: [id number] Patient Account Number: [id number] Date of Birth/Sex: Treating RN: 30-Jan-1945 (74 y.o. [name] [name] Primary Care Obdulia Steier: [name] [name], [name] [name] Other Clinician: Referring Mirra Basilio: Treating Mattthew Ziomek/Extender: Joanne Chars, [name] [name] Weeks in  Treatment: 83 Wound Status Wound Number: 6 Primary Etiology: Diabetic Wound/Ulcer of the Lower Extremity Wound Location: Right, Lateral, Posterior Lower Leg Wound Status: Open Wounding Event: Gradually Appeared Comorbid History: Anemia, Hypertension, Type II Diabetes, Paraplegia Date Acquired: 11/15/2017 Weeks Of Treatment: 83 Clustered Wound: Yes Photos Photo Uploaded By: Mikeal Hawthorne on 08/07/2019 14:05:12 Wound Measurements Length: (cm) 13 Width: (cm) 3.5 Depth: (cm) 0.1 Clustered Quantity: 1 Area: (cm) 35.736 Volume: (cm) 3.574 % Reduction in Area: -32.1% % Reduction in Volume: -32.1% Epithelialization: Medium (34-66%) Tunneling: No Undermining: No Wound Description Classification: Grade 2 Wound Margin: Flat and Intact Exudate Amount: Medium Exudate Type: Serosanguineous Exudate Color: red, brown Foul Odor After  Cleansing: No Slough/Fibrino No Wound Bed Granulation Amount: Large (67-100%) Exposed Structure Granulation Quality: Red, Friable Fascia Exposed: No Necrotic Amount: None Present (0%) Fat Layer (Subcutaneous Tissue) Exposed: Yes Tendon Exposed: No Muscle Exposed: No Joint Exposed: No Bone Exposed: No Electronic Signature(s) Signed: 08/07/2019 5:49:13 PM By: Baruch Gouty RN, BSN Signed: 08/07/2019 5:51:32 PM By: Kela Millin Entered By: Baruch Gouty on 08/07/2019 11:55:57 -------------------------------------------------------------------------------- Wound Assessment Details Patient Name: Date of Service: [name] [name] [name] 08/07/2019 10:45 A M Medical Record Number: [id number] Patient Account Number: [id number] Date of Birth/Sex: Treating RN: [birth year] (74 y.o. [name] [name] Primary Care Amilee Janvier: [name] [name], [name] [name] Other Clinician: Referring Sera Hitsman: Treating Sedonia Kitner/Extender: Joanne Chars, [name] [name] Weeks in Treatment: 24 Wound Status Wound Number: 8 Primary Etiology: Diabetic Wound/Ulcer of the Lower Extremity Wound  Location: Left, Anterior Lower Leg Wound Status: Open Wounding Event: Gradually Appeared Comorbid History: Anemia, Hypertension, Type II Diabetes, Paraplegia Date Acquired: 11/15/2017 Weeks Of Treatment: 83 Clustered Wound: Yes Photos Photo Uploaded By: Mikeal Hawthorne on 08/07/2019 15:30:01 Wound Measurements Length: (cm) 16 Width: (cm) 5 Depth: (cm) 0.1 Clustered Quantity: 9 Area: (cm) 62.832 Volume: (cm) 6.283 % Reduction in Area: -208.2% % Reduction in Volume: -208.1% Epithelialization: Large (67-100%) Tunneling: No Undermining: No Wound Description Classification: Grade 2 Wound Margin: Flat and Intact Exudate Amount: Medium Exudate Type: Serosanguineous Exudate Color: red, brown Foul Odor After Cleansing: No Slough/Fibrino No Wound Bed Granulation Amount: Large (67-100%) Exposed Structure Granulation Quality: Red, Hyper-granulation, Friable Fascia Exposed: No Necrotic Amount: None Present (0%) Fat Layer (Subcutaneous Tissue) Exposed: Yes Tendon Exposed: No Muscle Exposed: No Joint Exposed: No Bone Exposed: No Electronic Signature(s) Signed: 08/07/2019 5:49:13 PM By: Baruch Gouty RN, BSN Signed: 08/07/2019 5:51:32 PM By: Kela Millin Entered By: Baruch Gouty on 08/07/2019 11:56:43 -------------------------------------------------------------------------------- Vitals Details Patient Name: Date of Service: [name] [name] [name] 08/07/2019 10:45 A M Medical Record Number: [id number] Patient Account Number: [id number] Date of Birth/Sex: Treating RN: [birth year] (74 y.o. [name] [name] Primary Care Verma Grothaus: [name] [name], [name] [name] Other Clinician: Referring Hassen Bruun: Treating Mayce Noyes/Extender: Joanne Chars, [name] [name] Weeks in Treatment: 12 Vital Signs Time Taken: 11:13 Temperature (F): 98.2 Height (in): 67 Pulse (bpm): 76 Weight (lbs): 130 Respiratory Rate (breaths/min): 16 Body Mass Index (BMI): 20.4 Blood Pressure (mmHg): 171/98 Reference  Range: 80 - 120 mg / dl Electronic Signature(s) Signed: 10/06/2019 8:09:04 AM By: Sandre Kitty Entered By: Sandre Kitty on 08/07/2019 11:13:51

## 2019-11-20 ENCOUNTER — Encounter (HOSPITAL_BASED_OUTPATIENT_CLINIC_OR_DEPARTMENT_OTHER): Payer: Medicare Other | Attending: Internal Medicine | Admitting: Internal Medicine

## 2019-11-20 ENCOUNTER — Other Ambulatory Visit: Payer: Self-pay

## 2019-11-20 DIAGNOSIS — S61401D Unspecified open wound of right hand, subsequent encounter: Secondary | ICD-10-CM | POA: Insufficient documentation

## 2019-11-20 DIAGNOSIS — L97821 Non-pressure chronic ulcer of other part of left lower leg limited to breakdown of skin: Secondary | ICD-10-CM | POA: Diagnosis not present

## 2019-11-20 DIAGNOSIS — E11622 Type 2 diabetes mellitus with other skin ulcer: Secondary | ICD-10-CM | POA: Insufficient documentation

## 2019-11-20 DIAGNOSIS — X58XXXD Exposure to other specified factors, subsequent encounter: Secondary | ICD-10-CM | POA: Diagnosis not present

## 2019-11-20 DIAGNOSIS — L97811 Non-pressure chronic ulcer of other part of right lower leg limited to breakdown of skin: Secondary | ICD-10-CM | POA: Insufficient documentation

## 2019-11-20 DIAGNOSIS — L89322 Pressure ulcer of left buttock, stage 2: Secondary | ICD-10-CM | POA: Insufficient documentation

## 2019-11-20 DIAGNOSIS — L97213 Non-pressure chronic ulcer of right calf with necrosis of muscle: Secondary | ICD-10-CM | POA: Insufficient documentation

## 2019-11-20 DIAGNOSIS — Z8673 Personal history of transient ischemic attack (TIA), and cerebral infarction without residual deficits: Secondary | ICD-10-CM | POA: Diagnosis not present

## 2019-11-23 NOTE — Progress Notes (Signed)
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 a bit better. He has bilateral new open areas on his lower extremities which are the same blistered small areas that we have seen previously. 7/23; the patient arrives back in clinic today for his monthly palliative follow-up. He has an undiagnosed skin edition involving the dorsal aspect of both legs over the tibia and the dorsal aspect of his hand extending into the wrist on the right. All of these have a similar configuration. There are open punched out wounds but I really believe these start as 10 subdermal blisters. In 2020 I sent him to see dermatology at [facility]. They talked about pustules and erosions at that time. They thought some of these were infectious and put him on Keflex. Since then he has been on different courses of antibiotics including in June of this year I gave him a course of Levaquin. He was in the ER later in June and was given Keflex and doxycycline. 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 I really do not believe these are primary bacterial/infectious issues. Although the lesions look like erosions when they are new they have almost hyper granular appearance but underneath this there is fluid. This is what is led me to call these blisters 9/10; this is a patient I follow in clinic on a palliative basis. He has an unusual skin condition involving the dorsal aspect of his hand and wrist and the tibial part of both lower extremities. His lesions are raised hypertrophic granular initially with central fluid which eventually form into erosions they have never evolved into more widespread than the areas described. He is effectively quadriparetic. He has been on multiple courses of antibiotics for this without any effect. His daughter used to accompany him but I think she works now. I would like to send him back to dermatology at [facility] and saw him sometime in 2020. 11/5; No major change. Extensive lesions on both legs and right hand and writs. Many of these are shallow ulcers with crusty borders. Large deep blisters on his legs. Palliative dressing silver alginate Objective Constitutional Patient is hypertensive. Pulse regular and within target range for patient.Marland Kitchen Respirations regular, non-labored and within target range.. Temperature is normal and within the target range for the patient.Marland Kitchen Appears in no distress. Vitals Time Taken: 11:42 AM, Height: 67 in, Weight: 130 lbs, BMI: 20.4, Temperature: 98.5 F, Pulse: 85 bpm, Respiratory Rate: 18 breaths/min, Blood Pressure: 156/83 mmHg. Respiratory work of breathing is normal. Cardiovascular no signs of CHF JVP not elevated. pitting edema up into his upper thighs. Gastrointestinal (GI) No liver or spleen enlargement. General Notes: Wound exam; numerous erosions, ulcers and in his legs fairly large flat skin  covered areas with underlying blisters. Same distribution as previously ie both anterior legs and right hand and wrist. Not obviously infected Integumentary (Hair, Skin) Wound #1 status is Open. Original cause of wound was Gradually Appeared. The wound is located on the Right Hand - Dorsum. The wound measures 30cm length x 26cm width x 0.1cm depth; 612.611cm^2 area and 61.261cm^3 volume. There is Fat Layer (Subcutaneous Tissue) exposed. There is no tunneling or undermining noted. There is a medium amount of purulent drainage noted. The wound margin is indistinct and nonvisible. There is medium (34-66%) red granulation within the wound bed. There is a medium (34-66%) amount of necrotic tissue within the wound bed including Adherent Slough. Wound #16 status is Open. Original cause of wound was Gradually Appeared. The wound is located on the Right,Proximal,Anterior Lower Leg. The wound measures 2.5cm length x 1.8cm width x 0.1cm depth; 3.534cm^2 area and 0.353cm^3 volume. There is Fat Layer (Subcutaneous Tissue) exposed. There is no tunneling or undermining noted. There is a medium amount of serosanguineous drainage noted. The wound margin is indistinct and nonvisible. There is large (67- 100%) red, pink, friable, hyper - granulation within the wound bed. There is no necrotic tissue within the wound bed. Wound #18 status is Open. Original cause of wound was Gradually Appeared. The wound is located on the Right T Great. The wound measures 0.5cm length oe x 0.6cm width x 0.1cm depth; 0.236cm^2 area and 0.024cm^3 volume. There is Fat Layer (Subcutaneous Tissue) exposed. There is no tunneling or undermining noted. There is a medium amount of purulent drainage noted. There is small (1-33%) red, pink granulation within the wound bed.  There is a medium (34-66%) amount of necrotic tissue within the wound bed including Adherent Slough. Wound #5 status is Open. Original cause of wound was Gradually Appeared. The  wound is located on the Right,Anterior Lower Leg. The wound measures 4.5cm length x 4.5cm width x 0.1cm depth; 15.904cm^2 area and 1.59cm^3 volume. There is Fat Layer (Subcutaneous Tissue) exposed. There is no tunneling or undermining noted. There is a medium amount of serosanguineous drainage noted. The wound margin is flat and intact. There is medium (34-66%) red, hyper - granulation within the wound bed. There is a medium (34-66%) amount of necrotic tissue within the wound bed including Adherent Slough. Wound #6 status is Open. Original cause of wound was Gradually Appeared. The wound is located on the Right,Lateral,Posterior Lower Leg. The wound measures 6.2cm length x 4cm width x 0.1cm depth; 19.478cm^2 area and 1.948cm^3 volume. There is Fat Layer (Subcutaneous Tissue) exposed. There is no tunneling or undermining noted. There is a medium amount of serosanguineous drainage noted. The wound margin is flat and intact. There is medium (34-66%) red, friable granulation within the wound bed. There is a medium (34-66%) amount of necrotic tissue within the wound bed including Adherent Slough. Wound #8 status is Open. Original cause of wound was Gradually Appeared. The wound is located on the Left,Anterior Lower Leg. The wound measures 19.5cm length x 5.5cm width x 0.1cm depth; 84.234cm^2 area and 8.423cm^3 volume. There is Fat Layer (Subcutaneous Tissue) exposed. There is no tunneling or undermining noted. There is a medium amount of serosanguineous drainage noted. The wound margin is flat and intact. There is medium (34-66%) red, friable, hyper - granulation within the wound bed. There is a medium (34-66%) amount of necrotic tissue within the wound bed including Adherent Slough. 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 Dressing Change Frequency: Change Dressing every other day. - daily as needed to drainage Skin Barriers/Peri-Wound Care: Moisturizing lotion - to both legs and feet with dressing changes Wound Cleansing: Clean wound with Wound Cleanser - all wounds Primary Wound Dressing: Wound #1 Right Hand - Dorsum: Calcium Alginate with Silver - entire right hand as well. Wound #16 Right,Proximal,Anterior Lower Leg: Calcium Alginate with Silver Wound #18 Right T Great: oe Calcium Alginate with Silver Wound #5 Right,Anterior Lower Leg: Calcium Alginate with Silver Wound #6 Right,Lateral,Posterior Lower Leg: Calcium Alginate with Silver Wound #8 Left,Anterior Lower Leg: Calcium Alginate with Silver Secondary Dressing: Kerlix/Rolled Gauze Dry Gauze - all wounds ABD pad - as needed Edema Control: Elevate legs to the level of the heart or above for 30 minutes daily and/or when sitting, a frequency of: - throughout the day 1 Again the cause of this patients skin condiion is unclear. At one point I thought this was some form of blistering skin disease. Dermatology did not agree. However today he appears of have deep blisters in his legs. I don't know the exact etiogy of this. 2 Increasing emema in his legs could be contributing to the blistering. 3 I called his daughter to suggest returning the patient to dermatology at [facility], a prolonged course of doxycycine in the interm and finally ocause of the increased  lower extremity edema. She told me he had an appointment with primary care on 11/24 then we were cut off. I'll await her return call Electronic Signature(s) Signed: 11/23/2019 1:46:42 PM By: Linton Ham MD Entered By: Linton Ham on 11/22/2019  14:37:38 -------------------------------------------------------------------------------- SuperBill Details Patient Name: Date of Service: Regino Schultze [place] 11/20/2019 Medical Record Number: [id number] Patient Account Number: [id number] Date of Birth/Sex: Treating RN: 1945-06-10 (74 y.o. [name] Primary Care Provider: Theodora Blow, MIKE Other Clinician: Referring Provider: Treating Provider/Extender: Joanne Chars, MIKE Weeks in Treatment: 42 Diagnosis Coding ICD-10 Codes Code Description [id number] Non-pressure chronic ulcer of right calf with necrosis of muscle L97.811 Non-pressure chronic ulcer of other part of right lower leg limited to breakdown of skin S61.401D Unspecified open wound of right hand, subsequent encounter L89.322 Pressure ulcer of left buttock, stage 2 L97.821 Non-pressure chronic ulcer of other part of left lower leg limited to breakdown of skin Facility Procedures CPT4 Code: [id number] Description: [place] VISIT-LEV 5 EST PT Modifier: Quantity: 1 Physician Procedures Electronic Signature(s) Signed: 11/23/2019 1:46:42 PM By: Linton Ham MD Previous Signature: 11/20/2019 6:06:29 PM Version By: Baruch Gouty RN, BSN Entered By: Linton Ham on 11/22/2019 14:38:05

## 2019-12-18 ENCOUNTER — Encounter (HOSPITAL_BASED_OUTPATIENT_CLINIC_OR_DEPARTMENT_OTHER): Payer: PRIVATE HEALTH INSURANCE | Attending: Internal Medicine | Admitting: Internal Medicine

## 2019-12-25 ENCOUNTER — Encounter (HOSPITAL_BASED_OUTPATIENT_CLINIC_OR_DEPARTMENT_OTHER): Payer: Medicare Other | Attending: Internal Medicine | Admitting: Internal Medicine

## 2019-12-25 DIAGNOSIS — L97821 Non-pressure chronic ulcer of other part of left lower leg limited to breakdown of skin: Secondary | ICD-10-CM | POA: Diagnosis not present

## 2019-12-25 DIAGNOSIS — S61401A Unspecified open wound of right hand, initial encounter: Secondary | ICD-10-CM | POA: Insufficient documentation

## 2019-12-25 DIAGNOSIS — G8250 Quadriplegia, unspecified: Secondary | ICD-10-CM | POA: Diagnosis not present

## 2019-12-25 DIAGNOSIS — L97811 Non-pressure chronic ulcer of other part of right lower leg limited to breakdown of skin: Secondary | ICD-10-CM | POA: Insufficient documentation

## 2019-12-25 DIAGNOSIS — L97213 Non-pressure chronic ulcer of right calf with necrosis of muscle: Secondary | ICD-10-CM | POA: Diagnosis present

## 2019-12-25 DIAGNOSIS — X58XXXA Exposure to other specified factors, initial encounter: Secondary | ICD-10-CM | POA: Diagnosis not present

## 2019-12-25 DIAGNOSIS — L89322 Pressure ulcer of left buttock, stage 2: Secondary | ICD-10-CM | POA: Insufficient documentation

## 2019-12-31 NOTE — Progress Notes (Signed)
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 Facility Procedures CPT4 Code: [id number] Description: [id number] - WOUND CARE VISIT-LEV 5 EST PT Modifier: Quantity: 1 Physician Procedures : CPT4 Code Description Modifier [id number] - WC PHYS LEVEL 3 - EST PT ICD-10 Diagnosis Description L97.811 Non-pressure chronic ulcer of other part of right lower leg limited to breakdown of skin S61.401D Unspecified open wound of right hand,  subsequent encounter L97.821 Non-pressure chronic ulcer of other part of left lower leg limited to breakdown of skin Quantity: 1 Electronic Signature(s) Signed: 12/29/2019 5:24:10 PM By: Levan Hurst RN, BSN Signed: 12/31/2019 8:09:40 AM By: Linton Ham MD Entered By: Levan Hurst on 12/25/2019 12:33:54

## 2019-12-31 NOTE — Progress Notes (Signed)
JAKE, FUHRMANN ([id number]) Visit Report for 12/25/2019 Arrival Information Details Patient Name: Date of Service: NUMAIR, MASDEN RD 12/25/2019 9:30 A M Medical Record Number: [id number] Patient Account Number: [id number] Date of Birth/Sex: Treating RN: February 16, 1945 (74 y.o. [name] Primary Care Delita Chiquito: Theodora Blow, MIKE Other Clinician: Referring Kayron Hicklin: Treating Hades Mathew/Extender: Joanne Chars, MIKE Weeks in Treatment: 103 Visit Information History Since Last Visit Added or deleted any medications: No Patient Arrived: Stretcher Any new allergies or adverse reactions: No Arrival Time: 10:12 Had a fall or experienced change in No Accompanied By: EMS transport activities of daily living that may affect Transfer Assistance: Stretcher risk of falls: Patient Identification Verified: Yes Signs or symptoms of abuse/neglect since last visito No Secondary Verification Process Completed: Yes Hospitalized since last visit: No Patient Requires Transmission-Based Precautions: No Implantable device outside of the clinic excluding No Patient Has Alerts: Yes cellular tissue based products placed in the center Patient Alerts: R ABI non compressible since last visit: Has Dressing in Place as Prescribed: Yes Pain Present Now: Yes Electronic Signature(s) Signed: 12/29/2019 5:24:10 PM By: Levan Hurst RN, BSN Entered By: Levan Hurst on 12/25/2019 10:13:15 -------------------------------------------------------------------------------- Clinic Level of Care Assessment Details Patient Name: Date of Service: FARDEEN, STEINBERGER RD 12/25/2019 9:30 A M Medical Record Number: [id number] Patient Account Number: [id number] Date of Birth/Sex: Treating RN: [birth year] (74 y.o. [name] Primary Care Halen Mossbarger: Theodora Blow, MIKE Other Clinician: Referring Jesalyn Finazzo: Treating Paislyn Domenico/Extender: Joanne Chars, MIKE Weeks in Treatment: 103 Clinic Level of Care Assessment  Items TOOL 4 Quantity Score X- 1 0 Use when only an EandM is performed on FOLLOW-UP visit ASSESSMENTS - Nursing Assessment / Reassessment X- 1 10 Reassessment of Co-morbidities (includes updates in patient status) X- 1 5 Reassessment of Adherence to Treatment Plan ASSESSMENTS - Wound and Skin A ssessment / Reassessment '[]'  - 0 Simple Wound Assessment / Reassessment - one wound X- 7 5 Complex Wound Assessment / Reassessment - multiple wounds '[]'  - 0 Dermatologic / Skin Assessment (not related to wound area) ASSESSMENTS - Focused Assessment '[]'  - 0 Circumferential Edema Measurements - multi extremities '[]'  - 0 Nutritional Assessment / Counseling / Intervention X- 1 5 Lower Extremity Assessment (monofilament, tuning fork, pulses) '[]'  - 0 Peripheral Arterial Disease Assessment (using hand held doppler) ASSESSMENTS - Ostomy and/or Continence Assessment and Care '[]'  - 0 Incontinence Assessment and Management '[]'  - 0 Ostomy Care Assessment and Management (repouching, etc.) PROCESS - Coordination of Care X - Simple Patient / Family Education for ongoing care 1 15 '[]'  - 0 Complex (extensive) Patient / Family Education for ongoing care X- 1 10 Staff obtains Programmer, systems, Records, T Results / Process Orders est '[]'  - 0 Staff telephones HHA, Nursing Homes / Clarify orders / etc '[]'  - 0 Routine Transfer to another Facility (non-emergent condition) '[]'  - 0 Routine Hospital Admission (non-emergent condition) '[]'  - 0 New Admissions / Biomedical engineer / Ordering NPWT Apligraf, etc. , '[]'  - 0 Emergency Hospital Admission (emergent condition) X- 1 10 Simple Discharge Coordination '[]'  - 0 Complex (extensive) Discharge Coordination PROCESS - Special Needs '[]'  - 0 Pediatric / Minor Patient Management '[]'  - 0 Isolation Patient Management '[]'  - 0 Hearing / Language / Visual special needs '[]'  - 0 Assessment of Community assistance (transportation, D/C planning, etc.) '[]'  - 0 Additional  assistance / Altered mentation '[]'  - 0 Support Surface(s) Assessment (bed, cushion, seat, etc.) INTERVENTIONS - Wound Cleansing / Measurement '[]'  - 0 Simple Wound Cleansing - one wound X-  7 5 Complex Wound Cleansing - multiple wounds X- 1 5 Wound Imaging (photographs - any number of wounds) '[]'  - 0 Wound Tracing (instead of photographs) '[]'  - 0 Simple Wound Measurement - one wound X- 7 5 Complex Wound Measurement - multiple wounds INTERVENTIONS - Wound Dressings X - Small Wound Dressing one or multiple wounds 1 10 '[]'  - 0 Medium Wound Dressing one or multiple wounds X- 2 20 Large Wound Dressing one or multiple wounds X- 1 5 Application of Medications - topical '[]'  - 0 Application of Medications - injection INTERVENTIONS - Miscellaneous '[]'  - 0 External ear exam '[]'  - 0 Specimen Collection (cultures, biopsies, blood, body fluids, etc.) '[]'  - 0 Specimen(s) / Culture(s) sent or taken to Lab for analysis '[]'  - 0 Patient Transfer (multiple staff / Civil Service fast streamer / Similar devices) '[]'  - 0 Simple Staple / Suture removal (25 or less) '[]'  - 0 Complex Staple / Suture removal (26 or more) '[]'  - 0 Hypo / Hyperglycemic Management (close monitor of Blood Glucose) '[]'  - 0 Ankle / Brachial Index (ABI) - do not check if billed separately X- 1 5 Vital Signs Has the patient been seen at the hospital within the last three years: Yes Total Score: 225 Level Of Care: New/Established - Level 5 Electronic Signature(s) Signed: 12/29/2019 5:24:10 PM By: Levan Hurst RN, BSN Entered By: Levan Hurst on 12/25/2019 12:33:45 -------------------------------------------------------------------------------- Encounter Discharge Information Details Patient Name: Date of Service: Regino Schultze RD 12/25/2019 9:30 A M Medical Record Number: [id number] Patient Account Number: [id number] Date of Birth/Sex: Treating RN: 16-Jan-1945 (74 y.o. [name] Primary Care Celine Dishman: Theodora Blow, MIKE Other  Clinician: Referring Jaicee Michelotti: Treating Ewel Lona/Extender: Joanne Chars, MIKE Weeks in Treatment: [phone] Encounter Discharge Information Items Discharge Condition: Stable Ambulatory Status: Stretcher Discharge Destination: Home Transportation: Private Auto Accompanied ByMable Paris Schedule Follow-up Appointment: Yes Clinical Summary of Care: Electronic Signature(s) Signed: 12/25/2019 2:12:11 PM By: Deon Pilling Entered By: Deon Pilling on 12/25/2019 11:55:18 -------------------------------------------------------------------------------- Lower Extremity Assessment Details Patient Name: Date of Service: LYMON, KIDNEY RD 12/25/2019 9:30 A M Medical Record Number: [id number] Patient Account Number: [id number] Date of Birth/Sex: Treating RN: 08-Jun-1945 (74 y.o. [name] Primary Care Devrin Monforte: Theodora Blow, MIKE Other Clinician: Referring Terrisha Lopata: Treating Genecis Veley/Extender: Joanne Chars, MIKE Weeks in Treatment: 103 Edema Assessment Assessed: [Left: No] [Right: No] Edema: [Left: Yes] [Right: Yes] Calf Left: Right: Point of Measurement: From Medial Instep 40 cm 41 cm Ankle Left: Right: Point of Measurement: From Medial Instep 19 cm 19 cm Electronic Signature(s) Signed: 12/29/2019 5:24:10 PM By: Levan Hurst RN, BSN Entered By: Levan Hurst on 12/25/2019 10:19:37 -------------------------------------------------------------------------------- Multi Wound Chart Details Patient Name: Date of Service: Regino Schultze RD 12/25/2019 9:30 A M Medical Record Number: [id number] Patient Account Number: [id number] Date of Birth/Sex: Treating RN: 1945/09/12 (74 y.o. [name] Primary Care Caryn Gienger: Theodora Blow, MIKE Other Clinician: Referring Dawson Albers: Treating Siniya Lichty/Extender: Joanne Chars, MIKE Weeks in Treatment: 103 Vital Signs Height(in): 67 Pulse(bpm): 94 Weight(lbs): 130 Blood Pressure(mmHg): 168/96 Body Mass Index(BMI):  20 Temperature(F): 98.8 Respiratory Rate(breaths/min): 18 Photos: [1:No Photos Right Hand - Dorsum] [16:No Photos Right, Proximal, Anterior Lower Leg Right T Great] [18:No Photos oe] Wound Location: [1:Gradually Appeared] [16:Gradually Appeared] [18:Gradually Appeared] Wounding Event: [1:Atypical] [16:Atypical] [18:Atypical] Primary Etiology: [1:Anemia, Hypertension, Type II] [16:Anemia, Hypertension, Type II] [18:Anemia, Hypertension, Type II] Comorbid History: [1:Diabetes, Paraplegia 11/15/2017] [16:Diabetes, Paraplegia 07/10/2019] [18:Diabetes, Paraplegia 11/16/2019] Date Acquired: [1:103] [16:24] [18:5] Weeks of Treatment: [1:Open] [16:Open] [18:Healed -  Epithelialized] Wound Status: [1:Yes] [16:No] [18:No] Clustered Wound: [1:4] [16:N/A] [18:N/A] Clustered Quantity: [1:22x25x0.1] [16:0.3x1x0.1] [18:0x0x0] Measurements L x W x D (cm) [1:431.969] [16:0.236] [18:0] A (cm) : rea [1:43.197] [75:9.163] [18:0] Volume (cm) : [1:-1254.70%] [16:57.10%] [18:100.00%] % Reduction in Area: [1:-1254.60%] [16:56.40%] [18:100.00%] % Reduction in Volume: [1:Full Thickness Without Exposed] [16:Full Thickness Without Exposed] [18:Full Thickness With Exposed Support] Classification: [1:Support Structures Medium] [16:Support Structures Medium] [18:Structures None Present] Exudate A mount: [1:Serosanguineous] [16:Serosanguineous] [18:N/A] Exudate Type: [1:red, brown] [16:red, brown] [18:N/A] Exudate Color: [1:Indistinct, nonvisible] [16:Indistinct, nonvisible] [18:N/A] Wound Margin: [1:Medium (34-66%)] [16:Large (67-100%)] [18:None Present (0%)] Granulation Amount: [1:Pink] [16:Red, Pink, Hyper-granulation] [18:N/A] Granulation Quality: [1:Medium (34-66%)] [16:None Present (0%)] [18:None Present (0%)] Necrotic Amount: [1:Eschar, Adherent Slough] [16:N/A] [18:N/A] Necrotic Tissue: [1:Fat Layer (Subcutaneous Tissue): Yes Fat Layer (Subcutaneous Tissue): Yes Fascia: No] Exposed Structures: [1:Fascia: No  Tendon: No Muscle: No Joint: No Bone: No Medium (34-66%)] [16:Fascia: No Tendon: No Muscle: No Joint: No Bone: No Medium (34-66%)] [18:Fat Layer (Subcutaneous Tissue): No Tendon: No Muscle: No Joint: No Bone: No Large (67-100%)] Wound Number: '[id number] ' Photos: No Photos No Photos No Photos Left T Fourth oe Left T Third oe Right, Anterior Lower Leg Wound Location: Gradually Appeared Gradually Appeared Gradually Appeared Wounding Event: 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 Primary Etiology: Extremity Extremity Extremity Anemia, Hypertension, Type II Anemia, Hypertension, Type II Anemia, Hypertension, Type II Comorbid History: Diabetes, Paraplegia Diabetes, Paraplegia Diabetes, Paraplegia 12/25/2019 12/25/2019 11/15/2017 Date Acquired: 0 0 103 Weeks of Treatment: Open Open Open Wound Status: No No Yes Clustered Wound: N/A N/A 2 Clustered Quantity: 1.5x1.5x0.1 1x0.4x0.1 10x8x0.1 Measurements L x W x D (cm) 1.767 0.314 62.832 A (cm) : rea 0.177 0.031 6.283 Volume (cm) : N/A N/A 45.50% % Reduction in Area: N/A N/A 45.50% % Reduction in Volume: Grade 2 Grade 2 Grade 2 Classification: Medium Medium Medium Exudate A mount: Serosanguineous Serosanguineous Serosanguineous Exudate Type: red, brown red, brown red, brown Exudate Color: Flat and Intact Flat and Intact Flat and Intact Wound Margin: Large (67-100%) Large (67-100%) Medium (34-66%) Granulation A mount: Pink Pink Red, Hyper-granulation Granulation Quality: None Present (0%) Small (1-33%) None Present (0%) Necrotic A mount: N/A Adherent Slough N/A Necrotic Tissue: Fat Layer (Subcutaneous Tissue): Yes Fat Layer (Subcutaneous Tissue): Yes Fat Layer (Subcutaneous Tissue): Yes Exposed Structures: Fascia: No Fascia: No Fascia: No Tendon: No Tendon: No Tendon: No Muscle: No Muscle: No Muscle: No Joint: No Joint: No Joint: No Bone: No Bone:  No Bone: No None None Small (1-33%) Epithelialization: Wound Number: [id number] N/A Photos: No Photos No Photos N/A Right, Lateral, Posterior Lower Leg Left, Anterior Lower Leg N/A Wound Location: Gradually Appeared Gradually Appeared N/A Wounding Event: 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 N/A Primary Etiology: Extremity Extremity Anemia, Hypertension, Type II Anemia, Hypertension, Type II N/A Comorbid History: Diabetes, Paraplegia Diabetes, Paraplegia 11/15/2017 11/15/2017 N/A Date Acquired: 103 103 N/A Weeks of Treatment: Open Open N/A Wound Status: Yes Yes N/A Clustered Wound: 1 9 N/A Clustered Quantity: 4.5x3.5x0.1 23x11.5x0.1 N/A Measurements L x W x D (cm) 12.37 207.738 N/A A (cm) : rea 1.237 20.774 N/A Volume (cm) : 54.30% -918.90% N/A % Reduction in A rea: 54.30% -918.80% N/A % Reduction in Volume: Grade 2 Grade 2 N/A Classification: Medium Medium N/A Exudate A mount: Serosanguineous Serosanguineous N/A Exudate Type: red, brown red, brown N/A Exudate Color: Flat and Intact Flat and Intact N/A Wound Margin: Medium (34-66%)  Large (67-100%) N/A Granulation A mount: Red, Friable Red, Hyper-granulation N/A Granulation Quality: Medium (34-66%) Small (1-33%) N/A Necrotic A mount: Adherent [facility] N/A Necrotic Tissue: Fat Layer (Subcutaneous Tissue): Yes Fat Layer (Subcutaneous Tissue): Yes N/A Exposed Structures: Fascia: No Fascia: No Tendon: No Tendon: No Muscle: No Muscle: No Joint: No Joint: No Bone: No Bone: No Medium (34-66%) Large (67-100%) N/A Epithelialization: Treatment Notes Electronic Signature(s) Signed: 12/25/2019 5:15:15 PM By: Baruch Gouty RN, BSN Signed: 12/31/2019 8:09:40 AM By: Linton Ham MD Entered By: Linton Ham on 12/25/2019 11:08:38 -------------------------------------------------------------------------------- Multi-Disciplinary Care Plan Details Patient  Name: Date of Service: ELIN, SEATS RD 12/25/2019 9:30 A M Medical Record Number: [id number] Patient Account Number: [id number] Date of Birth/Sex: Treating RN: [birth year] (74 y.o. [name] Primary Care Nygel Prokop: Theodora Blow, MIKE Other Clinician: Referring Teller Wakefield: Treating Korban Shearer/Extender: Joanne Chars, MIKE Weeks in Treatment: 103 Active Inactive Wound/Skin Impairment Nursing Diagnoses: Knowledge deficit related to ulceration/compromised skin integrity Goals: Patient/caregiver will verbalize understanding of skin care regimen Date Initiated: 05/20/2019 Target Resolution Date: 01/15/2020 Goal Status: Active Ulcer/skin breakdown will have a volume reduction of 30% by week 4 Date Initiated: 12/31/2017 Date Inactivated: 05/20/2019 Target Resolution Date: 06/18/2018 Goal Status: Met Interventions: Assess patient/caregiver ability to obtain necessary supplies Assess patient/caregiver ability to perform ulcer/skin care regimen upon admission and as needed Assess ulceration(s) every visit Notes: Electronic Signature(s) Signed: 12/25/2019 5:15:15 PM By: Baruch Gouty RN, BSN Entered By: Baruch Gouty on 12/25/2019 10:40:04 -------------------------------------------------------------------------------- Pain Assessment Details Patient Name: Date of Service: Regino Schultze RD 12/25/2019 9:30 A M Medical Record Number: [id number] Patient Account Number: [id number] Date of Birth/Sex: Treating RN: 1945-12-22 (74 y.o. [name] Primary Care Murle Otting: Theodora Blow, MIKE Other Clinician: Referring Taj Nevins: Treating Ambre Kobayashi/Extender: Joanne Chars, MIKE Weeks in Treatment: [phone] Active Problems Location of Pain Severity and Description of Pain Patient Has Paino Yes Site Locations Pain Location: Pain in Ulcers With Dressing Change: Yes Duration of the Pain. Constant / Intermittento Intermittent Rate the pain. Current Pain Level: 6 Character of  Pain Describe the Pain: Tender Pain Management and Medication Current Pain Management: Medication: Yes Cold Application: No Rest: No Massage: No Activity: No T.E.N.S.: No Heat Application: No Leg drop or elevation: No Is the Current Pain Management Adequate: Adequate How does your wound impact your activities of daily livingo Sleep: No Bathing: No Appetite: No Relationship With Others: No Bladder Continence: No Emotions: No Bowel Continence: No Work: No Toileting: No Drive: No Dressing: No Hobbies: No Electronic Signature(s) Signed: 12/29/2019 5:24:10 PM By: Levan Hurst RN, BSN Entered By: Levan Hurst on 12/25/2019 10:13:59 -------------------------------------------------------------------------------- Patient/Caregiver Education Details Patient Name: Date of Service: Regino Schultze RD 12/10/2021andnbsp9:30 A M Medical Record Number: [id number] Patient Account Number: [id number] Date of Birth/Gender: Treating RN: 04-19-1945 (74 y.o. [name] Primary Care Physician: Theodora Blow, MIKE Other Clinician: Referring Physician: Treating Physician/Extender: Joanne Chars, MIKE Weeks in Treatment: [phone] Education Assessment Education Provided To: Patient Education Topics Provided Infection: Methods: Explain/Verbal Responses: Reinforcements needed, State content correctly Wound/Skin Impairment: Methods: Explain/Verbal Responses: Reinforcements needed, State content correctly Electronic Signature(s) Signed: 12/25/2019 5:15:15 PM By: Baruch Gouty RN, BSN Entered By: Baruch Gouty on 12/25/2019 10:40:30 -------------------------------------------------------------------------------- Wound Assessment Details Patient Name: Date of Service: Regino Schultze RD 12/25/2019 9:30 A M Medical Record Number: [id number] Patient Account Number: [id number] Date of Birth/Sex: Treating RN: 26-Jan-1945 (74 y.o. [name] Primary Care Tessy Pawelski: Theodora Blow,  MIKE Other Clinician: Referring Deforrest Bogle: Treating Kit Brubacher/Extender: Linton Ham  GA SSEMI, MIKE Weeks in Treatment: 103 Wound Status Wound Number: 1 Primary Etiology: Atypical Wound Location: Right Hand - Dorsum Wound Status: Open Wounding Event: Gradually Appeared Comorbid History: Anemia, Hypertension, Type II Diabetes, Paraplegia Date Acquired: 11/15/2017 Weeks Of Treatment: 103 Clustered Wound: Yes Photos Photo Uploaded By: Mikeal Hawthorne on 12/30/2019 13:17:48 Wound Measurements Length: (cm) 22 Width: (cm) 25 Depth: (cm) 0.1 Clustered Quantity: 4 Area: (cm) 431.969 Volume: (cm) 43.197 % Reduction in Area: -1254.7% % Reduction in Volume: -1254.6% Epithelialization: Medium (34-66%) Tunneling: No Undermining: No Wound Description Classification: Full Thickness Without Exposed Support Structures Wound Margin: Indistinct, nonvisible Exudate Amount: Medium Exudate Type: Serosanguineous Exudate Color: red, brown Foul Odor After Cleansing: No Slough/Fibrino Yes Wound Bed Granulation Amount: Medium (34-66%) Exposed Structure Granulation Quality: Pink Fascia Exposed: No Necrotic Amount: Medium (34-66%) Fat Layer (Subcutaneous Tissue) Exposed: Yes Necrotic Quality: Eschar, Adherent Slough Tendon Exposed: No Muscle Exposed: No Joint Exposed: No Bone Exposed: No Treatment Notes Wound #1 (Hand - Dorsum) Wound Laterality: Right Cleanser Wound Cleanser Discharge Instruction: Cleanse the wound with wound cleanser prior to applying a clean dressing using gauze sponges, not tissue or cotton balls. Peri-Wound Care Sween Lotion (Moisturizing lotion) Discharge Instruction: Apply moisturizing lotion to both legs Topical Primary Dressing KerraCel Ag Gelling Fiber Dressing, 4x5 in (silver alginate) Discharge Instruction: Apply silver alginate to wound bed as instructed Secondary Dressing Woven Gauze Sponge, Non-Sterile 4x4 in Discharge Instruction: Apply over primary  dressing as directed. ABD Pad, 5x9 Discharge Instruction: Apply over primary dressing as directed. Secured With [employer], 4.5x3.1 (in/yd) Discharge Instruction: Secure with Kerlix as directed. Compression Wrap Compression Stockings Add-Ons Electronic Signature(s) Signed: 12/29/2019 5:24:10 PM By: Levan Hurst RN, BSN Entered By: Levan Hurst on 12/25/2019 10:15:45 -------------------------------------------------------------------------------- Wound Assessment Details Patient Name: Date of Service: Regino Schultze RD 12/25/2019 9:30 A M Medical Record Number: [id number] Patient Account Number: [id number] Date of Birth/Sex: Treating RN: [birth year] (74 y.o. [name] Primary Care Misheel Gowans: Theodora Blow, MIKE Other Clinician: Referring Gissella Niblack: Treating Tatanisha Cuthbert/Extender: Joanne Chars, MIKE Weeks in Treatment: 103 Wound Status Wound Number: 16 Primary Etiology: Atypical Wound Location: Right, Proximal, Anterior Lower Leg Wound Status: Open Wounding Event: Gradually Appeared Comorbid History: Anemia, Hypertension, Type II Diabetes, Paraplegia Date Acquired: 07/10/2019 Weeks Of Treatment: 24 Clustered Wound: No Photos Photo Uploaded By: Mikeal Hawthorne on 12/30/2019 13:17:51 Wound Measurements Length: (cm) 0.3 Width: (cm) 1 Depth: (cm) 0.1 Area: (cm) 0.236 Volume: (cm) 0.024 % Reduction in Area: 57.1% % Reduction in Volume: 56.4% Epithelialization: Medium (34-66%) Tunneling: No Undermining: No Wound Description Classification: Full Thickness Without Exposed Support Structures Wound Margin: Indistinct, nonvisible Exudate Amount: Medium Exudate Type: Serosanguineous Exudate Color: red, brown Foul Odor After Cleansing: No Slough/Fibrino No Wound Bed Granulation Amount: Large (67-100%) Exposed Structure Granulation Quality: Red, Pink, Hyper-granulation Fascia Exposed: No Necrotic Amount: None Present (0%) Fat Layer (Subcutaneous Tissue)  Exposed: Yes Tendon Exposed: No Muscle Exposed: No Joint Exposed: No Bone Exposed: No Treatment Notes Wound #16 (Lower Leg) Wound Laterality: Right, Anterior, Proximal Cleanser Wound Cleanser Discharge Instruction: Cleanse the wound with wound cleanser prior to applying a clean dressing using gauze sponges, not tissue or cotton balls. Peri-Wound Care Sween Lotion (Moisturizing lotion) Discharge Instruction: Apply moisturizing lotion to both legs Topical Primary Dressing KerraCel Ag Gelling Fiber Dressing, 4x5 in (silver alginate) Discharge Instruction: Apply silver alginate to wound bed as instructed Secondary Dressing Woven Gauze Sponge, Non-Sterile 4x4 in Discharge Instruction: Apply over primary dressing as directed. ABD Pad, 5x9 Discharge  Instruction: Apply over primary dressing as directed. Secured With [employer], 4.5x3.1 (in/yd) Discharge Instruction: Secure with Kerlix as directed. Compression Wrap Compression Stockings Add-Ons Electronic Signature(s) Signed: 12/29/2019 5:24:10 PM By: Levan Hurst RN, BSN Entered By: Levan Hurst on 12/25/2019 10:16:11 -------------------------------------------------------------------------------- Wound Assessment Details Patient Name: Date of Service: Regino Schultze RD 12/25/2019 9:30 A M Medical Record Number: [id number] Patient Account Number: [id number] Date of Birth/Sex: Treating RN: Oct 31, 1945 (74 y.o. [name] Primary Care Rowan Blaker: Theodora Blow, MIKE Other Clinician: Referring Bristal Steffy: Treating Yojan Paskett/Extender: Joanne Chars, MIKE Weeks in Treatment: 103 Wound Status Wound Number: 18 Primary Etiology: Atypical Wound Location: Right T Great oe Wound Status: Healed - Epithelialized Wounding Event: Gradually Appeared Comorbid History: Anemia, Hypertension, Type II Diabetes, Paraplegia Date Acquired: 11/16/2019 Weeks Of Treatment: 5 Clustered Wound: No Photos Photo Uploaded By: Mikeal Hawthorne on 12/30/2019 13:18:38 Wound Measurements Length: (cm) Width: (cm) Depth: (cm) Area: (cm) Volume: (cm) 0 % Reduction in Area: 100% 0 % Reduction in Volume: 100% 0 Epithelialization: Large (67-100%) 0 Tunneling: No 0 Undermining: No Wound Description Classification: Full Thickness With Exposed Support Structures Exudate Amount: None Present Foul Odor After Cleansing: No Slough/Fibrino No Wound Bed Granulation Amount: None Present (0%) Exposed Structure Necrotic Amount: None Present (0%) Fascia Exposed: No Fat Layer (Subcutaneous Tissue) Exposed: No Tendon Exposed: No Muscle Exposed: No Joint Exposed: No Bone Exposed: No Electronic Signature(s) Signed: 12/29/2019 5:24:10 PM By: Levan Hurst RN, BSN Entered By: Levan Hurst on 12/25/2019 10:16:29 -------------------------------------------------------------------------------- Wound Assessment Details Patient Name: Date of Service: Regino Schultze RD 12/25/2019 9:30 A M Medical Record Number: [id number] Patient Account Number: [id number] Date of Birth/Sex: Treating RN: 1945/12/30 (74 y.o. [name] Railey Glad: Theodora Blow, MIKE Other Clinician: Referring Jeovany Huitron: Treating Latishia Suitt/Extender: Joanne Chars, MIKE Weeks in Treatment: 103 Wound Status Wound Number: 19 Primary Etiology: Diabetic Wound/Ulcer of the Lower Extremity Wound Location: Left T Fourth oe Wound Status: Open Wounding Event: Gradually Appeared Comorbid History: Anemia, Hypertension, Type II Diabetes, Paraplegia Date Acquired: 12/25/2019 Weeks Of Treatment: 0 Clustered Wound: No Photos Photo Uploaded By: Mikeal Hawthorne on 12/30/2019 13:18:38 Wound Measurements Length: (cm) 1.5 Width: (cm) 1.5 Depth: (cm) 0.1 Area: (cm) 1.767 Volume: (cm) 0.177 % Reduction in Area: % Reduction in Volume: Epithelialization: None Tunneling: No Undermining: No Wound Description Classification: Grade 2 Wound Margin: Flat  and Intact Exudate Amount: Medium Exudate Type: Serosanguineous Exudate Color: red, brown Foul Odor After Cleansing: No Slough/Fibrino No Wound Bed Granulation Amount: Large (67-100%) Exposed Structure Granulation Quality: Pink Fascia Exposed: No Necrotic Amount: None Present (0%) Fat Layer (Subcutaneous Tissue) Exposed: Yes Tendon Exposed: No Muscle Exposed: No Joint Exposed: No Bone Exposed: No Treatment Notes Wound #19 (Toe Fourth) Wound Laterality: Left Cleanser Wound Cleanser Discharge Instruction: Cleanse the wound with wound cleanser prior to applying a clean dressing using gauze sponges, not tissue or cotton balls. Peri-Wound Care Sween Lotion (Moisturizing lotion) Discharge Instruction: Apply moisturizing lotion to both legs Topical Primary Dressing KerraCel Ag Gelling Fiber Dressing, 4x5 in (silver alginate) Discharge Instruction: Apply silver alginate to wound bed as instructed Secondary Dressing Woven Gauze Sponge, Non-Sterile 4x4 in Discharge Instruction: Apply over primary dressing as directed. ABD Pad, 5x9 Discharge Instruction: Apply over primary dressing as directed. Secured With [employer], 4.5x3.1 (in/yd) Discharge Instruction: Secure with Kerlix as directed. Compression Wrap Compression Stockings Add-Ons Electronic Signature(s) Signed: 12/29/2019 5:24:10 PM By: Levan Hurst RN, BSN Entered By: Levan Hurst on 12/25/2019 10:18:30 -------------------------------------------------------------------------------- Wound Assessment  Details Patient Name: Date of Service: KIPLING, GRASER RD 12/25/2019 9:30 A M Medical Record Number: [id number] Patient Account Number: [id number] Date of Birth/Sex: Treating RN: 1945-10-14 (74 y.o. [name] Primary Care Apolo Cutshaw: Theodora Blow, MIKE Other Clinician: Referring Lakeitha Basques: Treating Bryanda Mikel/Extender: Joanne Chars, MIKE Weeks in Treatment: 103 Wound Status Wound Number: 20 Primary  Etiology: Diabetic Wound/Ulcer of the Lower Extremity Wound Location: Left T Third oe Wound Status: Open Wounding Event: Gradually Appeared Comorbid History: Anemia, Hypertension, Type II Diabetes, Paraplegia Date Acquired: 12/25/2019 Weeks Of Treatment: 0 Clustered Wound: No Photos Photo Uploaded By: Mikeal Hawthorne on 12/30/2019 13:18:54 Wound Measurements Length: (cm) 1 Width: (cm) 0.4 Depth: (cm) 0.1 Area: (cm) 0.314 Volume: (cm) 0.031 % Reduction in Area: % Reduction in Volume: Epithelialization: None Tunneling: No Undermining: No Wound Description Classification: Grade 2 Wound Margin: Flat and Intact Exudate Amount: Medium Exudate Type: Serosanguineous Exudate Color: red, brown Foul Odor After Cleansing: No Slough/Fibrino Yes Wound Bed Granulation Amount: Large (67-100%) Exposed Structure Granulation Quality: Pink Fascia Exposed: No Necrotic Amount: Small (1-33%) Fat Layer (Subcutaneous Tissue) Exposed: Yes Necrotic Quality: Adherent Slough Tendon Exposed: No Muscle Exposed: No Joint Exposed: No Bone Exposed: No Treatment Notes Wound #20 (Toe Third) Wound Laterality: Left Cleanser Wound Cleanser Discharge Instruction: Cleanse the wound with wound cleanser prior to applying a clean dressing using gauze sponges, not tissue or cotton balls. Peri-Wound Care Sween Lotion (Moisturizing lotion) Discharge Instruction: Apply moisturizing lotion to both legs Topical Primary Dressing KerraCel Ag Gelling Fiber Dressing, 4x5 in (silver alginate) Discharge Instruction: Apply silver alginate to wound bed as instructed Secondary Dressing Woven Gauze Sponge, Non-Sterile 4x4 in Discharge Instruction: Apply over primary dressing as directed. ABD Pad, 5x9 Discharge Instruction: Apply over primary dressing as directed. Secured With [employer], 4.5x3.1 (in/yd) Discharge Instruction: Secure with Kerlix as directed. Compression Wrap Compression  Stockings Add-Ons Electronic Signature(s) Signed: 12/29/2019 5:24:10 PM By: Levan Hurst RN, BSN Signed: 12/29/2019 5:24:10 PM By: Levan Hurst RN, BSN Entered By: Levan Hurst on 12/25/2019 10:19:23 -------------------------------------------------------------------------------- Wound Assessment Details Patient Name: Date of Service: Regino Schultze RD 12/25/2019 9:30 A M Medical Record Number: [id number] Patient Account Number: [id number] Date of Birth/Sex: Treating RN: 08-29-1945 (74 y.o. [name] Primary Care Ernest Orr: Theodora Blow, MIKE Other Clinician: Referring Freddi Forster: Treating Lagina Reader/Extender: Joanne Chars, MIKE Weeks in Treatment: 103 Wound Status Wound Number: 5 Primary Etiology: Diabetic Wound/Ulcer of the Lower Extremity Wound Location: Right, Anterior Lower Leg Wound Status: Open Wounding Event: Gradually Appeared Comorbid History: Anemia, Hypertension, Type II Diabetes, Paraplegia Date Acquired: 11/15/2017 Weeks Of Treatment: 103 Clustered Wound: Yes Photos Photo Uploaded By: Mikeal Hawthorne on 12/30/2019 13:18:16 Wound Measurements Length: (cm) 10 Width: (cm) 8 Depth: (cm) 0.1 Clustered Quantity: 2 Area: (cm) 62.832 Volume: (cm) 6.283 % Reduction in Area: 45.5% % Reduction in Volume: 45.5% Epithelialization: Small (1-33%) Tunneling: No Undermining: No Wound Description Classification: Grade 2 Wound Margin: Flat and Intact Exudate Amount: Medium Exudate Type: Serosanguineous Exudate Color: red, brown Foul Odor After Cleansing: No Slough/Fibrino Yes Wound Bed Granulation Amount: Medium (34-66%) Exposed Structure Granulation Quality: Red, Hyper-granulation Fascia Exposed: No Necrotic Amount: None Present (0%) Fat Layer (Subcutaneous Tissue) Exposed: Yes Tendon Exposed: No Muscle Exposed: No Joint Exposed: No Bone Exposed: No Treatment Notes Wound #5 (Lower Leg) Wound Laterality: Right, Anterior Cleanser Wound  Cleanser Discharge Instruction: Cleanse the wound with wound cleanser prior to applying a clean dressing using gauze sponges, not tissue or cotton balls. Peri-Wound Care Sween Lotion (  Moisturizing lotion) Discharge Instruction: Apply moisturizing lotion to both legs Topical Primary Dressing KerraCel Ag Gelling Fiber Dressing, 4x5 in (silver alginate) Discharge Instruction: Apply silver alginate to wound bed as instructed Secondary Dressing Woven Gauze Sponge, Non-Sterile 4x4 in Discharge Instruction: Apply over primary dressing as directed. ABD Pad, 5x9 Discharge Instruction: Apply over primary dressing as directed. Secured With [employer], 4.5x3.1 (in/yd) Discharge Instruction: Secure with Kerlix as directed. Compression Wrap Compression Stockings Add-Ons Electronic Signature(s) Signed: 12/29/2019 5:24:10 PM By: Levan Hurst RN, BSN Entered By: Levan Hurst on 12/25/2019 10:16:54 -------------------------------------------------------------------------------- Wound Assessment Details Patient Name: Date of Service: Regino Schultze RD 12/25/2019 9:30 A M Medical Record Number: [id number] Patient Account Number: [id number] Date of Birth/Sex: Treating RN: 1945/06/20 (74 y.o. [name] Primary Care Mildred Tuccillo: Theodora Blow, MIKE Other Clinician: Referring Nisaiah Bechtol: Treating Thedore Pickel/Extender: Joanne Chars, MIKE Weeks in Treatment: 103 Wound Status Wound Number: 6 Primary Etiology: Diabetic Wound/Ulcer of the Lower Extremity Wound Location: Right, Lateral, Posterior Lower Leg Wound Status: Open Wounding Event: Gradually Appeared Comorbid History: Anemia, Hypertension, Type II Diabetes, Paraplegia Date Acquired: 11/15/2017 Weeks Of Treatment: 103 Clustered Wound: Yes Photos Photo Uploaded By: Mikeal Hawthorne on 12/30/2019 13:17:22 Wound Measurements Length: (cm) 4.5 Width: (cm) 3.5 Depth: (cm) 0.1 Clustered Quantity: 1 Area: (cm) 12.37 Volume:  (cm) 1.237 % Reduction in Area: 54.3% % Reduction in Volume: 54.3% Epithelialization: Medium (34-66%) Tunneling: No Wound Description Classification: Grade 2 Wound Margin: Flat and Intact Exudate Amount: Medium Exudate Type: Serosanguineous Exudate Color: red, brown Foul Odor After Cleansing: No Slough/Fibrino Yes Wound Bed Granulation Amount: Medium (34-66%) Exposed Structure Granulation Quality: Red, Friable Fascia Exposed: No Necrotic Amount: Medium (34-66%) Fat Layer (Subcutaneous Tissue) Exposed: Yes Necrotic Quality: Adherent Slough Tendon Exposed: No Muscle Exposed: No Joint Exposed: No Bone Exposed: No Treatment Notes Wound #6 (Lower Leg) Wound Laterality: Right, Lateral, Posterior Cleanser Wound Cleanser Discharge Instruction: Cleanse the wound with wound cleanser prior to applying a clean dressing using gauze sponges, not tissue or cotton balls. Peri-Wound Care Sween Lotion (Moisturizing lotion) Discharge Instruction: Apply moisturizing lotion to both legs Topical Primary Dressing KerraCel Ag Gelling Fiber Dressing, 4x5 in (silver alginate) Discharge Instruction: Apply silver alginate to wound bed as instructed Secondary Dressing Woven Gauze Sponge, Non-Sterile 4x4 in Discharge Instruction: Apply over primary dressing as directed. ABD Pad, 5x9 Discharge Instruction: Apply over primary dressing as directed. Secured With [employer], 4.5x3.1 (in/yd) Discharge Instruction: Secure with Kerlix as directed. Compression Wrap Compression Stockings Add-Ons Electronic Signature(s) Signed: 12/29/2019 5:24:10 PM By: Levan Hurst RN, BSN Entered By: Levan Hurst on 12/25/2019 10:17:09 -------------------------------------------------------------------------------- Wound Assessment Details Patient Name: Date of Service: Regino Schultze RD 12/25/2019 9:30 A M Medical Record Number: [id number] Patient Account Number: [id number] Date of Birth/Sex: Treating  RN: 1945/01/22 (74 y.o. [name] Primary Care Makaley Storts: Theodora Blow, MIKE Other Clinician: Referring Zoanne Newill: Treating Genesi Stefanko/Extender: Joanne Chars, MIKE Weeks in Treatment: 103 Wound Status Wound Number: 8 Primary Etiology: Diabetic Wound/Ulcer of the Lower Extremity Wound Location: Left, Anterior Lower Leg Wound Status: Open Wounding Event: Gradually Appeared Comorbid History: Anemia, Hypertension, Type II Diabetes, Paraplegia Date Acquired: 11/15/2017 Weeks Of Treatment: 103 Clustered Wound: Yes Photos Photo Uploaded By: Mikeal Hawthorne on 12/30/2019 13:17:22 Wound Measurements Length: (cm) 23 Width: (cm) 11.5 Depth: (cm) 0.1 Clustered Quantity: 9 Area: (cm) 207.738 Volume: (cm) 20.774 % Reduction in Area: -918.9% % Reduction in Volume: -918.8% Epithelialization: Large (67-100%) Tunneling: No Undermining: No Wound Description Classification: Grade 2 Wound Margin:  Flat and Intact Exudate Amount: Medium Exudate Type: Serosanguineous Exudate Color: red, brown Foul Odor After Cleansing: No Slough/Fibrino Yes Wound Bed Granulation Amount: Large (67-100%) Exposed Structure Granulation Quality: Red, Hyper-granulation Fascia Exposed: No Necrotic Amount: Small (1-33%) Fat Layer (Subcutaneous Tissue) Exposed: Yes Necrotic Quality: Adherent Slough Tendon Exposed: No Muscle Exposed: No Joint Exposed: No Bone Exposed: No Treatment Notes Wound #8 (Lower Leg) Wound Laterality: Left, Anterior Cleanser Wound Cleanser Discharge Instruction: Cleanse the wound with wound cleanser prior to applying a clean dressing using gauze sponges, not tissue or cotton balls. Peri-Wound Care Sween Lotion (Moisturizing lotion) Discharge Instruction: Apply moisturizing lotion to both legs Topical Primary Dressing KerraCel Ag Gelling Fiber Dressing, 4x5 in (silver alginate) Discharge Instruction: Apply silver alginate to wound bed as instructed Secondary  Dressing Woven Gauze Sponge, Non-Sterile 4x4 in Discharge Instruction: Apply over primary dressing as directed. ABD Pad, 5x9 Discharge Instruction: Apply over primary dressing as directed. Secured With [employer], 4.5x3.1 (in/yd) Discharge Instruction: Secure with Kerlix as directed. Compression Wrap Compression Stockings Add-Ons Electronic Signature(s) Signed: 12/29/2019 5:24:10 PM By: Levan Hurst RN, BSN Entered By: Levan Hurst on 12/25/2019 10:17:31 -------------------------------------------------------------------------------- Vitals Details Patient Name: Date of Service: [name], CLIFFO RD 12/25/2019 9:30 A M Medical Record Number: [id number] Patient Account Number: [id number] Date of Birth/Sex: Treating RN: 1945/09/17 (74 y.o. [name] Primary Care Masen Salvas: Theodora Blow, MIKE Other Clinician: Referring Aydia Maj: Treating Hillman Attig/Extender: Joanne Chars, MIKE Weeks in Treatment: 103 Vital Signs Time Taken: 10:12 Temperature (F): 98.8 Height (in): 67 Pulse (bpm): 94 Weight (lbs): 130 Respiratory Rate (breaths/min): 18 Body Mass Index (BMI): 20.4 Blood Pressure (mmHg): 168/96 Reference Range: 80 - 120 mg / dl Electronic Signature(s) Signed: 12/29/2019 5:24:10 PM By: Levan Hurst RN, BSN Entered By: Levan Hurst on 12/25/2019 10:13:34

## 2020-01-11 ENCOUNTER — Inpatient Hospital Stay (HOSPITAL_COMMUNITY)
Admission: EM | Admit: 2020-01-11 | Discharge: 2020-01-13 | DRG: 871 | Disposition: A | Discharge status: Home / Self Care | Payer: Medicare Other | Attending: Family Medicine | Admitting: Family Medicine

## 2020-01-11 ENCOUNTER — Other Ambulatory Visit: Payer: Self-pay

## 2020-01-11 ENCOUNTER — Emergency Department (HOSPITAL_COMMUNITY): Payer: Medicare Other

## 2020-01-11 DIAGNOSIS — R532 Functional quadriplegia: Secondary | ICD-10-CM | POA: Diagnosis present

## 2020-01-11 DIAGNOSIS — N390 Urinary tract infection, site not specified: Secondary | ICD-10-CM | POA: Diagnosis present

## 2020-01-11 DIAGNOSIS — L03116 Cellulitis of left lower limb: Secondary | ICD-10-CM | POA: Diagnosis present

## 2020-01-11 DIAGNOSIS — L0390 Cellulitis, unspecified: Secondary | ICD-10-CM | POA: Diagnosis present

## 2020-01-11 DIAGNOSIS — Z888 Allergy status to other drugs, medicaments and biological substances status: Secondary | ICD-10-CM

## 2020-01-11 DIAGNOSIS — E876 Hypokalemia: Secondary | ICD-10-CM | POA: Diagnosis present

## 2020-01-11 DIAGNOSIS — E785 Hyperlipidemia, unspecified: Secondary | ICD-10-CM | POA: Diagnosis present

## 2020-01-11 DIAGNOSIS — Z833 Family history of diabetes mellitus: Secondary | ICD-10-CM

## 2020-01-11 DIAGNOSIS — T07XXXA Unspecified multiple injuries, initial encounter: Secondary | ICD-10-CM | POA: Diagnosis present

## 2020-01-11 DIAGNOSIS — E119 Type 2 diabetes mellitus without complications: Secondary | ICD-10-CM

## 2020-01-11 DIAGNOSIS — Z7984 Long term (current) use of oral hypoglycemic drugs: Secondary | ICD-10-CM

## 2020-01-11 DIAGNOSIS — A419 Sepsis, unspecified organism: Principal | ICD-10-CM | POA: Diagnosis present

## 2020-01-11 DIAGNOSIS — E872 Acidosis: Secondary | ICD-10-CM | POA: Diagnosis present

## 2020-01-11 DIAGNOSIS — L089 Local infection of the skin and subcutaneous tissue, unspecified: Secondary | ICD-10-CM

## 2020-01-11 DIAGNOSIS — I160 Hypertensive urgency: Secondary | ICD-10-CM | POA: Diagnosis present

## 2020-01-11 DIAGNOSIS — T148XXA Other injury of unspecified body region, initial encounter: Secondary | ICD-10-CM | POA: Diagnosis not present

## 2020-01-11 DIAGNOSIS — Z7982 Long term (current) use of aspirin: Secondary | ICD-10-CM

## 2020-01-11 DIAGNOSIS — D509 Iron deficiency anemia, unspecified: Secondary | ICD-10-CM | POA: Diagnosis present

## 2020-01-11 DIAGNOSIS — L03115 Cellulitis of right lower limb: Secondary | ICD-10-CM | POA: Diagnosis present

## 2020-01-11 DIAGNOSIS — Z87891 Personal history of nicotine dependence: Secondary | ICD-10-CM

## 2020-01-11 DIAGNOSIS — U071 COVID-19: Secondary | ICD-10-CM | POA: Diagnosis present

## 2020-01-11 DIAGNOSIS — L03113 Cellulitis of right upper limb: Secondary | ICD-10-CM | POA: Diagnosis present

## 2020-01-11 DIAGNOSIS — Z79899 Other long term (current) drug therapy: Secondary | ICD-10-CM

## 2020-01-11 DIAGNOSIS — I10 Essential (primary) hypertension: Secondary | ICD-10-CM | POA: Diagnosis present

## 2020-01-11 DIAGNOSIS — E860 Dehydration: Secondary | ICD-10-CM | POA: Diagnosis present

## 2020-01-11 DIAGNOSIS — Z7401 Bed confinement status: Secondary | ICD-10-CM

## 2020-01-11 DIAGNOSIS — Z23 Encounter for immunization: Secondary | ICD-10-CM

## 2020-01-11 LAB — URINALYSIS, ROUTINE W REFLEX MICROSCOPIC
Bilirubin Urine: NEGATIVE
Glucose, UA: NEGATIVE mg/dL
Ketones, ur: NEGATIVE mg/dL
Nitrite: NEGATIVE
Protein, ur: 100 mg/dL — AB
Specific Gravity, Urine: 1.01 (ref 1.005–1.030)
WBC, UA: >50 WBC/hpf — ABNORMAL HIGH (ref 0–5)
pH: 6 (ref 5.0–8.0)

## 2020-01-11 LAB — I-STAT VENOUS BLOOD GAS, ED
Acid-Base Excess: 1 mmol/L (ref 0.0–2.0)
Bicarbonate: 24.4 mmol/L (ref 20.0–28.0)
Calcium, Ion: 1.02 mmol/L — ABNORMAL LOW (ref 1.15–1.40)
HCT: 30 % — ABNORMAL LOW (ref 39.0–52.0)
Hemoglobin: 10.2 g/dL — ABNORMAL LOW (ref 13.0–17.0)
O2 Saturation: 97 %
Potassium: 2.8 mmol/L — ABNORMAL LOW (ref 3.5–5.1)
Sodium: 139 mmol/L (ref 135–145)
TCO2: 25 mmol/L (ref 22–32)
pCO2, Ven: 34.1 mmHg — ABNORMAL LOW (ref 44.0–60.0)
pH, Ven: 7.463 — ABNORMAL HIGH (ref 7.250–7.430)
pO2, Ven: 81 mmHg — ABNORMAL HIGH (ref 32.0–45.0)

## 2020-01-11 LAB — CBC WITH DIFFERENTIAL/PLATELET
Abs Immature Granulocytes: 0.06 10*3/uL (ref 0.00–0.07)
Basophils Absolute: 0 10*3/uL (ref 0.0–0.1)
Basophils Relative: 0 %
Eosinophils Absolute: 0 10*3/uL (ref 0.0–0.5)
Eosinophils Relative: 0 %
HCT: 32.2 % — ABNORMAL LOW (ref 39.0–52.0)
Hemoglobin: 10.1 g/dL — ABNORMAL LOW (ref 13.0–17.0)
Immature Granulocytes: 1 %
Lymphocytes Relative: 11 %
Lymphs Abs: 1 10*3/uL (ref 0.7–4.0)
MCH: 30.5 pg (ref 26.0–34.0)
MCHC: 31.4 g/dL (ref 30.0–36.0)
MCV: 97.3 fL (ref 80.0–100.0)
Monocytes Absolute: 0.5 10*3/uL (ref 0.1–1.0)
Monocytes Relative: 5 %
Neutro Abs: 7.3 10*3/uL (ref 1.7–7.7)
Neutrophils Relative %: 83 %
Platelets: 272 10*3/uL (ref 150–400)
RBC: 3.31 MIL/uL — ABNORMAL LOW (ref 4.22–5.81)
RDW: 14.4 % (ref 11.5–15.5)
WBC: 8.9 10*3/uL (ref 4.0–10.5)
nRBC: 0 % (ref 0.0–0.2)

## 2020-01-11 LAB — RESP PANEL BY RT-PCR (FLU A&B, COVID) ARPGX2
Influenza A by PCR: NEGATIVE
Influenza B by PCR: NEGATIVE
SARS Coronavirus 2 by RT PCR: POSITIVE — AB

## 2020-01-11 LAB — PROTIME-INR
INR: 1 (ref 0.8–1.2)
Prothrombin Time: 12.8 seconds (ref 11.4–15.2)

## 2020-01-11 LAB — LACTIC ACID, PLASMA: Lactic Acid, Venous: 1.9 mmol/L (ref 0.5–1.9)

## 2020-01-11 IMAGING — CR DG CHEST 2V
2 series · 2 of 2 positions shown · non-contrast
Comparison: [DATE]

CLINICAL DATA: Purulent drainage from extremities, sepsis

EXAM:
CHEST - 2 VIEW

[chest lat]
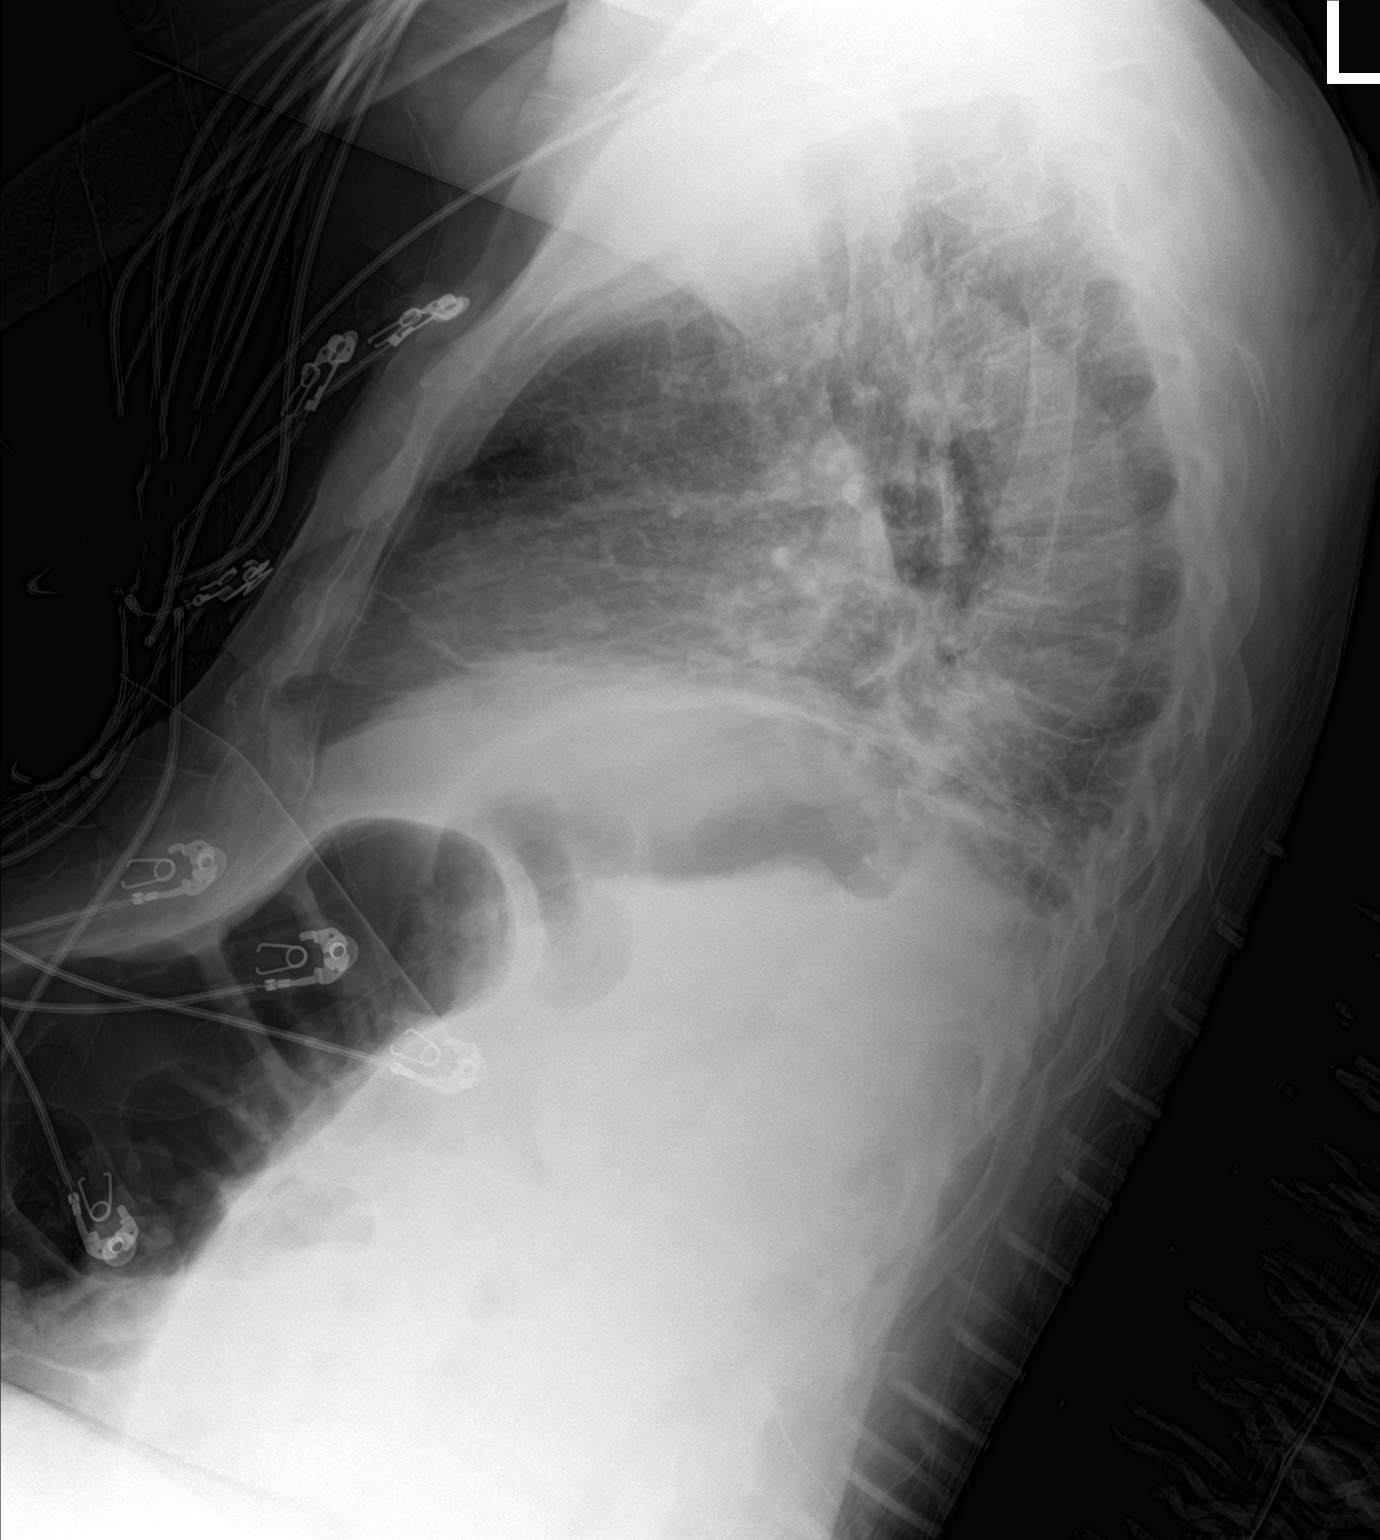

[chest ap]
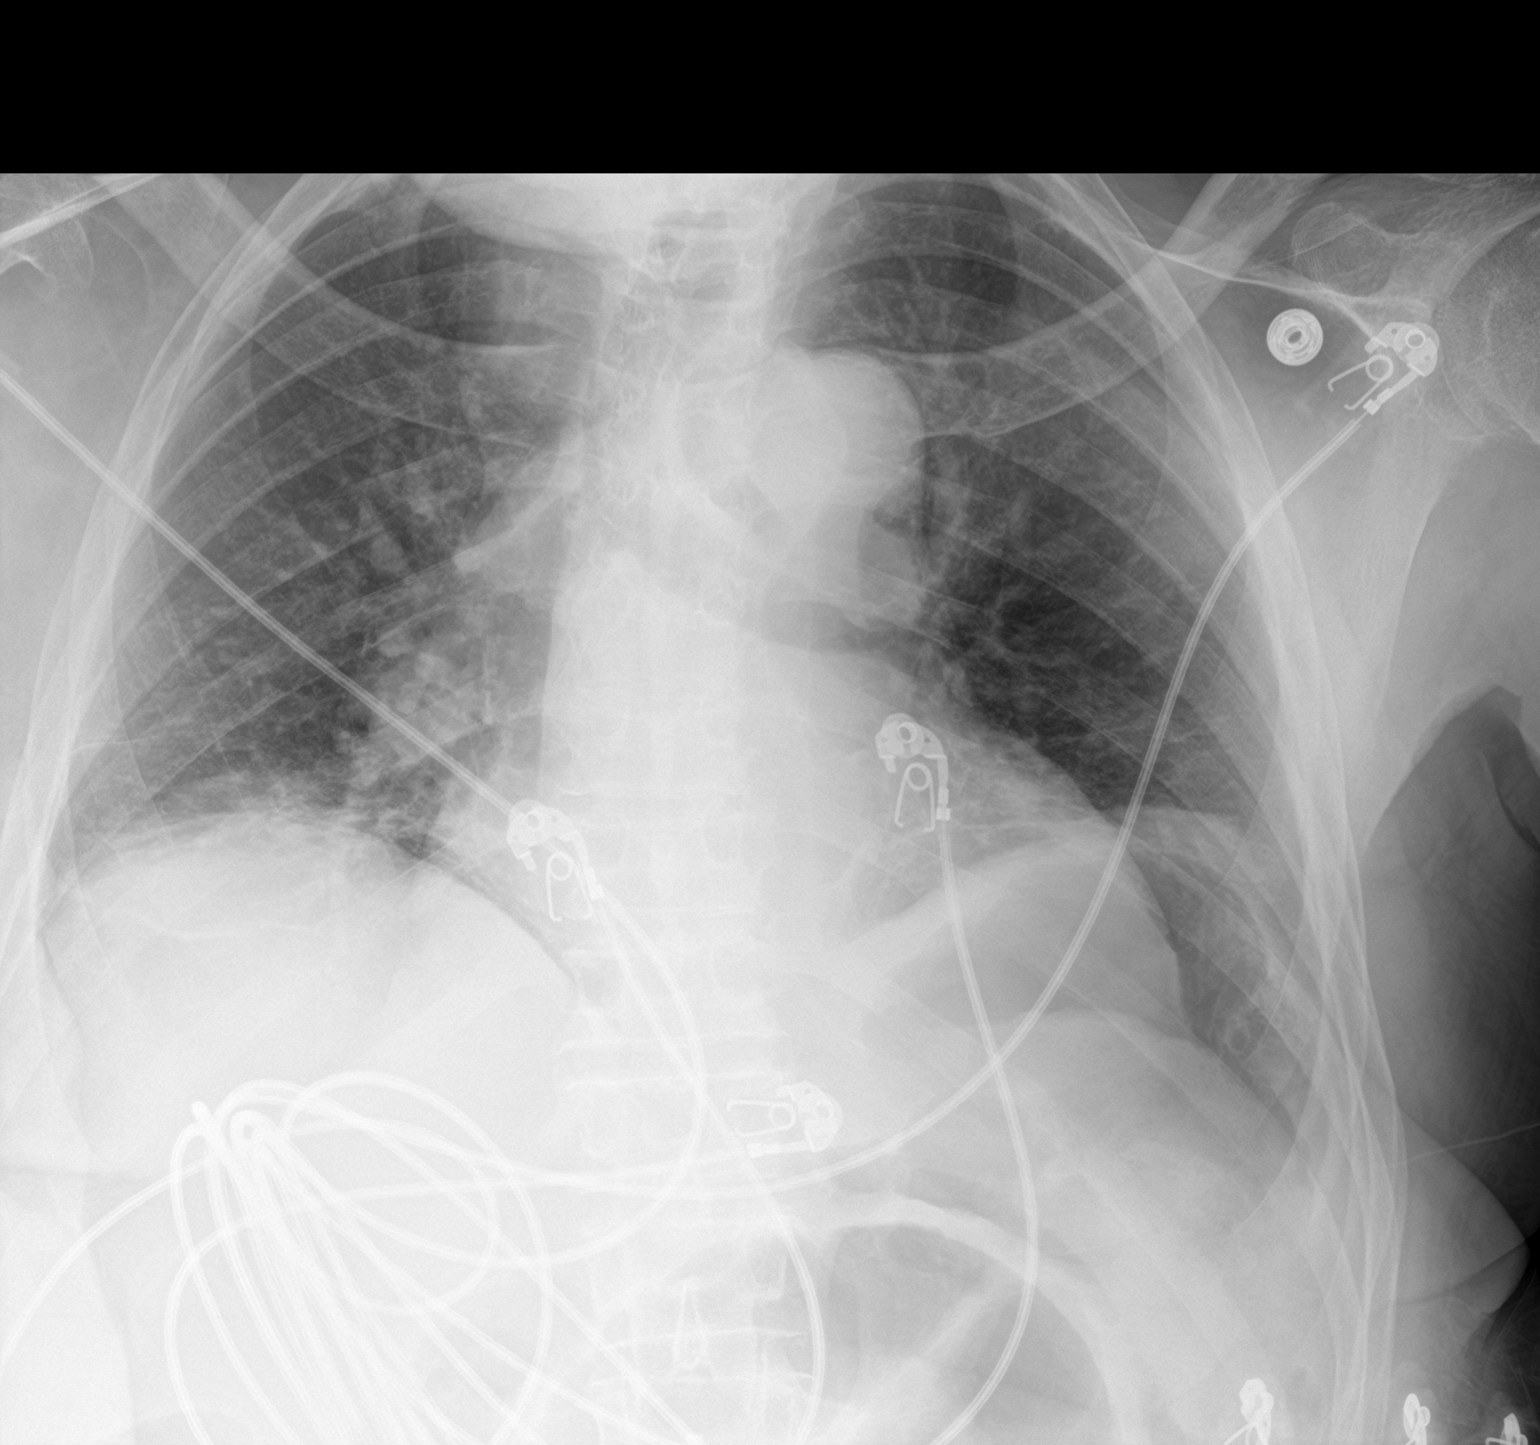

[2 of 2 positions shown; findings below may reference images not displayed]

FINDINGS: Frontal and lateral views of the chest demonstrate an unremarkable
cardiac silhouette. The lung volumes are diminished, with
subsegmental atelectasis noted at the lung bases. No airspace
disease, effusion, or pneumothorax. No acute bony abnormalities.
IMPRESSION: 1. Hypoventilatory changes, with bibasilar atelectasis. No acute
intrathoracic process.

## 2020-01-11 IMAGING — CR DG FOOT 2V*L*
2 series · 2 of 2 positions shown · non-contrast
Comparison: None.

CLINICAL DATA: Pus draining from left foot, sepsis

EXAM:
LEFT FOOT - 2 VIEW

[foot ap]
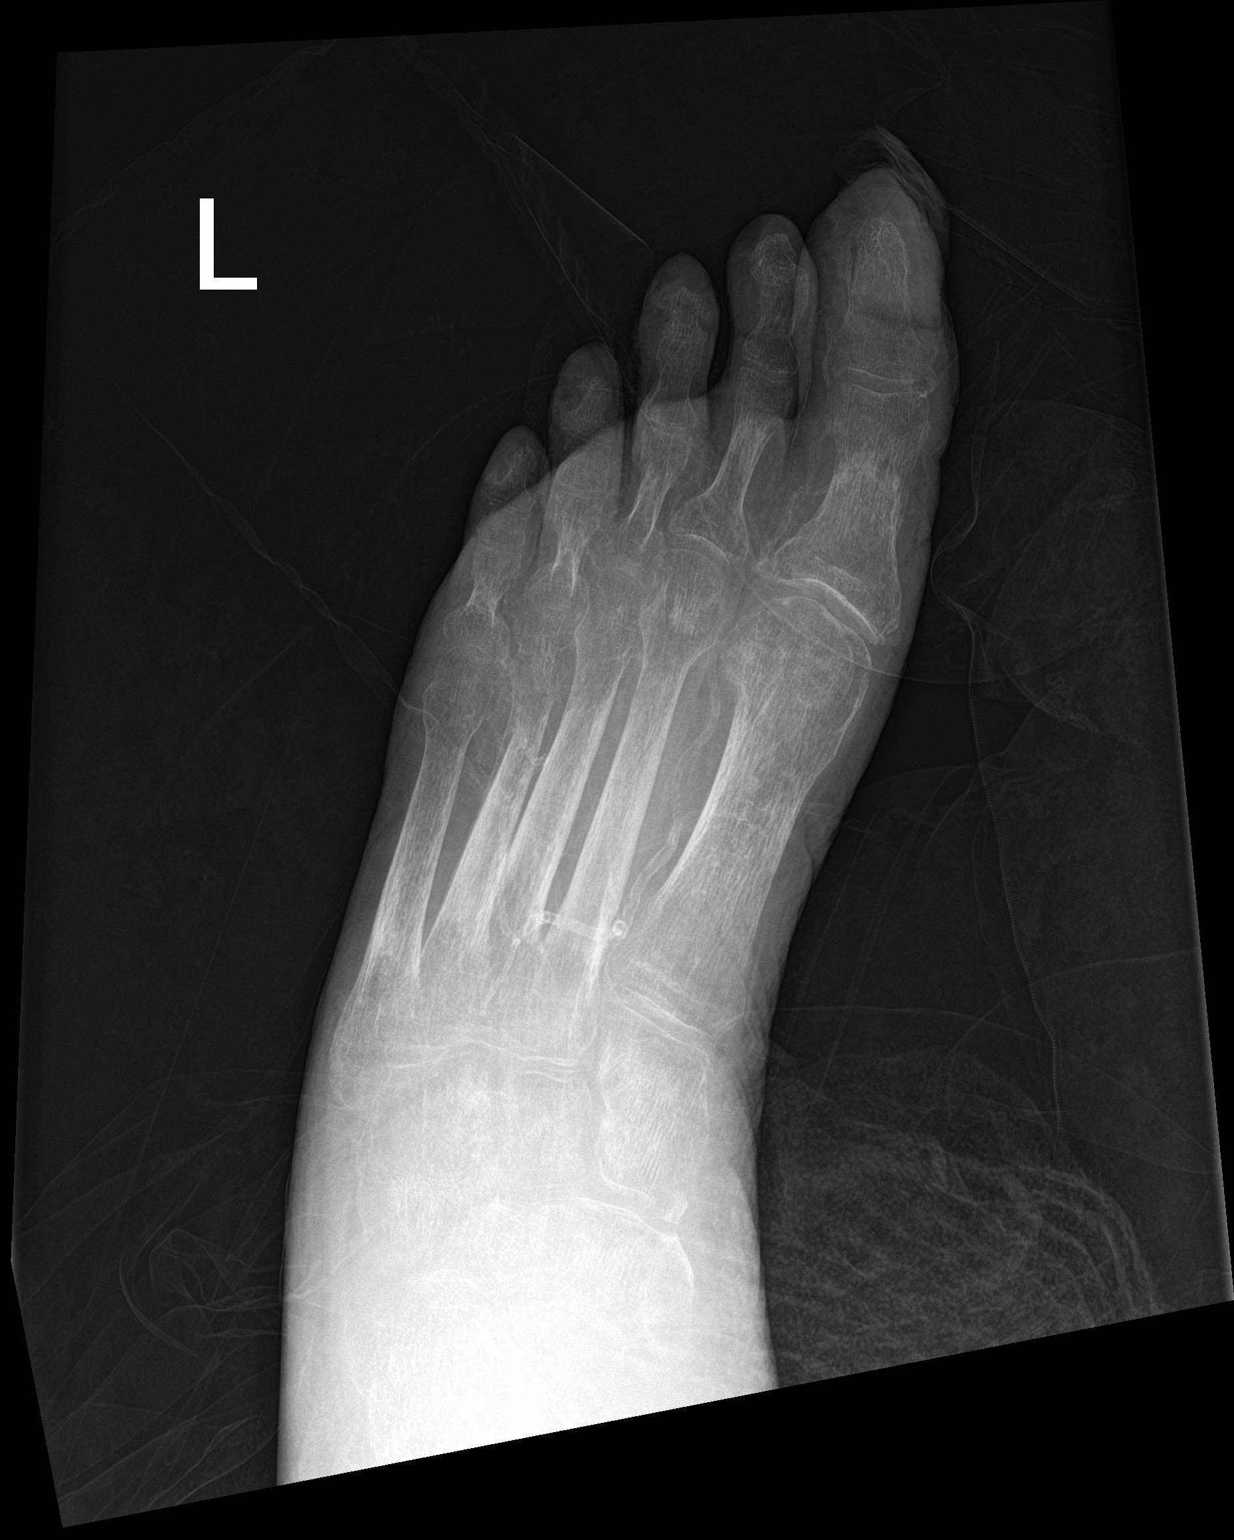

[foot lat]
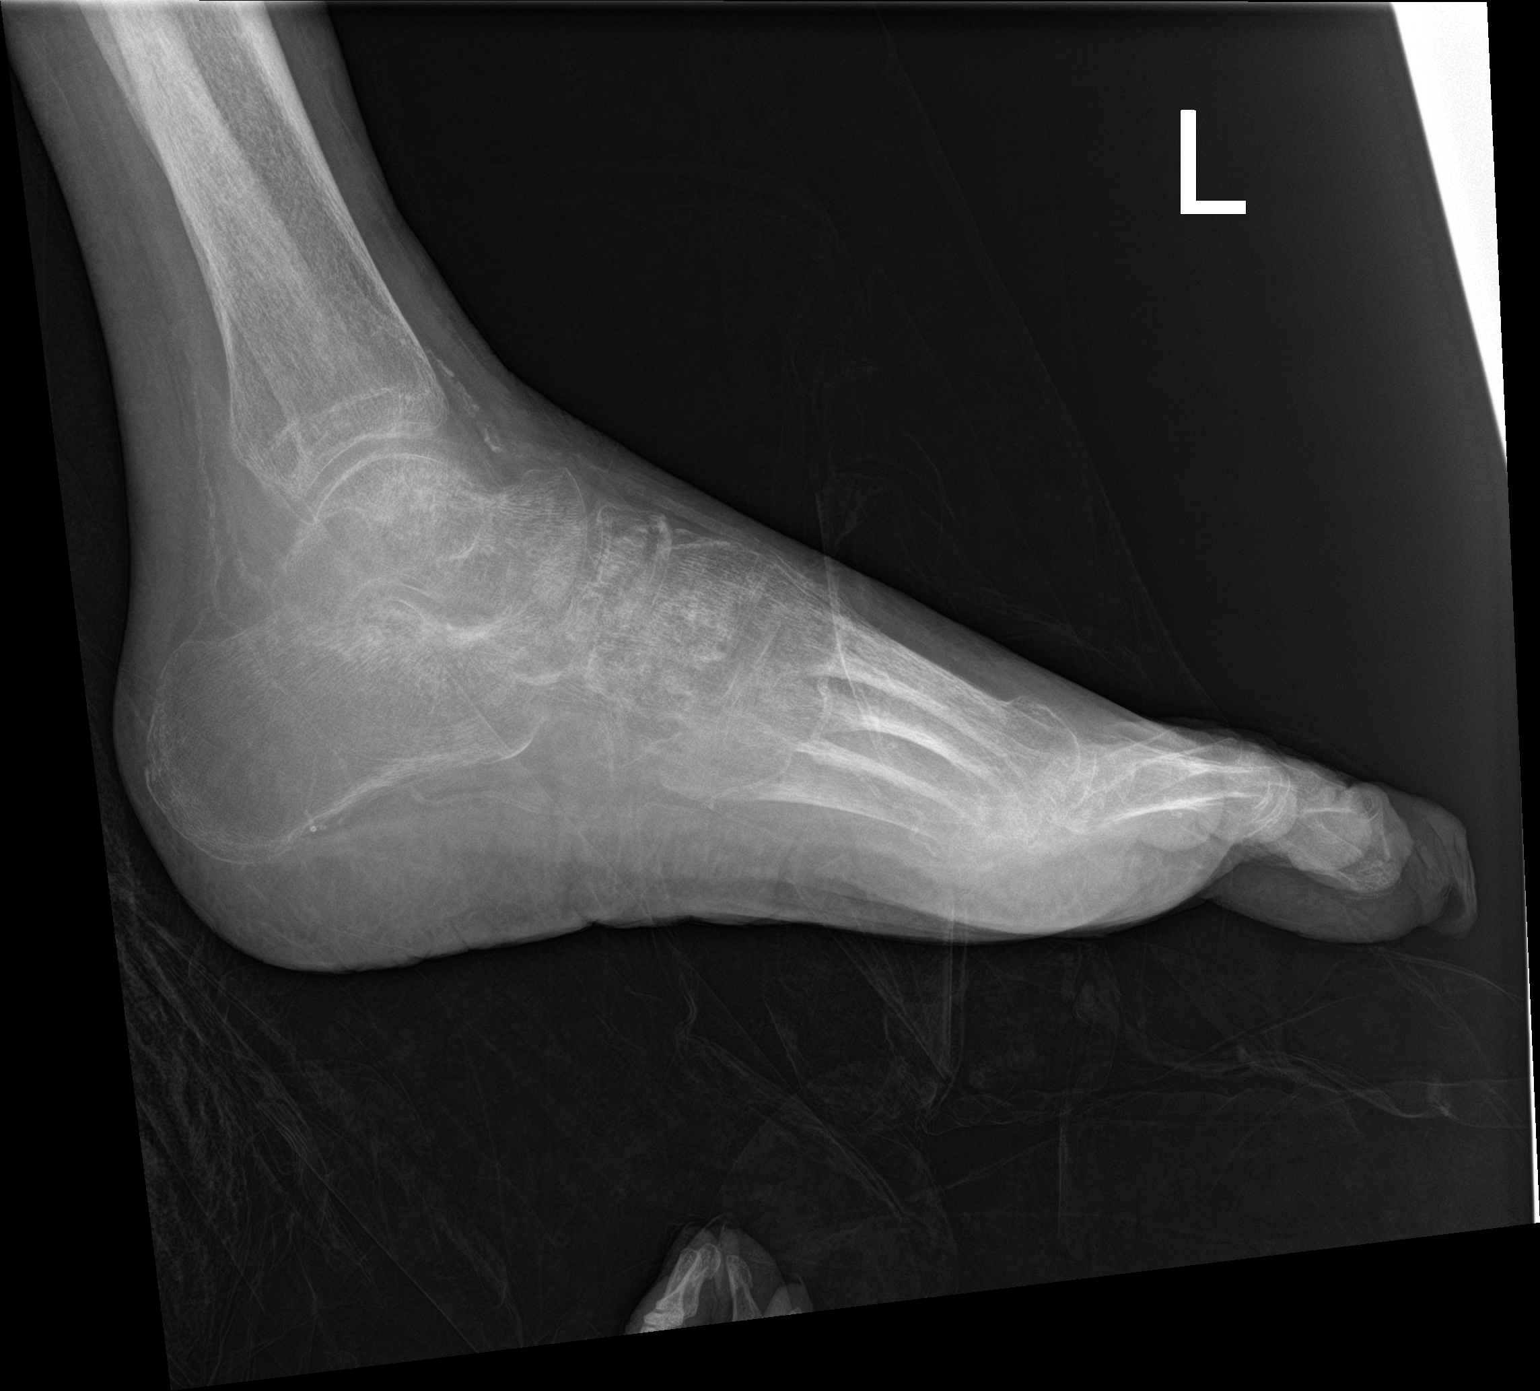

[2 of 2 positions shown; findings below may reference images not displayed]

FINDINGS: Frontal and lateral views of the left foot are obtained. The bones
are diffusely osteopenic. No acute or destructive bony lesions. No
radiographic evidence of osteomyelitis. Diffuse soft tissue edema,
with extensive vascular calcifications.
IMPRESSION: 1. Diffuse soft tissue edema.
2. Extensive osteopenia, with no acute or destructive bony lesions.

## 2020-01-11 IMAGING — CR DG HAND 2V*L*
2 series · 2 of 2 positions shown · non-contrast
Comparison: None.

CLINICAL DATA: Posture ending from left hand, suspected sepsis

EXAM:
LEFT HAND - 2 VIEW

[hand pa]
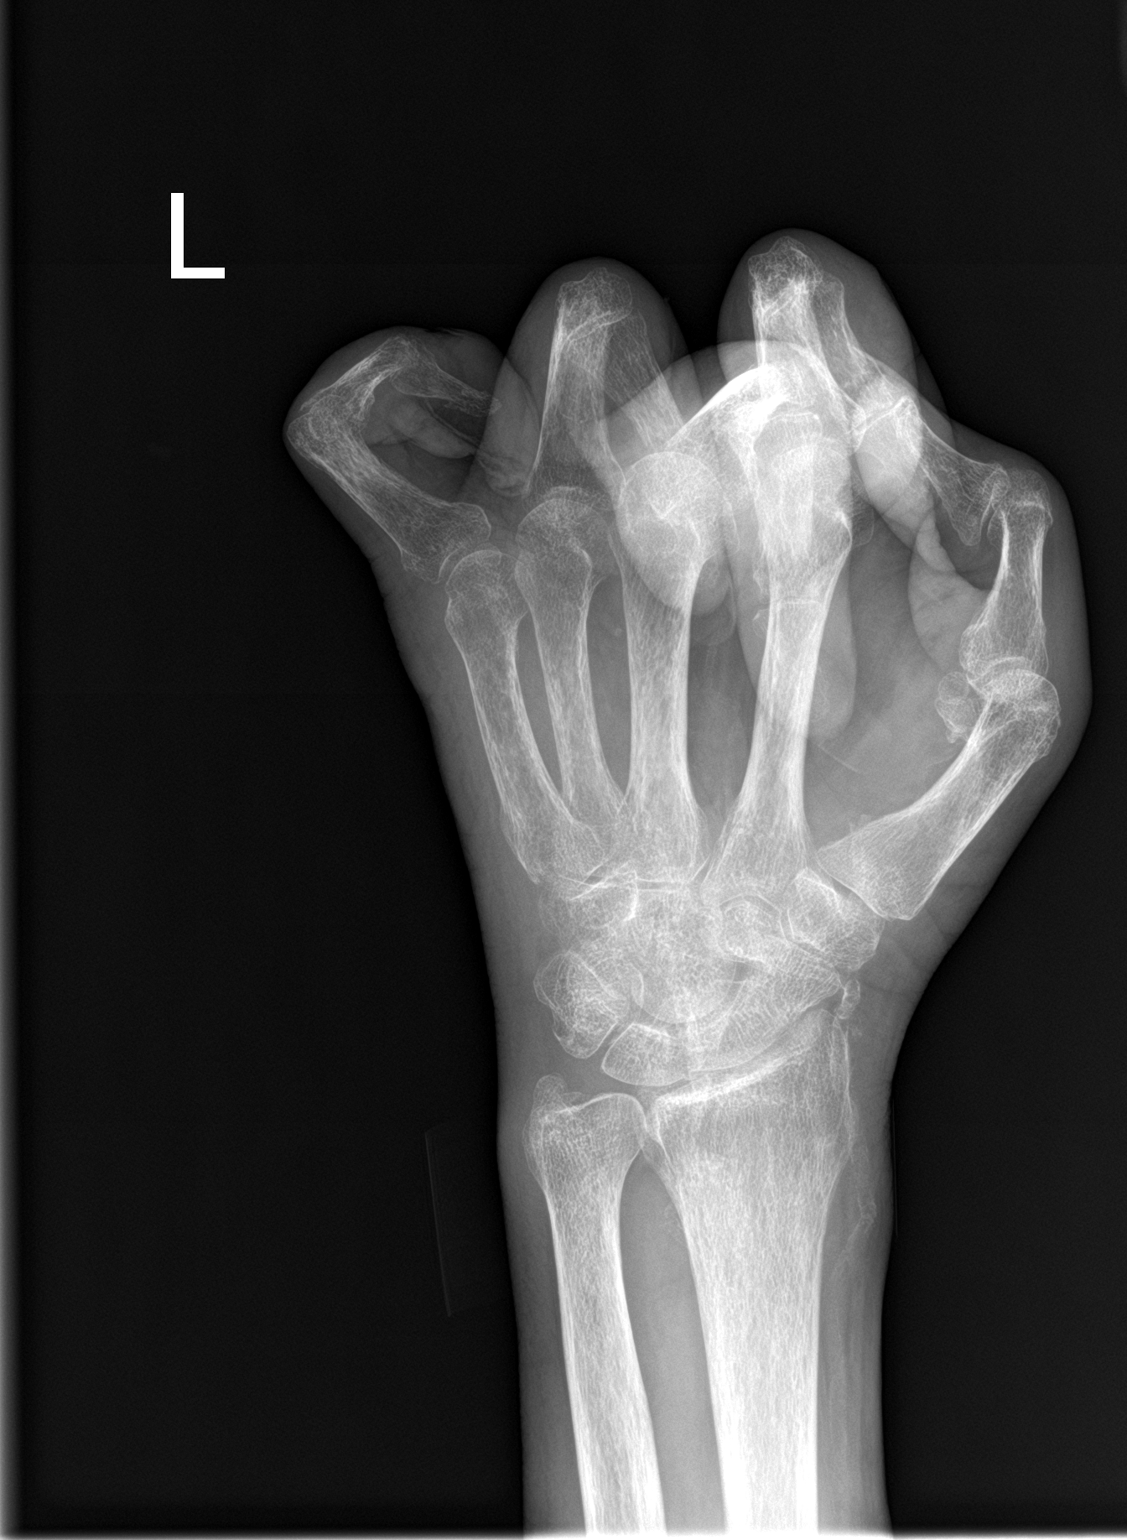

[hand lat]
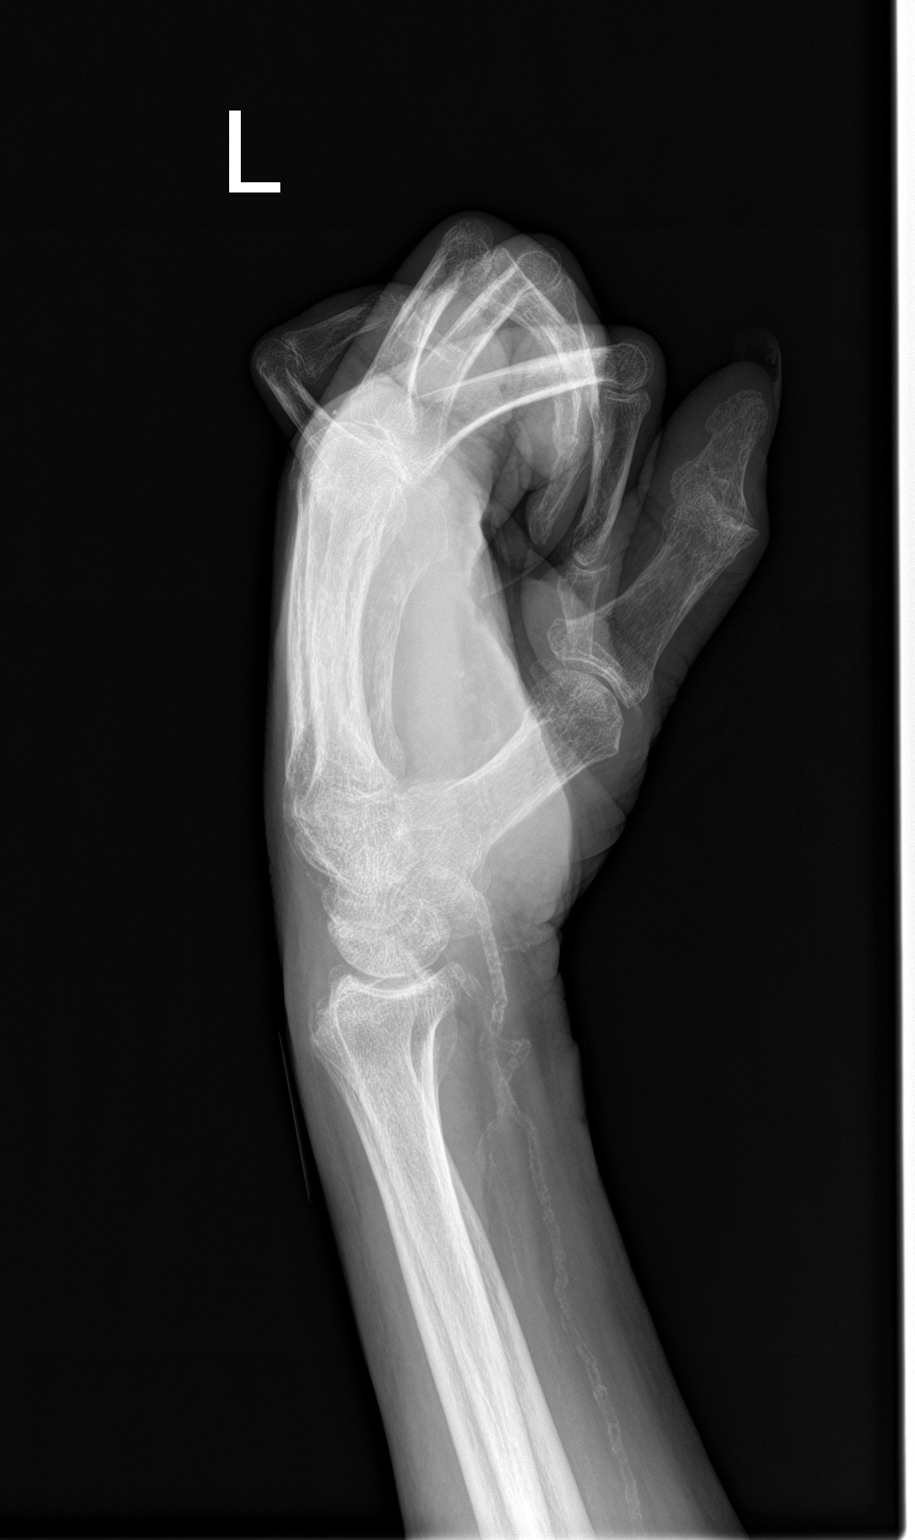

[2 of 2 positions shown; findings below may reference images not displayed]

FINDINGS: Frontal and lateral views of the left hand are obtained. Evaluation
is limited by flexion deformities of the digits. Bones are
osteopenic. There are no acute or destructive bony abnormalities. No
radiographic evidence of osteomyelitis. Vascular calcifications are
seen. Otherwise the soft tissues are unremarkable.
IMPRESSION: 1. Limited study due to flexion deformities of the digits.
2. No acute or destructive bony lesion.
3. Osteopenia.

## 2020-01-11 IMAGING — CR DG HAND 2V*R*
2 series · 2 of 2 positions shown · non-contrast
Comparison: [DATE]

CLINICAL DATA: Pus draining from right hand, sepsis

EXAM:
RIGHT HAND - 2 VIEW

[hand pa]
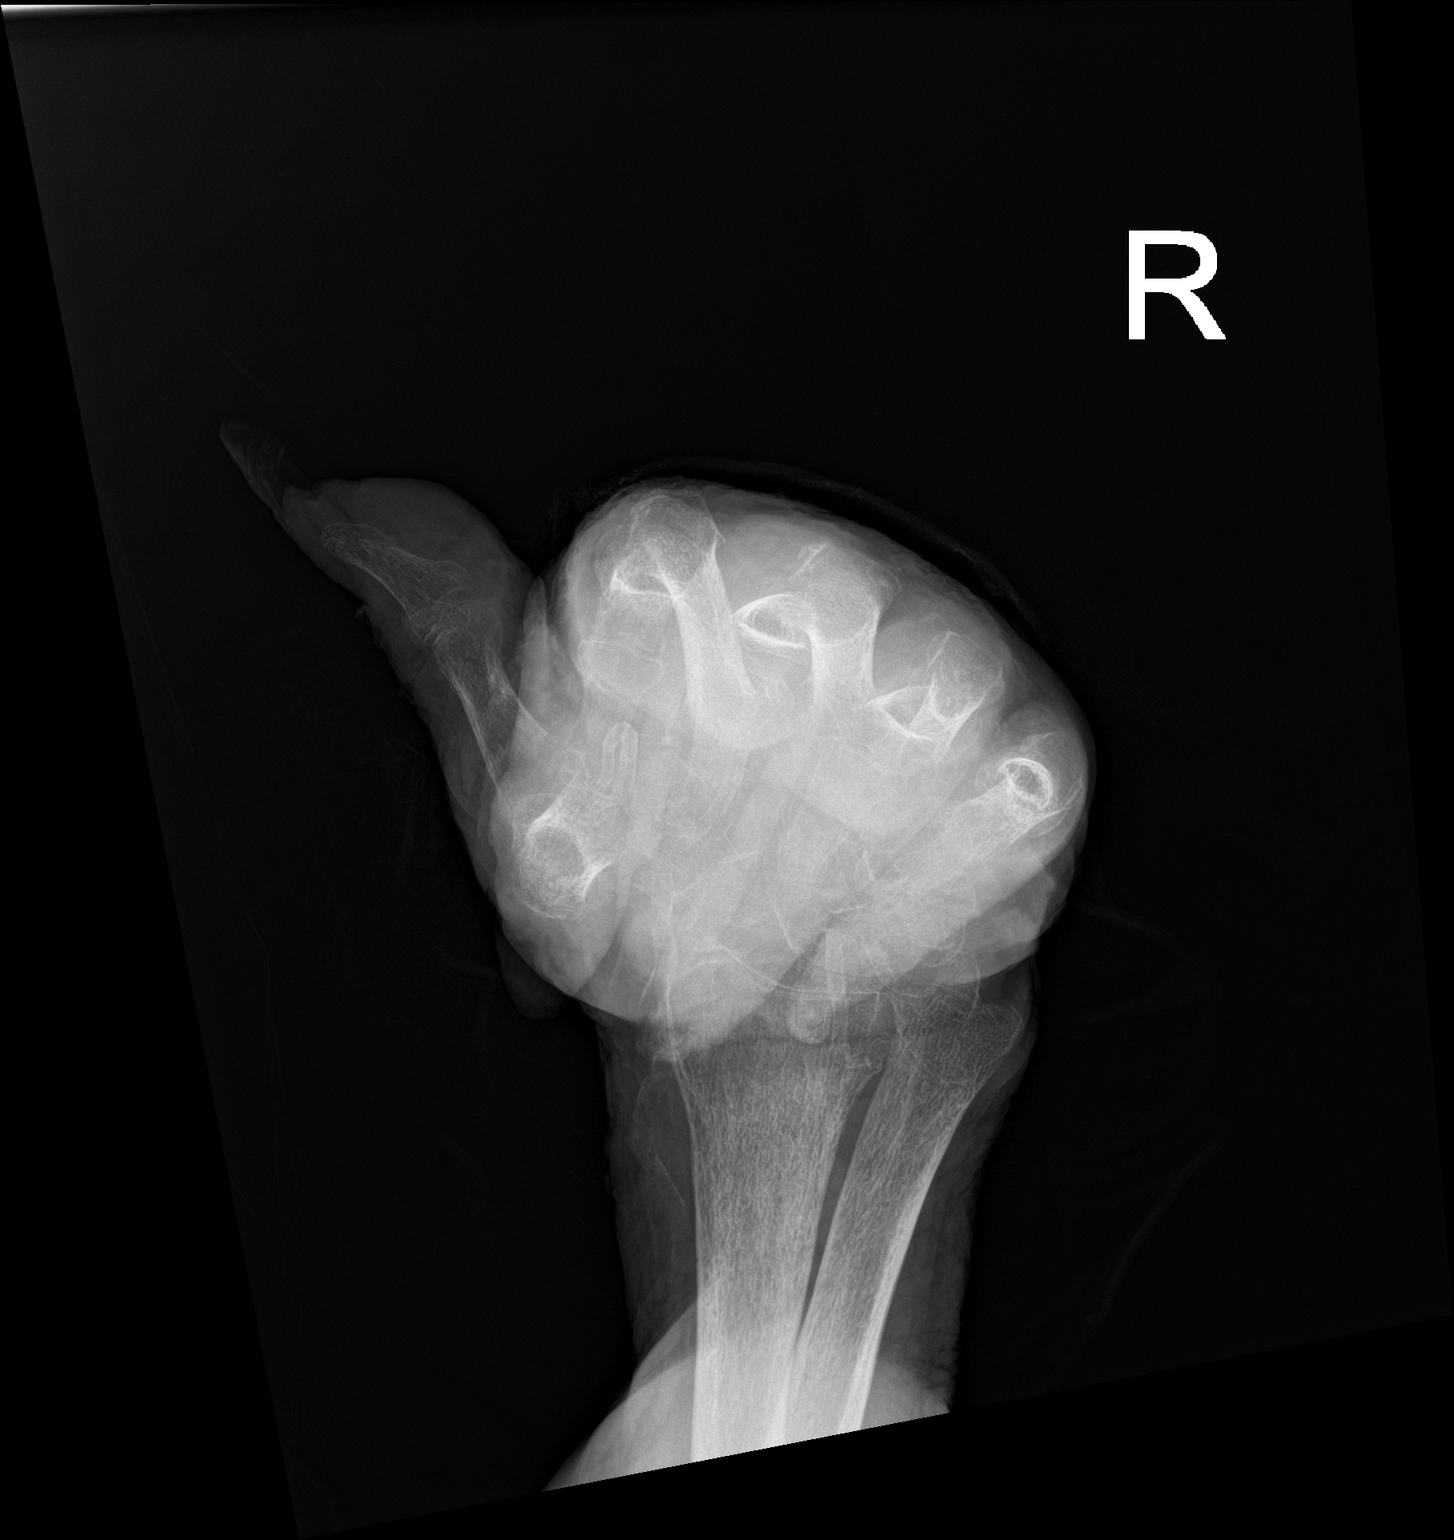

[hand lat]
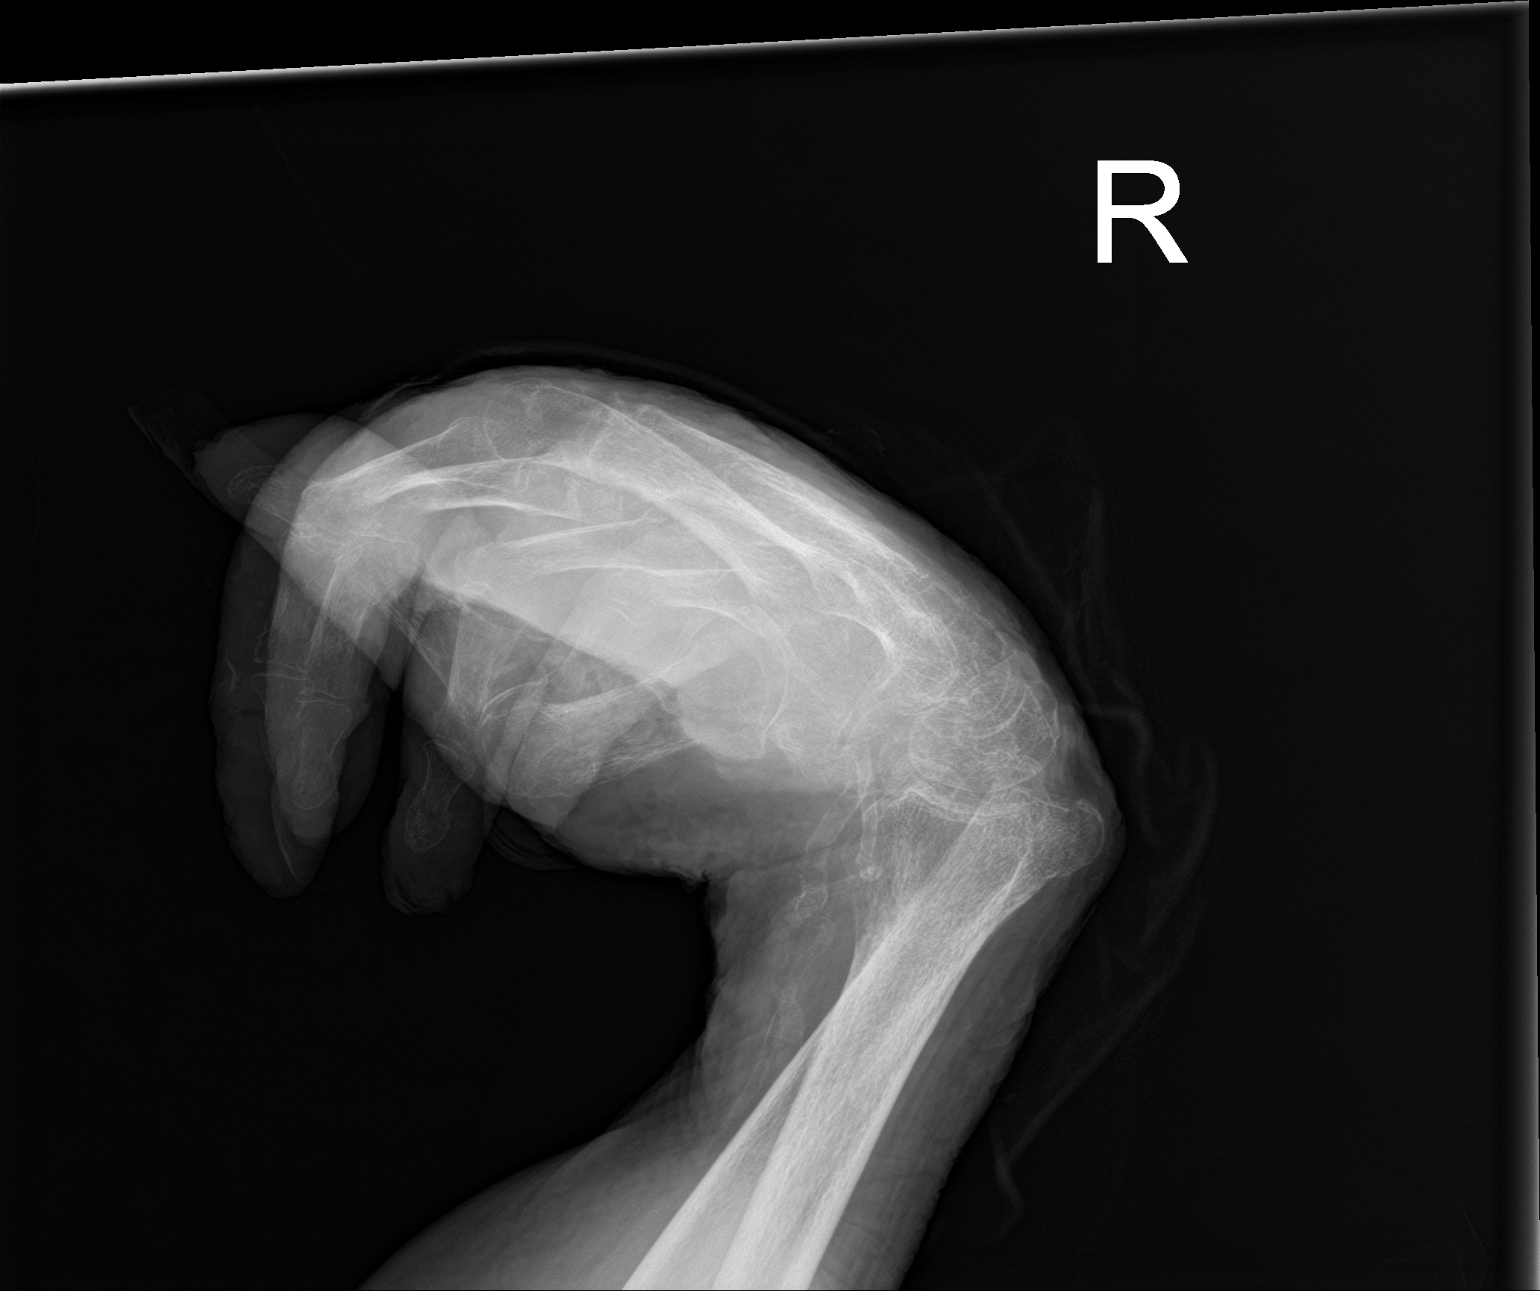

[2 of 2 positions shown; findings below may reference images not displayed]

FINDINGS: Frontal and lateral views of the right hand are obtained. Evaluation
is extremely limited due to flexion deformity. The bones are
diffusely osteopenic. There is subcutaneous gas and dorsal margin
distal aspect second digit. I do not see any gross bony destruction
on this limited evaluation.
IMPRESSION: 1. Subcutaneous gas and soft tissue swelling distal aspect second
digit. No radiographic evidence of osteomyelitis.
2. Osteopenia.
3. Limited study due to patient positioning and flexion deformity.

## 2020-01-11 IMAGING — CT CT HAND*R* W/O CM
4 of 5 series · 9 of 20 positions shown, 10 images · non-contrast
Comparison: None.

CLINICAL DATA: Swelling with pain and

EXAM:
CT OF THE RIGHT HAND WITHOUT CONTRAST
TECHNIQUE: Multidetector CT imaging of the right hand was performed according
to the standard protocol. Multiplanar CT image reconstructions were
also generated.

[Series 3: extremity bone- pt moved out of fov · axial · 0.41mm/px · z∈[+1042,+1138]mm · 3 of 96 slices shown, 4 images]
[im 24/96  soft-tissue]
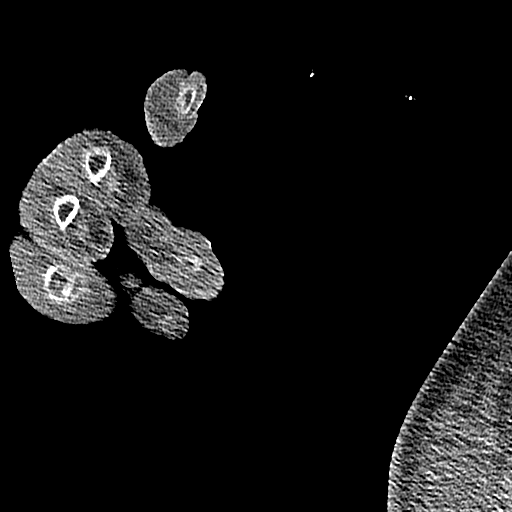
[im 24/96  bone]
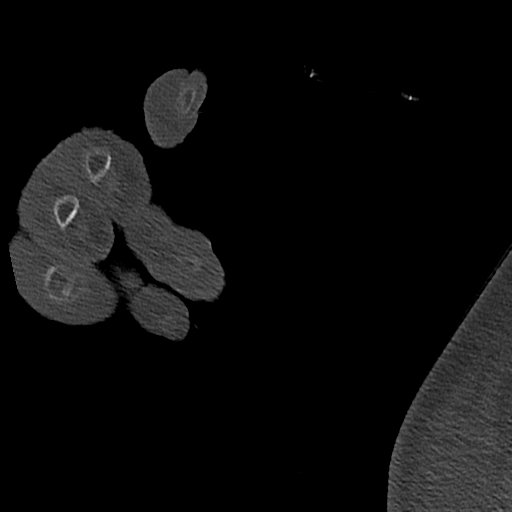
[im 48/96  bone]
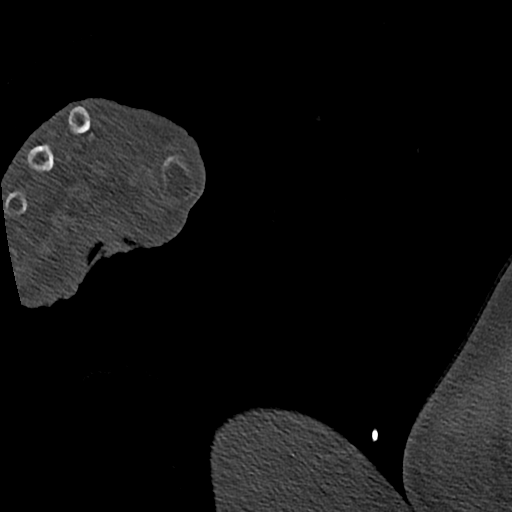
[im 72/96  bone]
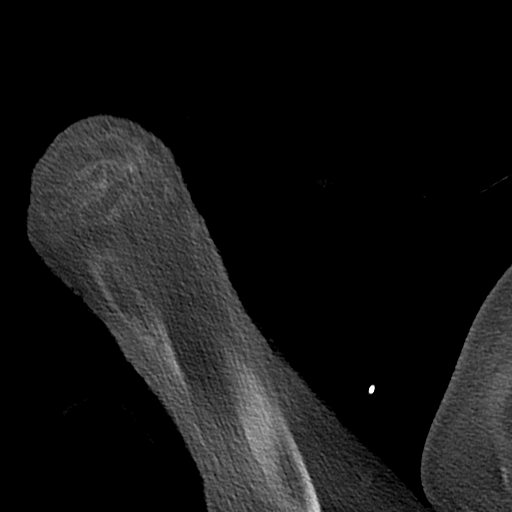

[Series 5: extremity soft tissue · axial · 0.37mm/px · z∈[+1040,+1142]mm · 3 of 103 slices shown]
[im 26/103  soft-tissue]
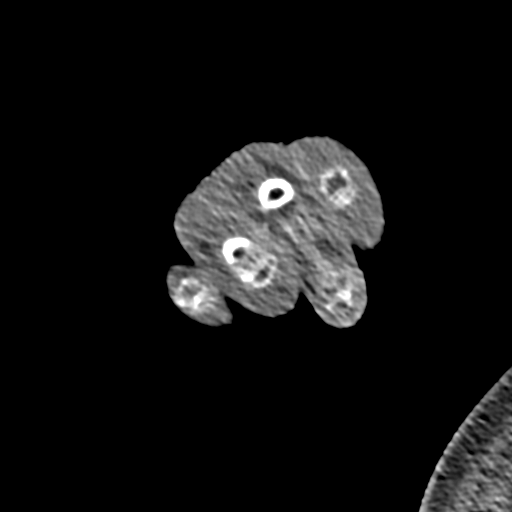
[im 52/103  soft-tissue]
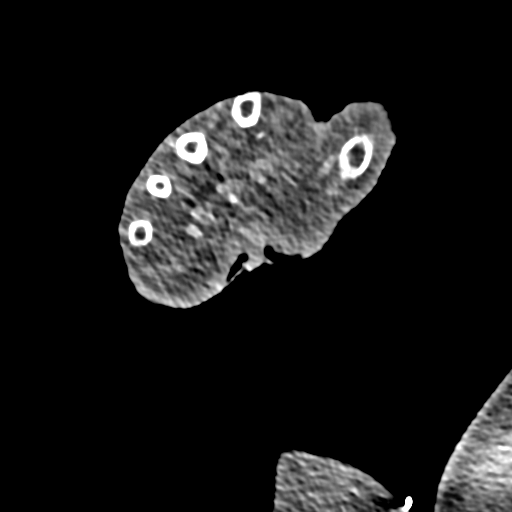
[im 77/103  soft-tissue]
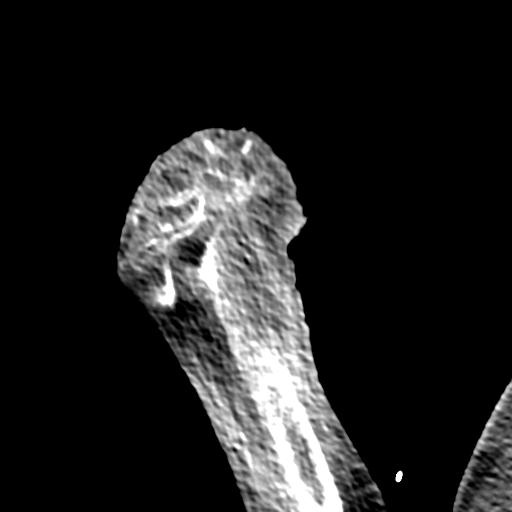

[Series 8: sag bone- hand · coronal · 0.35mm/px · 1 of 70 slices shown]
[im 35/70  bone]
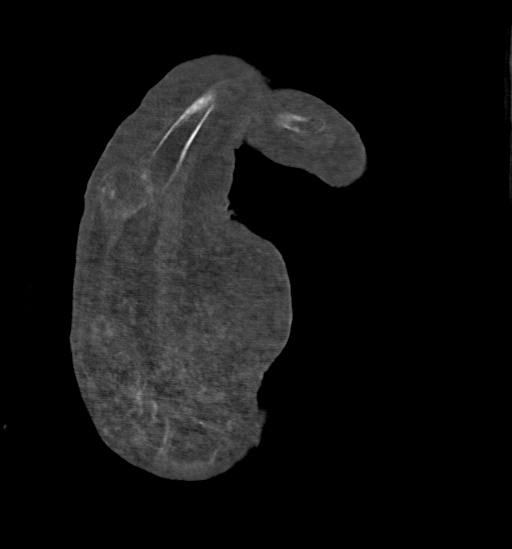

[Series 11: cor bone- fingers · axial · 0.40mm/px · z∈[+996,+1037]mm · 2 of 76 slices shown]
[im 26/76  bone]
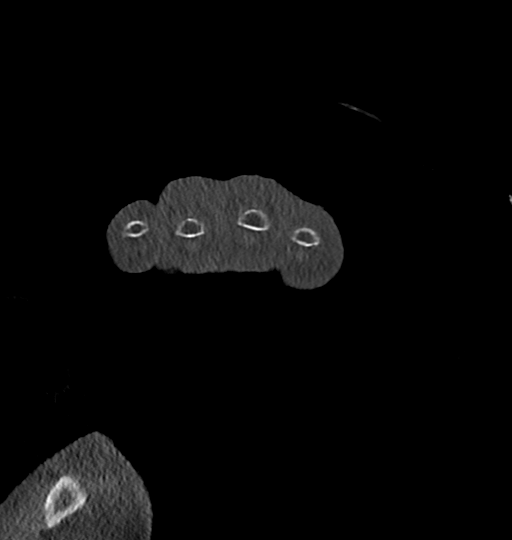
[im 51/76  bone]
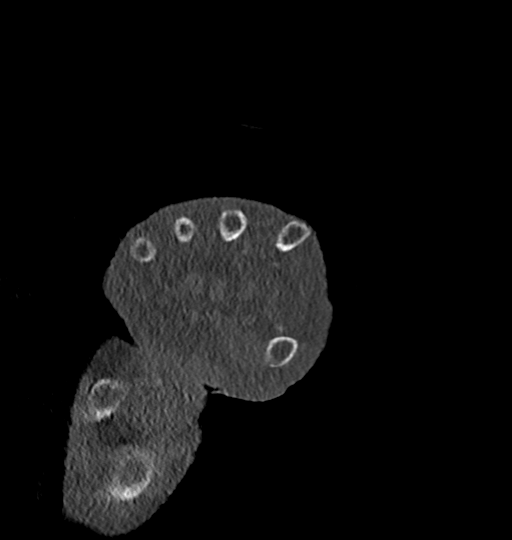

[9 of 20 positions shown; findings below may reference images not displayed]

FINDINGS: The study is extremely limited due to technique and patient
positioning.

Bones/Joint/Cartilage

No fracture or dislocation. There is diffuse osteopenia. No definite
areas of cortical destruction or periosteal reaction are seen.

Ligaments

Suboptimally assessed by CT.

Muscles and Tendons

The visualized portion of the muscles appear to be grossly intact
there is however atrophy seen within the muscles of the hand. The
visualized portion of the flexor extensor tendons appear to be
grossly intact.

Soft tissues

On the palmar surface of the hand there is area of superficial
ulceration with skin thickening. No definite loculated fluid
collection is seen.
IMPRESSION: Limited examination.

On the palmar surface of the hand area of superficial ulceration
with skin thickening. No definite loculated fluid collection.

Diffuse osteopenia, no definite acute osseous abnormality.

## 2020-01-11 IMAGING — CR DG FOOT 2V*R*
3 series · 3 of 3 positions shown · non-contrast
Comparison: [DATE]

CLINICAL DATA: Sepsis, pus draining from right foot

EXAM:
RIGHT FOOT - 2 VIEW

[foot ap]
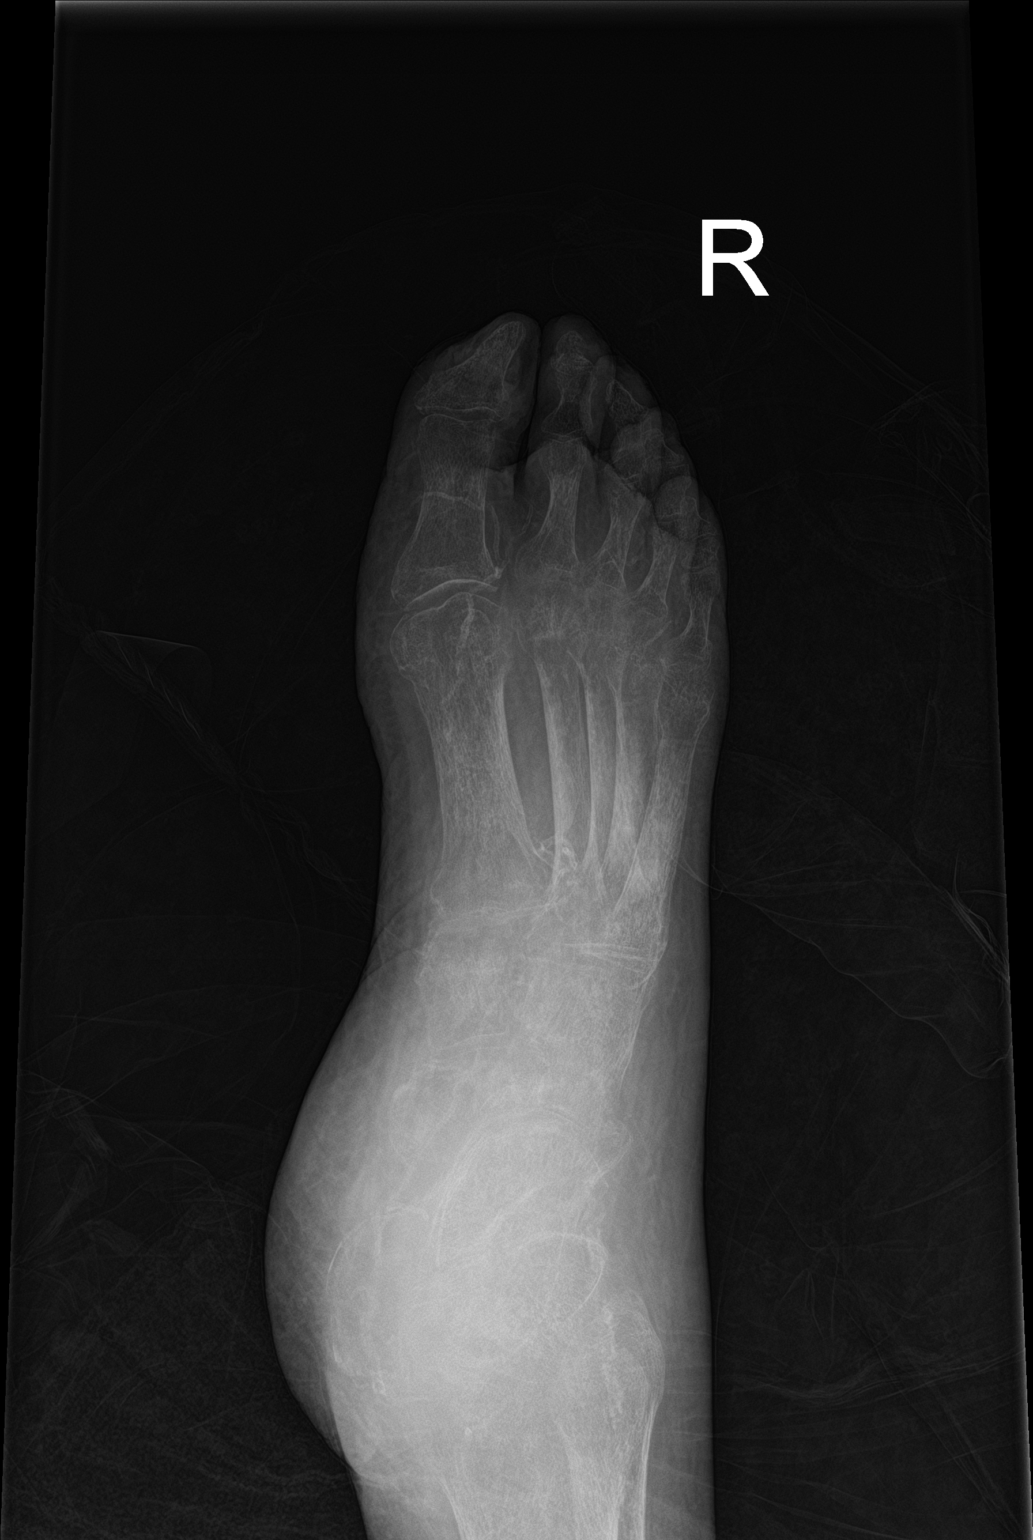

[foot lat (1 of 2)]
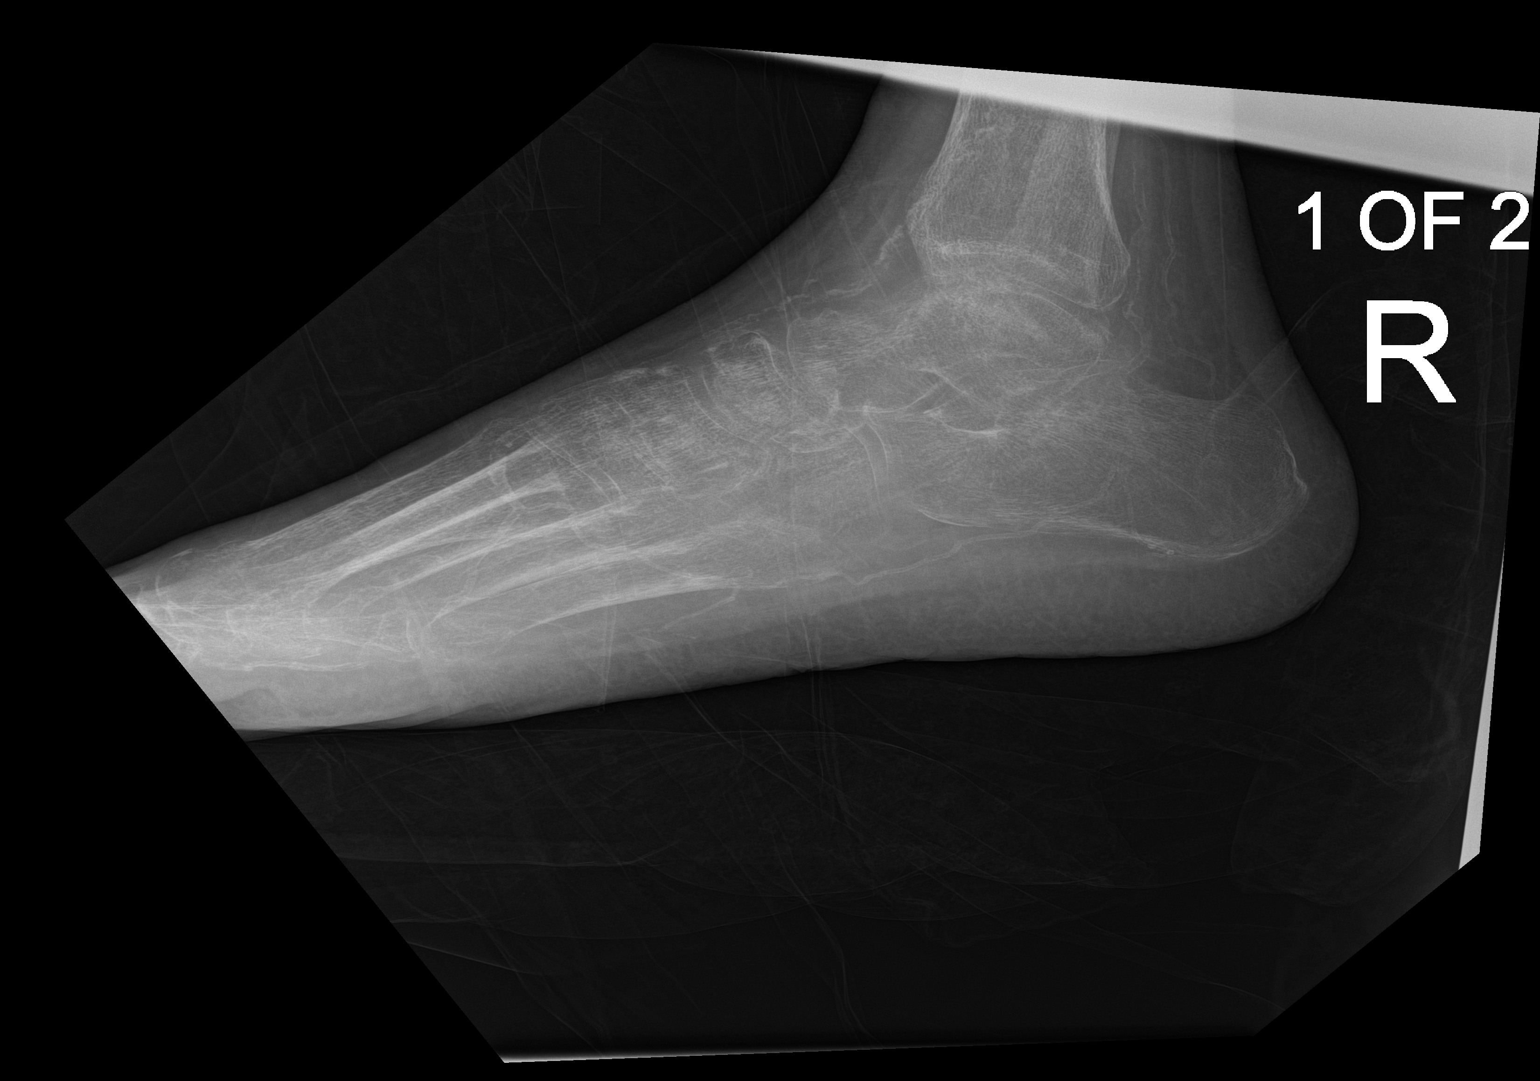

[foot lat (2 of 2)]
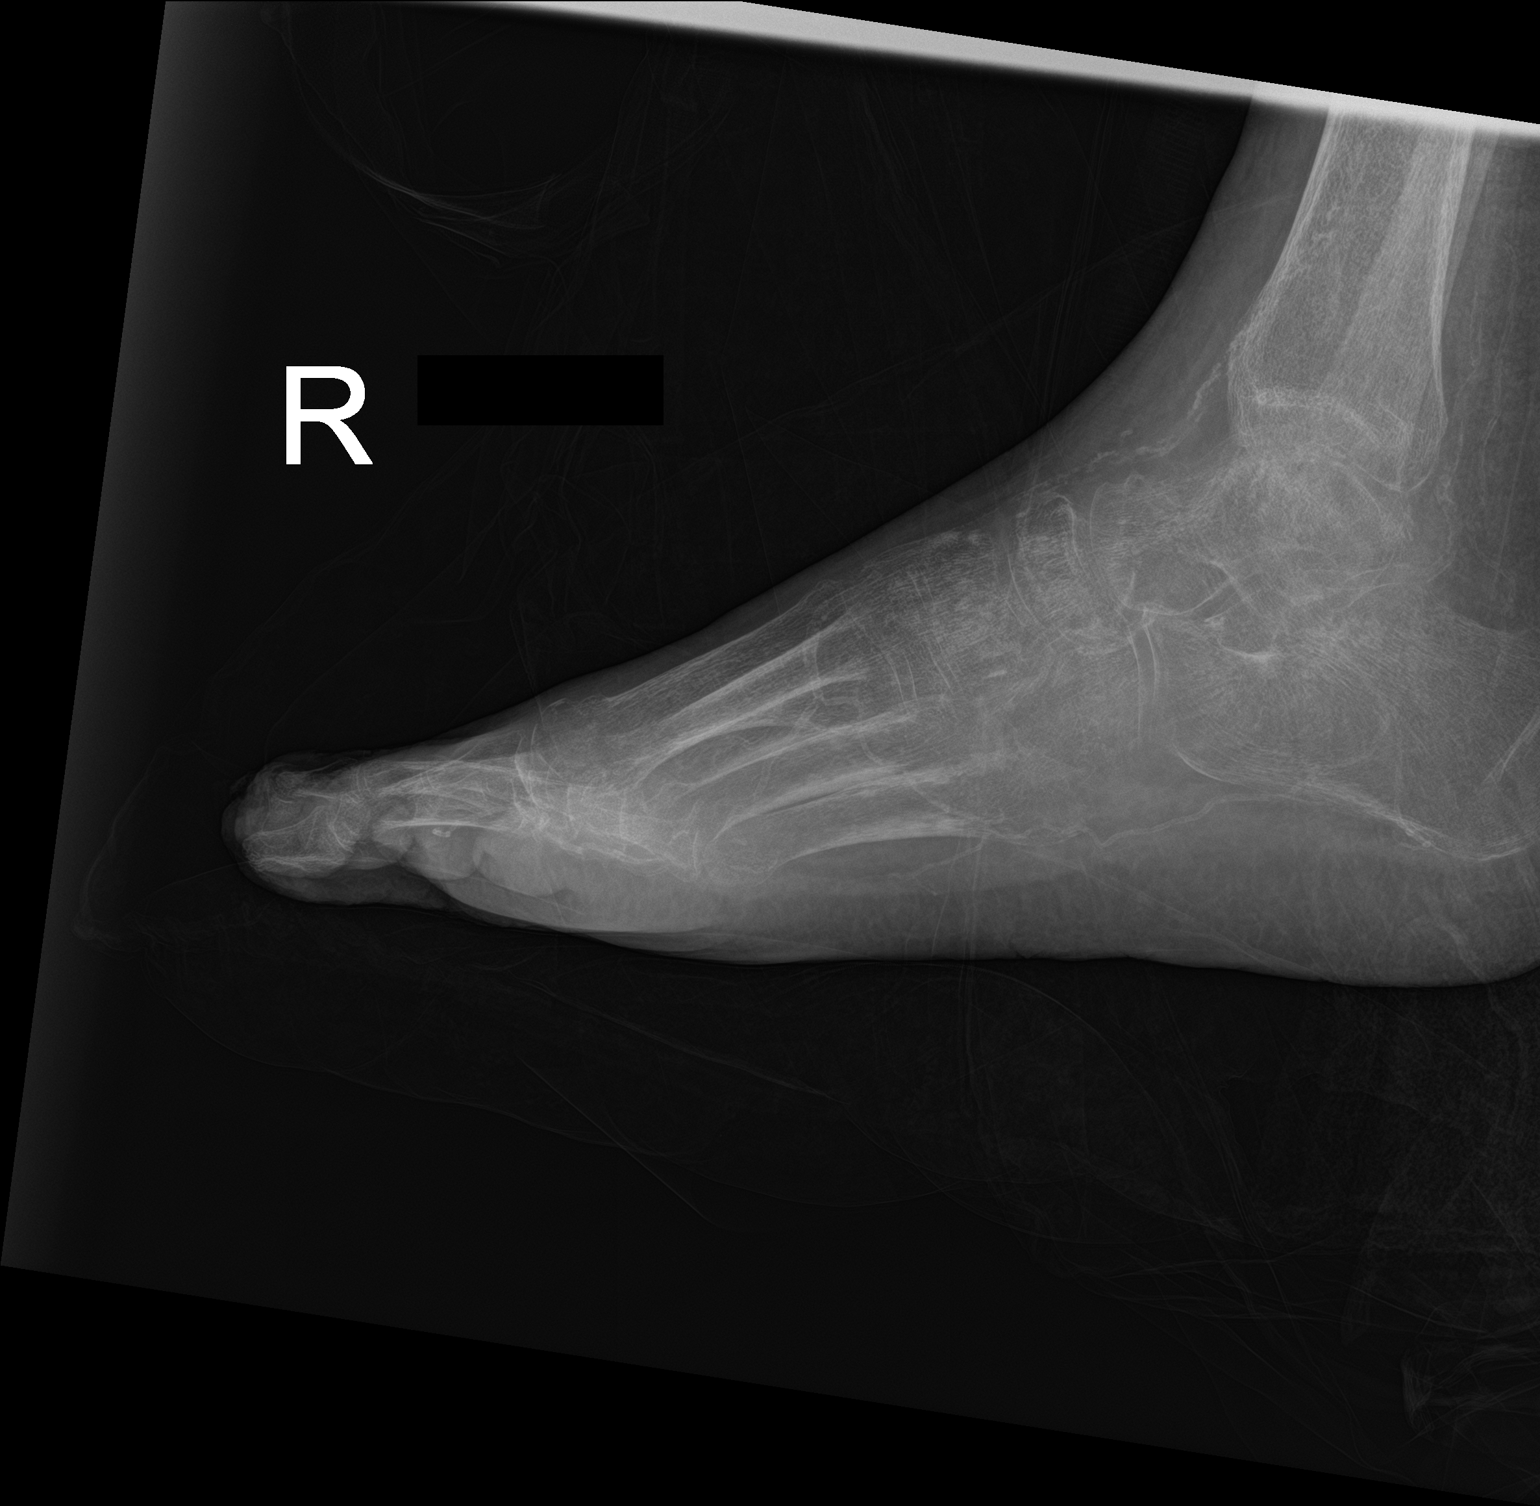

[3 of 3 positions shown; findings below may reference images not displayed]

FINDINGS: Frontal and lateral views of the right foot are obtained. The bones
are diffusely osteopenic. Likely necrotic soft tissues of the distal
margin right first digit. I do not see any acute or destructive bony
lesions. There is diffuse subcutaneous edema, with extensive
vascular calcifications.
IMPRESSION: 1. Likely necrotic soft tissues of the first digit. No radiographic
evidence of osteomyelitis.
2. Diffuse osteopenia.
3. Diffuse subcutaneous edema.

## 2020-01-11 MED ORDER — VANCOMYCIN HCL 1750 MG/350ML IV SOLN
1750.0000 mg | Freq: Once | INTRAVENOUS | Status: AC
  Administered 2020-01-11: 19:00:00 1750 mg via INTRAVENOUS
  Filled 2020-01-11: qty 350

## 2020-01-11 MED ORDER — SODIUM CHLORIDE 0.9 % IV SOLN
2.0000 g | Freq: Once | INTRAVENOUS | Status: AC
  Administered 2020-01-11: 18:00:00 2 g via INTRAVENOUS
  Filled 2020-01-11: qty 20

## 2020-01-11 MED ORDER — LACTATED RINGERS IV BOLUS (SEPSIS)
500.0000 mL | Freq: Once | INTRAVENOUS | Status: AC
  Administered 2020-01-11: 19:00:00 500 mL via INTRAVENOUS

## 2020-01-11 MED ORDER — VANCOMYCIN HCL IN DEXTROSE 1-5 GM/200ML-% IV SOLN: 1000.0000 mg | Freq: Once | INTRAVENOUS | Status: DC

## 2020-01-11 NOTE — Sepsis Progress Note (Signed)
Sepsis Protocol being followed by [facility]

## 2020-01-11 NOTE — ED Notes (Signed)
RN notified MD of blood pressure.

## 2020-01-11 NOTE — Sepsis Progress Note (Addendum)
Pt having numerous imaging studies. Notified bedside nurse of need to administer antibiotics and fluid bolus as soon as she is able.
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
## 2020-01-11 NOTE — ED Notes (Signed)
Pt is at xray.

## 2020-01-11 NOTE — ED Triage Notes (Signed)
Pt came from home. Pts family called out due to pus coming out of wound on pts right hand. Pt is swollen all over. Pt is stable axox4.   EMS VS BP- 152/80 HR-110 BS-240 T- 101.4

## 2020-01-12 ENCOUNTER — Inpatient Hospital Stay (HOSPITAL_COMMUNITY): Payer: Medicare Other

## 2020-01-12 ENCOUNTER — Encounter (HOSPITAL_COMMUNITY): Payer: Self-pay | Admitting: Internal Medicine

## 2020-01-12 DIAGNOSIS — E876 Hypokalemia: Secondary | ICD-10-CM | POA: Diagnosis present

## 2020-01-12 DIAGNOSIS — Z79899 Other long term (current) drug therapy: Secondary | ICD-10-CM | POA: Diagnosis not present

## 2020-01-12 DIAGNOSIS — L0390 Cellulitis, unspecified: Secondary | ICD-10-CM | POA: Diagnosis not present

## 2020-01-12 DIAGNOSIS — Z7982 Long term (current) use of aspirin: Secondary | ICD-10-CM | POA: Diagnosis not present

## 2020-01-12 DIAGNOSIS — L03116 Cellulitis of left lower limb: Secondary | ICD-10-CM | POA: Diagnosis present

## 2020-01-12 DIAGNOSIS — Z833 Family history of diabetes mellitus: Secondary | ICD-10-CM | POA: Diagnosis not present

## 2020-01-12 DIAGNOSIS — Z23 Encounter for immunization: Secondary | ICD-10-CM | POA: Diagnosis present

## 2020-01-12 DIAGNOSIS — E119 Type 2 diabetes mellitus without complications: Secondary | ICD-10-CM

## 2020-01-12 DIAGNOSIS — A419 Sepsis, unspecified organism: Secondary | ICD-10-CM | POA: Diagnosis present

## 2020-01-12 DIAGNOSIS — I10 Essential (primary) hypertension: Secondary | ICD-10-CM | POA: Diagnosis present

## 2020-01-12 DIAGNOSIS — N390 Urinary tract infection, site not specified: Secondary | ICD-10-CM | POA: Diagnosis present

## 2020-01-12 DIAGNOSIS — L03113 Cellulitis of right upper limb: Secondary | ICD-10-CM | POA: Diagnosis present

## 2020-01-12 DIAGNOSIS — D509 Iron deficiency anemia, unspecified: Secondary | ICD-10-CM | POA: Diagnosis present

## 2020-01-12 DIAGNOSIS — U071 COVID-19: Secondary | ICD-10-CM

## 2020-01-12 DIAGNOSIS — Z7401 Bed confinement status: Secondary | ICD-10-CM | POA: Diagnosis not present

## 2020-01-12 DIAGNOSIS — Z7984 Long term (current) use of oral hypoglycemic drugs: Secondary | ICD-10-CM | POA: Diagnosis not present

## 2020-01-12 DIAGNOSIS — E860 Dehydration: Secondary | ICD-10-CM | POA: Diagnosis present

## 2020-01-12 DIAGNOSIS — Z888 Allergy status to other drugs, medicaments and biological substances status: Secondary | ICD-10-CM | POA: Diagnosis not present

## 2020-01-12 DIAGNOSIS — Z87891 Personal history of nicotine dependence: Secondary | ICD-10-CM | POA: Diagnosis not present

## 2020-01-12 DIAGNOSIS — I160 Hypertensive urgency: Secondary | ICD-10-CM | POA: Diagnosis present

## 2020-01-12 DIAGNOSIS — T148XXA Other injury of unspecified body region, initial encounter: Secondary | ICD-10-CM | POA: Diagnosis present

## 2020-01-12 DIAGNOSIS — E872 Acidosis: Secondary | ICD-10-CM | POA: Diagnosis present

## 2020-01-12 DIAGNOSIS — L03115 Cellulitis of right lower limb: Secondary | ICD-10-CM | POA: Diagnosis present

## 2020-01-12 DIAGNOSIS — R532 Functional quadriplegia: Secondary | ICD-10-CM | POA: Diagnosis present

## 2020-01-12 DIAGNOSIS — E785 Hyperlipidemia, unspecified: Secondary | ICD-10-CM | POA: Diagnosis present

## 2020-01-12 LAB — COMPREHENSIVE METABOLIC PANEL
ALT: 7 U/L (ref 0–44)
AST: 20 U/L (ref 15–41)
Albumin: 2.4 g/dL — ABNORMAL LOW (ref 3.5–5.0)
Alkaline Phosphatase: 80 U/L (ref 38–126)
Anion gap: 14 (ref 5–15)
BUN: 18 mg/dL (ref 8–23)
CO2: 20 mmol/L — ABNORMAL LOW (ref 22–32)
Calcium: 8.1 mg/dL — ABNORMAL LOW (ref 8.9–10.3)
Chloride: 102 mmol/L (ref 98–111)
Creatinine, Ser: 0.89 mg/dL (ref 0.61–1.24)
GFR, Estimated: >60 mL/min (ref >60)
Glucose, Bld: 123 mg/dL — ABNORMAL HIGH (ref 70–99)
Potassium: 3.2 mmol/L — ABNORMAL LOW (ref 3.5–5.1)
Sodium: 136 mmol/L (ref 135–145)
Total Bilirubin: 0.8 mg/dL (ref 0.3–1.2)
Total Protein: 7 g/dL (ref 6.5–8.1)

## 2020-01-12 LAB — CBG MONITORING, ED
Glucose-Capillary: 118 mg/dL — ABNORMAL HIGH (ref 70–99)
Glucose-Capillary: 157 mg/dL — ABNORMAL HIGH (ref 70–99)
Glucose-Capillary: 130 mg/dL — ABNORMAL HIGH (ref 70–99)
Glucose-Capillary: 135 mg/dL — ABNORMAL HIGH (ref 70–99)
Glucose-Capillary: 152 mg/dL — ABNORMAL HIGH (ref 70–99)

## 2020-01-12 LAB — LACTIC ACID, PLASMA: Lactic Acid, Venous: 2.2 mmol/L (ref 0.5–1.9)

## 2020-01-12 LAB — APTT: aPTT: 38 seconds — ABNORMAL HIGH (ref 24–36)

## 2020-01-12 LAB — CBC
HCT: 30.9 % — ABNORMAL LOW (ref 39.0–52.0)
Hemoglobin: 9.7 g/dL — ABNORMAL LOW (ref 13.0–17.0)
MCH: 30.5 pg (ref 26.0–34.0)
MCHC: 31.4 g/dL (ref 30.0–36.0)
MCV: 97.2 fL (ref 80.0–100.0)
Platelets: 249 10*3/uL (ref 150–400)
RBC: 3.18 MIL/uL — ABNORMAL LOW (ref 4.22–5.81)
RDW: 14 % (ref 11.5–15.5)
WBC: 8.8 10*3/uL (ref 4.0–10.5)
nRBC: 0 % (ref 0.0–0.2)

## 2020-01-12 LAB — BRAIN NATRIURETIC PEPTIDE
B Natriuretic Peptide: 219.9 pg/mL — ABNORMAL HIGH (ref 0.0–100.0)
B Natriuretic Peptide: 296.2 pg/mL — ABNORMAL HIGH (ref 0.0–100.0)

## 2020-01-12 LAB — MAGNESIUM: Magnesium: 1 mg/dL — ABNORMAL LOW (ref 1.7–2.4)

## 2020-01-12 LAB — C DIFFICILE QUICK SCREEN W PCR REFLEX
C Diff antigen: NEGATIVE
C Diff interpretation: NOT DETECTED
C Diff toxin: NEGATIVE

## 2020-01-12 LAB — PROCALCITONIN: Procalcitonin: <0.1 ng/mL

## 2020-01-12 IMAGING — CT CT TIBIA FIBULA *L* W/O CM
2 of 3 series · 13 of 35 positions shown, 16 images · non-contrast
Comparison: Plain films of the lower legs [DATE].

CLINICAL DATA: Multiple wounds on both legs. Bilateral lower
extremity swelling for several weeks.

EXAM:
CT OF THE LOWER BILATERAL EXTREMITY WITHOUT CONTRAST
TECHNIQUE: Multidetector CT imaging of the lower bilateral extremity was
performed according to the standard protocol.

[Series 5: lfov ext 3.0 b40s · axial · 0.94mm/px · z∈[+119,+953]mm · 10 of 330 slices shown, 13 images]
[im 26/330  soft-tissue]
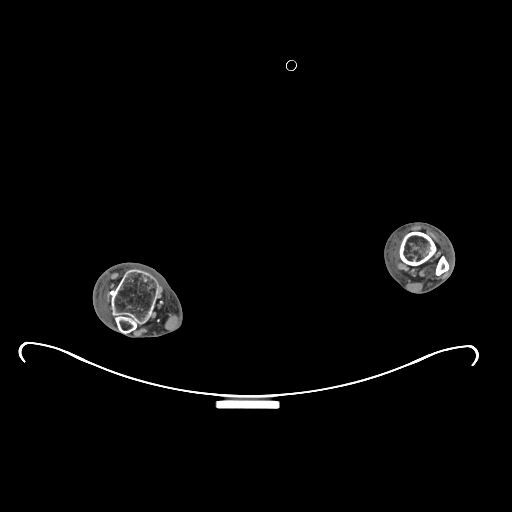
[im 26/330  bone]
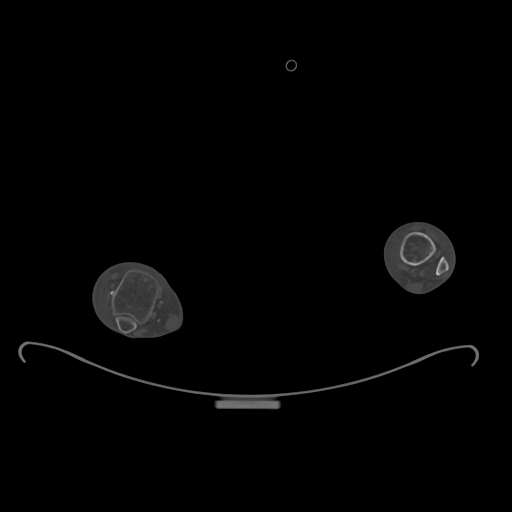
[im 51/330  bone]
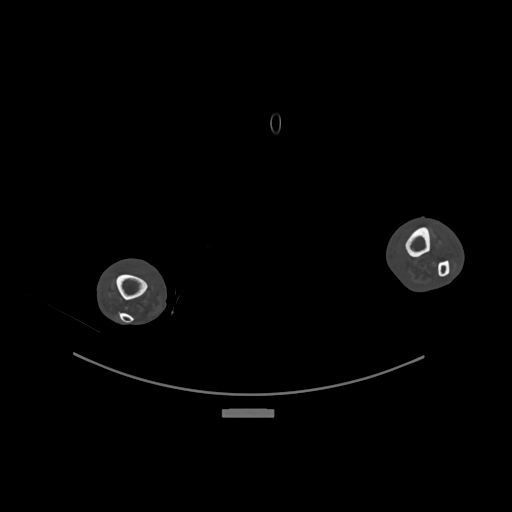
[im 102/330  bone]
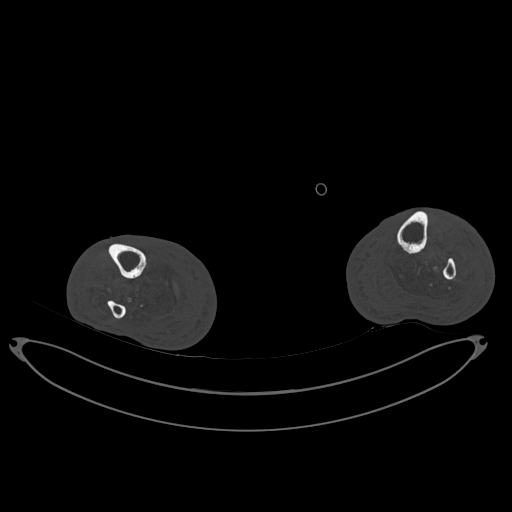
[im 127/330  bone]
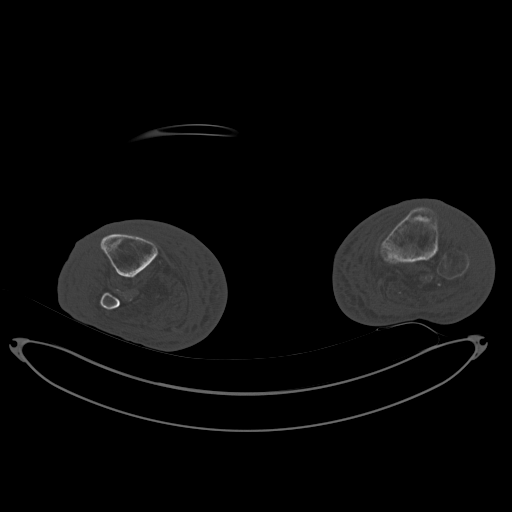
[im 152/330  soft-tissue]
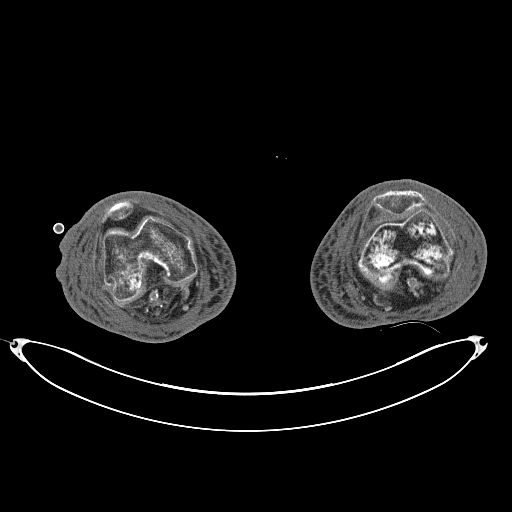
[im 152/330  bone]
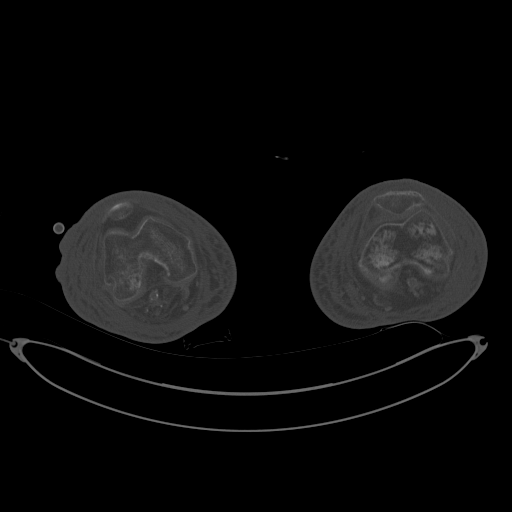
[im 178/330  bone]
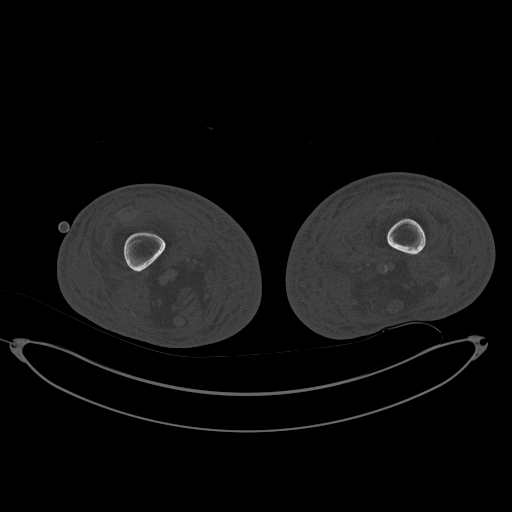
[im 203/330  bone]
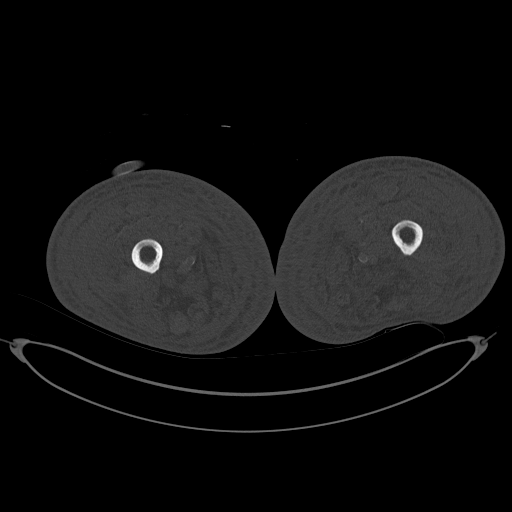
[im 254/330  bone]
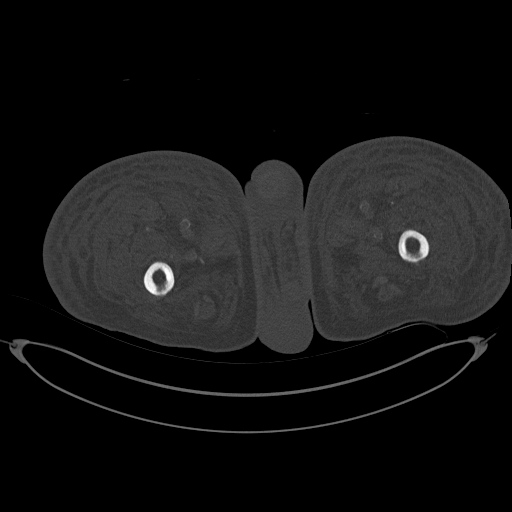
[im 279/330  soft-tissue]
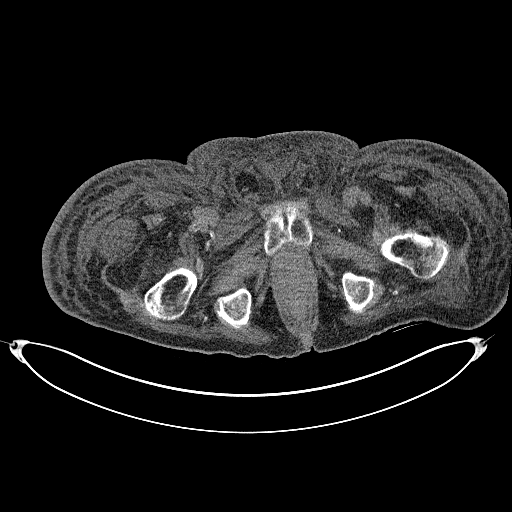
[im 279/330  bone]
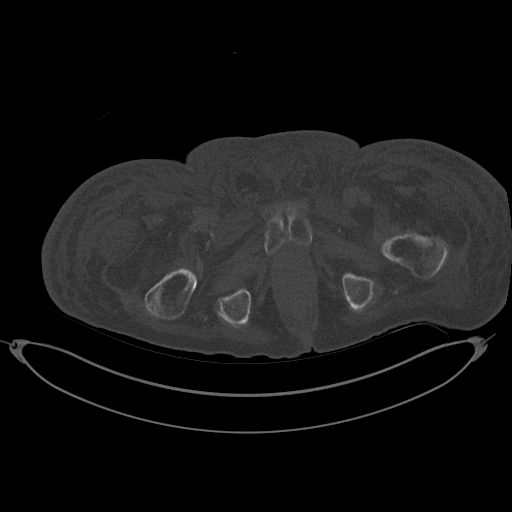
[im 304/330  bone]
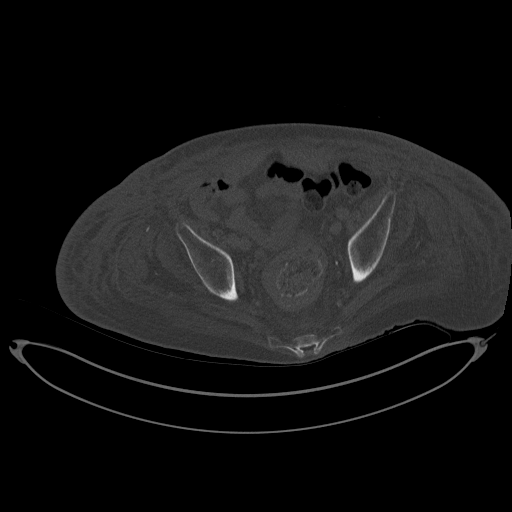

[Series 8: coronalsoft tissue · coronal · 1.01mm/px · 3 of 95 slices shown]
[im 19/95  bone]
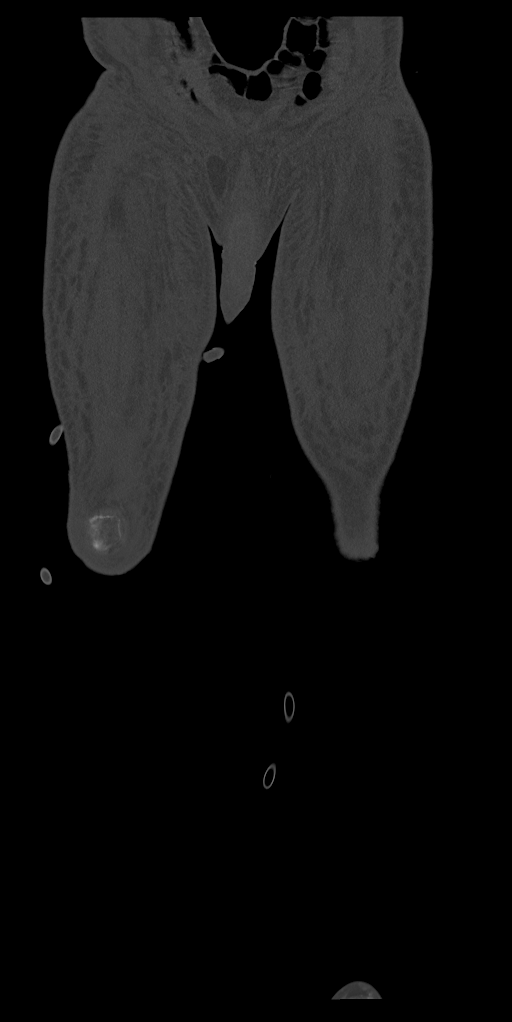
[im 38/95  bone]
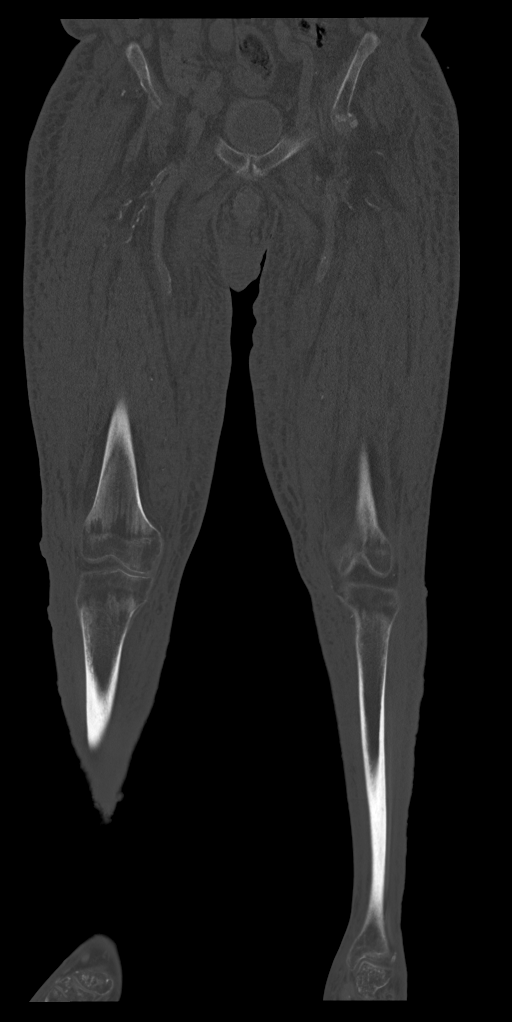
[im 57/95  bone]
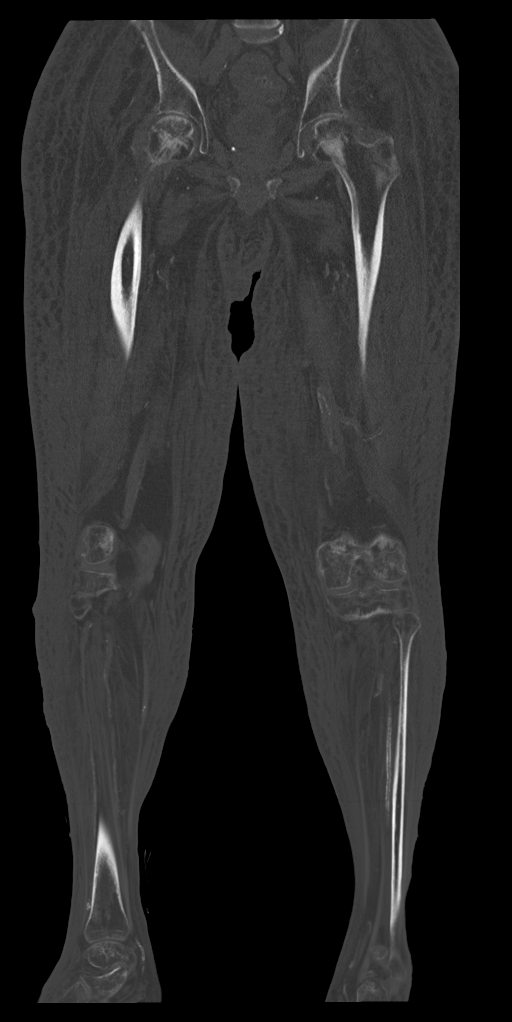

[13 of 35 positions shown; findings below may reference images not displayed]

FINDINGS: Bones/Joint/Cartilage

There is no CT evidence of osteomyelitis. No acute bony or joint
abnormality is identified. Bones appear somewhat osteopenic. No
focal lesion.

Ligaments

Suboptimally assessed by CT.

Muscles and Tendons

No intramuscular fluid collection is seen. Fat planes within muscle
are preserved. No evidence of tear or strain. No mass. There is some
fatty atrophy of musculature bilaterally which appears symmetric. No
gas within muscle or tracking along fascial planes.

Soft tissues

There is extensive stranding in subcutaneous fatty tissues
bilaterally. Scattered skin wounds are noted. No focal fluid
collection is identified.
IMPRESSION: Marked stranding in subcutaneous fatty tissues bilaterally
consistent with cellulitis and/or edema. Scattered skin wounds are
seen.

Negative for CT evidence of osteomyelitis, septic joint or myositis.

## 2020-01-12 IMAGING — CT CT FEMUR *L* W/O CM
2 of 3 series · 13 of 35 positions shown, 16 images · non-contrast
Comparison: Plain films of the lower legs [DATE].

CLINICAL DATA: Multiple wounds on both legs. Bilateral lower
extremity swelling for several weeks.

EXAM:
CT OF THE LOWER BILATERAL EXTREMITY WITHOUT CONTRAST
TECHNIQUE: Multidetector CT imaging of the lower bilateral extremity was
performed according to the standard protocol.

[Series 5: lfov ext 3.0 b40s · axial · 0.94mm/px · z∈[+119,+953]mm · 10 of 330 slices shown, 13 images]
[im 26/330  soft-tissue]
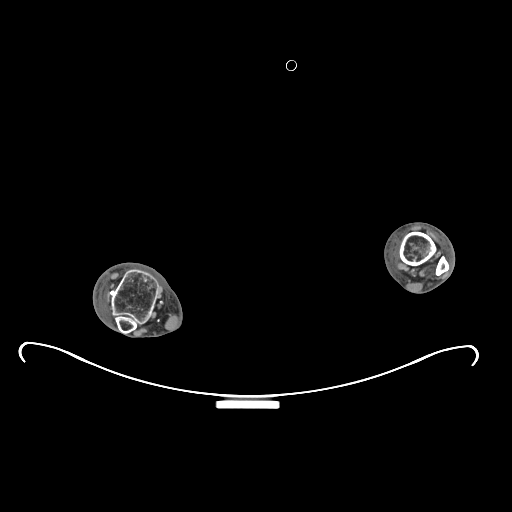
[im 26/330  bone]
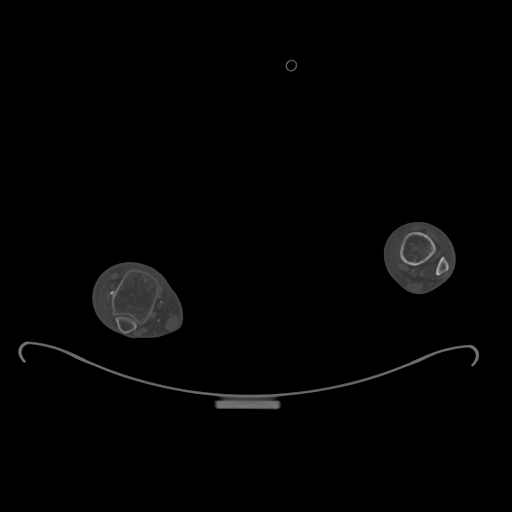
[im 51/330  bone]
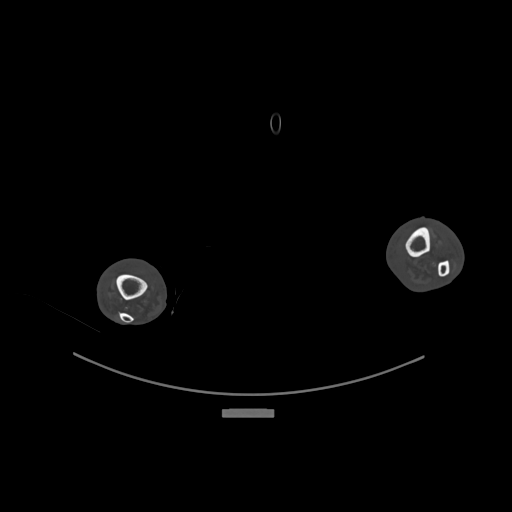
[im 102/330  bone]
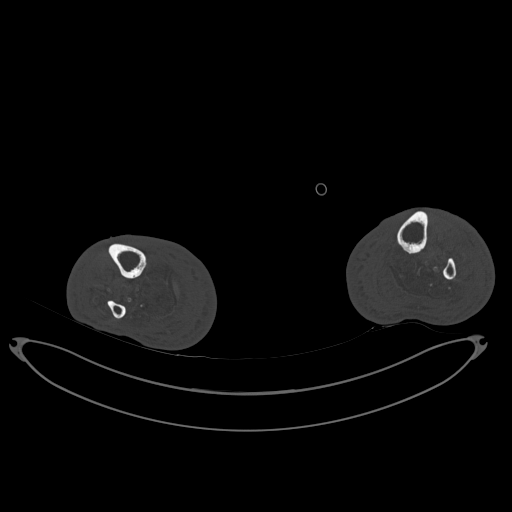
[im 127/330  bone]
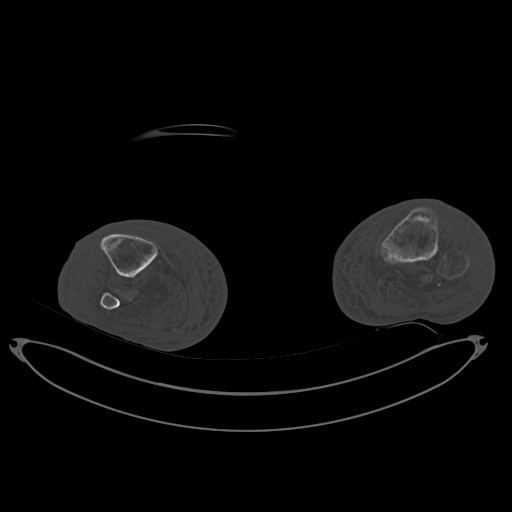
[im 152/330  soft-tissue]
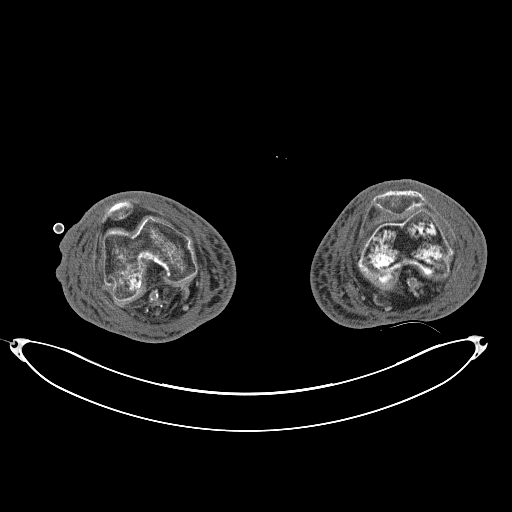
[im 152/330  bone]
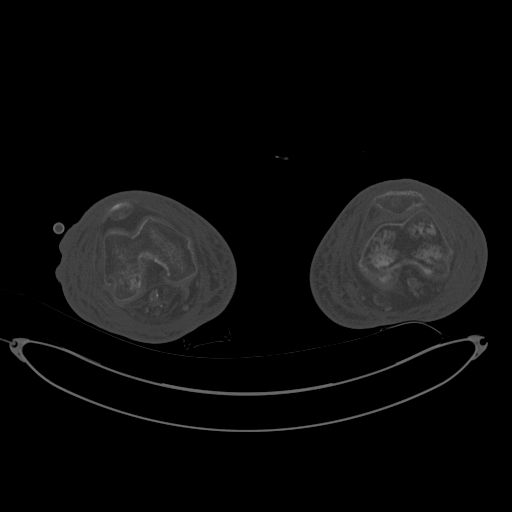
[im 178/330  bone]
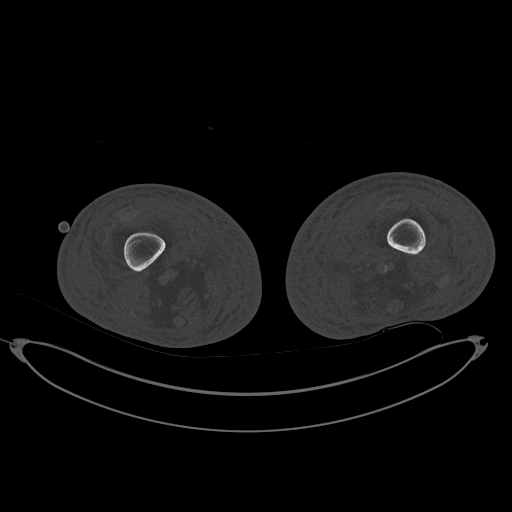
[im 203/330  bone]
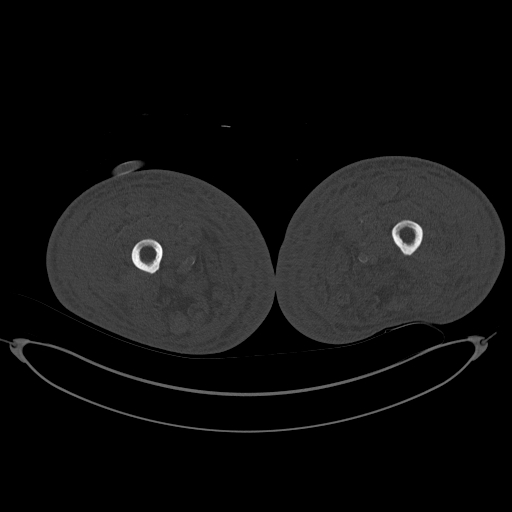
[im 254/330  bone]
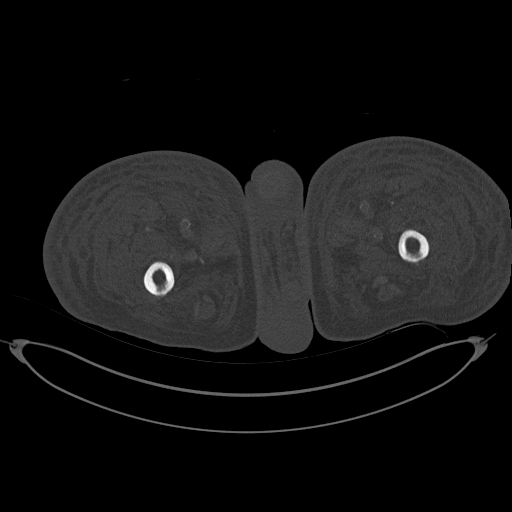
[im 279/330  soft-tissue]
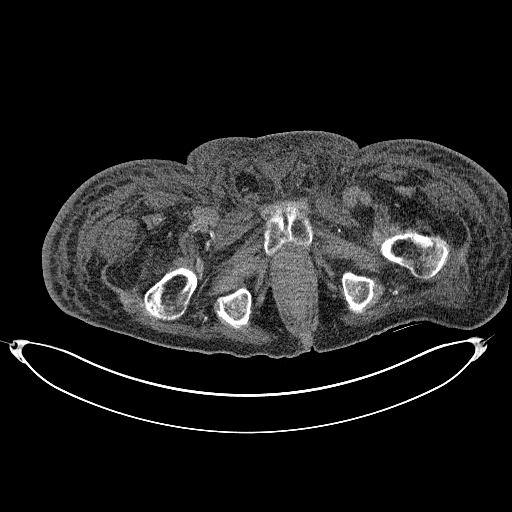
[im 279/330  bone]
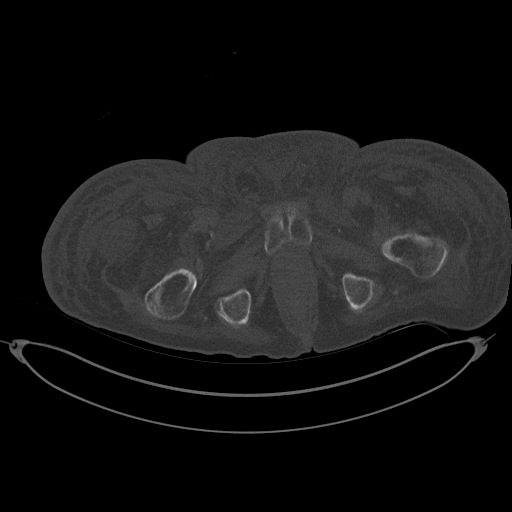
[im 304/330  bone]
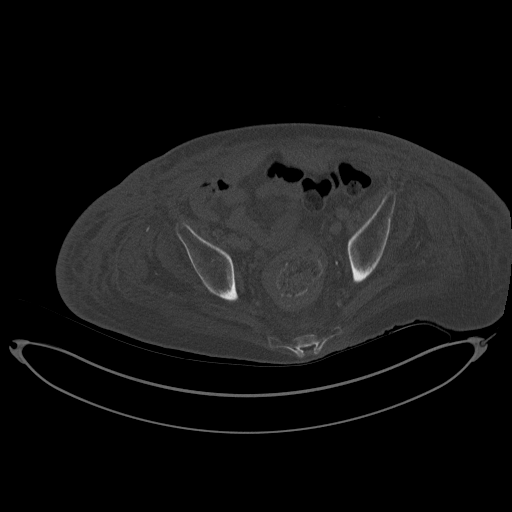

[Series 8: coronalsoft tissue · coronal · 1.01mm/px · 3 of 95 slices shown]
[im 19/95  bone]
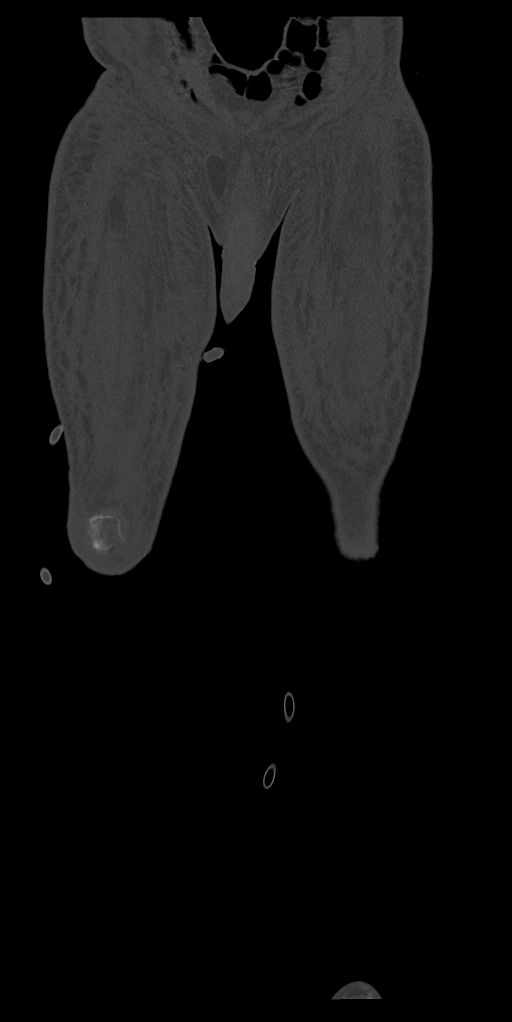
[im 38/95  bone]
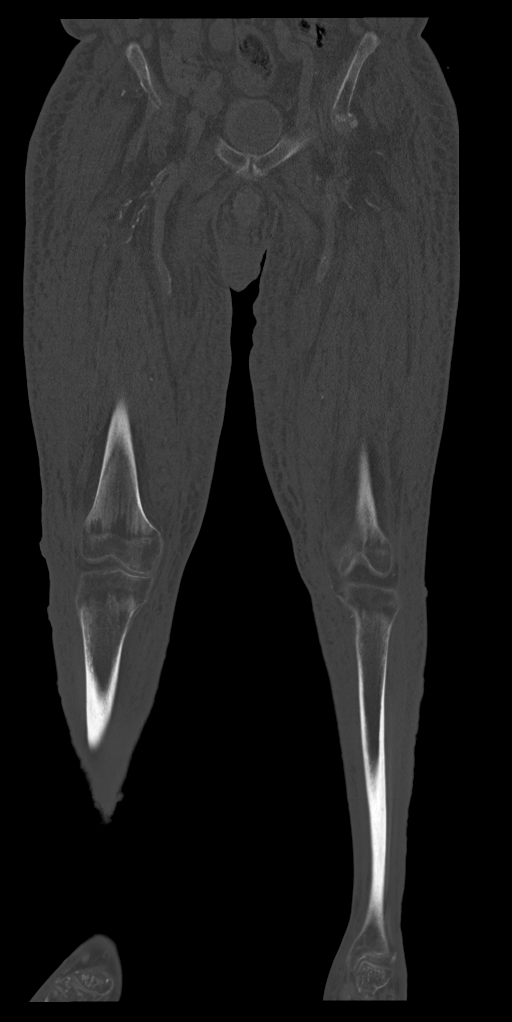
[im 57/95  bone]
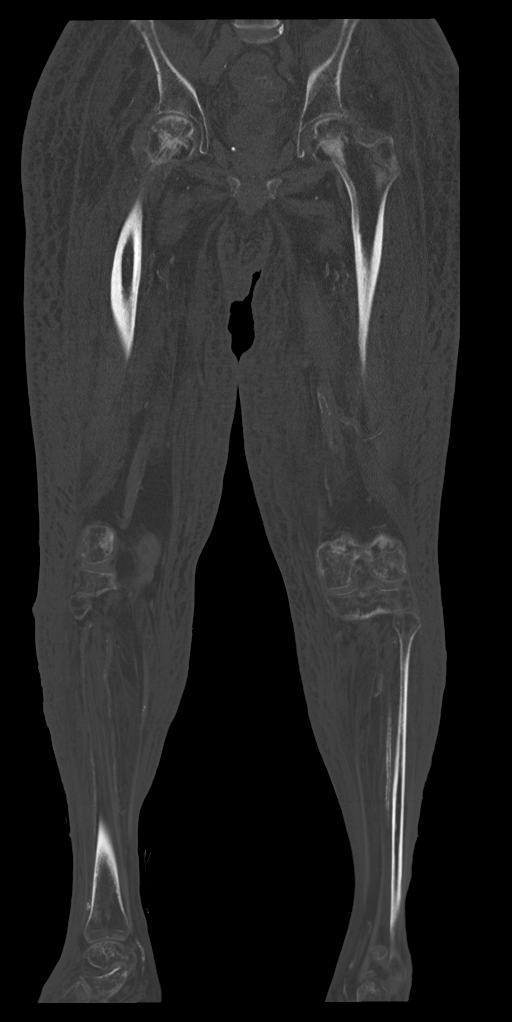

[13 of 35 positions shown; findings below may reference images not displayed]

FINDINGS: Bones/Joint/Cartilage

There is no CT evidence of osteomyelitis. No acute bony or joint
abnormality is identified. Bones appear somewhat osteopenic. No
focal lesion.

Ligaments

Suboptimally assessed by CT.

Muscles and Tendons

No intramuscular fluid collection is seen. Fat planes within muscle
are preserved. No evidence of tear or strain. No mass. There is some
fatty atrophy of musculature bilaterally which appears symmetric. No
gas within muscle or tracking along fascial planes.

Soft tissues

There is extensive stranding in subcutaneous fatty tissues
bilaterally. Scattered skin wounds are noted. No focal fluid
collection is identified.
IMPRESSION: Marked stranding in subcutaneous fatty tissues bilaterally
consistent with cellulitis and/or edema. Scattered skin wounds are
seen.

Negative for CT evidence of osteomyelitis, septic joint or myositis.

## 2020-01-12 IMAGING — CT CT TIBIA FIBULA *R* W/O CM
2 of 3 series · 13 of 35 positions shown, 16 images · non-contrast
Comparison: Plain films of the lower legs [DATE].

CLINICAL DATA: Multiple wounds on both legs. Bilateral lower
extremity swelling for several weeks.

EXAM:
CT OF THE LOWER BILATERAL EXTREMITY WITHOUT CONTRAST
TECHNIQUE: Multidetector CT imaging of the lower bilateral extremity was
performed according to the standard protocol.

[Series 5: lfov ext 3.0 b40s · axial · 0.94mm/px · z∈[+119,+953]mm · 10 of 330 slices shown, 13 images]
[im 26/330  soft-tissue]
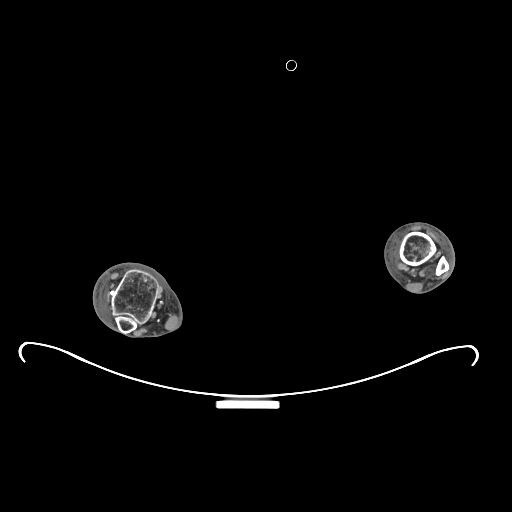
[im 26/330  bone]
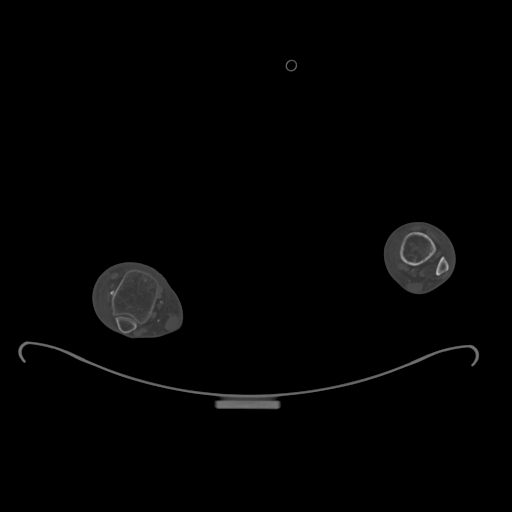
[im 51/330  bone]
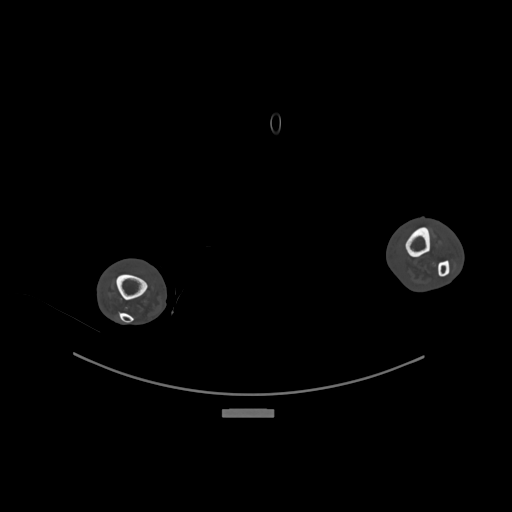
[im 102/330  bone]
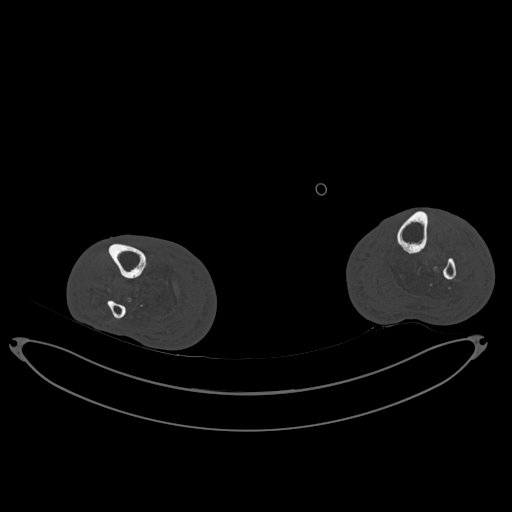
[im 127/330  bone]
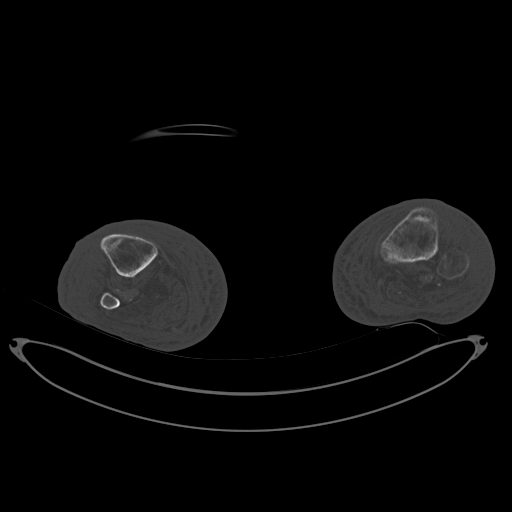
[im 152/330  soft-tissue]
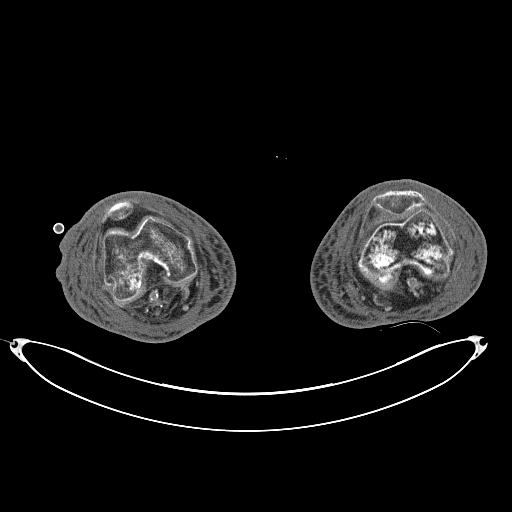
[im 152/330  bone]
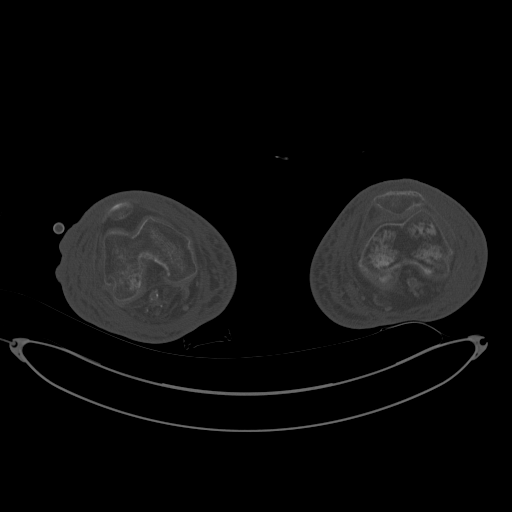
[im 178/330  bone]
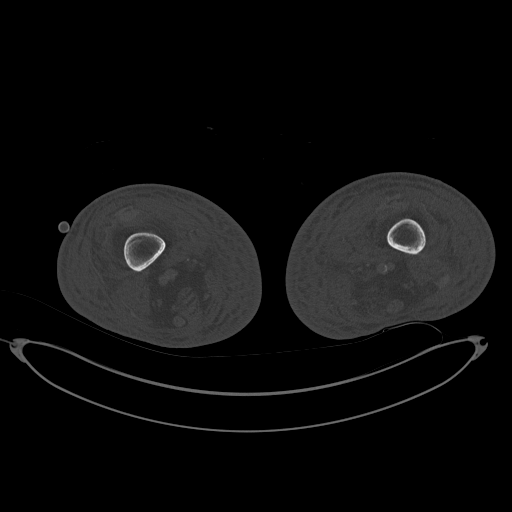
[im 203/330  bone]
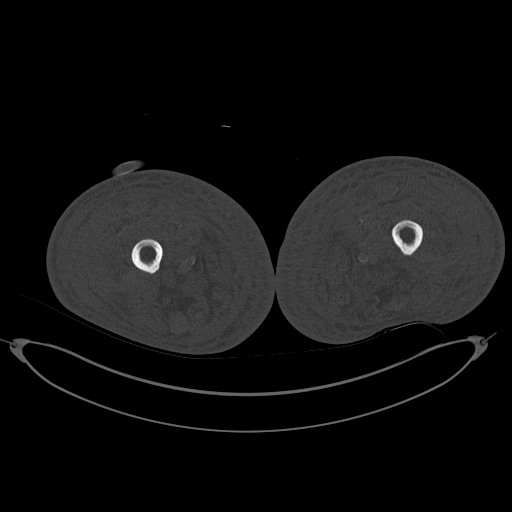
[im 254/330  bone]
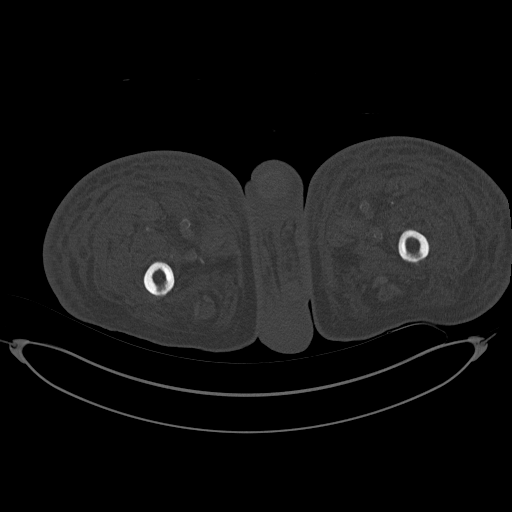
[im 279/330  soft-tissue]
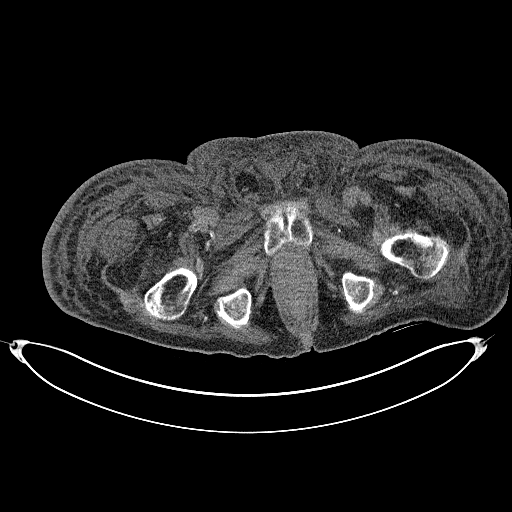
[im 279/330  bone]
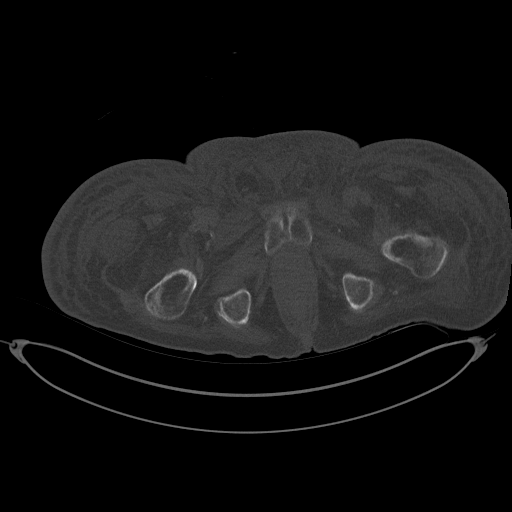
[im 304/330  bone]
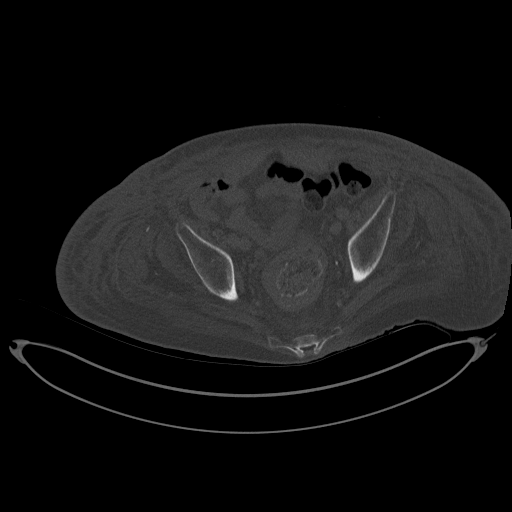

[Series 8: coronalsoft tissue · coronal · 1.01mm/px · 3 of 95 slices shown]
[im 19/95  bone]
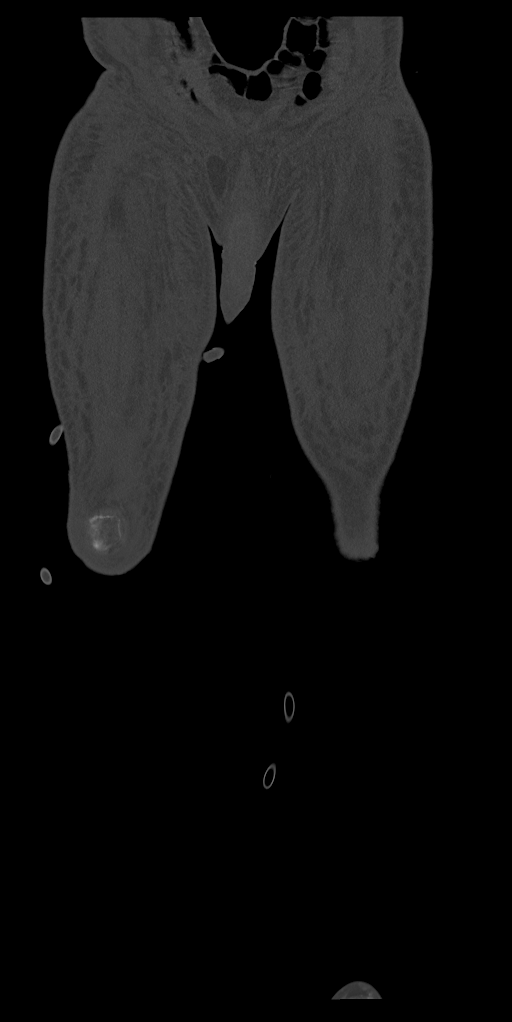
[im 38/95  bone]
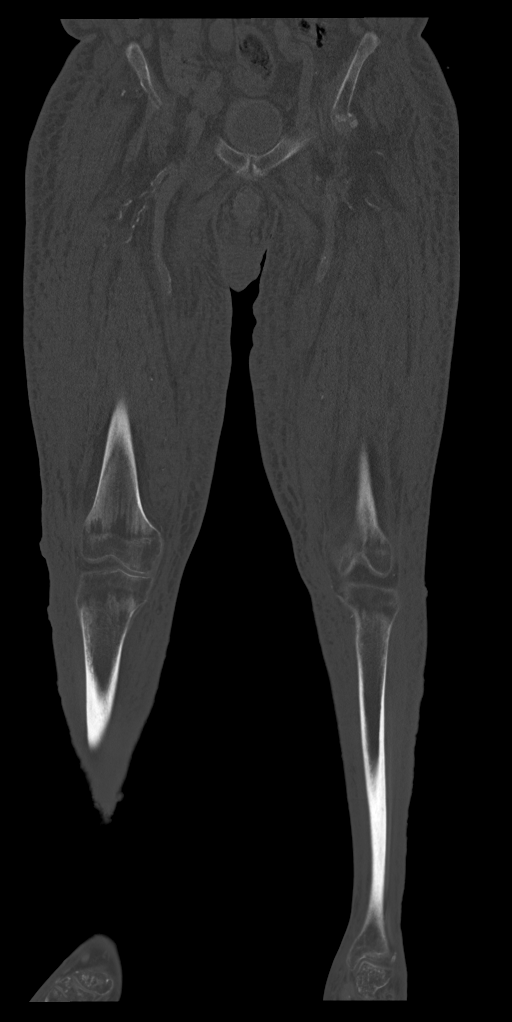
[im 57/95  bone]
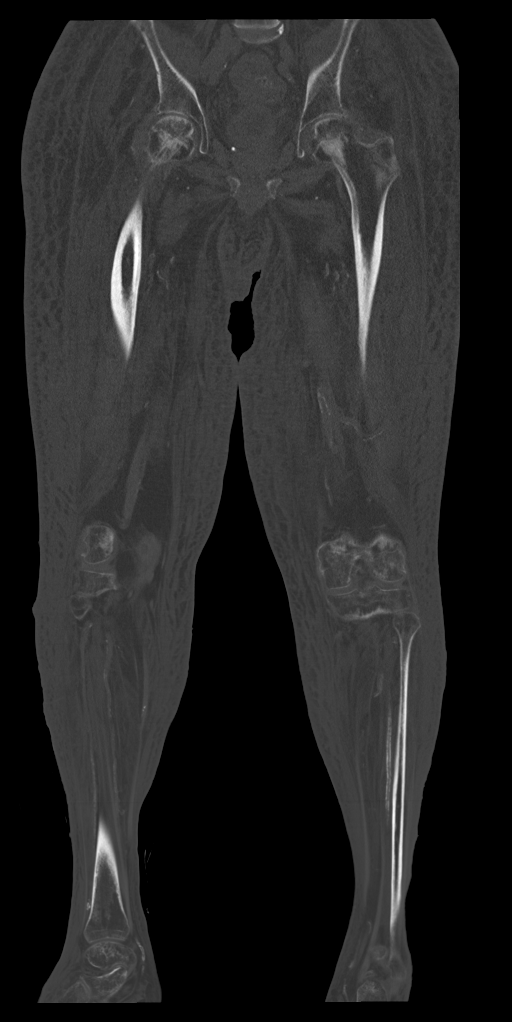

[13 of 35 positions shown; findings below may reference images not displayed]

FINDINGS: Bones/Joint/Cartilage

There is no CT evidence of osteomyelitis. No acute bony or joint
abnormality is identified. Bones appear somewhat osteopenic. No
focal lesion.

Ligaments

Suboptimally assessed by CT.

Muscles and Tendons

No intramuscular fluid collection is seen. Fat planes within muscle
are preserved. No evidence of tear or strain. No mass. There is some
fatty atrophy of musculature bilaterally which appears symmetric. No
gas within muscle or tracking along fascial planes.

Soft tissues

There is extensive stranding in subcutaneous fatty tissues
bilaterally. Scattered skin wounds are noted. No focal fluid
collection is identified.
IMPRESSION: Marked stranding in subcutaneous fatty tissues bilaterally
consistent with cellulitis and/or edema. Scattered skin wounds are
seen.

Negative for CT evidence of osteomyelitis, septic joint or myositis.

## 2020-01-12 IMAGING — CT CT FEMUR *R* W/O CM
2 of 3 series · 13 of 35 positions shown, 16 images · non-contrast
Comparison: Plain films of the lower legs [DATE].

CLINICAL DATA: Multiple wounds on both legs. Bilateral lower
extremity swelling for several weeks.

EXAM:
CT OF THE LOWER BILATERAL EXTREMITY WITHOUT CONTRAST
TECHNIQUE: Multidetector CT imaging of the lower bilateral extremity was
performed according to the standard protocol.

[Series 5: lfov ext 3.0 b40s · axial · 0.94mm/px · z∈[+119,+953]mm · 10 of 330 slices shown, 13 images]
[im 26/330  soft-tissue]
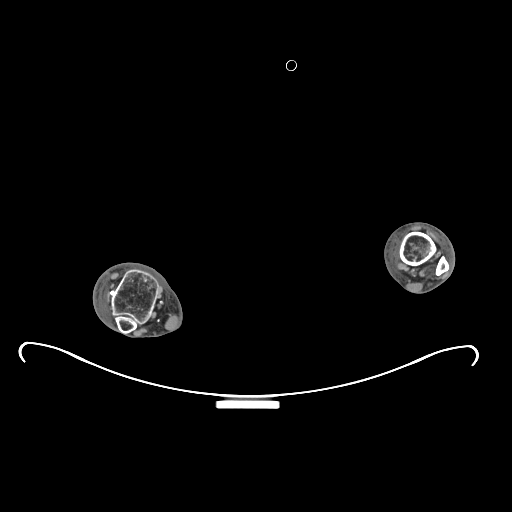
[im 26/330  bone]
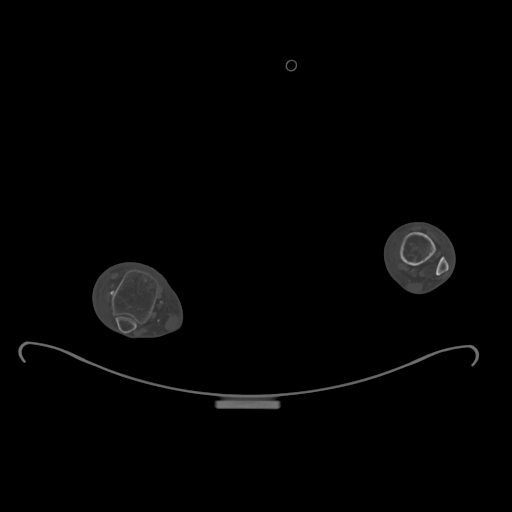
[im 51/330  bone]
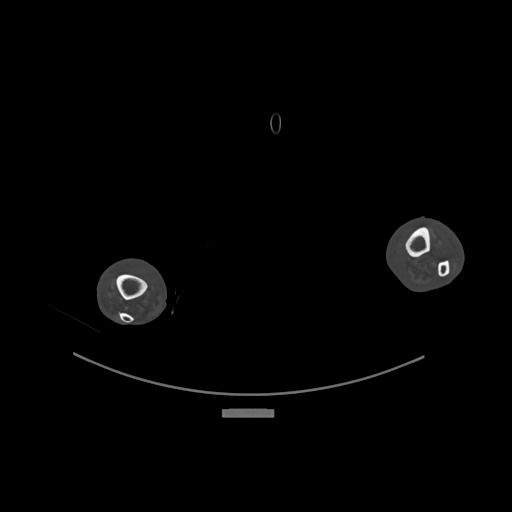
[im 102/330  bone]
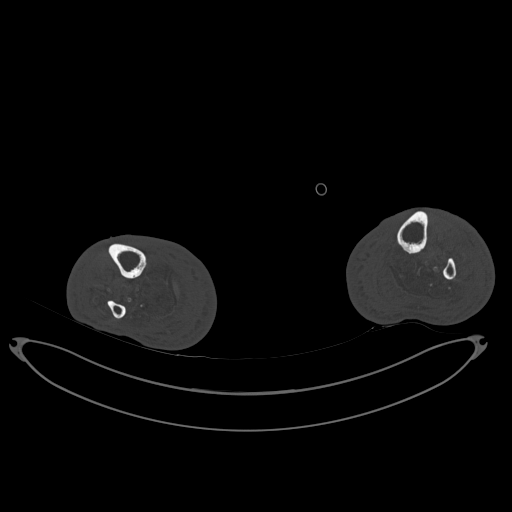
[im 127/330  bone]
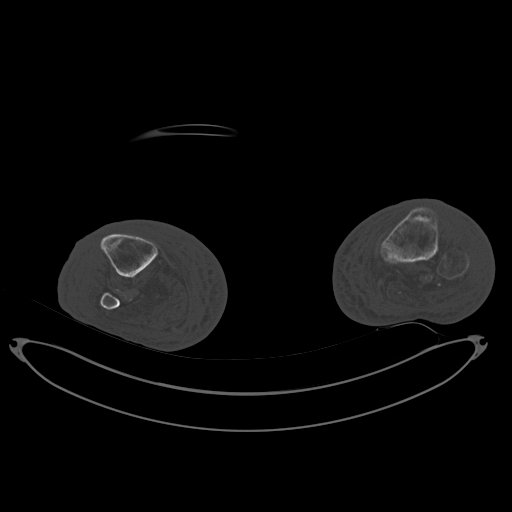
[im 152/330  soft-tissue]
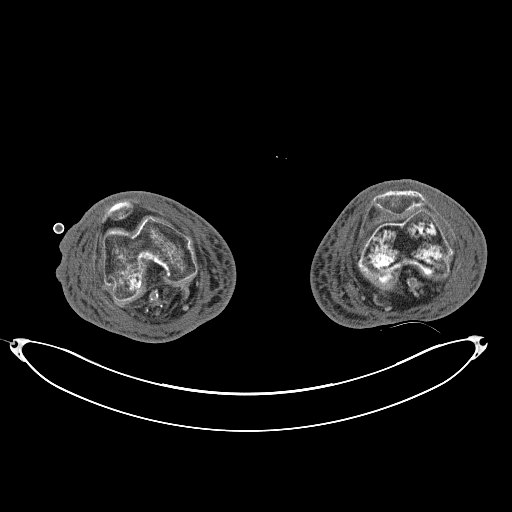
[im 152/330  bone]
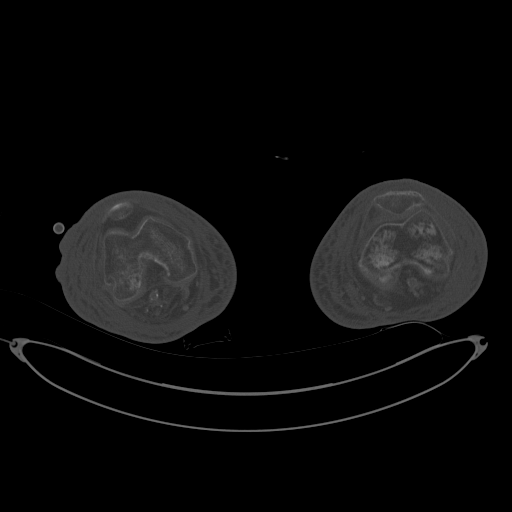
[im 178/330  bone]
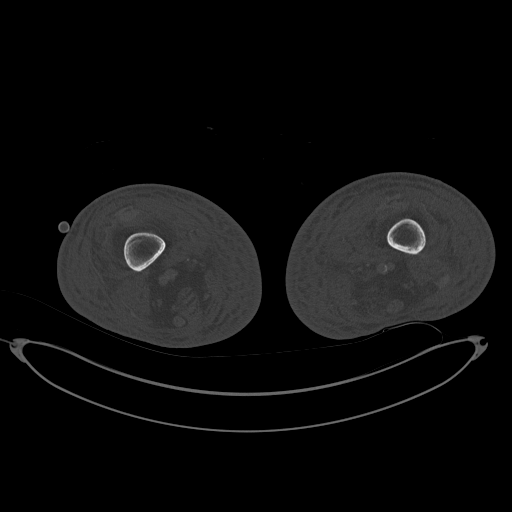
[im 203/330  bone]
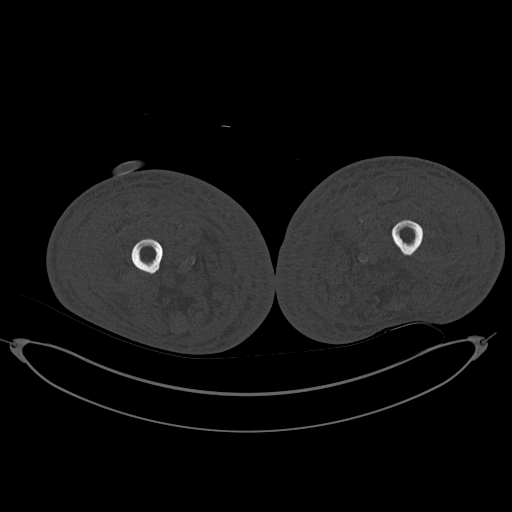
[im 254/330  bone]
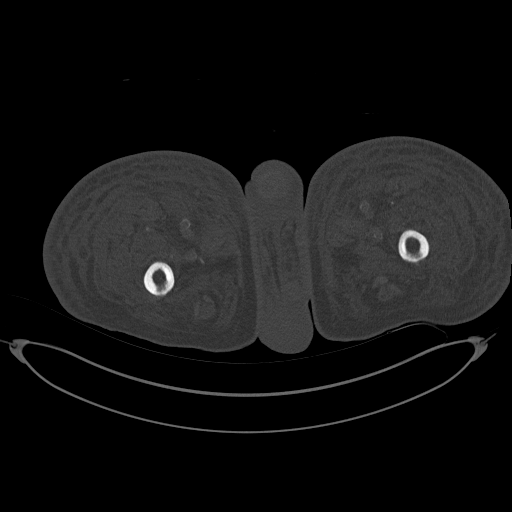
[im 279/330  soft-tissue]
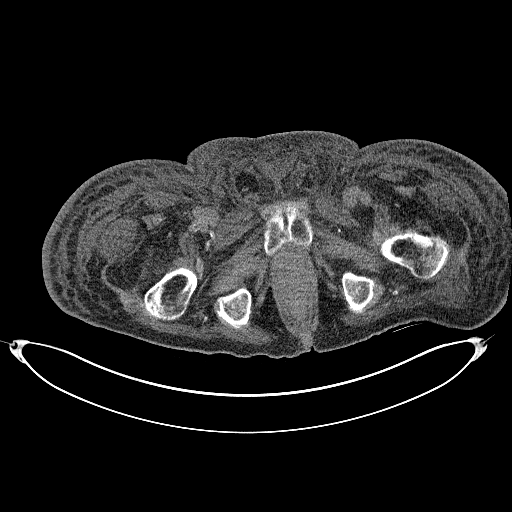
[im 279/330  bone]
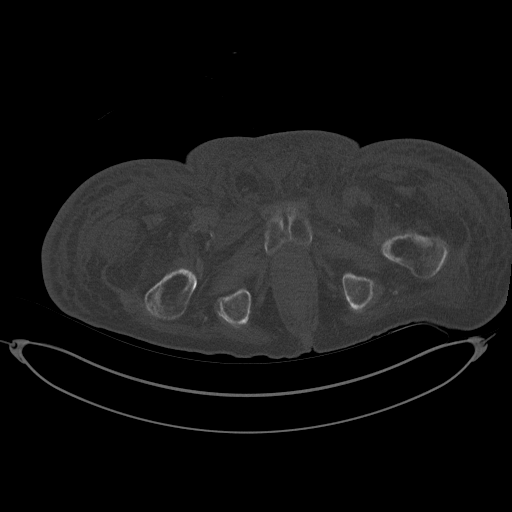
[im 304/330  bone]
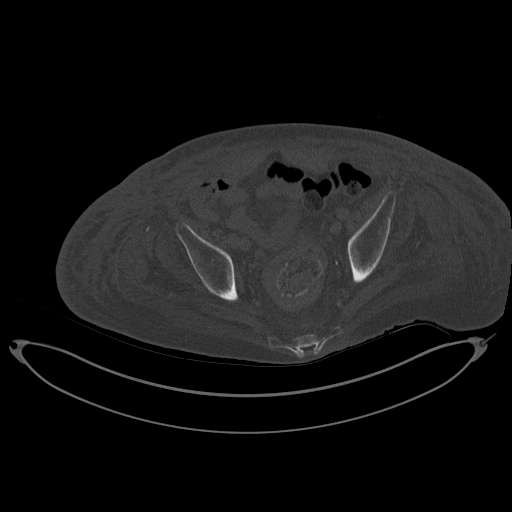

[Series 8: coronalsoft tissue · coronal · 1.01mm/px · 3 of 95 slices shown]
[im 19/95  bone]
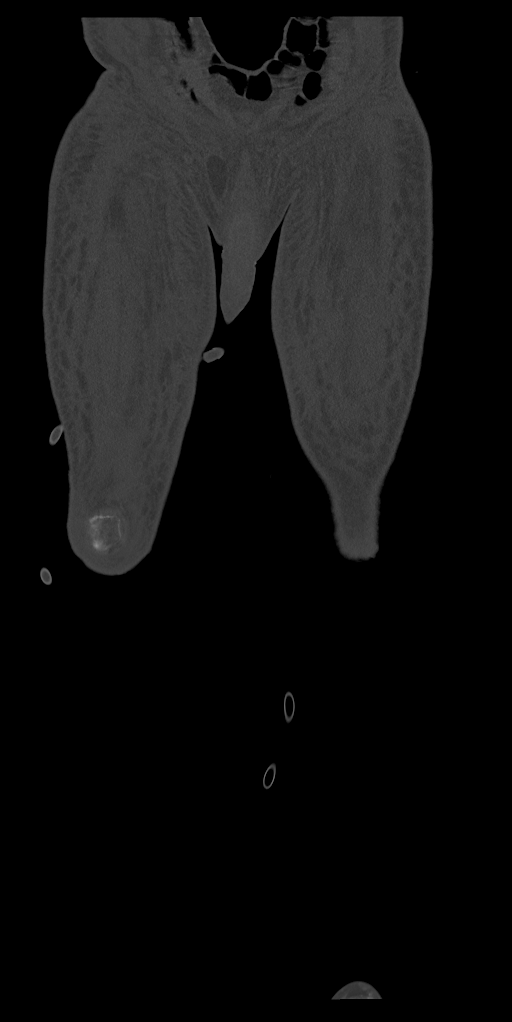
[im 38/95  bone]
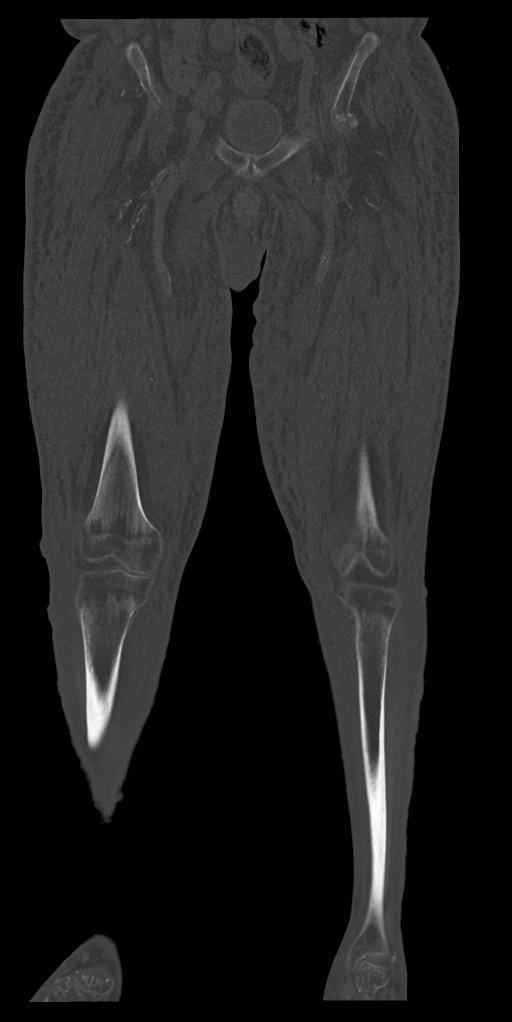
[im 57/95  bone]
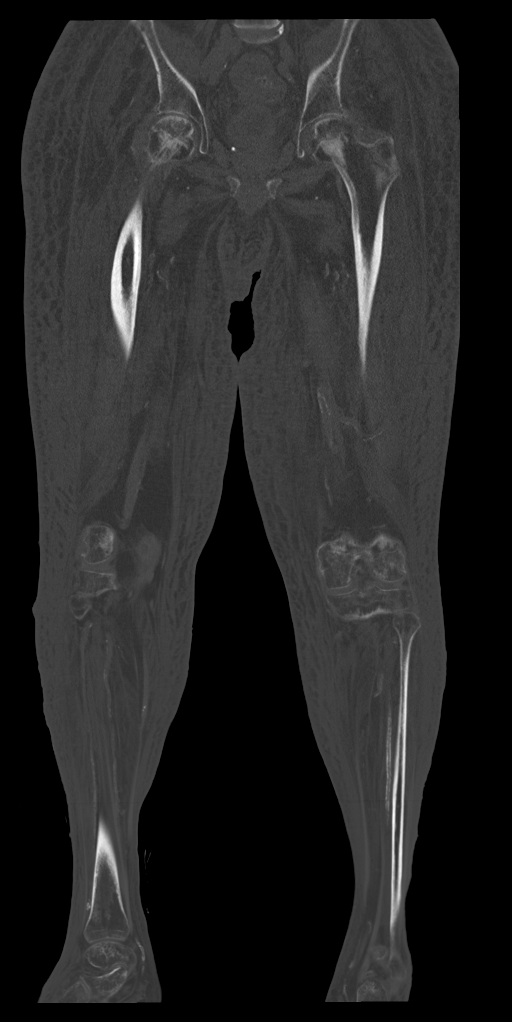

[13 of 35 positions shown; findings below may reference images not displayed]

FINDINGS: Bones/Joint/Cartilage

There is no CT evidence of osteomyelitis. No acute bony or joint
abnormality is identified. Bones appear somewhat osteopenic. No
focal lesion.

Ligaments

Suboptimally assessed by CT.

Muscles and Tendons

No intramuscular fluid collection is seen. Fat planes within muscle
are preserved. No evidence of tear or strain. No mass. There is some
fatty atrophy of musculature bilaterally which appears symmetric. No
gas within muscle or tracking along fascial planes.

Soft tissues

There is extensive stranding in subcutaneous fatty tissues
bilaterally. Scattered skin wounds are noted. No focal fluid
collection is identified.
IMPRESSION: Marked stranding in subcutaneous fatty tissues bilaterally
consistent with cellulitis and/or edema. Scattered skin wounds are
seen.

Negative for CT evidence of osteomyelitis, septic joint or myositis.

## 2020-01-12 MED ORDER — CARVEDILOL 6.25 MG PO TABS
6.2500 mg | ORAL_TABLET | Freq: Two times a day (BID) | ORAL | Status: DC
  Administered 2020-01-12 – 2020-01-13 (×3): 6.25 mg via ORAL
  Filled 2020-01-12: qty 1
  Filled 2020-01-12: qty 2
  Filled 2020-01-12: qty 1
  Filled 2020-01-12: qty 2

## 2020-01-12 MED ORDER — LACTATED RINGERS IV SOLN: INTRAVENOUS | Status: AC

## 2020-01-12 MED ORDER — POTASSIUM CHLORIDE 20 MEQ PO PACK
40.0000 meq | PACK | Freq: Once | ORAL | Status: AC
  Administered 2020-01-12: 16:00:00 40 meq via ORAL
  Filled 2020-01-12: qty 2

## 2020-01-12 MED ORDER — SODIUM CHLORIDE 0.9 % IV SOLN
2.0000 g | Freq: Three times a day (TID) | INTRAVENOUS | Status: DC
  Administered 2020-01-12 – 2020-01-13 (×5): 2 g via INTRAVENOUS
  Filled 2020-01-12 (×5): qty 2

## 2020-01-12 MED ORDER — EPINEPHRINE 0.3 MG/0.3ML IJ SOAJ
0.3000 mg | Freq: Once | INTRAMUSCULAR | Status: DC | PRN
  Filled 2020-01-12: qty 0.6

## 2020-01-12 MED ORDER — VANCOMYCIN HCL IN DEXTROSE 1-5 GM/200ML-% IV SOLN: 1000.0000 mg | Freq: Once | INTRAVENOUS | Status: DC

## 2020-01-12 MED ORDER — HEPARIN SODIUM (PORCINE) 5000 UNIT/ML IJ SOLN
5000.0000 [IU] | Freq: Three times a day (TID) | INTRAMUSCULAR | Status: DC
  Administered 2020-01-12 – 2020-01-13 (×5): 5000 [IU] via SUBCUTANEOUS
  Filled 2020-01-12 (×5): qty 1

## 2020-01-12 MED ORDER — LABETALOL HCL 5 MG/ML IV SOLN: 10.0000 mg | INTRAVENOUS | Status: DC | PRN

## 2020-01-12 MED ORDER — DIPHENHYDRAMINE HCL 50 MG/ML IJ SOLN: 50.0000 mg | Freq: Once | INTRAMUSCULAR | Status: DC | PRN

## 2020-01-12 MED ORDER — VANCOMYCIN HCL 750 MG/150ML IV SOLN
750.0000 mg | Freq: Two times a day (BID) | INTRAVENOUS | Status: DC
  Administered 2020-01-12 – 2020-01-13 (×4): 750 mg via INTRAVENOUS
  Filled 2020-01-12 (×5): qty 150

## 2020-01-12 MED ORDER — FAMOTIDINE IN NACL 20-0.9 MG/50ML-% IV SOLN
20.0000 mg | Freq: Once | INTRAVENOUS | Status: DC | PRN
  Filled 2020-01-12: qty 50

## 2020-01-12 MED ORDER — ALBUTEROL SULFATE HFA 108 (90 BASE) MCG/ACT IN AERS
2.0000 | INHALATION_SPRAY | Freq: Once | RESPIRATORY_TRACT | Status: DC | PRN
  Filled 2020-01-12: qty 6.7

## 2020-01-12 MED ORDER — LACTATED RINGERS IV BOLUS
1000.0000 mL | Freq: Once | INTRAVENOUS | Status: AC
  Administered 2020-01-12: 04:00:00 1000 mL via INTRAVENOUS

## 2020-01-12 MED ORDER — ACETAMINOPHEN 325 MG PO TABS: 650.0000 mg | ORAL_TABLET | Freq: Four times a day (QID) | ORAL | Status: DC | PRN

## 2020-01-12 MED ORDER — ACETAMINOPHEN 650 MG RE SUPP: 650.0000 mg | Freq: Four times a day (QID) | RECTAL | Status: DC | PRN

## 2020-01-12 MED ORDER — INSULIN ASPART 100 UNIT/ML ~~LOC~~ SOLN
0.0000 [IU] | Freq: Three times a day (TID) | SUBCUTANEOUS | Status: DC
  Administered 2020-01-12: 23:00:00 2 [IU] via SUBCUTANEOUS
  Administered 2020-01-12 – 2020-01-13 (×4): 1 [IU] via SUBCUTANEOUS

## 2020-01-12 MED ORDER — METHYLPREDNISOLONE SODIUM SUCC 125 MG IJ SOLR: 125.0000 mg | Freq: Once | INTRAMUSCULAR | Status: DC | PRN

## 2020-01-12 MED ORDER — SODIUM CHLORIDE 0.9 % IV SOLN
Freq: Once | INTRAVENOUS | Status: AC
  Filled 2020-01-12: qty 20

## 2020-01-12 MED ORDER — PRAVASTATIN SODIUM 10 MG PO TABS
20.0000 mg | ORAL_TABLET | Freq: Every day | ORAL | Status: DC
  Administered 2020-01-12 – 2020-01-13 (×2): 20 mg via ORAL
  Filled 2020-01-12 (×2): qty 2

## 2020-01-12 MED ORDER — POTASSIUM CHLORIDE CRYS ER 20 MEQ PO TBCR
20.0000 meq | EXTENDED_RELEASE_TABLET | Freq: Once | ORAL | Status: AC
  Administered 2020-01-12: 09:00:00 20 meq via ORAL
  Filled 2020-01-12: qty 1

## 2020-01-12 MED ORDER — FERROUS SULFATE 325 (65 FE) MG PO TABS
325.0000 mg | ORAL_TABLET | Freq: Every day | ORAL | Status: DC
  Administered 2020-01-12 – 2020-01-13 (×2): 325 mg via ORAL
  Filled 2020-01-12 (×2): qty 1

## 2020-01-12 MED ORDER — SODIUM CHLORIDE 0.9 % IV SOLN: 2.0000 g | Freq: Once | INTRAVENOUS | Status: DC

## 2020-01-12 MED ORDER — HYDRALAZINE HCL 20 MG/ML IJ SOLN: 10.0000 mg | INTRAMUSCULAR | Status: DC | PRN

## 2020-01-12 MED ORDER — SODIUM CHLORIDE 0.9 % IV SOLN: INTRAVENOUS | Status: DC | PRN

## 2020-01-12 MED ORDER — HYDRALAZINE HCL 50 MG PO TABS
50.0000 mg | ORAL_TABLET | Freq: Three times a day (TID) | ORAL | Status: DC
  Administered 2020-01-12 – 2020-01-13 (×3): 50 mg via ORAL
  Filled 2020-01-12: qty 1
  Filled 2020-01-12 (×2): qty 2

## 2020-01-12 MED ORDER — LOSARTAN POTASSIUM 50 MG PO TABS: 50.0000 mg | ORAL_TABLET | Freq: Every day | ORAL | Status: DC

## 2020-01-12 NOTE — Consult Note (Signed)
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 Conjunctivae normal.  Cardiovascular:  Rate and Rhythm: Normal rate and regular rhythm.  Pulmonary:     Effort: Pulmonary effort is normal. No respiratory distress.  Musculoskeletal:     Cervical back: Normal range of motion.     Comments: Right shoulder, elbow, wrist, digits- Mild erythema dorsum of hand over 2,3 MC, no fluctuance, nontender, no instability, no blocks to motion  Sens  Ax/R/M/U intact  Mot   Ax/ R/ PIN/ M/ AIN/ U absent  Rad 2+  Skin:    General: Skin is warm and dry.  Neurological:     Mental Status: He is alert.  Psychiatric:        Mood and Affect: Mood and  affect normal.        Behavior: Behavior normal.     Assessment/Plan: Right hand cellulitis -- There does not appear to be a surgical indication. Would continue abx and feel from the hand standpoint he could be transitioned to oral medication and allowed to f/u with dermatology as scheduled. Covid infection    Lisette Abu, PA-C Orthopedic Surgery [phone] 01/12/2020, 10:41 AM

## 2020-01-12 NOTE — ED Notes (Signed)
Lunch Tray Ordered @ 1029. 

## 2020-01-12 NOTE — ED Notes (Signed)
Assisted pt with dinner. Pt ate most of potatoes and all of his grapes. Pt drank two cups of water, a carton of milk, and a cup of unsweetened tea.

## 2020-01-12 NOTE — Progress Notes (Signed)
Pharmacy COVID-19 Monoclonal Antibody Screening  [name] [name] was identified as being not hospitalized with symptoms from Covid-19 on admission but an incidental positive PCR has been documented.  The patient may qualify for the use of monoclonal antibodies (mAB) for COVID-19 viral infection to prevent worsening symptoms stemming from Covid-19 infection.  The patient was identified based on a positive COVID-19 PCR and not requiring the use of supplemental oxygen at this time.  This patient meets the FDA criteria for Emergency Use Authorization of casirivimab/imdevimab or bamlanivimab/etesevimab.  Has a (+) direct SARS-CoV-2 viral test result  Is NOT hospitalized due to COVID-19  Is within 10 days of symptom onset  Has at least one of the high risk factor(s) for progression to severe COVID-19 and/or hospitalization as defined in EUA.  Specific high risk criteria : Older age (>/= 74 yo), Diabetes and Cardiovascular disease or hypertension  Additionally: The patient has not had a positive COVID-19 PCR in the last 90 days.  The patient is unvaccinated against COVID-19.  Since the patient is unvaccinated and meets high risk criteria, the patient is eligible for mAB administration.   This eligibility and indication for treatment was discussed with the patient's physician: [place]: Based on the above discussion, it was decided that the patient will receive one dose of the available COVID-19 mAB combination. Pharmacy will coordinate administration timing with patient's nurse. Recommended infusion monitoring parameters communicated to the nursing team.   Margretta Sidle Dohlen 01/12/2020  9:12 AM

## 2020-01-12 NOTE — Progress Notes (Addendum)
[name] [name], is a 74 y.o. male, DOB - [birth year], GEX:[id number] -   admitted few hours ago by my partner, brief summary below  89 yr old quadriplegic man with chronic diffuse skin blisters, seeing dermatologist at [facility] without a definite diagnosis - getting W, Care at home by daughter.  He was brought to the hospital when daughter was having problems doing his dressing changes and she also thought that his right hand could be slightly swollen.  In the ER he was found to be incidentally Covid positive, he is not vaccinated for Covid.    1.  Possible cellulitis of both lower extremities and right hand.  This is early with no fluid relation, CT of both lower extremities and right hand does not show any abscess.  Follow cultures, trend procalcitonin and WBC, check MRSA nasal PCR.  For now continue empiric antibiotic started in the ER.  I personally do not think this is an acute issue, if he is stable he can be switched to oral doxycycline with outpatient dermatology follow-up which is pending and [place].  His case was discussed by me with hand surgeon Dr. Burney Gauze who suggested supportive care with outpatient follow-up with him in the office.  2.  Chronic skin blisters took a detailed history possibly linked to ARB.  ARB stopped, wound care and supportive care as above along with antibiotics.  Outpatient dermatology follow-up at [facility].  3.  Quadriplegia bedbound.  Lives at home with daughter.  Supportive care  4.  Dehydration.  Hydrate with IV fluids  5.  Hypokalemia.  Replaced.  6.  Dyslipidemia.  On statin.  7.  Hypertension.  Medications adjusted, ARB stopped due to possible schedule ulcers.  8.  Incidental Covid positive.  Discussed with daughter, antibody infusion now.  Monitor.  9.  Anaemia of chronic disease.  Stable.    10. DM type II.  Sliding scale and monitor.   DW daughter  - [phone] - 01/12/20  Vitals:   01/12/20 0615 01/12/20 0700 01/12/20 0715 01/12/20 0843  BP: (!) 166/95 (!) 162/91 (!) 142/82   Pulse: (!) 103 94 97   Resp: (!) 22 19 17    Temp:    (!) 100.7 F (38.2 C)  TempSrc:    Oral  SpO2: 100% 97% 97%   Weight:      Height:       ROS -  Patient in bed, appears comfortable, denies any headache, no fever, no chest pain or pressure, no shortness of breath , no abdominal pain. No focal weakness.  Exam  Awake Alert, diplegic at baseline, pro-form boots in both lower extremities, right wrist drop, multiple blisters both lower extremities and right arm, [place].AT,PERRAL Supple Neck,No JVD, No cervical lymphadenopathy appriciated.  Symmetrical Chest wall movement, Good air movement bilaterally, CTAB RRR,No Gallops, Rubs or new Murmurs, No Parasternal Heave +ve B.Sounds, Abd Soft, No tenderness, No organomegaly appriciated, No rebound - guarding or rigidity. No Cyanosis, Clubbing or edema, No new Rash or bruise  Data Review   Micro Results Recent Results (from the past 240 hour(s))  Resp Panel by RT-PCR (Flu A&B, Covid) Nasopharyngeal Swab     Status: Abnormal   Collection Time: 01/11/20  6:06 PM   Specimen: Nasopharyngeal Swab; Nasopharyngeal(NP) swabs in vial transport medium  Result Value Ref Range Status   SARS Coronavirus 2 by RT PCR POSITIVE (A) NEGATIVE Final    Comment: RESULT CALLED TO, READ BACK BY AND VERIFIED WITH: Thana Ates RN [phone]  01/11/20 A BROWNING (NOTE) SARS-CoV-2 target nucleic acids are DETECTED.  The SARS-CoV-2 RNA is generally detectable in upper respiratory specimens during the acute phase of infection. Positive results are indicative of the presence of the identified virus, but do not rule out bacterial infection or co-infection with other pathogens not detected by the test. Clinical correlation with patient history and other diagnostic information is necessary to determine patient infection status. The expected  result is Negative.  Fact Sheet for Patients: EntrepreneurPulse.com.au  Fact Sheet for Healthcare Providers: IncredibleEmployment.be  This test is not yet approved or cleared by the [place] FDA and  has been authorized for detection and/or diagnosis of SARS-CoV-2 by FDA under an Emergency Use Authorization (EUA).  This EUA will remain in effect (meaning this test ca n be used) for the duration of  the COVID-19 declaration under Section 564(b)(1) of the Act, 21 U.S.C. section 360bbb-3(b)(1), unless the authorization is terminated or revoked sooner.     Influenza A by PCR NEGATIVE NEGATIVE Final   Influenza B by PCR NEGATIVE NEGATIVE Final    Comment: (NOTE) The Xpert Xpress SARS-CoV-2/FLU/RSV plus assay is intended as an aid in the diagnosis of influenza from Nasopharyngeal swab specimens and should not be used as a sole basis for treatment. Nasal washings and aspirates are unacceptable for Xpert Xpress SARS-CoV-2/FLU/RSV testing.  Fact Sheet for Patients: EntrepreneurPulse.com.au  Fact Sheet for Healthcare Providers: IncredibleEmployment.be  This test is not yet approved or cleared by the [place] FDA and has been authorized for detection and/or diagnosis of SARS-CoV-2 by FDA under an Emergency Use Authorization (EUA). This EUA will remain in effect (meaning this test can be used) for the duration of the COVID-19 declaration under Section 564(b)(1) of the Act, 21 U.S.C. section 360bbb-3(b)(1), unless the authorization is terminated or revoked.  Performed at [facility], [place] [address], [place], [place] [address]   C Difficile Quick Screen w PCR reflex     Status: None   Collection Time: 01/12/20  6:24 AM   Specimen: STOOL  Result Value Ref Range Status   C Diff antigen NEGATIVE NEGATIVE Final   C Diff toxin NEGATIVE NEGATIVE Final   C Diff interpretation No C. difficile detected.   Final    Comment: Performed at [facility], [place] [address], [place], [place] [address]    Radiology Reports DG Chest 2 View  Result Date: 01/11/2020 CLINICAL DATA:  Purulent drainage from extremities, sepsis EXAM: CHEST - 2 VIEW COMPARISON:  11/28/2017 FINDINGS: Frontal and lateral views of the chest demonstrate an unremarkable cardiac silhouette. The lung volumes are diminished, with subsegmental atelectasis noted at the lung bases. No airspace disease, effusion, or pneumothorax. No acute bony abnormalities. IMPRESSION: 1. Hypoventilatory changes, with bibasilar atelectasis. No acute intrathoracic process. Electronically Signed   By: Randa Ngo M.D.   On: 01/11/2020 17:35   DG Tibia/Fibula Left  Result Date: 01/11/2020 CLINICAL DATA:  Drainage, suspected sepsis EXAM: LEFT TIBIA AND FIBULA - 2 VIEW COMPARISON:  None. FINDINGS: Frontal and lateral views of the left tibia and fibula are obtained. Bones are diffusely osteopenic. There are no acute or destructive bony lesions. Mild osteoarthritis of the left knee, ankle, and hindfoot. There is diffuse subcutaneous edema. IMPRESSION: 1. Diffuse subcutaneous edema. 2. Osteopenia.  No acute or destructive bony abnormality. Electronically Signed   By: Randa Ngo M.D.   On: 01/11/2020 17:38   DG Tibia/Fibula Right  Result Date: 01/11/2020 CLINICAL DATA:  Drainage, suspected  sepsis EXAM: RIGHT TIBIA AND FIBULA - 2 VIEW COMPARISON:  02/21/2013 FINDINGS: Frontal and lateral views of the right tibia and fibula are obtained. Bones are diffusely osteopenic. There are no acute or destructive bony lesions. Osteoarthritis of the right knee, ankle, and hindfoot. Diffuse subcutaneous edema. No radiographic evidence of osteomyelitis. IMPRESSION: 1. Diffuse subcutaneous edema. 2. Osteopenia.  No acute or destructive bony lesions. Electronically Signed   By: Randa Ngo M.D.   On: 01/11/2020 17:39   CT Hand Right Wo Contrast  Result Date:  01/12/2020 CLINICAL DATA:  Swelling with pain and EXAM: CT OF THE RIGHT HAND WITHOUT CONTRAST TECHNIQUE: Multidetector CT imaging of the right hand was performed according to the standard protocol. Multiplanar CT image reconstructions were also generated. COMPARISON:  None. FINDINGS: The study is extremely limited due to technique and patient positioning. Bones/Joint/Cartilage No fracture or dislocation. There is diffuse osteopenia. No definite areas of cortical destruction or periosteal reaction are seen. Ligaments Suboptimally assessed by CT. Muscles and Tendons The visualized portion of the muscles appear to be grossly intact there is however atrophy seen within the muscles of the hand. The visualized portion of the flexor extensor tendons appear to be grossly intact. Soft tissues On the palmar surface of the hand there is area of superficial ulceration with skin thickening. No definite loculated fluid collection is seen. IMPRESSION: Limited examination. On the palmar surface of the hand area of superficial ulceration with skin thickening. No definite loculated fluid collection. Diffuse osteopenia, no definite acute osseous abnormality. Electronically Signed   By: Prudencio Pair M.D.   On: 01/12/2020 00:05   DG Hand 2 View Right  Result Date: 01/11/2020 CLINICAL DATA:  Pus draining from right hand, sepsis EXAM: RIGHT HAND - 2 VIEW COMPARISON:  06/17/2019 FINDINGS: Frontal and lateral views of the right hand are obtained. Evaluation is extremely limited due to flexion deformity. The bones are diffusely osteopenic. There is subcutaneous gas and dorsal margin distal aspect second digit. I do not see any gross bony destruction on this limited evaluation. IMPRESSION: 1. Subcutaneous gas and soft tissue swelling distal aspect second digit. No radiographic evidence of osteomyelitis. 2. Osteopenia. 3. Limited study due to patient positioning and flexion deformity. Electronically Signed   By: Randa Ngo M.D.   On:  01/11/2020 17:36   DG Hand 2 View Left  Result Date: 01/11/2020 CLINICAL DATA:  Posture ending from left hand, suspected sepsis EXAM: LEFT HAND - 2 VIEW COMPARISON:  None. FINDINGS: Frontal and lateral views of the left hand are obtained. Evaluation is limited by flexion deformities of the digits. Bones are osteopenic. There are no acute or destructive bony abnormalities. No radiographic evidence of osteomyelitis. Vascular calcifications are seen. Otherwise the soft tissues are unremarkable. IMPRESSION: 1. Limited study due to flexion deformities of the digits. 2. No acute or destructive bony lesion. 3. Osteopenia. Electronically Signed   By: Randa Ngo M.D.   On: 01/11/2020 17:37   CT FEMUR LEFT WO CONTRAST  Result Date: 01/12/2020 CLINICAL DATA:  Multiple wounds on both legs. Bilateral lower extremity swelling for several weeks. EXAM: CT OF THE LOWER BILATERAL EXTREMITY WITHOUT CONTRAST TECHNIQUE: Multidetector CT imaging of the lower bilateral extremity was performed according to the standard protocol. COMPARISON:  Plain films of the lower legs 01/11/2020. FINDINGS: Bones/Joint/Cartilage There is no CT evidence of osteomyelitis. No acute bony or joint abnormality is identified. Bones appear somewhat osteopenic. No focal lesion. Ligaments Suboptimally assessed by CT. Muscles and Tendons No intramuscular  fluid collection is seen. Fat planes within muscle are preserved. No evidence of tear or strain. No mass. There is some fatty atrophy of musculature bilaterally which appears symmetric. No gas within muscle or tracking along fascial planes. Soft tissues There is extensive stranding in subcutaneous fatty tissues bilaterally. Scattered skin wounds are noted. No focal fluid collection is identified. IMPRESSION: Marked stranding in subcutaneous fatty tissues bilaterally consistent with cellulitis and/or edema. Scattered skin wounds are seen. Negative for CT evidence of osteomyelitis, septic joint or  myositis. Electronically Signed   By: Inge Rise M.D.   On: 01/12/2020 08:21   CT FEMUR RIGHT WO CONTRAST  Result Date: 01/12/2020 CLINICAL DATA:  Multiple wounds on both legs. Bilateral lower extremity swelling for several weeks. EXAM: CT OF THE LOWER BILATERAL EXTREMITY WITHOUT CONTRAST TECHNIQUE: Multidetector CT imaging of the lower bilateral extremity was performed according to the standard protocol. COMPARISON:  Plain films of the lower legs 01/11/2020. FINDINGS: Bones/Joint/Cartilage There is no CT evidence of osteomyelitis. No acute bony or joint abnormality is identified. Bones appear somewhat osteopenic. No focal lesion. Ligaments Suboptimally assessed by CT. Muscles and Tendons No intramuscular fluid collection is seen. Fat planes within muscle are preserved. No evidence of tear or strain. No mass. There is some fatty atrophy of musculature bilaterally which appears symmetric. No gas within muscle or tracking along fascial planes. Soft tissues There is extensive stranding in subcutaneous fatty tissues bilaterally. Scattered skin wounds are noted. No focal fluid collection is identified. IMPRESSION: Marked stranding in subcutaneous fatty tissues bilaterally consistent with cellulitis and/or edema. Scattered skin wounds are seen. Negative for CT evidence of osteomyelitis, septic joint or myositis. Electronically Signed   By: Inge Rise M.D.   On: 01/12/2020 08:21   CT TIBIA FIBULA LEFT WO CONTRAST  Result Date: 01/12/2020 CLINICAL DATA:  Multiple wounds on both legs. Bilateral lower extremity swelling for several weeks. EXAM: CT OF THE LOWER BILATERAL EXTREMITY WITHOUT CONTRAST TECHNIQUE: Multidetector CT imaging of the lower bilateral extremity was performed according to the standard protocol. COMPARISON:  Plain films of the lower legs 01/11/2020. FINDINGS: Bones/Joint/Cartilage There is no CT evidence of osteomyelitis. No acute bony or joint abnormality is identified. Bones appear  somewhat osteopenic. No focal lesion. Ligaments Suboptimally assessed by CT. Muscles and Tendons No intramuscular fluid collection is seen. Fat planes within muscle are preserved. No evidence of tear or strain. No mass. There is some fatty atrophy of musculature bilaterally which appears symmetric. No gas within muscle or tracking along fascial planes. Soft tissues There is extensive stranding in subcutaneous fatty tissues bilaterally. Scattered skin wounds are noted. No focal fluid collection is identified. IMPRESSION: Marked stranding in subcutaneous fatty tissues bilaterally consistent with cellulitis and/or edema. Scattered skin wounds are seen. Negative for CT evidence of osteomyelitis, septic joint or myositis. Electronically Signed   By: Inge Rise M.D.   On: 01/12/2020 08:21   CT TIBIA FIBULA RIGHT WO CONTRAST  Result Date: 01/12/2020 CLINICAL DATA:  Multiple wounds on both legs. Bilateral lower extremity swelling for several weeks. EXAM: CT OF THE LOWER BILATERAL EXTREMITY WITHOUT CONTRAST TECHNIQUE: Multidetector CT imaging of the lower bilateral extremity was performed according to the standard protocol. COMPARISON:  Plain films of the lower legs 01/11/2020. FINDINGS: Bones/Joint/Cartilage There is no CT evidence of osteomyelitis. No acute bony or joint abnormality is identified. Bones appear somewhat osteopenic. No focal lesion. Ligaments Suboptimally assessed by CT. Muscles and Tendons No intramuscular fluid collection is seen. Fat planes within muscle  are preserved. No evidence of tear or strain. No mass. There is some fatty atrophy of musculature bilaterally which appears symmetric. No gas within muscle or tracking along fascial planes. Soft tissues There is extensive stranding in subcutaneous fatty tissues bilaterally. Scattered skin wounds are noted. No focal fluid collection is identified. IMPRESSION: Marked stranding in subcutaneous fatty tissues bilaterally consistent with cellulitis  and/or edema. Scattered skin wounds are seen. Negative for CT evidence of osteomyelitis, septic joint or myositis. Electronically Signed   By: Inge Rise M.D.   On: 01/12/2020 08:21   DG Foot 2 Views Left  Result Date: 01/11/2020 CLINICAL DATA:  Pus draining from left foot, sepsis EXAM: LEFT FOOT - 2 VIEW COMPARISON:  None. FINDINGS: Frontal and lateral views of the left foot are obtained. The bones are diffusely osteopenic. No acute or destructive bony lesions. No radiographic evidence of osteomyelitis. Diffuse soft tissue edema, with extensive vascular calcifications. IMPRESSION: 1. Diffuse soft tissue edema. 2. Extensive osteopenia, with no acute or destructive bony lesions. Electronically Signed   By: Randa Ngo M.D.   On: 01/11/2020 17:42   DG Foot 2 Views Right  Result Date: 01/11/2020 CLINICAL DATA:  Sepsis, pus draining from right foot EXAM: RIGHT FOOT - 2 VIEW COMPARISON:  02/21/2013 FINDINGS: Frontal and lateral views of the right foot are obtained. The bones are diffusely osteopenic. Likely necrotic soft tissues of the distal margin right first digit. I do not see any acute or destructive bony lesions. There is diffuse subcutaneous edema, with extensive vascular calcifications. IMPRESSION: 1. Likely necrotic soft tissues of the first digit. No radiographic evidence of osteomyelitis. 2. Diffuse osteopenia. 3. Diffuse subcutaneous edema. Electronically Signed   By: Randa Ngo M.D.   On: 01/11/2020 17:41    CBC Recent Labs  Lab 01/11/20 1617 01/11/20 1630  WBC 8.9  --   HGB 10.1* 10.2*  HCT 32.2* 30.0*  PLT 272  --   MCV 97.3  --   MCH 30.5  --   MCHC 31.4  --   RDW 14.4  --   LYMPHSABS 1.0  --   MONOABS 0.5  --   EOSABS 0.0  --   BASOSABS 0.0  --     Chemistries  Recent Labs  Lab 01/11/20 1630 01/12/20 0135  NA 139 136  K 2.8* 3.2*  CL  --  102  CO2  --  20*  GLUCOSE  --  123*  BUN  --  18  CREATININE  --  0.89  CALCIUM  --  8.1*  AST  --  20  ALT   --  7  ALKPHOS  --  80  BILITOT  --  0.8   ------------------------------------------------------------------------------------------------------------------ estimated creatinine clearance is 77.6 mL/min (by C-G formula based on SCr of 0.89 mg/dL). ------------------------------------------------------------------------------------------------------------------ No results for input(s): HGBA1C in the last 72 hours. ------------------------------------------------------------------------------------------------------------------ No results for input(s): CHOL, HDL, LDLCALC, TRIG, CHOLHDL, LDLDIRECT in the last 72 hours. ------------------------------------------------------------------------------------------------------------------ No results for input(s): TSH, T4TOTAL, T3FREE, THYROIDAB in the last 72 hours.  Invalid input(s): FREET3 ------------------------------------------------------------------------------------------------------------------ No results for input(s): VITAMINB12, FOLATE, FERRITIN, TIBC, IRON, RETICCTPCT in the last 72 hours.  Coagulation profile Recent Labs  Lab 01/11/20 1617  INR 1.0    No results for input(s): DDIMER in the last 72 hours.  Cardiac Enzymes No results for input(s): CKMB, TROPONINI, MYOGLOBIN in the last 168 hours.  Invalid input(s): CK ------------------------------------------------------------------------------------------------------------------ Invalid input(s): POCBNP   Signature  Lala Lund M.D on 01/12/2020 at 8:47  AM   -  To page go to [email or web address]

## 2020-01-12 NOTE — ED Notes (Signed)
Helped sit pt up in bed and placed tray within reach. Helped set up tray for pt to eat.

## 2020-01-12 NOTE — ED Notes (Signed)
Critical lactic reported to Lyndel Safe, MD
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
## 2020-01-12 NOTE — Progress Notes (Signed)
Pharmacy Antibiotic Note  [name] [name] is a 74 y.o. male admitted on 01/11/2020 with cellulitis.  Pharmacy has been consulted for vancomycin and cefepime dosing.  Plan: Vancomycin 1750mg  x1 then 750mg  IV every 12 hours.  Goal trough 10-15 mcg/mL.  Cefepime 2g IV every 8 hours.  Height: 5\' 11"  (180.3 cm) Weight: 80 kg (176 lb 5.9 oz) IBW/kg (Calculated) : 75.3  Temp (24hrs), Avg:100.5 F (38.1 C), Min:100.5 F (38.1 C), Max:100.5 F (38.1 C)  Recent Labs  Lab 01/11/20 1617 01/11/20 1625 01/12/20 0135 01/12/20 0136  WBC 8.9  --   --   --   CREATININE  --   --  0.89  --   LATICACIDVEN  --  1.9  --  2.2*    Estimated Creatinine Clearance: 77.6 mL/min (by C-G formula based on SCr of 0.89 mg/dL).    Allergies  Allergen Reactions  . Lisinopril Other (See Comments)    Hyperkalemia.    Thank you for allowing pharmacy to be a part of this patient's care.  Wynona Neat, PharmD, BCPS  01/12/2020 3:15 AM

## 2020-01-12 NOTE — ED Notes (Signed)
Spoke to daughter, [name], on the phone who said she is his medical POA. She said the doctor spoke to her about the antibodies on the phone earlier and she is okay with pt being treated with them.

## 2020-01-12 NOTE — ED Notes (Signed)
SDU Breakfast Ordered 

## 2020-01-12 NOTE — ED Notes (Signed)
IV team at bedside 

## 2020-01-12 NOTE — Progress Notes (Signed)
Responded to consult for IV. Pt refusing. Explained reason for IV; pt continues to decline.

## 2020-01-12 NOTE — Progress Notes (Signed)
Bilateral venous was not performed due to patients inability to tolerate compression. Patient has bilateral LE open wounds with edema.

## 2020-01-12 NOTE — ED Notes (Signed)
Public affairs consultant Ordered @ [phone]

## 2020-01-13 DIAGNOSIS — A419 Sepsis, unspecified organism: Secondary | ICD-10-CM | POA: Diagnosis not present

## 2020-01-13 LAB — GLUCOSE, CAPILLARY
Glucose-Capillary: 128 mg/dL — ABNORMAL HIGH (ref 70–99)
Glucose-Capillary: 141 mg/dL — ABNORMAL HIGH (ref 70–99)
Glucose-Capillary: 140 mg/dL — ABNORMAL HIGH (ref 70–99)
Glucose-Capillary: 171 mg/dL — ABNORMAL HIGH (ref 70–99)

## 2020-01-13 MED ORDER — AMOXICILLIN-POT CLAVULANATE 875-125 MG PO TABS: 1.0000 | ORAL_TABLET | Freq: Two times a day (BID) | ORAL | 0 refills | Status: AC

## 2020-01-13 MED ORDER — POTASSIUM CHLORIDE CRYS ER 20 MEQ PO TBCR
40.0000 meq | EXTENDED_RELEASE_TABLET | Freq: Once | ORAL | Status: AC
  Administered 2020-01-13: 12:00:00 40 meq via ORAL
  Filled 2020-01-13: qty 2

## 2020-01-13 MED ORDER — DOXYCYCLINE MONOHYDRATE 100 MG PO TABS: 100.0000 mg | ORAL_TABLET | Freq: Two times a day (BID) | ORAL | 0 refills | Status: AC

## 2020-01-13 NOTE — Discharge Summary (Signed)
Physician Discharge Summary  [name] [name] LGX:[id number] DOB: [birth year] DOA: 01/11/2020  PCP: [name] Salts, MD  Admit date: 01/11/2020 Discharge date: 01/13/2020  Admitted From: Home Disposition: Home  Recommendations for Outpatient Follow-up:  1. Follow up with PCP in 1-2 weeks 2. Follow-up with your dermatologist in 1 to 2 weeks 3. Please obtain BMP/CBC in one week 4. Please follow up with your PCP on the following pending results: Unresulted Labs (From admission, onward)          Start     Ordered   01/13/20 0500  CBC with Differential/Platelet  Daily,   R      01/12/20 0313   01/13/20 0500  Comprehensive metabolic panel  Daily,   R      01/12/20 0313   01/13/20 0500  C-reactive protein  Daily,   R      01/12/20 0313   01/13/20 0500  D-dimer, quantitative (not at [facility])  Daily,   R      01/12/20 0313   01/13/20 0500  Lactic acid, plasma  Tomorrow morning,   STAT       Question:  Specimen collection method  Answer:  Lab=Lab collect   01/12/20 0659   01/13/20 0500  Brain natriuretic peptide  Daily,   R     Question:  Specimen collection method  Answer:  Lab=Lab collect   01/12/20 0659   01/13/20 0500  Magnesium  Daily,   R     Question:  Specimen collection method  Answer:  Lab=Lab collect   01/12/20 0659   01/13/20 0500  Procalcitonin  Daily,   R     Question:  Specimen collection method  Answer:  Lab=Lab collect   01/12/20 0659   01/12/20 0850  MRSA PCR Screening  ONCE - STAT,   STAT       Question Answer Comment  Patient immune status Normal   Release to patient Immediate      01/12/20 0849   01/11/20 1625  Culture, blood (Routine x 2)  BLOOD CULTURE X 2,   STAT      01/11/20 1624           Home Health: None Equipment/Devices: None  Discharge Condition: Stable CODE STATUS: Full code Diet recommendation: Cardiac  Subjective: Seen and examined.  Feels much better.  No complaints.  Wants to go home.  HPI: [name] [name] is a 74 y.o. male with history of  quadriplegia status post multiple accident with history of diabetes mellitus, hypertension, chronic anemia was brought to the ER after patient had increasing swelling of the right hand over the last few days.  Patient's daughter also noted that patient has been having upper respiratory tract symptoms over the last few days.  Also has been in chronic swelling of the both lower extremities with some discharge.  Has multiple wounds on all extremities.  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  ED Course: In the ER patient was tachycardic febrile with temperature 100.5.  Right hand looks mildly swollen with erythema no active discharge on my exam.  Has contracture.  X-ray was showing possible gas but CT scan confirms no collection of fluid.  X-ray of the lower extremities shows subcutaneous edema.  Has multiple wounds on all extremities.  Covid test was positive but chest x-ray did not show any infiltrates and patient was not hypoxic.  Patient's lactic acid was elevated.  Was started on fluid bolus and empiric antibiotic for sepsis likely from skin infection involving  the extremities.  Brief/Interim Summary: Patient was admitted under hospital service with right hand cellulitis and possibly cellulitis of bilateral lower extremities however he has chronic wounds on the right hand as well as bilateral lower extremities.  Patient underwent CT scan of bilateral tibia and fibula which showed cellulitis but no abscess.  CT hand on the right side also did not show any abscess.  He was started on broad-spectrum IV antibiotics which included Zosyn and vancomycin.  Blood cultures were drawn which remained negative so far.  He was tested negative for C. difficile.  He was evaluated by orthopedic as well and they did not recommend any surgical intervention and recommended discharging patient on oral antibiotics.  Patient was tested positive for COVID-19 incidentally for which he received antibiotic infusion.   Patient has no symptoms regarding COVID-19.  He also had mild hypokalemia, potassium was replaced.  Patient will be discharged in stable condition on 7 more days of oral Augmentin in combination with oral doxycycline per pharmacy recommendation.  I have discussed his discharge plan with patient's daughter [name] [name] who is in complete agreement.  Discharge Diagnoses:  Principal Problem:   Sepsis ([place]) Active Problems:   Lower urinary tract infectious disease   Essential hypertension   Diabetes mellitus without complication ([place])   Wounds, multiple   Cellulitis of multiple sites   COVID-19 virus infection    Discharge Instructions   Allergies as of 01/13/2020      Reactions   Lisinopril Other (See Comments)   Hyperkalemia.      Medication List    TAKE these medications   amoxicillin-clavulanate 875-125 MG tablet Commonly known as: Augmentin Take 1 tablet by mouth 2 (two) times daily for 7 days.   aspirin EC 325 MG tablet Take 325 mg by mouth every 8 (eight) hours as needed (pain).   doxycycline 100 MG tablet Commonly known as: ADOXA Take 1 tablet (100 mg total) by mouth 2 (two) times daily for 7 days.   losartan 50 MG tablet Commonly known as: COZAAR Take 50 mg by mouth daily.   metFORMIN 500 MG 24 hr tablet Commonly known as: GLUCOPHAGE-XR Take 1 tablet (500 mg total) by mouth daily with breakfast.   pravastatin 20 MG tablet Commonly known as: PRAVACHOL Take 20 mg by mouth daily.   Slow Release Iron 160 (50 Fe) MG Tbcr SR tablet Generic drug: ferrous sulfate Take 160 mg by mouth daily.   Vitamin D (Ergocalciferol) 1.25 MG (50000 UNIT) Caps capsule Commonly known as: DRISDOL Take 50,000 Units by mouth every Saturday.       Follow-up Information    [name] Salts, MD Follow up in 1 week(s).   Specialty: Internal Medicine Contact information: [address] ,[place] [address] [place] [address] [phone]              Allergies  Allergen  Reactions  . Lisinopril Other (See Comments)    Hyperkalemia.    Consultations: Orthopedic surgery   Procedures/Studies: DG Chest 2 View  Result Date: 01/11/2020 CLINICAL DATA:  Purulent drainage from extremities, sepsis EXAM: CHEST - 2 VIEW COMPARISON:  11/28/2017 FINDINGS: Frontal and lateral views of the chest demonstrate an unremarkable cardiac silhouette. The lung volumes are diminished, with subsegmental atelectasis noted at the lung bases. No airspace disease, effusion, or pneumothorax. No acute bony abnormalities. IMPRESSION: 1. Hypoventilatory changes, with bibasilar atelectasis. No acute intrathoracic process. Electronically Signed   By: Randa Ngo M.D.   On: 01/11/2020 17:35  DG Tibia/Fibula Left  Result Date: 01/11/2020 CLINICAL DATA:  Drainage, suspected sepsis EXAM: LEFT TIBIA AND FIBULA - 2 VIEW COMPARISON:  None. FINDINGS: Frontal and lateral views of the left tibia and fibula are obtained. Bones are diffusely osteopenic. There are no acute or destructive bony lesions. Mild osteoarthritis of the left knee, ankle, and hindfoot. There is diffuse subcutaneous edema. IMPRESSION: 1. Diffuse subcutaneous edema. 2. Osteopenia.  No acute or destructive bony abnormality. Electronically Signed   By: Randa Ngo M.D.   On: 01/11/2020 17:38   DG Tibia/Fibula Right  Result Date: 01/11/2020 CLINICAL DATA:  Drainage, suspected sepsis EXAM: RIGHT TIBIA AND FIBULA - 2 VIEW COMPARISON:  02/21/2013 FINDINGS: Frontal and lateral views of the right tibia and fibula are obtained. Bones are diffusely osteopenic. There are no acute or destructive bony lesions. Osteoarthritis of the right knee, ankle, and hindfoot. Diffuse subcutaneous edema. No radiographic evidence of osteomyelitis. IMPRESSION: 1. Diffuse subcutaneous edema. 2. Osteopenia.  No acute or destructive bony lesions. Electronically Signed   By: Randa Ngo M.D.   On: 01/11/2020 17:39   CT Hand Right Wo Contrast  Result Date:  01/12/2020 CLINICAL DATA:  Swelling with pain and EXAM: CT OF THE RIGHT HAND WITHOUT CONTRAST TECHNIQUE: Multidetector CT imaging of the right hand was performed according to the standard protocol. Multiplanar CT image reconstructions were also generated. COMPARISON:  None. FINDINGS: The study is extremely limited due to technique and patient positioning. Bones/Joint/Cartilage No fracture or dislocation. There is diffuse osteopenia. No definite areas of cortical destruction or periosteal reaction are seen. Ligaments Suboptimally assessed by CT. Muscles and Tendons The visualized portion of the muscles appear to be grossly intact there is however atrophy seen within the muscles of the hand. The visualized portion of the flexor extensor tendons appear to be grossly intact. Soft tissues On the palmar surface of the hand there is area of superficial ulceration with skin thickening. No definite loculated fluid collection is seen. IMPRESSION: Limited examination. On the palmar surface of the hand area of superficial ulceration with skin thickening. No definite loculated fluid collection. Diffuse osteopenia, no definite acute osseous abnormality. Electronically Signed   By: Prudencio Pair M.D.   On: 01/12/2020 00:05   DG Hand 2 View Right  Result Date: 01/11/2020 CLINICAL DATA:  Pus draining from right hand, sepsis EXAM: RIGHT HAND - 2 VIEW COMPARISON:  06/17/2019 FINDINGS: Frontal and lateral views of the right hand are obtained. Evaluation is extremely limited due to flexion deformity. The bones are diffusely osteopenic. There is subcutaneous gas and dorsal margin distal aspect second digit. I do not see any gross bony destruction on this limited evaluation. IMPRESSION: 1. Subcutaneous gas and soft tissue swelling distal aspect second digit. No radiographic evidence of osteomyelitis. 2. Osteopenia. 3. Limited study due to patient positioning and flexion deformity. Electronically Signed   By: Randa Ngo M.D.   On:  01/11/2020 17:36   DG Hand 2 View Left  Result Date: 01/11/2020 CLINICAL DATA:  Posture ending from left hand, suspected sepsis EXAM: LEFT HAND - 2 VIEW COMPARISON:  None. FINDINGS: Frontal and lateral views of the left hand are obtained. Evaluation is limited by flexion deformities of the digits. Bones are osteopenic. There are no acute or destructive bony abnormalities. No radiographic evidence of osteomyelitis. Vascular calcifications are seen. Otherwise the soft tissues are unremarkable. IMPRESSION: 1. Limited study due to flexion deformities of the digits. 2. No acute or destructive bony lesion. 3. Osteopenia. Electronically Signed  By: Randa Ngo M.D.   On: 01/11/2020 17:37   CT FEMUR LEFT WO CONTRAST  Result Date: 01/12/2020 CLINICAL DATA:  Multiple wounds on both legs. Bilateral lower extremity swelling for several weeks. EXAM: CT OF THE LOWER BILATERAL EXTREMITY WITHOUT CONTRAST TECHNIQUE: Multidetector CT imaging of the lower bilateral extremity was performed according to the standard protocol. COMPARISON:  Plain films of the lower legs 01/11/2020. FINDINGS: Bones/Joint/Cartilage There is no CT evidence of osteomyelitis. No acute bony or joint abnormality is identified. Bones appear somewhat osteopenic. No focal lesion. Ligaments Suboptimally assessed by CT. Muscles and Tendons No intramuscular fluid collection is seen. Fat planes within muscle are preserved. No evidence of tear or strain. No mass. There is some fatty atrophy of musculature bilaterally which appears symmetric. No gas within muscle or tracking along fascial planes. Soft tissues There is extensive stranding in subcutaneous fatty tissues bilaterally. Scattered skin wounds are noted. No focal fluid collection is identified. IMPRESSION: Marked stranding in subcutaneous fatty tissues bilaterally consistent with cellulitis and/or edema. Scattered skin wounds are seen. Negative for CT evidence of osteomyelitis, septic joint or  myositis. Electronically Signed   By: Inge Rise M.D.   On: 01/12/2020 08:21   CT FEMUR RIGHT WO CONTRAST  Result Date: 01/12/2020 CLINICAL DATA:  Multiple wounds on both legs. Bilateral lower extremity swelling for several weeks. EXAM: CT OF THE LOWER BILATERAL EXTREMITY WITHOUT CONTRAST TECHNIQUE: Multidetector CT imaging of the lower bilateral extremity was performed according to the standard protocol. COMPARISON:  Plain films of the lower legs 01/11/2020. FINDINGS: Bones/Joint/Cartilage There is no CT evidence of osteomyelitis. No acute bony or joint abnormality is identified. Bones appear somewhat osteopenic. No focal lesion. Ligaments Suboptimally assessed by CT. Muscles and Tendons No intramuscular fluid collection is seen. Fat planes within muscle are preserved. No evidence of tear or strain. No mass. There is some fatty atrophy of musculature bilaterally which appears symmetric. No gas within muscle or tracking along fascial planes. Soft tissues There is extensive stranding in subcutaneous fatty tissues bilaterally. Scattered skin wounds are noted. No focal fluid collection is identified. IMPRESSION: Marked stranding in subcutaneous fatty tissues bilaterally consistent with cellulitis and/or edema. Scattered skin wounds are seen. Negative for CT evidence of osteomyelitis, septic joint or myositis. Electronically Signed   By: Inge Rise M.D.   On: 01/12/2020 08:21   CT TIBIA FIBULA LEFT WO CONTRAST  Result Date: 01/12/2020 CLINICAL DATA:  Multiple wounds on both legs. Bilateral lower extremity swelling for several weeks. EXAM: CT OF THE LOWER BILATERAL EXTREMITY WITHOUT CONTRAST TECHNIQUE: Multidetector CT imaging of the lower bilateral extremity was performed according to the standard protocol. COMPARISON:  Plain films of the lower legs 01/11/2020. FINDINGS: Bones/Joint/Cartilage There is no CT evidence of osteomyelitis. No acute bony or joint abnormality is identified. Bones appear  somewhat osteopenic. No focal lesion. Ligaments Suboptimally assessed by CT. Muscles and Tendons No intramuscular fluid collection is seen. Fat planes within muscle are preserved. No evidence of tear or strain. No mass. There is some fatty atrophy of musculature bilaterally which appears symmetric. No gas within muscle or tracking along fascial planes. Soft tissues There is extensive stranding in subcutaneous fatty tissues bilaterally. Scattered skin wounds are noted. No focal fluid collection is identified. IMPRESSION: Marked stranding in subcutaneous fatty tissues bilaterally consistent with cellulitis and/or edema. Scattered skin wounds are seen. Negative for CT evidence of osteomyelitis, septic joint or myositis. Electronically Signed   By: Inge Rise M.D.   On: 01/12/2020  08:21   CT TIBIA FIBULA RIGHT WO CONTRAST  Result Date: 01/12/2020 CLINICAL DATA:  Multiple wounds on both legs. Bilateral lower extremity swelling for several weeks. EXAM: CT OF THE LOWER BILATERAL EXTREMITY WITHOUT CONTRAST TECHNIQUE: Multidetector CT imaging of the lower bilateral extremity was performed according to the standard protocol. COMPARISON:  Plain films of the lower legs 01/11/2020. FINDINGS: Bones/Joint/Cartilage There is no CT evidence of osteomyelitis. No acute bony or joint abnormality is identified. Bones appear somewhat osteopenic. No focal lesion. Ligaments Suboptimally assessed by CT. Muscles and Tendons No intramuscular fluid collection is seen. Fat planes within muscle are preserved. No evidence of tear or strain. No mass. There is some fatty atrophy of musculature bilaterally which appears symmetric. No gas within muscle or tracking along fascial planes. Soft tissues There is extensive stranding in subcutaneous fatty tissues bilaterally. Scattered skin wounds are noted. No focal fluid collection is identified. IMPRESSION: Marked stranding in subcutaneous fatty tissues bilaterally consistent with cellulitis  and/or edema. Scattered skin wounds are seen. Negative for CT evidence of osteomyelitis, septic joint or myositis. Electronically Signed   By: Inge Rise M.D.   On: 01/12/2020 08:21   DG Foot 2 Views Left  Result Date: 01/11/2020 CLINICAL DATA:  Pus draining from left foot, sepsis EXAM: LEFT FOOT - 2 VIEW COMPARISON:  None. FINDINGS: Frontal and lateral views of the left foot are obtained. The bones are diffusely osteopenic. No acute or destructive bony lesions. No radiographic evidence of osteomyelitis. Diffuse soft tissue edema, with extensive vascular calcifications. IMPRESSION: 1. Diffuse soft tissue edema. 2. Extensive osteopenia, with no acute or destructive bony lesions. Electronically Signed   By: Randa Ngo M.D.   On: 01/11/2020 17:42   DG Foot 2 Views Right  Result Date: 01/11/2020 CLINICAL DATA:  Sepsis, pus draining from right foot EXAM: RIGHT FOOT - 2 VIEW COMPARISON:  02/21/2013 FINDINGS: Frontal and lateral views of the right foot are obtained. The bones are diffusely osteopenic. Likely necrotic soft tissues of the distal margin right first digit. I do not see any acute or destructive bony lesions. There is diffuse subcutaneous edema, with extensive vascular calcifications. IMPRESSION: 1. Likely necrotic soft tissues of the first digit. No radiographic evidence of osteomyelitis. 2. Diffuse osteopenia. 3. Diffuse subcutaneous edema. Electronically Signed   By: Randa Ngo M.D.   On: 01/11/2020 17:41      Discharge Exam: Vitals:   01/13/20 0810 01/13/20 1117  BP: (!) 91/53 106/66  Pulse: 64 71  Resp:  19  Temp:  98.4 F (36.9 C)  SpO2:  99%   Vitals:   01/13/20 0456 01/13/20 0746 01/13/20 0810 01/13/20 1117  BP: (!) 147/81 (!) 84/60 (!) 91/53 106/66  Pulse: 78 73 64 71  Resp: 18 19  19   Temp: 98.7 F (37.1 C) 98 F (36.7 C)  98.4 F (36.9 C)  TempSrc: Oral Oral  Oral  SpO2: 98% 100%  99%  Weight: 84.5 kg     Height: 5\' 11"  (1.803 m)       General: Pt  is alert, awake, not in acute distress Cardiovascular: RRR, S1/S2 +, no rubs, no gallops Respiratory: CTA bilaterally, no wheezing, no rhonchi Abdominal: Soft, NT, ND, bowel sounds + Extremities: Multiple superficial blisters with hypo and hyperpigmentation involving all bilateral extremities and right hand.    The results of significant diagnostics from this hospitalization (including imaging, microbiology, ancillary and laboratory) are listed below for reference.     Microbiology: Recent Results (from the  past 240 hour(s))  Culture, blood (Routine x 2)     Status: None (Preliminary result)   Collection Time: 01/11/20  4:17 PM   Specimen: BLOOD LEFT ARM  Result Value Ref Range Status   Specimen Description BLOOD LEFT ARM  Final   Special Requests   Final    BOTTLES DRAWN AEROBIC AND ANAEROBIC Blood Culture adequate volume   Culture   Final    NO GROWTH 2 DAYS Performed at [facility], [address] [place], [place] [address]    Report Status PENDING  Incomplete  Urine culture     Status: None (Preliminary result)   Collection Time: 01/11/20  5:06 PM   Specimen: In/Out Cath Urine  Result Value Ref Range Status   Specimen Description IN/OUT CATH URINE  Final   Special Requests NONE  Final   Culture   Final    CULTURE REINCUBATED FOR BETTER GROWTH Performed at [facility], [place] [address] [place], [place] [address]    Report Status PENDING  Incomplete  Resp Panel by RT-PCR (Flu A&B, Covid) Nasopharyngeal Swab     Status: Abnormal   Collection Time: 01/11/20  6:06 PM   Specimen: Nasopharyngeal Swab; Nasopharyngeal(NP) swabs in vial transport medium  Result Value Ref Range Status   SARS Coronavirus 2 by RT PCR POSITIVE (A) NEGATIVE Final    Comment: RESULT CALLED TO, READ BACK BY AND VERIFIED WITHThana Ates RN 1939 01/11/20 A BROWNING (NOTE) SARS-CoV-2 target nucleic acids are DETECTED.  The SARS-CoV-2 RNA is generally detectable in upper  respiratory specimens during the acute phase of infection. Positive results are indicative of the presence of the identified virus, but do not rule out bacterial infection or co-infection with other pathogens not detected by the test. Clinical correlation with patient history and other diagnostic information is necessary to determine patient infection status. The expected result is Negative.  Fact Sheet for Patients: EntrepreneurPulse.com.au  Fact Sheet for Healthcare Providers: IncredibleEmployment.be  This test is not yet approved or cleared by the [place] FDA and  has been authorized for detection and/or diagnosis of SARS-CoV-2 by FDA under an Emergency Use Authorization (EUA).  This EUA will remain in effect (meaning this test ca n be used) for the duration of  the COVID-19 declaration under Section 564(b)(1) of the Act, 21 U.S.C. section 360bbb-3(b)(1), unless the authorization is terminated or revoked sooner.     Influenza A by PCR NEGATIVE NEGATIVE Final   Influenza B by PCR NEGATIVE NEGATIVE Final    Comment: (NOTE) The Xpert Xpress SARS-CoV-2/FLU/RSV plus assay is intended as an aid in the diagnosis of influenza from Nasopharyngeal swab specimens and should not be used as a sole basis for treatment. Nasal washings and aspirates are unacceptable for Xpert Xpress SARS-CoV-2/FLU/RSV testing.  Fact Sheet for Patients: EntrepreneurPulse.com.au  Fact Sheet for Healthcare Providers: IncredibleEmployment.be  This test is not yet approved or cleared by the [place] FDA and has been authorized for detection and/or diagnosis of SARS-CoV-2 by FDA under an Emergency Use Authorization (EUA). This EUA will remain in effect (meaning this test can be used) for the duration of the COVID-19 declaration under Section 564(b)(1) of the Act, 21 U.S.C. section 360bbb-3(b)(1), unless the authorization is  terminated or revoked.  Performed at [facility], [place] [address], [place],  [address]   C Difficile Quick Screen w PCR reflex     Status: None   Collection Time: 01/12/20  6:24 AM  Specimen: STOOL  Result Value Ref Range Status   C Diff antigen NEGATIVE NEGATIVE Final   C Diff toxin NEGATIVE NEGATIVE Final   C Diff interpretation No C. difficile detected.  Final    Comment: Performed at [facility], [place] [address], [place], [place] [address]     Labs: BNP (last 3 results) Recent Labs    01/12/20 0135 01/12/20 0854  BNP 219.9* 425.9*   Basic Metabolic Panel: Recent Labs  Lab 01/11/20 1630 01/12/20 0135 01/12/20 0854  NA 139 136  --   K 2.8* 3.2*  --   CL  --  102  --   CO2  --  20*  --   GLUCOSE  --  123*  --   BUN  --  18  --   CREATININE  --  0.89  --   CALCIUM  --  8.1*  --   MG  --   --  1.0*   Liver Function Tests: Recent Labs  Lab 01/12/20 0135  AST 20  ALT 7  ALKPHOS 80  BILITOT 0.8  PROT 7.0  ALBUMIN 2.4*   No results for input(s): LIPASE, AMYLASE in the last 168 hours. No results for input(s): AMMONIA in the last 168 hours. CBC: Recent Labs  Lab 01/11/20 1617 01/11/20 1630 01/12/20 0854  WBC 8.9  --  8.8  NEUTROABS 7.3  --   --   HGB 10.1* 10.2* 9.7*  HCT 32.2* 30.0* 30.9*  MCV 97.3  --  97.2  PLT 272  --  249   Cardiac Enzymes: No results for input(s): CKTOTAL, CKMB, CKMBINDEX, TROPONINI in the last 168 hours. BNP: Invalid input(s): POCBNP CBG: Recent Labs  Lab 01/12/20 1549 01/12/20 2010 01/12/20 2248 01/13/20 0752 01/13/20 1133  GLUCAP 130* 135* 152* 128* 141*   D-Dimer No results for input(s): DDIMER in the last 72 hours. Hgb A1c No results for input(s): HGBA1C in the last 72 hours. Lipid Profile No results for input(s): CHOL, HDL, LDLCALC, TRIG, CHOLHDL, LDLDIRECT in the last 72 hours. Thyroid function studies No results for input(s): TSH, T4TOTAL, T3FREE, THYROIDAB in the last 72  hours.  Invalid input(s): FREET3 Anemia work up No results for input(s): VITAMINB12, FOLATE, FERRITIN, TIBC, IRON, RETICCTPCT in the last 72 hours. Urinalysis    Component Value Date/Time   COLORURINE YELLOW 01/11/2020 2121   APPEARANCEUR CLOUDY (A) 01/11/2020 2121   LABSPEC 1.010 01/11/2020 2121   PHURINE 6.0 01/11/2020 2121   GLUCOSEU NEGATIVE 01/11/2020 2121   HGBUR MODERATE (A) 01/11/2020 2121   BILIRUBINUR NEGATIVE 01/11/2020 2121   [place] NEGATIVE 01/11/2020 2121   PROTEINUR 100 (A) 01/11/2020 2121   UROBILINOGEN 0.2 02/21/2013 2012   NITRITE NEGATIVE 01/11/2020 2121   LEUKOCYTESUR LARGE (A) 01/11/2020 2121   Sepsis Labs Invalid input(s): PROCALCITONIN,  WBC,  LACTICIDVEN Microbiology Recent Results (from the past 240 hour(s))  Culture, blood (Routine x 2)     Status: None (Preliminary result)   Collection Time: 01/11/20  4:17 PM   Specimen: BLOOD LEFT ARM  Result Value Ref Range Status   Specimen Description BLOOD LEFT ARM  Final   Special Requests   Final    BOTTLES DRAWN AEROBIC AND ANAEROBIC Blood Culture adequate volume   Culture   Final    NO GROWTH 2 DAYS Performed at [facility], [place] [address] [place], [place] [address]    Report Status PENDING  Incomplete  Urine culture     Status: None (  Preliminary result)   Collection Time: 01/11/20  5:06 PM   Specimen: In/Out Cath Urine  Result Value Ref Range Status   Specimen Description IN/OUT CATH URINE  Final   Special Requests NONE  Final   Culture   Final    CULTURE REINCUBATED FOR BETTER GROWTH Performed at [facility], [place] [address] [employer] [place], [place] [address]    Report Status PENDING  Incomplete  Resp Panel by RT-PCR (Flu A&B, Covid) Nasopharyngeal Swab     Status: Abnormal   Collection Time: 01/11/20  6:06 PM   Specimen: Nasopharyngeal Swab; Nasopharyngeal(NP) swabs in vial transport medium  Result Value Ref Range Status   SARS Coronavirus 2 by RT PCR POSITIVE (A) NEGATIVE Final     Comment: RESULT CALLED TO, READ BACK BY AND VERIFIED WITHThana Ates RN 1939 01/11/20 A BROWNING (NOTE) SARS-CoV-2 target nucleic acids are DETECTED.  The SARS-CoV-2 RNA is generally detectable in upper respiratory specimens during the acute phase of infection. Positive results are indicative of the presence of the identified virus, but do not rule out bacterial infection or co-infection with other pathogens not detected by the test. Clinical correlation with patient history and other diagnostic information is necessary to determine patient infection status. The expected result is Negative.  Fact Sheet for Patients: EntrepreneurPulse.com.au  Fact Sheet for Healthcare Providers: IncredibleEmployment.be  This test is not yet approved or cleared by the [place] FDA and  has been authorized for detection and/or diagnosis of SARS-CoV-2 by FDA under an Emergency Use Authorization (EUA).  This EUA will remain in effect (meaning this test ca n be used) for the duration of  the COVID-19 declaration under Section 564(b)(1) of the Act, 21 U.S.C. section 360bbb-3(b)(1), unless the authorization is terminated or revoked sooner.     Influenza A by PCR NEGATIVE NEGATIVE Final   Influenza B by PCR NEGATIVE NEGATIVE Final    Comment: (NOTE) The Xpert Xpress SARS-CoV-2/FLU/RSV plus assay is intended as an aid in the diagnosis of influenza from Nasopharyngeal swab specimens and should not be used as a sole basis for treatment. Nasal washings and aspirates are unacceptable for Xpert Xpress SARS-CoV-2/FLU/RSV testing.  Fact Sheet for Patients: EntrepreneurPulse.com.au  Fact Sheet for Healthcare Providers: IncredibleEmployment.be  This test is not yet approved or cleared by the [place] FDA and has been authorized for detection and/or diagnosis of SARS-CoV-2 by FDA under an Emergency Use Authorization (EUA). This  EUA will remain in effect (meaning this test can be used) for the duration of the COVID-19 declaration under Section 564(b)(1) of the Act, 21 U.S.C. section 360bbb-3(b)(1), unless the authorization is terminated or revoked.  Performed at [facility], [place] [address], [place], [place] [address]   C Difficile Quick Screen w PCR reflex     Status: None   Collection Time: 01/12/20  6:24 AM   Specimen: STOOL  Result Value Ref Range Status   C Diff antigen NEGATIVE NEGATIVE Final   C Diff toxin NEGATIVE NEGATIVE Final   C Diff interpretation No C. difficile detected.  Final    Comment: Performed at [facility], [place] [address], [place], [place] [address]     Time coordinating discharge: Over 30 minutes  SIGNED:   Darliss Cheney, MD  [facility] 01/13/2020, 12:02 PM  If 7PM-7AM, please contact night-coverage [email or web address]

## 2020-01-13 NOTE — TOC Transition Note (Signed)
Transition of Care [facility]) - CM/SW Discharge Note   Patient Details  Name: Algenis Ballin MRN: [id number] Date of Birth: August 04, 1945  [facility] CM/SW Contact:  Carles Collet, RN Phone Number: 01/13/2020, 2:26 PM   Clinical Narrative:   Damaris Schooner w daughter [name] re disposition. Patient will return home, she verified address. Requested PTAR. PTAR forms taken to unit. PTAR called, it will be a several hour wait. Tarria aware of wait.           Patient Goals and CMS Choice        Discharge Placement                       Discharge Plan and Services                                     Social Determinants of Health (SDOH) Interventions     Readmission Risk Interventions No flowsheet data found.

## 2020-01-13 NOTE — Progress Notes (Signed)
Night lab tech and day lab tech unable to draw blood d/t no access points. Lab techs also stated that patient c/o of pain when touched. Will pass info to day RN.

## 2020-01-13 NOTE — ED Notes (Signed)
This RN attempted to give report on this pt. [phone] Nurse said she would call me back in 10 minutes.

## 2020-01-13 NOTE — Discharge Instructions (Signed)

## 2020-01-13 NOTE — Plan of Care (Signed)
?  Problem: Clinical Measurements: ?Goal: Ability to avoid or minimize complications of infection will improve ?Outcome: Progressing ?  ?Problem: Skin Integrity: ?Goal: Skin integrity will improve ?Outcome: Progressing ?  ?

## 2020-01-15 LAB — URINE CULTURE: Culture: >=100000 — AB

## 2020-01-16 LAB — BLOOD CULTURE ID PANEL (REFLEXED) - BCID2

## 2020-01-18 LAB — CULTURE, BLOOD (ROUTINE X 2): Special Requests: ADEQUATE

## 2020-01-19 NOTE — Procedures (Signed)
Notified by lab of positive blood cultures, 1 out of 2 sets, growing coag negative Staphylococcus,  Staphylococcus hominis, is most likely contamination, I have called daughter and discussed with her on 01/19/2020 at 2:55 pm , patient is doing well, no fever, improving on Augmentin and doxycycline, he has a follow-up with dermatology soon, and with his PCP next week, discussed with her these findings, and these positive cultures due to contamination, and nothing to be done, as well she denies him having any dysuria, or polyuria, so no indication to change antibiotics in the setting of his Pseudomonas urine culture. Phillips Climes MD

## 2020-02-19 ENCOUNTER — Encounter (HOSPITAL_BASED_OUTPATIENT_CLINIC_OR_DEPARTMENT_OTHER): Payer: PRIVATE HEALTH INSURANCE | Admitting: Internal Medicine

## 2020-02-25 ENCOUNTER — Encounter (HOSPITAL_BASED_OUTPATIENT_CLINIC_OR_DEPARTMENT_OTHER): Payer: Medicare Other | Attending: Internal Medicine | Admitting: Internal Medicine

## 2020-02-25 ENCOUNTER — Other Ambulatory Visit: Payer: Self-pay

## 2020-02-25 DIAGNOSIS — L89322 Pressure ulcer of left buttock, stage 2: Secondary | ICD-10-CM | POA: Insufficient documentation

## 2020-02-25 DIAGNOSIS — E11622 Type 2 diabetes mellitus with other skin ulcer: Secondary | ICD-10-CM | POA: Diagnosis present

## 2020-02-25 DIAGNOSIS — G8220 Paraplegia, unspecified: Secondary | ICD-10-CM | POA: Diagnosis not present

## 2020-02-25 DIAGNOSIS — L97821 Non-pressure chronic ulcer of other part of left lower leg limited to breakdown of skin: Secondary | ICD-10-CM | POA: Insufficient documentation

## 2020-02-25 DIAGNOSIS — L97811 Non-pressure chronic ulcer of other part of right lower leg limited to breakdown of skin: Secondary | ICD-10-CM | POA: Insufficient documentation

## 2020-02-25 DIAGNOSIS — Z8616 Personal history of COVID-19: Secondary | ICD-10-CM | POA: Diagnosis not present

## 2020-02-25 DIAGNOSIS — E1151 Type 2 diabetes mellitus with diabetic peripheral angiopathy without gangrene: Secondary | ICD-10-CM | POA: Diagnosis not present

## 2020-02-25 DIAGNOSIS — L03113 Cellulitis of right upper limb: Secondary | ICD-10-CM | POA: Diagnosis not present

## 2020-02-25 DIAGNOSIS — L97213 Non-pressure chronic ulcer of right calf with necrosis of muscle: Secondary | ICD-10-CM | POA: Insufficient documentation

## 2020-02-25 DIAGNOSIS — Z8673 Personal history of transient ischemic attack (TIA), and cerebral infarction without residual deficits: Secondary | ICD-10-CM | POA: Diagnosis not present

## 2020-02-25 NOTE — Progress Notes (Signed)
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 raised areas of granulation tissue coming out of the surface of his right  greater than left knee.so many of the firm blisters that I am used to seeing. He also has areas on toes on the left. 2. We put silver alginate on the wounded areas and gauze. As noted when we are seeing this man we are unaware of dermatology review last week. 3. He is already on doxycycline ordered by Dr. Lorain Childes however I have already put him on a prolonged course of doxycycline which really does not have much effect on this. I was hoping that there would be additional biopsy information from the consult at [facility] 4. He is apparently going to [facility] every week therefore we will put his admission here on hold until we hear back from his daughter of the patient that they need to be seen here. 5. I did a culture of pus in the webspace of his thumb and first finger on the right but I did not prescribe additional antibiotic Electronic Signature(s) Signed: 02/25/2020 5:13:40 PM By: Linton Ham MD Signed: 02/25/2020 6:08:01 PM By: Deon Pilling Entered By: Deon Pilling on 02/25/2020 13:53:55 -------------------------------------------------------------------------------- SuperBill Details Patient Name: Date of Service: [name], [name] 02/25/2020 Medical Record Number: [id number] Patient Account Number: [id number] Date of Birth/Sex: Treating RN: 12-21-1945 (75 y.o. 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 S61.401D Unspecified open wound of right hand, subsequent encounter L89.322 Pressure ulcer of left buttock, stage 2 L97.821 Non-pressure chronic ulcer of other part of left lower leg limited to breakdown of skin Facility Procedures CPT4  Code: [id number] Description: [phone] - WOUND CARE VISIT-LEV 5 EST PT Modifier: Quantity: 1 Physician Procedures : CPT4 Code Description Modifier [id number] - WC PHYS LEVEL 3 - EST PT ICD-10 Diagnosis Description S61.401D Unspecified open wound of right hand, subsequent encounter L97.811 Non-pressure chronic ulcer of other part of right lower leg limited to  breakdown of skin L97.821 Non-pressure chronic ulcer of other part of left lower leg limited to breakdown of skin Quantity: 1 Electronic Signature(s) Signed: 02/25/2020 5:13:40 PM By: Linton Ham MD Signed: 02/25/2020 6:08:01 PM By: Deon Pilling Entered By: Deon Pilling on 02/25/2020 13:52:38

## 2020-02-26 ENCOUNTER — Encounter (HOSPITAL_BASED_OUTPATIENT_CLINIC_OR_DEPARTMENT_OTHER): Payer: PRIVATE HEALTH INSURANCE | Admitting: Internal Medicine

## 2020-02-26 ENCOUNTER — Other Ambulatory Visit (HOSPITAL_COMMUNITY)
Admit: 2020-02-26 | Discharge: 2020-02-26 | Disposition: A | Discharge status: Home / Self Care | Payer: Medicare Other | Source: Ambulatory Visit | Attending: Internal Medicine | Admitting: Internal Medicine

## 2020-02-26 DIAGNOSIS — X58XXXA Exposure to other specified factors, initial encounter: Secondary | ICD-10-CM | POA: Diagnosis not present

## 2020-02-26 DIAGNOSIS — S61401A Unspecified open wound of right hand, initial encounter: Secondary | ICD-10-CM | POA: Diagnosis present

## 2020-02-26 NOTE — Progress Notes (Signed)
[name] [name], [name] [name] ([id number]) Visit Report for 02/25/2020 Arrival Information Details Patient Name: Date of Service: [name] [name], [name] RD R. 02/25/2020 9:45 A M Medical Record Number: [id number] Patient Account Number: [id number] Date of Birth/Sex: Treating RN: 1945-02-17 (75 y.o. [name] [name] Primary Care [name] [name]: Theodora Blow, MIKE Other Clinician: Referring [name] [name]: Treating [name] [name]/Extender: Joanne Chars, MIKE Weeks in Treatment: 16 Visit Information History Since Last Visit Added or deleted any medications: No Patient Arrived: Stretcher Any new allergies or adverse reactions: No Arrival Time: 09:49 Had a fall or experienced change in No Accompanied By: self activities of daily living that may affect Transfer Assistance: None risk of falls: Patient Identification Verified: Yes Signs or symptoms of abuse/neglect since last visito No Secondary Verification Process Completed: Yes Hospitalized since last visit: No Patient Requires Transmission-Based Precautions: No Implantable device outside of the clinic excluding No Patient Has Alerts: Yes cellular tissue based products placed in the center Patient Alerts: R ABI non compressible since last visit: Has Dressing in Place as Prescribed: Yes Pain Present Now: No Electronic Signature(s) Signed: 02/26/2020 1:25:35 PM By: Sandre Kitty Entered By: Sandre Kitty on 02/25/2020 09:49:46 -------------------------------------------------------------------------------- Clinic Level of Care Assessment Details Patient Name: Date of Service: [name] [name], [name] RD R. 02/25/2020 9:45 A M Medical Record Number: [id number] Patient Account Number: [id number] Date of Birth/Sex: Treating RN: [birth year] (75 y.o. [name] [name], [name] Primary Care [name] [name]: Theodora Blow, MIKE Other Clinician: Referring [name] [name]: Treating [name] [name]/Extender: Joanne Chars, MIKE Weeks in Treatment: 112 Clinic Level of Care Assessment Items TOOL 4 Quantity  Score X- 1 0 Use when only an EandM is performed on FOLLOW-UP visit ASSESSMENTS - Nursing Assessment / Reassessment X- 1 10 Reassessment of Co-morbidities (includes updates in patient status) X- 1 5 Reassessment of Adherence to Treatment Plan ASSESSMENTS - Wound and Skin A ssessment / Reassessment '[]'  - 0 Simple Wound Assessment / Reassessment - one wound X- 8 5 Complex Wound Assessment / Reassessment - multiple wounds X- 1 10 Dermatologic / Skin Assessment (not related to wound area) ASSESSMENTS - Focused Assessment X- 2 5 Circumferential Edema Measurements - multi extremities X- 1 10 Nutritional Assessment / Counseling / Intervention '[]'  - 0 Lower Extremity Assessment (monofilament, tuning fork, pulses) '[]'  - 0 Peripheral Arterial Disease Assessment (using hand held doppler) ASSESSMENTS - Ostomy and/or Continence Assessment and Care '[]'  - 0 Incontinence Assessment and Management '[]'  - 0 Ostomy Care Assessment and Management (repouching, etc.) PROCESS - Coordination of Care '[]'  - 0 Simple Patient / Family Education for ongoing care X- 1 20 Complex (extensive) Patient / Family Education for ongoing care X- 1 10 Staff obtains Programmer, systems, Records, T Results / Process Orders est '[]'  - 0 Staff telephones HHA, Nursing Homes / Clarify orders / etc '[]'  - 0 Routine Transfer to another Facility (non-emergent condition) '[]'  - 0 Routine Hospital Admission (non-emergent condition) '[]'  - 0 New Admissions / Biomedical engineer / Ordering NPWT Apligraf, etc. , '[]'  - 0 Emergency Hospital Admission (emergent condition) '[]'  - 0 Simple Discharge Coordination X- 1 15 Complex (extensive) Discharge Coordination PROCESS - Special Needs '[]'  - 0 Pediatric / Minor Patient Management '[]'  - 0 Isolation Patient Management '[]'  - 0 Hearing / Language / Visual special needs '[]'  - 0 Assessment of Community assistance (transportation, D/C planning, etc.) '[]'  - 0 Additional assistance / Altered  mentation '[]'  - 0 Support Surface(s) Assessment (bed, cushion, seat, etc.) INTERVENTIONS - Wound Cleansing / Measurement '[]'  - 0 Simple Wound Cleansing - one wound X- 8  5 Complex Wound Cleansing - multiple wounds X- 1 5 Wound Imaging (photographs - any number of wounds) '[]'  - 0 Wound Tracing (instead of photographs) '[]'  - 0 Simple Wound Measurement - one wound X- 8 5 Complex Wound Measurement - multiple wounds INTERVENTIONS - Wound Dressings '[]'  - 0 Small Wound Dressing one or multiple wounds X- 1 15 Medium Wound Dressing one or multiple wounds X- 2 20 Large Wound Dressing one or multiple wounds '[]'  - 0 Application of Medications - topical '[]'  - 0 Application of Medications - injection INTERVENTIONS - Miscellaneous '[]'  - 0 External ear exam '[]'  - 0 Specimen Collection (cultures, biopsies, blood, body fluids, etc.) '[]'  - 0 Specimen(s) / Culture(s) sent or taken to Lab for analysis '[]'  - 0 Patient Transfer (multiple staff / Civil Service fast streamer / Similar devices) '[]'  - 0 Simple Staple / Suture removal (25 or less) '[]'  - 0 Complex Staple / Suture removal (26 or more) '[]'  - 0 Hypo / Hyperglycemic Management (close monitor of Blood Glucose) '[]'  - 0 Ankle / Brachial Index (ABI) - do not check if billed separately X- 1 5 Vital Signs Has the patient been seen at the hospital within the last three years: Yes Total Score: 275 Level Of Care: New/Established - Level 5 Electronic Signature(s) Signed: 02/25/2020 6:08:01 PM By: Deon Pilling Entered By: Deon Pilling on 02/25/2020 13:52:32 -------------------------------------------------------------------------------- Encounter Discharge Information Details Patient Name: Date of Service: [name] [name], [name] RD R. 02/25/2020 9:45 A M Medical Record Number: [id number] Patient Account Number: [id number] Date of Birth/Sex: Treating RN: 01/27/1945 (75 y.o. [name] [name] Primary Care Maximino Cozzolino: Theodora Blow, MIKE Other Clinician: Referring Hari Casaus: Treating  [name] [name]/Extender: Joanne Chars, MIKE Weeks in Treatment: 112 Encounter Discharge Information Items Discharge Condition: Stable Ambulatory Status: Stretcher Discharge Destination: Home Transportation: Ambulance Accompanied By: EMS Schedule Follow-up Appointment: Yes Clinical Summary of Care: Patient Declined Electronic Signature(s) Signed: 02/25/2020 5:55:42 PM By: Baruch Gouty RN, BSN Entered By: Baruch Gouty on 02/25/2020 16:16:06 -------------------------------------------------------------------------------- Lower Extremity Assessment Details Patient Name: Date of Service: [name] [name], [name] RD R. 02/25/2020 9:45 A M Medical Record Number: [id number] Patient Account Number: [id number] Date of Birth/Sex: Treating RN: Jan 03, 1946 (75 y.o. [name] Primary Care Astou Lada: Theodora Blow, MIKE Other Clinician: Referring Tenise Stetler: Treating Railynn Ballo/Extender: Joanne Chars, MIKE Weeks in Treatment: 112 Edema Assessment Assessed: [Left: No] [Right: No] Edema: [Left: Yes] [Right: Yes] Calf Left: Right: Point of Measurement: From Medial Instep 40 cm 41 cm Ankle Left: Right: Point of Measurement: From Medial Instep 19 cm 19 cm Vascular Assessment Pulses: Dorsalis Pedis Palpable: [Left:Yes] [Right:Yes] Electronic Signature(s) Signed: 02/25/2020 5:58:12 PM By: Levan Hurst RN, BSN Entered By: Levan Hurst on 02/25/2020 10:49:48 -------------------------------------------------------------------------------- Multi Wound Chart Details Patient Name: Date of Service: [name] [name], [name] RD R. 02/25/2020 9:45 A M Medical Record Number: [id number] Patient Account Number: [id number] Date of Birth/Sex: Treating RN: [birth year] (75 y.o. [name] [name] Primary Care Milissa Fesperman: Theodora Blow, MIKE Other Clinician: Referring Melisa Donofrio: Treating Rylee Huestis/Extender: Joanne Chars, MIKE Weeks in Treatment: 112 Vital Signs Height(in): 67 Pulse(bpm): 70 Weight(lbs):  130 Blood Pressure(mmHg): 158/84 Body Mass Index(BMI): 20 Temperature(F): 98.6 Respiratory Rate(breaths/min): 18 Photos: [1:No Photos Right Hand - Dorsum] [16:No Photos Right, Proximal, Anterior Lower Leg] [19:No Photos Left T Fourth oe] Wound Location: [1:Gradually Appeared] [16:Gradually Appeared] [19:Gradually Appeared] Wounding Event: [1:Atypical] [16:Atypical] [19:Diabetic Wound/Ulcer of the Lower] Primary Etiology: [1:Anemia, Hypertension, Type II] [16:Anemia, Hypertension, Type II] [19:Extremity Anemia, Hypertension, Type II] Comorbid History: [1:Diabetes, Paraplegia 11/15/2017] [16:Diabetes, Paraplegia 07/10/2019] [  19:Diabetes, Paraplegia 12/25/2019] Date Acquired: [1:112] [16:32] [19:8] Weeks of Treatment: [1:Open] [16:Healed - Epithelialized] [19:Open] Wound Status: [1:Yes] [16:No] [19:No] Clustered Wound: [1:4] [16:N/A] [19:N/A] Clustered Quantity: [1:15x28.5x0.1] [16:0x0x0] [19:1x1.1x0.2] Measurements L x W x D (cm) [1:335.758] [16:0] [19:0.864] A (cm) : rea [1:33.576] [16:0] [19:0.173] Volume (cm) : [1:-953.00%] [16:100.00%] [19:51.10%] % Reduction in Area: [1:-952.90%] [16:100.00%] [19:2.30%] % Reduction in Volume: [1:Full Thickness Without Exposed] [16:Full Thickness Without Exposed] [19:Grade 2] Classification: [1:Support Structures Medium] [16:Support Structures None Present] [19:Medium] Exudate A mount: [1:Purulent] [16:N/A] [19:Serosanguineous] Exudate Type: [1:yellow, brown, green] [16:N/A] [19:red, brown] Exudate Color: [1:Yes] [16:No] [19:No] Foul Odor A Cleansing: [1:fter No] [16:N/A] [19:N/A] Odor Anticipated Due to Product Use: [1:Indistinct, nonvisible] [16:Indistinct, nonvisible] [19:Flat and Intact] Wound Margin: [1:Medium (34-66%)] [16:None Present (0%)] [19:Large (67-100%)] Granulation A mount: [1:Pink] [16:N/A] [19:Pink] Granulation Quality: [1:Medium (34-66%)] [16:None Present (0%)] [19:Small (1-33%)] Necrotic Amount: [1:Eschar, Adherent Slough]  [16:N/A] [19:Adherent Slough] Necrotic Tissue: [1:Fat Layer (Subcutaneous Tissue): Yes Fascia: No] [19:Fat Layer (Subcutaneous Tissue): Yes] Exposed Structures: [1:Fascia: No Tendon: No Muscle: No Joint: No Bone: No Small (1-33%)] [16:Fat Layer (Subcutaneous Tissue): No Tendon: No Muscle: No Joint: No Bone: No Large (67-100%)] [19:Fascia: No Tendon: No Muscle: No Joint: No Bone: No None] Wound Number: '[id number] ' Photos: No Photos No Photos No Photos Left T Third oe Left, Lateral Lower Leg Right Knee Wound Location: Gradually Appeared Gradually Appeared Gradually Appeared Wounding Event: 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 Primary Etiology: Extremity Extremity Extremity Anemia, Hypertension, Type II Anemia, Hypertension, Type II Anemia, Hypertension, Type II Comorbid History: Diabetes, Paraplegia Diabetes, Paraplegia Diabetes, Paraplegia 12/25/2019 02/25/2020 02/25/2020 Date Acquired: 8 0 0 Weeks of Treatment: Open Open Open Wound Status: No No No Clustered Wound: N/A N/A N/A Clustered Quantity: 0.6x1.2x0.1 3.8x2.1x0.1 5.5x12x0.1 Measurements L x W x D (cm) 0.565 6.267 51.836 A (cm) : rea 0.057 0.627 5.184 Volume (cm) : -79.90% 0.00% 0.00% % Reduction in A rea: -83.90% 0.00% 0.00% % Reduction in Volume: Grade 2 Grade 2 Grade 2 Classification: Medium Medium Medium Exudate A mount: Serosanguineous Serosanguineous Serous Exudate Type: red, brown red, brown amber Exudate Color: No No No Foul Odor A Cleansing: fter N/A N/A N/A Odor A nticipated Due to Product Use: Flat and Intact Flat and Intact Flat and Intact Wound Margin: Medium (34-66%) Large (67-100%) Large (67-100%) Granulation A mount: Pink Pink Pink, Hyper-granulation Granulation Quality: Medium (34-66%) None Present (0%) None Present (0%) Necrotic A mount: Adherent Slough N/A N/A Necrotic Tissue: Fat Layer (Subcutaneous Tissue): Yes Fat  Layer (Subcutaneous Tissue): Yes Fascia: No Exposed Structures: Fascia: No Fascia: No Fat Layer (Subcutaneous Tissue): No Tendon: No Tendon: No Tendon: No Muscle: No Muscle: No Muscle: No Joint: No Joint: No Joint: No Bone: No Bone: No Bone: No None None None Epithelialization: Wound Number: '[id number] ' Photos: No Photos No Photos No Photos Right, Anterior Lower Leg Right, Lateral, Posterior Lower Leg Left, Anterior Lower Leg Wound Location: Gradually Appeared Gradually Appeared Gradually Appeared Wounding Event: 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 Primary Etiology: Extremity Extremity Extremity Anemia, Hypertension, Type II Anemia, Hypertension, Type II Anemia, Hypertension, Type II Comorbid History: Diabetes, Paraplegia Diabetes, Paraplegia Diabetes, Paraplegia 11/15/2017 11/15/2017 11/15/2017 Date Acquired: 112 112 112 Weeks of Treatment: Open Open Open Wound Status: Yes Yes Yes Clustered Wound: '1 1 9 ' Clustered Quantity: 0.3x0.4x0.1 0.7x1x0.1 15x6x0.1 Measurements L x W x D (cm) 0.094 0.55 70.686 A (  cm) : rea 0.009 0.055 7.069 Volume (cm) : 99.90% 98.00% -246.70% % Reduction in A rea: 99.90% 98.00% -246.70% % Reduction in Volume: Grade 2 Grade 2 Grade 2 Classification: Medium Medium Medium Exudate A mount: Serosanguineous Serosanguineous Serosanguineous Exudate Type: red, brown red, brown red, brown Exudate Color: No No No Foul Odor A Cleansing: fter N/A N/A N/A Odor A nticipated Due to Product Use: Flat and Intact Flat and Intact Flat and Intact Wound Margin: Large (67-100%) Medium (34-66%) Large (67-100%) Granulation A mount: Pink Red Red Granulation Quality: None Present (0%) Medium (34-66%) Small (1-33%) Necrotic A mount: N/A Adherent Slough Adherent Slough Necrotic Tissue: Fat Layer (Subcutaneous Tissue): Yes Fat Layer (Subcutaneous Tissue): Yes Fat Layer (Subcutaneous  Tissue): Yes Exposed Structures: Fascia: No Fascia: No Fascia: No Tendon: No Tendon: No Tendon: No Muscle: No Muscle: No Muscle: No Joint: No Joint: No Joint: No Bone: No Bone: No Bone: No Small (1-33%) Medium (34-66%) Large (67-100%) Epithelialization: Treatment Notes Electronic Signature(s) Signed: 02/25/2020 5:13:40 PM By: Linton Ham MD Signed: 02/25/2020 6:08:01 PM By: Deon Pilling Entered By: Linton Ham on 02/25/2020 12:45:10 -------------------------------------------------------------------------------- Multi-Disciplinary Care Plan Details Patient Name: Date of Service: [name] [name], [name] RD R. 02/25/2020 9:45 A M Medical Record Number: [id number] Patient Account Number: [id number] Date of Birth/Sex: Treating RN: [birth year] (75 y.o. [name] [name] Primary Care Lashai Grosch: Theodora Blow, MIKE Other Clinician: Referring Maahir Horst: Treating Fedrick Cefalu/Extender: Joanne Chars, MIKE Weeks in Treatment: 112 Active Inactive Wound/Skin Impairment Nursing Diagnoses: Knowledge deficit related to ulceration/compromised skin integrity Goals: Patient/caregiver will verbalize understanding of skin care regimen Date Initiated: 05/20/2019 Target Resolution Date: 04/15/2020 Goal Status: Active Ulcer/skin breakdown will have a volume reduction of 30% by week 4 Date Initiated: 12/31/2017 Date Inactivated: 05/20/2019 Target Resolution Date: 06/18/2018 Goal Status: Met Interventions: Assess patient/caregiver ability to obtain necessary supplies Assess patient/caregiver ability to perform ulcer/skin care regimen upon admission and as needed Assess ulceration(s) every visit Notes: Electronic Signature(s) Signed: 02/25/2020 6:08:01 PM By: Deon Pilling Entered By: Deon Pilling on 02/25/2020 10:02:30 -------------------------------------------------------------------------------- Pain Assessment Details Patient Name: Date of Service: [name] [name], [name] RD R. 02/25/2020 9:45 A  M Medical Record Number: [id number] Patient Account Number: [id number] Date of Birth/Sex: Treating RN: August 14, 1945 (75 y.o. [name] [name] Primary Care Clinten Howk: Theodora Blow, MIKE Other Clinician: Referring Sirr Kabel: Treating Taysia Rivere/Extender: Joanne Chars, MIKE Weeks in Treatment: 112 Active Problems Location of Pain Severity and Description of Pain Patient Has Paino No Site Locations Pain Management and Medication Current Pain Management: Electronic Signature(s) Signed: 02/25/2020 6:08:01 PM By: Deon Pilling Signed: 02/26/2020 1:25:35 PM By: Sandre Kitty Entered By: Sandre Kitty on 02/25/2020 09:50:10 -------------------------------------------------------------------------------- Patient/Caregiver Education Details Patient Name: Date of Service: [name] Schultze RD R. 2/10/2022andnbsp9:45 A M Medical Record Number: [id number] Patient Account Number: [id number] Date of Birth/Gender: Treating RN: 11-04-1945 (75 y.o. [name] [name] Primary Care Physician: Theodora Blow, MIKE Other Clinician: Referring Physician: Treating Physician/Extender: Joanne Chars, MIKE Weeks in Treatment: 112 Education Assessment Education Provided To: Patient and Caregiver Education Topics Provided Wound/Skin Impairment: Handouts: Skin Care Do's and Dont's Methods: Explain/Verbal Responses: Reinforcements needed Electronic Signature(s) Signed: 02/25/2020 6:08:01 PM By: Deon Pilling Entered By: Deon Pilling on 02/25/2020 10:02:43 -------------------------------------------------------------------------------- Wound Assessment Details Patient Name: Date of Service: [name] [name], [name] RD R. 02/25/2020 9:45 A M Medical Record Number: [id number] Patient Account Number: [id number] Date of Birth/Sex: Treating RN: 05-13-1945 (75 y.o. [name] [name], [name] Primary Care Nihira Puello: Theodora Blow, MIKE Other Clinician: Referring Cledith Abdou: Treating King Pinzon/Extender: Joanne Chars,  MIKE Weeks in Treatment: 112 Wound Status Wound Number: 1 Primary Etiology: Atypical Wound Location: Right Hand - Dorsum Wound Status: Open Wounding Event: Gradually Appeared Comorbid History: Anemia, Hypertension, Type II Diabetes, Paraplegia Date Acquired: 11/15/2017 Weeks Of Treatment: 112 Clustered Wound: Yes Wound Measurements Length: (cm) 15 Width: (cm) 28.5 Depth: (cm) 0.1 Clustered Quantity: 4 Area: (cm) 335.758 Volume: (cm) 33.576 % Reduction in Area: -953% % Reduction in Volume: -952.9% Epithelialization: Small (1-33%) Tunneling: No Undermining: No Wound Description Classification: Full Thickness Without Exposed Support Structures Wound Margin: Indistinct, nonvisible Exudate Amount: Medium Exudate Type: Purulent Exudate Color: yellow, brown, green Foul Odor After Cleansing: Yes Due to Product Use: No Slough/Fibrino Yes Wound Bed Granulation Amount: Medium (34-66%) Exposed Structure Granulation Quality: Pink Fascia Exposed: No Necrotic Amount: Medium (34-66%) Fat Layer (Subcutaneous Tissue) Exposed: Yes Necrotic Quality: Eschar, Adherent Slough Tendon Exposed: No Muscle Exposed: No Joint Exposed: No Bone Exposed: No Treatment Notes Wound #1 (Hand - Dorsum) Wound Laterality: Right Cleanser Wound Cleanser Discharge Instruction: Cleanse the wound with wound cleanser prior to applying a clean dressing using gauze sponges, not tissue or cotton balls. Peri-Wound Care Sween Lotion (Moisturizing lotion) Discharge Instruction: Apply moisturizing lotion to both legs Topical Primary Dressing KerraCel Ag Gelling Fiber Dressing, 4x5 in (silver alginate) Discharge Instruction: Apply silver alginate to wound bed as instructed Secondary Dressing Woven Gauze Sponge, Non-Sterile 4x4 in Discharge Instruction: Apply over primary dressing as directed. ABD Pad, 5x9 Discharge Instruction: Apply over primary dressing as directed. Secured With [employer],  4.5x3.1 (in/yd) Discharge Instruction: Secure with Kerlix as directed. Compression Wrap Compression Stockings Add-Ons Electronic Signature(s) Signed: 02/25/2020 5:58:12 PM By: Levan Hurst RN, BSN Signed: 02/25/2020 6:08:01 PM By: Deon Pilling Entered By: Levan Hurst on 02/25/2020 10:50:27 -------------------------------------------------------------------------------- Wound Assessment Details Patient Name: Date of Service: [name] [name], [name] RD R. 02/25/2020 9:45 A M Medical Record Number: [id number] Patient Account Number: [id number] Date of Birth/Sex: Treating RN: 1945/11/08 (75 y.o. [name] [name], [name] Primary Care Aydan Phoenix: Theodora Blow, MIKE Other Clinician: Referring Virna Livengood: Treating Selicia Windom/Extender: Joanne Chars, MIKE Weeks in Treatment: 112 Wound Status Wound Number: 16 Primary Etiology: Atypical Wound Location: Right, Proximal, Anterior Lower Leg Wound Status: Healed - Epithelialized Wounding Event: Gradually Appeared Comorbid History: Anemia, Hypertension, Type II Diabetes, Paraplegia Date Acquired: 07/10/2019 Weeks Of Treatment: 32 Clustered Wound: No Wound Measurements Length: (cm) Width: (cm) Depth: (cm) Area: (cm) Volume: (cm) 0 % Reduction in Area: 100% 0 % Reduction in Volume: 100% 0 Epithelialization: Large (67-100%) 0 Tunneling: No 0 Undermining: No Wound Description Classification: Full Thickness Without Exposed Support Structures Wound Margin: Indistinct, nonvisible Exudate Amount: None Present Foul Odor After Cleansing: No Slough/Fibrino No Wound Bed Granulation Amount: None Present (0%) Exposed Structure Necrotic Amount: None Present (0%) Fascia Exposed: No Fat Layer (Subcutaneous Tissue) Exposed: No Tendon Exposed: No Muscle Exposed: No Joint Exposed: No Bone Exposed: No Electronic Signature(s) Signed: 02/25/2020 5:58:12 PM By: Levan Hurst RN, BSN Signed: 02/25/2020 6:08:01 PM By: Deon Pilling Entered By: Levan Hurst on  02/25/2020 10:50:46 -------------------------------------------------------------------------------- Wound Assessment Details Patient Name: Date of Service: [name] [name], [name] RD R. 02/25/2020 9:45 A M Medical Record Number: [id number] Patient Account Number: [id number] Date of Birth/Sex: Treating RN: 04/12/1945 (75 y.o. [name] [name] Primary Care Geral Tuch: Theodora Blow, MIKE Other Clinician: Referring Nethan Caudillo: Treating Araceli Coufal/Extender: Joanne Chars, MIKE Weeks in Treatment: 112 Wound Status Wound Number: 19 Primary Etiology: Diabetic Wound/Ulcer of the Lower Extremity Wound Location: Left T Fourth oe Wound Status: Open Wounding Event:  Gradually Appeared Comorbid History: Anemia, Hypertension, Type II Diabetes, Paraplegia Date Acquired: 12/25/2019 Weeks Of Treatment: 8 Clustered Wound: No Wound Measurements Length: (cm) 1 Width: (cm) 1.1 Depth: (cm) 0.2 Area: (cm) 0.864 Volume: (cm) 0.173 % Reduction in Area: 51.1% % Reduction in Volume: 2.3% Epithelialization: None Tunneling: No Undermining: No Wound Description Classification: Grade 2 Wound Margin: Flat and Intact Exudate Amount: Medium Exudate Type: Serosanguineous Exudate Color: red, brown Foul Odor After Cleansing: No Slough/Fibrino No Wound Bed Granulation Amount: Large (67-100%) Exposed Structure Granulation Quality: Pink Fascia Exposed: No Necrotic Amount: Small (1-33%) Fat Layer (Subcutaneous Tissue) Exposed: Yes Necrotic Quality: Adherent Slough Tendon Exposed: No Muscle Exposed: No Joint Exposed: No Bone Exposed: No Treatment Notes Wound #19 (Toe Fourth) Wound Laterality: Left Cleanser Wound Cleanser Discharge Instruction: Cleanse the wound with wound cleanser prior to applying a clean dressing using gauze sponges, not tissue or cotton balls. Peri-Wound Care Sween Lotion (Moisturizing lotion) Discharge Instruction: Apply moisturizing lotion to both legs Topical Primary  Dressing KerraCel Ag Gelling Fiber Dressing, 4x5 in (silver alginate) Discharge Instruction: Apply silver alginate to wound bed as instructed Secondary Dressing Woven Gauze Sponge, Non-Sterile 4x4 in Discharge Instruction: Apply over primary dressing as directed. ABD Pad, 5x9 Discharge Instruction: Apply over primary dressing as directed. Secured With [employer], 4.5x3.1 (in/yd) Discharge Instruction: Secure with Kerlix as directed. Compression Wrap Compression Stockings Add-Ons Electronic Signature(s) Signed: 02/25/2020 5:58:12 PM By: Levan Hurst RN, BSN Signed: 02/25/2020 6:08:01 PM By: Deon Pilling Entered By: Levan Hurst on 02/25/2020 10:51:05 -------------------------------------------------------------------------------- Wound Assessment Details Patient Name: Date of Service: [name] [name], [name] RD R. 02/25/2020 9:45 A M Medical Record Number: [id number] Patient Account Number: [id number] Date of Birth/Sex: Treating RN: 1945-07-24 (75 y.o. [name] [name], [name] Lallie Strahm: Theodora Blow, MIKE Other Clinician: Referring Granger Chui: Treating Sudeep Scheibel/Extender: Joanne Chars, MIKE Weeks in Treatment: 112 Wound Status Wound Number: 20 Primary Etiology: Diabetic Wound/Ulcer of the Lower Extremity Wound Location: Left T Third oe Wound Status: Open Wounding Event: Gradually Appeared Comorbid History: Anemia, Hypertension, Type II Diabetes, Paraplegia Date Acquired: 12/25/2019 Weeks Of Treatment: 8 Clustered Wound: No Wound Measurements Length: (cm) 0.6 Width: (cm) 1.2 Depth: (cm) 0.1 Area: (cm) 0.565 Volume: (cm) 0.057 % Reduction in Area: -79.9% % Reduction in Volume: -83.9% Epithelialization: None Tunneling: No Undermining: No Wound Description Classification: Grade 2 Wound Margin: Flat and Intact Exudate Amount: Medium Exudate Type: Serosanguineous Exudate Color: red, brown Foul Odor After Cleansing: No Slough/Fibrino Yes Wound  Bed Granulation Amount: Medium (34-66%) Exposed Structure Granulation Quality: Pink Fascia Exposed: No Necrotic Amount: Medium (34-66%) Fat Layer (Subcutaneous Tissue) Exposed: Yes Necrotic Quality: Adherent Slough Tendon Exposed: No Muscle Exposed: No Joint Exposed: No Bone Exposed: No Treatment Notes Wound #20 (Toe Third) Wound Laterality: Left Cleanser Wound Cleanser Discharge Instruction: Cleanse the wound with wound cleanser prior to applying a clean dressing using gauze sponges, not tissue or cotton balls. Peri-Wound Care Sween Lotion (Moisturizing lotion) Discharge Instruction: Apply moisturizing lotion to both legs Topical Primary Dressing KerraCel Ag Gelling Fiber Dressing, 4x5 in (silver alginate) Discharge Instruction: Apply silver alginate to wound bed as instructed Secondary Dressing Woven Gauze Sponge, Non-Sterile 4x4 in Discharge Instruction: Apply over primary dressing as directed. ABD Pad, 5x9 Discharge Instruction: Apply over primary dressing as directed. Secured With [employer], 4.5x3.1 (in/yd) Discharge Instruction: Secure with Kerlix as directed. Compression Wrap Compression Stockings Add-Ons Electronic Signature(s) Signed: 02/25/2020 5:58:12 PM By: Levan Hurst RN, BSN Signed: 02/25/2020 6:08:01 PM By: Deon Pilling Entered By:  Levan Hurst on 02/25/2020 10:51:30 -------------------------------------------------------------------------------- Wound Assessment Details Patient Name: Date of Service: [name] [name], [name] RD R. 02/25/2020 9:45 A M Medical Record Number: [id number] Patient Account Number: [id number] Date of Birth/Sex: Treating RN: 10/05/1945 (75 y.o. [name] [name], [name] Adaley Kiene: Theodora Blow, MIKE Other Clinician: Referring Derrich Gaby: Treating Dryden Tapley/Extender: Joanne Chars, MIKE Weeks in Treatment: 112 Wound Status Wound Number: 21 Primary Etiology: Diabetic Wound/Ulcer of the Lower Extremity Wound Location:  Left, Lateral Lower Leg Wound Status: Open Wounding Event: Gradually Appeared Comorbid History: Anemia, Hypertension, Type II Diabetes, Paraplegia Date Acquired: 02/25/2020 Weeks Of Treatment: 0 Clustered Wound: No Wound Measurements Length: (cm) 3.8 Width: (cm) 2.1 Depth: (cm) 0.1 Area: (cm) 6.267 Volume: (cm) 0.627 % Reduction in Area: 0% % Reduction in Volume: 0% Epithelialization: None Tunneling: No Undermining: No Wound Description Classification: Grade 2 Wound Margin: Flat and Intact Exudate Amount: Medium Exudate Type: Serosanguineous Exudate Color: red, brown Foul Odor After Cleansing: No Slough/Fibrino No Wound Bed Granulation Amount: Large (67-100%) Exposed Structure Granulation Quality: Pink Fascia Exposed: No Necrotic Amount: None Present (0%) Fat Layer (Subcutaneous Tissue) Exposed: Yes Tendon Exposed: No Muscle Exposed: No Joint Exposed: No Bone Exposed: No Treatment Notes Wound #21 (Lower Leg) Wound Laterality: Left, Lateral Cleanser Peri-Wound Care Topical Primary Dressing Secondary Dressing Secured With Compression Wrap Compression Stockings Add-Ons Electronic Signature(s) Signed: 02/25/2020 5:58:12 PM By: Levan Hurst RN, BSN Signed: 02/25/2020 6:08:01 PM By: Deon Pilling Entered By: Levan Hurst on 02/25/2020 10:54:42 -------------------------------------------------------------------------------- Wound Assessment Details Patient Name: Date of Service: [name] [name], [name] RD R. 02/25/2020 9:45 A M Medical Record Number: [id number] Patient Account Number: [id number] Date of Birth/Sex: Treating RN: [birth year] (75 y.o. [name] [name], [name] Kane Kusek: Theodora Blow, MIKE Other Clinician: Referring Luisana Lutzke: Treating Roth Ress/Extender: Joanne Chars, MIKE Weeks in Treatment: 112 Wound Status Wound Number: 22 Primary Etiology: Diabetic Wound/Ulcer of the Lower Extremity Wound Location: Right Knee Wound Status: Open Wounding  Event: Gradually Appeared Comorbid History: Anemia, Hypertension, Type II Diabetes, Paraplegia Date Acquired: 02/25/2020 Weeks Of Treatment: 0 Clustered Wound: No Wound Measurements Length: (cm) 5.5 Width: (cm) 12 Depth: (cm) 0.1 Area: (cm) 51.836 Volume: (cm) 5.184 % Reduction in Area: 0% % Reduction in Volume: 0% Epithelialization: None Tunneling: No Undermining: No Wound Description Classification: Grade 2 Wound Margin: Flat and Intact Exudate Amount: Medium Exudate Type: Serous Exudate Color: amber Foul Odor After Cleansing: No Slough/Fibrino No Wound Bed Granulation Amount: Large (67-100%) Exposed Structure Granulation Quality: Pink, Hyper-granulation Fascia Exposed: No Necrotic Amount: None Present (0%) Fat Layer (Subcutaneous Tissue) Exposed: No Tendon Exposed: No Muscle Exposed: No Joint Exposed: No Bone Exposed: No Treatment Notes Wound #22 (Knee) Wound Laterality: Right Cleanser Peri-Wound Care Topical Primary Dressing Secondary Dressing Secured With Compression Wrap Compression Stockings Add-Ons Electronic Signature(s) Signed: 02/25/2020 5:58:12 PM By: Levan Hurst RN, BSN Signed: 02/25/2020 6:08:01 PM By: Deon Pilling Entered By: Levan Hurst on 02/25/2020 10:57:56 -------------------------------------------------------------------------------- Wound Assessment Details Patient Name: Date of Service: [name] [name], [name] RD R. 02/25/2020 9:45 A M Medical Record Number: [id number] Patient Account Number: [id number] Date of Birth/Sex: Treating RN: May 05, 1945 (75 y.o. [name] [name] Primary Care Mickala Laton: Theodora Blow, MIKE Other Clinician: Referring Rosenda Geffrard: Treating Annaly Skop/Extender: Joanne Chars, MIKE Weeks in Treatment: 112 Wound Status Wound Number: 5 Primary Etiology: Diabetic Wound/Ulcer of the Lower Extremity Wound Location: Right, Anterior Lower Leg Wound Status: Open Wounding Event: Gradually Appeared Comorbid History:  Anemia, Hypertension, Type II Diabetes, Paraplegia Date Acquired: 11/15/2017 Weeks Of Treatment: 112 Clustered Wound:  Yes Wound Measurements Length: (cm) 0.3 Width: (cm) 0.4 Depth: (cm) 0.1 Clustered Quantity: 1 Area: (cm) 0.094 Volume: (cm) 0.009 % Reduction in Area: 99.9% % Reduction in Volume: 99.9% Epithelialization: Small (1-33%) Tunneling: No Undermining: No Wound Description Classification: Grade 2 Wound Margin: Flat and Intact Exudate Amount: Medium Exudate Type: Serosanguineous Exudate Color: red, brown Foul Odor After Cleansing: No Slough/Fibrino No Wound Bed Granulation Amount: Large (67-100%) Exposed Structure Granulation Quality: Pink Fascia Exposed: No Necrotic Amount: None Present (0%) Fat Layer (Subcutaneous Tissue) Exposed: Yes Tendon Exposed: No Muscle Exposed: No Joint Exposed: No Bone Exposed: No Treatment Notes Wound #5 (Lower Leg) Wound Laterality: Right, Anterior Cleanser Wound Cleanser Discharge Instruction: Cleanse the wound with wound cleanser prior to applying a clean dressing using gauze sponges, not tissue or cotton balls. Peri-Wound Care Sween Lotion (Moisturizing lotion) Discharge Instruction: Apply moisturizing lotion to both legs Topical Primary Dressing KerraCel Ag Gelling Fiber Dressing, 4x5 in (silver alginate) Discharge Instruction: Apply silver alginate to wound bed as instructed Secondary Dressing Woven Gauze Sponge, Non-Sterile 4x4 in Discharge Instruction: Apply over primary dressing as directed. ABD Pad, 5x9 Discharge Instruction: Apply over primary dressing as directed. Secured With [employer], 4.5x3.1 (in/yd) Discharge Instruction: Secure with Kerlix as directed. Compression Wrap Compression Stockings Add-Ons Electronic Signature(s) Signed: 02/25/2020 5:58:12 PM By: Levan Hurst RN, BSN Signed: 02/25/2020 6:08:01 PM By: Deon Pilling Entered By: Levan Hurst on 02/25/2020  10:59:44 -------------------------------------------------------------------------------- Wound Assessment Details Patient Name: Date of Service: [name] [name], [name] RD R. 02/25/2020 9:45 A M Medical Record Number: [id number] Patient Account Number: [id number] Date of Birth/Sex: Treating RN: Dec 26, 1945 (75 y.o. [name] [name], [name] Menucha Dicesare: Theodora Blow, MIKE Other Clinician: Referring Margery Szostak: Treating Jakobi Thetford/Extender: Joanne Chars, MIKE Weeks in Treatment: 112 Wound Status Wound Number: 6 Primary Etiology: Diabetic Wound/Ulcer of the Lower Extremity Wound Location: Right, Lateral, Posterior Lower Leg Wound Status: Open Wounding Event: Gradually Appeared Comorbid History: Anemia, Hypertension, Type II Diabetes, Paraplegia Date Acquired: 11/15/2017 Weeks Of Treatment: 112 Clustered Wound: Yes Wound Measurements Length: (cm) 0.7 Width: (cm) 1 Depth: (cm) 0.1 Clustered Quantity: 1 Area: (cm) 0.55 Volume: (cm) 0.055 % Reduction in Area: 98% % Reduction in Volume: 98% Epithelialization: Medium (34-66%) Tunneling: No Undermining: No Wound Description Classification: Grade 2 Wound Margin: Flat and Intact Exudate Amount: Medium Exudate Type: Serosanguineous Exudate Color: red, brown Foul Odor After Cleansing: No Slough/Fibrino Yes Wound Bed Granulation Amount: Medium (34-66%) Exposed Structure Granulation Quality: Red Fascia Exposed: No Necrotic Amount: Medium (34-66%) Fat Layer (Subcutaneous Tissue) Exposed: Yes Necrotic Quality: Adherent Slough Tendon Exposed: No Muscle Exposed: No Joint Exposed: No Bone Exposed: No Treatment Notes Wound #6 (Lower Leg) Wound Laterality: Right, Lateral, Posterior Cleanser Wound Cleanser Discharge Instruction: Cleanse the wound with wound cleanser prior to applying a clean dressing using gauze sponges, not tissue or cotton balls. Peri-Wound Care Sween Lotion (Moisturizing lotion) Discharge Instruction: Apply  moisturizing lotion to both legs Topical Primary Dressing KerraCel Ag Gelling Fiber Dressing, 4x5 in (silver alginate) Discharge Instruction: Apply silver alginate to wound bed as instructed Secondary Dressing Woven Gauze Sponge, Non-Sterile 4x4 in Discharge Instruction: Apply over primary dressing as directed. ABD Pad, 5x9 Discharge Instruction: Apply over primary dressing as directed. Secured With [employer], 4.5x3.1 (in/yd) Discharge Instruction: Secure with Kerlix as directed. Compression Wrap Compression Stockings Add-Ons Electronic Signature(s) Signed: 02/25/2020 5:58:12 PM By: Levan Hurst RN, BSN Signed: 02/25/2020 6:08:01 PM By: Deon Pilling Entered By: Levan Hurst on 02/25/2020 11:08:49 -------------------------------------------------------------------------------- Wound Assessment Details Patient  Name: Date of Service: [name] [name], [name] RD R. 02/25/2020 9:45 A M Medical Record Number: [id number] Patient Account Number: [id number] Date of Birth/Sex: Treating RN: September 16, 1945 (75 y.o. [name] [name], [name] Dajai Wahlert: Theodora Blow, MIKE Other Clinician: Referring Princetta Uplinger: Treating Chas Axel/Extender: Joanne Chars, MIKE Weeks in Treatment: 112 Wound Status Wound Number: 8 Primary Etiology: Diabetic Wound/Ulcer of the Lower Extremity Wound Location: Left, Anterior Lower Leg Wound Status: Open Wounding Event: Gradually Appeared Comorbid History: Anemia, Hypertension, Type II Diabetes, Paraplegia Date Acquired: 11/15/2017 Weeks Of Treatment: 112 Clustered Wound: Yes Wound Measurements Length: (cm) 15 Width: (cm) 6 Depth: (cm) 0.1 Clustered Quantity: 9 Area: (cm) 70.686 Volume: (cm) 7.069 % Reduction in Area: -246.7% % Reduction in Volume: -246.7% Epithelialization: Large (67-100%) Tunneling: No Undermining: No Wound Description Classification: Grade 2 Wound Margin: Flat and Intact Exudate Amount: Medium Exudate Type:  Serosanguineous Exudate Color: red, brown Foul Odor After Cleansing: No Slough/Fibrino Yes Wound Bed Granulation Amount: Large (67-100%) Exposed Structure Granulation Quality: Red Fascia Exposed: No Necrotic Amount: Small (1-33%) Fat Layer (Subcutaneous Tissue) Exposed: Yes Necrotic Quality: Adherent Slough Tendon Exposed: No Muscle Exposed: No Joint Exposed: No Bone Exposed: No Treatment Notes Wound #8 (Lower Leg) Wound Laterality: Left, Anterior Cleanser Wound Cleanser Discharge Instruction: Cleanse the wound with wound cleanser prior to applying a clean dressing using gauze sponges, not tissue or cotton balls. Peri-Wound Care Sween Lotion (Moisturizing lotion) Discharge Instruction: Apply moisturizing lotion to both legs Topical Primary Dressing KerraCel Ag Gelling Fiber Dressing, 4x5 in (silver alginate) Discharge Instruction: Apply silver alginate to wound bed as instructed Secondary Dressing Woven Gauze Sponge, Non-Sterile 4x4 in Discharge Instruction: Apply over primary dressing as directed. ABD Pad, 5x9 Discharge Instruction: Apply over primary dressing as directed. Secured With Compression Wrap Compression Stockings Environmental education officer) Signed: 02/25/2020 5:58:12 PM By: Levan Hurst RN, BSN Signed: 02/25/2020 6:08:01 PM By: Deon Pilling Entered By: Levan Hurst on 02/25/2020 11:10:58 -------------------------------------------------------------------------------- Vitals Details Patient Name: Date of Service: [name] [name], [name] RD R. 02/25/2020 9:45 A M Medical Record Number: [id number] Patient Account Number: [id number] Date of Birth/Sex: Treating RN: 06/23/1945 (75 y.o. [name] [name], [name] Kwamaine Cuppett: Theodora Blow, MIKE Other Clinician: Referring Lindy Pennisi: Treating Claudy Abdallah/Extender: Joanne Chars, MIKE Weeks in Treatment: 112 Vital Signs Time Taken: 09:49 Temperature (F): 98.6 Height (in): 67 Pulse (bpm): 66 Weight (lbs):  130 Respiratory Rate (breaths/min): 18 Body Mass Index (BMI): 20.4 Blood Pressure (mmHg): 158/84 Reference Range: 80 - 120 mg / dl Electronic Signature(s) Signed: 02/26/2020 1:25:35 PM By: Sandre Kitty Entered By: Sandre Kitty on 02/25/2020 09:50:04

## 2020-02-28 LAB — AEROBIC CULTURE W GRAM STAIN (SUPERFICIAL SPECIMEN)

## 2020-03-17 ENCOUNTER — Encounter (HOSPITAL_BASED_OUTPATIENT_CLINIC_OR_DEPARTMENT_OTHER): Payer: Medicare Other | Admitting: Internal Medicine

## 2020-03-31 ENCOUNTER — Encounter (HOSPITAL_BASED_OUTPATIENT_CLINIC_OR_DEPARTMENT_OTHER): Payer: Medicare Other | Attending: Internal Medicine | Admitting: Internal Medicine

## 2020-03-31 ENCOUNTER — Other Ambulatory Visit: Payer: Self-pay

## 2020-03-31 DIAGNOSIS — I890 Lymphedema, not elsewhere classified: Secondary | ICD-10-CM | POA: Insufficient documentation

## 2020-03-31 DIAGNOSIS — I10 Essential (primary) hypertension: Secondary | ICD-10-CM | POA: Insufficient documentation

## 2020-03-31 DIAGNOSIS — L97811 Non-pressure chronic ulcer of other part of right lower leg limited to breakdown of skin: Secondary | ICD-10-CM | POA: Insufficient documentation

## 2020-03-31 DIAGNOSIS — L89322 Pressure ulcer of left buttock, stage 2: Secondary | ICD-10-CM | POA: Diagnosis not present

## 2020-03-31 DIAGNOSIS — L03113 Cellulitis of right upper limb: Secondary | ICD-10-CM | POA: Diagnosis not present

## 2020-03-31 DIAGNOSIS — Z8616 Personal history of COVID-19: Secondary | ICD-10-CM | POA: Insufficient documentation

## 2020-03-31 DIAGNOSIS — E11622 Type 2 diabetes mellitus with other skin ulcer: Secondary | ICD-10-CM | POA: Diagnosis not present

## 2020-03-31 DIAGNOSIS — G8220 Paraplegia, unspecified: Secondary | ICD-10-CM | POA: Insufficient documentation

## 2020-03-31 DIAGNOSIS — L97821 Non-pressure chronic ulcer of other part of left lower leg limited to breakdown of skin: Secondary | ICD-10-CM | POA: Diagnosis not present

## 2020-03-31 DIAGNOSIS — I872 Venous insufficiency (chronic) (peripheral): Secondary | ICD-10-CM | POA: Diagnosis not present

## 2020-03-31 DIAGNOSIS — E1151 Type 2 diabetes mellitus with diabetic peripheral angiopathy without gangrene: Secondary | ICD-10-CM | POA: Diagnosis present

## 2020-03-31 DIAGNOSIS — L97213 Non-pressure chronic ulcer of right calf with necrosis of muscle: Secondary | ICD-10-CM | POA: Diagnosis not present

## 2020-03-31 NOTE — Progress Notes (Signed)
[name] [name], [name] [name] ([id number]) Visit Report for 03/31/2020 Arrival Information Details Patient Name: Date of Service: [name] [name], [name] RD R. 03/31/2020 10:45 A M Medical Record Number: [id number] Patient Account Number: [id number] Date of Birth/Sex: Treating RN: Dec 30, 1945 (75 y.o. [name] [name] Primary Care Avriel Kandel: Theodora Blow, MIKE Other Clinician: Referring Sydnie Sigmund: Treating Fischer Halley/Extender: Joanne Chars, MIKE Weeks in Treatment: 18 Visit Information History Since Last Visit Added or deleted any medications: Yes Patient Arrived: Stretcher Any new allergies or adverse reactions: No Arrival Time: 10:55 Had a fall or experienced change in No Accompanied By: EMS transport activities of daily living that may affect Transfer Assistance: Stretcher risk of falls: Patient Identification Verified: Yes Signs or symptoms of abuse/neglect since last visito No Secondary Verification Process Completed: Yes Hospitalized since last visit: No Patient Requires Transmission-Based Precautions: No Implantable device outside of the clinic excluding No Patient Has Alerts: Yes cellular tissue based products placed in the center Patient Alerts: R ABI non compressible since last visit: Has Dressing in Place as Prescribed: Yes Pain Present Now: Yes Electronic Signature(s) Signed: 03/31/2020 5:54:24 PM By: Levan Hurst RN, BSN Entered By: Levan Hurst on 03/31/2020 11:18:53 -------------------------------------------------------------------------------- Clinic Level of Care Assessment Details Patient Name: Date of Service: [name] [name], [name] RD R. 03/31/2020 10:45 A M Medical Record Number: [id number] Patient Account Number: [id number] Date of Birth/Sex: Treating RN: 1945/11/18 (75 y.o. [name], [name] Primary Care Benigna Delisi: Theodora Blow, MIKE Other Clinician: Referring Orland Visconti: Treating Zakia Sainato/Extender: Joanne Chars, MIKE Weeks in Treatment: 117 Clinic Level of Care Assessment  Items TOOL 4 Quantity Score X- 1 0 Use when only an EandM is performed on FOLLOW-UP visit ASSESSMENTS - Nursing Assessment / Reassessment X- 1 10 Reassessment of Co-morbidities (includes updates in patient status) X- 1 5 Reassessment of Adherence to Treatment Plan ASSESSMENTS - Wound and Skin A ssessment / Reassessment '[]'$  - 0 Simple Wound Assessment / Reassessment - one wound X- 8 5 Complex Wound Assessment / Reassessment - multiple wounds X- 1 10 Dermatologic / Skin Assessment (not related to wound area) ASSESSMENTS - Focused Assessment X- 2 5 Circumferential Edema Measurements - multi extremities X- 1 10 Nutritional Assessment / Counseling / Intervention '[]'$  - 0 Lower Extremity Assessment (monofilament, tuning fork, pulses) '[]'$  - 0 Peripheral Arterial Disease Assessment (using hand held doppler) ASSESSMENTS - Ostomy and/or Continence Assessment and Care '[]'$  - 0 Incontinence Assessment and Management '[]'$  - 0 Ostomy Care Assessment and Management (repouching, etc.) PROCESS - Coordination of Care '[]'$  - 0 Simple Patient / Family Education for ongoing care X- 1 20 Complex (extensive) Patient / Family Education for ongoing care X- 1 10 Staff obtains Programmer, systems, Records, T Results / Process Orders est '[]'$  - 0 Staff telephones HHA, Nursing Homes / Clarify orders / etc '[]'$  - 0 Routine Transfer to another Facility (non-emergent condition) '[]'$  - 0 Routine Hospital Admission (non-emergent condition) '[]'$  - 0 New Admissions / Biomedical engineer / Ordering NPWT Apligraf, etc. , '[]'$  - 0 Emergency Hospital Admission (emergent condition) '[]'$  - 0 Simple Discharge Coordination X- 1 15 Complex (extensive) Discharge Coordination PROCESS - Special Needs '[]'$  - 0 Pediatric / Minor Patient Management '[]'$  - 0 Isolation Patient Management '[]'$  - 0 Hearing / Language / Visual special needs '[]'$  - 0 Assessment of Community assistance (transportation, D/C planning, etc.) '[]'$  - 0 Additional  assistance / Altered mentation '[]'$  - 0 Support Surface(s) Assessment (bed, cushion, seat, etc.) INTERVENTIONS - Wound Cleansing / Measurement '[]'$  - 0 Simple Wound Cleansing - one  wound X- 8 5 Complex Wound Cleansing - multiple wounds X- 1 5 Wound Imaging (photographs - any number of wounds) '[]'$  - 0 Wound Tracing (instead of photographs) '[]'$  - 0 Simple Wound Measurement - one wound X- 8 5 Complex Wound Measurement - multiple wounds INTERVENTIONS - Wound Dressings '[]'$  - 0 Small Wound Dressing one or multiple wounds X- 1 15 Medium Wound Dressing one or multiple wounds X- 2 20 Large Wound Dressing one or multiple wounds X- 1 5 Application of Medications - topical '[]'$  - 0 Application of Medications - injection INTERVENTIONS - Miscellaneous '[]'$  - 0 External ear exam '[]'$  - 0 Specimen Collection (cultures, biopsies, blood, body fluids, etc.) '[]'$  - 0 Specimen(s) / Culture(s) sent or taken to Lab for analysis '[]'$  - 0 Patient Transfer (multiple staff / Civil Service fast streamer / Similar devices) '[]'$  - 0 Simple Staple / Suture removal (25 or less) '[]'$  - 0 Complex Staple / Suture removal (26 or more) '[]'$  - 0 Hypo / Hyperglycemic Management (close monitor of Blood Glucose) '[]'$  - 0 Ankle / Brachial Index (ABI) - do not check if billed separately X- 1 5 Vital Signs Has the patient been seen at the hospital within the last three years: Yes Total Score: 280 Level Of Care: New/Established - Level 5 Electronic Signature(s) Signed: 03/31/2020 5:53:43 PM By: Deon Pilling Entered By: Deon Pilling on 03/31/2020 13:05:13 -------------------------------------------------------------------------------- Encounter Discharge Information Details Patient Name: Date of Service: [name] [name], [name] RD R. 03/31/2020 10:45 A M Medical Record Number: [id number] Patient Account Number: [id number] Date of Birth/Sex: Treating RN: August 13, 1945 (75 y.o. [name] Primary Care Tykeshia Tourangeau: Theodora Blow, MIKE Other Clinician: Referring  Prachi Oftedahl: Treating Keion Neels/Extender: Joanne Chars, MIKE Weeks in Treatment: 117 Encounter Discharge Information Items Discharge Condition: Stable Ambulatory Status: Stretcher Discharge Destination: Home Transportation: Ambulance Accompanied By: EMS Schedule Follow-up Appointment: Yes Clinical Summary of Care: Patient Declined Electronic Signature(s) Signed: 03/31/2020 5:44:19 PM By: Baruch Gouty RN, BSN Entered By: Baruch Gouty on 03/31/2020 12:03:49 -------------------------------------------------------------------------------- Lower Extremity Assessment Details Patient Name: Date of Service: [name] [name], [name] RD R. 03/31/2020 10:45 A M Medical Record Number: [id number] Patient Account Number: [id number] Date of Birth/Sex: Treating RN: 30-Dec-1945 (75 y.o. [name] [name] Primary Care Dawna Jakes: Theodora Blow, MIKE Other Clinician: Referring Gradie Ohm: Treating Cheris Tweten/Extender: Joanne Chars, MIKE Weeks in Treatment: 117 Edema Assessment Assessed: [Left: No] [Right: No] Edema: [Left: Yes] [Right: Yes] Calf Left: Right: Point of Measurement: From Medial Instep 40 cm 41 cm Ankle Left: Right: Point of Measurement: From Medial Instep 19 cm 19 cm Vascular Assessment Pulses: Dorsalis Pedis Palpable: [Left:Yes] [Right:Yes] Electronic Signature(s) Signed: 03/31/2020 5:54:24 PM By: Levan Hurst RN, BSN Entered By: Levan Hurst on 03/31/2020 11:20:10 -------------------------------------------------------------------------------- Multi Wound Chart Details Patient Name: Date of Service: [name] [name], [name] RD R. 03/31/2020 10:45 A M Medical Record Number: [id number] Patient Account Number: [id number] Date of Birth/Sex: Treating RN: May 16, 1945 (75 y.o. [name] [name] Primary Care Que Meneely: Theodora Blow, MIKE Other Clinician: Referring Jadene Stemmer: Treating Guinn Delarosa/Extender: Joanne Chars, MIKE Weeks in Treatment: 117 Vital Signs Height(in):  34 Pulse(bpm): 86 Weight(lbs): 130 Blood Pressure(mmHg): 148/82 Body Mass Index(BMI): 20 Temperature(F): 98.4 Respiratory Rate(breaths/min): 18 Photos: [1:No Photos Right Hand - Dorsum] [19:No Photos Left T Fourth oe] [20:No 39 Left T Third oe] Wound Location: [1:Gradually Appeared] [19:Gradually Appeared] [20:Gradually Appeared] Wounding Event: [1:Atypical] [19:Diabetic Wound/Ulcer of the Lower] [20:Diabetic Wound/Ulcer of the Lower] Primary Etiology: [1:Anemia, Hypertension, Type II] [19:Extremity Anemia, Hypertension, Type II] [20:Extremity Anemia, Hypertension, Type II] Comorbid  History: [1:Diabetes, Paraplegia 11/15/2017] [19:Diabetes, Paraplegia 12/25/2019] [20:Diabetes, Paraplegia 12/25/2019] Date Acquired: [1:117] [19:13] [20:13] Weeks of Treatment: [1:Open] [19:Healed - Epithelialized] [20:Open] Wound Status: [1:Yes] [19:No] [20:No] Clustered Wound: [1:4] [19:N/A] [20:N/A] Clustered Quantity: [1:13x5.5x0.1] [19:0x0x0] [20:0.1x0.1x0.1] Measurements L x W x D (cm) [1:56.156] [19:0] [20:0.008] A (cm) : rea [1:5.616] [19:0] [20:0.001] Volume (cm) : [1:-76.10%] [19:100.00%] [20:97.50%] % Reduction in Area: [1:-76.10%] [19:100.00%] [20:96.80%] % Reduction in Volume: [1:Full Thickness Without Exposed] [19:Grade 2] [20:Grade 2] Classification: [1:Support Structures Medium] [19:None Present] [20:Small] Exudate Amount: [1:Serosanguineous] [19:N/A] [20:Serosanguineous] Exudate Type: [1:red, brown] [19:N/A] [20:red, brown] Exudate Color: [1:Indistinct, nonvisible] [19:Flat and Intact] [20:Flat and Intact] Wound Margin: [1:Large (67-100%)] [19:None Present (0%)] [20:Large (67-100%)] Granulation Amount: [1:Pink, Hyper-granulation] [19:N/A] [20:Pink] Granulation Quality: [1:Small (1-33%)] [19:None Present (0%)] [20:None Present (0%)] Necrotic Amount: [1:Fat Layer (Subcutaneous Tissue): Yes Fascia: No] [20:Fat Layer (Subcutaneous Tissue): Yes] Exposed Structures: [1:Fascia: No  Tendon: No Muscle: No Joint: No Bone: No Small (1-33%)] [19:Fat Layer (Subcutaneous Tissue): No Tendon: No Muscle: No Joint: No Bone: No Large (67-100%)] [20:Fascia: No Tendon: No Muscle: No Joint: No Bone: No Large (67-100%)] Wound Number: '[id number] 22 5 '$ Photos: No Photos No Photos No Photos Left, Lateral Lower Leg Right Knee Right, Anterior Lower Leg Wound Location: Gradually Appeared Gradually Appeared Gradually Appeared Wounding Event: 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 Primary Etiology: Extremity Extremity Extremity Anemia, Hypertension, Type II Anemia, Hypertension, Type II Anemia, Hypertension, Type II Comorbid History: Diabetes, Paraplegia Diabetes, Paraplegia Diabetes, Paraplegia 02/25/2020 02/25/2020 11/15/2017 Date Acquired: 5 5 117 Weeks of Treatment: Healed - Epithelialized Healed - Epithelialized Healed - Epithelialized Wound Status: No No Yes Clustered Wound: N/A N/A 1 Clustered Quantity: 0x0x0 0x0x0 0x0x0 Measurements L x W x D (cm) 0 0 0 A (cm) : rea 0 0 0 Volume (cm) : 100.00% 100.00% 100.00% % Reduction in A rea: 100.00% 100.00% 100.00% % Reduction in Volume: Grade 2 Grade 2 Grade 2 Classification: None Present None Present None Present Exudate A mount: N/A N/A N/A Exudate Type: N/A N/A N/A Exudate Color: Flat and Intact Flat and Intact Flat and Intact Wound Margin: None Present (0%) None Present (0%) None Present (0%) Granulation A mount: N/A N/A N/A Granulation Quality: None Present (0%) None Present (0%) None Present (0%) Necrotic A mount: Fascia: No Fascia: No Fascia: No Exposed Structures: Fat Layer (Subcutaneous Tissue): No Fat Layer (Subcutaneous Tissue): No Fat Layer (Subcutaneous Tissue): No Tendon: No Tendon: No Tendon: No Muscle: No Muscle: No Muscle: No Joint: No Joint: No Joint: No Bone: No Bone: No Bone: No Large (67-100%) Large (67-100%) Large  (67-100%) Epithelialization: Wound Number: [id number] N/A Photos: No Photos No Photos N/A Right, Lateral, Posterior Lower Leg Left, Anterior Lower Leg N/A Wound Location: Gradually Appeared Gradually Appeared N/A Wounding Event: 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 N/A Primary Etiology: Extremity Extremity Anemia, Hypertension, Type II Anemia, Hypertension, Type II N/A Comorbid History: Diabetes, Paraplegia Diabetes, Paraplegia 11/15/2017 11/15/2017 N/A Date Acquired: 117 117 N/A Weeks of Treatment: Healed - Epithelialized Open N/A Wound Status: Yes Yes N/A Clustered Wound: 1 9 N/A Clustered Quantity: 0x0x0 0x0x0 N/A Measurements L x W x D (cm) 0 0 N/A A (cm) : rea 0 0 N/A Volume (cm) : 100.00% 100.00% N/A % Reduction in A rea: 100.00% 100.00% N/A % Reduction in Volume: Grade 2 Grade 2 N/A Classification: None Present None Present N/A Exudate A mount: N/A N/A N/A Exudate Type: N/A N/A  N/A Exudate Color: Flat and Intact Flat and Intact N/A Wound Margin: None Present (0%) None Present (0%) N/A Granulation A mount: N/A N/A N/A Granulation Quality: None Present (0%) None Present (0%) N/A Necrotic A mount: Fascia: No Fascia: No N/A Exposed Structures: Fat Layer (Subcutaneous Tissue): No Fat Layer (Subcutaneous Tissue): No Tendon: No Tendon: No Muscle: No Muscle: No Joint: No Joint: No Bone: No Bone: No Large (67-100%) Large (67-100%) N/A Epithelialization: Treatment Notes Wound #1 (Hand - Dorsum) Wound Laterality: Right Cleanser Peri-Wound Care Topical Silvadene Cream Discharge Instruction: Apply over the Triamcinolone cream. Apply thin layer to wound bed only Triamcinolone Discharge Instruction: Apply Triamcinolone as directed Primary Dressing Secondary Dressing Secured With Kerlix Roll Sterile, 4.5x3.1 (in/yd) Discharge Instruction: Secure with Kerlix as directed. Compression Wrap Compression  Stockings Add-Ons Wound #20 (Toe Third) Wound Laterality: Left Cleanser Peri-Wound Care Topical Silvadene Cream Discharge Instruction: Apply over the Triamcinolone cream. Apply thin layer to wound bed only Triamcinolone Discharge Instruction: Apply Triamcinolone as directed Primary Dressing Secondary Dressing Secured With Conforming Stretch Gauze Bandage, Sterile 2x75 (in/in) Discharge Instruction: Secure with stretch gauze as directed. Compression Wrap Compression Stockings Add-Ons Wound #8 (Lower Leg) Wound Laterality: Left, Anterior Cleanser Peri-Wound Care Topical Primary Dressing Secondary Dressing Secured With Compression Wrap Compression Stockings Add-Ons Electronic Signature(s) Signed: 03/31/2020 5:46:09 PM By: Linton Ham MD Signed: 03/31/2020 5:53:43 PM By: Deon Pilling Entered By: Linton Ham on 03/31/2020 13:09:13 -------------------------------------------------------------------------------- Multi-Disciplinary Care Plan Details Patient Name: Date of Service: [name] [name], [name] RD R. 03/31/2020 10:45 A M Medical Record Number: [id number] Patient Account Number: [id number] Date of Birth/Sex: Treating RN: 05-Jun-1945 (75 y.o. [name] [name] Primary Care Tinsley Everman: Theodora Blow, MIKE Other Clinician: Referring Nathaniel Wakeley: Treating Jesusita Jocelyn/Extender: Joanne Chars, MIKE Weeks in Treatment: [place] reviewed with physician Active Inactive Electronic Signature(s) Signed: 03/31/2020 5:53:43 PM By: Deon Pilling Entered By: Deon Pilling on 03/31/2020 13:05:48 -------------------------------------------------------------------------------- Pain Assessment Details Patient Name: Date of Service: [name] [name], [name] RD R. 03/31/2020 10:45 A M Medical Record Number: [id number] Patient Account Number: [id number] Date of Birth/Sex: Treating RN: [birth year] (75 y.o. [name] [name] Primary Care Lucinda Spells: Theodora Blow, MIKE Other  Clinician: Referring Sharunda Salmon: Treating Stephanie Littman/Extender: Joanne Chars, MIKE Weeks in Treatment: 117 Active Problems Location of Pain Severity and Description of Pain Patient Has Paino Yes Site Locations Rate the pain. Current Pain Level: 4 Character of Pain Describe the Pain: Tender Pain Management and Medication Current Pain Management: Medication: No Cold Application: No Rest: No Massage: No Activity: No T.E.N.S.: No Heat Application: No Leg drop or elevation: No Is the Current Pain Management Adequate: Adequate How does your wound impact your activities of daily livingo Sleep: No Bathing: No Appetite: No Relationship With Others: No Bladder Continence: No Emotions: No Bowel Continence: No Work: No Toileting: No Drive: No Dressing: No Hobbies: No Electronic Signature(s) Signed: 03/31/2020 5:54:24 PM By: Levan Hurst RN, BSN Entered By: Levan Hurst on 03/31/2020 11:20:05 -------------------------------------------------------------------------------- Patient/Caregiver Education Details Patient Name: Date of Service: [name] Schultze RD R. 3/17/2022andnbsp10:45 A M Medical Record Number: [id number] Patient Account Number: [id number] Date of Birth/Gender: Treating RN: 04-22-1945 (75 y.o. [name] [name] Primary Care Physician: Theodora Blow, MIKE Other Clinician: Referring Physician: Treating Physician/Extender: Joanne Chars, MIKE Weeks in Treatment: 117 Education Assessment Education Provided To: Patient Education Topics Provided Wound/Skin Impairment: Handouts: Skin Care Do's and Dont's Methods: Explain/Verbal Responses: Reinforcements needed Electronic Signature(s) Signed: 03/31/2020 5:53:43 PM By: Deon Pilling Entered By: Deon Pilling on 03/31/2020 10:35:10 --------------------------------------------------------------------------------  Wound Assessment Details Patient Name: Date of Service: [name] [name], [name] RD R. 03/31/2020  10:45 A M Medical Record Number: [id number] Patient Account Number: [id number] Date of Birth/Sex: Treating RN: [birth year] (75 y.o. [name] [name] Primary Care Dejanee Thibeaux: Theodora Blow, MIKE Other Clinician: Referring Hunner Garcon: Treating Kendre Jacinto/Extender: Joanne Chars, MIKE Weeks in Treatment: 117 Wound Status Wound Number: 1 Primary Etiology: Atypical Wound Location: Right Hand - Dorsum Wound Status: Open Wounding Event: Gradually Appeared Comorbid History: Anemia, Hypertension, Type II Diabetes, Paraplegia Date Acquired: 11/15/2017 Weeks Of Treatment: 117 Clustered Wound: Yes Photos Photo Uploaded By: Sandre Kitty on 03/31/2020 16:56:33 Wound Measurements Length: (cm) 13 Width: (cm) 5.5 Depth: (cm) 0.1 Clustered Quantity: 4 Area: (cm) 56.156 Volume: (cm) 5.616 Wound Description Classification: Full Thickness Without Exposed Support Structu Wound Margin: Indistinct, nonvisible Exudate Amount: Medium Exudate Type: Serosanguineous Exudate Color: red, brown Foul Odor After Cleansing: Slough/Fibrino % Reduction in Area: -76.1% % Reduction in Volume: -76.1% Epithelialization: Small (1-33%) Tunneling: No Undermining: No res No Yes Wound Bed Granulation Amount: Large (67-100%) Exposed Structure Granulation Quality: Pink, Hyper-granulation Fascia Exposed: No Necrotic Amount: Small (1-33%) Fat Layer (Subcutaneous Tissue) Exposed: Yes Necrotic Quality: Adherent Slough Tendon Exposed: No Muscle Exposed: No Joint Exposed: No Bone Exposed: No Electronic Signature(s) Signed: 03/31/2020 5:54:24 PM By: Levan Hurst RN, BSN Entered By: Levan Hurst on 03/31/2020 11:24:31 -------------------------------------------------------------------------------- Wound Assessment Details Patient Name: Date of Service: [name] [name], [name] RD R. 03/31/2020 10:45 A M Medical Record Number: [id number] Patient Account Number: [id number] Date of Birth/Sex: Treating RN: [birth year]  (75 y.o. [name] [name] Primary Care Treacy Holcomb: Theodora Blow, MIKE Other Clinician: Referring Nysa Sarin: Treating Maximilien Hayashi/Extender: Joanne Chars, MIKE Weeks in Treatment: 117 Wound Status Wound Number: 19 Primary Etiology: Diabetic Wound/Ulcer of the Lower Extremity Wound Location: Left T Fourth oe Wound Status: Healed - Epithelialized Wounding Event: Gradually Appeared Comorbid History: Anemia, Hypertension, Type II Diabetes, Paraplegia Date Acquired: 12/25/2019 Weeks Of Treatment: 13 Clustered Wound: No Wound Measurements Length: (cm) Width: (cm) Depth: (cm) Area: (cm) Volume: (cm) 0 % Reduction in Area: 100% 0 % Reduction in Volume: 100% 0 Epithelialization: Large (67-100%) 0 Tunneling: No 0 Undermining: No Wound Description Classification: Grade 2 Wound Margin: Flat and Intact Exudate Amount: None Present Foul Odor After Cleansing: No Slough/Fibrino No Wound Bed Granulation Amount: None Present (0%) Exposed Structure Necrotic Amount: None Present (0%) Fascia Exposed: No Fat Layer (Subcutaneous Tissue) Exposed: No Tendon Exposed: No Muscle Exposed: No Joint Exposed: No Bone Exposed: No Electronic Signature(s) Signed: 03/31/2020 5:54:24 PM By: Levan Hurst RN, BSN Entered By: Levan Hurst on 03/31/2020 11:24:47 -------------------------------------------------------------------------------- Wound Assessment Details Patient Name: Date of Service: [name] [name], [name] RD R. 03/31/2020 10:45 A M Medical Record Number: [id number] Patient Account Number: [id number] Date of Birth/Sex: Treating RN: 12-07-1945 (75 y.o. [name] [name] Primary Care Millie Shorb: Theodora Blow, MIKE Other Clinician: Referring Jammie Troup: Treating Mystic Labo/Extender: Joanne Chars, MIKE Weeks in Treatment: 117 Wound Status Wound Number: 20 Primary Etiology: Diabetic Wound/Ulcer of the Lower Extremity Wound Location: Left T Third oe Wound Status: Open Wounding Event:  Gradually Appeared Comorbid History: Anemia, Hypertension, Type II Diabetes, Paraplegia Date Acquired: 12/25/2019 Weeks Of Treatment: 13 Clustered Wound: No Wound Measurements Length: (cm) 0.1 Width: (cm) 0.1 Depth: (cm) 0.1 Area: (cm) 0.008 Volume: (cm) 0.001 % Reduction in Area: 97.5% % Reduction in Volume: 96.8% Epithelialization: Large (67-100%) Tunneling: No Undermining: No Wound Description Classification: Grade 2 Wound Margin: Flat and Intact Exudate Amount: Small Exudate Type: Serosanguineous Exudate Color: red, brown  Foul Odor After Cleansing: No Slough/Fibrino No Wound Bed Granulation Amount: Large (67-100%) Exposed Structure Granulation Quality: Pink Fascia Exposed: No Necrotic Amount: None Present (0%) Fat Layer (Subcutaneous Tissue) Exposed: Yes Tendon Exposed: No Muscle Exposed: No Joint Exposed: No Bone Exposed: No Electronic Signature(s) Signed: 03/31/2020 5:54:24 PM By: Levan Hurst RN, BSN Entered By: Levan Hurst on 03/31/2020 11:25:03 -------------------------------------------------------------------------------- Wound Assessment Details Patient Name: Date of Service: [name] [name], [name] RD R. 03/31/2020 10:45 A M Medical Record Number: [id number] Patient Account Number: [id number] Date of Birth/Sex: Treating RN: 14-Feb-1945 (75 y.o. [name] [name] Primary Care Talisha Erby: Theodora Blow, MIKE Other Clinician: Referring Lekesha Claw: Treating Veasna Santibanez/Extender: Joanne Chars, MIKE Weeks in Treatment: 117 Wound Status Wound Number: 21 Primary Etiology: Diabetic Wound/Ulcer of the Lower Extremity Wound Location: Left, Lateral Lower Leg Wound Status: Healed - Epithelialized Wounding Event: Gradually Appeared Comorbid History: Anemia, Hypertension, Type II Diabetes, Paraplegia Date Acquired: 02/25/2020 Weeks Of Treatment: 5 Clustered Wound: No Wound Measurements Length: (cm) Width: (cm) Depth: (cm) Area: (cm) Volume: (cm) 0 % Reduction  in Area: 100% 0 % Reduction in Volume: 100% 0 Epithelialization: Large (67-100%) 0 Tunneling: No 0 Undermining: No Wound Description Classification: Grade 2 Wound Margin: Flat and Intact Exudate Amount: None Present Foul Odor After Cleansing: No Slough/Fibrino No Wound Bed Granulation Amount: None Present (0%) Exposed Structure Necrotic Amount: None Present (0%) Fascia Exposed: No Fat Layer (Subcutaneous Tissue) Exposed: No Tendon Exposed: No Muscle Exposed: No Joint Exposed: No Bone Exposed: No Electronic Signature(s) Signed: 03/31/2020 5:54:24 PM By: Levan Hurst RN, BSN Entered By: Levan Hurst on 03/31/2020 11:25:19 -------------------------------------------------------------------------------- Wound Assessment Details Patient Name: Date of Service: [name] [name], [name] RD R. 03/31/2020 10:45 A M Medical Record Number: [id number] Patient Account Number: [id number] Date of Birth/Sex: Treating RN: [birth year] (75 y.o. [name] [name] Primary Care Clint Strupp: Theodora Blow, MIKE Other Clinician: Referring Tanaiya Kolarik: Treating Zarahi Fuerst/Extender: Joanne Chars, MIKE Weeks in Treatment: 117 Wound Status Wound Number: 22 Primary Etiology: Diabetic Wound/Ulcer of the Lower Extremity Wound Location: Right Knee Wound Status: Healed - Epithelialized Wounding Event: Gradually Appeared Comorbid History: Anemia, Hypertension, Type II Diabetes, Paraplegia Date Acquired: 02/25/2020 Weeks Of Treatment: 5 Clustered Wound: No Photos Wound Measurements Length: (cm) Width: (cm) Depth: (cm) Area: (cm) Volume: (cm) Wound Description Classification: Grade 2 Wound Margin: Flat and Intact Exudate Amount: None Present Foul Odor After Cleansing: Slough/Fibrino 0 % Reduction in Area: 100% 0 % Reduction in Volume: 100% 0 Epithelialization: Large (67-100%) 0 Tunneling: No 0 Undermining: No No No Wound Bed Granulation Amount: None Present (0%) Exposed Structure Necrotic Amount:  None Present (0%) Fascia Exposed: No Fat Layer (Subcutaneous Tissue) Exposed: No Tendon Exposed: No Muscle Exposed: No Joint Exposed: No Bone Exposed: No Electronic Signature(s) Signed: 03/31/2020 5:41:28 PM By: Sandre Kitty Signed: 03/31/2020 5:54:24 PM By: Levan Hurst RN, BSN Entered By: Sandre Kitty on 03/31/2020 17:16:33 -------------------------------------------------------------------------------- Wound Assessment Details Patient Name: Date of Service: [name] [name], [name] RD R. 03/31/2020 10:45 A M Medical Record Number: [id number] Patient Account Number: [id number] Date of Birth/Sex: Treating RN: Aug 25, 1945 (75 y.o. [name] [name] Primary Care Zyanne Schumm: Theodora Blow, MIKE Other Clinician: Referring Avyon Herendeen: Treating Merwyn Hodapp/Extender: Joanne Chars, MIKE Weeks in Treatment: 117 Wound Status Wound Number: 5 Primary Etiology: Diabetic Wound/Ulcer of the Lower Extremity Wound Location: Right, Anterior Lower Leg Wound Status: Healed - Epithelialized Wounding Event: Gradually Appeared Comorbid History: Anemia, Hypertension, Type II Diabetes, Paraplegia Date Acquired: 11/15/2017 Weeks Of Treatment: 117 Clustered Wound: Yes Wound Measurements Length: (cm) Width: (  cm) Depth: (cm) Clustered Quantity: Area: (cm) Volume: (cm) 0 % Reduction in Area: 100% 0 % Reduction in Volume: 100% 0 Epithelialization: Large (67-100%) 1 Tunneling: No 0 Undermining: No 0 Wound Description Classification: Grade 2 Wound Margin: Flat and Intact Exudate Amount: None Present Foul Odor After Cleansing: No Slough/Fibrino No Wound Bed Granulation Amount: None Present (0%) Exposed Structure Necrotic Amount: None Present (0%) Fascia Exposed: No Fat Layer (Subcutaneous Tissue) Exposed: No Tendon Exposed: No Muscle Exposed: No Joint Exposed: No Bone Exposed: No Electronic Signature(s) Signed: 03/31/2020 5:54:24 PM By: Levan Hurst RN, BSN Entered By: Levan Hurst on  03/31/2020 11:25:47 -------------------------------------------------------------------------------- Wound Assessment Details Patient Name: Date of Service: [name] [name], [name] RD R. 03/31/2020 10:45 A M Medical Record Number: [id number] Patient Account Number: [id number] Date of Birth/Sex: Treating RN: 02-16-1945 (75 y.o. [name] [name] Primary Care Eoghan Belcher: Theodora Blow, MIKE Other Clinician: Referring Lacreasha Hinds: Treating Jaquari Reckner/Extender: Joanne Chars, MIKE Weeks in Treatment: 117 Wound Status Wound Number: 6 Primary Etiology: Diabetic Wound/Ulcer of the Lower Extremity Wound Location: Right, Lateral, Posterior Lower Leg Wound Status: Healed - Epithelialized Wounding Event: Gradually Appeared Comorbid History: Anemia, Hypertension, Type II Diabetes, Paraplegia Date Acquired: 11/15/2017 Weeks Of Treatment: 117 Clustered Wound: Yes Wound Measurements Length: (cm) Width: (cm) Depth: (cm) Clustered Quantity: Area: (cm) Volume: (cm) 0 % Reduction in Area: 100% 0 % Reduction in Volume: 100% 0 Epithelialization: Large (67-100%) 1 Tunneling: No 0 Undermining: No 0 Wound Description Classification: Grade 2 Wound Margin: Flat and Intact Exudate Amount: None Present Foul Odor After Cleansing: No Slough/Fibrino No Wound Bed Granulation Amount: None Present (0%) Exposed Structure Necrotic Amount: None Present (0%) Fascia Exposed: No Fat Layer (Subcutaneous Tissue) Exposed: No Tendon Exposed: No Muscle Exposed: No Joint Exposed: No Bone Exposed: No Electronic Signature(s) Signed: 03/31/2020 5:54:24 PM By: Levan Hurst RN, BSN Entered By: Levan Hurst on 03/31/2020 11:26:04 -------------------------------------------------------------------------------- Wound Assessment Details Patient Name: Date of Service: [name] [name], [name] RD R. 03/31/2020 10:45 A M Medical Record Number: [id number] Patient Account Number: [id number] Date of Birth/Sex: Treating RN: September 22, 1945 (75  y.o. [name] [name] Primary Care Sotero Brinkmeyer: Theodora Blow, MIKE Other Clinician: Referring Anabella Capshaw: Treating Iziah Cates/Extender: Joanne Chars, MIKE Weeks in Treatment: 117 Wound Status Wound Number: 8 Primary Etiology: Diabetic Wound/Ulcer of the Lower Extremity Wound Location: Left, Anterior Lower Leg Wound Status: Healed - Epithelialized Wounding Event: Gradually Appeared Comorbid History: Anemia, Hypertension, Type II Diabetes, Paraplegia Date Acquired: 11/15/2017 Weeks Of Treatment: 117 Clustered Wound: Yes Photos Wound Measurements Length: (cm) Width: (cm) Depth: (cm) Clustered Quantity: Area: (cm) Volume: (cm) 0 % Reduction in Area: 100% 0 % Reduction in Volume: 100% 0 Epithelialization: Large (67-100%) 9 Tunneling: No 0 Undermining: No 0 Wound Description Classification: Grade 2 Wound Margin: Flat and Intact Exudate Amount: None Present Foul Odor After Cleansing: No Slough/Fibrino No Wound Bed Granulation Amount: None Present (0%) Exposed Structure Necrotic Amount: None Present (0%) Fascia Exposed: No Fat Layer (Subcutaneous Tissue) Exposed: No Tendon Exposed: No Muscle Exposed: No Joint Exposed: No Bone Exposed: No Treatment Notes Wound #8 (Lower Leg) Wound Laterality: Left, Anterior Cleanser Peri-Wound Care Topical Primary Dressing Secondary Dressing Secured With Compression Wrap Compression Stockings Add-Ons Electronic Signature(s) Signed: 03/31/2020 5:41:28 PM By: Sandre Kitty Signed: 03/31/2020 5:54:24 PM By: Levan Hurst RN, BSN Entered By: Sandre Kitty on 03/31/2020 17:16:03 -------------------------------------------------------------------------------- Vitals Details Patient Name: Date of Service: [name] [name], [name] RD R. 03/31/2020 10:45 A M Medical Record Number: [id number] Patient Account Number: [id number] Date of Birth/Sex: Treating RN: 1945/09/21 (  75 y.o. [name] [name] Primary Care Saxon Barich: Theodora Blow, MIKE Other  Clinician: Referring Crystalynn Mcinerney: Treating Leba Tibbitts/Extender: Joanne Chars, MIKE Weeks in Treatment: 117 Vital Signs Time Taken: 10:55 Temperature (F): 98.4 Height (in): 67 Pulse (bpm): 77 Weight (lbs): 130 Respiratory Rate (breaths/min): 18 Body Mass Index (BMI): 20.4 Blood Pressure (mmHg): 148/82 Reference Range: 80 - 120 mg / dl Electronic Signature(s) Signed: 03/31/2020 5:54:24 PM By: Levan Hurst RN, BSN Entered By: Levan Hurst on 03/31/2020 11:19:26
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
## 2020-05-26 ENCOUNTER — Other Ambulatory Visit (HOSPITAL_COMMUNITY)
Admission: RE | Admit: 2020-05-26 | Discharge: 2020-05-26 | Disposition: A | Discharge status: Home / Self Care | Payer: Medicare Other | Attending: Internal Medicine | Admitting: Internal Medicine

## 2020-05-26 ENCOUNTER — Other Ambulatory Visit: Payer: Self-pay

## 2020-05-26 ENCOUNTER — Encounter (HOSPITAL_BASED_OUTPATIENT_CLINIC_OR_DEPARTMENT_OTHER): Payer: Medicare Other | Attending: Internal Medicine | Admitting: Internal Medicine

## 2020-05-26 DIAGNOSIS — E1151 Type 2 diabetes mellitus with diabetic peripheral angiopathy without gangrene: Secondary | ICD-10-CM | POA: Diagnosis not present

## 2020-05-26 DIAGNOSIS — L89016 Pressure-induced deep tissue damage of right elbow: Secondary | ICD-10-CM | POA: Insufficient documentation

## 2020-05-26 DIAGNOSIS — L97221 Non-pressure chronic ulcer of left calf limited to breakdown of skin: Secondary | ICD-10-CM | POA: Diagnosis not present

## 2020-05-26 DIAGNOSIS — L97213 Non-pressure chronic ulcer of right calf with necrosis of muscle: Secondary | ICD-10-CM | POA: Diagnosis not present

## 2020-05-26 DIAGNOSIS — L97811 Non-pressure chronic ulcer of other part of right lower leg limited to breakdown of skin: Secondary | ICD-10-CM | POA: Insufficient documentation

## 2020-05-26 DIAGNOSIS — L97821 Non-pressure chronic ulcer of other part of left lower leg limited to breakdown of skin: Secondary | ICD-10-CM | POA: Diagnosis not present

## 2020-05-26 DIAGNOSIS — I10 Essential (primary) hypertension: Secondary | ICD-10-CM | POA: Insufficient documentation

## 2020-05-26 DIAGNOSIS — L89322 Pressure ulcer of left buttock, stage 2: Secondary | ICD-10-CM | POA: Insufficient documentation

## 2020-05-26 DIAGNOSIS — Z8673 Personal history of transient ischemic attack (TIA), and cerebral infarction without residual deficits: Secondary | ICD-10-CM | POA: Insufficient documentation

## 2020-05-26 DIAGNOSIS — I872 Venous insufficiency (chronic) (peripheral): Secondary | ICD-10-CM | POA: Insufficient documentation

## 2020-05-26 DIAGNOSIS — L03113 Cellulitis of right upper limb: Secondary | ICD-10-CM | POA: Diagnosis not present

## 2020-05-26 DIAGNOSIS — E11622 Type 2 diabetes mellitus with other skin ulcer: Secondary | ICD-10-CM | POA: Diagnosis not present

## 2020-05-26 DIAGNOSIS — Z8616 Personal history of COVID-19: Secondary | ICD-10-CM | POA: Diagnosis not present

## 2020-05-26 DIAGNOSIS — S61401D Unspecified open wound of right hand, subsequent encounter: Secondary | ICD-10-CM | POA: Insufficient documentation

## 2020-05-26 DIAGNOSIS — G8250 Quadriplegia, unspecified: Secondary | ICD-10-CM | POA: Insufficient documentation

## 2020-05-26 LAB — GLUCOSE, CAPILLARY: Glucose-Capillary: 112 mg/dL — ABNORMAL HIGH (ref 70–99)

## 2020-05-30 LAB — AEROBIC CULTURE W GRAM STAIN (SUPERFICIAL SPECIMEN)
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
## 2020-06-01 NOTE — Progress Notes (Signed)
SRIHAAN, BEEVERS ([id number]) Visit Report for 05/26/2020 Arrival Information Details Patient Name: Date of Service: [place], [place] RD R. 05/26/2020 10:45 A M Medical Record Number: [id number] Patient Account Number: [id number] Date of Birth/Sex: Treating RN: [birth year] (75 y.o. [name] Primary Care Tia Hieronymus: Theodora Blow, MIKE Other Clinician: Referring Ranson Belluomini: Treating Colbi Staubs/Extender: Joanne Chars, MIKE Weeks in Treatment: 125 Visit Information History Since Last Visit Added or deleted any medications: No Patient Arrived: Stretcher Any new allergies or adverse reactions: No Arrival Time: 11:05 Had a fall or experienced change in No Accompanied By: EMT activities of daily living that may affect Transfer Assistance: Stretcher risk of falls: Patient Identification Verified: Yes Signs or symptoms of abuse/neglect since last visito No Secondary Verification Process Completed: Yes Hospitalized since last visit: No Patient Requires Transmission-Based Precautions: No Implantable device outside of the clinic excluding No Patient Has Alerts: Yes cellular tissue based products placed in the center Patient Alerts: R ABI non compressible since last visit: Has Dressing in Place as Prescribed: Yes Pain Present Now: Yes Electronic Signature(s) Signed: 06/01/2020 6:30:22 PM By: Levan Hurst RN, BSN Entered By: Levan Hurst on 05/26/2020 11:35:12 -------------------------------------------------------------------------------- Clinic Level of Care Assessment Details Patient Name: Date of Service: [place], [name] 05/26/2020 10:45 A M Medical Record Number: [id number] Patient Account Number: [id number] Date of Birth/Sex: Treating RN: Feb 21, 1945 (75 y.o. [name], [place] Primary Care Jacilyn Sanpedro: Theodora Blow, MIKE Other Clinician: Referring Catalina Salasar: Treating Iven Earnhart/Extender: Joanne Chars, MIKE Weeks in Treatment: 125 Clinic Level of Care Assessment  Items TOOL 4 Quantity Score X- 1 0 Use when only an EandM is performed on FOLLOW-UP visit ASSESSMENTS - Nursing Assessment / Reassessment X- 1 10 Reassessment of Co-morbidities (includes updates in patient status) X- 1 5 Reassessment of Adherence to Treatment Plan ASSESSMENTS - Wound and Skin A ssessment / Reassessment '[]'$  - 0 Simple Wound Assessment / Reassessment - one wound X- 1 5 Complex Wound Assessment / Reassessment - multiple wounds X- 1 10 Dermatologic / Skin Assessment (not related to wound area) ASSESSMENTS - Focused Assessment X- 1 5 Circumferential Edema Measurements - multi extremities X- 1 10 Nutritional Assessment / Counseling / Intervention '[]'$  - 0 Lower Extremity Assessment (monofilament, tuning fork, pulses) '[]'$  - 0 Peripheral Arterial Disease Assessment (using hand held doppler) ASSESSMENTS - Ostomy and/or Continence Assessment and Care '[]'$  - 0 Incontinence Assessment and Management '[]'$  - 0 Ostomy Care Assessment and Management (repouching, etc.) PROCESS - Coordination of Care '[]'$  - 0 Simple Patient / Family Education for ongoing care X- 1 20 Complex (extensive) Patient / Family Education for ongoing care X- 1 10 Staff obtains Programmer, systems, Records, T Results / Process Orders est '[]'$  - 0 Staff telephones HHA, Nursing Homes / Clarify orders / etc '[]'$  - 0 Routine Transfer to another Facility (non-emergent condition) '[]'$  - 0 Routine Hospital Admission (non-emergent condition) '[]'$  - 0 New Admissions / Biomedical engineer / Ordering NPWT Apligraf, etc. , '[]'$  - 0 Emergency Hospital Admission (emergent condition) '[]'$  - 0 Simple Discharge Coordination X- 1 15 Complex (extensive) Discharge Coordination PROCESS - Special Needs '[]'$  - 0 Pediatric / Minor Patient Management '[]'$  - 0 Isolation Patient Management '[]'$  - 0 Hearing / Language / Visual special needs '[]'$  - 0 Assessment of Community assistance (transportation, D/C planning, etc.) '[]'$  - 0 Additional  assistance / Altered mentation '[]'$  - 0 Support Surface(s) Assessment (bed, cushion, seat, etc.) INTERVENTIONS - Wound Cleansing / Measurement '[]'$  - 0 Simple Wound Cleansing - one wound  X- 1 5 Complex Wound Cleansing - multiple wounds X- 1 5 Wound Imaging (photographs - any number of wounds) '[]'$  - 0 Wound Tracing (instead of photographs) '[]'$  - 0 Simple Wound Measurement - one wound X- 1 5 Complex Wound Measurement - multiple wounds INTERVENTIONS - Wound Dressings '[]'$  - 0 Small Wound Dressing one or multiple wounds '[]'$  - 0 Medium Wound Dressing one or multiple wounds X- 2 20 Large Wound Dressing one or multiple wounds '[]'$  - 0 Application of Medications - topical '[]'$  - 0 Application of Medications - injection INTERVENTIONS - Miscellaneous '[]'$  - 0 External ear exam '[]'$  - 0 Specimen Collection (cultures, biopsies, blood, body fluids, etc.) '[]'$  - 0 Specimen(s) / Culture(s) sent or taken to Lab for analysis '[]'$  - 0 Patient Transfer (multiple staff / Civil Service fast streamer / Similar devices) '[]'$  - 0 Simple Staple / Suture removal (25 or less) '[]'$  - 0 Complex Staple / Suture removal (26 or more) '[]'$  - 0 Hypo / Hyperglycemic Management (close monitor of Blood Glucose) '[]'$  - 0 Ankle / Brachial Index (ABI) - do not check if billed separately X- 1 5 Vital Signs Has the patient been seen at the hospital within the last three years: Yes Total Score: 150 Level Of Care: New/Established - Level 4 Electronic Signature(s) Signed: 05/30/2020 6:08:57 PM By: Rhae Hammock RN Entered By: Rhae Hammock on 05/26/2020 12:32:09 -------------------------------------------------------------------------------- Encounter Discharge Information Details Patient Name: Date of Service: [name], [name] 05/26/2020 10:45 A M Medical Record Number: [id number] Patient Account Number: [id number] Date of Birth/Sex: Treating RN: [birth year] (75 y.o. [name] Primary Care Patrecia Veiga: Theodora Blow, MIKE Other  Clinician: Referring Ashanty Coltrane: Treating Jayliani Wanner/Extender: Joanne Chars, MIKE Weeks in Treatment: 125 Encounter Discharge Information Items Discharge Condition: Stable Ambulatory Status: Stretcher Discharge Destination: Home Transportation: Ambulance Accompanied By: EMS staff Schedule Follow-up Appointment: Yes Clinical Summary of Care: Patient Declined Electronic Signature(s) Signed: 05/30/2020 4:50:33 PM By: Baruch Gouty RN, BSN Entered By: Baruch Gouty on 05/26/2020 15:41:07 -------------------------------------------------------------------------------- Lower Extremity Assessment Details Patient Name: Date of Service: [name], [name] 05/26/2020 10:45 A M Medical Record Number: [id number] Patient Account Number: [id number] Date of Birth/Sex: Treating RN: [birth year] (75 y.o. [name] Primary Care Yanitza Shvartsman: Theodora Blow, MIKE Other Clinician: Referring Jhovany Weidinger: Treating Antwoine Zorn/Extender: Joanne Chars, MIKE Weeks in Treatment: 125 Edema Assessment Assessed: [Left: No] [Right: No] Edema: [Left: Yes] [Right: Yes] Calf Left: Right: Point of Measurement: From Medial Instep 40 cm 41 cm Ankle Left: Right: Point of Measurement: From Medial Instep 19 cm 19 cm Vascular Assessment Pulses: Dorsalis Pedis Palpable: [Left:Yes] [Right:Yes] Electronic Signature(s) Signed: 06/01/2020 6:30:22 PM By: Levan Hurst RN, BSN Entered By: Levan Hurst on 05/26/2020 11:35:56 -------------------------------------------------------------------------------- Multi Wound Chart Details Patient Name: Date of Service: [name], [name] 05/26/2020 10:45 A M Medical Record Number: [id number] Patient Account Number: [id number] Date of Birth/Sex: Treating RN: 1945-04-16 (75 y.o. [name], [name] Primary Care Laurianne Floresca: Theodora Blow, MIKE Other Clinician: Referring Emileigh Kellett: Treating Nyilah Kight/Extender: Joanne Chars, MIKE Weeks in Treatment:  125 Vital Signs Height(in): 56 Pulse(bpm): 69 Weight(lbs): 130 Blood Pressure(mmHg): 160/90 Body Mass Index(BMI): 20 Temperature(F): 97.8 Respiratory Rate(breaths/min): 18 Photos: [1:No Photos Right Hand - Dorsum] [20:No 70 Left T Third oe] [23:No Photos Right Elbow] Wound Location: [1:Gradually Appeared] [20:Gradually Appeared] [23:Pressure Injury] Wounding Event: [1:Atypical] [20:Diabetic Wound/Ulcer of the Lower] [23:Pressure Ulcer] Primary Etiology: [1:Anemia, Hypertension, Type II] [20:Extremity Anemia, Hypertension, Type II] [23:Anemia, Hypertension, Type II] Comorbid History: [1:Diabetes, Paraplegia 11/15/2017] [20:Diabetes,  Paraplegia 12/25/2019] [23:Diabetes, Paraplegia 05/26/2020] Date Acquired: [1:125] [20:21] [23:0] Weeks of Treatment: [1:Open] [20:Healed - Epithelialized] [23:Open] Wound Status: [1:Yes] [20:No] [23:No] Clustered Wound: [1:4] [20:N/A] [23:N/A] Clustered Quantity: [1:16.9x29x0.1] [20:0x0x0] [23:3x3.3x1] Measurements L x W x D (cm) [id number] [20:0] [id number] A (cm) : rea [1:38.492] [20:0] [id number] Volume (cm) : [1:-1107.20%] [20:100.00%] [23:N/A] % Reduction in Area: [1:-1107.00%] [20:100.00%] [23:N/A] % Reduction in Volume: [1:Full Thickness Without Exposed] [20:Grade 2] [23:Category/Stage III] Classification: [1:Support Structures Medium] [20:None Present] [23:Medium] Exudate Amount: [1:Purulent] [20:N/A] [23:Serosanguineous] Exudate Type: [1:yellow, brown, green] [20:N/A] [23:red, brown] Exudate Color: [1:Yes] [20:No] [23:No] Foul Odor A Cleansing: [1:fter No] [20:N/A] [23:N/A] Odor Anticipated Due to Product Use: [1:Indistinct, nonvisible] [20:Flat and Intact] [23:Distinct, outline attached] Wound Margin: [1:Large (67-100%)] [20:None Present (0%)] [23:Medium (34-66%)] Granulation A mount: [1:Pink] [20:N/A] [23:Pink] Granulation Quality: [1:Small (1-33%)] [20:None Present (0%)] [23:Medium (34-66%)] Necrotic Amount: [1:Fat Layer  (Subcutaneous Tissue): Yes Fascia: No] [23:Fat Layer (Subcutaneous Tissue): Yes] Exposed Structures: [1:Fascia: No Tendon: No Muscle: No Joint: No Bone: No Small (1-33%)] [20:Fat Layer (Subcutaneous Tissue): No Tendon: No Muscle: No Joint: No Bone: No Large (67-100%)] [23:Fascia: No Tendon: No Muscle: No Joint: No Bone: No None] Treatment Notes Electronic Signature(s) Signed: 05/26/2020 3:44:17 PM By: Linton Ham MD Signed: 05/30/2020 6:08:57 PM By: Rhae Hammock RN Signed: 05/30/2020 6:08:57 PM By: Rhae Hammock RN Entered By: Linton Ham on 05/26/2020 12:15:13 -------------------------------------------------------------------------------- Multi-Disciplinary Care Plan Details Patient Name: Date of Service: [place], [name] 05/26/2020 10:45 A M Medical Record Number: [id number] Patient Account Number: [id number] Date of Birth/Sex: Treating RN: 1945/12/21 (75 y.o. [name], [name] Primary Care Rosangela Fehrenbach: Theodora Blow, MIKE Other Clinician: Referring Rion Catala: Treating Lorre Opdahl/Extender: Joanne Chars, MIKE Weeks in Treatment: [place] reviewed with physician Active Inactive Electronic Signature(s) Signed: 05/30/2020 6:08:57 PM By: Rhae Hammock RN Entered By: Rhae Hammock on 05/26/2020 12:28:24 -------------------------------------------------------------------------------- Pain Assessment Details Patient Name: Date of Service: Regino Schultze RD R. 05/26/2020 10:45 A M Medical Record Number: [id number] Patient Account Number: [id number] Date of Birth/Sex: Treating RN: May 13, 1945 (75 y.o. [name] Primary Care Jaki Steptoe: Theodora Blow, MIKE Other Clinician: Referring Mehran Guderian: Treating Javaya [place]/Extender: Joanne Chars, MIKE Weeks in Treatment: 125 Active Problems Location of Pain Severity and Description of Pain Patient Has Paino Yes Site Locations Pain Location: Pain in Ulcers With Dressing Change:  Yes Rate the pain. Current Pain Level: 6 Character of Pain Describe the Pain: Throbbing Pain Management and Medication Current Pain Management: Medication: Yes Cold Application: No Rest: No Massage: No Activity: No T.E.N.S.: No Heat Application: No Leg drop or elevation: No Is the Current Pain Management Adequate: Adequate How does your wound impact your activities of daily livingo Sleep: No Bathing: No Appetite: No Relationship With Others: No Bladder Continence: No Emotions: No Bowel Continence: No Work: No Toileting: No Drive: No Dressing: No Hobbies: No Engineer, maintenance) Signed: 06/01/2020 6:30:22 PM By: Levan Hurst RN, BSN Entered By: Levan Hurst on 05/26/2020 11:35:50 -------------------------------------------------------------------------------- Patient/Caregiver Education Details Patient Name: Date of Service: Regino Schultze RD R. 5/12/2022andnbsp10:45 A M Medical Record Number: [id number] Patient Account Number: [id number] Date of Birth/Gender: Treating RN: 1945/12/30 (75 y.o. Erie Noe Primary Care Physician: Theodora Blow, MIKE Other Clinician: Referring Physician: Treating Physician/Extender: Joanne Chars, MIKE Weeks in Treatment: 125 Education Assessment Education Provided To: Patient Education Topics Provided Wound/Skin Impairment: Methods: Explain/Verbal Responses: State content correctly Electronic Signature(s) Signed: 05/30/2020 6:08:57 PM By: Rhae Hammock RN Entered By: Rhae Hammock on 05/26/2020 12:28:38 -------------------------------------------------------------------------------- Wound Assessment  Details Patient Name: Date of Service: [place], HORRY RD R. 05/26/2020 10:45 A M Medical Record Number: [id number] Patient Account Number: [id number] Date of Birth/Sex: Treating RN: 05-24-1945 (75 y.o. [name] Primary Care Toshika Parrow: Theodora Blow, MIKE Other Clinician: Referring Machelle Raybon: Treating  Lainie Daubert/Extender: Joanne Chars, MIKE Weeks in Treatment: 125 Wound Status Wound Number: 1 Primary Etiology: Atypical Wound Location: Right Hand - Dorsum Wound Status: Open Wounding Event: Gradually Appeared Comorbid History: Anemia, Hypertension, Type II Diabetes, Paraplegia Date Acquired: 11/15/2017 Weeks Of Treatment: 125 Clustered Wound: Yes Photos Wound Measurements Length: (cm) 16.9 Width: (cm) 29 Depth: (cm) 0.1 Clustered Quantity: 4 Area: (cm) 384.924 Volume: (cm) 38.492 % Reduction in Area: -1107.2% % Reduction in Volume: -1107% Epithelialization: Small (1-33%) Tunneling: No Undermining: No Wound Description Classification: Full Thickness Without Exposed Support Structures Wound Margin: Indistinct, nonvisible Exudate Amount: Medium Exudate Type: Purulent Exudate Color: yellow, brown, green Foul Odor After Cleansing: Yes Due to Product Use: No Slough/Fibrino Yes Wound Bed Granulation Amount: Large (67-100%) Exposed Structure Granulation Quality: Pink Fascia Exposed: No Necrotic Amount: Small (1-33%) Fat Layer (Subcutaneous Tissue) Exposed: Yes Necrotic Quality: Adherent Slough Tendon Exposed: No Muscle Exposed: No Joint Exposed: No Bone Exposed: No Treatment Notes Wound #1 (Hand - Dorsum) Wound Laterality: Right Cleanser Peri-Wound Care Topical Triamcinolone Discharge Instruction: Apply Triamcinolone as directed Primary Dressing Secondary Dressing Secured With Elastic Bandage 4 inch (ACE bandage) Discharge Instruction: Secure with ACE bandage to above elbow Kerlix Roll Sterile, 4.5x3.1 (in/yd) Discharge Instruction: Secure with Kerlix as directed. Compression Wrap Compression Stockings Add-Ons Electronic Signature(s) Signed: 05/26/2020 3:37:13 PM By: Sandre Kitty Signed: 06/01/2020 6:30:22 PM By: Levan Hurst RN, BSN Entered By: Sandre Kitty on 05/26/2020  15:19:24 -------------------------------------------------------------------------------- Wound Assessment Details Patient Name: Date of Service: [name], [name] 05/26/2020 10:45 A M Medical Record Number: [id number] Patient Account Number: [id number] Date of Birth/Sex: Treating RN: [birth year] (75 y.o. [name] Primary Care Joas Motton: Theodora Blow, MIKE Other Clinician: Referring Hong Moring: Treating Aarna Mihalko/Extender: Joanne Chars, MIKE Weeks in Treatment: 125 Wound Status Wound Number: 20 Primary Etiology: Diabetic Wound/Ulcer of the Lower Extremity Wound Location: Left T Third oe Wound Status: Healed - Epithelialized Wounding Event: Gradually Appeared Comorbid History: Anemia, Hypertension, Type II Diabetes, Paraplegia Date Acquired: 12/25/2019 Weeks Of Treatment: 21 Clustered Wound: No Wound Measurements Length: (cm) Width: (cm) Depth: (cm) Area: (cm) Volume: (cm) 0 % Reduction in Area: 100% 0 % Reduction in Volume: 100% 0 Epithelialization: Large (67-100%) 0 Tunneling: No 0 Undermining: No Wound Description Classification: Grade 2 Wound Margin: Flat and Intact Exudate Amount: None Present Foul Odor After Cleansing: No Slough/Fibrino No Wound Bed Granulation Amount: None Present (0%) Exposed Structure Necrotic Amount: None Present (0%) Fascia Exposed: No Fat Layer (Subcutaneous Tissue) Exposed: No Tendon Exposed: No Muscle Exposed: No Joint Exposed: No Bone Exposed: No Electronic Signature(s) Signed: 06/01/2020 6:30:22 PM By: Levan Hurst RN, BSN Entered By: Levan Hurst on 05/26/2020 11:37:25 -------------------------------------------------------------------------------- Wound Assessment Details Patient Name: Date of Service: [name], [name] 05/26/2020 10:45 A M Medical Record Number: [id number] Patient Account Number: [id number] Date of Birth/Sex: Treating RN: Dec 02, 1945 (75 y.o. [name] Primary Care Hoby Kawai: Theodora Blow, MIKE Other Clinician: Referring Joshuah Minella: Treating Shanterica Biehler/Extender: Joanne Chars, MIKE Weeks in Treatment: 125 Wound Status Wound Number: 23 Primary Etiology: Pressure Ulcer Wound Location: Right Elbow Wound Status: Open Wounding Event: Pressure Injury Comorbid History: Anemia, Hypertension, Type II Diabetes, Paraplegia Date Acquired: 05/26/2020 Weeks Of Treatment: 0 Clustered Wound: No  Photos Wound Measurements Length: (cm) 3 Width: (cm) 3.3 Depth: (cm) 1 Area: (cm) 7.775 Volume: (cm) 7.775 % Reduction in Area: 0% % Reduction in Volume: 0% Epithelialization: None Tunneling: No Undermining: No Wound Description Classification: Category/Stage III Wound Margin: Distinct, outline attached Exudate Amount: Medium Exudate Type: Serosanguineous Exudate Color: red, brown Foul Odor After Cleansing: No Slough/Fibrino Yes Wound Bed Granulation Amount: Medium (34-66%) Exposed Structure Granulation Quality: Pink Fascia Exposed: No Necrotic Amount: Medium (34-66%) Fat Layer (Subcutaneous Tissue) Exposed: Yes Necrotic Quality: Adherent Slough Tendon Exposed: No Muscle Exposed: No Joint Exposed: No Bone Exposed: No Treatment Notes Wound #23 (Elbow) Wound Laterality: Right Cleanser Peri-Wound Care Topical Primary Dressing KerraCel Ag Gelling Fiber Dressing, 2x2 in (silver alginate) Discharge Instruction: Apply silver alginate to wound bed as instructed Secondary Dressing Woven Gauze Sponge, Non-Sterile 4x4 in Discharge Instruction: Apply over primary dressing as directed. Secured With Elastic Bandage 4 inch (ACE bandage) Discharge Instruction: Secure with ACE bandage as directed. Kerlix Roll Sterile, 4.5x3.1 (in/yd) Discharge Instruction: Secure with Kerlix as directed. Compression Wrap Compression Stockings Add-Ons Electronic Signature(s) Signed: 05/26/2020 3:37:13 PM By: Sandre Kitty Signed: 06/01/2020 6:30:22 PM By: Levan Hurst RN,  BSN Entered By: Sandre Kitty on 05/26/2020 15:19:04 -------------------------------------------------------------------------------- Vitals Details Patient Name: Date of Service: [name], [name] 05/26/2020 10:45 A M Medical Record Number: [id number] Patient Account Number: [id number] Date of Birth/Sex: Treating RN: [birth year] (75 y.o. [name] Primary Care Mell Mellott: Theodora Blow, MIKE Other Clinician: Referring Everton Bertha: Treating Manasvi Dickard/Extender: Joanne Chars, MIKE Weeks in Treatment: 125 Vital Signs Time Taken: 11:05 Temperature (F): 97.8 Height (in): 67 Pulse (bpm): 76 Weight (lbs): 130 Respiratory Rate (breaths/min): 18 Body Mass Index (BMI): 20.4 Blood Pressure (mmHg): 160/90 Reference Range: 80 - 120 mg / dl Electronic Signature(s) Signed: 06/01/2020 6:30:22 PM By: Levan Hurst RN, BSN Entered By: Levan Hurst on 05/26/2020 11:35:33

## 2020-06-02 ENCOUNTER — Encounter (HOSPITAL_BASED_OUTPATIENT_CLINIC_OR_DEPARTMENT_OTHER): Payer: Medicare Other | Admitting: Internal Medicine

## 2020-06-09 ENCOUNTER — Encounter (HOSPITAL_BASED_OUTPATIENT_CLINIC_OR_DEPARTMENT_OTHER): Payer: Medicare Other | Admitting: Internal Medicine

## 2020-06-09 ENCOUNTER — Other Ambulatory Visit: Payer: Self-pay

## 2020-06-09 DIAGNOSIS — E11622 Type 2 diabetes mellitus with other skin ulcer: Secondary | ICD-10-CM | POA: Diagnosis not present

## 2020-06-09 DIAGNOSIS — L89016 Pressure-induced deep tissue damage of right elbow: Secondary | ICD-10-CM

## 2020-06-09 DIAGNOSIS — S61401D Unspecified open wound of right hand, subsequent encounter: Secondary | ICD-10-CM | POA: Diagnosis not present

## 2020-06-09 NOTE — Progress Notes (Signed)
[name] [name], FANA ([id number]) Visit Report for 06/09/2020 Arrival Information Details Patient Name: Date of Service: TAEDEN, GELLER RD R. 06/09/2020 10:45 A M Medical Record Number: [id number] Patient Account Number: [id number] Date of Birth/Sex: Treating RN: Apr 16, 1945 (75 y.o. Marcheta Grammes Primary Care Eeshan Verbrugge: Theodora Blow, MIKE Other Clinician: Referring Demontray Franta: Treating Kazuto Sevey/Extender: Verita Schneiders, MIKE Weeks in Treatment: 58 Visit Information History Since Last Visit Added or deleted any medications: No Patient Arrived: Stretcher Any new allergies or adverse reactions: No Arrival Time: 10:58 Had a fall or experienced change in No Accompanied By: EMS staff activities of daily living that may affect Transfer Assistance: [name] [name] risk of falls: Patient Identification Verified: Yes Signs or symptoms of abuse/neglect since last visito No Secondary Verification Process Completed: Yes Hospitalized since last visit: No Patient Requires Transmission-Based Precautions: No Implantable device outside of the clinic excluding No Patient Has Alerts: Yes cellular tissue based products placed in the center Patient Alerts: R ABI non compressible since last visit: Has Dressing in Place as Prescribed: Yes Pain Present Now: No Electronic Signature(s) Signed: 06/09/2020 5:35:44 PM By: Lorrin Jackson Entered By: Lorrin Jackson on 06/09/2020 11:03:35 -------------------------------------------------------------------------------- Clinic Level of Care Assessment Details Patient Name: Date of Service: [name] [name], [name] 06/09/2020 10:45 A M Medical Record Number: [id number] Patient Account Number: [id number] Date of Birth/Sex: Treating RN: 1945/09/09 (75 y.o. [name], [name] Primary Care Jonise Weightman: Theodora Blow, MIKE Other Clinician: Referring Raywood Wailes: Treating Rashawn Rolon/Extender: Verita Schneiders, MIKE Weeks in Treatment: 127 Clinic Level of Care Assessment Items TOOL 4  Quantity Score X- 1 0 Use when only an EandM is performed on FOLLOW-UP visit ASSESSMENTS - Nursing Assessment / Reassessment X- 1 10 Reassessment of Co-morbidities (includes updates in patient status) X- 1 5 Reassessment of Adherence to Treatment Plan ASSESSMENTS - Wound and Skin A ssessment / Reassessment _0  - 0 Simple Wound Assessment / Reassessment - one wound X- 2 5 Complex Wound Assessment / Reassessment - multiple wounds X- 1 10 Dermatologic / Skin Assessment (not related to wound area) ASSESSMENTS - Focused Assessment _1  - 0 Circumferential Edema Measurements - multi extremities X- 1 10 Nutritional Assessment / Counseling / Intervention _2  - 0 Lower Extremity Assessment (monofilament, tuning fork, pulses) _3  - 0 Peripheral Arterial Disease Assessment (using hand held doppler) ASSESSMENTS - Ostomy and/or Continence Assessment and Care _4  - 0 Incontinence Assessment and Management _5  - 0 Ostomy Care Assessment and Management (repouching, etc.) PROCESS - Coordination of Care _6  - 0 Simple Patient / Family Education for ongoing care X- 1 20 Complex (extensive) Patient / Family Education for ongoing care X- 1 10 Staff obtains Programmer, systems, Records, T Results / Process Orders est _7  - 0 Staff telephones HHA, Nursing Homes / Clarify orders / etc _8  - 0 Routine Transfer to another Facility (non-emergent condition) _9  - 0 Routine Hospital Admission (non-emergent condition) _10  - 0 New Admissions / Biomedical engineer / Ordering NPWT Apligraf, etc. , _11  - 0 Emergency Hospital Admission (emergent condition) _12  - 0 Simple Discharge Coordination X- 1 15 Complex (extensive) Discharge Coordination PROCESS - Special Needs _13  - 0 Pediatric / Minor Patient Management _14  - 0 Isolation Patient Management _15  - 0 Hearing / Language / Visual special needs _16  - 0 Assessment of Community assistance (transportation, D/C planning, etc.) _17  - 0 Additional assistance / Altered  mentation _18  - 0 Support Surface(s) Assessment (bed, cushion, seat, etc.) INTERVENTIONS - Wound Cleansing / Measurement _19  - 0 Simple Wound Cleansing - one wound X-  2 5 Complex Wound Cleansing - multiple wounds X- 1 5 Wound Imaging (photographs - any number of wounds) _0  - 0 Wound Tracing (instead of photographs) _1  - 0 Simple Wound Measurement - one wound X- 2 5 Complex Wound Measurement - multiple wounds INTERVENTIONS - Wound Dressings X - Small Wound Dressing one or multiple wounds 1 10 X- 1 15 Medium Wound Dressing one or multiple wounds _2  - 0 Large Wound Dressing one or multiple wounds <[id number]>_4<\/[id number]>_2  - 0 Application of Medications - topical <JJHERDEYCXKGYJEH>_6<\/DJSHFWYOVZCHYIFO>_2  - 0 Application of Medications - injection INTERVENTIONS - Miscellaneous _5  - 0 External ear exam _6  - 0 Specimen Collection (cultures, biopsies, blood, body fluids, etc.) _7  - 0 Specimen(s) / Culture(s) sent or taken to Lab for analysis _8  - 0 Patient Transfer (multiple staff / Civil Service fast streamer / Similar devices) _9  - 0 Simple Staple / Suture removal (25 or less) _10  - 0 Complex Staple / Suture removal (26 or more) _11  - 0 Hypo / Hyperglycemic Management (close monitor of Blood Glucose) _12  - 0 Ankle / Brachial Index (ABI) - do not check if billed separately X- 1 5 Vital Signs Has the patient been seen at the hospital within the last three years: Yes Total Score: 145 Level Of Care: New/Established - Level 4 Electronic Signature(s) Signed: 06/09/2020 6:02:39 PM By: Deon Pilling Entered By: Deon Pilling on 06/09/2020 13:38:56 -------------------------------------------------------------------------------- Lower Extremity Assessment Details Patient Name: Date of Service: [name] [name], MISSILDINE RD R. 06/09/2020 10:45 A M Medical Record Number: [id number] Patient Account Number: [id number] Date of Birth/Sex: Treating RN: 11-Mar-1945 (75 y.o. Marcheta Grammes Primary Care Rook Maue: Theodora Blow, MIKE Other Clinician: Referring Mathis Cashman: Treating  Benna Arno/Extender: Verita Schneiders, MIKE Weeks in Treatment: 127 Electronic Signature(s) Signed: 06/09/2020 5:35:44 PM By: Lorrin Jackson Entered By: Lorrin Jackson on 06/09/2020 11:04:10 -------------------------------------------------------------------------------- Multi Wound Chart Details Patient Name: Date of Service: [name] [name] Schultze RD R. 06/09/2020 10:45 A M Medical Record Number: [id number] Patient Account Number: [id number] Date of Birth/Sex: Treating RN: 07-02-1945 (75 y.o. [name], [name] Primary Care Chinita Schimpf: Theodora Blow, MIKE Other Clinician: Referring Taliesin Hartlage: Treating Tacia Hindley/Extender: Verita Schneiders, MIKE Weeks in Treatment: 127 Vital Signs Height(in): 67 Pulse(bpm): 79 Weight(lbs): 130 Blood Pressure(mmHg): 130/79 Body Mass Index(BMI): 20 Temperature(F): 98.4 Respiratory Rate(breaths/min): 16 Photos: [1:No Photos Right Hand - Dorsum] [23:No Photos Right Elbow] [N/A:N/A N/A] Wound Location: [1:Gradually Appeared] [23:Pressure Injury] [N/A:N/A] Wounding Event: [1:Atypical] [23:Pressure Ulcer] [N/A:N/A] Primary Etiology: [1:Anemia, Hypertension, Type II] [23:Anemia, Hypertension, Type II] [N/A:N/A] Comorbid History: [1:Diabetes, Paraplegia 11/15/2017] [23:Diabetes, Paraplegia 05/26/2020] [N/A:N/A] Date Acquired: [1:127] [23:2] [N/A:N/A] Weeks of Treatment: [1:Open] [23:Open] [N/A:N/A] Wound Status: [1:Yes] [23:No] [N/A:N/A] Clustered Wound: [1:4] [23:N/A] [N/A:N/A] Clustered Quantity: [1:1x1x0.1] [23:2.2x3x0.6] [N/A:N/A] Measurements L x W x D (cm) [1:0.785] [23:5.184] [N/A:N/A] A (cm) : rea [1:0.079] [23:3.11] [N/A:N/A] Volume (cm) : [1:97.50%] [23:33.30%] [N/A:N/A] % Reduction in Area: [1:97.50%] [23:60.00%] [N/A:N/A] % Reduction in Volume: [1:Full Thickness Without Exposed] [23:Category/Stage III] [N/A:N/A] Classification: [1:Support Structures Medium] [23:Medium] [N/A:N/A] Exudate Amount: [1:Serosanguineous] [23:Serosanguineous]  [N/A:N/A] Exudate Type: [1:red, brown] [23:red, brown] [N/A:N/A] Exudate Color: [1:Distinct, outline attached] [23:Well defined, not attached] [N/A:N/A] Wound Margin: [1:Large (67-100%)] [23:Large (67-100%)] [N/A:N/A] Granulation Amount: [1:Red, Pink] [23:Red, Pink] [N/A:N/A] Granulation Quality: [1:[name] [name] Present (0%)] [23:Small (1-33%)] [N/A:N/A] Necrotic Amount: [1:Fat Layer (Subcutaneous Tissue): Yes Fat Layer (Subcutaneous Tissue): Yes N/A] Exposed Structures: [1:Fascia: No Tendon: No Muscle: No Joint: No Bone: No Small (1-33%)] [23:Fascia: No Tendon: No Muscle: No Joint: No Bone: No Small (1-33%)] [N/A:N/A] Treatment Notes Electronic Signature(s) Signed: 06/09/2020 12:43:26  PM By: Kalman Shan DO Signed: 06/09/2020 6:02:39 PM By: Deon Pilling Entered By: Kalman Shan on 06/09/2020 12:38:32 -------------------------------------------------------------------------------- Multi-Disciplinary Care Plan Details Patient Name: Date of Service: [name] [name] Primer, [name] 06/09/2020 10:45 A M Medical Record Number: [id number] Patient Account Number: [id number] Date of Birth/Sex: Treating RN: [birth year] (75 y.o. [name], [name] Primary Care Jennelle Pinkstaff: Theodora Blow, MIKE Other Clinician: Referring Samayah Novinger: Treating Londen Lorge/Extender: Verita Schneiders, MIKE Weeks in Treatment: [place] reviewed with physician Active Inactive Pain, Acute or Chronic Nursing Diagnoses: Pain, acute or chronic: actual or potential Potential alteration in comfort, pain Goals: Patient will verbalize adequate pain control and receive pain control interventions during procedures as needed Date Initiated: 06/09/2020 Target Resolution Date: 07/20/2020 Goal Status: Active Patient/caregiver will verbalize comfort level met Date Initiated: 06/09/2020 Target Resolution Date: 07/14/2020 Goal Status: Active Interventions: Encourage patient to take pain medications as prescribed Provide  education on pain management Reposition patient for comfort Treatment Activities: Administer pain control measures as ordered : 06/09/2020 Notes: Soft Tissue Infection Nursing Diagnoses: Impaired tissue integrity Knowledge deficit related to disease process and management Goals: Patient/caregiver will verbalize understanding of or measures to prevent infection and contamination in the home setting Date Initiated: 06/09/2020 Target Resolution Date: 07/22/2020 Goal Status: Active Patient's soft tissue infection will resolve Date Initiated: 06/09/2020 Target Resolution Date: 06/30/2020 Goal Status: Active Interventions: Assess signs and symptoms of infection every visit Provide education on infection Treatment Activities: Systemic antibiotics : 06/09/2020 T ordered outside of clinic : 06/09/2020 est Notes: Electronic Signature(s) Signed: 06/09/2020 6:02:39 PM By: Deon Pilling Entered By: Deon Pilling on 06/09/2020 11:15:07 -------------------------------------------------------------------------------- Pain Assessment Details Patient Name: Date of Service: SHUN, PLETZ RD R. 06/09/2020 10:45 A M Medical Record Number: [id number] Patient Account Number: [id number] Date of Birth/Sex: Treating RN: [birth year] (75 y.o. Marcheta Grammes Primary Care [place]: Theodora Blow, MIKE Other Clinician: Referring Rashaun Wichert: Treating Luberta Grabinski/Extender: Verita Schneiders, MIKE Weeks in Treatment: 127 Active Problems Location of Pain Severity and Description of Pain Patient Has Paino No Site Locations Pain Management and Medication Current Pain Management: Electronic Signature(s) Signed: 06/09/2020 5:35:44 PM By: Lorrin Jackson Entered By: Lorrin Jackson on 06/09/2020 11:04:03 -------------------------------------------------------------------------------- Patient/Caregiver Education Details Patient Name: Date of Service: [name] [name] Schultze RD R. 5/26/2022andnbsp10:45 A M Medical Record  Number: [id number] Patient Account Number: [id number] Date of Birth/Gender: Treating RN: Nov 08, 1945 (75 y.o. [name] Primary Care Physician: Theodora Blow, MIKE Other Clinician: Referring Physician: Treating Physician/Extender: Verita Schneiders, MIKE Weeks in Treatment: 127 Education Assessment Education Provided To: Patient Education Topics Provided Pain: Handouts: A Guide to Pain Control Methods: Explain/Verbal Responses: Reinforcements needed Electronic Signature(s) Signed: 06/09/2020 6:02:39 PM By: Deon Pilling Entered By: Deon Pilling on 06/09/2020 11:15:19 -------------------------------------------------------------------------------- Wound Assessment Details Patient Name: Date of Service: [name] [name], STARNES RD R. 06/09/2020 10:45 A M Medical Record Number: [id number] Patient Account Number: [id number] Date of Birth/Sex: Treating RN: 07-15-1945 (75 y.o. Marcheta Grammes Primary Care Lawsen Arnott: Theodora Blow, MIKE Other Clinician: Referring Pearl Bents: Treating Delynn Pursley/Extender: Verita Schneiders, MIKE Weeks in Treatment: 127 Wound Status Wound Number: 1 Primary Etiology: Atypical Wound Location: Right Hand - Dorsum Wound Status: Open Wounding Event: Gradually Appeared Comorbid History: Anemia, Hypertension, Type II Diabetes, Paraplegia Date Acquired: 11/15/2017 Weeks Of Treatment: 127 Clustered Wound: Yes Photos Wound Measurements Length: (cm) 1 Width: (cm) 1 Depth: (cm) 0.1 Clustered Quantity: 4 Area: (cm) 0.785 Volume: (cm) 0.079 % Reduction in Area: 97.5% % Reduction in Volume: 97.5% Epithelialization: Small (1-33%) Tunneling: No  Undermining: No Wound Description Classification: Full Thickness Without Exposed Support Structures Wound Margin: Distinct, outline attached Exudate Amount: Medium Exudate Type: Serosanguineous Exudate Color: red, brown Foul Odor After Cleansing: No Slough/Fibrino No Wound Bed Granulation Amount: Large  (67-100%) Exposed Structure Granulation Quality: Red, Pink Fascia Exposed: No Necrotic Amount: [name] [name] Present (0%) Fat Layer (Subcutaneous Tissue) Exposed: Yes Tendon Exposed: No Muscle Exposed: No Joint Exposed: No Bone Exposed: No Electronic Signature(s) Signed: 06/09/2020 4:57:49 PM By: Sandre Kitty Signed: 06/09/2020 5:35:44 PM By: Lorrin Jackson Entered By: Sandre Kitty on 06/09/2020 16:42:58 -------------------------------------------------------------------------------- Wound Assessment Details Patient Name: Date of Service: [name] [name] Primer, [name] 06/09/2020 10:45 A M Medical Record Number: [id number] Patient Account Number: [id number] Date of Birth/Sex: Treating RN: 01/08/1946 (75 y.o. Marcheta Grammes Primary Care Berlie Hatchel: Theodora Blow, MIKE Other Clinician: Referring Maralyn Witherell: Treating Arie Gable/Extender: Verita Schneiders, MIKE Weeks in Treatment: 127 Wound Status Wound Number: [id number] Primary Etiology: Pressure Ulcer Wound Location: Right Elbow Wound Status: Open Wounding Event: Pressure Injury Comorbid History: Anemia, Hypertension, Type II Diabetes, Paraplegia Date Acquired: 05/26/2020 Weeks Of Treatment: 2 Clustered Wound: No Photos Wound Measurements Length: (cm) 2.2 Width: (cm) 3 Depth: (cm) 0.6 Area: (cm) 5.184 Volume: (cm) 3.11 Wound Description Classification: Category/Stage III Wound Margin: Well defined, not attached Exudate Amount: Medium Exudate Type: Serosanguineous Exudate Color: red, brown Foul Odor After Cleansing: Slough/Fibrino % Reduction in Area: 33.3% % Reduction in Volume: 60% Epithelialization: Small (1-33%) Tunneling: No Undermining: No No Yes Wound Bed Granulation Amount: Large (67-100%) Exposed Structure Granulation Quality: Red, Pink Fascia Exposed: No Necrotic Amount: Small (1-33%) Fat Layer (Subcutaneous Tissue) Exposed: Yes Necrotic Quality: Adherent Slough Tendon Exposed: No Muscle Exposed: No Joint Exposed:  No Bone Exposed: No Electronic Signature(s) Signed: 06/09/2020 4:57:49 PM By: Sandre Kitty Signed: 06/09/2020 5:35:44 PM By: Lorrin Jackson Entered By: Sandre Kitty on 06/09/2020 16:42:03 -------------------------------------------------------------------------------- Vitals Details Patient Name: Date of Service: [name] [name] Primer, [name] 06/09/2020 10:45 A M Medical Record Number: [id number] Patient Account Number: [id number] Date of Birth/Sex: Treating RN: [birth year] (75 y.o. Marcheta Grammes Primary Care Jo Booze: Theodora Blow, MIKE Other Clinician: Referring Talayla Doyel: Treating Prestin Munch/Extender: Verita Schneiders, MIKE Weeks in Treatment: 127 Vital Signs Time Taken: 11:03 Temperature (F): 98.4 Height (in): 67 Pulse (bpm): 79 Weight (lbs): 130 Respiratory Rate (breaths/min): 16 Body Mass Index (BMI): 20.4 Blood Pressure (mmHg): 130/79 Reference Range: 80 - 120 mg / dl Electronic Signature(s) Signed: 06/09/2020 5:35:44 PM By: Lorrin Jackson Entered By: Lorrin Jackson on 06/09/2020 11:03:55
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
## 2020-06-28 ENCOUNTER — Other Ambulatory Visit: Payer: Self-pay

## 2020-06-28 ENCOUNTER — Encounter (HOSPITAL_BASED_OUTPATIENT_CLINIC_OR_DEPARTMENT_OTHER): Payer: Medicare Other | Attending: Internal Medicine | Admitting: Internal Medicine

## 2020-06-28 DIAGNOSIS — G8250 Quadriplegia, unspecified: Secondary | ICD-10-CM | POA: Insufficient documentation

## 2020-06-28 DIAGNOSIS — E119 Type 2 diabetes mellitus without complications: Secondary | ICD-10-CM | POA: Insufficient documentation

## 2020-06-28 DIAGNOSIS — L03113 Cellulitis of right upper limb: Secondary | ICD-10-CM | POA: Insufficient documentation

## 2020-06-28 DIAGNOSIS — Z8616 Personal history of COVID-19: Secondary | ICD-10-CM | POA: Insufficient documentation

## 2020-06-28 DIAGNOSIS — G8220 Paraplegia, unspecified: Secondary | ICD-10-CM | POA: Insufficient documentation

## 2020-06-28 DIAGNOSIS — Z8673 Personal history of transient ischemic attack (TIA), and cerebral infarction without residual deficits: Secondary | ICD-10-CM | POA: Insufficient documentation

## 2020-06-28 DIAGNOSIS — X58XXXA Exposure to other specified factors, initial encounter: Secondary | ICD-10-CM | POA: Diagnosis not present

## 2020-06-28 DIAGNOSIS — S61401A Unspecified open wound of right hand, initial encounter: Secondary | ICD-10-CM | POA: Insufficient documentation

## 2020-06-28 DIAGNOSIS — L89016 Pressure-induced deep tissue damage of right elbow: Secondary | ICD-10-CM | POA: Diagnosis not present
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
## 2020-06-29 NOTE — Progress Notes (Signed)
[name] [name], [name] [name] ([id number]) Visit Report for 06/28/2020 Arrival Information Details Patient Name: Date of Service: [name] [name], [name] RD R. 06/28/2020 10:45 A M Medical Record Number: [id number] Patient Account Number: [id number] Date of Birth/Sex: Treating RN: February 12, 1945 (75 y.o. [name] [name] Primary Care [name]: Theodora Blow, MIKE Other Clinician: Referring [name]: Treating [name]/Extender: Joanne Chars, MIKE Weeks in Treatment: 130 Visit Information History Since Last Visit Added or deleted any medications: No Patient Arrived: Stretcher Any new allergies or adverse reactions: No Arrival Time: 10:52 Had a fall or experienced change in No Accompanied By: EMS activities of daily living that may affect Transfer Assistance: Stretcher risk of falls: Patient Identification Verified: Yes Signs or symptoms of abuse/neglect since last visito No Secondary Verification Process Completed: Yes Hospitalized since last visit: No Patient Requires Transmission-Based Precautions: No Implantable device outside of the clinic excluding No Patient Has Alerts: Yes cellular tissue based products placed in the center Patient Alerts: R ABI non compressible since last visit: Has Dressing in Place as Prescribed: Yes Pain Present Now: No Electronic Signature(s) Signed: 06/28/2020 6:10:01 PM By: Baruch Gouty RN, BSN Entered By: Baruch Gouty on 06/28/2020 10:54:32 -------------------------------------------------------------------------------- Encounter Discharge Information Details Patient Name: Date of Service: [name] [name] RD R. 06/28/2020 10:45 A M Medical Record Number: [id number] Patient Account Number: [id number] Date of Birth/Sex: Treating RN: February 13, 1945 (75 y.o. [name] [name] Primary Care Lainee Lehrman: Theodora Blow, MIKE Other Clinician: Referring Thiago Ragsdale: Treating [name]/Extender: Joanne Chars, MIKE Weeks in Treatment: 130 Encounter Discharge Information Items  Post Procedure Vitals Discharge Condition: Stable Temperature (F): 98.6 Ambulatory Status: Stretcher Pulse (bpm): 76 Discharge Destination: Home Respiratory Rate (breaths/min): 18 Transportation: Ambulance Blood Pressure (mmHg): 109/74 Schedule Follow-up Appointment: Yes Clinical Summary of Care: Provided on 06/28/2020 Form Type Recipient Paper Patient Patient Electronic Signature(s) Signed: 06/28/2020 12:36:10 PM By: Lorrin Jackson Entered By: Lorrin Jackson on 06/28/2020 12:36:10 -------------------------------------------------------------------------------- Lower Extremity Assessment Details Patient Name: Date of Service: [name] [name], [name] RD R. 06/28/2020 10:45 A M Medical Record Number: [id number] Patient Account Number: [id number] Date of Birth/Sex: Treating RN: [birth year] (75 y.o. [name] [name] Primary Care Lola Lofaro: Theodora Blow, MIKE Other Clinician: Referring Shihab States: Treating [name]/Extender: Joanne Chars, MIKE Weeks in Treatment: 130 Electronic Signature(s) Signed: 06/28/2020 6:10:01 PM By: Baruch Gouty RN, BSN Entered By: Baruch Gouty on 06/28/2020 11:06:49 -------------------------------------------------------------------------------- Multi Wound Chart Details Patient Name: Date of Service: [name] [name] RD R. 06/28/2020 10:45 A M Medical Record Number: [id number] Patient Account Number: [id number] Date of Birth/Sex: Treating RN: 1945-12-22 (75 y.o. [name] [name], [name] [name] Primary Care Ouita Nish: Theodora Blow, MIKE Other Clinician: Referring Heavenlee Maiorana: Treating [name]/Extender: Joanne Chars, MIKE Weeks in Treatment: 130 Vital Signs Height(in): 67 Pulse(bpm): 76 Weight(lbs): 130 Blood Pressure(mmHg): 109/74 Body Mass Index(BMI): 20 Temperature(F): 98.6 Respiratory Rate(breaths/min): 18 Photos: [1:No Photos Right Hand - Dorsum] [23:No Photos Right Elbow] [N/A:N/A N/A] Wound Location: [1:Gradually Appeared] [23:Pressure Injury]  [N/A:N/A] Wounding Event: [1:Atypical] [23:Pressure Ulcer] [N/A:N/A] Primary Etiology: [1:Anemia, Hypertension, Type II] [23:Anemia, Hypertension, Type II] [N/A:N/A] Comorbid History: [1:Diabetes, Paraplegia 11/15/2017] [23:Diabetes, Paraplegia 05/26/2020] [N/A:N/A] Date Acquired: [1:130] [23:4] [N/A:N/A] Weeks of Treatment: [1:Open] [23:Open] [N/A:N/A] Wound Status: [1:Yes] [23:No] [N/A:N/A] Clustered Wound: [1:4] [23:N/A] [N/A:N/A] Clustered Quantity: [1:4x4.5x0.1] [23:3.2x3.6x0.4] [N/A:N/A] Measurements L x W x D (cm) [1:14.137] [23:9.048] [N/A:N/A] A (cm) : rea [1:1.414] [47:6.546] [N/A:N/A] Volume (cm) : [1:55.70%] [23:-16.40%] [N/A:N/A] % Reduction in Area: [1:55.70%] [23:53.50%] [N/A:N/A] % Reduction in Volume: [1:Full Thickness Without Exposed] [23:Category/Stage IV] [N/A:N/A] Classification: [1:Support Structures Small] [23:Medium] [N/A:N/A] Exudate Amount: [1:Serosanguineous] [  23:Serosanguineous] [N/A:N/A] Exudate Type: [1:red, brown] [23:red, brown] [N/A:N/A] Exudate Color: [1:Distinct, outline attached] [23:Well defined, not attached] [N/A:N/A] Wound Margin: [1:Large (67-100%)] [23:Large (67-100%)] [N/A:N/A] Granulation Amount: [1:Red, Pink] [23:Red, Hyper-granulation] [N/A:N/A] Granulation Quality: [1:Small (1-33%)] [23:Small (1-33%)] [N/A:N/A] Necrotic Amount: [1:Fat Layer (Subcutaneous Tissue): Yes Fat Layer (Subcutaneous Tissue): Yes N/A] Exposed Structures: [1:Fascia: No Tendon: No Muscle: No Joint: No Bone: No Large (67-100%)] [23:Tendon: Yes Fascia: No Muscle: No Joint: No Bone: No None] [N/A:N/A] Epithelialization: [1:N/A] [23:Debridement - Excisional] [N/A:N/A] Debridement: [1:N/A] [23:11:15] [N/A:N/A] Pre-procedure Verification/Time Out Taken: [1:N/A] [23:Lidocaine 4% T opical Solution] [N/A:N/A] Pain Control: [1:N/A] [23:Subcutaneous] [N/A:N/A] Tissue Debrided: [1:N/A] [23:Skin/Subcutaneous Tissue] [N/A:N/A] Level: [1:N/A] [23:11.52] [N/A:N/A] Debridement A (sq  cm): [1:rea N/A] [23:Blade] [N/A:N/A] Instrument: [1:N/A] [23:Moderate] [N/A:N/A] Bleeding: [1:N/A] [23:Silver Nitrate] [N/A:N/A] Hemostasis A chieved: [1:N/A] [23:0] [N/A:N/A] Procedural Pain: [1:N/A] [23:0] [N/A:N/A] Post Procedural Pain: [1:N/A] [23:Procedure was tolerated well] [N/A:N/A] Debridement Treatment Response: [1:N/A] [23:3.2x3.6x0.4] [N/A:N/A] Post Debridement Measurements L x W x D (cm) [1:N/A] [94:7.654] [N/A:N/A] Post Debridement Volume: (cm) [1:N/A] [23:Category/Stage IV] [N/A:N/A] Post Debridement Stage: [1:N/A] [23:Debridement] [N/A:N/A] Treatment Notes Wound #1 (Hand - Dorsum) Wound Laterality: Right Cleanser Peri-Wound Care Topical Silvadene Cream Discharge Instruction: mix with triamcinolone cream to right hand. Triamcinolone Discharge Instruction: Apply Triamcinolone as directed Primary Dressing Secondary Dressing Secured With Elastic Bandage 4 inch (ACE bandage) Discharge Instruction: Secure with ACE bandage to above elbow Kerlix Roll Sterile, 4.5x3.1 (in/yd) Discharge Instruction: Secure with Kerlix as directed. Compression Wrap Compression Stockings Add-Ons Wound #23 (Elbow) Wound Laterality: Right Cleanser Peri-Wound Care Topical Primary Dressing KerraCel Ag Gelling Fiber Dressing, 2x2 in (silver alginate) Discharge Instruction: Apply silver alginate to wound bed as instructed Secondary Dressing Woven Gauze Sponge, Non-Sterile 4x4 in Discharge Instruction: Apply over primary dressing as directed. Secured With Elastic Bandage 4 inch (ACE bandage) Discharge Instruction: Secure with ACE bandage as directed. Kerlix Roll Sterile, 4.5x3.1 (in/yd) Discharge Instruction: Secure with Kerlix as directed. Compression Wrap Compression Stockings Add-Ons Electronic Signature(s) Signed: 06/28/2020 4:29:11 PM By: Linton Ham MD Signed: 06/29/2020 6:22:58 PM By: Rhae Hammock RN Entered By: Linton Ham on 06/28/2020  12:41:32 -------------------------------------------------------------------------------- Multi-Disciplinary Care Plan Details Patient Name: Date of Service: [name] [name], [name] RD R. 06/28/2020 10:45 A M Medical Record Number: [id number] Patient Account Number: [id number] Date of Birth/Sex: Treating RN: 03-21-1945 (75 y.o. [name] [name], [name] [name] Primary Care Anglia Blakley: Theodora Blow, MIKE Other Clinician: Referring Temisha Murley: Treating Nyah Shepherd/Extender: Joanne Chars, MIKE Weeks in Treatment: [place] reviewed with physician Active Inactive Pain, Acute or Chronic Nursing Diagnoses: Pain, acute or chronic: actual or potential Potential alteration in comfort, pain Goals: Patient will verbalize adequate pain control and receive pain control interventions during procedures as needed Date Initiated: 06/09/2020 Target Resolution Date: 07/20/2020 Goal Status: Active Patient/caregiver will verbalize comfort level met Date Initiated: 06/09/2020 Target Resolution Date: 07/14/2020 Goal Status: Active Interventions: Encourage patient to take pain medications as prescribed Provide education on pain management Reposition patient for comfort Treatment Activities: Administer pain control measures as ordered : 06/09/2020 Notes: Soft Tissue Infection Nursing Diagnoses: Impaired tissue integrity Knowledge deficit related to disease process and management Goals: Patient/caregiver will verbalize understanding of or measures to prevent infection and contamination in the home setting Date Initiated: 06/09/2020 Target Resolution Date: 07/22/2020 Goal Status: Active Patient's soft tissue infection will resolve Date Initiated: 06/09/2020 Target Resolution Date: 07/15/2020 Goal Status: Active Interventions: Assess signs and symptoms of infection every visit Provide education on infection Treatment Activities: Education provided on Infection : 12/25/2019 Systemic antibiotics :  06/09/2020 T ordered outside of clinic : 06/09/2020 est Notes: Electronic Signature(s) Signed: 06/28/2020 6:05:55 PM By: Deon Pilling Entered By: Deon Pilling on 06/28/2020 11:08:21 -------------------------------------------------------------------------------- Pain Assessment Details Patient Name: Date of Service: [name] [name], [name] RD R. 06/28/2020 10:45 A M Medical Record Number: [id number] Patient Account Number: [id number] Date of Birth/Sex: Treating RN: [birth year] (75 y.o. [name] [name] Primary Care Mikita Lesmeister: Theodora Blow, MIKE Other Clinician: Referring Artin Mceuen: Treating Traylen Eckels/Extender: Joanne Chars, MIKE Weeks in Treatment: 130 Active Problems Location of Pain Severity and Description of Pain Patient Has Paino No Site Locations Rate the pain. Current Pain Level: 0 Character of Pain Describe the Pain: Tender Pain Management and Medication Current Pain Management: Electronic Signature(s) Signed: 06/28/2020 6:10:01 PM By: Baruch Gouty RN, BSN Entered By: Baruch Gouty on 06/28/2020 10:55:16 -------------------------------------------------------------------------------- Patient/Caregiver Education Details Patient Name: Date of Service: [name] [name] RD R. 6/14/2022andnbsp10:45 A M Medical Record Number: [id number] Patient Account Number: [id number] Date of Birth/Gender: Treating RN: 28-Jun-1945 (76 y.o. [name] [name] Primary Care Physician: Theodora Blow, MIKE Other Clinician: Referring Physician: Treating Physician/Extender: Joanne Chars, MIKE Weeks in Treatment: 130 Education Assessment Education Provided To: Patient Education Topics Provided Pain: Handouts: A Guide to Pain Control Methods: Explain/Verbal Responses: Reinforcements needed Electronic Signature(s) Signed: 06/28/2020 6:05:55 PM By: Deon Pilling Entered By: Deon Pilling on 06/28/2020  11:08:43 -------------------------------------------------------------------------------- Wound Assessment Details Patient Name: Date of Service: [name] [name], [name] RD R. 06/28/2020 10:45 A M Medical Record Number: [id number] Patient Account Number: [id number] Date of Birth/Sex: Treating RN: 1945/01/17 (75 y.o. [name] [name] Primary Care Jamilia Jacques: Theodora Blow, MIKE Other Clinician: Referring Drusilla Wampole: Treating Eleanora Guinyard/Extender: Joanne Chars, MIKE Weeks in Treatment: 130 Wound Status Wound Number: 1 Primary Etiology: Atypical Wound Location: Right Hand - Dorsum Wound Status: Open Wounding Event: Gradually Appeared Comorbid History: Anemia, Hypertension, Type II Diabetes, Paraplegia Date Acquired: 11/15/2017 Weeks Of Treatment: 130 Clustered Wound: Yes Photos Wound Measurements Length: (cm) 4 Width: (cm) 4.5 Depth: (cm) 0.1 Clustered Quantity: 4 Area: (cm) 14.137 Volume: (cm) 1.414 % Reduction in Area: 55.7% % Reduction in Volume: 55.7% Epithelialization: Large (67-100%) Tunneling: No Undermining: No Wound Description Classification: Full Thickness Without Exposed Support Structures Wound Margin: Distinct, outline attached Exudate Amount: Small Exudate Type: Serosanguineous Exudate Color: red, brown Foul Odor After Cleansing: No Slough/Fibrino No Wound Bed Granulation Amount: Large (67-100%) Exposed Structure Granulation Quality: Red, Pink Fascia Exposed: No Necrotic Amount: Small (1-33%) Fat Layer (Subcutaneous Tissue) Exposed: Yes Necrotic Quality: Adherent Slough Tendon Exposed: No Muscle Exposed: No Joint Exposed: No Bone Exposed: No Treatment Notes Wound #1 (Hand - Dorsum) Wound Laterality: Right Cleanser Peri-Wound Care Topical Silvadene Cream Discharge Instruction: mix with triamcinolone cream to right hand. Triamcinolone Discharge Instruction: Apply Triamcinolone as directed Primary Dressing Secondary Dressing Secured With Elastic  Bandage 4 inch (ACE bandage) Discharge Instruction: Secure with ACE bandage to above elbow Kerlix Roll Sterile, 4.5x3.1 (in/yd) Discharge Instruction: Secure with Kerlix as directed. Compression Wrap Compression Stockings Add-Ons Electronic Signature(s) Signed: 06/28/2020 4:42:14 PM By: Sandre Kitty Signed: 06/28/2020 6:10:01 PM By: Baruch Gouty RN, BSN Entered By: Sandre Kitty on 06/28/2020 16:21:09 -------------------------------------------------------------------------------- Wound Assessment Details Patient Name: Date of Service: [name] [name], [name] RD R. 06/28/2020 10:45 A M Medical Record Number: [id number] Patient Account Number: [id number] Date of Birth/Sex: Treating RN: 1945-05-26 (75 y.o. [name] [name] Primary Care Kastin Cerda: Theodora Blow, MIKE Other Clinician: Referring Sache Sane: Treating Jeaneane Adamec/Extender: Joanne Chars, MIKE Weeks in Treatment: 130 Wound Status Wound Number: 23 Primary Etiology: Pressure Ulcer  Wound Location: Right Elbow Wound Status: Open Wounding Event: Pressure Injury Comorbid History: Anemia, Hypertension, Type II Diabetes, Paraplegia Date Acquired: 05/26/2020 Weeks Of Treatment: 4 Clustered Wound: No Photos Wound Measurements Length: (cm) 3.2 Width: (cm) 3.6 Depth: (cm) 0.4 Area: (cm) 9.048 Volume: (cm) 3.619 % Reduction in Area: -16.4% % Reduction in Volume: 53.5% Epithelialization: None Tunneling: No Undermining: No Wound Description Classification: Category/Stage IV Wound Margin: Well defined, not attached Exudate Amount: Medium Exudate Type: Serosanguineous Exudate Color: red, brown Foul Odor After Cleansing: No Slough/Fibrino Yes Wound Bed Granulation Amount: Large (67-100%) Exposed Structure Granulation Quality: Red, Hyper-granulation Fascia Exposed: No Necrotic Amount: Small (1-33%) Fat Layer (Subcutaneous Tissue) Exposed: Yes Necrotic Quality: Adherent Slough Tendon Exposed: Yes Muscle Exposed:  No Joint Exposed: No Bone Exposed: No Treatment Notes Wound #23 (Elbow) Wound Laterality: Right Cleanser Peri-Wound Care Topical Primary Dressing KerraCel Ag Gelling Fiber Dressing, 2x2 in (silver alginate) Discharge Instruction: Apply silver alginate to wound bed as instructed Secondary Dressing Woven Gauze Sponge, Non-Sterile 4x4 in Discharge Instruction: Apply over primary dressing as directed. Secured With Elastic Bandage 4 inch (ACE bandage) Discharge Instruction: Secure with ACE bandage as directed. Kerlix Roll Sterile, 4.5x3.1 (in/yd) Discharge Instruction: Secure with Kerlix as directed. Compression Wrap Compression Stockings Add-Ons Electronic Signature(s) Signed: 06/28/2020 4:42:14 PM By: Sandre Kitty Signed: 06/28/2020 6:10:01 PM By: Baruch Gouty RN, BSN Entered By: Sandre Kitty on 06/28/2020 16:20:03 -------------------------------------------------------------------------------- Vitals Details Patient Name: Date of Service: [name] [name], [name] RD R. 06/28/2020 10:45 A M Medical Record Number: [id number] Patient Account Number: [id number] Date of Birth/Sex: Treating RN: [birth year] (75 y.o. [name] [name] Primary Care Romario Tith: Theodora Blow, MIKE Other Clinician: Referring Chloee Tena: Treating Reece Mcbroom/Extender: Joanne Chars, MIKE Weeks in Treatment: 130 Vital Signs Time Taken: 10:54 Temperature (F): 98.6 Height (in): 67 Pulse (bpm): 76 Source: Stated Respiratory Rate (breaths/min): 18 Weight (lbs): 130 Blood Pressure (mmHg): 109/74 Source: Stated Reference Range: 80 - 120 mg / dl Body Mass Index (BMI): 20.4 Electronic Signature(s) Signed: 06/28/2020 6:10:01 PM By: Baruch Gouty RN, BSN Entered By: Baruch Gouty on 06/28/2020 10:55:05

## 2020-08-11 ENCOUNTER — Encounter (HOSPITAL_BASED_OUTPATIENT_CLINIC_OR_DEPARTMENT_OTHER): Payer: PRIVATE HEALTH INSURANCE | Admitting: Internal Medicine

## 2020-08-30 ENCOUNTER — Encounter (HOSPITAL_BASED_OUTPATIENT_CLINIC_OR_DEPARTMENT_OTHER): Payer: PRIVATE HEALTH INSURANCE | Admitting: Internal Medicine

## 2020-09-05 ENCOUNTER — Emergency Department (HOSPITAL_COMMUNITY): Payer: Medicare Other

## 2020-09-05 ENCOUNTER — Inpatient Hospital Stay (HOSPITAL_COMMUNITY)
Admission: EM | Admit: 2020-09-05 | Discharge: 2020-09-09 | DRG: 255 | Disposition: A | Discharge status: Home / Self Care | Payer: Medicare Other | Attending: Internal Medicine | Admitting: Internal Medicine

## 2020-09-05 DIAGNOSIS — L97319 Non-pressure chronic ulcer of right ankle with unspecified severity: Secondary | ICD-10-CM | POA: Diagnosis present

## 2020-09-05 DIAGNOSIS — L03113 Cellulitis of right upper limb: Secondary | ICD-10-CM | POA: Diagnosis not present

## 2020-09-05 DIAGNOSIS — D509 Iron deficiency anemia, unspecified: Secondary | ICD-10-CM | POA: Diagnosis present

## 2020-09-05 DIAGNOSIS — F32A Depression, unspecified: Secondary | ICD-10-CM | POA: Diagnosis present

## 2020-09-05 DIAGNOSIS — Z833 Family history of diabetes mellitus: Secondary | ICD-10-CM

## 2020-09-05 DIAGNOSIS — L89152 Pressure ulcer of sacral region, stage 2: Secondary | ICD-10-CM | POA: Diagnosis present

## 2020-09-05 DIAGNOSIS — L89629 Pressure ulcer of left heel, unspecified stage: Secondary | ICD-10-CM | POA: Diagnosis present

## 2020-09-05 DIAGNOSIS — L97519 Non-pressure chronic ulcer of other part of right foot with unspecified severity: Secondary | ICD-10-CM | POA: Diagnosis present

## 2020-09-05 DIAGNOSIS — E876 Hypokalemia: Secondary | ICD-10-CM | POA: Diagnosis present

## 2020-09-05 DIAGNOSIS — E1152 Type 2 diabetes mellitus with diabetic peripheral angiopathy with gangrene: Principal | ICD-10-CM | POA: Diagnosis present

## 2020-09-05 DIAGNOSIS — S61401A Unspecified open wound of right hand, initial encounter: Secondary | ICD-10-CM | POA: Diagnosis present

## 2020-09-05 DIAGNOSIS — T07XXXA Unspecified multiple injuries, initial encounter: Secondary | ICD-10-CM | POA: Diagnosis present

## 2020-09-05 DIAGNOSIS — E11621 Type 2 diabetes mellitus with foot ulcer: Secondary | ICD-10-CM | POA: Diagnosis present

## 2020-09-05 DIAGNOSIS — L89012 Pressure ulcer of right elbow, stage 2: Secondary | ICD-10-CM | POA: Diagnosis present

## 2020-09-05 DIAGNOSIS — E440 Moderate protein-calorie malnutrition: Secondary | ICD-10-CM | POA: Diagnosis present

## 2020-09-05 DIAGNOSIS — E119 Type 2 diabetes mellitus without complications: Secondary | ICD-10-CM

## 2020-09-05 DIAGNOSIS — Z23 Encounter for immunization: Secondary | ICD-10-CM

## 2020-09-05 DIAGNOSIS — I96 Gangrene, not elsewhere classified: Secondary | ICD-10-CM | POA: Diagnosis present

## 2020-09-05 DIAGNOSIS — E785 Hyperlipidemia, unspecified: Secondary | ICD-10-CM | POA: Diagnosis present

## 2020-09-05 DIAGNOSIS — R532 Functional quadriplegia: Secondary | ICD-10-CM | POA: Diagnosis present

## 2020-09-05 DIAGNOSIS — I10 Essential (primary) hypertension: Secondary | ICD-10-CM | POA: Diagnosis present

## 2020-09-05 DIAGNOSIS — Z7984 Long term (current) use of oral hypoglycemic drugs: Secondary | ICD-10-CM

## 2020-09-05 DIAGNOSIS — Z87891 Personal history of nicotine dependence: Secondary | ICD-10-CM

## 2020-09-05 DIAGNOSIS — Z20822 Contact with and (suspected) exposure to covid-19: Secondary | ICD-10-CM | POA: Diagnosis present

## 2020-09-05 DIAGNOSIS — Z7401 Bed confinement status: Secondary | ICD-10-CM

## 2020-09-05 DIAGNOSIS — D638 Anemia in other chronic diseases classified elsewhere: Secondary | ICD-10-CM | POA: Diagnosis present

## 2020-09-05 LAB — RESP PANEL BY RT-PCR (FLU A&B, COVID) ARPGX2
Influenza A by PCR: NEGATIVE
Influenza B by PCR: NEGATIVE
SARS Coronavirus 2 by RT PCR: NEGATIVE

## 2020-09-05 IMAGING — DX DG HAND COMPLETE 3+V*R*
2 series · 2 of 2 positions shown · non-contrast
Comparison: [DATE]

CLINICAL DATA: Right hand infected wound, chronic right hand
contracture

EXAM:
RIGHT HAND - COMPLETE 3+ VIEW

[hand ap]
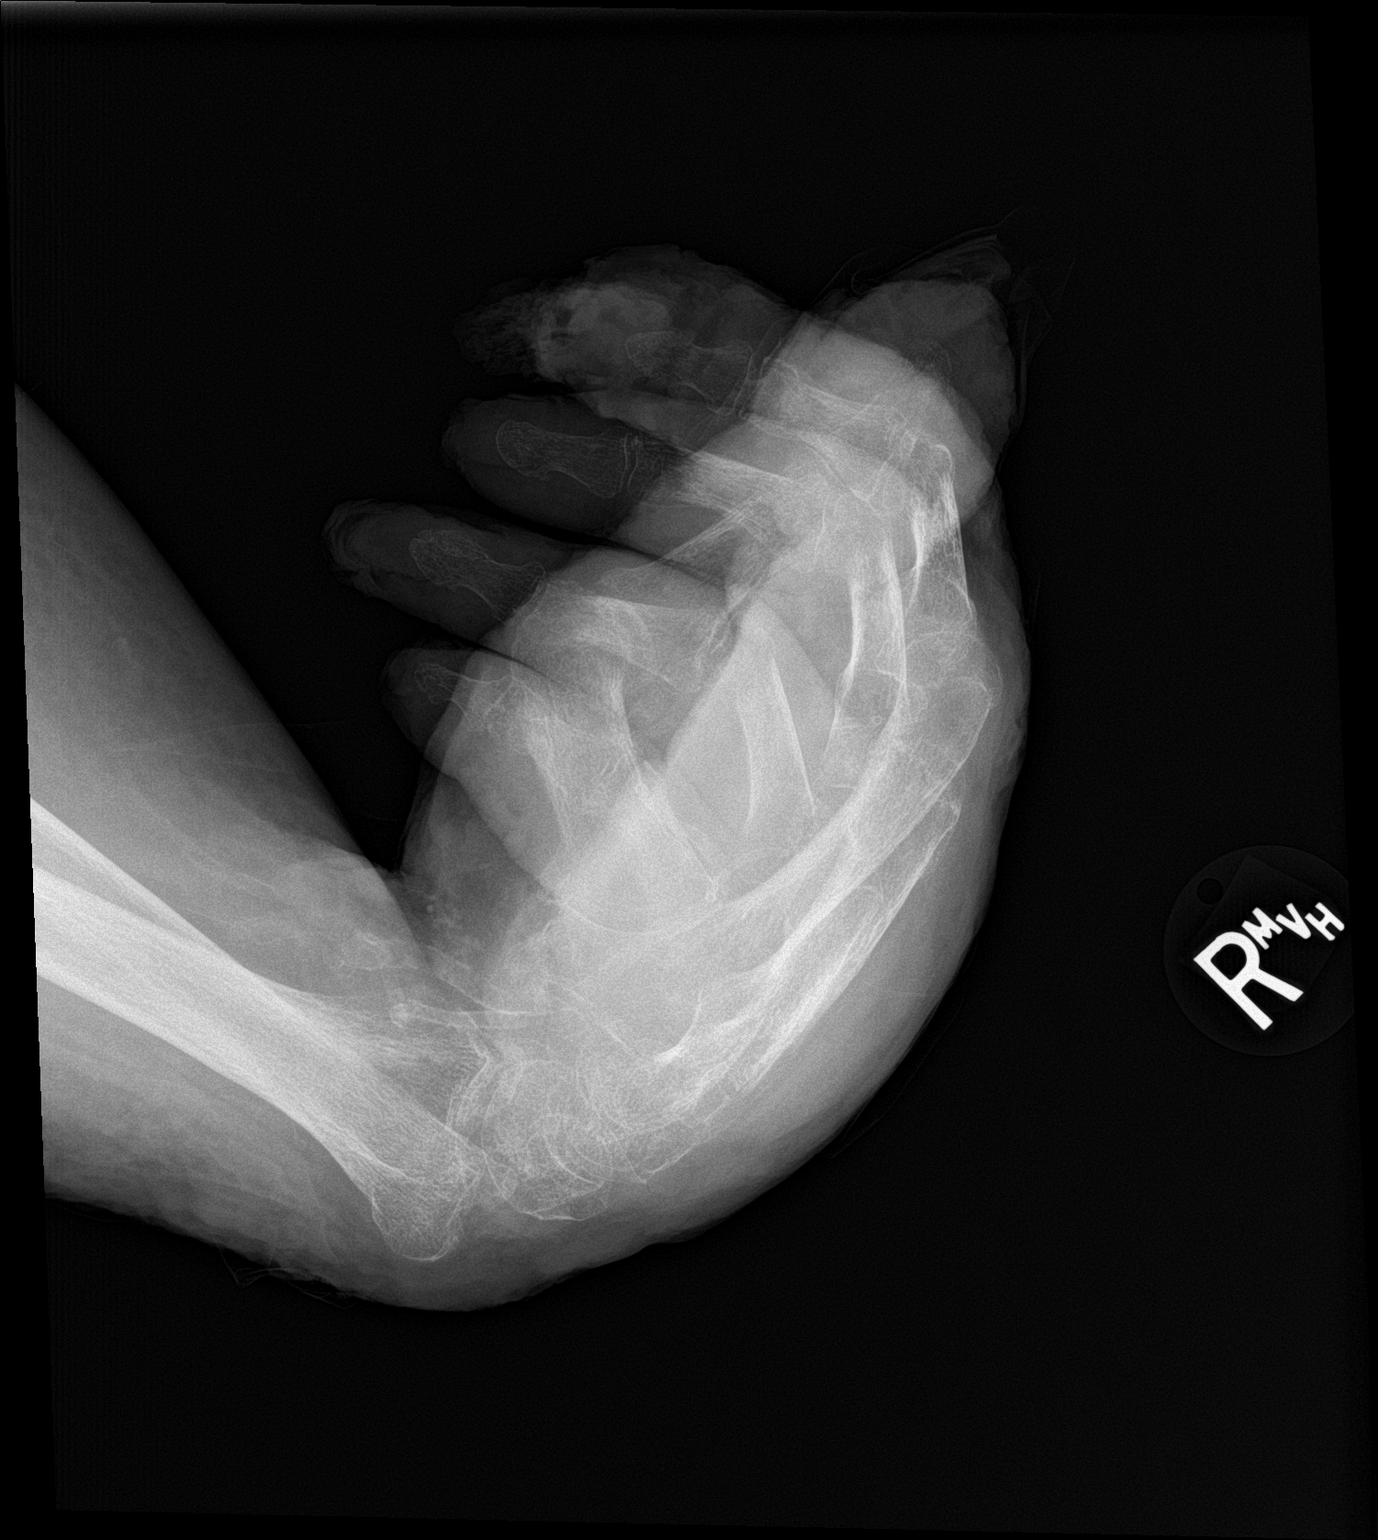

[hand lat]
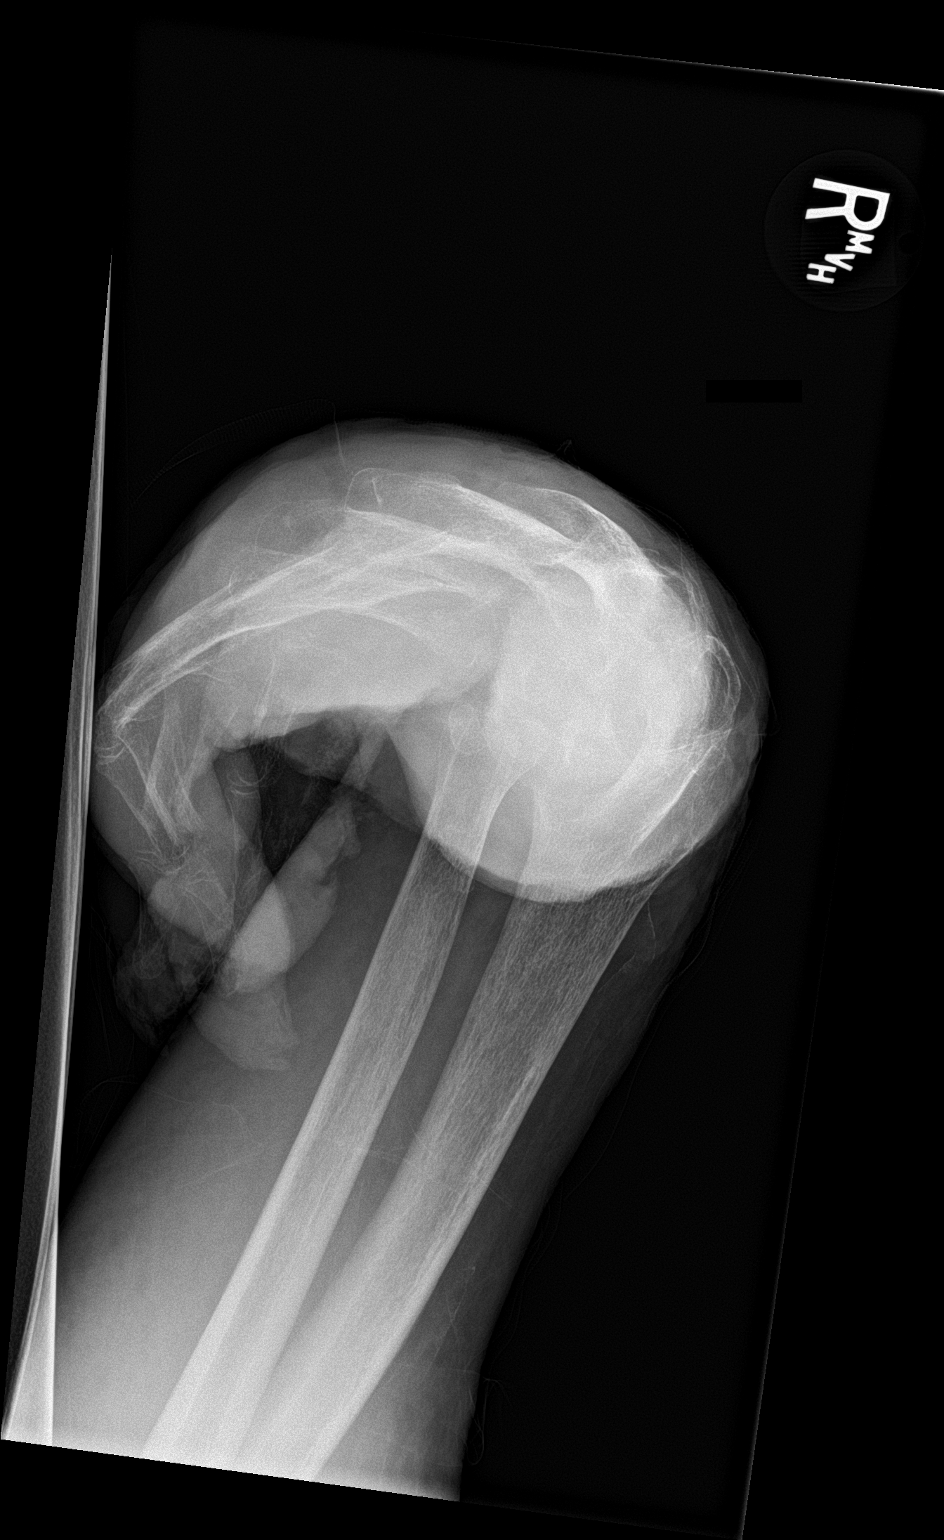

[2 of 2 positions shown; findings below may reference images not displayed]

FINDINGS: Imaging is limited by suboptimal position. Hand is held in extreme
flexion on both presented images. The osseous structures are
diffusely osteopenic. No definite fracture. Alignment is poorly
evaluated due to suboptimal positioning. No definite osseous erosion
identified. Advanced vascular calcifications are seen within the
right wrist and hand. There is marked soft tissue swelling of the
second digit with subcutaneous gas and atrophic and ulcerative
changes involving the distal tip of the second digit. Subcutaneous
gas is also seen involving the distal aspect of the third, fourth,
and fifth digits. There is soft tissue swelling involving the right
hand and visualized right wrist diffusely.
IMPRESSION: Atrophic and ulcerative changes involving the distal aspect of a
markedly swollen second digit in keeping with the given history of
infected wound. Soft tissue changes terminally within the second,
third, fourth, and fifth digits may reflect gangrene. Extensive soft
tissue swelling involving the right hand and wrist may reflect
changes related to cellulitis. No osseous erosion identified.

## 2020-09-05 IMAGING — DX DG FOREARM 2V*R*
3 series · 3 of 3 positions shown · non-contrast
Comparison: None.

CLINICAL DATA: Right hand infection

EXAM:
RIGHT FOREARM - 2 VIEW

[forearm lat (1 of 2)]
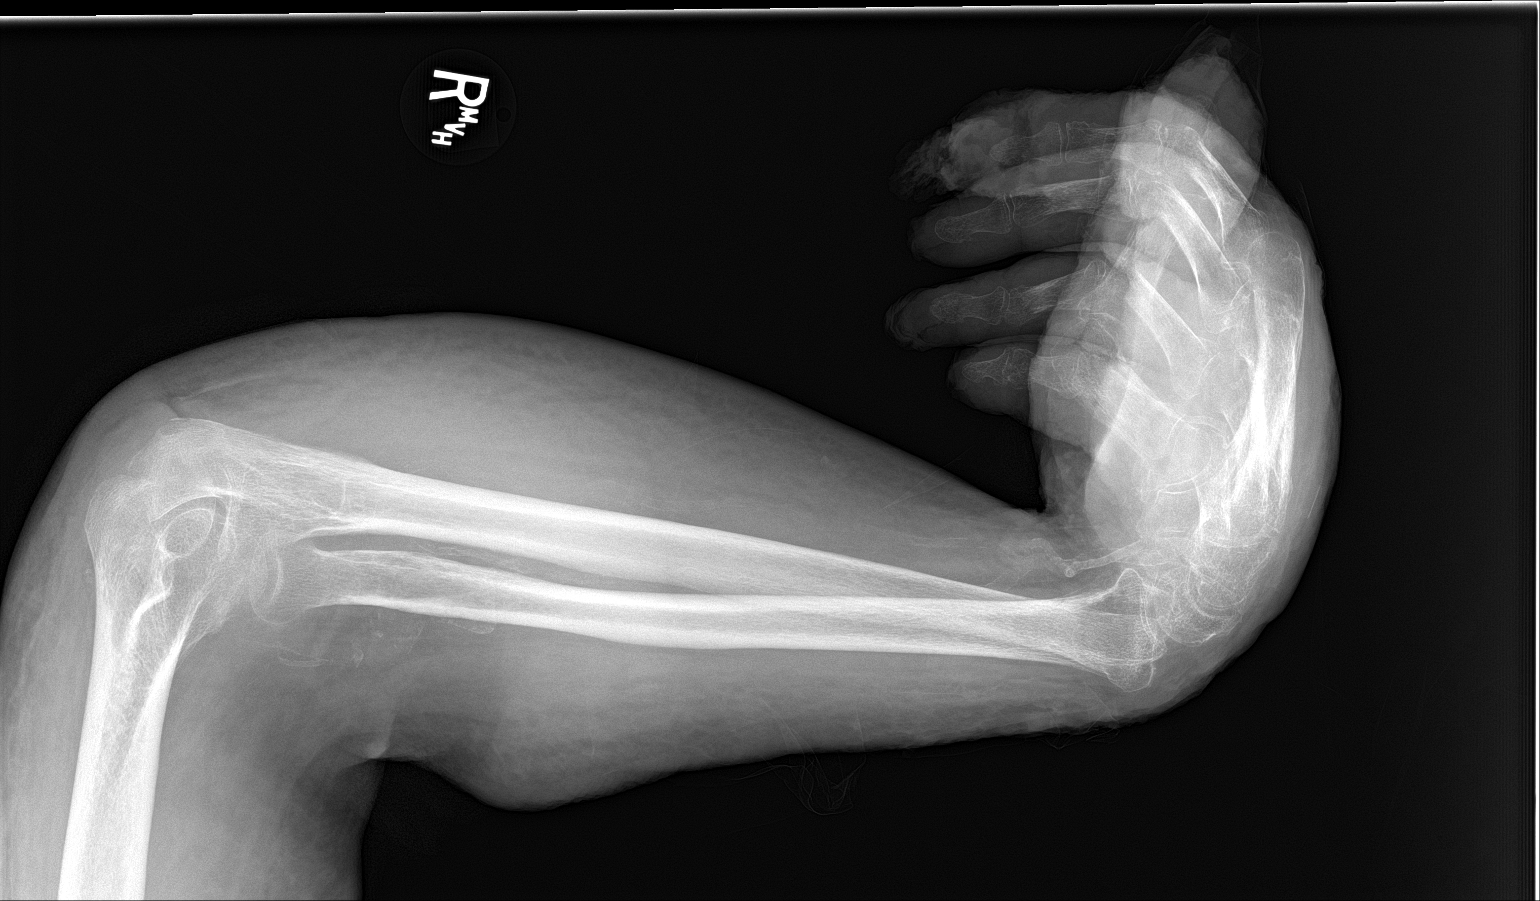

[forearm lat (2 of 2)]
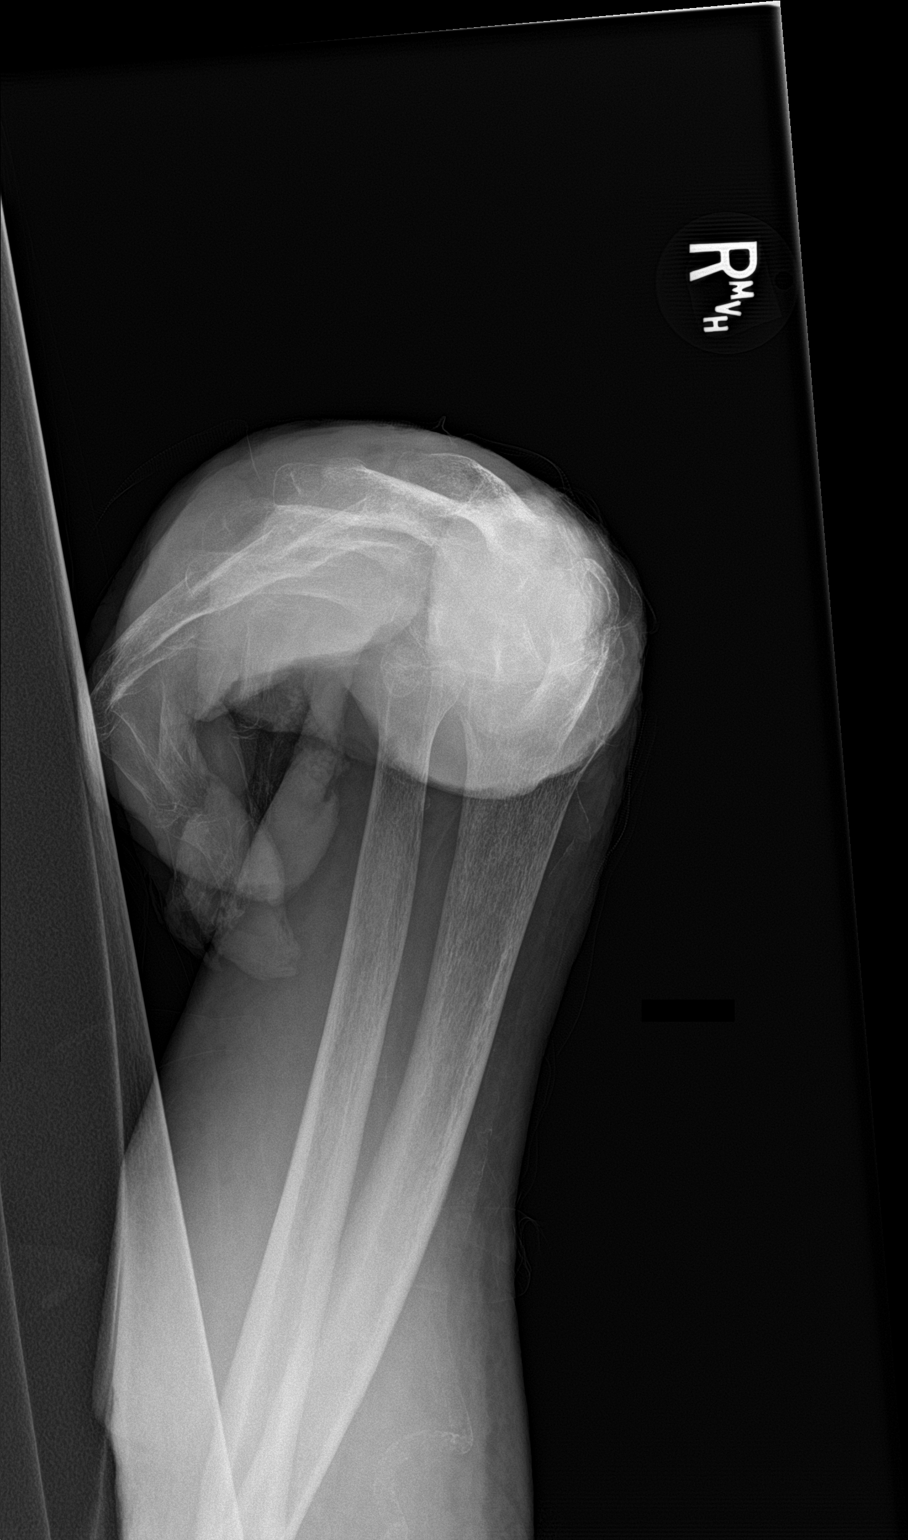

[forearm ap]
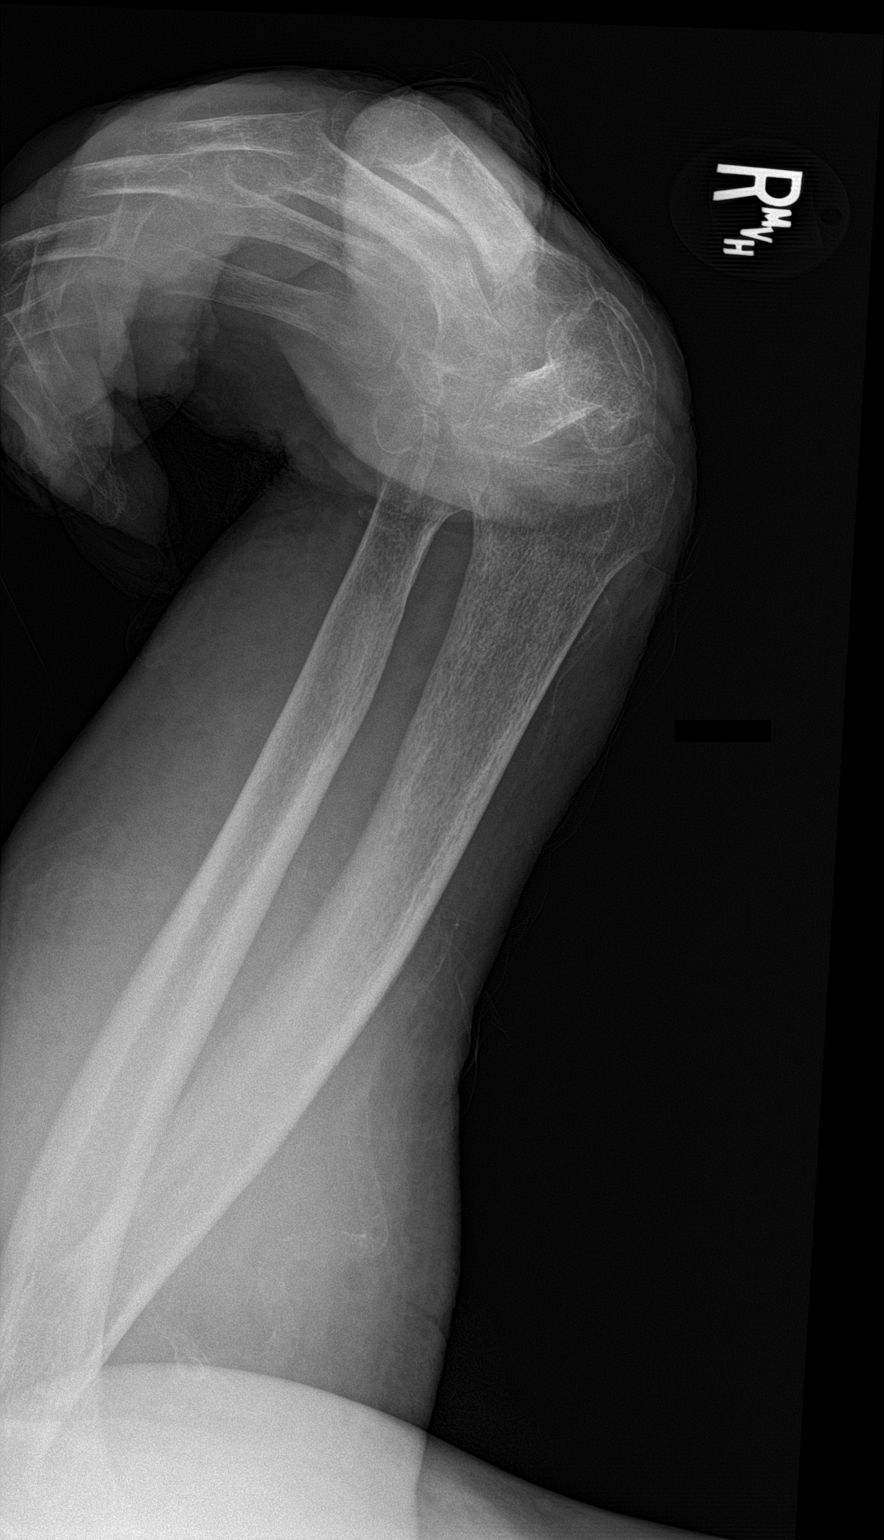

[3 of 3 positions shown; findings below may reference images not displayed]

FINDINGS: The right hand is held in persistent flexion with findings in
keeping with gangrene involving the terminal aspects of the second
through fourth digits, better described on accompanying radiographs
of the right hand. Normal alignment of the a right forearm. No
fracture or dislocation. Advanced vascular calcifications are seen
within the right forearm. There is diffuse subcutaneous edema
involving the right forearm.
IMPRESSION: Diffuse subcutaneous edema.  No fracture or dislocation.

## 2020-09-05 MED ORDER — SODIUM CHLORIDE 0.9 % IV SOLN
2.0000 g | Freq: Once | INTRAVENOUS | Status: AC
  Administered 2020-09-06: 2 g via INTRAVENOUS
  Filled 2020-09-05: qty 2

## 2020-09-05 MED ORDER — VANCOMYCIN HCL IN DEXTROSE 1-5 GM/200ML-% IV SOLN
1000.0000 mg | Freq: Once | INTRAVENOUS | Status: AC
  Administered 2020-09-06: 1000 mg via INTRAVENOUS
  Filled 2020-09-05: qty 200

## 2020-09-05 MED ORDER — LORAZEPAM 2 MG/ML IJ SOLN
2.0000 mg | Freq: Once | INTRAMUSCULAR | Status: AC
  Administered 2020-09-05: 2 mg via INTRAMUSCULAR
  Filled 2020-09-05: qty 1

## 2020-09-05 NOTE — ED Notes (Signed)
This RN unsuccessful with IV access. IV team consult placed.
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
## 2020-09-05 NOTE — Progress Notes (Signed)
A consult was received from an ED physician for vancomycin and cefepime per pharmacy dosing.  The patient's profile has been reviewed for ht/wt/allergies/indication/available labs.    A one time order has been placed for cefepime 2gm and vancomycin 1gm IV x1.  Further antibiotics/pharmacy consults should be ordered by admitting physician if indicated.                       Thank you, Lynelle Doctor 09/05/2020  7:37 PM

## 2020-09-05 NOTE — ED Notes (Signed)
[name] [name] patients daughter would like an update on her father. [phone]
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
## 2020-09-06 ENCOUNTER — Encounter (HOSPITAL_COMMUNITY): Payer: Self-pay | Admitting: Internal Medicine

## 2020-09-06 ENCOUNTER — Inpatient Hospital Stay (HOSPITAL_COMMUNITY): Payer: Medicare Other | Admitting: Certified Registered Nurse Anesthetist

## 2020-09-06 ENCOUNTER — Encounter (HOSPITAL_COMMUNITY)
Admission: EM | Disposition: A | Discharge status: Home / Self Care | Payer: Self-pay | Source: Home / Self Care | Attending: Internal Medicine

## 2020-09-06 ENCOUNTER — Inpatient Hospital Stay: Payer: Self-pay

## 2020-09-06 DIAGNOSIS — E1152 Type 2 diabetes mellitus with diabetic peripheral angiopathy with gangrene: Secondary | ICD-10-CM | POA: Diagnosis not present

## 2020-09-06 DIAGNOSIS — Z87891 Personal history of nicotine dependence: Secondary | ICD-10-CM | POA: Diagnosis not present

## 2020-09-06 DIAGNOSIS — E119 Type 2 diabetes mellitus without complications: Secondary | ICD-10-CM | POA: Diagnosis not present

## 2020-09-06 DIAGNOSIS — D638 Anemia in other chronic diseases classified elsewhere: Secondary | ICD-10-CM | POA: Diagnosis not present

## 2020-09-06 DIAGNOSIS — I10 Essential (primary) hypertension: Secondary | ICD-10-CM | POA: Diagnosis present

## 2020-09-06 DIAGNOSIS — E11621 Type 2 diabetes mellitus with foot ulcer: Secondary | ICD-10-CM | POA: Diagnosis not present

## 2020-09-06 DIAGNOSIS — I96 Gangrene, not elsewhere classified: Secondary | ICD-10-CM

## 2020-09-06 DIAGNOSIS — L89152 Pressure ulcer of sacral region, stage 2: Secondary | ICD-10-CM | POA: Diagnosis not present

## 2020-09-06 DIAGNOSIS — L03113 Cellulitis of right upper limb: Secondary | ICD-10-CM

## 2020-09-06 DIAGNOSIS — Z7984 Long term (current) use of oral hypoglycemic drugs: Secondary | ICD-10-CM | POA: Diagnosis not present

## 2020-09-06 DIAGNOSIS — E876 Hypokalemia: Secondary | ICD-10-CM | POA: Diagnosis present

## 2020-09-06 DIAGNOSIS — E785 Hyperlipidemia, unspecified: Secondary | ICD-10-CM | POA: Diagnosis present

## 2020-09-06 DIAGNOSIS — R532 Functional quadriplegia: Secondary | ICD-10-CM | POA: Diagnosis not present

## 2020-09-06 DIAGNOSIS — Z833 Family history of diabetes mellitus: Secondary | ICD-10-CM | POA: Diagnosis not present

## 2020-09-06 DIAGNOSIS — T07XXXA Unspecified multiple injuries, initial encounter: Secondary | ICD-10-CM

## 2020-09-06 DIAGNOSIS — Z20822 Contact with and (suspected) exposure to covid-19: Secondary | ICD-10-CM | POA: Diagnosis not present

## 2020-09-06 DIAGNOSIS — E440 Moderate protein-calorie malnutrition: Secondary | ICD-10-CM | POA: Diagnosis not present

## 2020-09-06 DIAGNOSIS — L97519 Non-pressure chronic ulcer of other part of right foot with unspecified severity: Secondary | ICD-10-CM | POA: Diagnosis present

## 2020-09-06 DIAGNOSIS — Z23 Encounter for immunization: Secondary | ICD-10-CM | POA: Diagnosis not present

## 2020-09-06 DIAGNOSIS — F32A Depression, unspecified: Secondary | ICD-10-CM | POA: Diagnosis not present

## 2020-09-06 DIAGNOSIS — L89012 Pressure ulcer of right elbow, stage 2: Secondary | ICD-10-CM | POA: Diagnosis not present

## 2020-09-06 DIAGNOSIS — D508 Other iron deficiency anemias: Secondary | ICD-10-CM

## 2020-09-06 DIAGNOSIS — Z7401 Bed confinement status: Secondary | ICD-10-CM | POA: Diagnosis not present

## 2020-09-06 DIAGNOSIS — D509 Iron deficiency anemia, unspecified: Secondary | ICD-10-CM | POA: Diagnosis not present

## 2020-09-06 HISTORY — PX: INCISION AND DRAINAGE OF WOUND: SHX1803

## 2020-09-06 LAB — URINALYSIS, ROUTINE W REFLEX MICROSCOPIC
Bilirubin Urine: NEGATIVE
Glucose, UA: NEGATIVE mg/dL
Ketones, ur: 5 mg/dL — AB
Leukocytes,Ua: NEGATIVE
Nitrite: NEGATIVE
Protein, ur: NEGATIVE mg/dL
Specific Gravity, Urine: 1.009 (ref 1.005–1.030)
pH: 6 (ref 5.0–8.0)

## 2020-09-06 LAB — CBG MONITORING, ED: Glucose-Capillary: 135 mg/dL — ABNORMAL HIGH (ref 70–99)

## 2020-09-06 LAB — BASIC METABOLIC PANEL
Anion gap: 11 (ref 5–15)
BUN: 24 mg/dL — ABNORMAL HIGH (ref 8–23)
CO2: 25 mmol/L (ref 22–32)
Calcium: 8.4 mg/dL — ABNORMAL LOW (ref 8.9–10.3)
Chloride: 105 mmol/L (ref 98–111)
Creatinine, Ser: 0.71 mg/dL (ref 0.61–1.24)
GFR, Estimated: >60 mL/min (ref >60)
Glucose, Bld: 118 mg/dL — ABNORMAL HIGH (ref 70–99)
Potassium: 3 mmol/L — ABNORMAL LOW (ref 3.5–5.1)
Sodium: 141 mmol/L (ref 135–145)

## 2020-09-06 LAB — CBC WITH DIFFERENTIAL/PLATELET
Abs Immature Granulocytes: 0.02 10*3/uL (ref 0.00–0.07)
Basophils Absolute: 0 10*3/uL (ref 0.0–0.1)
Basophils Relative: 0 %
Eosinophils Absolute: 0.1 10*3/uL (ref 0.0–0.5)
Eosinophils Relative: 1 %
HCT: 33.4 % — ABNORMAL LOW (ref 39.0–52.0)
Hemoglobin: 10.9 g/dL — ABNORMAL LOW (ref 13.0–17.0)
Immature Granulocytes: 0 %
Lymphocytes Relative: 26 %
Lymphs Abs: 1.8 10*3/uL (ref 0.7–4.0)
MCH: 31.1 pg (ref 26.0–34.0)
MCHC: 32.6 g/dL (ref 30.0–36.0)
MCV: 95.4 fL (ref 80.0–100.0)
Monocytes Absolute: 0.6 10*3/uL (ref 0.1–1.0)
Monocytes Relative: 9 %
Neutro Abs: 4.5 10*3/uL (ref 1.7–7.7)
Neutrophils Relative %: 64 %
Platelets: 241 10*3/uL (ref 150–400)
RBC: 3.5 MIL/uL — ABNORMAL LOW (ref 4.22–5.81)
RDW: 14.8 % (ref 11.5–15.5)
WBC: 7 10*3/uL (ref 4.0–10.5)
nRBC: 0 % (ref 0.0–0.2)

## 2020-09-06 LAB — FOLATE: Folate: 9.2 ng/mL (ref >5.9)

## 2020-09-06 LAB — GLUCOSE, CAPILLARY
Glucose-Capillary: 102 mg/dL — ABNORMAL HIGH (ref 70–99)
Glucose-Capillary: 105 mg/dL — ABNORMAL HIGH (ref 70–99)

## 2020-09-06 LAB — COMPREHENSIVE METABOLIC PANEL
ALT: 7 U/L (ref 0–44)
AST: 13 U/L — ABNORMAL LOW (ref 15–41)
Albumin: 2.8 g/dL — ABNORMAL LOW (ref 3.5–5.0)
Alkaline Phosphatase: 89 U/L (ref 38–126)
Anion gap: 10 (ref 5–15)
BUN: 30 mg/dL — ABNORMAL HIGH (ref 8–23)
CO2: 25 mmol/L (ref 22–32)
Calcium: 8.3 mg/dL — ABNORMAL LOW (ref 8.9–10.3)
Chloride: 106 mmol/L (ref 98–111)
Creatinine, Ser: 0.94 mg/dL (ref 0.61–1.24)
GFR, Estimated: >60 mL/min (ref >60)
Glucose, Bld: 140 mg/dL — ABNORMAL HIGH (ref 70–99)
Potassium: 2.9 mmol/L — ABNORMAL LOW (ref 3.5–5.1)
Sodium: 141 mmol/L (ref 135–145)
Total Bilirubin: 0.9 mg/dL (ref 0.3–1.2)
Total Protein: 6.6 g/dL (ref 6.5–8.1)

## 2020-09-06 LAB — LACTIC ACID, PLASMA: Lactic Acid, Venous: 1.4 mmol/L (ref 0.5–1.9)

## 2020-09-06 LAB — TYPE AND SCREEN
ABO/RH(D): O POS
Antibody Screen: NEGATIVE

## 2020-09-06 LAB — MAGNESIUM
Magnesium: 1.9 mg/dL (ref 1.7–2.4)
Magnesium: 1.9 mg/dL (ref 1.7–2.4)

## 2020-09-06 LAB — IRON AND TIBC
Iron: 44 ug/dL — ABNORMAL LOW (ref 45–182)
Saturation Ratios: 16 % — ABNORMAL LOW (ref 17.9–39.5)
TIBC: 282 ug/dL (ref 250–450)
UIBC: 238 ug/dL

## 2020-09-06 LAB — HEMOGLOBIN A1C
Hgb A1c MFr Bld: 7.5 % — ABNORMAL HIGH (ref 4.8–5.6)
Mean Plasma Glucose: 168.55 mg/dL

## 2020-09-06 LAB — VITAMIN B12: Vitamin B-12: 292 pg/mL (ref 180–914)

## 2020-09-06 LAB — PROTIME-INR
INR: 1.1 (ref 0.8–1.2)
Prothrombin Time: 14.4 seconds (ref 11.4–15.2)

## 2020-09-06 LAB — PHOSPHORUS: Phosphorus: 2.2 mg/dL — ABNORMAL LOW (ref 2.5–4.6)

## 2020-09-06 LAB — FERRITIN: Ferritin: 112 ng/mL (ref 24–336)

## 2020-09-06 SURGERY — IRRIGATION AND DEBRIDEMENT WOUND
Anesthesia: General | Site: Hand | Laterality: Right

## 2020-09-06 MED ORDER — ACETAMINOPHEN 325 MG PO TABS
650.0000 mg | ORAL_TABLET | Freq: Four times a day (QID) | ORAL | Status: DC | PRN
  Administered 2020-09-09: 650 mg via ORAL
  Filled 2020-09-06: qty 2

## 2020-09-06 MED ORDER — FENTANYL CITRATE (PF) 100 MCG/2ML IJ SOLN: 25.0000 ug | INTRAMUSCULAR | Status: DC | PRN

## 2020-09-06 MED ORDER — CHLORHEXIDINE GLUCONATE 0.12 % MT SOLN: 15.0000 mL | Freq: Once | OROMUCOSAL | Status: DC

## 2020-09-06 MED ORDER — BUPIVACAINE HCL (PF) 0.5 % IJ SOLN
INTRAMUSCULAR | Status: AC
  Filled 2020-09-06: qty 30

## 2020-09-06 MED ORDER — ONDANSETRON HCL 4 MG PO TABS: 4.0000 mg | ORAL_TABLET | Freq: Four times a day (QID) | ORAL | Status: DC | PRN

## 2020-09-06 MED ORDER — METRONIDAZOLE 500 MG/100ML IV SOLN
500.0000 mg | Freq: Two times a day (BID) | INTRAVENOUS | Status: DC
  Administered 2020-09-06 – 2020-09-08 (×4): 500 mg via INTRAVENOUS
  Filled 2020-09-06 (×4): qty 100

## 2020-09-06 MED ORDER — POTASSIUM CHLORIDE 10 MEQ/100ML IV SOLN
10.0000 meq | INTRAVENOUS | Status: DC
  Administered 2020-09-06 (×4): 10 meq via INTRAVENOUS
  Filled 2020-09-06 (×4): qty 100

## 2020-09-06 MED ORDER — ACETAMINOPHEN 650 MG RE SUPP: 650.0000 mg | Freq: Four times a day (QID) | RECTAL | Status: DC | PRN

## 2020-09-06 MED ORDER — PROPOFOL 10 MG/ML IV BOLUS
INTRAVENOUS | Status: DC | PRN
  Administered 2020-09-06: 100 mg via INTRAVENOUS

## 2020-09-06 MED ORDER — METOPROLOL TARTRATE 5 MG/5ML IV SOLN: 5.0000 mg | Freq: Four times a day (QID) | INTRAVENOUS | Status: DC | PRN

## 2020-09-06 MED ORDER — LOSARTAN POTASSIUM 50 MG PO TABS
100.0000 mg | ORAL_TABLET | Freq: Every day | ORAL | Status: DC
  Administered 2020-09-07: 100 mg via ORAL
  Filled 2020-09-06: qty 2

## 2020-09-06 MED ORDER — BISACODYL 10 MG RE SUPP: 10.0000 mg | Freq: Every day | RECTAL | Status: DC | PRN

## 2020-09-06 MED ORDER — PHENYLEPHRINE HCL (PRESSORS) 10 MG/ML IV SOLN
INTRAVENOUS | Status: AC
  Filled 2020-09-06: qty 2

## 2020-09-06 MED ORDER — EPHEDRINE SULFATE-NACL 50-0.9 MG/10ML-% IV SOSY
PREFILLED_SYRINGE | INTRAVENOUS | Status: DC | PRN
  Administered 2020-09-06: 10 mg via INTRAVENOUS
  Administered 2020-09-06: 5 mg via INTRAVENOUS

## 2020-09-06 MED ORDER — SODIUM CHLORIDE 0.9 % IV SOLN
2.0000 g | Freq: Three times a day (TID) | INTRAVENOUS | Status: DC
  Administered 2020-09-06 – 2020-09-08 (×5): 2 g via INTRAVENOUS
  Filled 2020-09-06 (×7): qty 2

## 2020-09-06 MED ORDER — ONDANSETRON HCL 4 MG/2ML IJ SOLN: 4.0000 mg | Freq: Four times a day (QID) | INTRAMUSCULAR | Status: DC | PRN

## 2020-09-06 MED ORDER — IPRATROPIUM BROMIDE 0.02 % IN SOLN: 0.5000 mg | RESPIRATORY_TRACT | Status: DC | PRN

## 2020-09-06 MED ORDER — FENTANYL CITRATE (PF) 100 MCG/2ML IJ SOLN
INTRAMUSCULAR | Status: AC
  Filled 2020-09-06: qty 2

## 2020-09-06 MED ORDER — ONDANSETRON HCL 4 MG/2ML IJ SOLN: 4.0000 mg | Freq: Once | INTRAMUSCULAR | Status: DC | PRN

## 2020-09-06 MED ORDER — SENNOSIDES-DOCUSATE SODIUM 8.6-50 MG PO TABS: 1.0000 | ORAL_TABLET | Freq: Every evening | ORAL | Status: DC | PRN

## 2020-09-06 MED ORDER — INSULIN ASPART 100 UNIT/ML IJ SOLN
0.0000 [IU] | Freq: Four times a day (QID) | INTRAMUSCULAR | Status: DC
  Administered 2020-09-07: 1 [IU] via SUBCUTANEOUS
  Filled 2020-09-06: qty 0.06

## 2020-09-06 MED ORDER — SODIUM CHLORIDE 0.9% FLUSH: 10.0000 mL | INTRAVENOUS | Status: DC | PRN

## 2020-09-06 MED ORDER — MAGNESIUM SULFATE 4 GM/100ML IV SOLN
4.0000 g | Freq: Once | INTRAVENOUS | Status: AC
  Administered 2020-09-06: 4 g via INTRAVENOUS
  Filled 2020-09-06: qty 100

## 2020-09-06 MED ORDER — PHENYLEPHRINE HCL-NACL 20-0.9 MG/250ML-% IV SOLN
INTRAVENOUS | Status: DC | PRN
  Administered 2020-09-06: 50 ug/min via INTRAVENOUS

## 2020-09-06 MED ORDER — SODIUM CHLORIDE 0.9 % IV SOLN: INTRAVENOUS | Status: DC

## 2020-09-06 MED ORDER — VANCOMYCIN HCL 1500 MG/300ML IV SOLN
1500.0000 mg | INTRAVENOUS | Status: DC
  Administered 2020-09-06 – 2020-09-07 (×3): 1500 mg via INTRAVENOUS
  Filled 2020-09-06 (×2): qty 300

## 2020-09-06 MED ORDER — POVIDONE-IODINE 10 % EX SWAB: 2.0000 "application " | Freq: Once | CUTANEOUS | Status: DC

## 2020-09-06 MED ORDER — ONDANSETRON HCL 4 MG/2ML IJ SOLN
INTRAMUSCULAR | Status: DC | PRN
  Administered 2020-09-06: 4 mg via INTRAVENOUS

## 2020-09-06 MED ORDER — PHENYLEPHRINE 40 MCG/ML (10ML) SYRINGE FOR IV PUSH (FOR BLOOD PRESSURE SUPPORT)
PREFILLED_SYRINGE | INTRAVENOUS | Status: DC | PRN
  Administered 2020-09-06 (×3): 200 ug via INTRAVENOUS

## 2020-09-06 MED ORDER — LIDOCAINE HCL (CARDIAC) PF 100 MG/5ML IV SOSY
PREFILLED_SYRINGE | INTRAVENOUS | Status: DC | PRN
  Administered 2020-09-06: 60 mg via INTRAVENOUS

## 2020-09-06 MED ORDER — FENTANYL CITRATE (PF) 100 MCG/2ML IJ SOLN
INTRAMUSCULAR | Status: DC | PRN
  Administered 2020-09-06 (×2): 50 ug via INTRAVENOUS

## 2020-09-06 MED ORDER — CHLORHEXIDINE GLUCONATE 4 % EX LIQD: 60.0000 mL | Freq: Once | CUTANEOUS | Status: DC

## 2020-09-06 MED ORDER — PRAVASTATIN SODIUM 20 MG PO TABS
20.0000 mg | ORAL_TABLET | Freq: Every day | ORAL | Status: DC
  Administered 2020-09-07 – 2020-09-09 (×3): 20 mg via ORAL
  Filled 2020-09-06 (×3): qty 1

## 2020-09-06 MED ORDER — CEFAZOLIN SODIUM-DEXTROSE 2-4 GM/100ML-% IV SOLN: 2.0000 g | INTRAVENOUS | Status: DC

## 2020-09-06 MED ORDER — MORPHINE SULFATE (PF) 2 MG/ML IV SOLN
1.0000 mg | INTRAVENOUS | Status: DC | PRN
  Administered 2020-09-08 – 2020-09-09 (×2): 2 mg via INTRAVENOUS
  Filled 2020-09-06 (×2): qty 1

## 2020-09-06 MED ORDER — ENOXAPARIN SODIUM 40 MG/0.4ML IJ SOSY
40.0000 mg | PREFILLED_SYRINGE | INTRAMUSCULAR | Status: DC
  Administered 2020-09-07 – 2020-09-09 (×3): 40 mg via SUBCUTANEOUS
  Filled 2020-09-06 (×3): qty 0.4

## 2020-09-06 MED ORDER — 0.9 % SODIUM CHLORIDE (POUR BTL) OPTIME
TOPICAL | Status: DC | PRN
  Administered 2020-09-06: 1000 mL

## 2020-09-06 MED ORDER — OXYCODONE HCL 5 MG PO TABS: 5.0000 mg | ORAL_TABLET | Freq: Once | ORAL | Status: DC | PRN

## 2020-09-06 MED ORDER — BUPIVACAINE HCL (PF) 0.5 % IJ SOLN
INTRAMUSCULAR | Status: DC | PRN
  Administered 2020-09-06: 30 mL

## 2020-09-06 MED ORDER — ALBUTEROL SULFATE (2.5 MG/3ML) 0.083% IN NEBU: 2.5000 mg | INHALATION_SOLUTION | RESPIRATORY_TRACT | Status: DC | PRN

## 2020-09-06 MED ORDER — SODIUM CHLORIDE 0.9 % IV SOLN
2.0000 g | INTRAVENOUS | Status: AC
  Administered 2020-09-06: 2 g via INTRAVENOUS
  Filled 2020-09-06: qty 2

## 2020-09-06 MED ORDER — POTASSIUM CHLORIDE CRYS ER 20 MEQ PO TBCR
40.0000 meq | EXTENDED_RELEASE_TABLET | ORAL | Status: AC
  Administered 2020-09-06 – 2020-09-07 (×2): 40 meq via ORAL
  Filled 2020-09-06 (×2): qty 2

## 2020-09-06 MED ORDER — OXYCODONE HCL 5 MG PO TABS
5.0000 mg | ORAL_TABLET | ORAL | Status: DC | PRN
Start: 2020-09-06 — End: 2020-09-10
  Administered 2020-09-09: 5 mg via ORAL
  Filled 2020-09-06: qty 1

## 2020-09-06 MED ORDER — OXYCODONE HCL 5 MG/5ML PO SOLN: 5.0000 mg | Freq: Once | ORAL | Status: DC | PRN

## 2020-09-06 MED ORDER — CHLORHEXIDINE GLUCONATE CLOTH 2 % EX PADS
6.0000 | MEDICATED_PAD | Freq: Every day | CUTANEOUS | Status: DC
  Administered 2020-09-07 – 2020-09-09 (×3): 6 via TOPICAL

## 2020-09-06 MED ORDER — TETANUS-DIPHTH-ACELL PERTUSSIS 5-2.5-18.5 LF-MCG/0.5 IM SUSY
0.5000 mL | PREFILLED_SYRINGE | Freq: Once | INTRAMUSCULAR | Status: AC
  Administered 2020-09-06: 0.5 mL via INTRAMUSCULAR
  Filled 2020-09-06: qty 0.5

## 2020-09-06 MED ORDER — LACTATED RINGERS IV SOLN: INTRAVENOUS | Status: DC

## 2020-09-06 MED ORDER — SODIUM CHLORIDE 0.9% FLUSH
10.0000 mL | Freq: Two times a day (BID) | INTRAVENOUS | Status: DC
  Administered 2020-09-06: 10 mL
  Administered 2020-09-07: 20 mL
  Administered 2020-09-07 – 2020-09-08 (×2): 10 mL
  Administered 2020-09-08: 20 mL
  Administered 2020-09-09: 10 mL

## 2020-09-06 SURGICAL SUPPLY — 34 items
BAG ZIPLOCK 12X15 (MISCELLANEOUS) ×2 IMPLANT
BLADE MIC 41X13 (BLADE) ×1 IMPLANT
BLADE SAGITTAL 13X1.27X60 (BLADE) IMPLANT
BNDG ELASTIC 4X5.8 VLCR STR LF (GAUZE/BANDAGES/DRESSINGS) ×1 IMPLANT
BNDG GAUZE ELAST 4 BULKY (GAUZE/BANDAGES/DRESSINGS) ×2 IMPLANT
CORD BIPOLAR FORCEPS 12FT (ELECTRODE) ×2 IMPLANT
COVER SURGICAL LIGHT HANDLE (MISCELLANEOUS) ×2 IMPLANT
CUFF TOURN SGL QUICK 18X4 (TOURNIQUET CUFF) ×2 IMPLANT
DRAPE SURG 17X11 SM STRL (DRAPES) ×4 IMPLANT
DRSG PAD ABDOMINAL 8X10 ST (GAUZE/BANDAGES/DRESSINGS) ×1 IMPLANT
ELECT REM PT RETURN 15FT ADLT (MISCELLANEOUS) ×2 IMPLANT
GAUZE SPONGE 4X4 12PLY STRL (GAUZE/BANDAGES/DRESSINGS) ×1 IMPLANT
GAUZE XEROFORM 5X9 LF (GAUZE/BANDAGES/DRESSINGS) ×1 IMPLANT
GLOVE SURG ENC MOIS LTX SZ8 (GLOVE) ×1 IMPLANT
GLOVE SURG ENC TEXT LTX SZ8 (GLOVE) ×4 IMPLANT
GLOVE SURG LTX SZ8 (GLOVE) ×1 IMPLANT
GLOVE SURG UNDER POLY LF SZ6.5 (GLOVE) ×2 IMPLANT
GLOVE SURG UNDER POLY LF SZ7 (GLOVE) ×2 IMPLANT
GLOVE SURG UNDER POLY LF SZ7.5 (GLOVE) ×3 IMPLANT
GOWN STRL REUS W/ TWL XL LVL3 (GOWN DISPOSABLE) IMPLANT
GOWN STRL REUS W/TWL XL LVL3 (GOWN DISPOSABLE) ×1
KIT BASIN OR (CUSTOM PROCEDURE TRAY) ×2 IMPLANT
KIT TURNOVER KIT A (KITS) ×2 IMPLANT
NEEDLE HYPO 22GX1.5 SAFETY (NEEDLE) ×2 IMPLANT
PACK ORTHO EXTREMITY (CUSTOM PROCEDURE TRAY) ×2 IMPLANT
PENCIL SMOKE EVACUATOR (MISCELLANEOUS) ×1 IMPLANT
PROTECTOR NERVE ULNAR (MISCELLANEOUS) ×2 IMPLANT
STAPLER VISISTAT 35W (STAPLE) ×1 IMPLANT
SUT SILK 3 0 (SUTURE) ×1
SUT SILK 3-0 18XBRD TIE 12 (SUTURE) IMPLANT
SUT VIC AB 3-0 SH 27 (SUTURE) ×2
SUT VIC AB 3-0 SH 27XBRD (SUTURE) IMPLANT
SYR CONTROL 10ML LL (SYRINGE) ×2 IMPLANT
UNDERPAD 30X36 HEAVY ABSORB (UNDERPADS AND DIAPERS) ×2 IMPLANT
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
## 2020-09-06 NOTE — ED Notes (Signed)
Patient was soiled from arrival.  Patient cleaned with soap and water.  New linen and gown placed.  External catheter placed and turned to suction
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
## 2020-09-06 NOTE — Evaluation (Addendum)
SLP Cancellation Note  Patient Details Name: [name] [name] MRN: [id number] DOB: [birth year]   Cancelled treatment:       Reason Eval/Treat Not Completed: Other (comment) (pt npo pending hand surgeon consult, will continue efforts for clinical swallow eval and cognitive linguistic eval as ordered. thank you)   Macario Golds 09/06/2020, 11:36 AM  Kathleen Lime, MS [facility] SLP Acute Rehab Services Office [phone] Pager [phone]

## 2020-09-06 NOTE — Progress Notes (Signed)
Peripherally Inserted Central Catheter Placement  The IV Nurse has discussed with the patient and/or persons authorized to consent for the patient, the purpose of this procedure and the potential benefits and risks involved with this procedure.  The benefits include less needle sticks, lab draws from the catheter, and the patient may be discharged home with the catheter. Risks include, but not limited to, infection, bleeding, blood clot (thrombus formation), and puncture of an artery; nerve damage and irregular heartbeat and possibility to perform a PICC exchange if needed/ordered by physician.  Alternatives to this procedure were also discussed.  Bard Power PICC patient education guide, fact sheet on infection prevention and patient information card has been provided to patient /or left at bedside.    Gave verbal consent due to unable to write    PICC Placement Documentation  PICC Double Lumen 09/06/20 PICC Left Brachial 42 cm 0 cm (Active)  Indication for Insertion or Continuance of Line Poor Vasculature-patient has had multiple peripheral attempts or PIVs lasting less than 24 hours 09/06/20 1400  Exposed Catheter (cm) 0 cm 09/06/20 1400  Site Assessment Clean;Dry;Intact 09/06/20 1400  Lumen #1 Status Flushed;Saline locked;Blood return noted 09/06/20 1400  Lumen #2 Status Flushed;Saline locked;Blood return noted 09/06/20 1400  Dressing Type Transparent;Securing device 09/06/20 1400  Dressing Status Clean;Dry;Intact 09/06/20 1400  Antimicrobial disc in place? Yes 09/06/20 1400  Safety Lock Not Applicable [id number]  Line Care Connections checked and tightened 09/06/20 1400  Dressing Intervention New dressing 09/06/20 1400  Dressing Change Due 09/13/20 09/06/20 1400       [name] [name] Renee 09/06/2020, 2:55 PM

## 2020-09-06 NOTE — Anesthesia Procedure Notes (Signed)
Procedure Name: LMA Insertion Date/Time: 09/06/2020 6:28 PM Performed by: Rosaland Lao, CRNA Pre-anesthesia Checklist: Patient identified, Emergency Drugs available, Suction available and Patient being monitored Patient Re-evaluated:Patient Re-evaluated prior to induction Oxygen Delivery Method: Circle system utilized Preoxygenation: Pre-oxygenation with 100% oxygen Induction Type: IV induction LMA: LMA with gastric port inserted LMA Size: 4.0 Number of attempts: 1 Tube secured with: Tape Dental Injury: Teeth and Oropharynx as per pre-operative assessment

## 2020-09-06 NOTE — Progress Notes (Signed)
Patient ID: [name] [name], male   DOB: [birth year], 75 y.o.   MRN: [id number]  Plan for ampuation of right hand later today by Dr. Lenon Curt. Please keep NPO.    Lisette Abu, PA-C Orthopedic Surgery [phone]

## 2020-09-06 NOTE — Consult Note (Signed)
[place] Nurse Consult Note: Reason for Consult: WOC Nursing is consulted simultaneously with Hand Specialist from Orthopedics.  Their orders will supercede any that I provide for conservative wound care. See photos taken last evening in the ED.  Wound type: contraction, full and partial thickness wounds, infection Pressure Injury POA:N/A  Dressing procedure/placement/frequency:I have provided Nursing with conservative care orders for the right hand using a soap and water cleanse twice daily and thorough rinsing and drying. Xeroform gauze (antimicrobial, nonadherent, occlusive) will be used as a wound contact layer. This is to be topped with dry gauze and secured with a Kerlix roll gauze/paper tape and changed twice daily.  [place] nursing team will not follow, but will remain available to this patient, the nursing and medical teams.  Please re-consult if needed. Thanks, Maudie Flakes, MSN, RN, [place], Arther Abbott  Pager# [phone]

## 2020-09-06 NOTE — Progress Notes (Signed)
PT Cancellation Note  Patient Details Name: [name] [name] MRN: [id number] DOB: 1945/05/09   Cancelled Treatment:    Reason Eval/Treat Not Completed: Medical issues which prohibited therapy--pt to go to surgery. will await updated orders/recommendation from orthopedics.    [place] Acute Rehabilitation  Office: [phone] Pager: [phone]

## 2020-09-06 NOTE — Progress Notes (Signed)
OT Cancellation Note  Patient Details Name: [name] [name] MRN: [id number] DOB: 08/11/1945   Cancelled Treatment:    Reason Eval/Treat Not Completed: Other (comment) Patient pending surgery with Ortho today. Will wait for post op orders/recommendations   Jackelyn Poling OTR/L, MS Acute Rehabilitation Department Office# [phone] Pager# [phone]   [place] 09/06/2020, 1:41 PM

## 2020-09-06 NOTE — ED Notes (Signed)
Patient was a hard stick to obtain blood work.  MD obtained blood from R groin.  Only one blood culture able to be collect.  MD aware and wants antibiotics started

## 2020-09-06 NOTE — Op Note (Signed)
NAME: [name] [name], [place] MEDICAL RECORD NO: [id number] ACCOUNT NO: [id number] DATE OF BIRTH: [birth year] FACILITY: [facility] LOCATION: [facility] PHYSICIAN: Kimya Mccahill C. Lenon Curt, MD  Operative Report   DATE OF PROCEDURE: 09/06/2020  PREOPERATIVE DIAGNOSIS:  Severe infection, right hand with gangrene.  POSTOPERATIVE DIAGNOSIS:  Severe infection, right hand with gangrene.  PROCEDURE:  Amputation of the right hand just proximal to the wrist joint with local advancement flap.  ANESTHESIA:  General.  COMPLICATIONS:  No acute complications.  ESTIMATED BLOOD LOSS:  50 mL.  INDICATIONS:  The patient is a 75 year old gentleman who has been suffering with some hand issues on the right for some time now.  He recently presented to the emergency room with signs and symptoms of gangrene of several of his fingers and extensive  cellulitis and skin changes of the entire hand.  On evaluation, the hand did not seem salvageable and therefore amputation was offered to him and his power of attorney. They agreed with this course of action.  Consent was obtained.  DESCRIPTION OF PROCEDURE:  The patient was taken to the operating room and placed supine on the operating table.  Anesthesia was administered.  The hand was prepped and draped in normal sterile fashion.  A fishmouth type incision was made on the wrist  after the tourniquet was inflated on the upper arm to 250 mmHg.  Dissection was carried down through the radiocarpal joint with initial thoughts of leaving the DRUJ intact and amputation at the site.  However, the skin envelope did not appear well enough  to do this and therefore further skin was resected. Flaps both anterior and posteriorly were created.  The distal radius and distal ulna were transected with a bone saw.  Hemostasis was meticulously obtained after the tourniquet was released.  The  nerves were pulled distally, then transected. The radial and ulnar arteries were suture ligated.  Additional venous  structures were cauterized.  Flexor and extensor tendons were pulled distally and transected.  After adequate hemostasis was obtained, the  wound was closed in layers after it was advanced over the bone for a non-tension repair.  Wound was closed with multiple interrupted 3-0 Vicryl sutures deep and subdermal and the skin edges were everted and closed with staples.  Xeroform and a sterile  dressing was applied.  The patient tolerated the procedure well and was taken to recovery room stable.   SHW D: 09/06/2020 7:40:06 pm T: 09/06/2020 10:13:00 pm  JOB: [id number]

## 2020-09-06 NOTE — Transfer of Care (Signed)
Immediate Anesthesia Transfer of Care Note  Patient: [name] [name]  Procedure(s) Performed: AMPUTATION RIGHT HAND (Right: Hand)  Patient Location: PACU  Anesthesia Type:General  Level of Consciousness: awake, alert  and oriented  Airway & Oxygen Therapy: Patient Spontanous Breathing and Patient connected to face mask  Post-op Assessment: VSS  Post vital signs: Reviewed and stable  Last Vitals:  Vitals Value Taken Time  BP 151/64 09/06/20 1945  Temp    Pulse 95 09/06/20 1947  Resp 11 09/06/20 1947  SpO2 100 % 09/06/20 1947  Vitals shown include unvalidated device data.  Last Pain:  Vitals:   09/06/20 1721  TempSrc: Oral  PainSc:          Complications: No notable events documented.

## 2020-09-06 NOTE — Anesthesia Postprocedure Evaluation (Signed)
Anesthesia Post Note  Patient: [name] [name]  Procedure(s) Performed: AMPUTATION RIGHT HAND (Right: Hand)     Patient location during evaluation: PACU Anesthesia Type: General Level of consciousness: awake and alert Pain management: pain level controlled Vital Signs Assessment: post-procedure vital signs reviewed and stable Respiratory status: spontaneous breathing, nonlabored ventilation, respiratory function stable and patient connected to nasal cannula oxygen Cardiovascular status: blood pressure returned to baseline and stable Postop Assessment: no apparent nausea or vomiting Anesthetic complications: no   No notable events documented.  Last Vitals:  Vitals:   09/06/20 2000 09/06/20 2012  BP: (!) 158/58 (!) 160/82  Pulse: 90 86  Resp: 11 11  Temp:  (!) 36 C  SpO2: 100% 100%    Last Pain:  Vitals:   09/06/20 1940  TempSrc:   PainSc: 0-No pain                 Audry Pili

## 2020-09-06 NOTE — Anesthesia Preprocedure Evaluation (Addendum)
Anesthesia Evaluation  Patient identified by MRN, date of birth, ID band Patient awake    Reviewed: Allergy & Precautions, NPO status , Patient's Chart, lab work & pertinent test results  History of Anesthesia Complications Negative for: history of anesthetic complications  Airway Mallampati: III  TM Distance: >3 FB Neck ROM: Full    Dental  (+) Dental Advisory Given, Edentulous Lower, Edentulous Upper   Pulmonary neg pulmonary ROS,    Pulmonary exam normal        Cardiovascular hypertension, Pt. on medications + Peripheral Vascular Disease  Normal cardiovascular exam     Neuro/Psych PSYCHIATRIC DISORDERS Depression  Functionally quadriplegic     GI/Hepatic negative GI ROS, Neg liver ROS,   Endo/Other  diabetes, Type 2, Oral Hypoglycemic Agents K 3.0   Renal/GU negative Renal ROS     Musculoskeletal negative musculoskeletal ROS (+)   Abdominal   Peds  Hematology  (+) anemia ,   Anesthesia Other Findings Gangrene right hand   Reproductive/Obstetrics                           Anesthesia Physical Anesthesia Plan  ASA: 4  Anesthesia Plan: General   Post-op Pain Management:    Induction: Intravenous  PONV Risk Score and Plan: 2 and Treatment may vary due to age or medical condition, Ondansetron and Dexamethasone  Airway Management Planned: LMA  Additional Equipment: None  Intra-op Plan:   Post-operative Plan: Extubation in OR  Informed Consent: I have reviewed the patients History and Physical, chart, labs and discussed the procedure including the risks, benefits and alternatives for the proposed anesthesia with the patient or authorized representative who has indicated his/her understanding and acceptance.     Dental advisory given  Plan Discussed with: CRNA and Anesthesiologist  Anesthesia Plan Comments:        Anesthesia Quick Evaluation

## 2020-09-06 NOTE — Progress Notes (Signed)
Pharmacy Antibiotic Note  [name] [name] is a 75 y.o. male admitted on 09/05/2020 with worsening right hand cellulitis/gangrene of right hand. Pharmacy has been consulted for Vancomycin and Cefepime dosing.  Plan: Vancomycin 1g IV x 1 given in ED. Continue with Vancomycin '1500mg'$  IV q24h starting this PM. Vancomycin levels at steady state, as indicated.  Cefepime 2g IV q8h.  Metronidazole '500mg'$  IV q12h per MD.  Monitor renal function, cultures, clinical course.   Height: 5' 7.5" (171.5 cm) Weight: 78.4 kg (172 lb 13.5 oz) IBW/kg (Calculated) : 67.25  Temp (24hrs), Avg:97.9 F (36.6 C), Min:97.6 F (36.4 C), Max:98.2 F (36.8 C)  Recent Labs  Lab 09/05/20 0022 09/05/20 0023  WBC 7.0  --   CREATININE 0.94  --   LATICACIDVEN  --  1.4    Estimated Creatinine Clearance: 64.6 mL/min (by C-G formula based on SCr of 0.94 mg/dL).    Allergies  Allergen Reactions   Lisinopril Other (See Comments)    Hyperkalemia.    Antimicrobials this admission: 8/23 Vancomycin >> 8/23 Cefepime >> 8/23 Metronidazole >>  Dose adjustments this admission: --  Microbiology results: 8/22 Resp panel: negative 8/23 BCx x 1: pending 8/23 BCx x 2 sets: ordered 8/23 UCx: sent  Thank you for allowing pharmacy to be a part of this patient's care.    Lindell Spar, PharmD, BCPS Clinical Pharmacist  09/06/2020 5:18 PM

## 2020-09-07 ENCOUNTER — Encounter (HOSPITAL_COMMUNITY): Payer: Medicare Other

## 2020-09-07 ENCOUNTER — Other Ambulatory Visit: Payer: Self-pay

## 2020-09-07 ENCOUNTER — Encounter (HOSPITAL_COMMUNITY): Payer: Self-pay | Admitting: General Surgery

## 2020-09-07 LAB — CBC
HCT: 32.9 % — ABNORMAL LOW (ref 39.0–52.0)
Hemoglobin: 10.5 g/dL — ABNORMAL LOW (ref 13.0–17.0)
MCH: 31.2 pg (ref 26.0–34.0)
MCHC: 31.9 g/dL (ref 30.0–36.0)
MCV: 97.6 fL (ref 80.0–100.0)
Platelets: 213 10*3/uL (ref 150–400)
RBC: 3.37 MIL/uL — ABNORMAL LOW (ref 4.22–5.81)
RDW: 14.6 % (ref 11.5–15.5)
WBC: 7.5 10*3/uL (ref 4.0–10.5)
nRBC: 0 % (ref 0.0–0.2)

## 2020-09-07 LAB — COMPREHENSIVE METABOLIC PANEL
ALT: 7 U/L (ref 0–44)
AST: 14 U/L — ABNORMAL LOW (ref 15–41)
Albumin: 2.6 g/dL — ABNORMAL LOW (ref 3.5–5.0)
Alkaline Phosphatase: 91 U/L (ref 38–126)
Anion gap: 10 (ref 5–15)
BUN: 21 mg/dL (ref 8–23)
CO2: 24 mmol/L (ref 22–32)
Calcium: 8.4 mg/dL — ABNORMAL LOW (ref 8.9–10.3)
Chloride: 105 mmol/L (ref 98–111)
Creatinine, Ser: 0.71 mg/dL (ref 0.61–1.24)
GFR, Estimated: >60 mL/min (ref >60)
Glucose, Bld: 185 mg/dL — ABNORMAL HIGH (ref 70–99)
Potassium: 3.2 mmol/L — ABNORMAL LOW (ref 3.5–5.1)
Sodium: 139 mmol/L (ref 135–145)
Total Bilirubin: 1 mg/dL (ref 0.3–1.2)
Total Protein: 6.3 g/dL — ABNORMAL LOW (ref 6.5–8.1)

## 2020-09-07 LAB — MAGNESIUM: Magnesium: 1.6 mg/dL — ABNORMAL LOW (ref 1.7–2.4)

## 2020-09-07 LAB — URINE CULTURE: Culture: NO GROWTH

## 2020-09-07 LAB — GLUCOSE, CAPILLARY
Glucose-Capillary: 177 mg/dL — ABNORMAL HIGH (ref 70–99)
Glucose-Capillary: 175 mg/dL — ABNORMAL HIGH (ref 70–99)
Glucose-Capillary: 226 mg/dL — ABNORMAL HIGH (ref 70–99)

## 2020-09-07 MED ORDER — INSULIN ASPART 100 UNIT/ML IJ SOLN
0.0000 [IU] | Freq: Three times a day (TID) | INTRAMUSCULAR | Status: DC
  Administered 2020-09-07 – 2020-09-08 (×2): 2 [IU] via SUBCUTANEOUS
  Administered 2020-09-08: 3 [IU] via SUBCUTANEOUS
  Administered 2020-09-08: 4 [IU] via SUBCUTANEOUS
  Administered 2020-09-08 – 2020-09-09 (×2): 2 [IU] via SUBCUTANEOUS
  Administered 2020-09-09 (×2): 1 [IU] via SUBCUTANEOUS

## 2020-09-07 MED ORDER — POTASSIUM CHLORIDE CRYS ER 20 MEQ PO TBCR
40.0000 meq | EXTENDED_RELEASE_TABLET | ORAL | Status: AC
Start: 2020-09-07 — End: 2020-09-07
  Administered 2020-09-07 (×2): 40 meq via ORAL
  Filled 2020-09-07 (×2): qty 2

## 2020-09-07 MED ORDER — MAGNESIUM SULFATE 2 GM/50ML IV SOLN
2.0000 g | Freq: Once | INTRAVENOUS | Status: AC
  Administered 2020-09-07: 2 g via INTRAVENOUS
  Filled 2020-09-07: qty 50

## 2020-09-07 MED ORDER — METHYLPREDNISOLONE 4 MG PO TABS
8.0000 mg | ORAL_TABLET | Freq: Every day | ORAL | Status: DC
  Administered 2020-09-07 – 2020-09-08 (×2): 8 mg via ORAL
  Filled 2020-09-07 (×2): qty 2
  Filled 2020-09-07: qty 4

## 2020-09-07 MED ORDER — HYDROCHLOROTHIAZIDE 25 MG PO TABS
25.0000 mg | ORAL_TABLET | Freq: Every day | ORAL | Status: DC
  Administered 2020-09-07: 25 mg via ORAL
  Filled 2020-09-07: qty 1
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
## 2020-09-07 NOTE — Plan of Care (Signed)
Plan of care initiated and discussed. 

## 2020-09-07 NOTE — Consult Note (Signed)
[facility] Nurse Consult Note: Reason for Consult:Asked to assess LEs. Diminished pulses, cool extremities. Chronic, nonhealing wounds x several years; seen both at [facility] and in the [facility] outpatient wound care center, but not since 05/2020. Wound type: etiology not known, suspect PAD Pressure Injury POA: N/A Wound bed: dry, red Drainage (amount, consistency, odor) None Periwound: As noted above Dressing procedure/placement/frequency: I have communicated with Dr. Maylene Roes via [place] and recommended an ABI this admission and perhaps a consultation with either [facility] or Orthopedics (since they are in already due to the patient's hand necrosis present on this admission.  I have collaborated with the patient's bedside RN, K. Bodkin and we together agree that the interim, conservative care measures such as bilateral pressure redistribution heel boots and painting of the lesions with a betadine swabstick twice daily and allowing to air dry and leaving open to air (no dressing) is most appropriate.  [facility] nursing team will not follow, but will remain available to this patient, the nursing and medical teams.  Please re-consult if needed. Thanks, Maudie Flakes, MSN, RN, [place], Arther Abbott  Pager# [phone]
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
## 2020-09-08 LAB — BASIC METABOLIC PANEL
Anion gap: 7 (ref 5–15)
BUN: 23 mg/dL (ref 8–23)
CO2: 23 mmol/L (ref 22–32)
Calcium: 8.1 mg/dL — ABNORMAL LOW (ref 8.9–10.3)
Chloride: 108 mmol/L (ref 98–111)
Creatinine, Ser: 0.78 mg/dL (ref 0.61–1.24)
GFR, Estimated: >60 mL/min (ref >60)
Glucose, Bld: 314 mg/dL — ABNORMAL HIGH (ref 70–99)
Potassium: 4.2 mmol/L (ref 3.5–5.1)
Sodium: 138 mmol/L (ref 135–145)

## 2020-09-08 LAB — GLUCOSE, CAPILLARY
Glucose-Capillary: 314 mg/dL — ABNORMAL HIGH (ref 70–99)
Glucose-Capillary: 218 mg/dL — ABNORMAL HIGH (ref 70–99)
Glucose-Capillary: 251 mg/dL — ABNORMAL HIGH (ref 70–99)
Glucose-Capillary: 230 mg/dL — ABNORMAL HIGH (ref 70–99)
Glucose-Capillary: 197 mg/dL — ABNORMAL HIGH (ref 70–99)

## 2020-09-08 LAB — MAGNESIUM: Magnesium: 1.9 mg/dL (ref 1.7–2.4)

## 2020-09-08 MED ORDER — PREDNISONE 20 MG PO TABS
20.0000 mg | ORAL_TABLET | Freq: Every day | ORAL | Status: DC
  Administered 2020-09-09: 20 mg via ORAL
  Filled 2020-09-08: qty 1

## 2020-09-08 MED ORDER — INSULIN ASPART 100 UNIT/ML IJ SOLN: 0.0000 [IU] | Freq: Every day | INTRAMUSCULAR | Status: DC

## 2020-09-08 MED ORDER — INSULIN ASPART 100 UNIT/ML IJ SOLN
2.0000 [IU] | Freq: Three times a day (TID) | INTRAMUSCULAR | Status: DC
  Administered 2020-09-08 – 2020-09-09 (×4): 2 [IU] via SUBCUTANEOUS

## 2020-09-08 MED ORDER — FUROSEMIDE 10 MG/ML IJ SOLN: 20.0000 mg | Freq: Once | INTRAMUSCULAR | Status: DC

## 2020-09-08 MED ORDER — FUROSEMIDE 10 MG/ML IJ SOLN
20.0000 mg | Freq: Every day | INTRAMUSCULAR | Status: DC
  Administered 2020-09-08: 20 mg via INTRAVENOUS
  Filled 2020-09-08: qty 2
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
## 2020-09-08 NOTE — TOC Initial Note (Signed)
Transition of Care [facility]) - Initial/Assessment Note    Patient Details  Name: [name] [name] MRN: [id number] Date of Birth: 12/24/1945  Transition [facility] Care [facility] CM/SW Contact:    Allyssia Skluzacek, Marjie Skiff, RN Phone Number: 09/08/2020, 12:43 PM  Clinical Narrative:                 Pt is from home with daughter and granddaughter as caregivers. He is active with Dr. Dellia Nims at the wound care center. Pt is custodial care. He may need PT[facility] transport home as he is bedbound.  Expected Discharge Plan: Home/Self Care Barriers to Discharge: Continued Medical Work up   Expected Discharge Plan and Services Expected Discharge Plan: Home/Self Care   Discharge Planning Services: CM Consult   Living arrangements for the past 2 months: Apartment                     Prior Living Arrangements/Services Living arrangements for the past 2 months: Apartment Lives with:: Adult Children, Relatives          Need for Family Participation in Patient Care: Yes (Comment) Care giver support system in place?: Yes (comment)   Criminal Activity/Legal Involvement Pertinent to Current Situation/Hospitalization: No - Comment as needed  Activities of Daily Living Home Assistive Devices/Equipment: CBG Meter, Wheelchair ADL Screening (condition at time of admission) Patient's cognitive ability adequate to safely complete daily activities?: Yes Is the patient deaf or have difficulty hearing?: No Does the patient have difficulty seeing, even when wearing glasses/contacts?: No Does the patient have difficulty concentrating, remembering, or making decisions?: No Patient able to express need for assistance with ADLs?: Yes Does the patient have difficulty dressing or bathing?: Yes Independently performs ADLs?: No Communication: Independent Dressing (OT): Dependent Is this a change from baseline?: Change from baseline, expected to last >3 days Grooming: Dependent Is this a change from baseline?: Change from baseline,  expected to last >3 days Feeding: Dependent Is this a change from baseline?: Change from baseline, expected to last >3 days Bathing: Dependent Is this a change from baseline?: Change from baseline, expected to last >3 days Toileting: Needs assistance Is this a change from baseline?: Pre-admission baseline In/Out Bed: Needs assistance Is this a change from baseline?: Pre-admission baseline Walks in Home: Dependent (wheelchair bound) Is this a change from baseline?: Pre-admission baseline Does the patient have difficulty walking or climbing stairs?: Yes (secondary to paraplegia) Weakness of Legs: Both Weakness of Arms/Hands: Right  Permission Sought/Granted                  Emotional Assessment         Alcohol / Substance Use: Not Applicable Psych Involvement: No (comment)  Admission diagnosis:  Cellulitis of right hand [L03.113] Gangrene of hand ([place]) [I96] Patient Active Problem List   Diagnosis Date Noted   Gangrene of hand ([place]) 09/06/2020   Hypokalemia 09/06/2020   Hypomagnesemia 09/06/2020   Cellulitis of right hand    Cellulitis of multiple sites 01/12/2020   Sepsis ([place]) 01/12/2020   COVID-19 virus infection 01/12/2020   Neurocognitive deficits 12/03/2017   Lobar pneumonia ([place]) 11/30/2017   Anemia of chronic disease 11/30/2017   Open wound of right hand with complication Q000111Q   Malnutrition of moderate degree 11/18/2017   Wounds, multiple 11/16/2017   Multiple wounds 11/16/2017   Paraplegia ([place]) 11/16/2017   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 ([place]) [id number]   Metabolic acidosis Q000111Q   Pressure injury of skin 09/25/2017  Depression 04/08/2015   Essential hypertension    Diabetes mellitus without complication ([place])    HLD (hyperlipidemia)    Iron deficiency anemia    Hyperkalemia 02/27/2013   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

## 2020-09-08 NOTE — Progress Notes (Addendum)
Inpatient Diabetes Program Recommendations  AACE/ADA: New Consensus Statement on Inpatient Glycemic Control (2015)  Target Ranges:  Prepandial:   less than 140 mg/dL      Peak postprandial:   less than 180 mg/dL (1-2 hours)      Critically ill patients:  140 - 180 mg/dL   Lab Results  Component Value Date   GLUCAP 218 (H) 09/08/2020   HGBA1C 7.5 (H) 09/06/2020    Review of Glycemic Control Results for MENNO, ZENTENO (MRN [id number]) as of 09/08/2020 10:50  Ref. Range 09/07/2020 04:34 09/07/2020 11:30 09/07/2020 16:47 09/08/2020 05:27 09/08/2020 08:08  Glucose-Capillary Latest Ref Range: 70 - 99 mg/dL 177 (H) 175 (H) 226 (H) 314 (H) 218 (H)   Diabetes history: DM 2 Outpatient Diabetes medications: metformin Current orders for Inpatient glycemic control:  Novolog 0-6 units tid  Solumedrol 8 mg given yesterday and today A1c 7.5% on 8/3 PO prednisone 20 mg Daily  Inpatient Diabetes Program Recommendations:    -  Add Novolog hs scale -  Consider Novolog 2 units tid meal coverage if eating >50% of meals  Thanks,  Tama Headings RN, MSN, BC-ADM Inpatient Diabetes Coordinator Team Pager [phone] (8a-5p)

## 2020-09-09 LAB — BASIC METABOLIC PANEL
Anion gap: 6 (ref 5–15)
BUN: 28 mg/dL — ABNORMAL HIGH (ref 8–23)
CO2: 24 mmol/L (ref 22–32)
Calcium: 8.7 mg/dL — ABNORMAL LOW (ref 8.9–10.3)
Chloride: 110 mmol/L (ref 98–111)
Creatinine, Ser: 1.11 mg/dL (ref 0.61–1.24)
GFR, Estimated: >60 mL/min (ref >60)
Glucose, Bld: 177 mg/dL — ABNORMAL HIGH (ref 70–99)
Potassium: 3.4 mmol/L — ABNORMAL LOW (ref 3.5–5.1)
Sodium: 140 mmol/L (ref 135–145)

## 2020-09-09 LAB — GLUCOSE, CAPILLARY
Glucose-Capillary: 169 mg/dL — ABNORMAL HIGH (ref 70–99)
Glucose-Capillary: 211 mg/dL — ABNORMAL HIGH (ref 70–99)
Glucose-Capillary: 192 mg/dL — ABNORMAL HIGH (ref 70–99)

## 2020-09-09 LAB — SURGICAL PATHOLOGY

## 2020-09-09 LAB — MAGNESIUM: Magnesium: 1.8 mg/dL (ref 1.7–2.4)

## 2020-09-09 MED ORDER — PREDNISONE 10 MG PO TABS: ORAL_TABLET | ORAL | 0 refills | Status: DC

## 2020-09-09 MED ORDER — POTASSIUM CHLORIDE CRYS ER 20 MEQ PO TBCR
40.0000 meq | EXTENDED_RELEASE_TABLET | Freq: Once | ORAL | Status: AC
  Administered 2020-09-09: 40 meq via ORAL
  Filled 2020-09-09: qty 2

## 2020-09-09 MED ORDER — LOSARTAN POTASSIUM 50 MG PO TABS: 50.0000 mg | ORAL_TABLET | Freq: Every day | ORAL | 2 refills | Status: DC

## 2020-09-09 MED ORDER — METFORMIN HCL ER 500 MG PO TB24: 500.0000 mg | ORAL_TABLET | Freq: Two times a day (BID) | ORAL | 2 refills | Status: DC

## 2020-09-09 NOTE — Care Management Important Message (Signed)
Medicare IM printed for [facility] to give to the patient. 

## 2020-09-09 NOTE — Progress Notes (Signed)
Patient left via [facility]. Promofit removed. Vital signs taken. Patient tolerated transfer well. Belongings and packet handed to [facility] staff. No Iv's attached. Promofit removed, patient tolerated well.

## 2020-09-09 NOTE — Progress Notes (Signed)
AVS was not done prior to PT[facility] being notified of discharge and pick up
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
## 2020-09-10 NOTE — Progress Notes (Signed)
Patient's family member called upset that she was not informed that patient was discharged prior to arrival. Informed patient that a message was given to me incorrectly and no one told me to call her regarding patient's discharge. Patient was discharged on day shift and upon arrival to shift was told everything was done and that [facility] would be picking patient up. I apologized to family member about miscommunication.

## 2020-09-11 LAB — CULTURE, BLOOD (ROUTINE X 2)
Culture: NO GROWTH
Culture: NO GROWTH
Special Requests: ADEQUATE
Special Requests: ADEQUATE

## 2020-09-14 ENCOUNTER — Encounter (HOSPITAL_BASED_OUTPATIENT_CLINIC_OR_DEPARTMENT_OTHER): Payer: Medicare Other | Attending: Internal Medicine | Admitting: Internal Medicine

## 2020-09-14 ENCOUNTER — Other Ambulatory Visit: Payer: Self-pay

## 2020-09-14 DIAGNOSIS — L89322 Pressure ulcer of left buttock, stage 2: Secondary | ICD-10-CM | POA: Insufficient documentation

## 2020-09-14 DIAGNOSIS — I890 Lymphedema, not elsewhere classified: Secondary | ICD-10-CM | POA: Diagnosis not present

## 2020-09-14 DIAGNOSIS — G8220 Paraplegia, unspecified: Secondary | ICD-10-CM | POA: Insufficient documentation

## 2020-09-14 DIAGNOSIS — L97213 Non-pressure chronic ulcer of right calf with necrosis of muscle: Secondary | ICD-10-CM | POA: Insufficient documentation

## 2020-09-14 DIAGNOSIS — L97911 Non-pressure chronic ulcer of unspecified part of right lower leg limited to breakdown of skin: Secondary | ICD-10-CM | POA: Insufficient documentation

## 2020-09-14 DIAGNOSIS — Z8616 Personal history of COVID-19: Secondary | ICD-10-CM | POA: Insufficient documentation

## 2020-09-14 DIAGNOSIS — E11622 Type 2 diabetes mellitus with other skin ulcer: Secondary | ICD-10-CM | POA: Diagnosis present

## 2020-09-14 DIAGNOSIS — L97221 Non-pressure chronic ulcer of left calf limited to breakdown of skin: Secondary | ICD-10-CM | POA: Diagnosis not present

## 2020-09-14 DIAGNOSIS — L89016 Pressure-induced deep tissue damage of right elbow: Secondary | ICD-10-CM | POA: Insufficient documentation

## 2020-09-14 DIAGNOSIS — L03113 Cellulitis of right upper limb: Secondary | ICD-10-CM | POA: Insufficient documentation

## 2020-09-15 NOTE — Progress Notes (Signed)
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 Dressing: KerraCel Ag  Gelling Fiber Dressing, 2x2 in (silver alginate) (DME) (Generic) 1 x Per Day/30 Days ary Discharge Instructions: Apply silver alginate to wound bed as instructed Secondary Dressing: Bordered Gauze, 4x4 in (DME) (Generic) 1 x Per Day/30 Days Discharge Instructions: Apply over primary dressing as directed. WOUND #24: - Lower Leg Wound Laterality: Left, Anterior Cleanser: Soap and Water 1 x Per Day/30 Days Discharge Instructions: May shower and wash wound with dial antibacterial soap and water prior to dressing change. Topical: Triamcinolone 1 x Per Day/30 Days Discharge Instructions: Apply Triamcinolone to periwound and blistered areas Secondary Dressing: ABD Pad, 8x10 (DME) (Generic) 1 x Per Day/30 Days Discharge Instructions: Apply over primary dressing as directed. Secured With: [employer], 4.5x3.1 (in/yd) (DME) (Generic) 1 x Per Day/30 Days Discharge Instructions: Secure with Kerlix as directed. Secured With: 49M Medipore H Soft Cloth Surgical T ape, 2x2 (in/yd) (DME) (Generic) 1 x Per Day/30 Days Discharge Instructions: Secure dressing with tape as directed. WOUND #25: - Lower Leg Wound Laterality: Right, Anterior Cleanser: Soap and Water 1 x Per Day/30 Days Discharge Instructions: May shower and wash wound with dial antibacterial soap and water prior to dressing change. Topical: Triamcinolone 1 x Per Day/30 Days Discharge Instructions: Apply Triamcinolone to periwound and blistered areas Secondary Dressing: ABD Pad, 8x10 (DME) (Generic) 1 x Per Day/30 Days Discharge Instructions: Apply over primary dressing as directed. Secured With: [employer], 4.5x3.1 (in/yd) (DME) (Generic) 1 x Per Day/30 Days Discharge Instructions: Secure with Kerlix as directed. Secured With: 49M Medipore H Soft Cloth Surgical T ape, 2x2 (in/yd) (DME) (Generic) 1 x Per Day/30 Days Discharge Instructions: Secure dressing with tape as directed. 1. 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 Follows up with surgery 2. We put silver alginate on this area 3. Marked bilateral lower extremity crusting and denuded epithelium in his bilateral lower extremity. We washed a lot of that off his bilateral lower legs. Underlying dermis is inflamed. Multiple bilateral blisters or cysts or perhaps pustulosis suggested by Dr. Lorain Childes. We have never had a firm diagnosis here. He is on oral prednisolone as suggested by dermatology I have added in triamcinolone topically. I will see this back in 2 through 2 to 3 weeks I would like to see if this helps with the degree of inflammation in his bilateral  lower legs at this point if there is no improvement I will stop the triamcinolone 4. He does not appear to be systemically ill 5. He has a pressure ulcer on the right olecranon. This does not appear to be unhealthy and is small and looks like it is Tax inspector) Signed: 09/14/2020 4:28:25 PM By: [name] Ham MD Signed: 09/15/2020 5:19:32 PM By: [name] [name] Previous Signature: 09/14/2020 12:25:50 PM Version By: [name] Ham MD Entered By: [name] [name] on 09/14/2020 16:13:23 -------------------------------------------------------------------------------- SuperBill Details Patient Name: Date of Service: [name] Primer, [name] 09/14/2020 Medical Record Number: [id number] Patient Account Number: [id number] Date of Birth/Sex: Treating RN: [birth year] (75 y.o. 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

## 2020-09-15 NOTE — Progress Notes (Signed)
STONEY, KARCZEWSKI ([id number]) Visit Report for 09/14/2020 Arrival Information Details Patient Name: Date of Service: MCGLASSON, [place] RD R. 09/14/2020 10:30 A M Medical Record Number: [id number] Patient Account Number: [id number] Date of Birth/Sex: Treating RN: 1945/05/06 (75 y.o. [name], [name] Primary Care Yahaira Bruski: Theodora Blow, MIKE Other Clinician: Referring Jaeden Westbay: Treating Adarryl Goldammer/Extender: Joanne Chars, MIKE Weeks in Treatment: 37 Visit Information History Since Last Visit Added or deleted any medications: No Patient Arrived: Stretcher Any new allergies or adverse reactions: No Arrival Time: 10:40 Had a fall or experienced change in No Accompanied By: alone activities of daily living that may affect Transfer Assistance: Stretcher risk of falls: Patient Identification Verified: Yes Signs or symptoms of abuse/neglect since last visito No Secondary Verification Process Completed: Yes Hospitalized since last visit: Yes Patient Requires Transmission-Based Precautions: No Implantable device outside of the clinic excluding No Patient Has Alerts: Yes cellular tissue based products placed in the center Patient Alerts: R ABI non compressible since last visit: Pain Present Now: No Electronic Signature(s) Signed: 09/15/2020 4:23:50 PM By: Rhae Hammock RN Entered By: Rhae Hammock on 09/14/2020 11:28:52 -------------------------------------------------------------------------------- Encounter Discharge Information Details Patient Name: Date of Service: [name], [name] 09/14/2020 10:30 A M Medical Record Number: [id number] Patient Account Number: [id number] Date of Birth/Sex: Treating RN: Feb 16, 1945 (75 y.o. [name] Primary Care Demarie Hyneman: Theodora Blow, MIKE Other Clinician: Referring Kendria Halberg: Treating Radwan Cowley/Extender: Joanne Chars, MIKE Weeks in Treatment: [id number] Encounter Discharge Information Items Post Procedure Vitals Discharge  Condition: Stable Temperature (F): 98.0 Ambulatory Status: Stretcher Pulse (bpm): 64 Discharge Destination: Home Respiratory Rate (breaths/min): 18 Transportation: Private Auto Blood Pressure (mmHg): 148/92 Accompanied By: alone Schedule Follow-up Appointment: Yes Clinical Summary of Care: Patient Declined Electronic Signature(s) Signed: 09/15/2020 5:19:32 PM By: Levan Hurst RN, BSN Entered By: Levan Hurst on 09/14/2020 16:14:41 -------------------------------------------------------------------------------- Multi Wound Chart Details Patient Name: Date of Service: [name], [name] 09/14/2020 10:30 A M Medical Record Number: [id number] Patient Account Number: [id number] Date of Birth/Sex: Treating RN: 03-30-1945 (75 y.o. [name] Primary Care Burney Calzadilla: Theodora Blow, MIKE Other Clinician: Referring Khair Chasteen: Treating Asmara Backs/Extender: Joanne Chars, MIKE Weeks in Treatment: 141 Vital Signs Height(in): 67 Pulse(bpm): 39 Weight(lbs): 130 Blood Pressure(mmHg): 148/92 Body Mass Index(BMI): 20 Temperature(F): 98.0 Respiratory Rate(breaths/min): 18 Photos: Right Elbow Left, Anterior Lower Leg Right, Anterior Lower Leg Wound Location: Pressure Injury Gradually Appeared Gradually Appeared Wounding Event: Pressure Ulcer Lymphedema Lymphedema Primary Etiology: Anemia, Hypertension, Type II Anemia, Hypertension, Type II Anemia, Hypertension, Type II Comorbid History: Diabetes, Paraplegia Diabetes, Paraplegia Diabetes, Paraplegia 05/26/2020 09/14/2020 09/14/2020 Date Acquired: 15 0 0 Weeks of Treatment: Open Open Open Wound Status: 0.7x2x0.1 31.5x16x0.1 26.5x20x0.1 Measurements L x W x D (cm) 1.1 395.841 416.261 A (cm) : rea 0.11 39.584 41.626 Volume (cm) : 85.90% 0.00% 0.00% % Reduction in Area: 98.60% 0.00% 0.00% % 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 Full Thickness Without Exposed Full Thickness Without Exposed Classification: Support  Structures Support Structures Medium Large Large Exudate Amount: Serosanguineous Serous Serosanguineous Exudate Type: red, brown amber red, brown Exudate Color: Well defined, not attached Flat and Intact Flat and Intact Wound Margin: Large (67-100%) Large (67-100%) Large (67-100%) Granulation Amount: Red Pink Pink Granulation Quality: Small (1-33%) None Present (0%) None Present (0%) Necrotic Amount: Fat Layer (Subcutaneous Tissue): Yes Fat Layer (Subcutaneous Tissue): Yes Fat Layer (Subcutaneous Tissue): Yes Exposed Structures: Fascia: No Fascia: No Fascia: No Tendon: No Tendon: No Tendon: No Muscle: No Muscle: No Muscle: No Joint: No Joint: No Joint:  No Bone: No Bone: No Bone: No Small (1-33%) None None Epithelialization: Treatment Notes Electronic Signature(s) Signed: 09/14/2020 4:28:25 PM By: Linton Ham MD Signed: 09/15/2020 5:19:32 PM By: Levan Hurst RN, BSN Entered By: Linton Ham on 09/14/2020 11:55:56 -------------------------------------------------------------------------------- Multi-Disciplinary Care Plan Details Patient Name: Date of Service: Regino Schultze RD R. 09/14/2020 10:30 A M Medical Record Number: [id number] Patient Account Number: [id number] Date of Birth/Sex: Treating RN: April 20, 1945 (75 y.o. [name] Primary Care Aleenah Homen: Theodora Blow, MIKE Other Clinician: Referring Bernise Sylvain: Treating Ellarae Nevitt/Extender: Joanne Chars, MIKE Weeks in Treatment: [place] reviewed with physician Active Inactive Wound/Skin Impairment Nursing Diagnoses: Knowledge deficit related to ulceration/compromised skin integrity Goals: Patient/caregiver will verbalize understanding of skin care regimen Date Initiated: 05/20/2019 Target Resolution Date: 10/14/2020 Goal Status: Active Ulcer/skin breakdown will have a volume reduction of 30% by week 4 Date Initiated: 12/31/2017 Date Inactivated: 05/20/2019 Target  Resolution Date: 06/18/2018 Goal Status: Met Interventions: Assess patient/caregiver ability to obtain necessary supplies Assess patient/caregiver ability to perform ulcer/skin care regimen upon admission and as needed Assess ulceration(s) every visit Notes: Electronic Signature(s) Signed: 09/15/2020 5:19:32 PM By: Levan Hurst RN, BSN Entered By: Levan Hurst on 09/14/2020 16:12:48 -------------------------------------------------------------------------------- Pain Assessment Details Patient Name: Date of Service: [name], [name] 09/14/2020 10:30 A M Medical Record Number: [id number] Patient Account Number: [id number] Date of Birth/Sex: Treating RN: 24-Sep-1945 (75 y.o. [name], [name] Primary Care Jami Ohlin: Theodora Blow, MIKE Other Clinician: Referring Kreed Kauffman: Treating Gamble Enderle/Extender: Joanne Chars, MIKE Weeks in Treatment: 141 Active Problems Location of Pain Severity and Description of Pain Patient Has Paino No Site Locations Pain Management and Medication Current Pain Management: Electronic Signature(s) Signed: 09/15/2020 4:23:50 PM By: Rhae Hammock RN Entered By: Rhae Hammock on 09/14/2020 11:29:28 -------------------------------------------------------------------------------- Patient/Caregiver Education Details Patient Name: Date of Service: Regino Schultze [place] 8/31/2022andnbsp10:30 [place] Record Number: [id number] Patient Account Number: [id number] Date of Birth/Gender: Treating RN: 1945/12/13 (75 y.o. [name] Primary Care Physician: Theodora Blow, MIKE Other Clinician: Referring Physician: Treating Physician/Extender: Joanne Chars, MIKE Weeks in Treatment: [id number] Education Assessment Education Provided To: Patient Education Topics Provided Wound/Skin Impairment: Methods: Explain/Verbal Responses: State content correctly Electronic Signature(s) Signed: 09/15/2020 5:19:32 PM By: Levan Hurst RN, BSN Entered By:  Levan Hurst on 09/14/2020 16:12:59 -------------------------------------------------------------------------------- Wound Assessment Details Patient Name: Date of Service: [name], [name] 09/14/2020 10:30 A M Medical Record Number: [id number] Patient Account Number: [id number] Date of Birth/Sex: Treating RN: 26-Dec-1945 (75 y.o. [name], [name] Primary Care Leathia Farnell: Theodora Blow, MIKE Other Clinician: Referring Julyssa Kyer: Treating Zayli Villafuerte/Extender: Joanne Chars, MIKE Weeks in Treatment: 141 Wound Status Wound Number: 23 Primary Etiology: Pressure Ulcer Wound Location: Right Elbow Wound Status: Open Wounding Event: Pressure Injury Comorbid History: Anemia, Hypertension, Type II Diabetes, Paraplegia Date Acquired: 05/26/2020 Weeks Of Treatment: 15 Clustered Wound: No Photos Wound Measurements Length: (cm) 0.7 Width: (cm) 2 Depth: (cm) 0.1 Area: (cm) 1.1 Volume: (cm) 0.11 % Reduction in Area: 85.9% % Reduction in Volume: 98.6% Epithelialization: Small (1-33%) Tunneling: No Undermining: No Wound Description Classification: Category/Stage IV Wound Margin: Well defined, not attached Exudate Amount: Medium Exudate Type: Serosanguineous Exudate Color: red, brown Foul Odor After Cleansing: No Slough/Fibrino Yes Wound Bed Granulation Amount: Large (67-100%) Exposed Structure Granulation Quality: Red Fascia Exposed: No Necrotic Amount: Small (1-33%) Fat Layer (Subcutaneous Tissue) Exposed: Yes Necrotic Quality: Adherent Slough Tendon Exposed: No Muscle Exposed: No Joint Exposed: No Bone Exposed: No Treatment Notes Wound #23 (Elbow) Wound Laterality:  Right Cleanser Peri-Wound Care Topical Primary Dressing KerraCel Ag Gelling Fiber Dressing, 2x2 in (silver alginate) Discharge Instruction: Apply silver alginate to wound bed as instructed Secondary Dressing Bordered Gauze, 4x4 in Discharge Instruction: Apply over primary dressing as  directed. Secured With Compression Wrap Compression Stockings Environmental education officer) Signed: 09/15/2020 4:23:50 PM By: Rhae Hammock RN Signed: 09/15/2020 4:23:50 PM By: Rhae Hammock RN Entered By: Rhae Hammock on 09/14/2020 11:34:09 -------------------------------------------------------------------------------- Wound Assessment Details Patient Name: Date of Service: [name], [name] 09/14/2020 10:30 A M Medical Record Number: [id number] Patient Account Number: [id number] Date of Birth/Sex: Treating RN: 03-31-1945 (75 y.o. [name], [name] Primary Care Winry Egnew: Theodora Blow, MIKE Other Clinician: Referring Perl Kerney: Treating Dequante Tremaine/Extender: Joanne Chars, MIKE Weeks in Treatment: 141 Wound Status Wound Number: 24 Primary Etiology: Lymphedema Wound Location: Left, Anterior Lower Leg Wound Status: Open Wounding Event: Gradually Appeared Comorbid History: Anemia, Hypertension, Type II Diabetes, Paraplegia Date Acquired: 09/14/2020 Weeks Of Treatment: 0 Clustered Wound: No Photos Wound Measurements Length: (cm) 31.5 Width: (cm) 16 Depth: (cm) 0.1 Area: (cm) 395.841 Volume: (cm) 39.584 % Reduction in Area: 0% % Reduction in Volume: 0% Epithelialization: None Tunneling: No Undermining: No Wound Description Classification: Full Thickness Without Exposed Support Structures Wound Margin: Flat and Intact Exudate Amount: Large Exudate Type: Serous Exudate Color: amber Foul Odor After Cleansing: No Slough/Fibrino No Wound Bed Granulation Amount: Large (67-100%) Exposed Structure Granulation Quality: Pink Fascia Exposed: No Necrotic Amount: None Present (0%) Fat Layer (Subcutaneous Tissue) Exposed: Yes Tendon Exposed: No Muscle Exposed: No Joint Exposed: No Bone Exposed: No Treatment Notes Wound #24 (Lower Leg) Wound Laterality: Left, Anterior Cleanser Soap and Water Discharge Instruction: May shower and wash wound with dial  antibacterial soap and water prior to dressing change. Peri-Wound Care Topical Triamcinolone Discharge Instruction: Apply Triamcinolone to periwound and blistered areas Primary Dressing Secondary Dressing ABD Pad, 8x10 Discharge Instruction: Apply over primary dressing as directed. Secured With [employer], 4.5x3.1 (in/yd) Discharge Instruction: Secure with Kerlix as directed. 42M Medipore H Soft Cloth Surgical T ape, 2x2 (in/yd) Discharge Instruction: Secure dressing with tape as directed. Compression Wrap Compression Stockings Add-Ons Electronic Signature(s) Signed: 09/15/2020 4:23:50 PM By: Rhae Hammock RN Entered By: Rhae Hammock on 09/14/2020 11:36:03 -------------------------------------------------------------------------------- Wound Assessment Details Patient Name: Date of Service: [name], [name] 09/14/2020 10:30 A M Medical Record Number: [id number] Patient Account Number: [id number] Date of Birth/Sex: Treating RN: 08-Jul-1945 (75 y.o. [name], [name] Primary Care Yechezkel Fertig: Theodora Blow, MIKE Other Clinician: Referring Yuktha Kerchner: Treating Thalya Fouche/Extender: Joanne Chars, MIKE Weeks in Treatment: 141 Wound Status Wound Number: 25 Primary Etiology: Lymphedema Wound Location: Right, Anterior Lower Leg Wound Status: Open Wounding Event: Gradually Appeared Comorbid History: Anemia, Hypertension, Type II Diabetes, Paraplegia Date Acquired: 09/14/2020 Weeks Of Treatment: 0 Clustered Wound: No Photos Wound Measurements Length: (cm) 26.5 Width: (cm) 20 Depth: (cm) 0.1 Area: (cm) 416.261 Volume: (cm) 41.626 % Reduction in Area: 0% % Reduction in Volume: 0% Epithelialization: None Tunneling: No Undermining: No Wound Description Classification: Full Thickness Without Exposed Support Structures Wound Margin: Flat and Intact Exudate Amount: Large Exudate Type: Serosanguineous Exudate Color: red, brown Foul Odor After Cleansing:  No Slough/Fibrino No Wound Bed Granulation Amount: Large (67-100%) Exposed Structure Granulation Quality: Pink Fascia Exposed: No Necrotic Amount: None Present (0%) Fat Layer (Subcutaneous Tissue) Exposed: Yes Tendon Exposed: No Muscle Exposed: No Joint Exposed: No Bone Exposed: No Treatment Notes Wound #25 (Lower Leg) Wound Laterality: Right, Anterior Cleanser Soap and Water Discharge Instruction: May  shower and wash wound with dial antibacterial soap and water prior to dressing change. Peri-Wound Care Topical Triamcinolone Discharge Instruction: Apply Triamcinolone to periwound and blistered areas Primary Dressing Secondary Dressing ABD Pad, 8x10 Discharge Instruction: Apply over primary dressing as directed. Secured With [employer], 4.5x3.1 (in/yd) Discharge Instruction: Secure with Kerlix as directed. 58M Medipore H Soft Cloth Surgical T ape, 2x2 (in/yd) Discharge Instruction: Secure dressing with tape as directed. Compression Wrap Compression Stockings Add-Ons Electronic Signature(s) Signed: 09/15/2020 4:23:50 PM By: Rhae Hammock RN Entered By: Rhae Hammock on 09/14/2020 11:36:43 -------------------------------------------------------------------------------- Vitals Details Patient Name: Date of Service: [name], [name] 09/14/2020 10:30 A M Medical Record Number: [id number] Patient Account Number: [id number] Date of Birth/Sex: Treating RN: 1945-06-02 (75 y.o. [name], [name] Primary Care Reuven Braver: Theodora Blow, MIKE Other Clinician: Referring Lanyia Jewel: Treating Kailan Carmen/Extender: Joanne Chars, MIKE Weeks in Treatment: 141 Vital Signs Time Taken: 10:40 Temperature (F): 98.0 Height (in): 67 Pulse (bpm): 64 Weight (lbs): 130 Respiratory Rate (breaths/min): 18 Body Mass Index (BMI): 20.4 Blood Pressure (mmHg): 148/92 Reference Range: 80 - 120 mg / dl Electronic Signature(s) Signed: 09/15/2020 4:23:50 PM By: Rhae Hammock  RN Entered By: Rhae Hammock on 09/14/2020 11:29:21

## 2020-10-05 ENCOUNTER — Other Ambulatory Visit: Payer: Self-pay

## 2020-10-05 ENCOUNTER — Encounter (HOSPITAL_BASED_OUTPATIENT_CLINIC_OR_DEPARTMENT_OTHER): Payer: Medicare Other | Attending: Internal Medicine | Admitting: Internal Medicine

## 2020-10-05 DIAGNOSIS — L97909 Non-pressure chronic ulcer of unspecified part of unspecified lower leg with unspecified severity: Secondary | ICD-10-CM | POA: Insufficient documentation

## 2020-10-05 DIAGNOSIS — Z7984 Long term (current) use of oral hypoglycemic drugs: Secondary | ICD-10-CM | POA: Insufficient documentation

## 2020-10-05 DIAGNOSIS — I10 Essential (primary) hypertension: Secondary | ICD-10-CM | POA: Insufficient documentation

## 2020-10-05 DIAGNOSIS — S68411D Complete traumatic amputation of right hand at wrist level, subsequent encounter: Secondary | ICD-10-CM | POA: Diagnosis not present

## 2020-10-05 DIAGNOSIS — X58XXXD Exposure to other specified factors, subsequent encounter: Secondary | ICD-10-CM | POA: Insufficient documentation

## 2020-10-05 DIAGNOSIS — G8220 Paraplegia, unspecified: Secondary | ICD-10-CM | POA: Diagnosis not present

## 2020-10-05 DIAGNOSIS — L89016 Pressure-induced deep tissue damage of right elbow: Secondary | ICD-10-CM | POA: Insufficient documentation

## 2020-10-05 DIAGNOSIS — E1151 Type 2 diabetes mellitus with diabetic peripheral angiopathy without gangrene: Secondary | ICD-10-CM | POA: Insufficient documentation

## 2020-10-05 DIAGNOSIS — Z8616 Personal history of COVID-19: Secondary | ICD-10-CM | POA: Diagnosis not present

## 2020-10-05 DIAGNOSIS — S80829D Blister (nonthermal), unspecified lower leg, subsequent encounter: Secondary | ICD-10-CM | POA: Insufficient documentation

## 2020-10-05 NOTE — Progress Notes (Signed)
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 Facility Procedures CPT4 Code: [id number] Description: [id number] - WOUND CARE VISIT-LEV 5 EST PT Modifier: Quantity: 1 Physician Procedures : CPT4 Code Description Modifier [id number] - WC PHYS LEVEL 3 - EST PT ICD-10 Diagnosis Description L89.016 Pressure-induced deep tissue damage of right elbow S80.829D Blister (nonthermal), unspecified lower leg, subsequent encounter L97.909  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

## 2020-10-05 NOTE — Progress Notes (Signed)
PHARRELL, LEDFORD ([id number]) Visit Report for 10/05/2020 Arrival Information Details Patient Name: Date of Service: [name] [name], [name] [name] RD R. 10/05/2020 12:30 PM Medical Record Number: [id number] Patient Account Number: [id number] Date of Birth/Sex: Treating RN: 12/17/1945 (75 y.o. [name] [name] Primary Care Elynn Patteson: Theodora Blow, MIKE Other Clinician: Referring Tukker Byrns: Treating Lucifer Soja/Extender: Joanne Chars, MIKE Weeks in Treatment: 144 Visit Information History Since Last Visit Added or deleted any medications: No Patient Arrived: Stretcher Any new allergies or adverse reactions: No Arrival Time: 12:50 Had a fall or experienced change in No Accompanied By: EMS transport activities of daily living that may affect Transfer Assistance: Stretcher risk of falls: Patient Identification Verified: Yes Signs or symptoms of abuse/neglect since last visito No Secondary Verification Process Completed: Yes Hospitalized since last visit: No Patient Requires Transmission-Based Precautions: No Implantable device outside of the clinic excluding No Patient Has Alerts: Yes cellular tissue based products placed in the center Patient Alerts: R ABI non compressible since last visit: Has Dressing in Place as Prescribed: Yes Pain Present Now: No Electronic Signature(s) Signed: 10/05/2020 5:56:57 PM By: Levan Hurst RN, BSN Entered By: Levan Hurst on 10/05/2020 13:10:47 -------------------------------------------------------------------------------- Clinic Level of Care Assessment Details Patient Name: Date of Service: Mucha, [name] RD R. 10/05/2020 12:30 PM Medical Record Number: [id number] Patient Account Number: [id number] Date of Birth/Sex: Treating RN: 11-02-1945 (75 y.o. [name] [name] Primary Care Leanard Dimaio: Theodora Blow, MIKE Other Clinician: Referring Natalynn Pedone: Treating Kareena Arrambide/Extender: Joanne Chars, MIKE Weeks in Treatment: 144 Clinic Level of Care Assessment  Items TOOL 4 Quantity Score X- 1 0 Use when only an EandM is performed on FOLLOW-UP visit ASSESSMENTS - Nursing Assessment / Reassessment X- 1 10 Reassessment of Co-morbidities (includes updates in patient status) X- 1 5 Reassessment of Adherence to Treatment Plan ASSESSMENTS - Wound and Skin A ssessment / Reassessment _0  - 0 Simple Wound Assessment / Reassessment - one wound X- 3 5 Complex Wound Assessment / Reassessment - multiple wounds _1  - 0 Dermatologic / Skin Assessment (not related to wound area) ASSESSMENTS - Focused Assessment _2  - 0 Circumferential Edema Measurements - multi extremities _3  - 0 Nutritional Assessment / Counseling / Intervention X- 1 5 Lower Extremity Assessment (monofilament, tuning fork, pulses) _4  - 0 Peripheral Arterial Disease Assessment (using hand held doppler) ASSESSMENTS - Ostomy and/or Continence Assessment and Care _5  - 0 Incontinence Assessment and Management _6  - 0 Ostomy Care Assessment and Management (repouching, etc.) PROCESS - Coordination of Care X - Simple Patient / Family Education for ongoing care 1 15 _7  - 0 Complex (extensive) Patient / Family Education for ongoing care X- 1 10 Staff obtains Programmer, systems, Records, T Results / Process Orders est _8  - 0 Staff telephones HHA, Nursing Homes / Clarify orders / etc _9  - 0 Routine Transfer to another Facility (non-emergent condition) _10  - 0 Routine Hospital Admission (non-emergent condition) _11  - 0 New Admissions / Biomedical engineer / Ordering NPWT Apligraf, etc. , _12  - 0 Emergency Hospital Admission (emergent condition) X- 1 10 Simple Discharge Coordination _13  - 0 Complex (extensive) Discharge Coordination PROCESS - Special Needs _14  - 0 Pediatric / Minor Patient Management _15  - 0 Isolation Patient Management _16  - 0 Hearing / Language / Visual special needs _17  - 0 Assessment of Community assistance (transportation, D/C planning, etc.) _18  - 0 Additional  assistance / Altered mentation _19  - 0 Support Surface(s) Assessment (bed, cushion, seat, etc.) INTERVENTIONS - Wound Cleansing / Measurement _20  - 0 Simple Wound Cleansing - one wound  X- 3 5 Complex Wound Cleansing - multiple wounds X- 1 5 Wound Imaging (photographs - any number of wounds) _0  - 0 Wound Tracing (instead of photographs) _1  - 0 Simple Wound Measurement - one wound X- 3 5 Complex Wound Measurement - multiple wounds INTERVENTIONS - Wound Dressings X - Small Wound Dressing one or multiple wounds 1 10 _2  - 0 Medium Wound Dressing one or multiple wounds X- 2 20 Large Wound Dressing one or multiple wounds <QIWLNLGXQJJHERDE>_0<\/CXKGYJEHUDJSHFWY>_6  - 0 Application of Medications - topical <VZCHYIFOYDXAJOIN>_8<\/MVEHMCNOBSJGGEZM>_6  - 0 Application of Medications - injection INTERVENTIONS - Miscellaneous _5  - 0 External ear exam _6  - 0 Specimen Collection (cultures, biopsies, blood, body fluids, etc.) _7  - 0 Specimen(s) / Culture(s) sent or taken to Lab for analysis _8  - 0 Patient Transfer (multiple staff / Civil Service fast streamer / Similar devices) _9  - 0 Simple Staple / Suture removal (25 or less) _10  - 0 Complex Staple / Suture removal (26 or more) _11  - 0 Hypo / Hyperglycemic Management (close monitor of Blood Glucose) _12  - 0 Ankle / Brachial Index (ABI) - do not check if billed separately X- 1 5 Vital Signs Has the patient been seen at the hospital within the last three years: Yes Total Score: 160 Level Of Care: New/Established - Level 5 Electronic Signature(s) Signed: 10/05/2020 5:56:57 PM By: Levan Hurst RN, BSN Entered By: Levan Hurst on 10/05/2020 14:12:45 -------------------------------------------------------------------------------- Encounter Discharge Information Details Patient Name: Date of Service: [name] [name], [name] RD R. 10/05/2020 12:30 PM Medical Record Number: [id number] Patient Account Number: [id number] Date of Birth/Sex: Treating RN: 01-01-1946 (75 y.o. [name] [name] Primary Care Scott Vanderveer: Theodora Blow, MIKE Other  Clinician: Referring Shaeley Segall: Treating Ludy Messamore/Extender: Joanne Chars, MIKE Weeks in Treatment: 144 Encounter Discharge Information Items Discharge Condition: Stable Ambulatory Status: Stretcher Discharge Destination: Home Transportation: Private Auto Accompanied By: EMS transport Schedule Follow-up Appointment: Yes Clinical Summary of Care: Patient Declined Electronic Signature(s) Signed: 10/05/2020 5:56:57 PM By: Levan Hurst RN, BSN Entered By: Levan Hurst on 10/05/2020 14:13:53 -------------------------------------------------------------------------------- Lower Extremity Assessment Details Patient Name: Date of Service: [name] [name], [name] RD R. 10/05/2020 12:30 PM Medical Record Number: [id number] Patient Account Number: [id number] Date of Birth/Sex: Treating RN: [birth year] (75 y.o. [name] [name] Primary Care Jacub Waiters: Theodora Blow, MIKE Other Clinician: Referring Desera Graffeo: Treating Admir Candelas/Extender: Joanne Chars, MIKE Weeks in Treatment: 144 Vascular Assessment Pulses: Dorsalis Pedis Palpable: [Left:Yes] [Right:Yes] Electronic Signature(s) Signed: 10/05/2020 5:56:57 PM By: Levan Hurst RN, BSN Entered By: Levan Hurst on 10/05/2020 13:11:17 -------------------------------------------------------------------------------- Multi Wound Chart Details Patient Name: Date of Service: [name] [name], [name] RD R. 10/05/2020 12:30 PM Medical Record Number: [id number] Patient Account Number: [id number] Date of Birth/Sex: Treating RN: [birth year] (75 y.o. [name] [name] Primary Care Reshad Saab: Theodora Blow, MIKE Other Clinician: Referring Evea Sheek: Treating Theona Muhs/Extender: Joanne Chars, MIKE Weeks in Treatment: 144 Vital Signs Height(in): 67 Pulse(bpm): 76 Weight(lbs): 130 Blood Pressure(mmHg): 146/88 Body Mass Index(BMI): 20 Temperature(F): 97.9 Respiratory Rate(breaths/min): 18 Photos: Right Elbow Left, Anterior Lower Leg Right,  Anterior Lower Leg Wound Location: Pressure Injury Gradually Appeared Gradually Appeared Wounding Event: Pressure Ulcer Lymphedema Lymphedema Primary Etiology: Anemia, Hypertension, Type II Anemia, Hypertension, Type II Anemia, Hypertension, Type II Comorbid History: Diabetes, Paraplegia Diabetes, Paraplegia Diabetes, Paraplegia 05/26/2020 09/14/2020 09/14/2020 Date Acquired: _13 Weeks of Treatment: Open Open Open Wound Status: 1.5x3x0.2 31x16x0.1 32.5x10x0.1 Measurements L x W x D (cm) 3.534 389.557 255.254 A (cm) : rea 0.707 38.956 25.525 Volume (cm) : 54.50% 1.60% 38.70% % Reduction in Area: 90.90%  1.60% 38.70% % 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 Full Thickness Without Exposed Full Thickness Without Exposed Classification: Support Structures Support Structures Medium Large Large Exudate Amount: Serosanguineous Serous Serous Exudate Type: red, brown amber amber Exudate Color: Well defined, not attached Flat and Intact Flat and Intact Wound Margin: Large (67-100%) Large (67-100%) Large (67-100%) Granulation Amount: Red Pink, Hyper-granulation Pink, Hyper-granulation Granulation Quality: Small (1-33%) None Present (0%) None Present (0%) Necrotic Amount: Fat Layer (Subcutaneous Tissue): Yes Fat Layer (Subcutaneous Tissue): Yes Fat Layer (Subcutaneous Tissue): Yes Exposed Structures: Fascia: No Fascia: No Fascia: No Tendon: No Tendon: No Tendon: No Muscle: No Muscle: No Muscle: No Joint: No Joint: No Joint: No Bone: No Bone: No Bone: No Small (1-33%) None None Epithelialization: Treatment Notes Wound #23 (Elbow) Wound Laterality: Right Cleanser Peri-Wound Care Topical Primary Dressing Hydrofera Blue Ready Foam, 2.5 x2.5 in Discharge Instruction: Apply to wound bed as instructed Secondary Dressing Bordered Gauze, 4x4 in Discharge Instruction: Apply over primary dressing as directed. Secured With Compression Wrap Compression  Stockings Add-Ons Wound #24 (Lower Leg) Wound Laterality: Left, Anterior Cleanser Soap and Water Discharge Instruction: May shower and wash wound with dial antibacterial soap and water prior to dressing change. Peri-Wound Care Topical Triamcinolone Discharge Instruction: Apply Triamcinolone to periwound and blistered areas Primary Dressing Secondary Dressing ABD Pad, 8x10 Discharge Instruction: Apply over primary dressing as directed. Secured With [employer], 4.5x3.1 (in/yd) Discharge Instruction: Secure with Kerlix as directed. 64M Medipore H Soft Cloth Surgical T ape, 2x2 (in/yd) Discharge Instruction: Secure dressing with tape as directed. Compression Wrap Compression Stockings Add-Ons Wound #25 (Lower Leg) Wound Laterality: Right, Anterior Cleanser Soap and Water Discharge Instruction: May shower and wash wound with dial antibacterial soap and water prior to dressing change. Peri-Wound Care Topical Triamcinolone Discharge Instruction: Apply Triamcinolone to periwound and blistered areas Primary Dressing Secondary Dressing ABD Pad, 8x10 Discharge Instruction: Apply over primary dressing as directed. Secured With [employer], 4.5x3.1 (in/yd) Discharge Instruction: Secure with Kerlix as directed. 64M Medipore H Soft Cloth Surgical T ape, 2x2 (in/yd) Discharge Instruction: Secure dressing with tape as directed. Compression Wrap Compression Stockings Add-Ons Electronic Signature(s) Signed: 10/05/2020 4:56:16 PM By: Linton Ham MD Signed: 10/05/2020 5:56:57 PM By: Levan Hurst RN, BSN Entered By: Linton Ham on 10/05/2020 14:26:08 -------------------------------------------------------------------------------- Multi-Disciplinary Care Plan Details Patient Name: Date of Service: [name] [name] RD R. 10/05/2020 12:30 PM Medical Record Number: [id number] Patient Account Number: [id number] Date of Birth/Sex: Treating RN: [birth year] (75 y.o. [name] [name] Primary Care Nishat Livingston: Theodora Blow, MIKE Other Clinician: Referring Morgaine Kimball: Treating Carinne Brandenburger/Extender: Joanne Chars, MIKE Weeks in Treatment: [place] reviewed with physician Active Inactive Wound/Skin Impairment Nursing Diagnoses: Knowledge deficit related to ulceration/compromised skin integrity Goals: Patient/caregiver will verbalize understanding of skin care regimen Date Initiated: 05/20/2019 Target Resolution Date: 10/14/2020 Goal Status: Active Ulcer/skin breakdown will have a volume reduction of 30% by week 4 Date Initiated: 12/31/2017 Date Inactivated: 05/20/2019 Target Resolution Date: 06/18/2018 Goal Status: Met Interventions: Assess patient/caregiver ability to obtain necessary supplies Assess patient/caregiver ability to perform ulcer/skin care regimen upon admission and as needed Assess ulceration(s) every visit Notes: Electronic Signature(s) Signed: 10/05/2020 5:56:57 PM By: Levan Hurst RN, BSN Entered By: Levan Hurst on 10/05/2020 14:11:44 -------------------------------------------------------------------------------- Pain Assessment Details Patient Name: Date of Service: [name] [name], [name] RD R. 10/05/2020 12:30 PM Medical Record Number: [id number] Patient Account Number: [id number] Date of Birth/Sex: Treating RN: April 04, 1945 (75 y.o. [name] [name] Primary Care Nuriya Stuck: Theodora Blow, MIKE Other Clinician: Referring  Tracye Szuch: Treating Randie Bloodgood/Extender: Joanne Chars, MIKE Weeks in Treatment: 144 Active Problems Location of Pain Severity and Description of Pain Patient Has Paino No Site Locations Pain Management and Medication Current Pain Management: Electronic Signature(s) Signed: 10/05/2020 5:56:57 PM By: Levan Hurst RN, BSN Entered By: Levan Hurst on 10/05/2020 13:11:09 -------------------------------------------------------------------------------- Patient/Caregiver Education Details Patient  Name: Date of Service: [name] [name] RD R. 9/21/2022andnbsp12:30 PM Medical Record Number: [id number] Patient Account Number: [id number] Date of Birth/Gender: Treating RN: [birth year] (75 y.o. [name] [name] Primary Care Physician: Theodora Blow, MIKE Other Clinician: Referring Physician: Treating Physician/Extender: Joanne Chars, MIKE Weeks in Treatment: 144 Education Assessment Education Provided To: Patient Education Topics Provided Wound/Skin Impairment: Methods: Explain/Verbal Responses: State content correctly Electronic Signature(s) Signed: 10/05/2020 5:56:57 PM By: Levan Hurst RN, BSN Entered By: Levan Hurst on 10/05/2020 14:11:56 -------------------------------------------------------------------------------- Wound Assessment Details Patient Name: Date of Service: [name] [name], [name] RD R. 10/05/2020 12:30 PM Medical Record Number: [id number] Patient Account Number: [id number] Date of Birth/Sex: Treating RN: 1945/04/12 (75 y.o. [name] [name] Primary Care Jayleene Glaeser: Theodora Blow, MIKE Other Clinician: Referring Sourish Allender: Treating Claude Waldman/Extender: Joanne Chars, MIKE Weeks in Treatment: 144 Wound Status Wound Number: 23 Primary Etiology: Pressure Ulcer Wound Location: Right Elbow Wound Status: Open Wounding Event: Pressure Injury Comorbid History: Anemia, Hypertension, Type II Diabetes, Paraplegia Date Acquired: 05/26/2020 Weeks Of Treatment: 18 Clustered Wound: No Photos Wound Measurements Length: (cm) 1.5 Width: (cm) 3 Depth: (cm) 0.2 Area: (cm) 3.534 Volume: (cm) 0.707 % Reduction in Area: 54.5% % Reduction in Volume: 90.9% Epithelialization: Small (1-33%) Tunneling: No Undermining: No Wound Description Classification: Category/Stage IV Wound Margin: Well defined, not attached Exudate Amount: Medium Exudate Type: Serosanguineous Exudate Color: red, brown Foul Odor After Cleansing: No Slough/Fibrino Yes Wound Bed Granulation  Amount: Large (67-100%) Exposed Structure Granulation Quality: Red Fascia Exposed: No Necrotic Amount: Small (1-33%) Fat Layer (Subcutaneous Tissue) Exposed: Yes Necrotic Quality: Adherent Slough Tendon Exposed: No Muscle Exposed: No Joint Exposed: No Bone Exposed: No Treatment Notes Wound #23 (Elbow) Wound Laterality: Right Cleanser Peri-Wound Care Topical Primary Dressing Hydrofera Blue Ready Foam, 2.5 x2.5 in Discharge Instruction: Apply to wound bed as instructed Secondary Dressing Bordered Gauze, 4x4 in Discharge Instruction: Apply over primary dressing as directed. Secured With Compression Wrap Compression Stockings Environmental education officer) Signed: 10/05/2020 5:56:57 PM By: Levan Hurst RN, BSN Signed: 10/05/2020 5:56:57 PM By: Levan Hurst RN, BSN Entered By: Levan Hurst on 10/05/2020 13:08:06 -------------------------------------------------------------------------------- Wound Assessment Details Patient Name: Date of Service: [name] [name], [name] RD R. 10/05/2020 12:30 PM Medical Record Number: [id number] Patient Account Number: [id number] Date of Birth/Sex: Treating RN: May 29, 1945 (75 y.o. [name] [name] Primary Care Jamisha Hoeschen: Theodora Blow, MIKE Other Clinician: Referring Alden Feagan: Treating Lequita Meadowcroft/Extender: Joanne Chars, MIKE Weeks in Treatment: 144 Wound Status Wound Number: 24 Primary Etiology: Lymphedema Wound Location: Left, Anterior Lower Leg Wound Status: Open Wounding Event: Gradually Appeared Comorbid History: Anemia, Hypertension, Type II Diabetes, Paraplegia Date Acquired: 09/14/2020 Weeks Of Treatment: 3 Clustered Wound: No Photos Wound Measurements Length: (cm) 31 Width: (cm) 16 Depth: (cm) 0.1 Area: (cm) 389.557 Volume: (cm) 38.956 % Reduction in Area: 1.6% % Reduction in Volume: 1.6% Epithelialization: None Tunneling: No Undermining: No Wound Description Classification: Full Thickness Without Exposed Support  Structures Wound Margin: Flat and Intact Exudate Amount: Large Exudate Type: Serous Exudate Color: amber Foul Odor After Cleansing: No Slough/Fibrino No Wound Bed Granulation Amount: Large (67-100%) Exposed Structure Granulation Quality: Pink, Hyper-granulation Fascia Exposed: No Necrotic Amount: None Present (0%) Fat  Layer (Subcutaneous Tissue) Exposed: Yes Tendon Exposed: No Muscle Exposed: No Joint Exposed: No Bone Exposed: No Treatment Notes Wound #24 (Lower Leg) Wound Laterality: Left, Anterior Cleanser Soap and Water Discharge Instruction: May shower and wash wound with dial antibacterial soap and water prior to dressing change. Peri-Wound Care Topical Triamcinolone Discharge Instruction: Apply Triamcinolone to periwound and blistered areas Primary Dressing Secondary Dressing ABD Pad, 8x10 Discharge Instruction: Apply over primary dressing as directed. Secured With [employer], 4.5x3.1 (in/yd) Discharge Instruction: Secure with Kerlix as directed. 62M Medipore H Soft Cloth Surgical T ape, 2x2 (in/yd) Discharge Instruction: Secure dressing with tape as directed. Compression Wrap Compression Stockings Add-Ons Electronic Signature(s) Signed: 10/05/2020 5:56:57 PM By: Levan Hurst RN, BSN Entered By: Levan Hurst on 10/05/2020 13:08:54 -------------------------------------------------------------------------------- Wound Assessment Details Patient Name: Date of Service: [name] [name], [name] RD R. 10/05/2020 12:30 PM Medical Record Number: [id number] Patient Account Number: [id number] Date of Birth/Sex: Treating RN: 1945-07-02 (75 y.o. [name] [name] Primary Care Valentine Kuechle: Theodora Blow, MIKE Other Clinician: Referring Olly Shiner: Treating Clois Montavon/Extender: Joanne Chars, MIKE Weeks in Treatment: 144 Wound Status Wound Number: 25 Primary Etiology: Lymphedema Wound Location: Right, Anterior Lower Leg Wound Status: Open Wounding Event: Gradually  Appeared Comorbid History: Anemia, Hypertension, Type II Diabetes, Paraplegia Date Acquired: 09/14/2020 Weeks Of Treatment: 3 Clustered Wound: No Photos Wound Measurements Length: (cm) 32.5 Width: (cm) 10 Depth: (cm) 0.1 Area: (cm) 255.254 Volume: (cm) 25.525 % Reduction in Area: 38.7% % Reduction in Volume: 38.7% Epithelialization: None Tunneling: No Undermining: No Wound Description Classification: Full Thickness Without Exposed Support Structures Wound Margin: Flat and Intact Exudate Amount: Large Exudate Type: Serous Exudate Color: amber Foul Odor After Cleansing: No Slough/Fibrino No Wound Bed Granulation Amount: Large (67-100%) Exposed Structure Granulation Quality: Pink, Hyper-granulation Fascia Exposed: No Necrotic Amount: None Present (0%) Fat Layer (Subcutaneous Tissue) Exposed: Yes Tendon Exposed: No Muscle Exposed: No Joint Exposed: No Bone Exposed: No Treatment Notes Wound #25 (Lower Leg) Wound Laterality: Right, Anterior Cleanser Soap and Water Discharge Instruction: May shower and wash wound with dial antibacterial soap and water prior to dressing change. Peri-Wound Care Topical Triamcinolone Discharge Instruction: Apply Triamcinolone to periwound and blistered areas Primary Dressing Secondary Dressing ABD Pad, 8x10 Discharge Instruction: Apply over primary dressing as directed. Secured With [employer], 4.5x3.1 (in/yd) Discharge Instruction: Secure with Kerlix as directed. 62M Medipore H Soft Cloth Surgical T ape, 2x2 (in/yd) Discharge Instruction: Secure dressing with tape as directed. Compression Wrap Compression Stockings Add-Ons Electronic Signature(s) Signed: 10/05/2020 5:56:57 PM By: Levan Hurst RN, BSN Entered By: Levan Hurst on 10/05/2020 13:10:06 -------------------------------------------------------------------------------- Vitals Details Patient Name: Date of Service: [name] [name], [name] RD R. 10/05/2020 12:30 PM Medical  Record Number: [id number] Patient Account Number: [id number] Date of Birth/Sex: Treating RN: 27-Jul-1945 (75 y.o. [name] [name] Primary Care Shamecca Whitebread: Theodora Blow, MIKE Other Clinician: Referring Dimples Probus: Treating Kaci Dillie/Extender: Joanne Chars, MIKE Weeks in Treatment: 144 Vital Signs Time Taken: 12:50 Temperature (F): 97.9 Height (in): 67 Pulse (bpm): 76 Weight (lbs): 130 Respiratory Rate (breaths/min): 18 Body Mass Index (BMI): 20.4 Blood Pressure (mmHg): 146/88 Reference Range: 80 - 120 mg / dl Electronic Signature(s) Signed: 10/05/2020 5:56:57 PM By: Levan Hurst RN, BSN Entered By: Levan Hurst on 10/05/2020 13:11:04

## 2020-10-17 ENCOUNTER — Emergency Department (HOSPITAL_COMMUNITY)
Admission: EM | Admit: 2020-10-17 | Discharge: 2020-10-18 | Disposition: A | Discharge status: Home / Self Care | Payer: Medicare Other | Attending: Emergency Medicine | Admitting: Emergency Medicine

## 2020-10-17 ENCOUNTER — Other Ambulatory Visit: Payer: Self-pay

## 2020-10-17 DIAGNOSIS — Z7984 Long term (current) use of oral hypoglycemic drugs: Secondary | ICD-10-CM | POA: Insufficient documentation

## 2020-10-17 DIAGNOSIS — E875 Hyperkalemia: Secondary | ICD-10-CM | POA: Insufficient documentation

## 2020-10-17 DIAGNOSIS — Z87891 Personal history of nicotine dependence: Secondary | ICD-10-CM | POA: Insufficient documentation

## 2020-10-17 DIAGNOSIS — Z8616 Personal history of COVID-19: Secondary | ICD-10-CM | POA: Diagnosis not present

## 2020-10-17 DIAGNOSIS — E119 Type 2 diabetes mellitus without complications: Secondary | ICD-10-CM | POA: Diagnosis not present

## 2020-10-17 DIAGNOSIS — Z79899 Other long term (current) drug therapy: Secondary | ICD-10-CM | POA: Insufficient documentation

## 2020-10-17 DIAGNOSIS — M79604 Pain in right leg: Secondary | ICD-10-CM | POA: Insufficient documentation

## 2020-10-17 DIAGNOSIS — M79605 Pain in left leg: Secondary | ICD-10-CM | POA: Diagnosis not present

## 2020-10-17 DIAGNOSIS — I10 Essential (primary) hypertension: Secondary | ICD-10-CM | POA: Insufficient documentation

## 2020-10-17 LAB — BASIC METABOLIC PANEL
Anion gap: 8 (ref 5–15)
BUN: 52 mg/dL — ABNORMAL HIGH (ref 8–23)
CO2: 20 mmol/L — ABNORMAL LOW (ref 22–32)
Calcium: 8.2 mg/dL — ABNORMAL LOW (ref 8.9–10.3)
Chloride: 108 mmol/L (ref 98–111)
Creatinine, Ser: 1.31 mg/dL — ABNORMAL HIGH (ref 0.61–1.24)
GFR, Estimated: 57 mL/min — ABNORMAL LOW (ref >60)
Glucose, Bld: 303 mg/dL — ABNORMAL HIGH (ref 70–99)
Potassium: 6 mmol/L — ABNORMAL HIGH (ref 3.5–5.1)
Sodium: 136 mmol/L (ref 135–145)

## 2020-10-17 LAB — CBC WITH DIFFERENTIAL/PLATELET
Abs Immature Granulocytes: 0.02 10*3/uL (ref 0.00–0.07)
Basophils Absolute: 0 10*3/uL (ref 0.0–0.1)
Basophils Relative: 0 %
Eosinophils Absolute: 0.1 10*3/uL (ref 0.0–0.5)
Eosinophils Relative: 1 %
HCT: 34.6 % — ABNORMAL LOW (ref 39.0–52.0)
Hemoglobin: 11 g/dL — ABNORMAL LOW (ref 13.0–17.0)
Immature Granulocytes: 0 %
Lymphocytes Relative: 24 %
Lymphs Abs: 1.5 10*3/uL (ref 0.7–4.0)
MCH: 32.3 pg (ref 26.0–34.0)
MCHC: 31.8 g/dL (ref 30.0–36.0)
MCV: 101.5 fL — ABNORMAL HIGH (ref 80.0–100.0)
Monocytes Absolute: 0.5 10*3/uL (ref 0.1–1.0)
Monocytes Relative: 8 %
Neutro Abs: 4 10*3/uL (ref 1.7–7.7)
Neutrophils Relative %: 67 %
Platelets: 254 10*3/uL (ref 150–400)
RBC: 3.41 MIL/uL — ABNORMAL LOW (ref 4.22–5.81)
RDW: 16.4 % — ABNORMAL HIGH (ref 11.5–15.5)
WBC: 6 10*3/uL (ref 4.0–10.5)
nRBC: 0 % (ref 0.0–0.2)

## 2020-10-17 MED ORDER — INSULIN ASPART 100 UNIT/ML IJ SOLN
3.0000 [IU] | Freq: Once | INTRAMUSCULAR | Status: AC
  Administered 2020-10-17: 3 [IU] via SUBCUTANEOUS
  Filled 2020-10-17: qty 0.03

## 2020-10-17 MED ORDER — SODIUM ZIRCONIUM CYCLOSILICATE 10 G PO PACK
10.0000 g | PACK | Freq: Once | ORAL | Status: AC
  Administered 2020-10-17: 10 g via ORAL
  Filled 2020-10-17: qty 1

## 2020-10-17 MED ORDER — SODIUM CHLORIDE 0.9 % IV BOLUS
500.0000 mL | Freq: Once | INTRAVENOUS | Status: AC
  Administered 2020-10-17: 500 mL via INTRAVENOUS

## 2020-10-17 NOTE — ED Provider Notes (Signed)
Patient care assumed at 2300.  Pt here for evaluation of BLE wounds.  BMP with hyperkalemia.  Plan to recheck after IVF.    Repeat BMP improved when compared to priors, hyperkalemia has resolved. BUN and creatinine is down trending. Bilateral lower extremity wounds are well perfused, do not appear to be acutely infected or necrotic. He does have an increased area of erythema to the right upper extremity, question developing cellulitis. Will start antibiotics in setting of this. Discussed the findings of these studies with patient's daughter. Plan to discharge home on oral antibiotics with close PCP follow-up to recheck his renal function this week. Presentation is not consistent with necrotizing soft tissue infection, DVT, sepsis.   Quintella Reichert, MD 10/18/20 [phone]
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
## 2020-10-18 DIAGNOSIS — M79604 Pain in right leg: Secondary | ICD-10-CM | POA: Diagnosis not present

## 2020-10-18 LAB — BASIC METABOLIC PANEL
Anion gap: 5 (ref 5–15)
BUN: 45 mg/dL — ABNORMAL HIGH (ref 8–23)
CO2: 20 mmol/L — ABNORMAL LOW (ref 22–32)
Calcium: 8.5 mg/dL — ABNORMAL LOW (ref 8.9–10.3)
Chloride: 115 mmol/L — ABNORMAL HIGH (ref 98–111)
Creatinine, Ser: 1.27 mg/dL — ABNORMAL HIGH (ref 0.61–1.24)
GFR, Estimated: 59 mL/min — ABNORMAL LOW (ref >60)
Glucose, Bld: 261 mg/dL — ABNORMAL HIGH (ref 70–99)
Potassium: 4.4 mmol/L (ref 3.5–5.1)
Sodium: 140 mmol/L (ref 135–145)

## 2020-10-18 MED ORDER — DOXYCYCLINE HYCLATE 100 MG PO TABS
100.0000 mg | ORAL_TABLET | Freq: Once | ORAL | Status: AC
  Administered 2020-10-18: 100 mg via ORAL
  Filled 2020-10-18: qty 1

## 2020-10-18 MED ORDER — DOXYCYCLINE HYCLATE 100 MG PO CAPS
100.0000 mg | ORAL_CAPSULE | Freq: Two times a day (BID) | ORAL | 0 refills | Status: DC
Start: 2020-10-18 — End: 2021-01-14

## 2020-10-18 NOTE — ED Notes (Signed)
Confirmed that patient is on the list for [facility].

## 2020-10-18 NOTE — ED Notes (Signed)
Called [facility] to arrange transport home

## 2020-10-18 NOTE — ED Notes (Signed)
Spoke with daughter [name] who patient lives with to confirm that she is home and that Corey Harold is bringing the patient home at this time.

## 2020-10-18 NOTE — Discharge Instructions (Addendum)
[name] [name] kidney function was abnormal today. He will need to have his kidney function rechecked in the next 3 to 4 days. Encourage him to drink fluids. Have him rechecked if he has new or concerning symptoms.  There is concern that he might be developing infection to the right arm. He will need to take antibiotics twice daily.

## 2020-11-02 ENCOUNTER — Encounter (HOSPITAL_BASED_OUTPATIENT_CLINIC_OR_DEPARTMENT_OTHER): Payer: PRIVATE HEALTH INSURANCE | Admitting: Internal Medicine

## 2020-11-08 ENCOUNTER — Encounter (HOSPITAL_BASED_OUTPATIENT_CLINIC_OR_DEPARTMENT_OTHER): Payer: Medicare Other | Attending: Internal Medicine | Admitting: Internal Medicine

## 2020-11-08 ENCOUNTER — Other Ambulatory Visit: Payer: Self-pay

## 2020-11-08 DIAGNOSIS — L97319 Non-pressure chronic ulcer of right ankle with unspecified severity: Secondary | ICD-10-CM | POA: Insufficient documentation

## 2020-11-09 ENCOUNTER — Encounter (HOSPITAL_BASED_OUTPATIENT_CLINIC_OR_DEPARTMENT_OTHER): Payer: Medicare Other | Admitting: Internal Medicine

## 2020-12-20 ENCOUNTER — Encounter (HOSPITAL_BASED_OUTPATIENT_CLINIC_OR_DEPARTMENT_OTHER): Payer: PRIVATE HEALTH INSURANCE | Admitting: Internal Medicine

## 2020-12-27 ENCOUNTER — Encounter (HOSPITAL_BASED_OUTPATIENT_CLINIC_OR_DEPARTMENT_OTHER): Payer: Medicare Other | Attending: Internal Medicine | Admitting: Internal Medicine

## 2020-12-27 ENCOUNTER — Other Ambulatory Visit: Payer: Self-pay

## 2020-12-27 DIAGNOSIS — S80829D Blister (nonthermal), unspecified lower leg, subsequent encounter: Secondary | ICD-10-CM | POA: Insufficient documentation

## 2020-12-27 DIAGNOSIS — L97518 Non-pressure chronic ulcer of other part of right foot with other specified severity: Secondary | ICD-10-CM | POA: Insufficient documentation

## 2020-12-27 DIAGNOSIS — Z8616 Personal history of COVID-19: Secondary | ICD-10-CM | POA: Diagnosis not present

## 2020-12-27 DIAGNOSIS — L89016 Pressure-induced deep tissue damage of right elbow: Secondary | ICD-10-CM | POA: Diagnosis present

## 2020-12-27 DIAGNOSIS — L97909 Non-pressure chronic ulcer of unspecified part of unspecified lower leg with unspecified severity: Secondary | ICD-10-CM | POA: Diagnosis not present

## 2020-12-27 DIAGNOSIS — E11621 Type 2 diabetes mellitus with foot ulcer: Secondary | ICD-10-CM | POA: Diagnosis not present

## 2020-12-27 DIAGNOSIS — L89622 Pressure ulcer of left heel, stage 2: Secondary | ICD-10-CM | POA: Insufficient documentation

## 2020-12-27 DIAGNOSIS — Z8673 Personal history of transient ischemic attack (TIA), and cerebral infarction without residual deficits: Secondary | ICD-10-CM | POA: Insufficient documentation

## 2020-12-27 DIAGNOSIS — S68411D Complete traumatic amputation of right hand at wrist level, subsequent encounter: Secondary | ICD-10-CM | POA: Insufficient documentation

## 2020-12-27 DIAGNOSIS — S14109D Unspecified injury at unspecified level of cervical spinal cord, subsequent encounter: Secondary | ICD-10-CM | POA: Diagnosis not present

## 2020-12-27 DIAGNOSIS — G8220 Paraplegia, unspecified: Secondary | ICD-10-CM | POA: Diagnosis not present

## 2020-12-27 DIAGNOSIS — G8250 Quadriplegia, unspecified: Secondary | ICD-10-CM | POA: Insufficient documentation

## 2020-12-27 NOTE — Progress Notes (Signed)
[name] [name], [name] [name] ([id number]) Visit Report for 12/27/2020 Arrival Information Details Patient Name: Date of Service: [name] [name], [name] [name] RD R. 12/27/2020 10:45 A M Medical Record Number: [id number] Patient Account Number: [id number] Date of Birth/Sex: Treating RN: 1946/01/09 (75 y.o. [name] Primary Care Kamaryn Grimley: Theodora Blow, MIKE Other Clinician: Referring Jadd Gasior: Treating Quinetta Shilling/Extender: Joanne Chars, MIKE Weeks in Treatment: 23 Visit Information History Since Last Visit Added or deleted any medications: No Patient Arrived: Stretcher Any new allergies or adverse reactions: No Arrival Time: 10:45 Had a fall or experienced change in No Accompanied By: EMS transport activities of daily living that may affect Transfer Assistance: Stretcher risk of falls: Patient Requires Transmission-Based Precautions: No Signs or symptoms of abuse/neglect since last visito No Patient Has Alerts: Yes Hospitalized since last visit: No Patient Alerts: R ABI non compressible Implantable device outside of the clinic excluding No cellular tissue based products placed in the center since last visit: Has Dressing in Place as Prescribed: Yes Pain Present Now: No Electronic Signature(s) Signed: 12/27/2020 4:51:20 PM By: Levan Hurst RN, BSN Entered By: Levan Hurst on 12/27/2020 10:45:35 -------------------------------------------------------------------------------- Clinic Level of Care Assessment Details Patient Name: Date of Service: [name] [name], [name] RD R. 12/27/2020 10:45 A M Medical Record Number: [id number] Patient Account Number: [id number] Date of Birth/Sex: Treating RN: 1945-10-14 (75 y.o. [name] Primary Care Lennard Capek: Theodora Blow, MIKE Other Clinician: Referring Gianmarco Roye: Treating Chas Axel/Extender: Joanne Chars, MIKE Weeks in Treatment: 156 Clinic Level of Care Assessment Items TOOL 4 Quantity Score X- 1 0 Use when only an EandM is performed on  FOLLOW-UP visit ASSESSMENTS - Nursing Assessment / Reassessment X- 1 10 Reassessment of Co-morbidities (includes updates in patient status) X- 1 5 Reassessment of Adherence to Treatment Plan ASSESSMENTS - Wound and Skin A ssessment / Reassessment '[]'  - 0 Simple Wound Assessment / Reassessment - one wound X- 4 5 Complex Wound Assessment / Reassessment - multiple wounds '[]'  - 0 Dermatologic / Skin Assessment (not related to wound area) ASSESSMENTS - Focused Assessment '[]'  - 0 Circumferential Edema Measurements - multi extremities '[]'  - 0 Nutritional Assessment / Counseling / Intervention X- 1 5 Lower Extremity Assessment (monofilament, tuning fork, pulses) '[]'  - 0 Peripheral Arterial Disease Assessment (using hand held doppler) ASSESSMENTS - Ostomy and/or Continence Assessment and Care '[]'  - 0 Incontinence Assessment and Management '[]'  - 0 Ostomy Care Assessment and Management (repouching, etc.) PROCESS - Coordination of Care X - Simple Patient / Family Education for ongoing care 1 15 '[]'  - 0 Complex (extensive) Patient / Family Education for ongoing care X- 1 10 Staff obtains Programmer, systems, Records, T Results / Process Orders est '[]'  - 0 Staff telephones HHA, Nursing Homes / Clarify orders / etc '[]'  - 0 Routine Transfer to another Facility (non-emergent condition) '[]'  - 0 Routine Hospital Admission (non-emergent condition) '[]'  - 0 New Admissions / Biomedical engineer / Ordering NPWT Apligraf, etc. , '[]'  - 0 Emergency Hospital Admission (emergent condition) X- 1 10 Simple Discharge Coordination '[]'  - 0 Complex (extensive) Discharge Coordination PROCESS - Special Needs '[]'  - 0 Pediatric / Minor Patient Management '[]'  - 0 Isolation Patient Management '[]'  - 0 Hearing / Language / Visual special needs '[]'  - 0 Assessment of Community assistance (transportation, D/C planning, etc.) '[]'  - 0 Additional assistance / Altered mentation '[]'  - 0 Support Surface(s) Assessment (bed, cushion,  seat, etc.) INTERVENTIONS - Wound Cleansing / Measurement '[]'  - 0 Simple Wound Cleansing - one wound X- 4 5 Complex Wound Cleansing -  multiple wounds X- 1 5 Wound Imaging (photographs - any number of wounds) '[]'  - 0 Wound Tracing (instead of photographs) '[]'  - 0 Simple Wound Measurement - one wound X- 4 5 Complex Wound Measurement - multiple wounds INTERVENTIONS - Wound Dressings X - Small Wound Dressing one or multiple wounds 1 10 '[]'  - 0 Medium Wound Dressing one or multiple wounds X- 2 20 Large Wound Dressing one or multiple wounds '[]'  - 0 Application of Medications - topical '[]'  - 0 Application of Medications - injection INTERVENTIONS - Miscellaneous '[]'  - 0 External ear exam '[]'  - 0 Specimen Collection (cultures, biopsies, blood, body fluids, etc.) '[]'  - 0 Specimen(s) / Culture(s) sent or taken to Lab for analysis '[]'  - 0 Patient Transfer (multiple staff / Civil Service fast streamer / Similar devices) '[]'  - 0 Simple Staple / Suture removal (25 or less) '[]'  - 0 Complex Staple / Suture removal (26 or more) '[]'  - 0 Hypo / Hyperglycemic Management (close monitor of Blood Glucose) '[]'  - 0 Ankle / Brachial Index (ABI) - do not check if billed separately X- 1 5 Vital Signs Has the patient been seen at the hospital within the last three years: Yes Total Score: 175 Level Of Care: New/Established - Level 5 Electronic Signature(s) Signed: 12/27/2020 4:51:20 PM By: Levan Hurst RN, BSN Entered By: Levan Hurst on 12/27/2020 16:26:31 -------------------------------------------------------------------------------- Encounter Discharge Information Details Patient Name: Date of Service: [name] [name], [name] RD R. 12/27/2020 10:45 A M Medical Record Number: [id number] Patient Account Number: [id number] Date of Birth/Sex: Treating RN: 10-09-1945 (75 y.o. [name] Primary Care Marquay Kruse: Theodora Blow, MIKE Other Clinician: Referring Sakia Schrimpf: Treating Cariann Kinnamon/Extender: Joanne Chars,  MIKE Weeks in Treatment: 156 Encounter Discharge Information Items Discharge Condition: Stable Ambulatory Status: Stretcher Discharge Destination: Home Transportation: Ambulance Accompanied By: EMS transport Schedule Follow-up Appointment: Yes Clinical Summary of Care: Patient Declined Electronic Signature(s) Signed: 12/27/2020 4:51:20 PM By: Levan Hurst RN, BSN Entered By: Levan Hurst on 12/27/2020 16:27:33 -------------------------------------------------------------------------------- Lower Extremity Assessment Details Patient Name: Date of Service: [name] [name], [name] RD R. 12/27/2020 10:45 A M Medical Record Number: [id number] Patient Account Number: [id number] Date of Birth/Sex: Treating RN: 03-24-1945 (75 y.o. [name] Primary Care Ashwika Freels: Theodora Blow, MIKE Other Clinician: Referring Lorann Tani: Treating Shamarcus Hoheisel/Extender: Joanne Chars, MIKE Weeks in Treatment: 156 Edema Assessment Assessed: [Left: No] [Right: No] [Left: Edema] [Right: :] Calf Left: Right: Point of Measurement: From Medial Instep 42 cm 43.5 cm Ankle Left: Right: Point of Measurement: From Medial Instep 21 cm 22 cm Vascular Assessment Pulses: Dorsalis Pedis Palpable: [Left:Yes] [Right:Yes] Electronic Signature(s) Signed: 12/27/2020 4:51:20 PM By: Levan Hurst RN, BSN Entered By: Levan Hurst on 12/27/2020 10:48:16 -------------------------------------------------------------------------------- Multi Wound Chart Details Patient Name: Date of Service: [name] [name], [name] RD R. 12/27/2020 10:45 A M Medical Record Number: [id number] Patient Account Number: [id number] Date of Birth/Sex: Treating RN: 07/09/1945 (75 y.o. [name] Primary Care Story Conti: Theodora Blow, MIKE Other Clinician: Referring Kynnedy Carreno: Treating Rodrigues Urbanek/Extender: Joanne Chars, MIKE Weeks in Treatment: 156 Vital Signs Height(in): 67 Pulse(bpm): 25 Weight(lbs): 130 Blood Pressure(mmHg):  134/82 Body Mass Index(BMI): 20 Temperature(F): 97.9 Respiratory Rate(breaths/min): 18 Photos: [23:No Photos Right Elbow] [24:No Photos Left, Anterior Lower Leg] [25:No Photos Right, Anterior Lower Leg] Wound Location: [23:Pressure Injury] [24:Gradually Appeared] [25:Gradually Appeared] Wounding Event: [23:Pressure Ulcer] [24:Lymphedema] [25:Lymphedema] Primary Etiology: [23:Anemia, Hypertension, Type II] [24:Anemia, Hypertension, Type II] [25:Anemia, Hypertension, Type II] Comorbid History: [23:Diabetes, Paraplegia 05/26/2020] [24:Diabetes, Paraplegia 09/14/2020] [25:Diabetes, Paraplegia 09/14/2020] Date Acquired: [23:30] [24:14] [25:14] Weeks  of Treatment: [23:Open] [24:Open] [25:Open] Wound Status: [23:2.5x2.4x0.2] [24:13x9x0.1] [25:14x7x0.1] Measurements L x W x D (cm) [23:4.712] [24:91.892] [25:76.969] A (cm) : rea [23:0.942] [24:9.189] [54:6.503] Volume (cm) : [23:39.40%] [24:76.80%] [25:81.50%] % Reduction in Area: [23:87.90%] [24:76.80%] [25:81.50%] % Reduction in Volume: [23:Category/Stage IV] [24:Full Thickness Without Exposed] [25:Full Thickness Without Exposed] Classification: [23:Medium] [24:Support Structures Large] [25:Support Structures Large] Exudate Amount: [23:Serosanguineous] [24:Serous] [25:Serous] Exudate Type: [23:red, brown] [24:amber] [25:amber] Exudate Color: [23:Well defined, not attached] [24:Flat and Intact] [25:Flat and Intact] Wound Margin: [23:Large (67-100%)] [24:Large (67-100%)] [25:Large (67-100%)] Granulation Amount: [23:Red] [24:Pink, Hyper-granulation] [25:Pink, Hyper-granulation] Granulation Quality: [23:Small (1-33%)] [24:None Present (0%)] [25:None Present (0%)] Necrotic Amount: [23:Fat Layer (Subcutaneous Tissue): Yes Fat Layer (Subcutaneous Tissue): Yes Fat Layer (Subcutaneous Tissue): Yes] Exposed Structures: [23:Fascia: No Tendon: No Muscle: No Joint: No Bone: No Small (1-33%)] [24:Fascia: No Tendon: No Muscle: No Joint: No Bone: No Small  (1-33%)] [25:Fascia: No Tendon: No Muscle: No Joint: No Bone: No Small (1-33%)] Wound Number: '[id number] ' Photos: No Photos No Photos No Photos Right Metatarsal head first Left Calcaneus Right T Great oe Wound Location: Pressure Injury Pressure Injury Pressure Injury Wounding Event: Pressure Ulcer Pressure Ulcer Pressure Ulcer Primary Etiology: Anemia, Hypertension, Type II Anemia, Hypertension, Type II Anemia, Hypertension, Type II Comorbid History: Diabetes, Paraplegia Diabetes, Paraplegia Diabetes, Paraplegia 11/08/2020 11/08/2020 12/27/2020 Date Acquired: 7 7 0 Weeks of Treatment: Healed - Epithelialized Open Open Wound Status: 0x0x0 0x0x0 2x1.5x0.1 Measurements L x W x D (cm) 0 0 2.356 A (cm) : rea 0 0 0.236 Volume (cm) : 100.00% 100.00% N/A % Reduction in A rea: 100.00% 100.00% N/A % Reduction in Volume: Category/Stage II Category/Stage III Category/Stage II Classification: None Present None Present Medium Exudate A mount: N/A N/A Serosanguineous Exudate Type: N/A N/A red, brown Exudate Color: Flat and Intact Flat and Intact Flat and Intact Wound Margin: None Present (0%) None Present (0%) Large (67-100%) Granulation A mount: N/A N/A Pink Granulation Quality: None Present (0%) None Present (0%) None Present (0%) Necrotic A mount: Fascia: No Fascia: No Fat Layer (Subcutaneous Tissue): Yes Exposed Structures: Fat Layer (Subcutaneous Tissue): No Fat Layer (Subcutaneous Tissue): No Fascia: No Tendon: No Tendon: No Tendon: No Muscle: No Muscle: No Muscle: No Joint: No Joint: No Joint: No Bone: No Bone: No Bone: No Large (67-100%) Large (67-100%) None Epithelialization: Treatment Notes Electronic Signature(s) Signed: 12/27/2020 4:30:32 PM By: Linton Ham MD Signed: 12/27/2020 4:51:20 PM By: Levan Hurst RN, BSN Entered By: Linton Ham on 12/27/2020  11:03:34 -------------------------------------------------------------------------------- Multi-Disciplinary Care Plan Details Patient Name: Date of Service: [name] [name], [name] RD R. 12/27/2020 10:45 A M Medical Record Number: [id number] Patient Account Number: [id number] Date of Birth/Sex: Treating RN: 01/11/1946 (75 y.o. [name] Primary Care Vasti Yagi: Theodora Blow, MIKE Other Clinician: Referring Verlisa Vara: Treating Chariah Bailey/Extender: Joanne Chars, MIKE Weeks in Treatment: [place] reviewed with physician Active Inactive Wound/Skin Impairment Nursing Diagnoses: Knowledge deficit related to ulceration/compromised skin integrity Goals: Patient/caregiver will verbalize understanding of skin care regimen Date Initiated: 05/20/2019 Target Resolution Date: 01/27/2021 Goal Status: Active Ulcer/skin breakdown will have a volume reduction of 30% by week 4 Date Initiated: 12/31/2017 Date Inactivated: 05/20/2019 Target Resolution Date: 06/18/2018 Goal Status: Met Interventions: Assess patient/caregiver ability to obtain necessary supplies Assess patient/caregiver ability to perform ulcer/skin care regimen upon admission and as needed Assess ulceration(s) every visit Notes: Electronic Signature(s) Signed: 12/27/2020 4:51:20 PM By: Levan Hurst RN, BSN Entered By: Levan Hurst on 12/27/2020 16:25:35 -------------------------------------------------------------------------------- Pain Assessment Details Patient Name:  Date of Service: [name] [name], [name] RD R. 12/27/2020 10:45 A M Medical Record Number: [id number] Patient Account Number: [id number] Date of Birth/Sex: Treating RN: 16-Aug-1945 (75 y.o. [name] Primary Care Linsy Ehresman: Theodora Blow, MIKE Other Clinician: Referring Codee Tutson: Treating Memorie Yokoyama/Extender: Joanne Chars, MIKE Weeks in Treatment: 156 Active Problems Location of Pain Severity and Description of Pain Patient Has Paino  No Site Locations Pain Management and Medication Current Pain Management: Electronic Signature(s) Signed: 12/27/2020 4:51:20 PM By: Levan Hurst RN, BSN Entered By: Levan Hurst on 12/27/2020 10:46:41 -------------------------------------------------------------------------------- Patient/Caregiver Education Details Patient Name: Date of Service: [name] Schultze RD R. 12/13/2022andnbsp10:45 A M Medical Record Number: [id number] Patient Account Number: [id number] Date of Birth/Gender: Treating RN: [birth year] (75 y.o. [name] Primary Care Physician: Theodora Blow, MIKE Other Clinician: Referring Physician: Treating Physician/Extender: Joanne Chars, MIKE Weeks in Treatment: 156 Education Assessment Education Provided To: Patient Education Topics Provided Wound/Skin Impairment: Methods: Explain/Verbal Responses: State content correctly Electronic Signature(s) Signed: 12/27/2020 4:51:20 PM By: Levan Hurst RN, BSN Entered By: Levan Hurst on 12/27/2020 16:25:46 -------------------------------------------------------------------------------- Wound Assessment Details Patient Name: Date of Service: [name] [name], [name] RD R. 12/27/2020 10:45 A M Medical Record Number: [id number] Patient Account Number: [id number] Date of Birth/Sex: Treating RN: [birth year] (75 y.o. [name] Primary Care Nevena Rozenberg: Theodora Blow, MIKE Other Clinician: Referring Garry Nicolini: Treating Royer Cristobal/Extender: Joanne Chars, MIKE Weeks in Treatment: 156 Wound Status Wound Number: 23 Primary Etiology: Pressure Ulcer Wound Location: Right Elbow Wound Status: Open Wounding Event: Pressure Injury Comorbid History: Anemia, Hypertension, Type II Diabetes, Paraplegia Date Acquired: 05/26/2020 Weeks Of Treatment: 30 Clustered Wound: No Wound Measurements Length: (cm) 2.5 Width: (cm) 2.4 Depth: (cm) 0.2 Area: (cm) 4.712 Volume: (cm) 0.942 % Reduction in Area: 39.4% %  Reduction in Volume: 87.9% Epithelialization: Small (1-33%) Wound Description Classification: Category/Stage IV Wound Margin: Well defined, not attached Exudate Amount: Medium Exudate Type: Serosanguineous Exudate Color: red, brown Foul Odor After Cleansing: No Slough/Fibrino Yes Wound Bed Granulation Amount: Large (67-100%) Exposed Structure Granulation Quality: Red Fascia Exposed: No Necrotic Amount: Small (1-33%) Fat Layer (Subcutaneous Tissue) Exposed: Yes Necrotic Quality: Adherent Slough Tendon Exposed: No Muscle Exposed: No Joint Exposed: No Bone Exposed: No Treatment Notes Wound #23 (Elbow) Wound Laterality: Right Cleanser Wound Cleanser Discharge Instruction: Cleanse the wound with wound cleanser prior to applying a clean dressing using gauze sponges, not tissue or cotton balls. Peri-Wound Care Topical Primary Dressing Hydrofera Blue Ready Foam, 2.5 x2.5 in Discharge Instruction: Apply to wound bed as instructed Secondary Dressing Bordered Gauze, 4x4 in Discharge Instruction: Apply over primary dressing as directed. Secured With Compression Wrap Compression Stockings Environmental education officer) Signed: 12/27/2020 4:51:20 PM By: Levan Hurst RN, BSN Entered By: Levan Hurst on 12/27/2020 10:52:29 -------------------------------------------------------------------------------- Wound Assessment Details Patient Name: Date of Service: [name] [name], [name] RD R. 12/27/2020 10:45 A M Medical Record Number: [id number] Patient Account Number: [id number] Date of Birth/Sex: Treating RN: September 03, 1945 (75 y.o. [name] Primary Care Wilmetta Speiser: Theodora Blow, MIKE Other Clinician: Referring Agostino Gorin: Treating Liller Yohn/Extender: Joanne Chars, MIKE Weeks in Treatment: 156 Wound Status Wound Number: 24 Primary Etiology: Lymphedema Wound Location: Left, Anterior Lower Leg Wound Status: Open Wounding Event: Gradually Appeared Comorbid History: Anemia,  Hypertension, Type II Diabetes, Paraplegia Date Acquired: 09/14/2020 Weeks Of Treatment: 14 Clustered Wound: No Wound Measurements Length: (cm) 13 Width: (cm) 9 Depth: (cm) 0.1 Area: (cm) 91.892 Volume: (cm) 9.189 % Reduction in Area: 76.8% % Reduction in Volume: 76.8% Epithelialization: Small (1-33%) Tunneling: No Undermining:  No Wound Description Classification: Full Thickness Without Exposed Support Structures Wound Margin: Flat and Intact Exudate Amount: Large Exudate Type: Serous Exudate Color: amber Foul Odor After Cleansing: No Slough/Fibrino No Wound Bed Granulation Amount: Large (67-100%) Exposed Structure Granulation Quality: Pink, Hyper-granulation Fascia Exposed: No Necrotic Amount: None Present (0%) Fat Layer (Subcutaneous Tissue) Exposed: Yes Tendon Exposed: No Muscle Exposed: No Joint Exposed: No Bone Exposed: No Treatment Notes Wound #24 (Lower Leg) Wound Laterality: Left, Anterior Cleanser Soap and Water Discharge Instruction: May shower and wash wound with dial antibacterial soap and water prior to dressing change. Peri-Wound Care Topical Triamcinolone Discharge Instruction: Apply Triamcinolone to periwound and blistered areas Primary Dressing Secondary Dressing ABD Pad, 8x10 Discharge Instruction: Apply over primary dressing as directed. Secured With [employer], 4.5x3.1 (in/yd) Discharge Instruction: Secure with Kerlix as directed. 61M Medipore Soft Cloth Surgical T 2x10 (in/yd) ape Discharge Instruction: Secure with tape as directed. Compression Wrap Compression Stockings Add-Ons Electronic Signature(s) Signed: 12/27/2020 4:51:20 PM By: Levan Hurst RN, BSN Entered By: Levan Hurst on 12/27/2020 10:52:42 -------------------------------------------------------------------------------- Wound Assessment Details Patient Name: Date of Service: [name] [name], [name] RD R. 12/27/2020 10:45 A M Medical Record Number: [id number] Patient  Account Number: [id number] Date of Birth/Sex: Treating RN: [birth year] (75 y.o. [name] Primary Care Tiphany Fayson: Theodora Blow, MIKE Other Clinician: Referring Verle Brillhart: Treating Donnel Venuto/Extender: Joanne Chars, MIKE Weeks in Treatment: 156 Wound Status Wound Number: 25 Primary Etiology: Lymphedema Wound Location: Right, Anterior Lower Leg Wound Status: Open Wounding Event: Gradually Appeared Comorbid History: Anemia, Hypertension, Type II Diabetes, Paraplegia Date Acquired: 09/14/2020 Weeks Of Treatment: 14 Clustered Wound: No Wound Measurements Length: (cm) 14 Width: (cm) 7 Depth: (cm) 0.1 Area: (cm) 76.969 Volume: (cm) 7.697 % Reduction in Area: 81.5% % Reduction in Volume: 81.5% Epithelialization: Small (1-33%) Tunneling: No Undermining: No Wound Description Classification: Full Thickness Without Exposed Support Structures Wound Margin: Flat and Intact Exudate Amount: Large Exudate Type: Serous Exudate Color: amber Foul Odor After Cleansing: No Slough/Fibrino No Wound Bed Granulation Amount: Large (67-100%) Exposed Structure Granulation Quality: Pink, Hyper-granulation Fascia Exposed: No Necrotic Amount: None Present (0%) Fat Layer (Subcutaneous Tissue) Exposed: Yes Tendon Exposed: No Muscle Exposed: No Joint Exposed: No Bone Exposed: No Treatment Notes Wound #25 (Lower Leg) Wound Laterality: Right, Anterior Cleanser Soap and Water Discharge Instruction: May shower and wash wound with dial antibacterial soap and water prior to dressing change. Peri-Wound Care Topical Triamcinolone Discharge Instruction: Apply Triamcinolone to periwound and blistered areas Primary Dressing Secondary Dressing ABD Pad, 8x10 Discharge Instruction: Apply over primary dressing as directed. Secured With [employer], 4.5x3.1 (in/yd) Discharge Instruction: Secure with Kerlix as directed. 61M Medipore Soft Cloth Surgical T 2x10 (in/yd) ape Discharge  Instruction: Secure with tape as directed. Compression Wrap Compression Stockings Add-Ons Electronic Signature(s) Signed: 12/27/2020 4:51:20 PM By: Levan Hurst RN, BSN Entered By: Levan Hurst on 12/27/2020 10:52:53 -------------------------------------------------------------------------------- Wound Assessment Details Patient Name: Date of Service: [name] [name], [name] RD R. 12/27/2020 10:45 A M Medical Record Number: [id number] Patient Account Number: [id number] Date of Birth/Sex: Treating RN: [birth year] (75 y.o. [name] Primary Care Wilgus Deyton: Theodora Blow, MIKE Other Clinician: Referring Lejend Dalby: Treating Kamoni Depree/Extender: Joanne Chars, MIKE Weeks in Treatment: 156 Wound Status Wound Number: 26 Primary Etiology: Pressure Ulcer Wound Location: Right Metatarsal head first Wound Status: Healed - Epithelialized Wounding Event: Pressure Injury Comorbid History: Anemia, Hypertension, Type II Diabetes, Paraplegia Date Acquired: 11/08/2020 Weeks Of Treatment: 7 Clustered Wound: No Wound Measurements Length: (cm) Width: (cm) Depth: (cm)  Area: (cm) Volume: (cm) 0 % Reduction in Area: 100% 0 % Reduction in Volume: 100% 0 Epithelialization: Large (67-100%) 0 Tunneling: No 0 Undermining: No Wound Description Classification: Category/Stage II Wound Margin: Flat and Intact Exudate Amount: None Present Foul Odor After Cleansing: No Slough/Fibrino No Wound Bed Granulation Amount: None Present (0%) Exposed Structure Necrotic Amount: None Present (0%) Fascia Exposed: No Fat Layer (Subcutaneous Tissue) Exposed: No Tendon Exposed: No Muscle Exposed: No Joint Exposed: No Bone Exposed: No Electronic Signature(s) Signed: 12/27/2020 4:51:20 PM By: Levan Hurst RN, BSN Entered By: Levan Hurst on 12/27/2020 10:53:11 -------------------------------------------------------------------------------- Wound Assessment Details Patient Name: Date of  Service: [name] [name], [name] RD R. 12/27/2020 10:45 A M Medical Record Number: [id number] Patient Account Number: [id number] Date of Birth/Sex: Treating RN: [birth year] (75 y.o. [name] Primary Care Kevis Qu: Theodora Blow, MIKE Other Clinician: Referring Jadelin Eng: Treating Kein Carlberg/Extender: Joanne Chars, MIKE Weeks in Treatment: 156 Wound Status Wound Number: 27 Primary Etiology: Pressure Ulcer Wound Location: Left Calcaneus Wound Status: Open Wounding Event: Pressure Injury Comorbid History: Anemia, Hypertension, Type II Diabetes, Paraplegia Date Acquired: 11/08/2020 Weeks Of Treatment: 7 Clustered Wound: No Wound Measurements Length: (cm) Width: (cm) Depth: (cm) Area: (cm) Volume: (cm) 0 % Reduction in Area: 100% 0 % Reduction in Volume: 100% 0 Epithelialization: Large (67-100%) 0 Tunneling: No 0 Undermining: No Wound Description Classification: Category/Stage III Wound Margin: Flat and Intact Exudate Amount: None Present Foul Odor After Cleansing: No Slough/Fibrino No Wound Bed Granulation Amount: None Present (0%) Exposed Structure Necrotic Amount: None Present (0%) Fascia Exposed: No Fat Layer (Subcutaneous Tissue) Exposed: No Tendon Exposed: No Muscle Exposed: No Joint Exposed: No Bone Exposed: No Electronic Signature(s) Signed: 12/27/2020 4:51:20 PM By: Levan Hurst RN, BSN Entered By: Levan Hurst on 12/27/2020 10:53:27 -------------------------------------------------------------------------------- Wound Assessment Details Patient Name: Date of Service: [name] [name], [name] RD R. 12/27/2020 10:45 A M Medical Record Number: [id number] Patient Account Number: [id number] Date of Birth/Sex: Treating RN: [birth year] (74 y.o. [name] Primary Care Anddy Wingert: Theodora Blow, MIKE Other Clinician: Referring Breelyn Icard: Treating Akin Yi/Extender: Joanne Chars, MIKE Weeks in Treatment: 156 Wound Status Wound Number: [id number] Primary  Etiology: Pressure Ulcer Wound Location: Right T Great oe Wound Status: Open Wounding Event: Pressure Injury Comorbid History: Anemia, Hypertension, Type II Diabetes, Paraplegia Date Acquired: 12/27/2020 Weeks Of Treatment: 0 Clustered Wound: No Wound Measurements Length: (cm) 2 Width: (cm) 1.5 Depth: (cm) 0.1 Area: (cm) 2.356 Volume: (cm) 0.236 % Reduction in Area: % Reduction in Volume: Epithelialization: None Tunneling: No Undermining: No Wound Description Classification: Category/Stage II Wound Margin: Flat and Intact Exudate Amount: Medium Exudate Type: Serosanguineous Exudate Color: red, brown Foul Odor After Cleansing: No Slough/Fibrino No Wound Bed Granulation Amount: Large (67-100%) Exposed Structure Granulation Quality: Pink Fascia Exposed: No Necrotic Amount: None Present (0%) Fat Layer (Subcutaneous Tissue) Exposed: Yes Tendon Exposed: No Muscle Exposed: No Joint Exposed: No Bone Exposed: No Treatment Notes Wound #28 (Toe Great) Wound Laterality: Right Cleanser Wound Cleanser Discharge Instruction: Cleanse the wound with wound cleanser prior to applying a clean dressing using gauze sponges, not tissue or cotton balls. Peri-Wound Care Topical Triamcinolone Discharge Instruction: Apply Triamcinolone as directed Primary Dressing Secondary Dressing Woven Gauze Sponges 2x2 in Discharge Instruction: Apply over primary dressing as directed. Secured With Conforming Stretch Gauze Bandage, Sterile 2x75 (in/in) Discharge Instruction: Secure with stretch gauze as directed. Compression Wrap Compression Stockings Add-Ons Electronic Signature(s) Signed: 12/27/2020 4:51:20 PM By: Levan Hurst RN, BSN Entered By: Levan Hurst on 12/27/2020 10:49:20 -------------------------------------------------------------------------------- Vitals  Details Patient Name: Date of Service: [name] [name], [name] RD R. 12/27/2020 10:45 A M Medical Record Number:  [id number] Patient Account Number: [id number] Date of Birth/Sex: Treating RN: 01-Dec-1945 (75 y.o. [name] Primary Care Rohnan Bartleson: Other Clinician: Theodora Blow, MIKE Referring Dhanya Bogle: Treating Stefanie Hodgens/Extender: Joanne Chars, MIKE Weeks in Treatment: 156 Vital Signs Time Taken: 10:45 Temperature (F): 97.9 Height (in): 67 Pulse (bpm): 91 Weight (lbs): 130 Respiratory Rate (breaths/min): 18 Body Mass Index (BMI): 20.4 Blood Pressure (mmHg): 134/82 Reference Range: 80 - 120 mg / dl Electronic Signature(s) Signed: 12/27/2020 4:51:20 PM By: Levan Hurst RN, BSN Entered By: Levan Hurst on 12/27/2020 10:45:52

## 2020-12-27 NOTE — Progress Notes (Signed)
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 The wound margin is flat and intact. There is no granulation within the wound bed. There is no necrotic tissue within the wound bed. Wound #28 status is Open. Original cause of wound was Pressure Injury. The date acquired was: 12/27/2020. The wound is located on the Right T Great. The oe wound measures  2cm length x 1.5cm width x 0.1cm depth; 2.356cm^2 area and 0.236cm^3 volume. There is Fat Layer (Subcutaneous Tissue) exposed. There is no tunneling or undermining noted. There is a medium amount of serosanguineous drainage noted. The wound margin is flat and intact. There is large (67- 100%) pink granulation within the wound bed. There is no necrotic tissue within the wound bed. 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 Dellia Nims - ****75 MINUTES - STRETCHER**** Bathing/ Shower/ Hygiene: May shower and wash wound with soap and water. - wash and gently scrub both legs prior to apply bandage Edema Control - Lymphedema / SCD / Other: Elevate legs to the level of the heart or  above for 30 minutes daily and/or when sitting, a frequency of: - throughout the day Off-Loading: Turn and reposition every 2 hours WOUND #23: - Elbow Wound Laterality: Right Cleanser: Wound Cleanser (Generic) Every Other Day/30 Days Discharge Instructions: Cleanse the wound with wound cleanser prior to applying a clean dressing using gauze sponges, not tissue or cotton balls. Prim Dressing: Hydrofera Blue Ready Foam, 2.5 x2.5 in (Generic) Every Other Day/30 Days ary Discharge Instructions: Apply to wound bed as instructed Secondary Dressing: Bordered Gauze, 4x4 in (Generic) Every Other Day/30 Days Discharge Instructions: Apply over primary dressing as directed. WOUND #24: - Lower Leg Wound Laterality: Left, Anterior Cleanser: Soap and Water 1 x Per  Day/30 Days Discharge Instructions: May shower and wash wound with dial antibacterial soap and water prior to dressing change. Topical: Triamcinolone 1 x Per Day/30 Days Discharge Instructions: Apply Triamcinolone to periwound and blistered areas Secondary Dressing: ABD Pad, 8x10 (Generic) 1 x Per Day/30 Days Discharge Instructions: Apply over primary dressing as directed. Secured With: [employer], 4.5x3.1 (in/yd) (Generic) 1 x Per Day/30 Days Discharge Instructions: Secure with Kerlix as directed. Secured With: 34M Medipore Public affairs consultant Surgical T 2x10 (in/yd) (Generic) 1 x Per Day/30 Days ape Discharge Instructions: Secure with tape as directed. WOUND #25: - Lower Leg Wound Laterality: Right, Anterior Cleanser: Soap and Water 1 x Per Day/30 Days Discharge Instructions: May shower and wash wound with dial antibacterial soap and water prior to dressing change. Topical: Triamcinolone 1 x Per Day/30 Days Discharge Instructions: Apply Triamcinolone to periwound and blistered areas Secondary Dressing: ABD Pad, 8x10 (Generic) 1 x Per Day/30 Days Discharge Instructions: Apply over primary dressing as directed. Secured With: [employer], 4.5x3.1 (in/yd) (Generic) 1 x Per Day/30 Days Discharge Instructions: Secure with Kerlix as directed. Secured With: 34M [employer] Public affairs consultant Surgical T 2x10 (in/yd) 1 x Per Day/30 Days ape Discharge Instructions: Secure with tape as directed. WOUND #28: - T Great Wound Laterality: Right oe Cleanser: Wound Cleanser 1 x Per Day/30 Days Discharge Instructions: Cleanse the wound with wound cleanser prior to applying a clean dressing using gauze sponges, not tissue or cotton balls. Topical: Triamcinolone 1 x Per Day/30 Days Discharge Instructions: Apply Triamcinolone as directed Secondary Dressing: Woven Gauze Sponges 2x2 in 1 x Per Day/30 Days Discharge Instructions: Apply over primary dressing as directed. Secured With: Media planner, Sterile 2x75 (in/in) 1 x Per Day/30 Days Discharge Instructions: Secure with stretch gauze as directed. 1. We continue with Hydrofera Blue to the right elbow 2. I am not sure about the swelling in his right arm. A lot of this could be dependent is now tightly contractured and I do not remember this. Not a lot of suggestion of a DVT or cellulitis. 3. Continues with innumerable firm nodular blisters on the anterior part of both legs. The exact etiology of this is not clear. 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 Sees dermatology tomorrow Electronic Signature(s) Signed: 12/27/2020 4:30:32 PM By: Linton Ham MD Entered By: Linton Ham on 12/27/2020 11:15:34 -------------------------------------------------------------------------------- SuperBill Details Patient Name: Date of Service: [name] [name] RD R. 12/27/2020 Medical Record Number: [id number] Patient Account Number: [id number] Date of Birth/Sex: Treating RN: 12-Sep-1945 (75 y.o. 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 L89.622 Pressure ulcer of left heel, stage 2 L97.518 Non-pressure chronic ulcer of other part of right foot with other specified severity Facility Procedures CPT4 Code: [id number] Description: 09198 - WOUND CARE  VISIT-LEV 5 EST PT Modifier: Quantity: 1 Physician Procedures : CPT4 Code Description Modifier [id number] - WC PHYS LEVEL 3 - EST PT ICD-10 Diagnosis Description L89.016 Pressure-induced deep tissue damage of right elbow S80.829D Blister (nonthermal), unspecified lower leg, subsequent encounter L97.518  Non-pressure chronic ulcer of other part of right foot with other specified severity Quantity: 1 Electronic Signature(s) Signed: 12/27/2020 4:30:32 PM By: Linton Ham MD Signed: 12/27/2020 4:51:20 PM By: Levan Hurst RN, BSN Entered By: Levan Hurst on 12/27/2020 16:26:38

## 2021-01-12 ENCOUNTER — Inpatient Hospital Stay (HOSPITAL_COMMUNITY)
Admission: EM | Admit: 2021-01-12 | Discharge: 2021-01-24 | DRG: 070 | Disposition: A | Discharge status: Skilled Nursing Facility | Payer: Medicare Other | Attending: Internal Medicine | Admitting: Internal Medicine

## 2021-01-12 ENCOUNTER — Encounter (HOSPITAL_COMMUNITY): Payer: Self-pay

## 2021-01-12 ENCOUNTER — Emergency Department (HOSPITAL_COMMUNITY): Payer: Medicare Other

## 2021-01-12 ENCOUNTER — Other Ambulatory Visit: Payer: Self-pay

## 2021-01-12 DIAGNOSIS — E872 Acidosis, unspecified: Secondary | ICD-10-CM | POA: Diagnosis present

## 2021-01-12 DIAGNOSIS — R4781 Slurred speech: Secondary | ICD-10-CM | POA: Diagnosis present

## 2021-01-12 DIAGNOSIS — R4182 Altered mental status, unspecified: Secondary | ICD-10-CM | POA: Diagnosis not present

## 2021-01-12 DIAGNOSIS — L03115 Cellulitis of right lower limb: Secondary | ICD-10-CM

## 2021-01-12 DIAGNOSIS — E785 Hyperlipidemia, unspecified: Secondary | ICD-10-CM | POA: Diagnosis present

## 2021-01-12 DIAGNOSIS — L24A9 Irritant contact dermatitis due friction or contact with other specified body fluids: Secondary | ICD-10-CM

## 2021-01-12 DIAGNOSIS — J9811 Atelectasis: Secondary | ICD-10-CM | POA: Diagnosis present

## 2021-01-12 DIAGNOSIS — G459 Transient cerebral ischemic attack, unspecified: Secondary | ICD-10-CM

## 2021-01-12 DIAGNOSIS — N179 Acute kidney failure, unspecified: Secondary | ICD-10-CM | POA: Diagnosis present

## 2021-01-12 DIAGNOSIS — I10 Essential (primary) hypertension: Secondary | ICD-10-CM | POA: Diagnosis present

## 2021-01-12 DIAGNOSIS — G8220 Paraplegia, unspecified: Secondary | ICD-10-CM | POA: Diagnosis present

## 2021-01-12 DIAGNOSIS — B9689 Other specified bacterial agents as the cause of diseases classified elsewhere: Secondary | ICD-10-CM | POA: Diagnosis not present

## 2021-01-12 DIAGNOSIS — R0602 Shortness of breath: Secondary | ICD-10-CM

## 2021-01-12 DIAGNOSIS — L89153 Pressure ulcer of sacral region, stage 3: Secondary | ICD-10-CM | POA: Diagnosis present

## 2021-01-12 DIAGNOSIS — Z79899 Other long term (current) drug therapy: Secondary | ICD-10-CM

## 2021-01-12 DIAGNOSIS — E119 Type 2 diabetes mellitus without complications: Secondary | ICD-10-CM

## 2021-01-12 DIAGNOSIS — Z89211 Acquired absence of right upper limb below elbow: Secondary | ICD-10-CM

## 2021-01-12 DIAGNOSIS — T07XXXA Unspecified multiple injuries, initial encounter: Secondary | ICD-10-CM | POA: Diagnosis present

## 2021-01-12 DIAGNOSIS — R532 Functional quadriplegia: Secondary | ICD-10-CM | POA: Diagnosis present

## 2021-01-12 DIAGNOSIS — E1151 Type 2 diabetes mellitus with diabetic peripheral angiopathy without gangrene: Secondary | ICD-10-CM | POA: Diagnosis present

## 2021-01-12 DIAGNOSIS — G9341 Metabolic encephalopathy: Principal | ICD-10-CM | POA: Diagnosis present

## 2021-01-12 DIAGNOSIS — L03116 Cellulitis of left lower limb: Secondary | ICD-10-CM | POA: Diagnosis present

## 2021-01-12 DIAGNOSIS — I959 Hypotension, unspecified: Secondary | ICD-10-CM | POA: Diagnosis present

## 2021-01-12 DIAGNOSIS — Z20822 Contact with and (suspected) exposure to covid-19: Secondary | ICD-10-CM | POA: Diagnosis present

## 2021-01-12 DIAGNOSIS — R29810 Facial weakness: Secondary | ICD-10-CM | POA: Diagnosis present

## 2021-01-12 DIAGNOSIS — Z87891 Personal history of nicotine dependence: Secondary | ICD-10-CM

## 2021-01-12 DIAGNOSIS — Z888 Allergy status to other drugs, medicaments and biological substances status: Secondary | ICD-10-CM

## 2021-01-12 DIAGNOSIS — R471 Dysarthria and anarthria: Secondary | ICD-10-CM | POA: Diagnosis present

## 2021-01-12 DIAGNOSIS — Z23 Encounter for immunization: Secondary | ICD-10-CM

## 2021-01-12 DIAGNOSIS — A4189 Other specified sepsis: Secondary | ICD-10-CM | POA: Diagnosis not present

## 2021-01-12 DIAGNOSIS — Z833 Family history of diabetes mellitus: Secondary | ICD-10-CM

## 2021-01-12 DIAGNOSIS — I83009 Varicose veins of unspecified lower extremity with ulcer of unspecified site: Secondary | ICD-10-CM | POA: Diagnosis present

## 2021-01-12 DIAGNOSIS — Z7984 Long term (current) use of oral hypoglycemic drugs: Secondary | ICD-10-CM

## 2021-01-12 DIAGNOSIS — R7881 Bacteremia: Secondary | ICD-10-CM

## 2021-01-12 DIAGNOSIS — Z8616 Personal history of COVID-19: Secondary | ICD-10-CM

## 2021-01-12 DIAGNOSIS — E875 Hyperkalemia: Secondary | ICD-10-CM | POA: Diagnosis present

## 2021-01-12 LAB — DIFFERENTIAL
Abs Immature Granulocytes: 0.09 10*3/uL — ABNORMAL HIGH (ref 0.00–0.07)
Basophils Absolute: 0 10*3/uL (ref 0.0–0.1)
Basophils Relative: 0 %
Eosinophils Absolute: 0.2 10*3/uL (ref 0.0–0.5)
Eosinophils Relative: 2 %
Immature Granulocytes: 1 %
Lymphocytes Relative: 20 %
Lymphs Abs: 2.2 10*3/uL (ref 0.7–4.0)
Monocytes Absolute: 1.1 10*3/uL — ABNORMAL HIGH (ref 0.1–1.0)
Monocytes Relative: 10 %
Neutro Abs: 7.5 10*3/uL (ref 1.7–7.7)
Neutrophils Relative %: 67 %

## 2021-01-12 LAB — ETHANOL: Alcohol, Ethyl (B): <10 mg/dL (ref <10)

## 2021-01-12 LAB — I-STAT CHEM 8, ED
BUN: 35 mg/dL — ABNORMAL HIGH (ref 8–23)
Calcium, Ion: 1.12 mmol/L — ABNORMAL LOW (ref 1.15–1.40)
Chloride: 108 mmol/L (ref 98–111)
Creatinine, Ser: 1.1 mg/dL (ref 0.61–1.24)
Glucose, Bld: 174 mg/dL — ABNORMAL HIGH (ref 70–99)
HCT: 34 % — ABNORMAL LOW (ref 39.0–52.0)
Hemoglobin: 11.6 g/dL — ABNORMAL LOW (ref 13.0–17.0)
Potassium: 4.9 mmol/L (ref 3.5–5.1)
Sodium: 134 mmol/L — ABNORMAL LOW (ref 135–145)
TCO2: 17 mmol/L — ABNORMAL LOW (ref 22–32)

## 2021-01-12 LAB — COMPREHENSIVE METABOLIC PANEL
ALT: 17 U/L (ref 0–44)
AST: 18 U/L (ref 15–41)
Albumin: 2.6 g/dL — ABNORMAL LOW (ref 3.5–5.0)
Alkaline Phosphatase: 88 U/L (ref 38–126)
Anion gap: 8 (ref 5–15)
BUN: 37 mg/dL — ABNORMAL HIGH (ref 8–23)
CO2: 16 mmol/L — ABNORMAL LOW (ref 22–32)
Calcium: 8.4 mg/dL — ABNORMAL LOW (ref 8.9–10.3)
Chloride: 108 mmol/L (ref 98–111)
Creatinine, Ser: 1.32 mg/dL — ABNORMAL HIGH (ref 0.61–1.24)
GFR, Estimated: 56 mL/min — ABNORMAL LOW (ref >60)
Glucose, Bld: 176 mg/dL — ABNORMAL HIGH (ref 70–99)
Potassium: 4.9 mmol/L (ref 3.5–5.1)
Sodium: 132 mmol/L — ABNORMAL LOW (ref 135–145)
Total Bilirubin: 0.7 mg/dL (ref 0.3–1.2)
Total Protein: 6.8 g/dL (ref 6.5–8.1)

## 2021-01-12 LAB — CBC
HCT: 34.4 % — ABNORMAL LOW (ref 39.0–52.0)
Hemoglobin: 10.9 g/dL — ABNORMAL LOW (ref 13.0–17.0)
MCH: 32.2 pg (ref 26.0–34.0)
MCHC: 31.7 g/dL (ref 30.0–36.0)
MCV: 101.5 fL — ABNORMAL HIGH (ref 80.0–100.0)
Platelets: 208 10*3/uL (ref 150–400)
RBC: 3.39 MIL/uL — ABNORMAL LOW (ref 4.22–5.81)
RDW: 13.9 % (ref 11.5–15.5)
WBC: 11 10*3/uL — ABNORMAL HIGH (ref 4.0–10.5)
nRBC: 0 % (ref 0.0–0.2)

## 2021-01-12 LAB — RESP PANEL BY RT-PCR (FLU A&B, COVID) ARPGX2
Influenza A by PCR: NEGATIVE
Influenza B by PCR: NEGATIVE
SARS Coronavirus 2 by RT PCR: NEGATIVE

## 2021-01-12 LAB — PROTIME-INR
INR: 1.2 (ref 0.8–1.2)
Prothrombin Time: 14.9 seconds (ref 11.4–15.2)

## 2021-01-12 LAB — APTT: aPTT: 38 seconds — ABNORMAL HIGH (ref 24–36)

## 2021-01-12 LAB — CBG MONITORING, ED: Glucose-Capillary: 162 mg/dL — ABNORMAL HIGH (ref 70–99)

## 2021-01-12 IMAGING — MR MR MRA NECK W/O CM
6 of 8 series · 24 of 48 positions shown · non-contrast
Comparison: None.

CLINICAL DATA: Transient ischemic attack



[Series 12: tof_fl2d_tra · axial · 3.5mm · 0.86mm/px · z∈[-255,-60]mm · 10 of 100 slices shown]
[im 6/100]
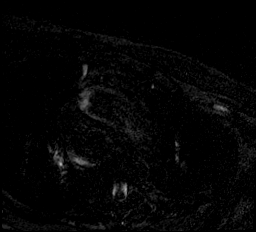
[im 18/100]
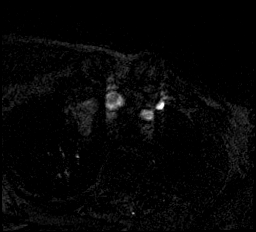
[im 30/100]
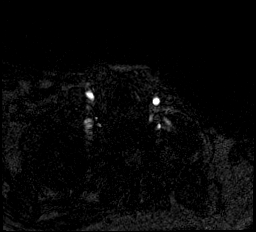
[im 41/100]
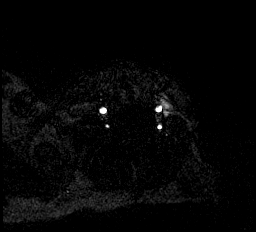
[im 53/100]
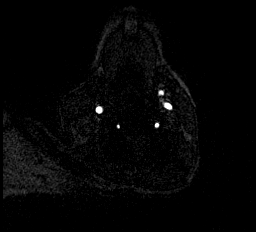
[im 59/100]
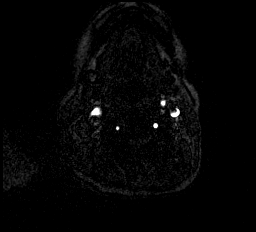
[im 70/100]
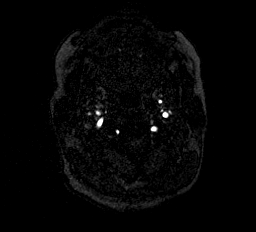
[im 82/100]
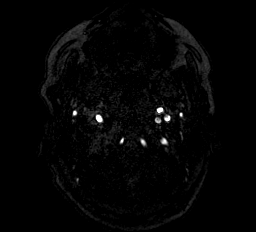
[im 88/100]
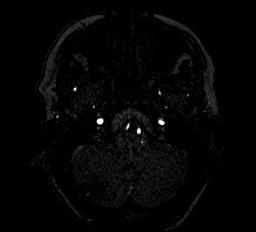
[im 94/100]
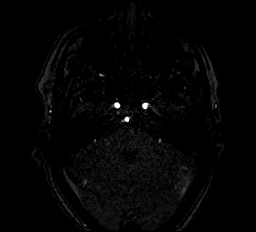

[Series 13: tof_fl3d_tra_iso · axial · 0.6mm · 0.62mm/px · z∈[-166,-96]mm · 10 of 133 slices shown]
[im 6/133]
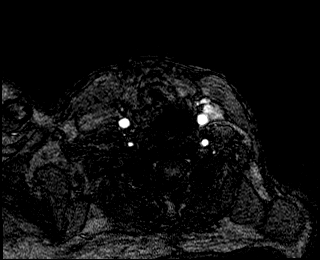
[im 23/133]
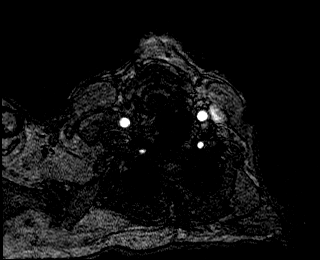
[im 41/133]
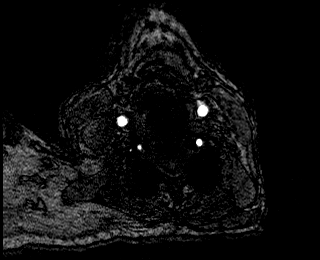
[im 58/133]
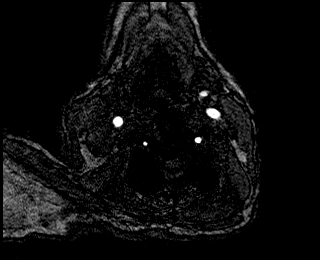
[im 69/133]
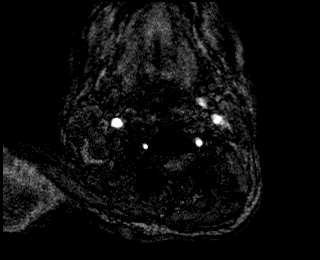
[im 75/133]
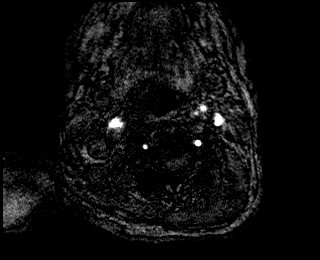
[im 92/133]
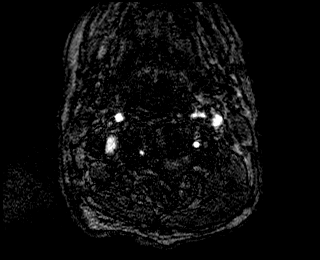
[im 110/133]
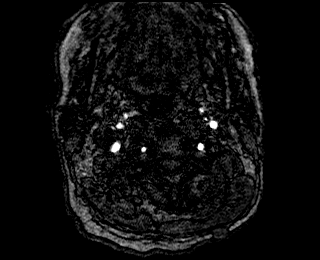
[im 115/133]
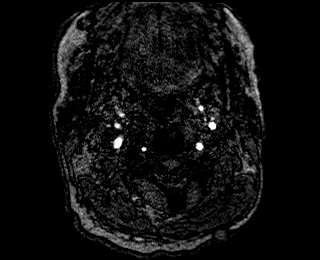
[im 127/133]
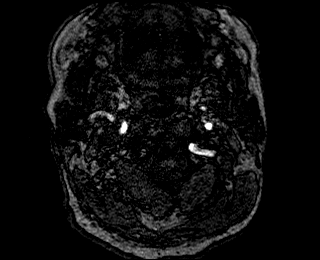

[Series 1003: carotid tumble · axial · 0.9mm · 0.23mm/px · 1 of 3 slices shown]
[im 1/3]
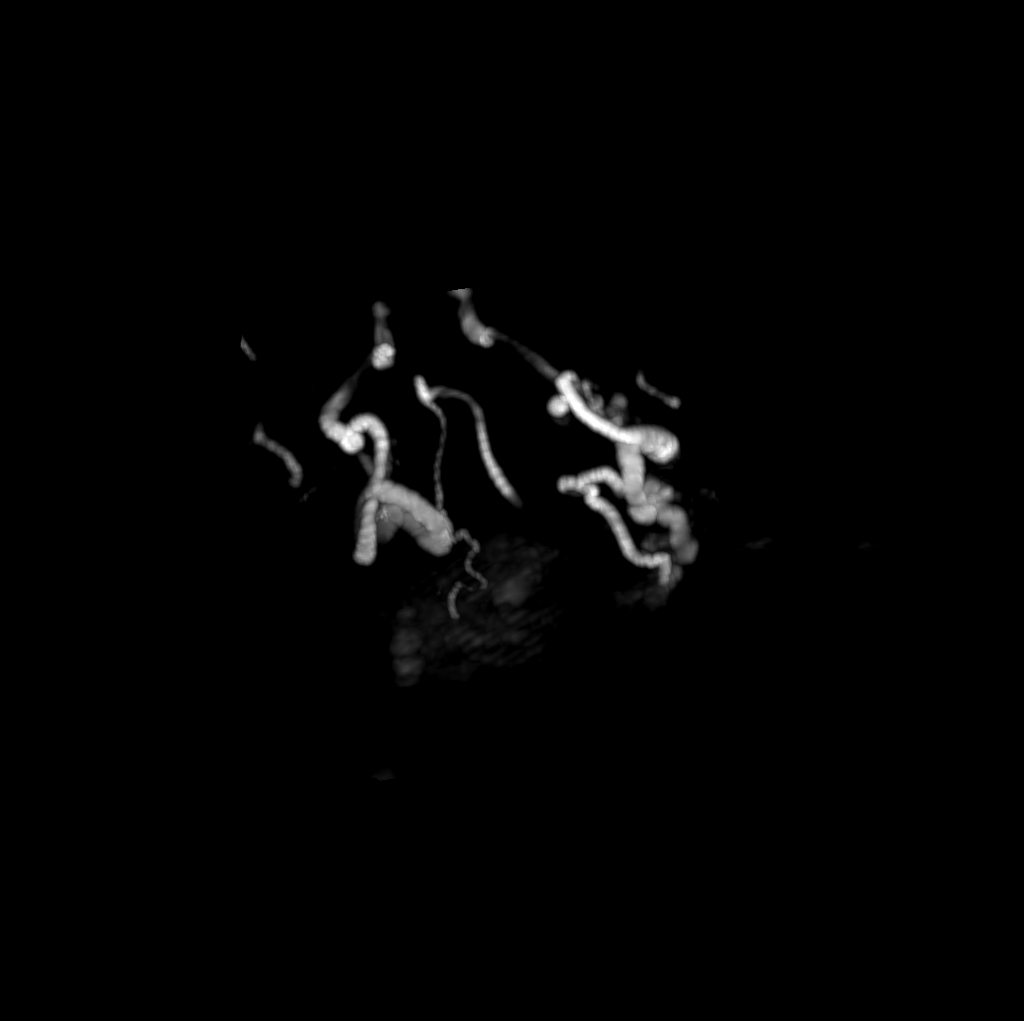

[Series 1003: rt rotate · sagittal · 0.6mm · 0.20mm/px · 1 of 8 slices shown (1 of 2)]
[im 1/8]
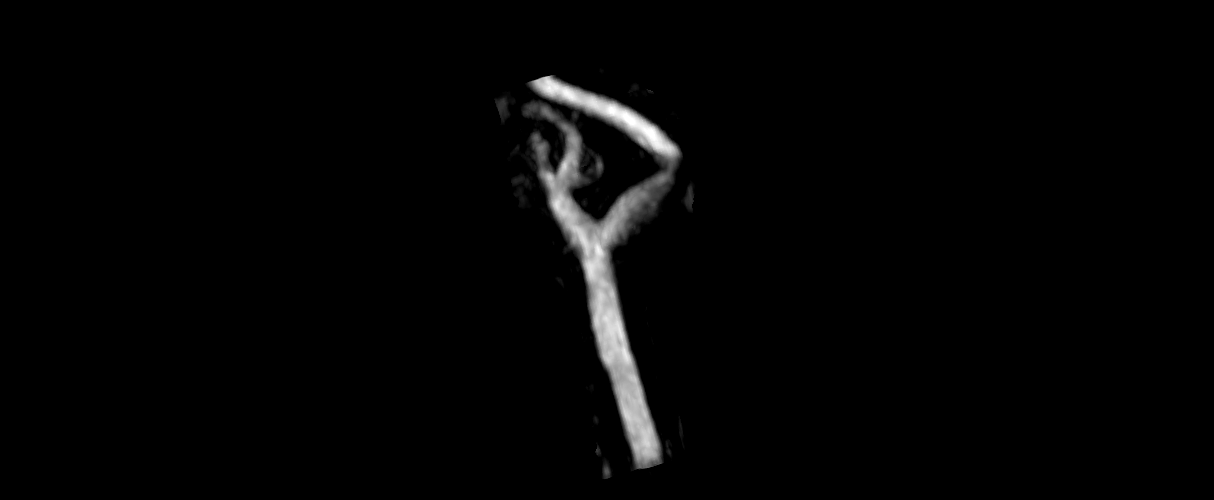

[Series 1009: lt rotate · sagittal · 0.6mm · 0.20mm/px · 1 of 6 slices shown]
[im 1/6]
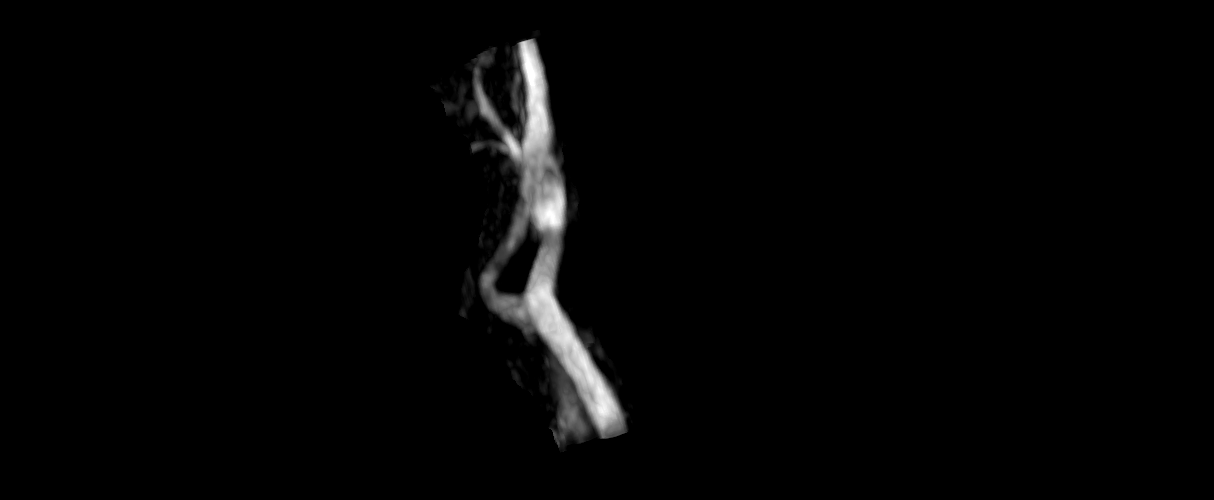

[Series 1019: rt rotate · coronal · 0.9mm · 0.25mm/px · 1 of 3 slices shown (2 of 2)]
[im 1/3]
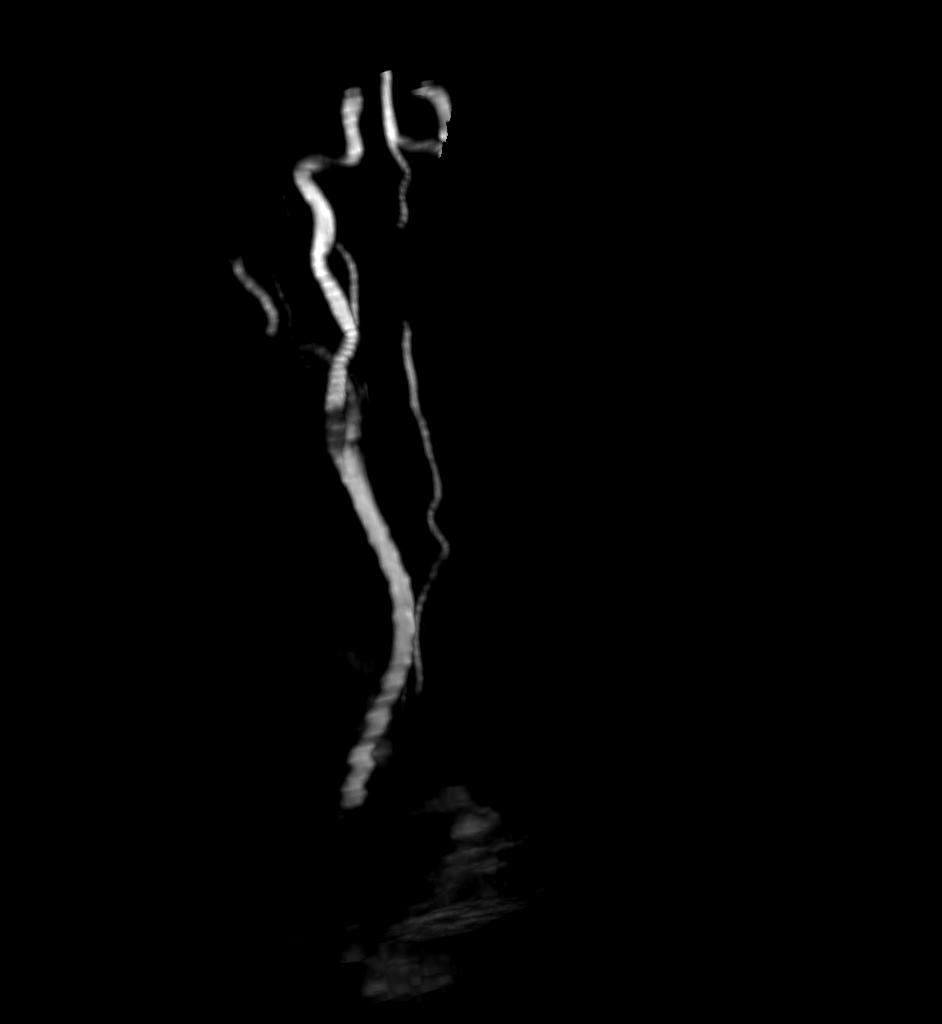

[24 of 48 positions shown; findings below may reference images not displayed]

FINDINGS: MRI HEAD FINDINGS

Brain: No acute infarct, mass effect or extra-axial collection. No
acute or chronic hemorrhage. Hyperintense T2-weighted signal is
moderately widespread throughout the white matter. Generalized
volume loss without a clear lobar predilection. Old bilateral
cerebellar infarcts. The midline structures are normal.

Vascular: Major flow voids are preserved.

Skull and upper cervical spine: Normal calvarium and skull base.
Visualized upper cervical spine and soft tissues are normal.

Sinuses/Orbits:No paranasal sinus fluid levels or advanced mucosal
thickening. No mastoid or middle ear effusion. Normal orbits.

MRA HEAD FINDINGS

POSTERIOR CIRCULATION:

--Vertebral arteries: Normal

--Inferior cerebellar arteries: Normal.

--Basilar artery: Normal.

--Superior cerebellar arteries: Normal.

--Posterior cerebral arteries: Normal.

ANTERIOR CIRCULATION:

--Intracranial internal carotid arteries: Normal.

--Anterior cerebral arteries (ACA): Normal.

--Middle cerebral arteries (MCA): Normal.

ANATOMIC VARIANTS: None

MRA NECK FINDINGS

Left dominant vertebral arteries. Both are patent without stenosis.
No carotid stenosis. Mild motion degradation.
IMPRESSION: 1. No acute intracranial abnormality.
2. Moderate chronic small vessel disease and volume loss.
3. Normal MRA of the head and neck.

## 2021-01-12 IMAGING — MR MR HEAD W/O CM
12 of 13 series · 44 of 48 positions shown · non-contrast
Comparison: None.

CLINICAL DATA: Transient ischemic attack



[Series 5: DWI · axial · 3.0mm · 0.92mm/px · z∈[-98,+65]mm · 7 of 112 slices shown (1 of 4)]
[im 1/112]
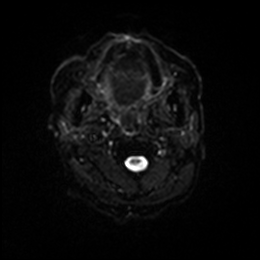
[im 19/112]
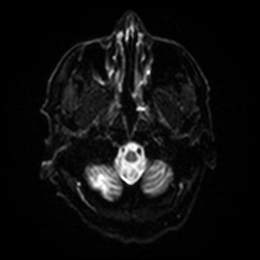
[im 38/112]
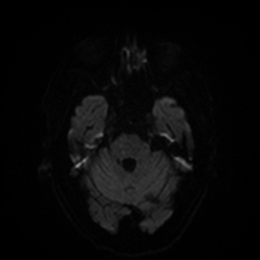
[im 56/112]
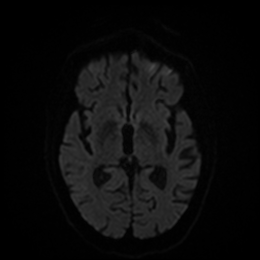
[im 75/112]
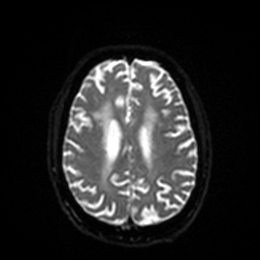
[im 93/112]
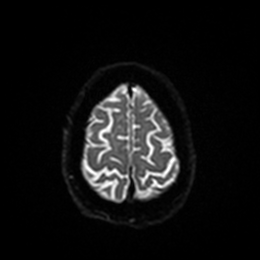
[im 112/112]
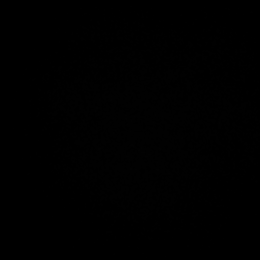

[Series 6: DWI · axial · 3.0mm · 0.92mm/px · z∈[-98,+59]mm · 4 of 55 slices shown (2 of 4)]
[im 1/55]
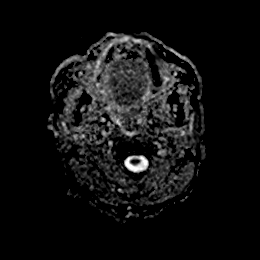
[im 19/55]
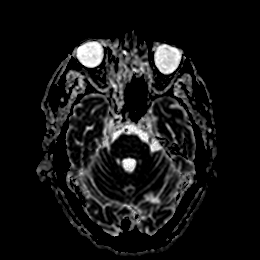
[im 37/55]
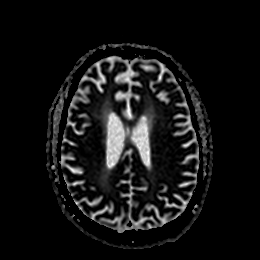
[im 55/55]
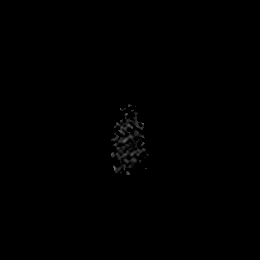

[Series 7: DWI · coronal · 4.0mm · 0.88mm/px · 6 of 87 slices shown (3 of 4)]
[im 1/87]
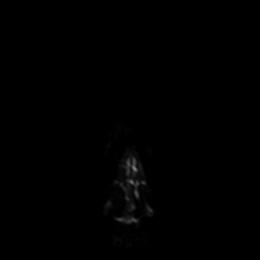
[im 18/87]
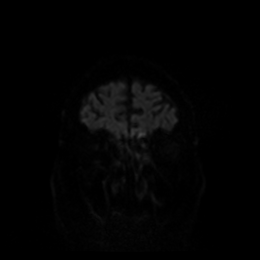
[im 35/87]
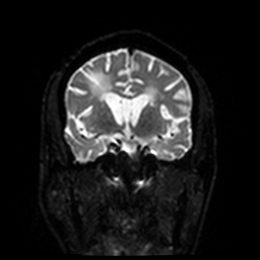
[im 52/87]
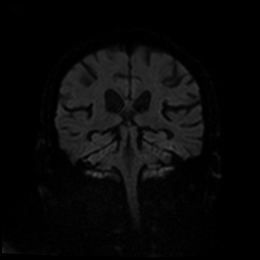
[im 69/87]
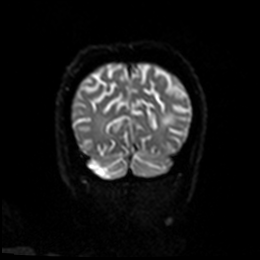
[im 87/87]
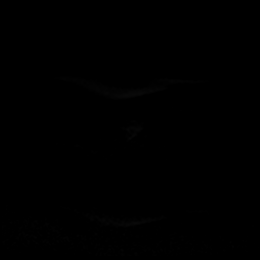

[Series 8: DWI · coronal · 4.0mm · 0.88mm/px · 3 of 43 slices shown (4 of 4)]
[im 1/43]
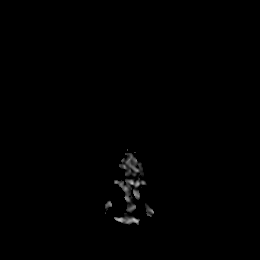
[im 22/43]
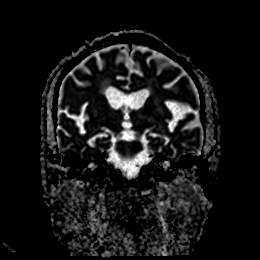
[im 43/43]
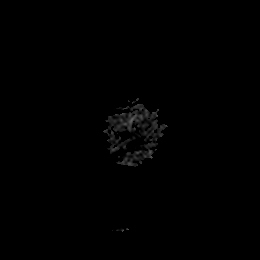

[Series 9: T1 · sagittal · 5.0mm · 0.78mm/px · 2 of 28 slices shown]
[im 1/28]
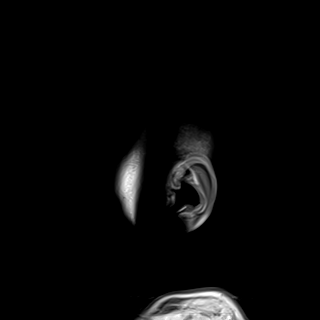
[im 28/28]
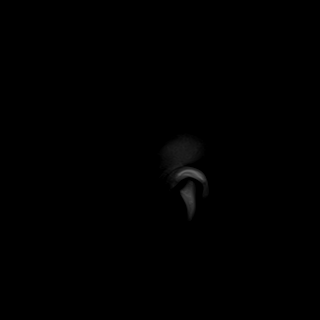

[Series 10: T2 · axial · 5.0mm · 0.75mm/px · z∈[-95,+62]mm · 2 of 28 slices shown (1 of 2)]
[im 1/28]
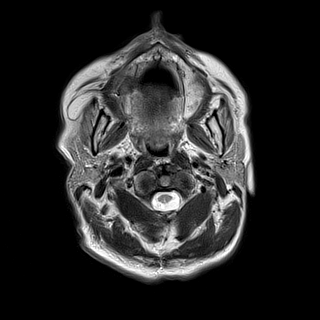
[im 28/28]
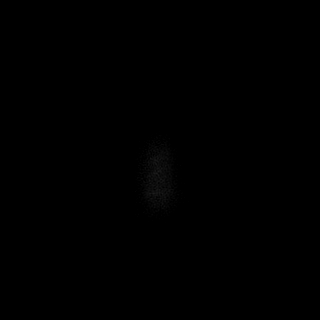

[Series 11: FLAIR · axial · 5.0mm · 0.47mm/px · z∈[-92,+66]mm · 2 of 28 slices shown]
[im 1/28]
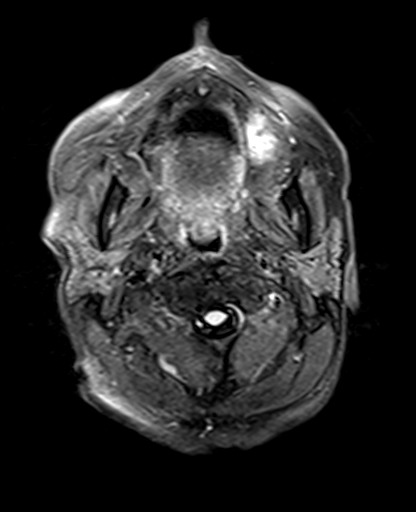
[im 28/28]
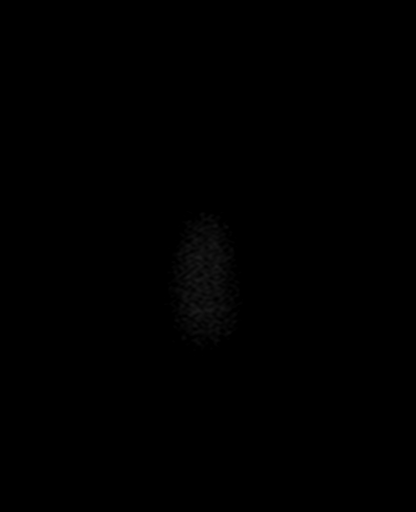

[Series 12: mag_images · axial · 3.0mm · 0.94mm/px · z∈[-93,+67]mm · 4 of 56 slices shown]
[im 1/56]
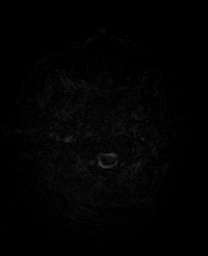
[im 19/56]
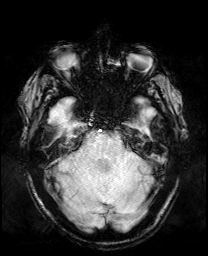
[im 37/56]
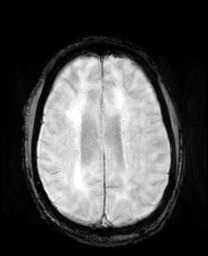
[im 56/56]
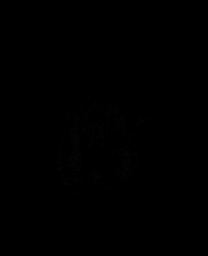

[Series 13: pha_images · axial · 3.0mm · 0.94mm/px · z∈[-90,+61]mm · 4 of 53 slices shown]
[im 1/53]
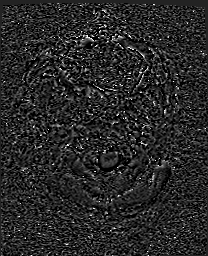
[im 18/53]
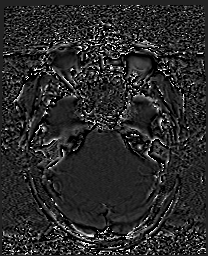
[im 35/53]
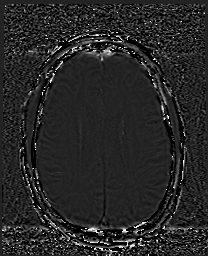
[im 53/53]
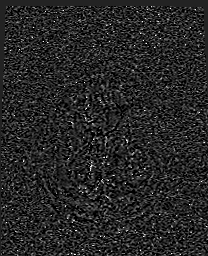

[Series 14: swi_images · axial · 3.0mm · 0.94mm/px · z∈[-93,+67]mm · 4 of 56 slices shown]
[im 1/56]
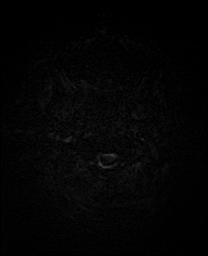
[im 19/56]
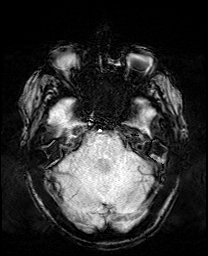
[im 37/56]
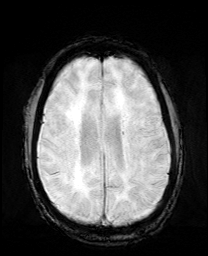
[im 56/56]
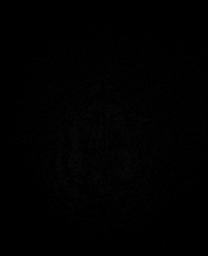

[Series 15: mip_images(sw) · axial · 24.0mm · 0.94mm/px · z∈[-83,+57]mm · 3 of 49 slices shown]
[im 1/49]
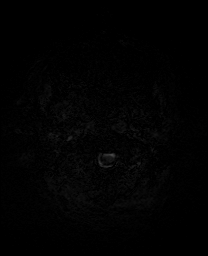
[im 25/49]
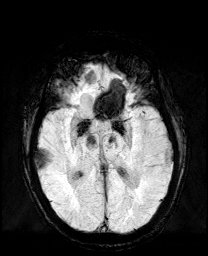
[im 49/49]
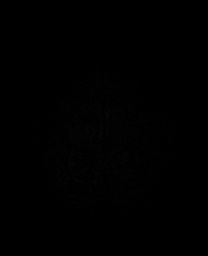

[Series 17: T2 · coronal · 5.0mm · 0.34mm/px · 3 of 37 slices shown (2 of 2)]
[im 1/37]
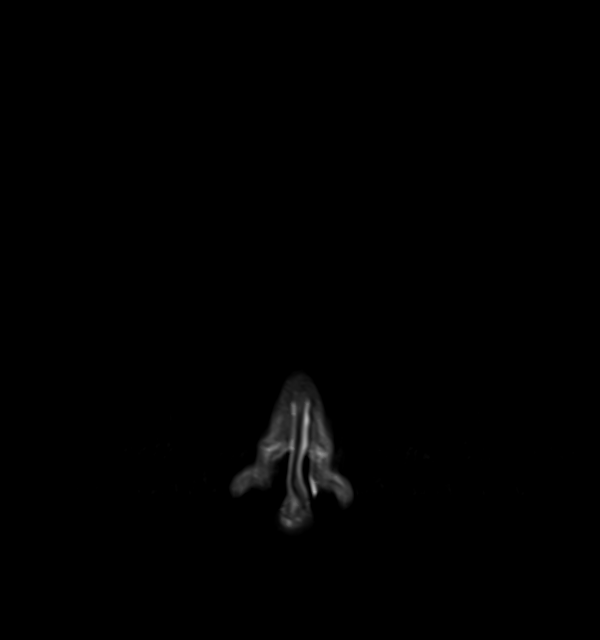
[im 19/37]
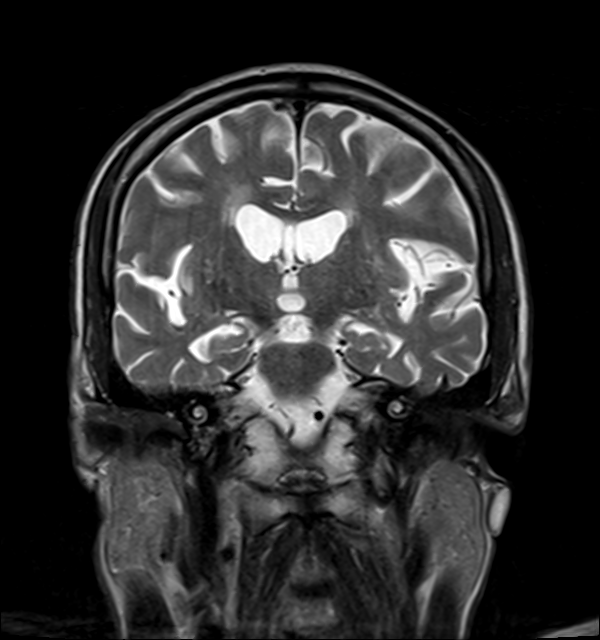
[im 37/37]
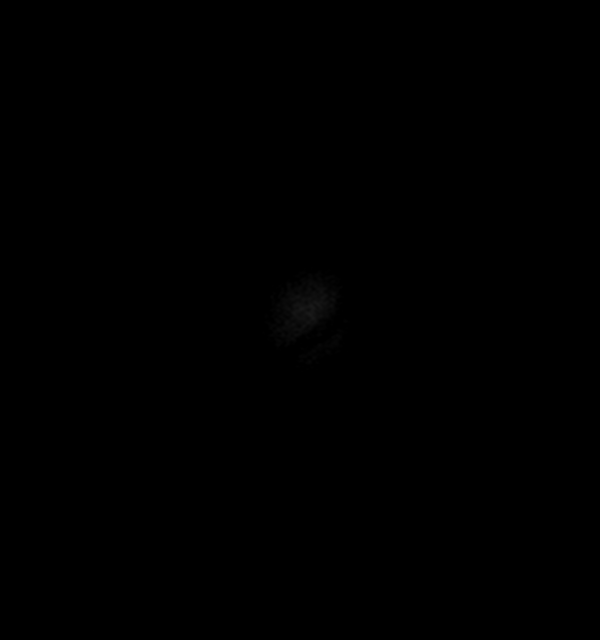

[44 of 48 positions shown; findings below may reference images not displayed]

FINDINGS: MRI HEAD FINDINGS

Brain: No acute infarct, mass effect or extra-axial collection. No
acute or chronic hemorrhage. Hyperintense T2-weighted signal is
moderately widespread throughout the white matter. Generalized
volume loss without a clear lobar predilection. Old bilateral
cerebellar infarcts. The midline structures are normal.

Vascular: Major flow voids are preserved.

Skull and upper cervical spine: Normal calvarium and skull base.
Visualized upper cervical spine and soft tissues are normal.

Sinuses/Orbits:No paranasal sinus fluid levels or advanced mucosal
thickening. No mastoid or middle ear effusion. Normal orbits.

MRA HEAD FINDINGS

POSTERIOR CIRCULATION:

--Vertebral arteries: Normal

--Inferior cerebellar arteries: Normal.

--Basilar artery: Normal.

--Superior cerebellar arteries: Normal.

--Posterior cerebral arteries: Normal.

ANTERIOR CIRCULATION:

--Intracranial internal carotid arteries: Normal.

--Anterior cerebral arteries (ACA): Normal.

--Middle cerebral arteries (MCA): Normal.

ANATOMIC VARIANTS: None

MRA NECK FINDINGS

Left dominant vertebral arteries. Both are patent without stenosis.
No carotid stenosis. Mild motion degradation.
IMPRESSION: 1. No acute intracranial abnormality.
2. Moderate chronic small vessel disease and volume loss.
3. Normal MRA of the head and neck.

## 2021-01-12 IMAGING — MR MR MRA HEAD W/O CM
1 series · 19 of 48 positions shown · non-contrast
Comparison: None.

CLINICAL DATA: Transient ischemic attack



[Series 5: 3d cow · axial · 0.5mm · 0.41mm/px · z∈[-76,+11]mm · 19 of 188 slices shown]
[im 1/188]
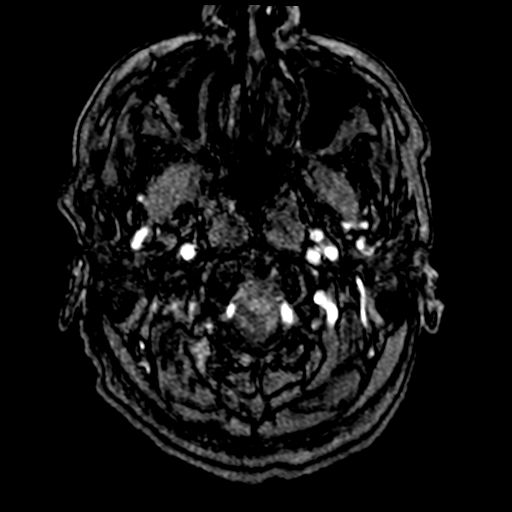
[im 4/188]
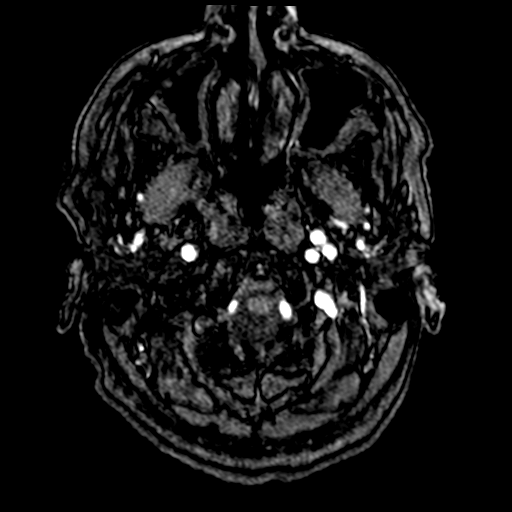
[im 8/188]
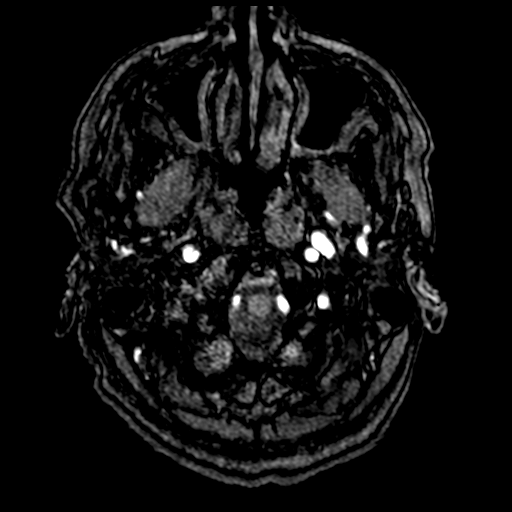
[im 12/188]
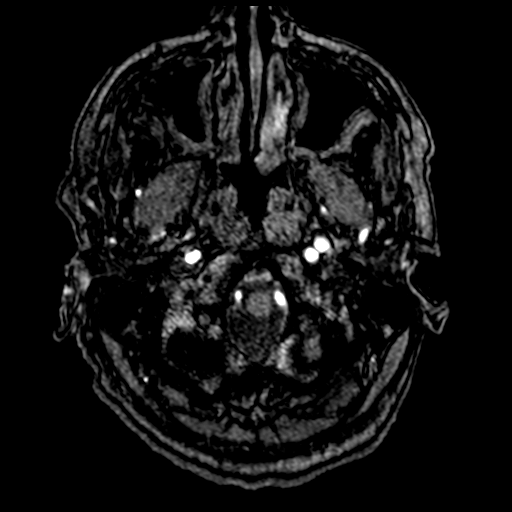
[im 16/188]
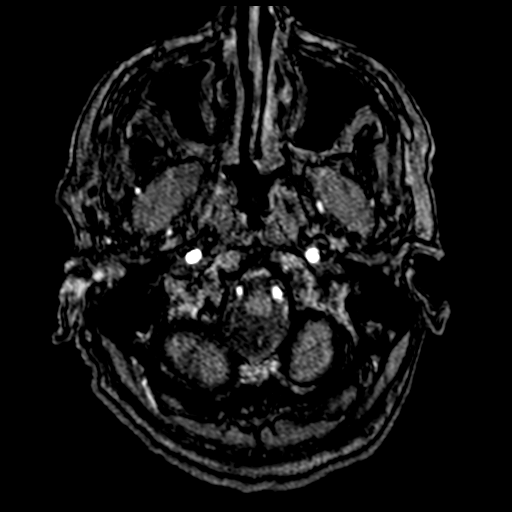
[im 20/188]
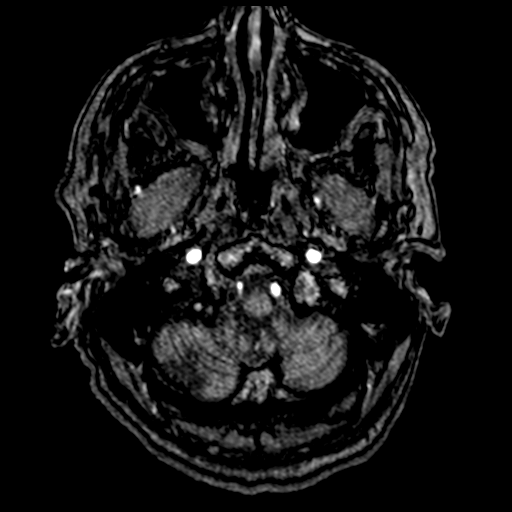
[im 24/188]
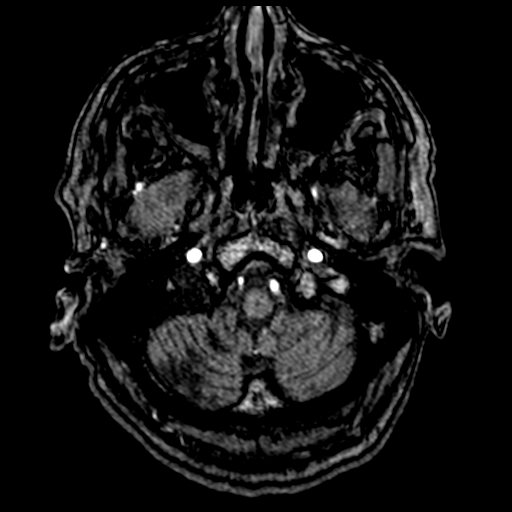
[im 28/188]
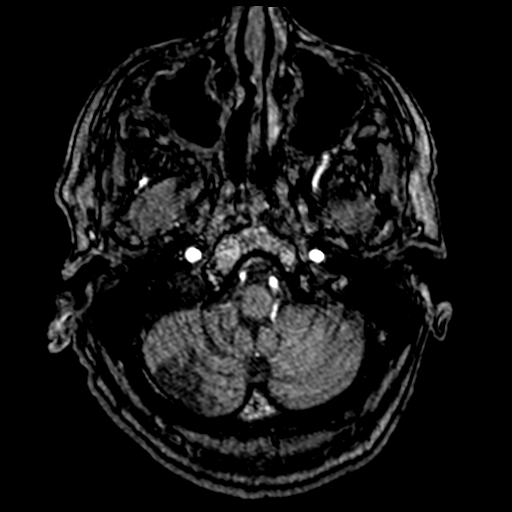
[im 32/188]
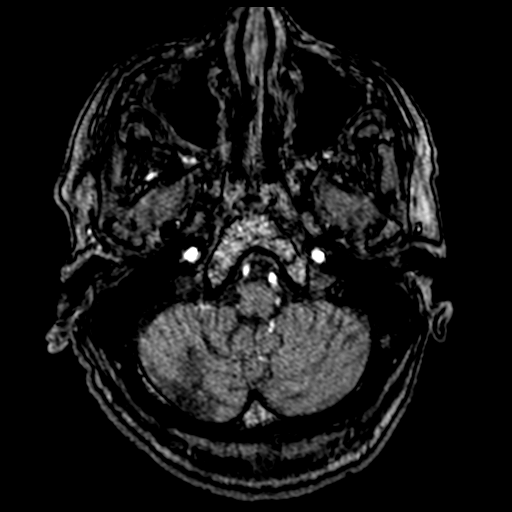
[im 36/188]
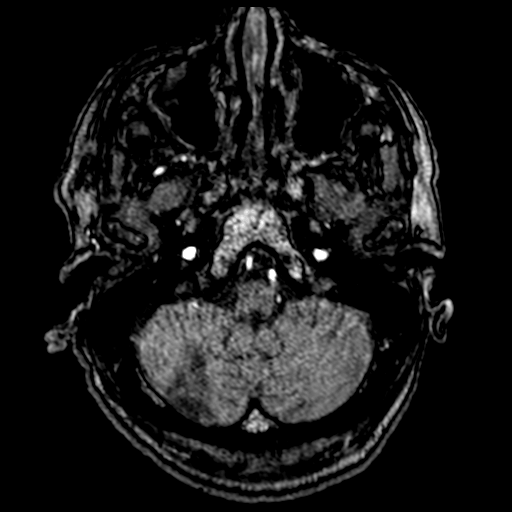
[im 40/188]
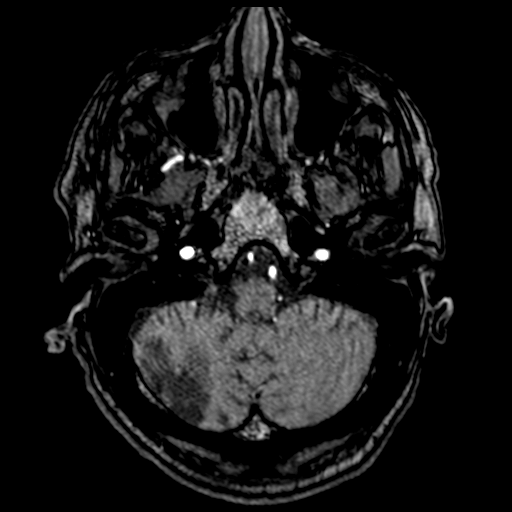
[im 60/188]
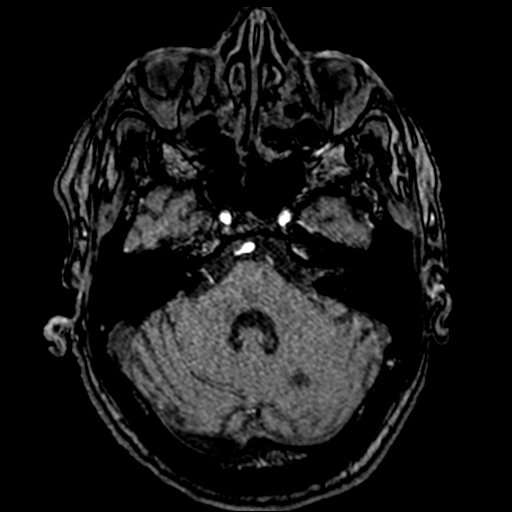
[im 84/188]
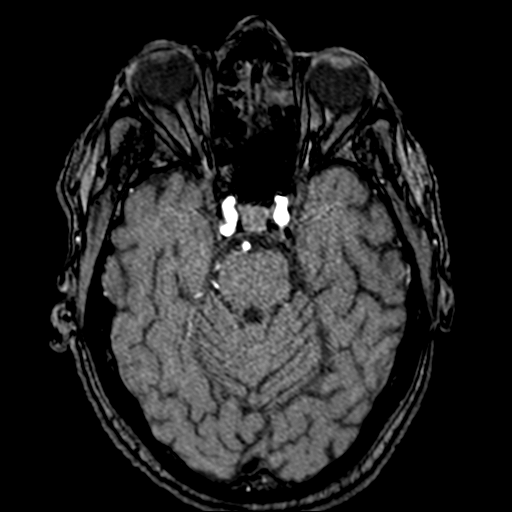
[im 96/188]
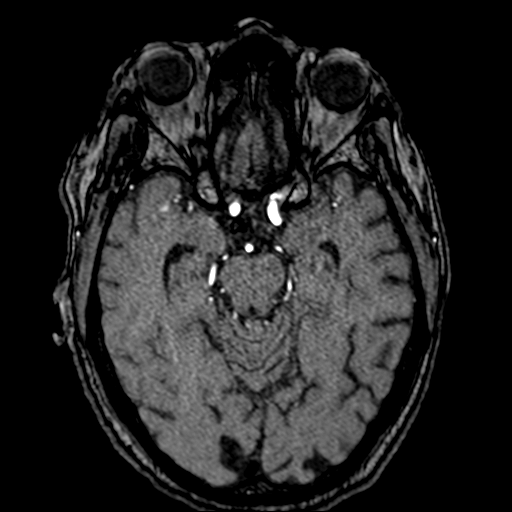
[im 108/188]
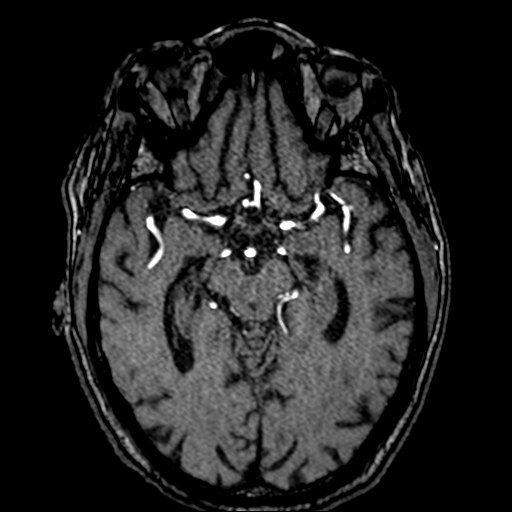
[im 132/188]
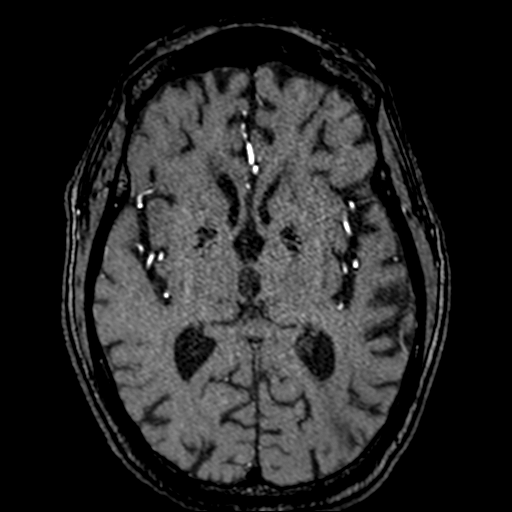
[im 156/188]
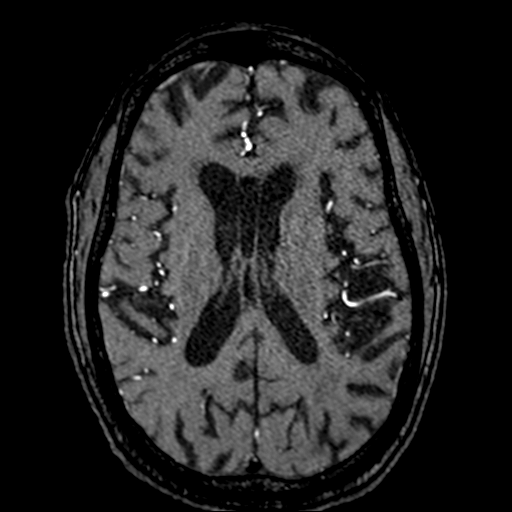
[im 160/188]
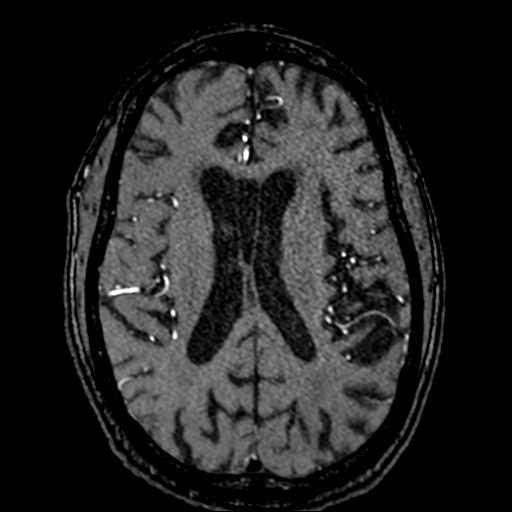
[im 180/188]
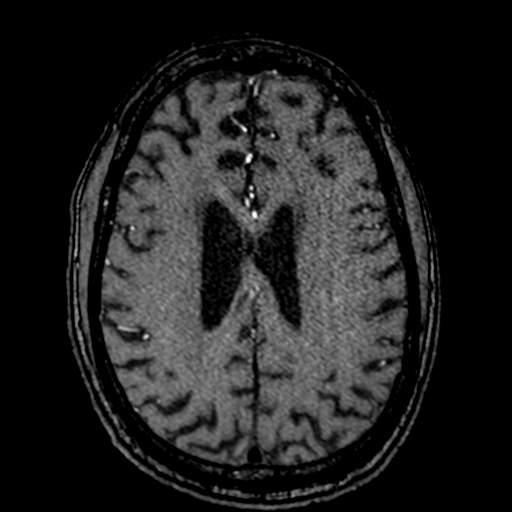

[19 of 48 positions shown; findings below may reference images not displayed]

FINDINGS: MRI HEAD FINDINGS

Brain: No acute infarct, mass effect or extra-axial collection. No
acute or chronic hemorrhage. Hyperintense T2-weighted signal is
moderately widespread throughout the white matter. Generalized
volume loss without a clear lobar predilection. Old bilateral
cerebellar infarcts. The midline structures are normal.

Vascular: Major flow voids are preserved.

Skull and upper cervical spine: Normal calvarium and skull base.
Visualized upper cervical spine and soft tissues are normal.

Sinuses/Orbits:No paranasal sinus fluid levels or advanced mucosal
thickening. No mastoid or middle ear effusion. Normal orbits.

MRA HEAD FINDINGS

POSTERIOR CIRCULATION:

--Vertebral arteries: Normal

--Inferior cerebellar arteries: Normal.

--Basilar artery: Normal.

--Superior cerebellar arteries: Normal.

--Posterior cerebral arteries: Normal.

ANTERIOR CIRCULATION:

--Intracranial internal carotid arteries: Normal.

--Anterior cerebral arteries (ACA): Normal.

--Middle cerebral arteries (MCA): Normal.

ANATOMIC VARIANTS: None

MRA NECK FINDINGS

Left dominant vertebral arteries. Both are patent without stenosis.
No carotid stenosis. Mild motion degradation.
IMPRESSION: 1. No acute intracranial abnormality.
2. Moderate chronic small vessel disease and volume loss.
3. Normal MRA of the head and neck.

## 2021-01-12 IMAGING — DX DG CHEST 1V PORT
1 series · 1 of 1 positions shown · non-contrast
Comparison: [DATE]

CLINICAL DATA: Altered mental status.

EXAM:
PORTABLE CHEST 1 VIEW

[chest ap]
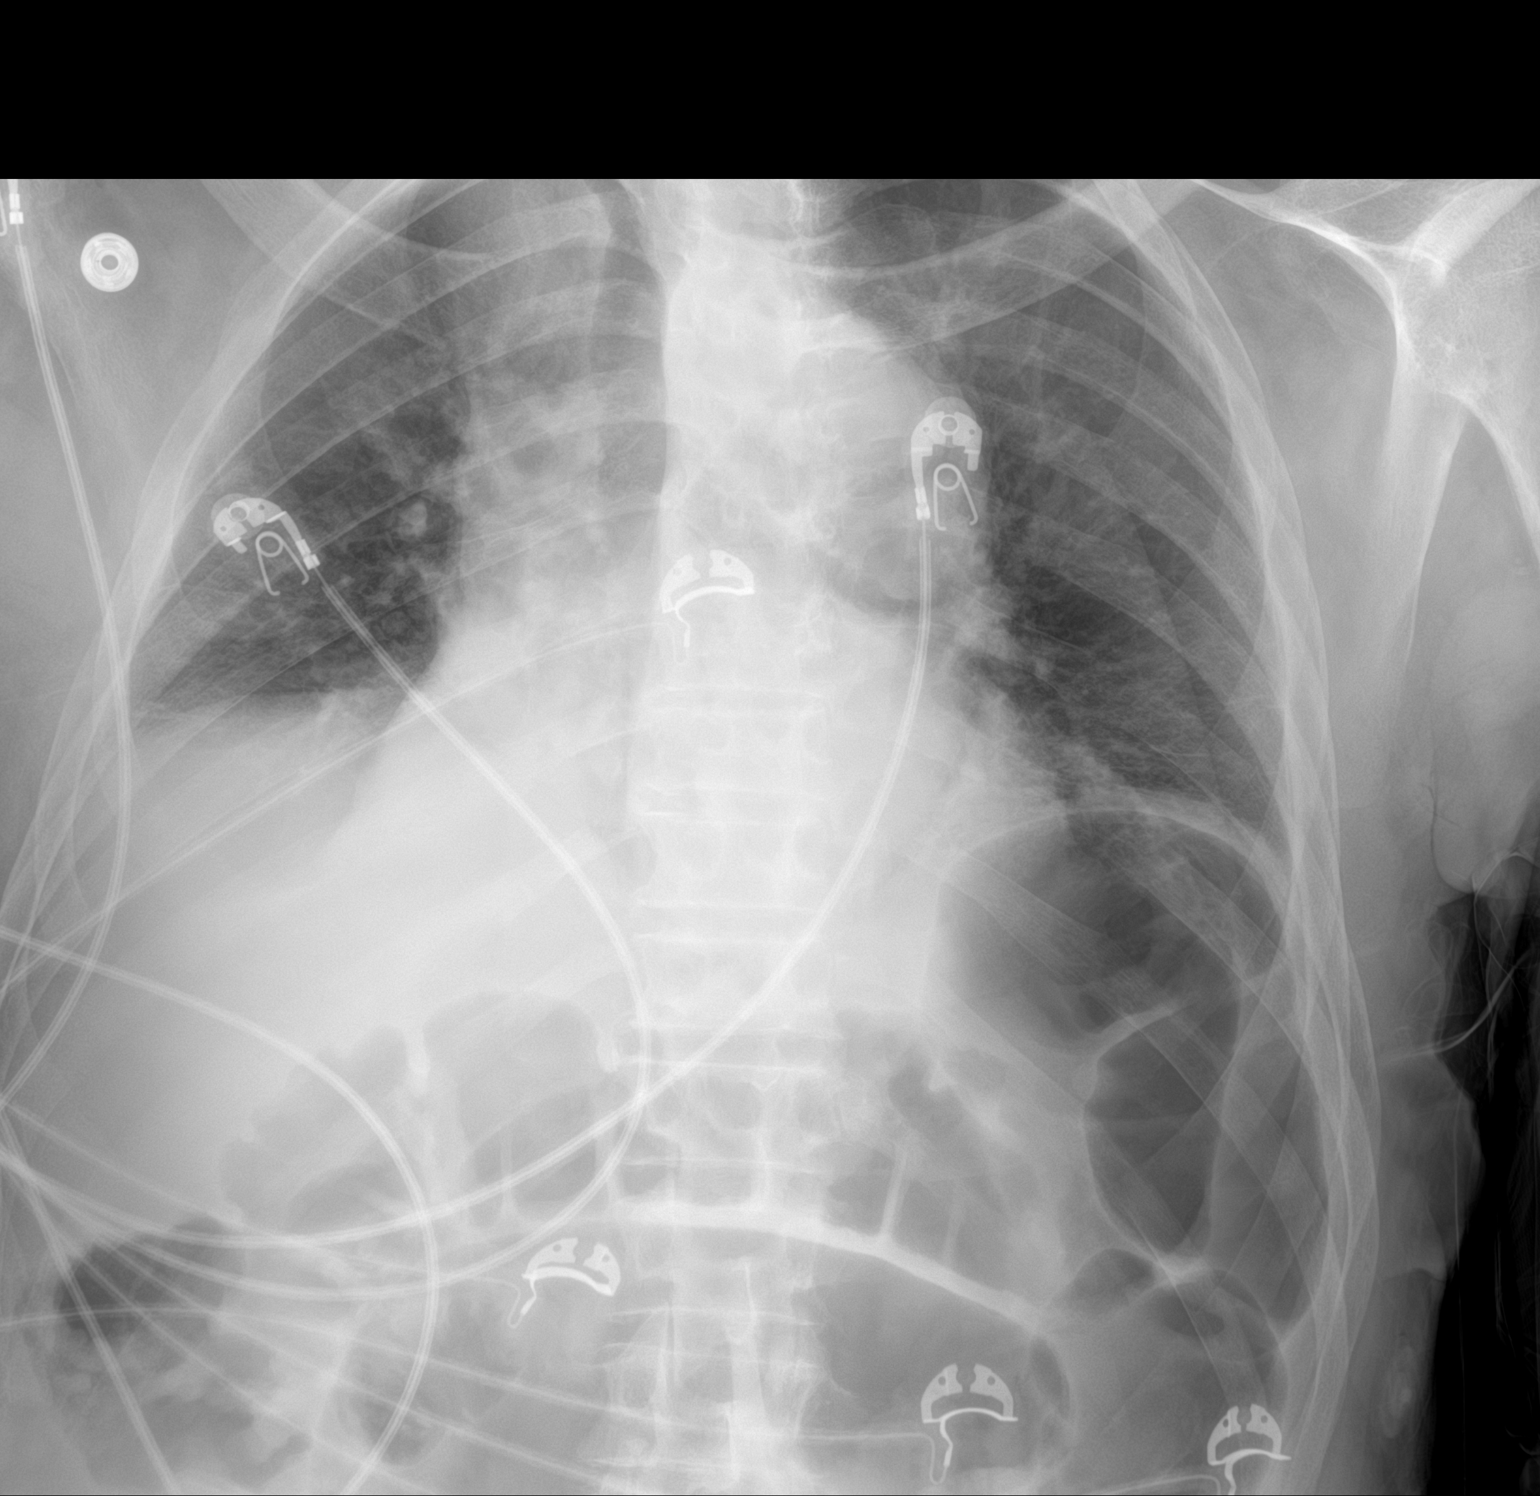

[1 of 1 positions shown; findings below may reference images not displayed]

FINDINGS: The study is limited secondary to patient rotation. Low lung volumes
are seen with mild areas of atelectasis noted within the bilateral
lung bases. There is no evidence of a pleural effusion or
pneumothorax. The heart size and mediastinal contours are within
normal limits. The visualized skeletal structures are unremarkable.
IMPRESSION: Low lung volumes with mild bibasilar atelectasis.

## 2021-01-12 IMAGING — CT CT HEAD CODE STROKE
3 series · 15 of 47 positions shown, 18 images · non-contrast
Comparison: None.

CLINICAL DATA: Code stroke.  Acute neurologic deficit

EXAM:
CT HEAD WITHOUT CONTRAST
TECHNIQUE: Contiguous axial images were obtained from the base of the skull
through the vertex without intravenous contrast.

[Series 3: head 5.0 h30s · axial · 0.44mm/px · z∈[-136,+4]mm · 9 of 34 slices shown, 12 images]
[im 3/34  brain]
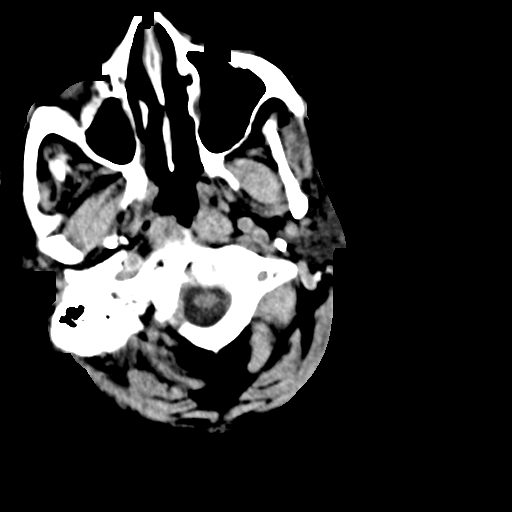
[im 3/34  bone]
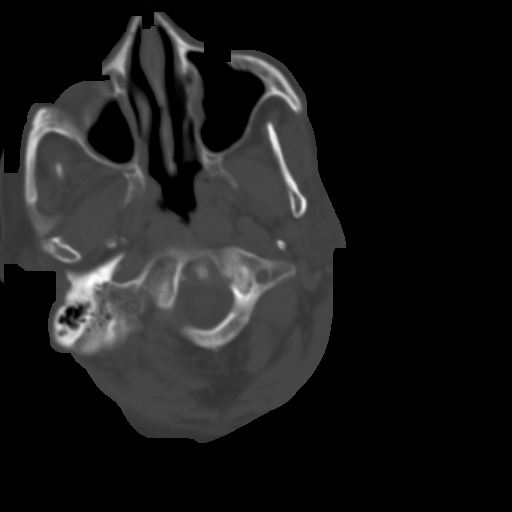
[im 6/34  brain]
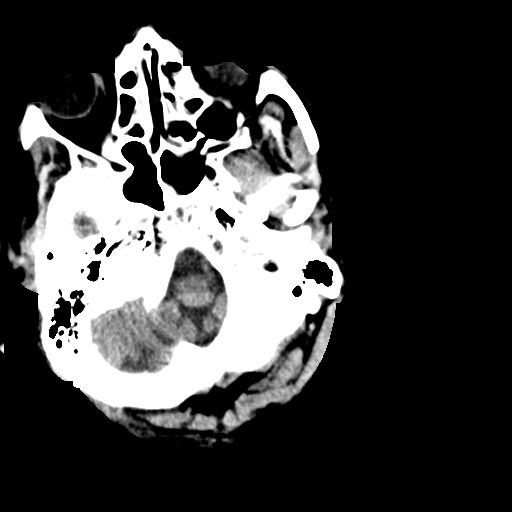
[im 10/34  brain]
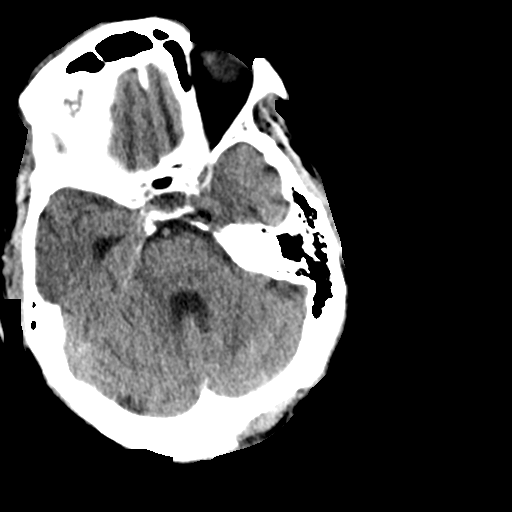
[im 13/34  brain]
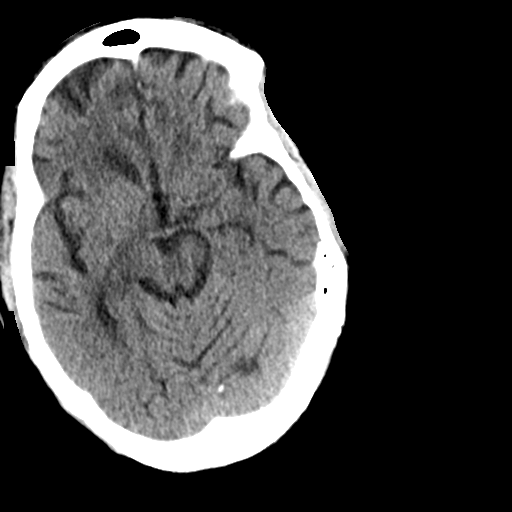
[im 18/34  brain]
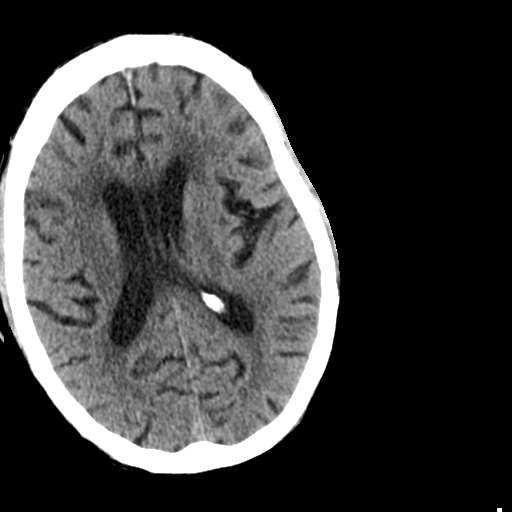
[im 18/34  bone]
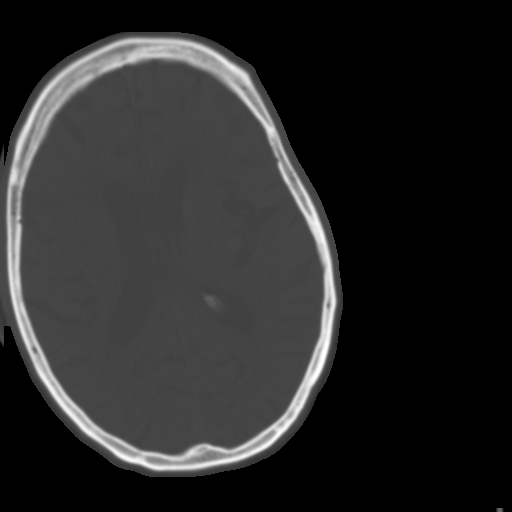
[im 21/34  brain]
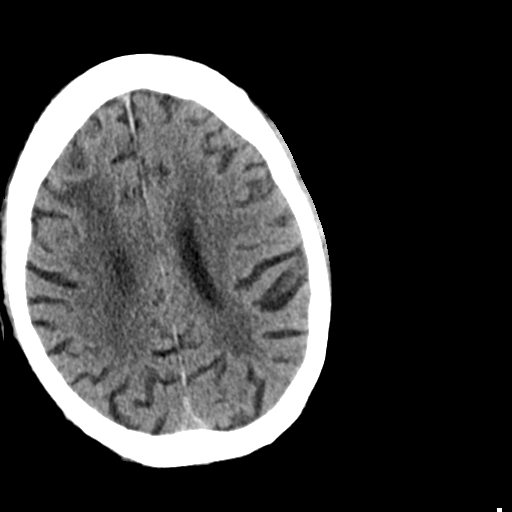
[im 24/34  brain]
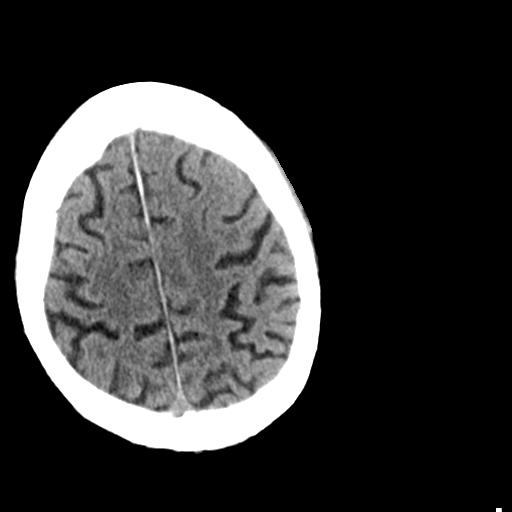
[im 28/34  brain]
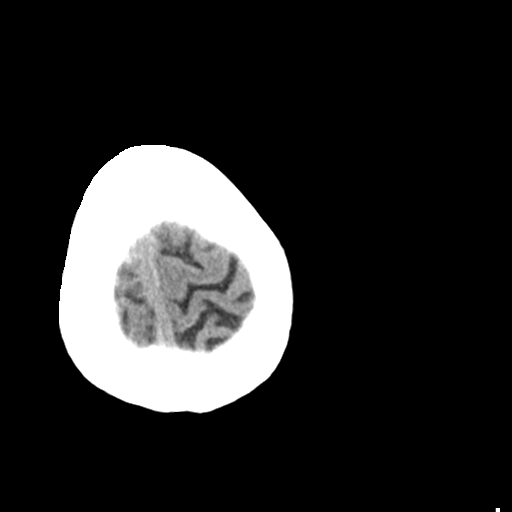
[im 31/34  brain]
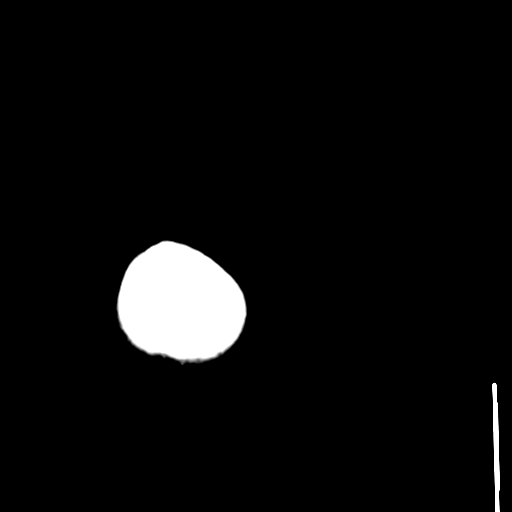
[im 31/34  bone]
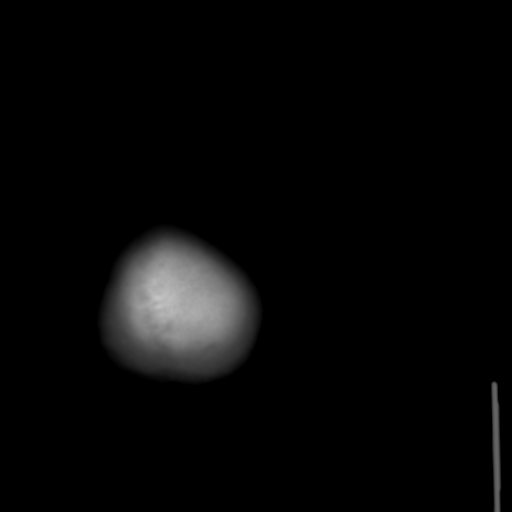

[Series 5: head 3.0 mpr cor · coronal · 0.33mm/px · 3 of 75 slices shown]
[im 25/75  brain]
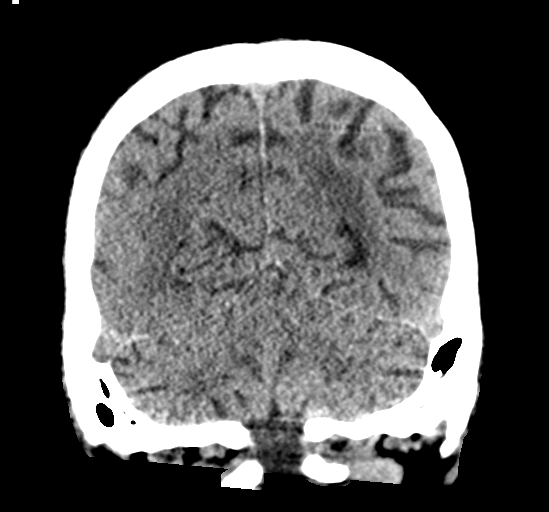
[im 33/75  brain]
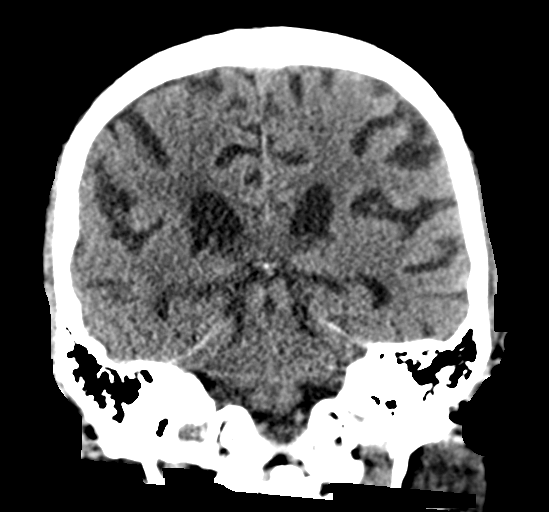
[im 42/75  brain]
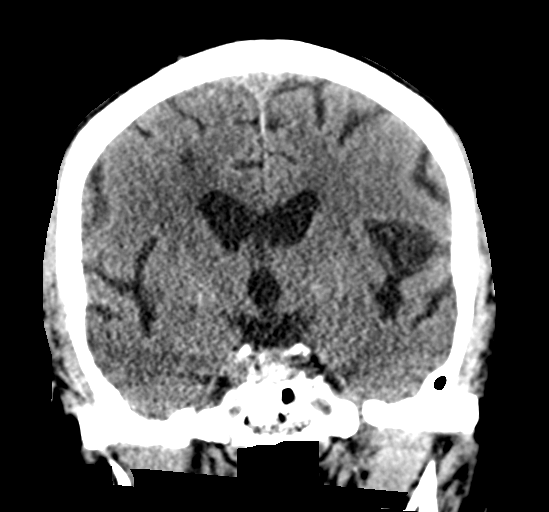

[Series 6: head 3.0 mpr sag · sagittal · 0.33mm/px · 3 of 67 slices shown]
[im 24/67  brain]
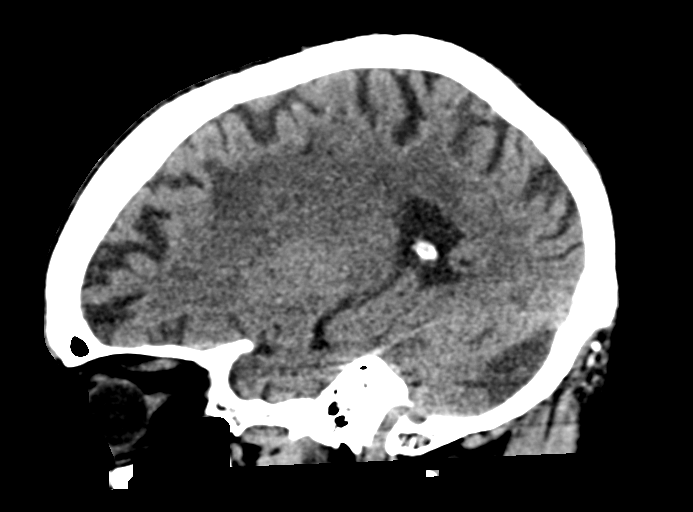
[im 34/67  brain]
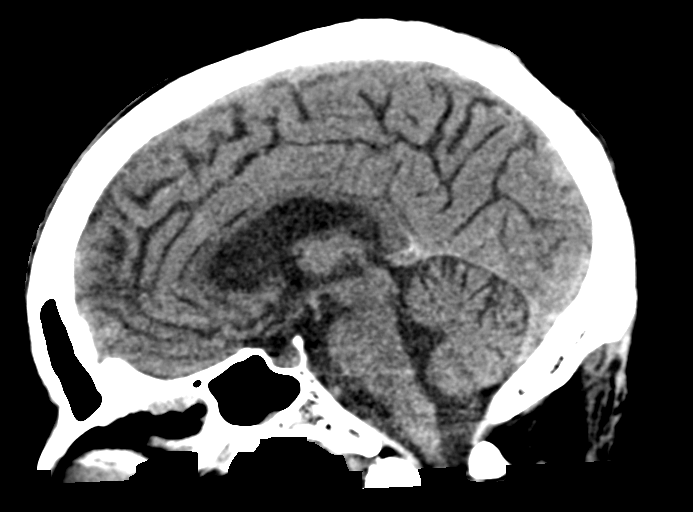
[im 43/67  brain]
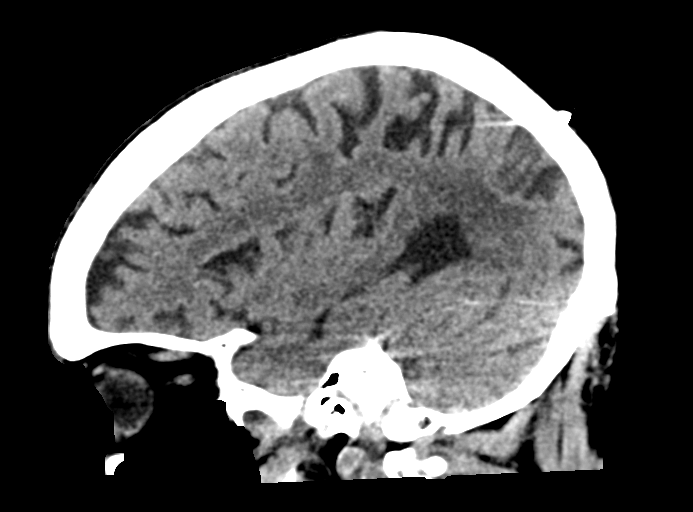

[15 of 47 positions shown; findings below may reference images not displayed]

FINDINGS: Brain: There is no mass, hemorrhage or extra-axial collection. There
is generalized atrophy without lobar predilection. There is
hypoattenuation of the periventricular white matter, most commonly
indicating chronic ischemic microangiopathy. There are old bilateral
cerebellar infarcts.

Vascular: No abnormal hyperdensity of the major intracranial
arteries or dural venous sinuses. No intracranial atherosclerosis.

Skull: The visualized skull base, calvarium and extracranial soft
tissues are normal.

Sinuses/Orbits: No fluid levels or advanced mucosal thickening of
the visualized paranasal sinuses. No mastoid or middle ear effusion.
The orbits are normal.

ASPECTS (Alberta Stroke Program Early CT Score)

- Ganglionic level infarction (caudate, lentiform nuclei, internal
capsule, insula, M1-M3 cortex): 7

- Supraganglionic infarction (M4-M6 cortex): 3

Total score (0-10 with 10 being normal): 10
IMPRESSION: 1. No acute intracranial abnormality.
2. ASPECTS is 10.
3. Chronic ischemic microangiopathy and old bilateral cerebellar
infarcts.

These results were communicated to Dr. IRENE SILVA at [DATE] on
[DATE] by text page via the [employer] messaging system.

## 2021-01-12 MED ORDER — SODIUM CHLORIDE 0.9 % IV BOLUS
1000.0000 mL | Freq: Once | INTRAVENOUS | Status: AC
  Administered 2021-01-13: 01:00:00 1000 mL via INTRAVENOUS

## 2021-01-12 MED ORDER — LORAZEPAM 2 MG/ML IJ SOLN: 1.0000 mg | Freq: Once | INTRAMUSCULAR | Status: DC
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
## 2021-01-12 NOTE — ED Notes (Signed)
Pt currently in MRI
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
## 2021-01-12 NOTE — ED Notes (Signed)
Update given to pts daughter. 

## 2021-01-12 NOTE — ED Triage Notes (Signed)
Pt from home with [facility] for a code stroke. LSN 5pm today. Daughter reports pt was leaning to the right side and had a right sided facial droop. Pt is paralyzed from the waist down. Pt alert and oriented to baseline. Multiple wounds everywhere, right hand amputation, post op 2 months. Pt evaluated by EDP and stroke team upon arrival and taken to CT

## 2021-01-13 ENCOUNTER — Encounter (HOSPITAL_COMMUNITY): Payer: Self-pay | Admitting: Family Medicine

## 2021-01-13 ENCOUNTER — Observation Stay (HOSPITAL_COMMUNITY): Payer: Medicare Other

## 2021-01-13 DIAGNOSIS — R29810 Facial weakness: Secondary | ICD-10-CM | POA: Diagnosis present

## 2021-01-13 DIAGNOSIS — G459 Transient cerebral ischemic attack, unspecified: Secondary | ICD-10-CM | POA: Diagnosis present

## 2021-01-13 DIAGNOSIS — N179 Acute kidney failure, unspecified: Secondary | ICD-10-CM | POA: Diagnosis present

## 2021-01-13 DIAGNOSIS — G9341 Metabolic encephalopathy: Principal | ICD-10-CM

## 2021-01-13 DIAGNOSIS — E119 Type 2 diabetes mellitus without complications: Secondary | ICD-10-CM | POA: Diagnosis not present

## 2021-01-13 DIAGNOSIS — Z89211 Acquired absence of right upper limb below elbow: Secondary | ICD-10-CM | POA: Diagnosis not present

## 2021-01-13 DIAGNOSIS — T07XXXA Unspecified multiple injuries, initial encounter: Secondary | ICD-10-CM

## 2021-01-13 DIAGNOSIS — G8220 Paraplegia, unspecified: Secondary | ICD-10-CM | POA: Diagnosis not present

## 2021-01-13 DIAGNOSIS — Z23 Encounter for immunization: Secondary | ICD-10-CM | POA: Diagnosis not present

## 2021-01-13 DIAGNOSIS — B9689 Other specified bacterial agents as the cause of diseases classified elsewhere: Secondary | ICD-10-CM | POA: Diagnosis not present

## 2021-01-13 DIAGNOSIS — E872 Acidosis, unspecified: Secondary | ICD-10-CM | POA: Diagnosis present

## 2021-01-13 DIAGNOSIS — I10 Essential (primary) hypertension: Secondary | ICD-10-CM | POA: Diagnosis present

## 2021-01-13 DIAGNOSIS — Z8616 Personal history of COVID-19: Secondary | ICD-10-CM | POA: Diagnosis not present

## 2021-01-13 DIAGNOSIS — Z888 Allergy status to other drugs, medicaments and biological substances status: Secondary | ICD-10-CM | POA: Diagnosis not present

## 2021-01-13 DIAGNOSIS — R532 Functional quadriplegia: Secondary | ICD-10-CM | POA: Diagnosis present

## 2021-01-13 DIAGNOSIS — L03115 Cellulitis of right lower limb: Secondary | ICD-10-CM | POA: Diagnosis present

## 2021-01-13 DIAGNOSIS — E785 Hyperlipidemia, unspecified: Secondary | ICD-10-CM | POA: Diagnosis present

## 2021-01-13 DIAGNOSIS — Z833 Family history of diabetes mellitus: Secondary | ICD-10-CM | POA: Diagnosis not present

## 2021-01-13 DIAGNOSIS — Z7984 Long term (current) use of oral hypoglycemic drugs: Secondary | ICD-10-CM | POA: Diagnosis not present

## 2021-01-13 DIAGNOSIS — L03116 Cellulitis of left lower limb: Secondary | ICD-10-CM

## 2021-01-13 DIAGNOSIS — I959 Hypotension, unspecified: Secondary | ICD-10-CM | POA: Diagnosis present

## 2021-01-13 DIAGNOSIS — J9811 Atelectasis: Secondary | ICD-10-CM | POA: Diagnosis present

## 2021-01-13 DIAGNOSIS — Z87891 Personal history of nicotine dependence: Secondary | ICD-10-CM | POA: Diagnosis not present

## 2021-01-13 DIAGNOSIS — A4189 Other specified sepsis: Secondary | ICD-10-CM | POA: Diagnosis not present

## 2021-01-13 DIAGNOSIS — Z20822 Contact with and (suspected) exposure to covid-19: Secondary | ICD-10-CM | POA: Diagnosis present

## 2021-01-13 DIAGNOSIS — L89153 Pressure ulcer of sacral region, stage 3: Secondary | ICD-10-CM | POA: Diagnosis present

## 2021-01-13 DIAGNOSIS — I83009 Varicose veins of unspecified lower extremity with ulcer of unspecified site: Secondary | ICD-10-CM | POA: Diagnosis present

## 2021-01-13 DIAGNOSIS — E1151 Type 2 diabetes mellitus with diabetic peripheral angiopathy without gangrene: Secondary | ICD-10-CM | POA: Diagnosis present

## 2021-01-13 LAB — ECHOCARDIOGRAM COMPLETE
AR max vel: 2.61 cm2
AV Area VTI: 2.3 cm2
AV Area mean vel: 2.34 cm2
AV Mean grad: 2 mmHg
AV Peak grad: 3.1 mmHg
Ao pk vel: 0.88 m/s
Area-P 1/2: 3.83 cm2
S' Lateral: 2.75 cm
Weight: 3089.97 oz

## 2021-01-13 LAB — CBG MONITORING, ED: Glucose-Capillary: 90 mg/dL (ref 70–99)

## 2021-01-13 LAB — CBC
HCT: 29.8 % — ABNORMAL LOW (ref 39.0–52.0)
Hemoglobin: 9.2 g/dL — ABNORMAL LOW (ref 13.0–17.0)
MCH: 31 pg (ref 26.0–34.0)
MCHC: 30.9 g/dL (ref 30.0–36.0)
MCV: 100.3 fL — ABNORMAL HIGH (ref 80.0–100.0)
Platelets: 193 10*3/uL (ref 150–400)
RBC: 2.97 MIL/uL — ABNORMAL LOW (ref 4.22–5.81)
RDW: 13.8 % (ref 11.5–15.5)
WBC: 8.4 10*3/uL (ref 4.0–10.5)
nRBC: 0 % (ref 0.0–0.2)

## 2021-01-13 LAB — URINALYSIS, ROUTINE W REFLEX MICROSCOPIC
Bacteria, UA: NONE SEEN
Bilirubin Urine: NEGATIVE
Glucose, UA: NEGATIVE mg/dL
Ketones, ur: NEGATIVE mg/dL
Leukocytes,Ua: NEGATIVE
Nitrite: NEGATIVE
Protein, ur: NEGATIVE mg/dL
Specific Gravity, Urine: 1.013 (ref 1.005–1.030)
pH: 5 (ref 5.0–8.0)

## 2021-01-13 LAB — LIPID PANEL
Cholesterol: 119 mg/dL (ref 0–200)
HDL: 34 mg/dL — ABNORMAL LOW (ref >40)
LDL Cholesterol: 69 mg/dL (ref 0–99)
Total CHOL/HDL Ratio: 3.5 RATIO
Triglycerides: 79 mg/dL (ref <150)
VLDL: 16 mg/dL (ref 0–40)

## 2021-01-13 LAB — MRSA NEXT GEN BY PCR, NASAL: MRSA by PCR Next Gen: NOT DETECTED

## 2021-01-13 LAB — BASIC METABOLIC PANEL
Anion gap: 7 (ref 5–15)
BUN: 35 mg/dL — ABNORMAL HIGH (ref 8–23)
CO2: 17 mmol/L — ABNORMAL LOW (ref 22–32)
Calcium: 8.3 mg/dL — ABNORMAL LOW (ref 8.9–10.3)
Chloride: 111 mmol/L (ref 98–111)
Creatinine, Ser: 1.13 mg/dL (ref 0.61–1.24)
GFR, Estimated: >60 mL/min (ref >60)
Glucose, Bld: 102 mg/dL — ABNORMAL HIGH (ref 70–99)
Potassium: 4.4 mmol/L (ref 3.5–5.1)
Sodium: 135 mmol/L (ref 135–145)

## 2021-01-13 LAB — GLUCOSE, CAPILLARY
Glucose-Capillary: 115 mg/dL — ABNORMAL HIGH (ref 70–99)
Glucose-Capillary: 162 mg/dL — ABNORMAL HIGH (ref 70–99)

## 2021-01-13 LAB — RAPID URINE DRUG SCREEN, HOSP PERFORMED
Amphetamines: NOT DETECTED
Barbiturates: NOT DETECTED
Benzodiazepines: NOT DETECTED
Cocaine: NOT DETECTED
Opiates: NOT DETECTED
Tetrahydrocannabinol: NOT DETECTED

## 2021-01-13 LAB — HEMOGLOBIN A1C
Hgb A1c MFr Bld: 7.6 % — ABNORMAL HIGH (ref 4.8–5.6)
Mean Plasma Glucose: 171.42 mg/dL

## 2021-01-13 MED ORDER — ACETAMINOPHEN 650 MG RE SUPP: 650.0000 mg | RECTAL | Status: DC | PRN

## 2021-01-13 MED ORDER — METFORMIN HCL ER 500 MG PO TB24
500.0000 mg | ORAL_TABLET | Freq: Two times a day (BID) | ORAL | Status: DC
  Administered 2021-01-13 – 2021-01-14 (×4): 500 mg via ORAL
  Filled 2021-01-13 (×5): qty 1

## 2021-01-13 MED ORDER — SODIUM CHLORIDE 0.9 % IV SOLN: INTRAVENOUS | Status: DC

## 2021-01-13 MED ORDER — VANCOMYCIN HCL 1500 MG/300ML IV SOLN
1500.0000 mg | INTRAVENOUS | Status: DC
  Administered 2021-01-13 – 2021-01-15 (×3): 1500 mg via INTRAVENOUS
  Filled 2021-01-13 (×3): qty 300

## 2021-01-13 MED ORDER — SENNOSIDES-DOCUSATE SODIUM 8.6-50 MG PO TABS: 1.0000 | ORAL_TABLET | Freq: Every evening | ORAL | Status: DC | PRN

## 2021-01-13 MED ORDER — ACETAMINOPHEN 325 MG PO TABS
650.0000 mg | ORAL_TABLET | ORAL | Status: DC | PRN
  Administered 2021-01-13 – 2021-01-20 (×5): 650 mg via ORAL
  Filled 2021-01-13 (×5): qty 2

## 2021-01-13 MED ORDER — ENOXAPARIN SODIUM 40 MG/0.4ML IJ SOSY
40.0000 mg | PREFILLED_SYRINGE | INTRAMUSCULAR | Status: DC
  Administered 2021-01-13 – 2021-01-24 (×12): 40 mg via SUBCUTANEOUS
  Filled 2021-01-13 (×12): qty 0.4

## 2021-01-13 MED ORDER — ACETAMINOPHEN 160 MG/5ML PO SOLN: 650.0000 mg | ORAL | Status: DC | PRN

## 2021-01-13 MED ORDER — PRAVASTATIN SODIUM 10 MG PO TABS
20.0000 mg | ORAL_TABLET | Freq: Every day | ORAL | Status: DC
  Administered 2021-01-13 – 2021-01-24 (×12): 20 mg via ORAL
  Filled 2021-01-13 (×12): qty 2

## 2021-01-13 MED ORDER — ASPIRIN EC 81 MG PO TBEC
81.0000 mg | DELAYED_RELEASE_TABLET | Freq: Every day | ORAL | Status: DC
  Administered 2021-01-13 – 2021-01-24 (×12): 81 mg via ORAL
  Filled 2021-01-13 (×13): qty 1

## 2021-01-13 MED ORDER — INSULIN ASPART 100 UNIT/ML IJ SOLN
0.0000 [IU] | Freq: Three times a day (TID) | INTRAMUSCULAR | Status: DC
Start: 2021-01-13 — End: 2021-01-24
  Administered 2021-01-13: 23:00:00 2 [IU] via SUBCUTANEOUS
  Administered 2021-01-14 (×2): 1 [IU] via SUBCUTANEOUS
  Administered 2021-01-16: 3 [IU] via SUBCUTANEOUS
  Administered 2021-01-16: 1 [IU] via SUBCUTANEOUS
  Administered 2021-01-16: 2 [IU] via SUBCUTANEOUS
  Administered 2021-01-17 (×2): 1 [IU] via SUBCUTANEOUS
  Administered 2021-01-17 – 2021-01-18 (×2): 2 [IU] via SUBCUTANEOUS
  Administered 2021-01-18: 1 [IU] via SUBCUTANEOUS
  Administered 2021-01-18: 2 [IU] via SUBCUTANEOUS
  Administered 2021-01-19: 3 [IU] via SUBCUTANEOUS
  Administered 2021-01-19: 2 [IU] via SUBCUTANEOUS
  Administered 2021-01-19: 3 [IU] via SUBCUTANEOUS
  Administered 2021-01-20: 2 [IU] via SUBCUTANEOUS
  Administered 2021-01-20: 3 [IU] via SUBCUTANEOUS
  Administered 2021-01-20 (×2): 2 [IU] via SUBCUTANEOUS
  Administered 2021-01-21 (×2): 3 [IU] via SUBCUTANEOUS
  Administered 2021-01-21 (×2): 2 [IU] via SUBCUTANEOUS
  Administered 2021-01-22: 1 [IU] via SUBCUTANEOUS
  Administered 2021-01-22: 2 [IU] via SUBCUTANEOUS
  Administered 2021-01-22: 1 [IU] via SUBCUTANEOUS
  Administered 2021-01-22: 2 [IU] via SUBCUTANEOUS
  Administered 2021-01-23: 3 [IU] via SUBCUTANEOUS
  Administered 2021-01-23: 1 [IU] via SUBCUTANEOUS
  Administered 2021-01-23: 2 [IU] via SUBCUTANEOUS
  Administered 2021-01-23: 3 [IU] via SUBCUTANEOUS
  Administered 2021-01-24: 2 [IU] via SUBCUTANEOUS

## 2021-01-13 MED ORDER — STROKE: EARLY STAGES OF RECOVERY BOOK
Freq: Once | Status: AC
  Filled 2021-01-13: qty 1
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
## 2021-01-13 NOTE — Progress Notes (Signed)
Pharmacy Antibiotic Note  [name] [name] is a 75 y.o. adult admitted on 01/12/2021 with  lower extremity wounds .  Pharmacy has been consulted for Vancomycin dosing. WBC 11. Renal function ok.   Plan: Vancomycin 1500 mg IV q24h >>>Estimated AUC: 470 Trend WBC, temp, renal function  F/U infectious work-up Drug levels as indicated   Weight: 87.6 kg (193 lb 2 oz)  Temp (24hrs), Avg:98.2 F (36.8 C), Min:98.2 F (36.8 C), Max:98.2 F (36.8 C)  Recent Labs  Lab 01/12/21 1948 01/12/21 1953  WBC 11.0*  --   CREATININE 1.32* 1.10    Estimated Creatinine Clearance (by C-G formula based on SCr of 1.1 mg/dL) Male: 51.2 mL/min Male: 62.5 mL/min    Allergies  Allergen Reactions   Lisinopril Other (See Comments)    Hyperkalemia    Narda Bonds, PharmD, BCPS Clinical Pharmacist Phone: [phone]
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
## 2021-01-13 NOTE — Consult Note (Signed)
[place] Nurse Consult Note: Patient receiving care in [facility] ED007 Confused, keeps asking, "why am I here, I want to go home." He doesn't want to be touched.  Reason for Consult: Leg wounds, paraplegic Wound type: Chronic venous statis and wounds of unknown origin. Thickening and hyperpigmentation and hyperkeratosis of the BLE and hands/knuckles. 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 This is beyond the scope of practice for the [facility] nurse and must be followed by Dermatology.   Thank you for the consult. [place] nurse will not follow at this time.   Please re-consult the [place] team if needed.  Cathlean Marseilles Tamala Julian, MSN, RN, [place], Lysle Pearl, [facility] Wound Treatment Associate Pager [phone]

## 2021-01-13 NOTE — ED Provider Notes (Signed)
Discussed with Dr. Cheral Marker with neurology. He recommends admission to complete stroke work-up Will also need to have wounds addressed in his lower extremities Patient is awake and alert.  Denies any new complaints.  Chronic wounds are noted to his legs.  Distal pulses were found by Doppler Discussed with Dr. Tonie Griffith for admission   Ripley Fraise, MD 01/13/21 [phone]

## 2021-01-13 NOTE — Progress Notes (Signed)
PT Cancellation Note  Patient Details Name: [name] [name] MRN: [id number] DOB: 1945/04/07   Cancelled Treatment:    Reason Eval/Treat Not Completed: PT screened, no needs identified, will sign off. Patient has been bedbound x 15 years.  No PT needs.   Thank you,   Shanna Cisco 01/13/2021, 2:47 PM

## 2021-01-13 NOTE — ED Notes (Signed)
Augustin Schooling (Daughter) called and ask for an update. Her number is [phone].
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
## 2021-01-13 NOTE — Progress Notes (Signed)
Patient is admitted this am, details please see HPI He presents with acute onset of right facial droop he is admitted to CVA/TIA work up, neurology consulted, he is not oriented to time, he knows he is [facility]. He is a functional quadraplegia ,baseline bedridden, left upper extremity chronic spasm Some congested cough, on room air, no hypoxia, denies sob,cxr low lung volume with atelectasis, speech ordered  Chronic bilateral lower extremity wounds with leukocytosis he is started on abx, wound care consulted Reports from home, lives with daughter

## 2021-01-14 DIAGNOSIS — G459 Transient cerebral ischemic attack, unspecified: Secondary | ICD-10-CM | POA: Diagnosis not present

## 2021-01-14 LAB — CBC
HCT: 27.1 % — ABNORMAL LOW (ref 39.0–52.0)
Hemoglobin: 8.7 g/dL — ABNORMAL LOW (ref 13.0–17.0)
MCH: 31.4 pg (ref 26.0–34.0)
MCHC: 32.1 g/dL (ref 30.0–36.0)
MCV: 97.8 fL (ref 80.0–100.0)
Platelets: 201 10*3/uL (ref 150–400)
RBC: 2.77 MIL/uL — ABNORMAL LOW (ref 4.22–5.81)
RDW: 13.5 % (ref 11.5–15.5)
WBC: 8 10*3/uL (ref 4.0–10.5)
nRBC: 0 % (ref 0.0–0.2)

## 2021-01-14 LAB — BASIC METABOLIC PANEL
Anion gap: 9 (ref 5–15)
BUN: 37 mg/dL — ABNORMAL HIGH (ref 8–23)
CO2: 15 mmol/L — ABNORMAL LOW (ref 22–32)
Calcium: 8.2 mg/dL — ABNORMAL LOW (ref 8.9–10.3)
Chloride: 111 mmol/L (ref 98–111)
Creatinine, Ser: 1.18 mg/dL (ref 0.61–1.24)
GFR, Estimated: >60 mL/min (ref >60)
Glucose, Bld: 98 mg/dL (ref 70–99)
Potassium: 4.7 mmol/L (ref 3.5–5.1)
Sodium: 135 mmol/L (ref 135–145)

## 2021-01-14 LAB — GLUCOSE, CAPILLARY
Glucose-Capillary: 78 mg/dL (ref 70–99)
Glucose-Capillary: 133 mg/dL — ABNORMAL HIGH (ref 70–99)
Glucose-Capillary: 114 mg/dL — ABNORMAL HIGH (ref 70–99)
Glucose-Capillary: 136 mg/dL — ABNORMAL HIGH (ref 70–99)

## 2021-01-14 LAB — MAGNESIUM: Magnesium: 1.3 mg/dL — ABNORMAL LOW (ref 1.7–2.4)

## 2021-01-14 LAB — HEMOGLOBIN AND HEMATOCRIT, BLOOD
HCT: 26.4 % — ABNORMAL LOW (ref 39.0–52.0)
Hemoglobin: 8.6 g/dL — ABNORMAL LOW (ref 13.0–17.0)

## 2021-01-14 MED ORDER — MAGNESIUM SULFATE 2 GM/50ML IV SOLN
2.0000 g | Freq: Once | INTRAVENOUS | Status: AC
  Administered 2021-01-14: 2 g via INTRAVENOUS
  Filled 2021-01-14: qty 50

## 2021-01-14 MED ORDER — SODIUM CHLORIDE 0.9 % IV BOLUS
1000.0000 mL | Freq: Once | INTRAVENOUS | Status: AC
  Administered 2021-01-14: 1000 mL via INTRAVENOUS

## 2021-01-14 NOTE — Progress Notes (Signed)
PROGRESS NOTE    [name] [name]  VZC:[id number]  DOB: 01/28/1945  DOA: 01/12/2021 PCP: Beckie Salts, MD Outpatient Specialists:   Hospital course:  75 year old man with paraplegia/functional quadriplegia, DM, PVD S/PE right elbow amputation was admitted yesterday for bilateral lower extremity cellulitis and possible right facial droop.   Subjective:  Is confused, answers questions appropriately but it is clear that he is not sure where he is or why he is here.   Objective: Vitals:   01/14/21 0630 01/14/21 0800 01/14/21 1201 01/14/21 1656  BP: 99/61 98/67 (!) 83/61 96/64  Pulse: 91 92 95   Resp: 14 15 19 18   Temp:  98.4 F (36.9 C) 98.6 F (37 C) 98.5 F (36.9 C)  TempSrc:  Oral Oral Oral  SpO2: 93% 96% 94%   Weight:      Height:        Intake/Output Summary (Last 24 hours) at 01/14/2021 1733 Last data filed at 01/14/2021 1449 Gross per 24 hour  Intake 2064.65 ml  Output 300 ml  Net 1764.65 ml   Filed Weights   01/12/21 1953  Weight: 87.6 kg     Exam:  General: Chronically ill-appearing man sitting up in bed in good spirits Eyes: sclera anicteric, conjuctiva mild injection bilaterally CVS: S1-S2, regular  Respiratory:  decreased air entry bilaterally secondary to decreased inspiratory effort, rales at bases  GI: NABS, soft, NT  LE: Patient's legs are bilaterally edematous with multiple shallow ulcers, ecchymoses as well as clear erythema bilaterally suggestive of cellulitis.  Psych: Patient has fluent speech but clearly has cognitive and memory deficits.  Assessment & Plan:   Altered mental status secondary to metabolic encephalopathy secondary to cellulitis Not entirely clear what patient's baseline is There was some concern for TIA however CT is negative patient seen by neurology who feel this is most likely metabolic encephalopathy . Will try to contact family to see what his baseline is. Continue aspirin and statin per neurology  recommendations.  Bilateral lower extremity wounds and cellulitis Patient has multiple wounds/ulcers on his markedly edematous lower extremities It is unclear to me if patient has cellulitis vs chronic venous stasis disease Continue vancomycin for now however low threshold for discontinuing given afebrile state and no leukocytosis. Wound care consultation placed  Hypomagnesemia at 1.3 We will replete and recheck  Hypotension Patient has low normal blood pressures at baseline Is responded well to fluid resuscitation  DM2 Discontinue metformin Start low-dose SSI AC at bedtime   DVT prophylaxis: Lovenox Code Status: Full Family Communication: None Disposition Plan:   Anticipated Discharge Location: TBD  Barriers to Discharge: Cellulitis  Is patient medically stable for Discharge: No   Scheduled Meds:  aspirin EC  81 mg Oral Daily   enoxaparin (LOVENOX) injection  40 mg Subcutaneous Q24H   insulin aspart  0-9 Units Subcutaneous TID WC & HS   LORazepam  1 mg Intravenous Once   metFORMIN  500 mg Oral BID AC   pravastatin  20 mg Oral Daily   Continuous Infusions:  sodium chloride 75 mL/hr at 01/14/21 0153   vancomycin 1,500 mg (01/14/21 0506)    Data Reviewed:  Basic Metabolic Panel: Recent Labs  Lab 01/12/21 1948 01/12/21 1953 01/13/21 0850 01/14/21 0116  NA 132* 134* 135 135  K 4.9 4.9 4.4 4.7  CL 108 108 111 111  CO2 16*  --  17* 15*  GLUCOSE 176* 174* 102* 98  BUN 37* 35* 35* 37*  CREATININE 1.32* 1.10 1.13  1.18  CALCIUM 8.4*  --  8.3* 8.2*  MG  --   --   --  1.3*    CBC: Recent Labs  Lab 01/12/21 1948 01/12/21 1953 01/13/21 0850 01/14/21 0116 01/14/21 0834  WBC 11.0*  --  8.4 8.0  --   NEUTROABS 7.5  --   --   --   --   HGB 10.9* 11.6* 9.2* 8.7* 8.6*  HCT 34.4* 34.0* 29.8* 27.1* 26.4*  MCV 101.5*  --  100.3* 97.8  --   PLT 208  --  193 201  --     Studies: MR ANGIO HEAD WO CONTRAST  Result Date: 01/13/2021 CLINICAL DATA:  Transient  ischemic attack EXAM: MRI HEAD WITHOUT CONTRAST MRA HEAD WITHOUT CONTRAST MRA NECK WITHOUT CONTRAST TECHNIQUE: Multiplanar, multiecho pulse sequences of the brain and surrounding structures were obtained without intravenous contrast. Angiographic images of the Circle of Willis were obtained using MRA technique without intravenous contrast. Angiographic images of the neck were obtained using MRA technique without intravenous contrast. Carotid stenosis measurements (when applicable) are obtained utilizing NASCET criteria, using the distal internal carotid diameter as the denominator. COMPARISON:  None. FINDINGS: MRI HEAD FINDINGS Brain: No acute infarct, mass effect or extra-axial collection. No acute or chronic hemorrhage. Hyperintense T2-weighted signal is moderately widespread throughout the white matter. Generalized volume loss without a clear lobar predilection. Old bilateral cerebellar infarcts. The midline structures are normal. Vascular: Major flow voids are preserved. Skull and upper cervical spine: Normal calvarium and skull base. Visualized upper cervical spine and soft tissues are normal. Sinuses/Orbits:No paranasal sinus fluid levels or advanced mucosal thickening. No mastoid or middle ear effusion. Normal orbits. MRA HEAD FINDINGS POSTERIOR CIRCULATION: --Vertebral arteries: Normal --Inferior cerebellar arteries: Normal. --Basilar artery: Normal. --Superior cerebellar arteries: Normal. --Posterior cerebral arteries: Normal. ANTERIOR CIRCULATION: --Intracranial internal carotid arteries: Normal. --Anterior cerebral arteries (ACA): Normal. --Middle cerebral arteries (MCA): Normal. ANATOMIC VARIANTS: None MRA NECK FINDINGS Left dominant vertebral arteries. Both are patent without stenosis. No carotid stenosis. Mild motion degradation. IMPRESSION: 1. No acute intracranial abnormality. 2. 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 3. Normal MRA of the head and neck. Electronically Signed   By:  Ulyses Jarred M.D.   On: 01/13/2021 01:22   MR ANGIO NECK WO CONTRAST  Result Date: 01/13/2021 CLINICAL DATA:  Transient ischemic attack EXAM: MRI HEAD WITHOUT CONTRAST MRA HEAD WITHOUT CONTRAST MRA NECK WITHOUT CONTRAST TECHNIQUE: Multiplanar, multiecho pulse sequences of the brain and surrounding structures were obtained without intravenous contrast. Angiographic images of the Circle of Willis were obtained using MRA technique without intravenous contrast. Angiographic images of the neck were obtained using MRA technique without intravenous contrast. Carotid stenosis measurements (when applicable) are obtained utilizing NASCET criteria, using the distal internal carotid diameter as the denominator. COMPARISON:  None. FINDINGS: MRI HEAD FINDINGS Brain: No acute infarct, mass effect or extra-axial collection. No acute or chronic hemorrhage. Hyperintense T2-weighted signal is moderately widespread throughout the white matter. Generalized volume loss without a clear lobar predilection. Old bilateral cerebellar infarcts. The midline structures are normal. Vascular: Major flow voids are preserved. Skull and upper cervical spine: Normal calvarium and skull base. Visualized upper cervical spine and soft tissues are normal. Sinuses/Orbits:No paranasal sinus fluid levels or advanced mucosal thickening. No mastoid or middle ear effusion. Normal orbits. MRA HEAD FINDINGS POSTERIOR CIRCULATION: --Vertebral arteries: Normal --Inferior cerebellar arteries: Normal. --Basilar artery: Normal. --Superior cerebellar arteries: Normal. --Posterior cerebral arteries: Normal. ANTERIOR CIRCULATION: --Intracranial internal carotid arteries: Normal. --Anterior  cerebral arteries (ACA): Normal. --Middle cerebral arteries (MCA): Normal. ANATOMIC VARIANTS: None MRA NECK FINDINGS Left dominant vertebral arteries. Both are patent without stenosis. No carotid stenosis. Mild motion degradation. IMPRESSION: 1. No acute intracranial abnormality.  2. 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 3. Normal MRA of the head and neck. Electronically Signed   By: Ulyses Jarred M.D.   On: 01/13/2021 01:22   MR BRAIN WO CONTRAST  Result Date: 01/13/2021 CLINICAL DATA:  Transient ischemic attack EXAM: MRI HEAD WITHOUT CONTRAST MRA HEAD WITHOUT CONTRAST MRA NECK WITHOUT CONTRAST TECHNIQUE: Multiplanar, multiecho pulse sequences of the brain and surrounding structures were obtained without intravenous contrast. Angiographic images of the Circle of Willis were obtained using MRA technique without intravenous contrast. Angiographic images of the neck were obtained using MRA technique without intravenous contrast. Carotid stenosis measurements (when applicable) are obtained utilizing NASCET criteria, using the distal internal carotid diameter as the denominator. COMPARISON:  None. FINDINGS: MRI HEAD FINDINGS Brain: No acute infarct, mass effect or extra-axial collection. No acute or chronic hemorrhage. Hyperintense T2-weighted signal is moderately widespread throughout the white matter. Generalized volume loss without a clear lobar predilection. Old bilateral cerebellar infarcts. The midline structures are normal. Vascular: Major flow voids are preserved. Skull and upper cervical spine: Normal calvarium and skull base. Visualized upper cervical spine and soft tissues are normal. Sinuses/Orbits:No paranasal sinus fluid levels or advanced mucosal thickening. No mastoid or middle ear effusion. Normal orbits. MRA HEAD FINDINGS POSTERIOR CIRCULATION: --Vertebral arteries: Normal --Inferior cerebellar arteries: Normal. --Basilar artery: Normal. --Superior cerebellar arteries: Normal. --Posterior cerebral arteries: Normal. ANTERIOR CIRCULATION: --Intracranial internal carotid arteries: Normal. --Anterior cerebral arteries (ACA): Normal. --Middle cerebral arteries (MCA): Normal. ANATOMIC VARIANTS: None MRA NECK FINDINGS Left dominant vertebral arteries. Both are  patent without stenosis. No carotid stenosis. Mild motion degradation. IMPRESSION: 1. No acute intracranial abnormality. 2. 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 3. Normal MRA of the head and neck. Electronically Signed   By: Ulyses Jarred M.D.   On: 01/13/2021 01:22   DG Chest Port 1 View  Result Date: 01/12/2021 CLINICAL DATA:  Altered mental status. EXAM: PORTABLE CHEST 1 VIEW COMPARISON:  January 11, 2020 FINDINGS: The study is limited secondary to patient rotation. Low lung volumes are seen with mild areas of atelectasis noted within the bilateral lung bases. There is no evidence of a pleural effusion or pneumothorax. The heart size and mediastinal contours are within normal limits. The visualized skeletal structures are unremarkable. IMPRESSION: Low lung volumes with mild bibasilar atelectasis. Electronically Signed   By: Virgina Norfolk M.D.   On: 01/12/2021 21:16   ECHOCARDIOGRAM COMPLETE  Result Date: 01/13/2021    ECHOCARDIOGRAM REPORT   Patient Name:   [name] [name] Date of Exam: 01/13/2021 Medical Rec #:  [id number]       Height:       68.0 in Accession #:    [id number]      Weight:       193.1 lb Date of Birth:  1945-08-25       BSA:          2.014 m Patient Age:    39 years        BP:           115/73 mmHg Patient Gender: M               HR:           100 bpm. Exam Location:  Inpatient Procedure:  2D Echo, Cardiac Doppler and Color Doppler Indications:    TIA  History:        Patient has no prior history of Echocardiogram examinations.                 Risk Factors:Hypertension and Diabetes.  Sonographer:    Glo Herring Referring Phys: [id number] [place]  1. Left ventricular ejection fraction, by estimation, is 60 to 65%. The left ventricle has normal function. The left ventricle has no regional wall motion abnormalities. Left ventricular diastolic parameters are consistent with Grade I diastolic dysfunction (impaired relaxation).  2. Right  ventricular systolic function is normal. The right ventricular size is normal. There is mildly elevated pulmonary artery systolic pressure. The estimated right ventricular systolic pressure is 78.4 mmHg.  3. The mitral valve is grossly normal. No evidence of mitral valve regurgitation. No evidence of mitral stenosis.  4. The aortic valve is tricuspid. Aortic valve regurgitation is not visualized. No aortic stenosis is present.  5. The inferior vena cava is normal in size with greater than 50% respiratory variability, suggesting right atrial pressure of 3 mmHg. Conclusion(s)/Recommendation(s): No intracardiac source of embolism detected on this transthoracic study. Consider a transesophageal echocardiogram to exclude cardiac source of embolism if clinically indicated. FINDINGS  Left Ventricle: Left ventricular ejection fraction, by estimation, is 60 to 65%. The left ventricle has normal function. The left ventricle has no regional wall motion abnormalities. The left ventricular internal cavity size was normal in size. There is  no left ventricular hypertrophy. Left ventricular diastolic parameters are consistent with Grade I diastolic dysfunction (impaired relaxation). Right Ventricle: The right ventricular size is normal. No increase in right ventricular wall thickness. Right ventricular systolic function is normal. There is mildly elevated pulmonary artery systolic pressure. The tricuspid regurgitant velocity is 3.01  m/s, and with an assumed right atrial pressure of 3 mmHg, the estimated right ventricular systolic pressure is 69.6 mmHg. Left Atrium: Left atrial size was normal in size. Right Atrium: Right atrial size was normal in size. Pericardium: There is no evidence of pericardial effusion. Mitral Valve: The mitral valve is grossly normal. No evidence of mitral valve regurgitation. No evidence of mitral valve stenosis. Tricuspid Valve: The tricuspid valve is grossly normal. Tricuspid valve regurgitation is  trivial. No evidence of tricuspid stenosis. Aortic Valve: The aortic valve is tricuspid. Aortic valve regurgitation is not visualized. No aortic stenosis is present. Aortic valve mean gradient measures 2.0 mmHg. Aortic valve peak gradient measures 3.1 mmHg. Aortic valve area, by VTI measures 2.30 cm. Pulmonic Valve: The pulmonic valve was grossly normal. Pulmonic valve regurgitation is not visualized. No evidence of pulmonic stenosis. Aorta: The aortic root and ascending aorta are structurally normal, with no evidence of dilitation. Venous: The inferior vena cava is normal in size with greater than 50% respiratory variability, suggesting right atrial pressure of 3 mmHg. IAS/Shunts: The atrial septum is grossly normal.  LEFT VENTRICLE PLAX 2D LVIDd:         3.70 cm   Diastology LVIDs:         2.75 cm   LV e' medial:    5.98 cm/s LV PW:         1.00 cm   LV E/e' medial:  8.3 LV IVS:        1.00 cm   LV e' lateral:   8.27 cm/s LVOT diam:     2.10 cm   LV E/e' lateral: 6.0 LV SV:  43 LV SV Index:   21 LVOT Area:     3.46 cm  IVC IVC diam: 2.10 cm LEFT ATRIUM             Index LA diam:        2.80 cm 1.39 cm/m LA Vol (A2C):   31.5 ml 15.64 ml/m LA Vol (A4C):   23.3 ml 11.57 ml/m LA Biplane Vol: 27.2 ml 13.51 ml/m  AORTIC VALVE                    PULMONIC VALVE AV Area (Vmax):    2.61 cm     PV Vmax:       0.68 m/s AV Area (Vmean):   2.34 cm     PV Peak grad:  1.8 mmHg AV Area (VTI):     2.30 cm AV Vmax:           87.60 cm/s AV Vmean:          66.400 cm/s AV VTI:            0.185 m AV Peak Grad:      3.1 mmHg AV Mean Grad:      2.0 mmHg LVOT Vmax:         66.10 cm/s LVOT Vmean:        44.900 cm/s LVOT VTI:          0.123 m LVOT/AV VTI ratio: 0.66  AORTA Ao Root diam: 3.20 cm Ao Asc diam:  3.30 cm MITRAL VALVE               TRICUSPID VALVE MV Area (PHT): 3.83 cm    TR Peak grad:   36.2 mmHg MV Decel Time: 198 msec    TR Vmax:        301.00 cm/s MV E velocity: 49.60 cm/s MV A velocity: 75.00 cm/s  SHUNTS MV  E/A ratio:  0.66        Systemic VTI:  0.12 m                            Systemic Diam: 2.10 cm Eleonore Chiquito MD Electronically signed by Eleonore Chiquito MD Signature Date/Time: 01/13/2021/1:43:11 PM    Final    CT HEAD CODE STROKE WO CONTRAST  Result Date: 01/12/2021 CLINICAL DATA:  Code stroke.  Acute neurologic deficit EXAM: CT HEAD WITHOUT CONTRAST TECHNIQUE: Contiguous axial images were obtained from the base of the skull through the vertex without intravenous contrast. COMPARISON:  None. FINDINGS: Brain: There is no mass, hemorrhage or extra-axial collection. There is generalized atrophy without lobar predilection. There is hypoattenuation of the periventricular white matter, most commonly indicating chronic ischemic microangiopathy. 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 Vascular: No abnormal hyperdensity of the major intracranial arteries or dural venous sinuses. No intracranial atherosclerosis. Skull: The visualized skull base, calvarium and extracranial soft tissues are normal. Sinuses/Orbits: No fluid levels or advanced mucosal thickening of the visualized paranasal sinuses. No mastoid or middle ear effusion. The orbits are normal. ASPECTS [facility] Stroke Program Early CT Score) - Ganglionic level infarction (caudate, lentiform nuclei, internal capsule, insula, M1-M3 cortex): 7 - Supraganglionic infarction (M4-M6 cortex): 3 Total score (0-10 with 10 being normal): 10 IMPRESSION: 1. No acute intracranial abnormality. 2. ASPECTS is 10. 3. 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 These results were communicated to Dr. Kerney Elbe at 8:15 pm on 01/12/2021 by text page via the [facility] messaging  system. Electronically Signed   By: Ulyses Jarred M.D.   On: 01/12/2021 20:16    Principal Problem:   TIA (transient ischemic attack) Active Problems:   Essential hypertension   Diabetes mellitus without complication (HCC)   Wounds, multiple   Paraplegia ([place])   Bilateral  cellulitis of lower leg     Javonn Gauger Derek Jack, [facility]  If 7PM-7AM, please contact night-coverage [email or web address]   LOS: 1 day

## 2021-01-14 NOTE — Progress Notes (Signed)
[facility] night cross cover note:  I was notified by RN of patient's soft blood pressures with most recent blood pressure 88/56.  Currently on normal saline at 75 cc/h.  Per my chart review, systolic blood pressures since midnight have been in the 80s to 90s mmHg, relative to systolic blood pressures in the 90s to low 100s mmHg since approximately 1300 on 01/13/2021.  Patient appears to have been admitted for TIA/CVA evaluation in setting of acute right facial droop, with baseline functional quadriplegia, baseline bedridden, and chronic spasm of the left upper extremity.  He also has chronic bilateral lower extremity wounds, was started on IV vancomycin for such, along with wound care consultation.   I subsequently ordered a 1 L normal saline bolus, following which blood pressure has improved to 99/61.  Additionally, hemoglobin this morning noted to be 8.7, compared to 9.2 yesterday a.m.  In the context of aforementioned mild hypotension, I ordered a repeat H&H to be checked at 8 AM this morning. Consider validation of hypotensive readings via manual blood pressures on bilateral upper extremities given history of chronic spasm involving the left upper extremity.     Babs Bertin, DO Hospitalist

## 2021-01-14 NOTE — Progress Notes (Signed)
Mr. Aumiller arrived to 17w24 at 66. Alert and oriented x2-3. Connected to telemetry. Performed skin assessment with Hewitt Blade RN. 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 Oriented to room, and soft touch call bell.
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
## 2021-01-15 DIAGNOSIS — G459 Transient cerebral ischemic attack, unspecified: Secondary | ICD-10-CM | POA: Diagnosis not present

## 2021-01-15 LAB — GLUCOSE, CAPILLARY
Glucose-Capillary: 98 mg/dL (ref 70–99)
Glucose-Capillary: 159 mg/dL — ABNORMAL HIGH (ref 70–99)

## 2021-01-15 NOTE — Progress Notes (Addendum)
PROGRESS NOTE    [name] [name]  TFT:[id number]  DOB: [birth year]  DOA: 01/12/2021 PCP: Beckie Salts, MD Outpatient Specialists:   Hospital course:  76 year old man with paraplegia/functional quadriplegia, DM, PVD S/PE right elbow amputation was admitted yesterday for bilateral lower extremity cellulitis and possible right facial droop.   Subjective:  Patient himself states that he is fine.  He is wondering why he is still here.  States he wants to go home to his daughter.  He denies that he refused blood draw earlier today but notes that he does not want to be in the hospital.   Objective: Vitals:   01/15/21 0016 01/15/21 0354 01/15/21 0746 01/15/21 1120  BP: 104/65 122/70 108/62 120/70  Pulse: 85 98 90 92  Resp: 18 19 18 17   Temp: 98.6 F (37 C) 98 F (36.7 C) 98.2 F (36.8 C) 97.9 F (36.6 C)  TempSrc: Oral Oral Oral Oral  SpO2: 96% 96% 96% 91%  Weight:      Height:        Intake/Output Summary (Last 24 hours) at 01/15/2021 1530 Last data filed at 01/15/2021 1509 Gross per 24 hour  Intake 717 ml  Output 2700 ml  Net -1983 ml    Filed Weights   01/12/21 1953  Weight: 87.6 kg     Exam:  General: Chronically ill-appearing man sitting up in bed in good spirits Eyes: sclera anicteric, conjuctiva mild injection bilaterally CVS: S1-S2, regular  Respiratory:  decreased air entry bilaterally secondary to decreased inspiratory effort, rales at bases  GI: NABS, soft, NT  LE: Patient's legs are bilaterally edematous with multiple shallow ulcers, ecchymoses as well as clear erythema bilaterally suggestive of cellulitis.  Psych: Patient has fluent speech but clearly has cognitive and memory deficits.  Assessment & Plan:   Possible altered mental status secondary to metabolic encephalopathy secondary to cellulitis I am not at all sure that he has metabolic encephalopathy as this may well be his baseline. I have tried to call patient's daughter [name] multiple  times to assess patient's baseline however have been unable to get through, phone is not excepting any voicemails and there is no answer--- I have been able to contact patient's daughter who notes that his mental status is back to his baseline. Neuro has signed off, does not think patient had TIA Continue aspirin and statin per neurology recommendations.  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 It is unclear to me if patient actually has cellulitis despite erythema of his lower extremities bilaterally, patient has been afebrile and WBC max was 11. I was able to speak with patient's daughter [name] who notes that the redness that she is seeing is his baseline and it is not new. Will discontinue vancomycin.  Hypomagnesemia at 1.3 Magnesium has been repleted however patient refused blood draw earlier today Will try to get blood to check magnesium  Hypotension Patient has low normal blood pressures at baseline Has responded well to fluid resuscitation  DM2 Continue low-dose SSI AC at bedtime  Disposition Patient's daughter states that she has been taking care of her father in this condition for many years and she is tired and unable to continue to do so. She would like the patient to be placed in a long-term care facility [facility] consult placed.   DVT prophylaxis:  Lovenox Code Status: Full Family Communication: None Disposition Plan:   Anticipated Discharge Location: TBD  Barriers to Discharge: Cellulitis  Is patient medically stable for Discharge: No   Scheduled Meds:  aspirin EC  81 mg Oral Daily   enoxaparin (LOVENOX) injection  40 mg Subcutaneous Q24H   insulin aspart  0-9 Units Subcutaneous TID WC & HS   LORazepam  1 mg Intravenous Once    pravastatin  20 mg Oral Daily   Continuous Infusions:  sodium chloride 75 mL/hr at 01/15/21 0849   vancomycin Stopped (01/15/21 0800)    Data Reviewed:  Basic Metabolic Panel: Recent Labs  Lab 01/12/21 1948 01/12/21 1953 01/13/21 0850 01/14/21 0116  NA 132* 134* 135 135  K 4.9 4.9 4.4 4.7  CL 108 108 111 111  CO2 16*  --  17* 15*  GLUCOSE 176* 174* 102* 98  BUN 37* 35* 35* 37*  CREATININE 1.32* 1.10 1.13 1.18  CALCIUM 8.4*  --  8.3* 8.2*  MG  --   --   --  1.3*     CBC: Recent Labs  Lab 01/12/21 1948 01/12/21 1953 01/13/21 0850 01/14/21 0116 01/14/21 0834  WBC 11.0*  --  8.4 8.0  --   NEUTROABS 7.5  --   --   --   --   HGB 10.9* 11.6* 9.2* 8.7* 8.6*  HCT 34.4* 34.0* 29.8* 27.1* 26.4*  MCV 101.5*  --  100.3* 97.8  --   PLT 208  --  193 201  --      Studies: No results found.  Principal Problem:   TIA (transient ischemic attack) Active Problems:   Essential hypertension   Diabetes mellitus without complication (HCC)   Wounds, multiple   Paraplegia ([place])   Bilateral cellulitis of lower leg     Dayjah Selman Tublu Lorette Peterkin, [facility]  If 7PM-7AM, please contact night-coverage [email or web address]   LOS: 2 days

## 2021-01-15 NOTE — Plan of Care (Signed)
°  Problem: Education: Goal: Knowledge of General Education information will improve Description: Including pain rating scale, medication(s)/side effects and non-pharmacologic comfort measures Outcome: Progressing   Problem: Education: Goal: Knowledge of disease or condition will improve Outcome: Progressing Goal: Knowledge of patient specific risk factors will improve (INDIVIDUALIZE FOR PATIENT) Outcome: Progressing   Problem: Coping: Goal: Will verbalize positive feelings about self Outcome: Progressing

## 2021-01-15 NOTE — Progress Notes (Signed)
Pt refuses tele patches and tele , also refused am labs and pulse ox. Pt was hesitant to allow for accucheck and refused the CNA but accepted nurse to do it.

## 2021-01-15 NOTE — Progress Notes (Signed)
Patient refusing blood draws and CBG for lunch and dinner. Patient states fingers are sore, and that he has given enough blood. MD Jamse Arn aware.

## 2021-01-15 NOTE — Progress Notes (Signed)
TRH night cross cover note:  I was notified by RN that the patient is refusing a.m. labs as well as current vital sign check.     Babs Bertin, DO Hospitalist

## 2021-01-15 NOTE — Consult Note (Signed)
[name] Nurse Consult Note: Reason for Consult: LE wounds Please see my note from 01/13/21 @ 9:28. [place] nurse has evaluated this patient while in the ED and his condition is out of our scope of practice.  Cathlean Marseilles Tamala Julian, MSN, RN, [place], Lysle Pearl, [facility] Associate Pager [phone]

## 2021-01-16 DIAGNOSIS — G459 Transient cerebral ischemic attack, unspecified: Secondary | ICD-10-CM | POA: Diagnosis not present

## 2021-01-16 LAB — GLUCOSE, CAPILLARY
Glucose-Capillary: 110 mg/dL — ABNORMAL HIGH (ref 70–99)
Glucose-Capillary: 204 mg/dL — ABNORMAL HIGH (ref 70–99)
Glucose-Capillary: 178 mg/dL — ABNORMAL HIGH (ref 70–99)
Glucose-Capillary: 147 mg/dL — ABNORMAL HIGH (ref 70–99)

## 2021-01-16 LAB — MAGNESIUM: Magnesium: 1.8 mg/dL (ref 1.7–2.4)

## 2021-01-16 MED ORDER — BENZONATATE 100 MG PO CAPS
100.0000 mg | ORAL_CAPSULE | Freq: Three times a day (TID) | ORAL | Status: DC | PRN
  Administered 2021-01-16 – 2021-01-18 (×3): 100 mg via ORAL
  Filled 2021-01-16 (×3): qty 1

## 2021-01-16 MED ORDER — GUAIFENESIN ER 600 MG PO TB12
600.0000 mg | ORAL_TABLET | Freq: Two times a day (BID) | ORAL | Status: DC | PRN
  Administered 2021-01-16 – 2021-01-17 (×2): 600 mg via ORAL
  Filled 2021-01-16 (×2): qty 1

## 2021-01-16 NOTE — Progress Notes (Signed)
PROGRESS NOTE    [name] [name]  TML:[id number]  DOB: 08-05-1945  DOA: 01/12/2021 PCP: Beckie Salts, MD Outpatient Specialists:   Hospital course:  76 year old man with paraplegia/functional quadriplegia, DM, PVD S/PE right elbow amputation was admitted yesterday for bilateral lower extremity cellulitis and possible right facial droop.   Subjective:  Patient states he is sleepy.  Notes he does not want to talk much.  Denies any pain.  Objective: Vitals:   01/16/21 0347 01/16/21 0808 01/16/21 1221 01/16/21 1619  BP: 114/61 128/76 (!) 149/74 112/68  Pulse: 79 85 79 91  Resp: 17 16 16 16   Temp: 98.5 F (36.9 C) 98 F (36.7 C) 98 F (36.7 C) 98 F (36.7 C)  TempSrc: Oral Oral Oral Oral  SpO2: 94% 98% 95% 96%  Weight:      Height:        Intake/Output Summary (Last 24 hours) at 01/16/2021 1703 Last data filed at 01/16/2021 1315 Gross per 24 hour  Intake 1260 ml  Output 3200 ml  Net -1940 ml    Filed Weights   01/12/21 1953  Weight: 87.6 kg     Exam:  General: Chronically ill-appearing man sleeping comfortably in bed. Eyes: sclera anicteric, conjuctiva mild injection bilaterally CVS: S1-S2, regular  Respiratory:  decreased air entry bilaterally secondary to decreased inspiratory effort, rales at bases  GI: NABS, soft, NT  LE: Patient's legs are bilaterally edematous with multiple shallow ulcers, ecchymoses as well as clear erythema bilaterally suggestive of cellulitis.  Psych: Patient has fluent speech but clearly has cognitive and memory deficits.  Assessment & Plan:   Altered mental status R/oh CVA R/O metabolic encephalopathy  Patient was noted to have twisting of the mouth and be leaning to right side. He was ruled out for stroke/TIA per neurology Neurology felt he may have metabolic encephalopathy but did not know what his baseline Patient's daughter yesterday states that his mental status is at baseline, notes he does have dementia and this is  without change since admission. Continue aspirin and statin  Bilateral lower extremity erythema secondary to intraepidermal acantholysis Patient has multiple wounds/ulcers on his markedly edematous lower extremities Patient had previously been seen by dermatology and diagnosed with intraepidermal a canthal lysis with dermal edema and mixed inflammatory infiltrate. Patient's daughter notes that his lower extremities are without change, he has chronic erythema. Antibiotics were discontinued yesterday and patient is clinically stable and afebrile  Hypomagnesemia at 1.3 Magnesium has been repleted and is now WNL at 1.8  Hypotension Patient has low normal blood pressures at baseline Has responded well to fluid resuscitation, presently normotensive  DM2 Blood sugars under reasonable control on low-dose SSI AC at bedtime  Disposition Patient's daughter states that she has been taking care of her father in this condition for many years and she is tired and unable to continue to do so. She would like the patient to be placed in a long-term care facility [facility] consult placed.   DVT prophylaxis: Lovenox Code Status: Full Family Communication: None Disposition Plan:   Anticipated Discharge Location: TBD  Barriers to Discharge: Cellulitis  Is patient medically stable for Discharge: No   Scheduled Meds:  aspirin EC  81 mg Oral Daily   enoxaparin (LOVENOX) injection  40 mg Subcutaneous Q24H   insulin aspart  0-9 Units Subcutaneous TID WC & HS   LORazepam  1 mg Intravenous Once   pravastatin  20 mg Oral Daily   Continuous Infusions:  sodium chloride 75 mL/hr  at 01/16/21 1315    Data Reviewed:  Basic Metabolic Panel: Recent Labs  Lab 01/12/21 1948 01/12/21 1953 01/13/21 0850 01/14/21 0116 01/16/21 0743  NA 132* 134* 135 135  --   K 4.9 4.9 4.4 4.7  --   CL 108 108 111 111  --   CO2 16*  --  17* 15*  --   GLUCOSE 176* 174* 102* 98  --   BUN 37* 35* 35* 37*  --   CREATININE  1.32* 1.10 1.13 1.18  --   CALCIUM 8.4*  --  8.3* 8.2*  --   MG  --   --   --  1.3* 1.8     CBC: Recent Labs  Lab 01/12/21 1948 01/12/21 1953 01/13/21 0850 01/14/21 0116 01/14/21 0834  WBC 11.0*  --  8.4 8.0  --   NEUTROABS 7.5  --   --   --   --   HGB 10.9* 11.6* 9.2* 8.7* 8.6*  HCT 34.4* 34.0* 29.8* 27.1* 26.4*  MCV 101.5*  --  100.3* 97.8  --   PLT 208  --  193 201  --      Studies: No results found.  Principal Problem:   TIA (transient ischemic attack) Active Problems:   Essential hypertension   Diabetes mellitus without complication (HCC)   Wounds, multiple   Paraplegia ([place])   Bilateral cellulitis of lower leg     Anaily Ashbaugh Tublu Reah Justo, [facility]  If 7PM-7AM, please contact night-coverage [email or web address]   LOS: 3 days

## 2021-01-16 NOTE — NC FL2 (Signed)
[place] LEVEL OF CARE SCREENING TOOL     IDENTIFICATION  Patient Name: [name] [name] Birthdate: 06-23-1945 Sex: adult Admission Date (Current Location): 01/12/2021  [facility] and [place] Number:  Herbalist and Address:  [facility] [place], [place] [address], [place], [place] [address]      Provider Number: [id number]  Attending Physician Name and Address:  Oren Binet*  Relative Name and Phone Number:       Current Level of Care: Hospital Recommended Level of Care: [place] Prior Approval Number:    Date Approved/Denied:   PASRR Number: [id number]  Discharge Plan: SNF    Current Diagnoses: Patient Active Problem List   Diagnosis Date Noted   TIA (transient ischemic attack) 01/13/2021   Bilateral cellulitis of lower leg 01/13/2021   Gangrene of hand ([place]) 09/06/2020   Hypokalemia 09/06/2020   Hypomagnesemia 09/06/2020   Cellulitis of right hand    Cellulitis of multiple sites 01/12/2020   Sepsis (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 ([place]) 38/25/0539   Metabolic acidosis 76/73/4193   Pressure injury of skin 09/25/2017   Depression 04/08/2015   Essential hypertension    Diabetes mellitus without complication (HCC)    HLD (hyperlipidemia)    Iron deficiency anemia    Hyperkalemia 02/27/2013   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[place]) 02/27/2013   Pressure ulcer of left heel 02/27/2013   Blister of foot 02/27/2013   Lower urinary tract infectious disease 02/21/2013   Ulcer of right ankle ([place]) 02/21/2013    Orientation RESPIRATION BLADDER  Height & Weight     Self, Place  Normal Incontinent, External catheter Weight: 193 lb 2 oz (87.6 kg) Height:  5\' 8"  (172.7 cm)  BEHAVIORAL SYMPTOMS/MOOD NEUROLOGICAL BOWEL NUTRITION STATUS      Incontinent Diet (see dc summary)  AMBULATORY STATUS COMMUNICATION OF NEEDS Skin   Extensive Assist Verbally Normal                       Personal Care Assistance Level of Assistance  Bathing, Feeding, Dressing Bathing Assistance: Maximum assistance Feeding assistance: Limited assistance Dressing Assistance: Maximum assistance     Functional Limitations Info             SPECIAL CARE FACTORS FREQUENCY  PT (By licensed PT)     PT Frequency: 3x/week              Contractures Contractures Info: Not present    Additional Factors Info  Code Status, Allergies, Insulin Sliding Scale Code Status Info: Full Allergies Info: Lisinopril   Insulin Sliding Scale Info: see dc summary       Current Medications (01/16/2021):  This is the current hospital active medication list Current Facility-Administered Medications  Medication Dose Route Frequency Provider Last Rate Last Admin   0.9 %  sodium chloride infusion   Intravenous Continuous Chotiner, Yevonne Aline, MD 75 mL/hr at 01/15/21 2215 New Bag at 01/15/21 2215   acetaminophen (TYLENOL) tablet 650 mg  650 mg Oral Q4H PRN Chotiner, Yevonne Aline, MD   650 mg at 01/14/21 0941   Or  acetaminophen (TYLENOL) 160 MG/5ML solution 650 mg  650 mg Per Tube Q4H PRN Chotiner, Yevonne Aline, MD       Or   acetaminophen (TYLENOL) suppository 650 mg  650 mg Rectal Q4H PRN Chotiner, Yevonne Aline, MD       aspirin EC tablet 81 mg  81 mg Oral Daily Shafer, [place], NP   81 mg at 01/16/21 7408   benzonatate (TESSALON) capsule 100 mg  100 mg Oral TID PRN Vashti Hey, MD       enoxaparin (LOVENOX) injection 40 mg  40 mg Subcutaneous Q24H Chotiner, Yevonne Aline, MD   40 mg at 01/16/21 0830   guaiFENesin (MUCINEX) 12 hr tablet 600 mg  600 mg Oral BID PRN  Bonnell Public Tublu, MD       insulin aspart (novoLOG) injection 0-9 Units  0-9 Units Subcutaneous TID WC & HS Chotiner, Yevonne Aline, MD   1 Units at 01/14/21 2050   LORazepam (ATIVAN) injection 1 mg  1 mg Intravenous Once Drenda Freeze, MD       pravastatin (PRAVACHOL) tablet 20 mg  20 mg Oral Daily Chotiner, Yevonne Aline, MD   20 mg at 01/16/21 1448   senna-docusate (Senokot-S) tablet 1 tablet  1 tablet Oral QHS PRN Chotiner, Yevonne Aline, MD         Discharge Medications: Please see discharge summary for a list of discharge medications.  Relevant Imaging Results:  Relevant Lab Results:   Additional Information SSN: [id number]. No COVID vaccines in system  [employer], LCSW

## 2021-01-16 NOTE — Care Management Important Message (Signed)
Important Message  Patient Details  Name: [name] [name] MRN: [id number] Date of Birth: 1945-07-08   Medicare Important Message Given:  Yes     Orbie Pyo 01/16/2021, 3:09 PM

## 2021-01-16 NOTE — TOC Progression Note (Signed)
Transition of Care [facility]) - Progression Note    Patient Details  Name: [name] [name] MRN: [id number] Date of Birth: [birth year]  Transition of Care [facility]) CM/SW [place], [place] Phone Number: 01/16/2021, 4:41 PM  Clinical Narrative:    Wandra Feinstein checking with their leadership to see if they can accept patient.    Expected Discharge Plan: Skilled Nursing Facility Barriers to Discharge: SNF Pending bed offer  Expected Discharge Plan and Services Expected Discharge Plan: [place] In-house Referral: Clinical Social Work   Post Acute Care Choice: [place] Living arrangements for the past 2 months: Apartment                                       Social Determinants of Health (SDOH) Interventions    Readmission Risk Interventions No flowsheet data found.

## 2021-01-16 NOTE — TOC Initial Note (Signed)
Transition of Care [facility]) - Initial/Assessment Note    Patient Details  Name: [name] [name] MRN: [id number] Date of Birth: January 31, 1945  Transition of [facility]) CM/SW Contact:    Benard Halsted, LCSW Phone Number: 01/16/2021, 9:14 AM  Clinical Narrative:                 CSW received consult for possible SNF placement at time of discharge. CSW spoke with patient's daughter, [name] [facility]). She reported that she has been patient's caregiver but she is currently unable to care for patient at their home any longer. She stated she needs time to heal herself before she can consider bringing him back home. She is requesting long term care SNF placement in [place] at time of discharge. CSW discussed insurance process and will provide Medicare SNF ratings list. Patient with Medicaid secondary; CSW will have SNFs confirm if Medicaid is eligible for ltc placement.   Skilled Nursing Rehab Facilities-   [email or web address]   Ratings out of 5 possible   Name Address  Phone # [place] Inspection Overall  [facility] [address] [place] 5 5 2 4   [facility]  [address] [place], [place] [phone] 4 2 5 5   [facility] [place], [place] 4 1 1 1   [place], [place] 2 2 4 4   [facility] [address] [place] 2 1 1 1   [place] 3 1 4 3   [facility] [address], [place] 5 2 2 3   [facility] [address], [place] 4 1 2 1   [facility] [place], [place] [phone] 5 1 2 2   [facility] [phone], [place] [phone] 4 1 1 1   [facility] [address] [facility] [phone] 4 1 2 1   [place] [address] [place] 4 1 1 1        Expected Discharge Plan: Skilled Nursing Facility Barriers to Discharge: SNF Pending bed offer   Patient Goals and CMS Choice Patient states their goals for this hospitalization and ongoing recovery are:: LTC facility CMS Medicare.gov Compare Post Acute Care list provided to:: Patient Represenative (must comment) Choice offered to / list presented to : Adult Children, HC POA / Guardian  Expected Discharge Plan and Services Expected Discharge Plan: [place] In-house Referral: Clinical Social Work   Post Acute Care Choice: [place] Living arrangements for the past 2 months: Apartment                                      Prior Living Arrangements/Services Living arrangements for the past 2 months: Apartment Lives with:: Adult Children Patient language and need for interpreter reviewed:: Yes Do you feel safe going back to the place where you live?: Yes      Need for Family Participation in Patient Care: Yes (Comment) Care giver support system in place?: Yes (comment) Current home services: DME Criminal Activity/Legal Involvement Pertinent to Current Situation/Hospitalization: No - Comment as needed  Activities of Daily Living Home Assistive Devices/Equipment: Hospital bed ADL Screening (condition at time of admission) Patient's cognitive ability adequate to safely complete daily activities?: No Is the patient deaf or have difficulty hearing?: No Does the patient have difficulty seeing,  even when wearing glasses/contacts?: Yes Does the patient have difficulty concentrating, remembering, or making decisions?: Yes Patient able to express need for assistance with ADLs?: No Does the patient have difficulty dressing or bathing?: Yes Independently performs ADLs?: No Communication: Independent Dressing (OT): Dependent Is this a change from baseline?: Pre-admission baseline Grooming: Dependent Is this a change from baseline?:  Pre-admission baseline Feeding: Dependent Is this a change from baseline?: Pre-admission baseline Bathing: Dependent Is this a change from baseline?: Pre-admission baseline Toileting: Dependent Is this a change from baseline?: Pre-admission baseline In/Out Bed: Dependent Is this a change from baseline?: Pre-admission baseline Walks in Home: Dependent Is this a change from baseline?: Pre-admission baseline Does the patient have difficulty walking or climbing stairs?: Yes Weakness of Legs: Both Weakness of Arms/Hands: Both  Permission Sought/Granted Permission sought to share information with : Facility Sport and exercise psychologist, Family Supports Permission granted to share information with : Yes, Verbal Permission Granted  Share Information with NAME: [name]  Permission granted to share info w AGENCY: SNFs  Permission granted to share info w Relationship: Daughter  Permission granted to share info w Contact Information: [phone]  Emotional Assessment Appearance:: Appears stated age Attitude/Demeanor/Rapport: Unable to Assess Affect (typically observed): Unable to Assess Orientation: : Oriented to Self, Oriented to Place Alcohol / Substance Use: Not Applicable Psych Involvement: No (comment)  Admission diagnosis:  TIA (transient ischemic attack) [G45.9] Patient Active Problem List   Diagnosis Date Noted   TIA (transient ischemic attack) 01/13/2021   Bilateral cellulitis of lower leg 01/13/2021   Gangrene of hand ([place]) 09/06/2020   Hypokalemia 09/06/2020   Hypomagnesemia 09/06/2020   Cellulitis of right hand    Cellulitis of multiple sites 01/12/2020   Sepsis ([place]) 01/12/2020   COVID-19 virus infection 01/12/2020   Neurocognitive deficits 12/03/2017   Lobar pneumonia ([place]) 11/30/2017   Anemia of chronic disease 11/30/2017   Open wound of right hand with complication 40/97/3532   Malnutrition of moderate degree 11/18/2017   Wounds, multiple 11/16/2017   Multiple wounds  11/16/2017   Paraplegia ([place]) 11/16/2017   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 ([place]) 99/24/2683   Metabolic acidosis 41/96/2229   Pressure injury of skin 09/25/2017   Depression 04/08/2015   Essential hypertension    Diabetes mellitus without complication (HCC)    HLD (hyperlipidemia)    Iron deficiency anemia    Hyperkalemia 02/27/2013   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[place]) 02/27/2013   Pressure ulcer of left heel 02/27/2013   Blister of foot 02/27/2013   Lower urinary tract infectious disease 02/21/2013   Ulcer of right ankle ([place]) 02/21/2013   PCP:  Beckie Salts, MD Pharmacy:   [id number] [place], [place] [address] [place] [address] Phone: [phone] Fax: [phone]     Social Determinants of Health (SDOH) Interventions    Readmission Risk Interventions No flowsheet data found.

## 2021-01-17 DIAGNOSIS — G459 Transient cerebral ischemic attack, unspecified: Secondary | ICD-10-CM | POA: Diagnosis not present

## 2021-01-17 LAB — GLUCOSE, CAPILLARY
Glucose-Capillary: 92 mg/dL (ref 70–99)
Glucose-Capillary: 134 mg/dL — ABNORMAL HIGH (ref 70–99)
Glucose-Capillary: 129 mg/dL — ABNORMAL HIGH (ref 70–99)
Glucose-Capillary: 166 mg/dL — ABNORMAL HIGH (ref 70–99)

## 2021-01-17 MED ORDER — COVID-19 MRNA VAC-TRIS(PFIZER) 30 MCG/0.3ML IM SUSP
0.3000 mL | Freq: Once | INTRAMUSCULAR | Status: AC
  Administered 2021-01-17: 0.3 mL via INTRAMUSCULAR
  Filled 2021-01-17: qty 0.3

## 2021-01-17 NOTE — TOC Progression Note (Addendum)
Transition of Care [facility]) - Progression Note    Patient Details  Name: [name] [name] MRN: [id number] Date of Birth: 1945-09-20  Transition of Care [facility]) CM/SW [place], LCSW Phone Number: 01/17/2021, 9:08 AM  Clinical Narrative:    9am-[facility] reviewing referral.   11:48am-Per [facility], ltc patients need to be fully COVID vaccinated. CSW confirmed with patient's daughter that patient has not had any vaccines. CSW will follow up with [facility] and will request first vaccine if patient can receive; daughter in agreement.   Expected Discharge Plan: Skilled Nursing Facility Barriers to Discharge: SNF Pending bed offer  Expected Discharge Plan and Services Expected Discharge Plan: [place] In-house Referral: Clinical Social Work   Post Acute Care Choice: [place] Living arrangements for the past 2 months: Apartment                                       Social Determinants of Health (SDOH) Interventions    Readmission Risk Interventions No flowsheet data found.

## 2021-01-17 NOTE — Progress Notes (Addendum)
PROGRESS NOTE    [name] [name]  KGY:[id number]  DOB: August 07, 1945  DOA: 01/12/2021 PCP: Beckie Salts, MD Outpatient Specialists:   Hospital course:  76 year old man with paraplegia/functional quadriplegia, DM, PVD S/PE right elbow amputation was admitted yesterday for bilateral lower extremity cellulitis and possible right facial droop.   Subjective:  No complaints, states he is doing okay.  Objective: Vitals:   01/17/21 0100 01/17/21 0400 01/17/21 0754 01/17/21 1144  BP:  112/72 123/72 139/74  Pulse: 74 83 73 87  Resp:  20 16 16   Temp:  98.9 F (37.2 C) 98 F (36.7 C) 98 F (36.7 C)  TempSrc:  Axillary Axillary Axillary  SpO2: 96% 95% 96% 93%  Weight:      Height:        Intake/Output Summary (Last 24 hours) at 01/17/2021 1512 Last data filed at 01/17/2021 0755 Gross per 24 hour  Intake 480 ml  Output 2000 ml  Net -1520 ml    Filed Weights   01/12/21 1953  Weight: 87.6 kg     Exam:  General: Chronically ill-appearing man sleeping comfortably in bed. Eyes: sclera anicteric, conjuctiva mild injection bilaterally CVS: S1-S2, regular  Respiratory:  decreased air entry bilaterally secondary to decreased inspiratory effort, rales at bases  GI: NABS, soft, NT  LE: Patient's legs are bilaterally edematous with multiple shallow ulcers, ecchymoses as well as clear erythema bilaterally suggestive of cellulitis.  Psych: Patient has fluent speech but clearly has cognitive and memory deficits.  Assessment & Plan:   76 year old quadriplegic awaiting placement in SNF.   Altered mental status R/oh CVA R/O metabolic encephalopathy  Patient admitted for twisting of the mouth and be leaning to right side. He was ruled out for stroke/TIA per neurology Neurology felt he may have metabolic encephalopathy but did not know what his baseline Patient's daughter states that his mental status is at baseline, notes he does have dementia and this is without change since  admission. Continue aspirin and statin  Bilateral lower extremity erythema secondary to intraepidermal acantholysis Patient has multiple wounds/ulcers on his markedly edematous lower extremities. He was initially thought to have vancomycin.  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 Patient's daughter notes that his lower extremities are without change, he has chronic erythema. Antibiotics were discontinued yesterday and patient is clinically stable and afebrile  Hypomagnesemia at 1.3 Magnesium has been repleted and is now WNL at 1.8  Hypotension Patient has low normal blood pressures at baseline Has responded well to fluid resuscitation, presently normotensive  DM2 Blood sugars under reasonable control on low-dose SSI AC at bedtime  Disposition Patient's daughter states that she has been taking care of her father in this condition for many years and she is tired and unable to continue to do so. She would like the patient to be placed in a long-term care facility [facility] actively seeking placement at SNF COVID-vaccine #1 was given today 01/17/2021   DVT prophylaxis: Lovenox Code Status: Full Family Communication: None Disposition Plan:   Anticipated Discharge Location: SNF  Barriers to Discharge: SNF  Is patient medically stable for Discharge: Yes   Scheduled Meds:  aspirin EC  81 mg Oral Daily   COVID-19 mRNA Vac-TriS (Pfizer)  0.3 mL Intramuscular Once   enoxaparin (LOVENOX) injection  40 mg Subcutaneous Q24H   insulin aspart  0-9 Units Subcutaneous  TID WC & HS   LORazepam  1 mg Intravenous Once   pravastatin  20 mg Oral Daily   Continuous Infusions:  sodium chloride 75 mL/hr at 01/17/21 0242    Data Reviewed:  Basic Metabolic Panel: Recent Labs  Lab 01/12/21 1948 01/12/21 1953  01/13/21 0850 01/14/21 0116 01/16/21 0743  NA 132* 134* 135 135  --   K 4.9 4.9 4.4 4.7  --   CL 108 108 111 111  --   CO2 16*  --  17* 15*  --   GLUCOSE 176* 174* 102* 98  --   BUN 37* 35* 35* 37*  --   CREATININE 1.32* 1.10 1.13 1.18  --   CALCIUM 8.4*  --  8.3* 8.2*  --   MG  --   --   --  1.3* 1.8     CBC: Recent Labs  Lab 01/12/21 1948 01/12/21 1953 01/13/21 0850 01/14/21 0116 01/14/21 0834  WBC 11.0*  --  8.4 8.0  --   NEUTROABS 7.5  --   --   --   --   HGB 10.9* 11.6* 9.2* 8.7* 8.6*  HCT 34.4* 34.0* 29.8* 27.1* 26.4*  MCV 101.5*  --  100.3* 97.8  --   PLT 208  --  193 201  --      Studies: No results found.  Principal Problem:   TIA (transient ischemic attack) Active Problems:   Essential hypertension   Diabetes mellitus without complication (HCC)   Wounds, multiple   Paraplegia ([place])   Bilateral cellulitis of lower leg     [name] [name], [facility]  If 7PM-7AM, please contact night-coverage [email or web address]   LOS: 4 days

## 2021-01-18 ENCOUNTER — Inpatient Hospital Stay (HOSPITAL_COMMUNITY): Payer: Medicare Other

## 2021-01-18 LAB — TYPE AND SCREEN
ABO/RH(D): O POS
Antibody Screen: NEGATIVE

## 2021-01-18 LAB — CBC WITH DIFFERENTIAL/PLATELET
Abs Immature Granulocytes: 0.55 10*3/uL — ABNORMAL HIGH (ref 0.00–0.07)
Basophils Absolute: 0 10*3/uL (ref 0.0–0.1)
Basophils Relative: 0 %
Eosinophils Absolute: 0 10*3/uL (ref 0.0–0.5)
Eosinophils Relative: 0 %
HCT: 27.9 % — ABNORMAL LOW (ref 39.0–52.0)
Hemoglobin: 8.8 g/dL — ABNORMAL LOW (ref 13.0–17.0)
Immature Granulocytes: 3 %
Lymphocytes Relative: 2 %
Lymphs Abs: 0.4 10*3/uL — ABNORMAL LOW (ref 0.7–4.0)
MCH: 31.9 pg (ref 26.0–34.0)
MCHC: 31.5 g/dL (ref 30.0–36.0)
MCV: 101.1 fL — ABNORMAL HIGH (ref 80.0–100.0)
Monocytes Absolute: 0.4 10*3/uL (ref 0.1–1.0)
Monocytes Relative: 2 %
Neutro Abs: 20.4 10*3/uL — ABNORMAL HIGH (ref 1.7–7.7)
Neutrophils Relative %: 93 %
Platelets: 260 10*3/uL (ref 150–400)
RBC: 2.76 MIL/uL — ABNORMAL LOW (ref 4.22–5.81)
RDW: 13.9 % (ref 11.5–15.5)
WBC: 21.8 10*3/uL — ABNORMAL HIGH (ref 4.0–10.5)
nRBC: 0.2 % (ref 0.0–0.2)

## 2021-01-18 LAB — BASIC METABOLIC PANEL
Anion gap: 7 (ref 5–15)
BUN: 28 mg/dL — ABNORMAL HIGH (ref 8–23)
CO2: 15 mmol/L — ABNORMAL LOW (ref 22–32)
Calcium: 8.2 mg/dL — ABNORMAL LOW (ref 8.9–10.3)
Chloride: 115 mmol/L — ABNORMAL HIGH (ref 98–111)
Creatinine, Ser: 0.87 mg/dL (ref 0.61–1.24)
GFR, Estimated: >60 mL/min (ref >60)
Glucose, Bld: 115 mg/dL — ABNORMAL HIGH (ref 70–99)
Potassium: 5.4 mmol/L — ABNORMAL HIGH (ref 3.5–5.1)
Sodium: 137 mmol/L (ref 135–145)

## 2021-01-18 LAB — BRAIN NATRIURETIC PEPTIDE: B Natriuretic Peptide: 205.9 pg/mL — ABNORMAL HIGH (ref 0.0–100.0)

## 2021-01-18 LAB — MRSA NEXT GEN BY PCR, NASAL: MRSA by PCR Next Gen: NOT DETECTED

## 2021-01-18 LAB — GLUCOSE, CAPILLARY
Glucose-Capillary: 107 mg/dL — ABNORMAL HIGH (ref 70–99)
Glucose-Capillary: 181 mg/dL — ABNORMAL HIGH (ref 70–99)
Glucose-Capillary: 150 mg/dL — ABNORMAL HIGH (ref 70–99)
Glucose-Capillary: 162 mg/dL — ABNORMAL HIGH (ref 70–99)

## 2021-01-18 LAB — TSH: TSH: 1.57 u[IU]/mL (ref 0.350–4.500)

## 2021-01-18 LAB — CORTISOL: Cortisol, Plasma: 16.5 ug/dL

## 2021-01-18 LAB — PROCALCITONIN: Procalcitonin: 71.6 ng/mL

## 2021-01-18 LAB — MAGNESIUM: Magnesium: 1.4 mg/dL — ABNORMAL LOW (ref 1.7–2.4)

## 2021-01-18 IMAGING — DX DG CHEST 1V PORT
1 series · 1 of 1 positions shown · non-contrast
Comparison: [DATE]

CLINICAL DATA: Eval for SOB, pt seems to have a congested cough -
hx of diabetes, htn - pt unable to lay completely flat and head wont
go up out of chest so best obtainable image

EXAM:
PORTABLE CHEST - 1 VIEW

[chest ap]
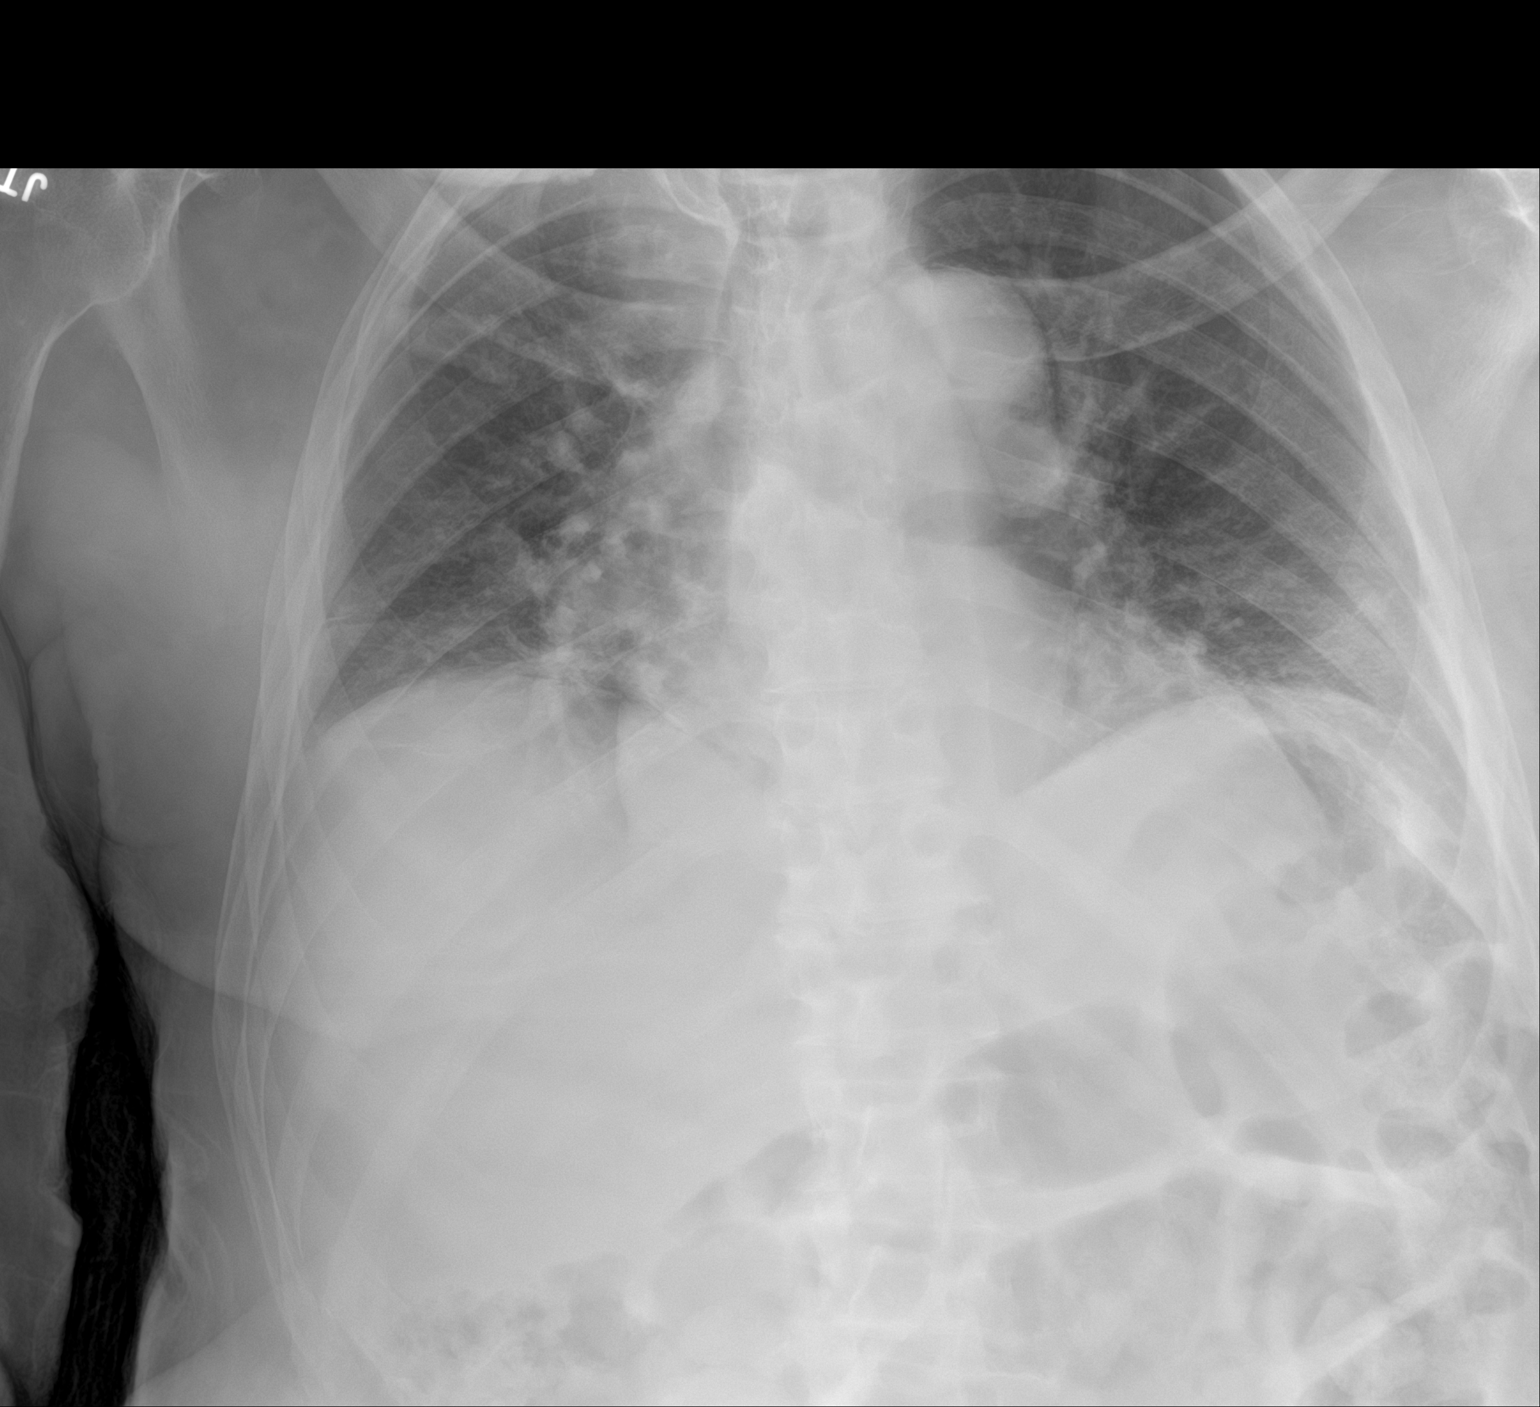

[1 of 1 positions shown; findings below may reference images not displayed]

FINDINGS: Relatively low lung volumes as before. Worsening ill-defined
airspace and interstitial opacities in both lung bases. Suspect mild
central pulmonary vascular congestion.

Heart size and mediastinal contours are within normal limits.

Persistent mild blunting of the right lateral costophrenic angle.

Visualized bones unremarkable.
IMPRESSION: Worsening  perihilar and bibasilar edema or infiltrates.

## 2021-01-18 MED ORDER — LACTATED RINGERS IV SOLN: INTRAVENOUS | Status: DC

## 2021-01-18 MED ORDER — SODIUM ZIRCONIUM CYCLOSILICATE 10 G PO PACK
10.0000 g | PACK | Freq: Three times a day (TID) | ORAL | Status: AC
  Administered 2021-01-18 – 2021-01-19 (×3): 10 g via ORAL
  Filled 2021-01-18 (×3): qty 1

## 2021-01-18 MED ORDER — SODIUM CHLORIDE 0.9 % IV SOLN
3.0000 g | Freq: Four times a day (QID) | INTRAVENOUS | Status: DC
  Administered 2021-01-18 – 2021-01-19 (×4): 3 g via INTRAVENOUS
  Filled 2021-01-18 (×4): qty 8

## 2021-01-18 MED ORDER — LACTATED RINGERS IV BOLUS
500.0000 mL | Freq: Once | INTRAVENOUS | Status: AC
  Administered 2021-01-18: 500 mL via INTRAVENOUS

## 2021-01-18 MED ORDER — MIDODRINE HCL 5 MG PO TABS
5.0000 mg | ORAL_TABLET | Freq: Three times a day (TID) | ORAL | Status: DC
  Administered 2021-01-18 (×2): 5 mg via ORAL
  Filled 2021-01-18 (×2): qty 1

## 2021-01-18 MED ORDER — LACTATED RINGERS IV BOLUS
1000.0000 mL | Freq: Once | INTRAVENOUS | Status: AC
  Administered 2021-01-18: 1000 mL via INTRAVENOUS

## 2021-01-18 MED ORDER — MIDODRINE HCL 5 MG PO TABS
10.0000 mg | ORAL_TABLET | Freq: Three times a day (TID) | ORAL | Status: DC
  Administered 2021-01-18 – 2021-01-20 (×7): 10 mg via ORAL
  Filled 2021-01-18 (×8): qty 2

## 2021-01-18 MED ORDER — MAGNESIUM SULFATE 2 GM/50ML IV SOLN
2.0000 g | Freq: Once | INTRAVENOUS | Status: AC
  Administered 2021-01-18: 2 g via INTRAVENOUS
  Filled 2021-01-18: qty 50

## 2021-01-18 MED ORDER — LACTATED RINGERS IV BOLUS: 500.0000 mL | Freq: Once | INTRAVENOUS | Status: DC

## 2021-01-18 NOTE — Progress Notes (Signed)
Pharmacy Antibiotic Note  [name] [name] is 76 year old man with paraplegia/functional quadriplegia, DM, PVD S/PE right elbow amputation who presented with bilateral lower extremity cellulitis and possible right facial droop. Recently completed duration of vancomycin for cellulitis. Now has elevated WBC @ 22 following aspiration event and suspected sepsis. Pharmacy has been consulted for Unasyn dosing.  Plan: Unasyn 3g Q6h F/u cultures, clinical improvement   Height: 5\' 8"  (172.7 cm) Weight: 87.6 kg (193 lb 2 oz) IBW/kg (Calculated) : 68.4  Temp (24hrs), Avg:98.4 F (36.9 C), Min:97.5 F (36.4 C), Max:99.2 F (37.3 C)  Recent Labs  Lab 01/12/21 1948 01/12/21 1953 01/13/21 0850 01/14/21 0116 01/18/21 0726 01/18/21 0731  WBC 11.0*  --  8.4 8.0  --  21.8*  CREATININE 1.32* 1.10 1.13 1.18 0.87  --     Estimated Creatinine Clearance (by C-G formula based on SCr of 0.87 mg/dL) Male: 64.7 mL/min Male: 79 mL/min    Allergies  Allergen Reactions   Lisinopril Other (See Comments)    Hyperkalemia    Antimicrobials this admission: Vancomycin 12/30 >> 01/01  Microbiology results: 12/30 MRSA PCR: not detected   Thank you for allowing pharmacy to be a part of this patients care.  Ardyth Harps, PharmD Clinical Pharmacist

## 2021-01-18 NOTE — Plan of Care (Signed)
  Problem: Education: Goal: Knowledge of disease or condition will improve Outcome: Progressing   Problem: Education: Goal: Knowledge of patient specific risk factors will improve (INDIVIDUALIZE FOR PATIENT) Outcome: Progressing

## 2021-01-18 NOTE — Plan of Care (Signed)
  Problem: Safety: Goal: Ability to remain free from injury will improve Outcome: Progressing   Problem: Skin Integrity: Goal: Risk for impaired skin integrity will decrease Outcome: Progressing   

## 2021-01-18 NOTE — TOC Progression Note (Signed)
Transition of Care [facility]) - Progression Note    Patient Details  Name: BILLYE NYDAM MRN: [id number] Date of Birth: Aug 26, 1945  Transition of Care [facility]) CM/SW [place], LCSW Phone Number: 01/18/2021, 4:31 PM  Clinical Narrative:    CSW requested [place] and [place] review referral.   Expected Discharge Plan: [place] Barriers to Discharge: SNF Pending bed offer  Expected Discharge Plan and Services Expected Discharge Plan: [place] In-house Referral: Clinical Social Work   Post Acute Care Choice: [place] Living arrangements for the past 2 months: Apartment                                       Social Determinants of Health (SDOH) Interventions    Readmission Risk Interventions No flowsheet data found.

## 2021-01-18 NOTE — Progress Notes (Signed)
PROGRESS NOTE    [name] [name]  [id number]  DOB: 1945/11/16  DOA: 01/12/2021 PCP: Beckie Salts, MD Outpatient Specialists:   Hospital course:  76 year old man with paraplegia/functional quadriplegia, DM, PVD S/PE right elbow amputation was admitted yesterday for bilateral lower extremity cellulitis and possible right facial droop.   Subjective:  Patient in bed, appears comfortable, denies any headache, no fever, no chest pain or pressure, no shortness of breath , no abdominal pain. No new focal weakness.   Objective: Vitals:   01/18/21 0809 01/18/21 0915 01/18/21 1016 01/18/21 1208  BP: (!) 86/53 101/60 (!) 84/52 99/60  Pulse: (!) 102 (!) 106 94 92  Resp: 20  20 18   Temp: 98.9 F (37.2 C)  99.2 F (37.3 C) 98.8 F (37.1 C)  TempSrc:   Oral Oral  SpO2: 92% 92% 91% 93%  Weight:      Height:        Intake/Output Summary (Last 24 hours) at 01/18/2021 1456 Last data filed at 01/18/2021 1040 Gross per 24 hour  Intake 560 ml  Output 500 ml  Net 60 ml   Filed Weights   01/12/21 1953  Weight: 87.6 kg     Exam:  Awake Alert, No new F.N deficits, patient in bed, appears comfortable, denies any headache, no fever, no chest pain or pressure, no shortness of breath , no abdominal pain. No focal weakness. 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   Assessment & Plan:   Altered mental status R/oh CVA R/O metabolic encephalopathy - he was seen by neurology and this was thought to be due to metabolic encephalopathy.  Currently on aspirin and statin for secondary prevention and close to baseline.  Aspiration with  hypotension and early sepsis on 01/18/2021.  Patient has no teeth and was trying to chew regular diet, chest x-ray is inconclusive however from a normal WBC count his WBC count has gone up to 21,000 with hypotension, he has been started on IV fluid bolus 2 L along with maintenance IV fluids, check blood cultures, MRSA nasal PCR, soft diet, speech eval, Unasyn empirically for now and monitor.   Hypotension - see above.  Random cortisol and TSH stable, added midodrine to IV fluids  Bilateral lower extremity erythema secondary to intraepidermal acantholysis -  Patient has multiple wounds/ulcers on his markedly edematous lower extremities.  Will briefly treated with antibiotics but after previous MD discussed with the daughter it seems like these like changes are chronic and antibiotics were stopped  Hypomagnesemia.  Replace.    Hyperkalemia.  Lokelma 3 doses.      DM2 Blood sugars under reasonable control on low-dose SSI AC at bedtime  Lab Results  Component Value Date   HGBA1C 7.6 (H) 01/13/2021   CBG (last 3)  Recent Labs    01/17/21 2033 01/18/21 0804 01/18/21 1207  GLUCAP 166* 107* 181*     Disposition Await SNF bed  COVID-vaccine #1 was given today 01/17/2021   DVT prophylaxis: Lovenox Code Status: Full Family Communication: None Disposition Plan:   Anticipated Discharge Location: SNF  Barriers to Discharge: SNF  Is patient medically stable for Discharge: Yes   Scheduled Meds:  aspirin  EC  81 mg Oral Daily   enoxaparin (LOVENOX) injection  40 mg Subcutaneous Q24H   insulin aspart  0-9 Units Subcutaneous TID WC & HS   LORazepam  1 mg Intravenous Once   midodrine  10 mg Oral TID WC   pravastatin  20 mg Oral Daily   sodium zirconium cyclosilicate  10 g Oral TID   Continuous Infusions:  lactated ringers     lactated ringers      Data Reviewed: Recent Labs  Lab 01/12/21 1948 01/12/21 1953 01/13/21 0850 01/14/21 0116 01/14/21 0834 01/18/21 0731  WBC 11.0*  --   8.4 8.0  --  21.8*  HGB 10.9* 11.6* 9.2* 8.7* 8.6* 8.8*  HCT 34.4* 34.0* 29.8* 27.1* 26.4* 27.9*  PLT 208  --  193 201  --  260  MCV 101.5*  --  100.3* 97.8  --  101.1*  MCH 32.2  --  31.0 31.4  --  31.9  MCHC 31.7  --  30.9 32.1  --  31.5  RDW 13.9  --  13.8 13.5  --  13.9  LYMPHSABS 2.2  --   --   --   --  0.4*  MONOABS 1.1*  --   --   --   --  0.4  EOSABS 0.2  --   --   --   --  0.0  BASOSABS 0.0  --   --   --   --  0.0    Recent Labs  Lab 01/12/21 1948 01/12/21 1953 01/13/21 0850 01/14/21 0116 01/16/21 0743 01/18/21 0726 01/18/21 0731 01/18/21 1000  NA 132* 134* 135 135  --  137  --   --   K 4.9 4.9 4.4 4.7  --  5.4*  --   --   CL 108 108 111 111  --  115*  --   --   CO2 16*  --  17* 15*  --  15*  --   --   GLUCOSE 176* 174* 102* 98  --  115*  --   --   BUN 37* 35* 35* 37*  --  28*  --   --   CREATININE 1.32* 1.10 1.13 1.18  --  0.87  --   --   CALCIUM 8.4*  --  8.3* 8.2*  --  8.2*  --   --   AST 18  --   --   --   --   --   --   --   ALT 17  --   --   --   --   --   --   --   ALKPHOS 88  --   --   --   --   --   --   --   BILITOT 0.7  --   --   --   --   --   --   --   ALBUMIN 2.6*  --   --   --   --   --   --   --   MG  --   --   --  1.3* 1.8 1.4*  --   --   INR 1.2  --   --   --   --   --   --   --   TSH  --   --   --   --   --   --   --  1.570  HGBA1C  --   --  7.6*  --   --   --   --   --   BNP  --   --   --   --   --   --  205.9*  --         Await SNF bed. Studies: DG Chest Port 1 View  Result Date: 01/18/2021 CLINICAL DATA:  Eval for SOB, pt seems to have a congested cough - hx of diabetes, htn - pt unable to lay completely flat and head wont go up out of chest so best obtainable image EXAM: PORTABLE CHEST - 1 VIEW COMPARISON:  01/12/2021 FINDINGS: Relatively low lung volumes as before. Worsening ill-defined airspace and interstitial opacities in both lung bases. Suspect mild central pulmonary vascular congestion. Heart size and mediastinal contours are  within normal limits. Persistent mild blunting of the right lateral costophrenic angle. Visualized bones unremarkable. IMPRESSION: Worsening  perihilar and bibasilar edema or infiltrates. Electronically Signed   By: Lucrezia Europe M.D.   On: 01/18/2021 07:57    Principal Problem:   TIA (transient ischemic attack) Active Problems:   Essential hypertension   Diabetes mellitus without complication (HCC)   Wounds, multiple   Paraplegia (HCC)   Bilateral cellulitis of lower leg  Signature  Lala Lund M.D on 01/18/2021 at 2:56 PM   -  To page go to [email or web address]        LOS: 5 days

## 2021-01-18 NOTE — Progress Notes (Signed)
°  X-cover Note: Received call from Therapist, sports. Blood pressures are running 81/46. Appears rounding team aware. Pt on IVF and midodrine. Appears that pt's hgB is drifting downward over the last month. Will order type and screen for the AM in case rounding team wants to consider transfusion.   Kristopher Oppenheim, DO [facility]

## 2021-01-19 ENCOUNTER — Inpatient Hospital Stay (HOSPITAL_COMMUNITY): Payer: Medicare Other

## 2021-01-19 LAB — COMPREHENSIVE METABOLIC PANEL
ALT: 26 U/L (ref 0–44)
AST: 28 U/L (ref 15–41)
Albumin: 2 g/dL — ABNORMAL LOW (ref 3.5–5.0)
Alkaline Phosphatase: 83 U/L (ref 38–126)
Anion gap: 7 (ref 5–15)
BUN: 33 mg/dL — ABNORMAL HIGH (ref 8–23)
CO2: 15 mmol/L — ABNORMAL LOW (ref 22–32)
Calcium: 8.1 mg/dL — ABNORMAL LOW (ref 8.9–10.3)
Chloride: 113 mmol/L — ABNORMAL HIGH (ref 98–111)
Creatinine, Ser: 1.12 mg/dL (ref 0.61–1.24)
GFR, Estimated: >60 mL/min (ref >60)
Glucose, Bld: 122 mg/dL — ABNORMAL HIGH (ref 70–99)
Potassium: 4.9 mmol/L (ref 3.5–5.1)
Sodium: 135 mmol/L (ref 135–145)
Total Bilirubin: 0.4 mg/dL (ref 0.3–1.2)
Total Protein: 6 g/dL — ABNORMAL LOW (ref 6.5–8.1)

## 2021-01-19 LAB — BLOOD CULTURE ID PANEL (REFLEXED) - BCID2

## 2021-01-19 LAB — CBC WITH DIFFERENTIAL/PLATELET
Abs Immature Granulocytes: 0.23 10*3/uL — ABNORMAL HIGH (ref 0.00–0.07)
Basophils Absolute: 0.1 10*3/uL (ref 0.0–0.1)
Basophils Relative: 0 %
Eosinophils Absolute: 0.3 10*3/uL (ref 0.0–0.5)
Eosinophils Relative: 2 %
HCT: 24.8 % — ABNORMAL LOW (ref 39.0–52.0)
Hemoglobin: 8 g/dL — ABNORMAL LOW (ref 13.0–17.0)
Immature Granulocytes: 2 %
Lymphocytes Relative: 8 %
Lymphs Abs: 1.2 10*3/uL (ref 0.7–4.0)
MCH: 31.5 pg (ref 26.0–34.0)
MCHC: 32.3 g/dL (ref 30.0–36.0)
MCV: 97.6 fL (ref 80.0–100.0)
Monocytes Absolute: 0.9 10*3/uL (ref 0.1–1.0)
Monocytes Relative: 6 %
Neutro Abs: 13 10*3/uL — ABNORMAL HIGH (ref 1.7–7.7)
Neutrophils Relative %: 82 %
Platelets: 270 10*3/uL (ref 150–400)
RBC: 2.54 MIL/uL — ABNORMAL LOW (ref 4.22–5.81)
RDW: 13.8 % (ref 11.5–15.5)
WBC: 15.7 10*3/uL — ABNORMAL HIGH (ref 4.0–10.5)
nRBC: 0.3 % — ABNORMAL HIGH (ref 0.0–0.2)

## 2021-01-19 LAB — BRAIN NATRIURETIC PEPTIDE: B Natriuretic Peptide: 250.5 pg/mL — ABNORMAL HIGH (ref 0.0–100.0)

## 2021-01-19 LAB — GLUCOSE, CAPILLARY
Glucose-Capillary: 112 mg/dL — ABNORMAL HIGH (ref 70–99)
Glucose-Capillary: 156 mg/dL — ABNORMAL HIGH (ref 70–99)
Glucose-Capillary: 167 mg/dL — ABNORMAL HIGH (ref 70–99)
Glucose-Capillary: 235 mg/dL — ABNORMAL HIGH (ref 70–99)
Glucose-Capillary: 239 mg/dL — ABNORMAL HIGH (ref 70–99)

## 2021-01-19 LAB — MAGNESIUM: Magnesium: 1.8 mg/dL (ref 1.7–2.4)

## 2021-01-19 LAB — PROCALCITONIN: Procalcitonin: 84.66 ng/mL

## 2021-01-19 IMAGING — DX DG CHEST 1V PORT
1 series · 1 of 1 positions shown · non-contrast
Comparison: Previous studies including the examination of
[DATE]

CLINICAL DATA: Shortness of breath

EXAM:
PORTABLE CHEST 1 VIEW

[chest]
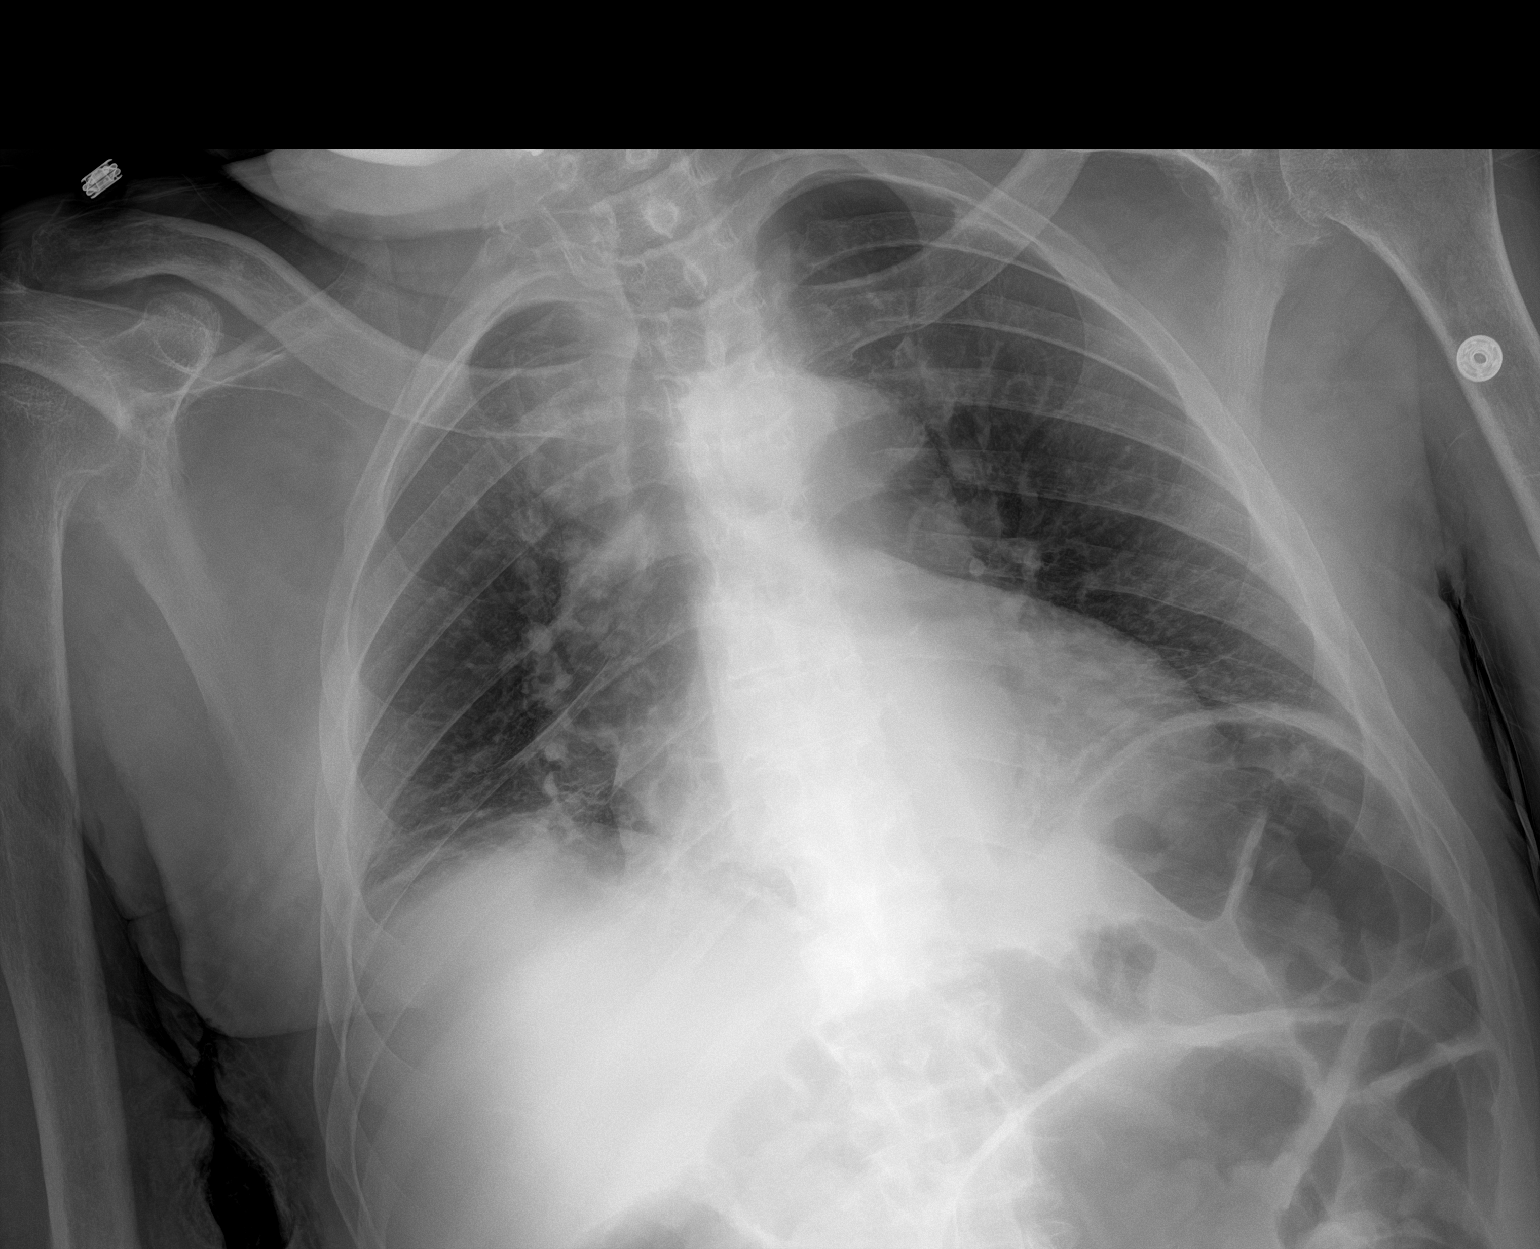

[1 of 1 positions shown; findings below may reference images not displayed]

FINDINGS: Transverse diameter of heart is increased. There is poor
inspiration. Central pulmonary vessels are less prominent. There is
improvement in aeration of lower lung fields. There are small linear
densities in the lower lung fields. There is no new focal pulmonary
consolidation. Right lateral CP angle is indistinct. There is no
pneumothorax.
IMPRESSION: There is interval decrease in pulmonary vascular congestion. Linear
densities seen in the lower lung fields suggesting crowding of
bronchovascular structures or subsegmental atelectasis.

## 2021-01-19 MED ORDER — SODIUM CHLORIDE 0.9 % IV SOLN
2.0000 g | Freq: Two times a day (BID) | INTRAVENOUS | Status: DC
  Administered 2021-01-19 – 2021-01-20 (×4): 2 g via INTRAVENOUS
  Filled 2021-01-19 (×5): qty 2

## 2021-01-19 MED ORDER — LACTATED RINGERS IV BOLUS: 500.0000 mL | Freq: Once | INTRAVENOUS | Status: DC

## 2021-01-19 MED ORDER — LACTATED RINGERS IV BOLUS
1000.0000 mL | Freq: Once | INTRAVENOUS | Status: AC
  Administered 2021-01-19: 1000 mL via INTRAVENOUS

## 2021-01-19 MED ORDER — LACTATED RINGERS IV SOLN: INTRAVENOUS | Status: AC
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
## 2021-01-19 NOTE — Progress Notes (Signed)
PROGRESS NOTE    [name] [name]  UEA:[id number]  DOB: 02-12-1945  DOA: 01/12/2021 PCP: Beckie Salts, MD Outpatient Specialists:   Hospital course:  76 year old man with paraplegia/functional quadriplegia, DM, PVD S/PE right elbow amputation was admitted yesterday for bilateral lower extremity cellulitis and possible right facial droop.   Subjective: Patient in bed, appears comfortable, denies any headache, no fever, no chest pain or pressure, no shortness of breath , no abdominal pain. No new focal weakness.   Objective: Vitals:   01/19/21 0100 01/19/21 0420 01/19/21 0802 01/19/21 0900  BP: (!) 91/56 (!) 85/54 (!) 83/57 112/69  Pulse: 86 87 87   Resp:  18 16   Temp:  98.7 F (37.1 C) 98.9 F (37.2 C)   TempSrc:  Oral Oral   SpO2: 91% 90% 92%   Weight:      Height:        Intake/Output Summary (Last 24 hours) at 01/19/2021 1128 Last data filed at 01/19/2021 1015 Gross per 24 hour  Intake 3480.32 ml  Output 500 ml  Net 2980.32 ml   Filed Weights   01/12/21 1953  Weight: 87.6 kg     Exam:  Awake Alert, No new F.N deficits, Normal affect [place].AT,PERRAL Supple Neck, No JVD,   Symmetrical Chest wall movement, Good air movement bilaterally, CTAB RRR,No Gallops, Rubs or new Murmurs,  +ve B.Sounds, Abd Soft, No tenderness,    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   Assessment & Plan:   Altered mental status R/oh CVA R/O metabolic encephalopathy - he was seen by neurology and this was thought to be due to metabolic encephalopathy.  Currently on aspirin and statin for secondary prevention and close to baseline.  Aspiration with hypotension and early sepsis on 01/18/2021.  Patient has no teeth and was trying to chew regular diet, chest x-ray is inconclusive however he developed sudden leukocytosis  and procalcitonin is sky high, is responding to Unasyn, continue IV fluid bolus and maintenance to augment blood pressure, seen by speech currently on soft diet, appears nontoxic and mentation is good continue to monitor with IV fluids and antibiotics follow cultures.  Hypotension - see above.  Random cortisol and TSH stable, added midodrine to IV fluids  Bilateral lower extremity erythema secondary to intraepidermal acantholysis -  Patient has multiple wounds/ulcers on his markedly edematous lower extremities.  Will briefly treated with antibiotics but after previous MD discussed with the daughter it seems like these like changes are chronic and antibiotics were stopped  Hypomagnesemia.  Replaced.    Hyperkalemia. Improved after after Lokelma 3 doses.      DM2 Blood sugars under reasonable control on low-dose SSI AC at bedtime  Lab Results  Component Value Date   HGBA1C 7.6 (H) 01/13/2021   CBG (last 3)  Recent Labs    01/18/21 1707 01/18/21 2108 01/19/21 0802  GLUCAP 150* 162* 112*     Disposition Await SNF bed  COVID-vaccine #1 was given - 01/17/2021   DVT prophylaxis: Lovenox Code Status: Full Family Communication: None Disposition Plan:   Anticipated Discharge Location: SNF  Barriers to Discharge: SNF  Is patient medically stable for Discharge: Yes   Scheduled Meds:  aspirin EC  81 mg Oral Daily   enoxaparin (LOVENOX) injection  40 mg Subcutaneous Q24H   insulin aspart  0-9 Units Subcutaneous TID WC &  HS   LORazepam  1 mg Intravenous Once   midodrine  10 mg Oral TID WC   pravastatin  20 mg Oral Daily   Continuous Infusions:  ampicillin-sulbactam (UNASYN) IV 3 g (01/19/21 1014)   lactated ringers Stopped (01/19/21 1015)    Data Reviewed: Recent Labs  Lab 01/12/21 1948 01/12/21 1953 01/13/21 0850 01/14/21 0116 01/14/21 0834 01/18/21 0731 01/19/21 0152  WBC 11.0*  --  8.4 8.0  --  21.8* 15.7*  HGB 10.9*   < > 9.2* 8.7* 8.6* 8.8* 8.0*  HCT 34.4*   < >  29.8* 27.1* 26.4* 27.9* 24.8*  PLT 208  --  193 201  --  260 270  MCV 101.5*  --  100.3* 97.8  --  101.1* 97.6  MCH 32.2  --  31.0 31.4  --  31.9 31.5  MCHC 31.7  --  30.9 32.1  --  31.5 32.3  RDW 13.9  --  13.8 13.5  --  13.9 13.8  LYMPHSABS 2.2  --   --   --   --  0.4* 1.2  MONOABS 1.1*  --   --   --   --  0.4 0.9  EOSABS 0.2  --   --   --   --  0.0 0.3  BASOSABS 0.0  --   --   --   --  0.0 0.1   < > = values in this interval not displayed.    Recent Labs  Lab 01/12/21 1948 01/12/21 1953 01/13/21 0850 01/14/21 0116 01/16/21 0743 01/18/21 0726 01/18/21 0731 01/18/21 1000 01/18/21 1509 01/19/21 0152  NA 132* 134* 135 135  --  137  --   --   --  135  K 4.9 4.9 4.4 4.7  --  5.4*  --   --   --  4.9  CL 108 108 111 111  --  115*  --   --   --  113*  CO2 16*  --  17* 15*  --  15*  --   --   --  15*  GLUCOSE 176* 174* 102* 98  --  115*  --   --   --  122*  BUN 37* 35* 35* 37*  --  28*  --   --   --  33*  CREATININE 1.32* 1.10 1.13 1.18  --  0.87  --   --   --  1.12  CALCIUM 8.4*  --  8.3* 8.2*  --  8.2*  --   --   --  8.1*  AST 18  --   --   --   --   --   --   --   --  28  ALT 17  --   --   --   --   --   --   --   --  26  ALKPHOS 88  --   --   --   --   --   --   --   --  83  BILITOT 0.7  --   --   --   --   --   --   --   --  0.4  ALBUMIN 2.6*  --   --   --   --   --   --   --   --  2.0*  MG  --   --   --  1.3* 1.8 1.4*  --   --   --  1.8  PROCALCITON  --   --   --   --   --   --   --   --  71.60 84.66  INR 1.2  --   --   --   --   --   --   --   --   --   TSH  --   --   --   --   --   --   --  1.570  --   --   HGBA1C  --   --  7.6*  --   --   --   --   --   --   --   BNP  --   --   --   --   --   --  205.9*  --   --  250.5*        Await SNF bed. Studies: DG Chest Port 1 View  Result Date: 01/19/2021 CLINICAL DATA:  Shortness of breath EXAM: PORTABLE CHEST 1 VIEW COMPARISON:  Previous studies including the examination of 01/18/2021 FINDINGS: Transverse diameter of  heart is increased. There is poor inspiration. Central pulmonary vessels are less prominent. There is improvement in aeration of lower lung fields. There are small linear densities in the lower lung fields. There is no new focal pulmonary consolidation. Right lateral CP angle is indistinct. There is no pneumothorax. IMPRESSION: There is interval decrease in pulmonary vascular congestion. Linear densities seen in the lower lung fields suggesting crowding of bronchovascular structures or subsegmental atelectasis. Electronically Signed   By: Elmer Picker M.D.   On: 01/19/2021 08:22   DG Chest Port 1 View  Result Date: 01/18/2021 CLINICAL DATA:  Eval for SOB, pt seems to have a congested cough - hx of diabetes, htn - pt unable to lay completely flat and head wont go up out of chest so best obtainable image EXAM: PORTABLE CHEST - 1 VIEW COMPARISON:  01/12/2021 FINDINGS: Relatively low lung volumes as before. Worsening ill-defined airspace and interstitial opacities in both lung bases. Suspect mild central pulmonary vascular congestion. Heart size and mediastinal contours are within normal limits. Persistent mild blunting of the right lateral costophrenic angle. Visualized bones unremarkable. IMPRESSION: Worsening  perihilar and bibasilar edema or infiltrates. Electronically Signed   By: Lucrezia Europe M.D.   On: 01/18/2021 07:57    Principal Problem:   TIA (transient ischemic attack) Active Problems:   Essential hypertension   Diabetes mellitus without complication (HCC)   Wounds, multiple   Paraplegia (HCC)   Bilateral cellulitis of lower leg  Signature  Lala Lund M.D on 01/19/2021 at 11:28 AM   -  To page go to [email or web address]    LOS: 6 days

## 2021-01-19 NOTE — TOC Progression Note (Signed)
Transition of Care [facility]) - Progression Note    Patient Details  Name: [name] [name] MRN: [id number] Date of Birth: 1945-01-27  Transition of [facility]) CM/SW [place], LCSW Phone Number: 01/19/2021, 4:55 PM  Clinical Narrative:    [facility] reported [facility] can accept patient; no other ltc bed offers at this time. [facility] will confirm patient's Medicaid.    Expected Discharge Plan: Skilled Nursing Facility Barriers to Discharge: SNF Pending bed offer  Expected Discharge Plan and Services Expected Discharge Plan: [place] In-house Referral: Clinical Social Work   Post Acute Care Choice: [place] Living arrangements for the past 2 months: Apartment                                       Social Determinants of Health (SDOH) Interventions    Readmission Risk Interventions No flowsheet data found.

## 2021-01-19 NOTE — Plan of Care (Signed)
  Problem: Pain Management: Goal: Pain level will decrease with appropriate interventions Outcome: Progressing   Problem: Skin Integrity: Goal: Demonstration of wound healing without infection will improve Outcome: Progressing   

## 2021-01-19 NOTE — Progress Notes (Signed)
PHARMACY - PHYSICIAN COMMUNICATION CRITICAL VALUE ALERT - BLOOD CULTURE IDENTIFICATION (BCID)  [name] [name] is an 76 y.o. adult who presented to [facility] on 01/12/2021, had an episode of hypotension on 01/18/21.  Unasyn was started and blood cultures were drawn.  Assessment:  Serratia marcescens identified in one set of blood cultures.    Name of physician (or Provider) Contacted: Dr. Candiss Norse  Current antibiotics: Unasyn  Changes to prescribed antibiotics recommended: Change unasyn to cefepime Recommendations accepted by provider  Results for orders placed or performed during the hospital encounter of 01/12/21  Blood Culture ID Panel (Reflexed) (Collected: 01/18/2021  3:14 PM)  Result Value Ref Range   Enterococcus faecalis NOT DETECTED NOT DETECTED   Enterococcus Faecium NOT DETECTED NOT DETECTED   Listeria monocytogenes NOT DETECTED NOT DETECTED   Staphylococcus species NOT DETECTED NOT DETECTED   Staphylococcus aureus (BCID) NOT DETECTED NOT DETECTED   Staphylococcus epidermidis NOT DETECTED NOT DETECTED   Staphylococcus lugdunensis NOT DETECTED NOT DETECTED   Streptococcus species NOT DETECTED NOT DETECTED   Streptococcus agalactiae NOT DETECTED NOT DETECTED   Streptococcus pneumoniae NOT DETECTED NOT DETECTED   Streptococcus pyogenes NOT DETECTED NOT DETECTED   A.calcoaceticus-baumannii NOT DETECTED NOT DETECTED   Bacteroides fragilis NOT DETECTED NOT DETECTED   Enterobacterales DETECTED (A) NOT DETECTED   Enterobacter cloacae complex NOT DETECTED NOT DETECTED   Escherichia coli NOT DETECTED NOT DETECTED   Klebsiella aerogenes NOT DETECTED NOT DETECTED   Klebsiella oxytoca NOT DETECTED NOT DETECTED   Klebsiella pneumoniae NOT DETECTED NOT DETECTED   Proteus species NOT DETECTED NOT DETECTED   Salmonella species NOT DETECTED NOT DETECTED   Serratia marcescens DETECTED (A) NOT DETECTED   Haemophilus influenzae NOT DETECTED NOT DETECTED   Neisseria meningitidis NOT DETECTED  NOT DETECTED   Pseudomonas aeruginosa NOT DETECTED NOT DETECTED   Stenotrophomonas maltophilia NOT DETECTED NOT DETECTED   Candida albicans NOT DETECTED NOT DETECTED   Candida auris NOT DETECTED NOT DETECTED   Candida glabrata NOT DETECTED NOT DETECTED   Candida krusei NOT DETECTED NOT DETECTED   Candida parapsilosis NOT DETECTED NOT DETECTED   Candida tropicalis NOT DETECTED NOT DETECTED   Cryptococcus neoformans/gattii NOT DETECTED NOT DETECTED   CTX-M ESBL NOT DETECTED NOT DETECTED   Carbapenem resistance IMP NOT DETECTED NOT DETECTED   Carbapenem resistance KPC NOT DETECTED NOT DETECTED   Carbapenem resistance NDM NOT DETECTED NOT DETECTED   Carbapenem resist OXA 48 LIKE NOT DETECTED NOT DETECTED   Carbapenem resistance VIM NOT DETECTED NOT DETECTED    [name] 01/19/2021  2:12 PM

## 2021-01-20 ENCOUNTER — Inpatient Hospital Stay (HOSPITAL_COMMUNITY): Payer: Medicare Other

## 2021-01-20 DIAGNOSIS — G459 Transient cerebral ischemic attack, unspecified: Secondary | ICD-10-CM | POA: Diagnosis not present

## 2021-01-20 DIAGNOSIS — R7881 Bacteremia: Secondary | ICD-10-CM

## 2021-01-20 DIAGNOSIS — L03116 Cellulitis of left lower limb: Secondary | ICD-10-CM | POA: Diagnosis not present

## 2021-01-20 DIAGNOSIS — G8220 Paraplegia, unspecified: Secondary | ICD-10-CM | POA: Diagnosis not present

## 2021-01-20 DIAGNOSIS — E119 Type 2 diabetes mellitus without complications: Secondary | ICD-10-CM | POA: Diagnosis not present

## 2021-01-20 LAB — GLUCOSE, CAPILLARY
Glucose-Capillary: 159 mg/dL — ABNORMAL HIGH (ref 70–99)
Glucose-Capillary: 205 mg/dL — ABNORMAL HIGH (ref 70–99)
Glucose-Capillary: 189 mg/dL — ABNORMAL HIGH (ref 70–99)
Glucose-Capillary: 185 mg/dL — ABNORMAL HIGH (ref 70–99)

## 2021-01-20 LAB — OCCULT BLOOD X 1 CARD TO LAB, STOOL: Fecal Occult Bld: NEGATIVE

## 2021-01-20 LAB — URINALYSIS, ROUTINE W REFLEX MICROSCOPIC
Bilirubin Urine: NEGATIVE
Glucose, UA: NEGATIVE mg/dL
Hgb urine dipstick: NEGATIVE
Ketones, ur: NEGATIVE mg/dL
Leukocytes,Ua: NEGATIVE
Nitrite: NEGATIVE
Protein, ur: NEGATIVE mg/dL
Specific Gravity, Urine: 1.01 (ref 1.005–1.030)
pH: 5.5 (ref 5.0–8.0)

## 2021-01-20 MED ORDER — LACTATED RINGERS IV SOLN: INTRAVENOUS | Status: AC
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
## 2021-01-20 NOTE — Progress Notes (Signed)
IVT consulted to place midline.  Pt has a PIV that flushes well and was placed 1/5 via ultrasound guidance.  MD notified midline is unreliable for blood draw.  MD acknowledged.

## 2021-01-20 NOTE — Progress Notes (Signed)
SLP Cancellation Note  Patient Details Name: [name] [name] MRN: [id number] DOB: Feb 01, 1945   Cancelled treatment:       Reason Eval/Treat Not Completed: Patient declined, no reason specified (Pt brought to radiology suite and repositioning was difficult. Multiple attempts were made to reposition pt to ensure adequate visualization, but pt expressed that attempts were too painful and he emphatically requested to be returned to his room.)  Dulcemaria Bula I. Hardin Negus, [place], [place] Office number [phone] Pager [place] 01/20/2021, 1:54 PM

## 2021-01-20 NOTE — Progress Notes (Signed)
PROGRESS NOTE    [name] [name]  WFU:[id number]  DOB: 13-Oct-1945  DOA: 01/12/2021 PCP: Beckie Salts, MD Outpatient Specialists:   Hospital course:  76 year old man with paraplegia/functional quadriplegia, DM, PVD S/PE right elbow amputation was admitted yesterday for bilateral lower extremity cellulitis and possible right facial droop.   Subjective: Patient in bed, appears comfortable, denies any headache, no fever, no chest pain or pressure, no shortness of breath , no abdominal pain. No new focal weakness.    Objective: Vitals:   01/20/21 0100 01/20/21 0200 01/20/21 0406 01/20/21 0729  BP: (!) 159/77 128/66 110/62 96/64  Pulse: (!) 104 99 99 82  Resp:  20 18 18   Temp:  100 F (37.8 C) 100 F (37.8 C) 98.7 F (37.1 C)  TempSrc:  Oral Oral Oral  SpO2: 92% 92% 93% 97%  Weight:      Height:        Intake/Output Summary (Last 24 hours) at 01/20/2021 1135 Last data filed at 01/20/2021 1000 Gross per 24 hour  Intake 1836 ml  Output 1000 ml  Net 836 ml   Filed Weights   01/12/21 1953  Weight: 87.6 kg     Exam:  Awake Alert, No new F.N deficits, Normal affect [place].AT,PERRAL Supple Neck, No JVD,   Symmetrical Chest wall movement, Good air movement bilaterally, CTAB RRR,No Gallops, Rubs or new Murmurs,  +ve B.Sounds, Abd Soft, No tenderness,    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   Assessment & Plan:   Altered mental status R/oh CVA R/O metabolic encephalopathy - he was seen by neurology and this was thought to be due to metabolic encephalopathy.  Currently on aspirin and statin for secondary prevention and close to baseline.  Aspiration with hypotension and early sepsis on 01/18/2021 with Serratia bacteremia.  Patient has no teeth and was trying to chew regular diet, chest x-ray is  inconclusive however he developed sudden leukocytosis along with hypotension on 01/18/2021, he also has multiple open sores chronic on both lower extremities.  Serratia bacteremia could be from aspiration versus skin source, UA also ordered and pending, currently on empiric cefepime and responding well.  Continue to monitor.  Sepsis pathophysiology has resolved.  Hypotension - see above.  Random cortisol and TSH stable, on midodrine has been aggressively hydrated with IV fluids gentle 500 cc more on 01/20/2021 and monitor.  Bilateral lower extremity erythema secondary to intraepidermal acantholysis -  Patient has multiple wounds/ulcers on his markedly edematous lower extremities.  Will briefly treated with antibiotics but after previous MD discussed with the daughter it seems like these like changes are chronic and antibiotics were stopped  Hypomagnesemia.  Replaced.    Hyperkalemia. Improved after after Lokelma 3 doses.     DM2 Blood sugars under reasonable control on low-dose SSI AC at bedtime  Lab Results  Component Value Date   HGBA1C 7.6 (H) 01/13/2021   CBG (last 3)  Recent Labs    01/19/21 2104 01/19/21 2335 01/20/21 0736  GLUCAP 167* 156* 159*     Disposition Await SNF bed  COVID-vaccine #1 was given - 01/17/2021   DVT prophylaxis: Lovenox Code Status: Full Family Communication: None Disposition Plan:   Anticipated Discharge Location: SNF  Barriers to Discharge: SNF  Is patient medically stable for Discharge: Yes   Scheduled Meds:  aspirin EC  81 mg Oral  Daily   enoxaparin (LOVENOX) injection  40 mg Subcutaneous Q24H   insulin aspart  0-9 Units Subcutaneous TID WC & HS   LORazepam  1 mg Intravenous Once   midodrine  10 mg Oral TID WC   pravastatin  20 mg Oral Daily   Continuous Infusions:  ceFEPime (MAXIPIME) IV 2 g (01/20/21 0854)   lactated ringers      Data Reviewed: Recent Labs  Lab 01/14/21 0116 01/14/21 0834 01/18/21 0731 01/19/21 0152  WBC 8.0  --   21.8* 15.7*  HGB 8.7* 8.6* 8.8* 8.0*  HCT 27.1* 26.4* 27.9* 24.8*  PLT 201  --  260 270  MCV 97.8  --  101.1* 97.6  MCH 31.4  --  31.9 31.5  MCHC 32.1  --  31.5 32.3  RDW 13.5  --  13.9 13.8  LYMPHSABS  --   --  0.4* 1.2  MONOABS  --   --  0.4 0.9  EOSABS  --   --  0.0 0.3  BASOSABS  --   --  0.0 0.1    Recent Labs  Lab 01/14/21 0116 01/16/21 0743 01/18/21 0726 01/18/21 0731 01/18/21 1000 01/18/21 1509 01/19/21 0152  NA 135  --  137  --   --   --  135  K 4.7  --  5.4*  --   --   --  4.9  CL 111  --  115*  --   --   --  113*  CO2 15*  --  15*  --   --   --  15*  GLUCOSE 98  --  115*  --   --   --  122*  BUN 37*  --  28*  --   --   --  33*  CREATININE 1.18  --  0.87  --   --   --  1.12  CALCIUM 8.2*  --  8.2*  --   --   --  8.1*  AST  --   --   --   --   --   --  28  ALT  --   --   --   --   --   --  26  ALKPHOS  --   --   --   --   --   --  83  BILITOT  --   --   --   --   --   --  0.4  ALBUMIN  --   --   --   --   --   --  2.0*  MG 1.3* 1.8 1.4*  --   --   --  1.8  PROCALCITON  --   --   --   --   --  71.60 84.66  TSH  --   --   --   --  1.570  --   --   BNP  --   --   --  205.9*  --   --  250.5*        Await SNF bed. Studies: DG Chest Port 1 View  Result Date: 01/19/2021 CLINICAL DATA:  Shortness of breath EXAM: PORTABLE CHEST 1 VIEW COMPARISON:  Previous studies including the examination of 01/18/2021 FINDINGS: Transverse diameter of heart is increased. There is poor inspiration. Central pulmonary vessels are less prominent. There is improvement in aeration of lower lung fields. There are small linear densities in the lower lung fields. There is no new  focal pulmonary consolidation. Right lateral CP angle is indistinct. There is no pneumothorax. IMPRESSION: There is interval decrease in pulmonary vascular congestion. Linear densities seen in the lower lung fields suggesting crowding of bronchovascular structures or subsegmental atelectasis. Electronically Signed    By: Elmer Picker M.D.   On: 01/19/2021 08:22    Principal Problem:   TIA (transient ischemic attack) Active Problems:   Essential hypertension   Diabetes mellitus without complication (HCC)   Wounds, multiple   Paraplegia (HCC)   Bilateral cellulitis of lower leg  Signature  Lala Lund M.D on 01/20/2021 at 11:35 AM   -  To page go to [email or web address]    LOS: 7 days

## 2021-01-20 NOTE — Plan of Care (Signed)
°  Problem: Safety: Goal: Ability to remain free from injury will improve Outcome: Progressing   Problem: Skin Integrity: Goal: Risk for impaired skin integrity will decrease Outcome: Progressing   Problem: Skin Integrity: Goal: Demonstration of wound healing without infection will improve Outcome: Progressing

## 2021-01-20 NOTE — TOC Progression Note (Signed)
Transition of Care [facility]) - Progression Note    Patient Details  Name: RODDY BELLAMY MRN: [id number] Date of Birth: December 22, 1945  Transition of Care [facility]) CM/SW [place], LCSW Phone Number: 01/20/2021, 4:41 PM  Clinical Narrative:    CSW updated patient's daughter that [facility] hopefully able to accept patient next week pending medical stability. She stated she is aware that patient is wanting to come home but she has spoken with him about sending him to SNF for at least 30 days. CSW will continue to follow.    Expected Discharge Plan: Skilled Nursing Facility Barriers to Discharge: SNF Pending bed offer  Expected Discharge Plan and Services Expected Discharge Plan: [place] In-house Referral: Clinical Social Work   Post Acute Care Choice: [place] Living arrangements for the past 2 months: Apartment                                       Social Determinants of Health (SDOH) Interventions    Readmission Risk Interventions No flowsheet data found.

## 2021-01-20 NOTE — Plan of Care (Signed)
°  Problem: Education: Goal: Knowledge of patient specific risk factors will improve (INDIVIDUALIZE FOR PATIENT) 01/20/2021 0529 by Maxie Barb, RN Outcome: Progressing 01/20/2021 0527 by Maxie Barb, RN Outcome: Progressing   Problem: Education: Goal: Knowledge of disease or condition will improve 01/20/2021 0529 by Maxie Barb, RN Outcome: Progressing 01/20/2021 0527 by Maxie Barb, RN Outcome: Progressing
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
## 2021-01-21 LAB — CBC WITH DIFFERENTIAL/PLATELET
Abs Immature Granulocytes: 0.09 10*3/uL — ABNORMAL HIGH (ref 0.00–0.07)
Basophils Absolute: 0 10*3/uL (ref 0.0–0.1)
Basophils Relative: 0 %
Eosinophils Absolute: 0.3 10*3/uL (ref 0.0–0.5)
Eosinophils Relative: 3 %
HCT: 29 % — ABNORMAL LOW (ref 39.0–52.0)
Hemoglobin: 8.9 g/dL — ABNORMAL LOW (ref 13.0–17.0)
Immature Granulocytes: 1 %
Lymphocytes Relative: 13 %
Lymphs Abs: 1.5 10*3/uL (ref 0.7–4.0)
MCH: 31.3 pg (ref 26.0–34.0)
MCHC: 30.7 g/dL (ref 30.0–36.0)
MCV: 102.1 fL — ABNORMAL HIGH (ref 80.0–100.0)
Monocytes Absolute: 1.1 10*3/uL — ABNORMAL HIGH (ref 0.1–1.0)
Monocytes Relative: 10 %
Neutro Abs: 8 10*3/uL — ABNORMAL HIGH (ref 1.7–7.7)
Neutrophils Relative %: 73 %
Platelets: 263 10*3/uL (ref 150–400)
RBC: 2.84 MIL/uL — ABNORMAL LOW (ref 4.22–5.81)
RDW: 14 % (ref 11.5–15.5)
WBC: 10.9 10*3/uL — ABNORMAL HIGH (ref 4.0–10.5)
nRBC: 0 % (ref 0.0–0.2)

## 2021-01-21 LAB — URINE CULTURE: Culture: NO GROWTH

## 2021-01-21 LAB — CULTURE, BLOOD (ROUTINE X 2): Special Requests: ADEQUATE

## 2021-01-21 LAB — COMPREHENSIVE METABOLIC PANEL
ALT: 15 U/L (ref 0–44)
AST: 15 U/L (ref 15–41)
Albumin: 1.9 g/dL — ABNORMAL LOW (ref 3.5–5.0)
Alkaline Phosphatase: 91 U/L (ref 38–126)
Anion gap: 8 (ref 5–15)
BUN: 29 mg/dL — ABNORMAL HIGH (ref 8–23)
CO2: 15 mmol/L — ABNORMAL LOW (ref 22–32)
Calcium: 8.3 mg/dL — ABNORMAL LOW (ref 8.9–10.3)
Chloride: 111 mmol/L (ref 98–111)
Creatinine, Ser: 1.04 mg/dL (ref 0.61–1.24)
GFR, Estimated: >60 mL/min (ref >60)
Glucose, Bld: 159 mg/dL — ABNORMAL HIGH (ref 70–99)
Potassium: 5 mmol/L (ref 3.5–5.1)
Sodium: 134 mmol/L — ABNORMAL LOW (ref 135–145)
Total Bilirubin: 0.3 mg/dL (ref 0.3–1.2)
Total Protein: 6 g/dL — ABNORMAL LOW (ref 6.5–8.1)

## 2021-01-21 LAB — GLUCOSE, CAPILLARY
Glucose-Capillary: 160 mg/dL — ABNORMAL HIGH (ref 70–99)
Glucose-Capillary: 231 mg/dL — ABNORMAL HIGH (ref 70–99)
Glucose-Capillary: 221 mg/dL — ABNORMAL HIGH (ref 70–99)
Glucose-Capillary: 193 mg/dL — ABNORMAL HIGH (ref 70–99)

## 2021-01-21 LAB — MAGNESIUM: Magnesium: 1.8 mg/dL (ref 1.7–2.4)

## 2021-01-21 LAB — PROCALCITONIN: Procalcitonin: 35.65 ng/mL

## 2021-01-21 MED ORDER — SODIUM CHLORIDE 0.9 % IV SOLN
2.0000 g | INTRAVENOUS | Status: DC
  Administered 2021-01-21: 2 g via INTRAVENOUS
  Filled 2021-01-21: qty 20

## 2021-01-21 MED ORDER — MIDODRINE HCL 5 MG PO TABS
5.0000 mg | ORAL_TABLET | Freq: Two times a day (BID) | ORAL | Status: DC
  Administered 2021-01-21 (×2): 5 mg via ORAL
  Filled 2021-01-21 (×2): qty 1

## 2021-01-21 MED ORDER — SULFAMETHOXAZOLE-TRIMETHOPRIM 800-160 MG PO TABS
1.0000 | ORAL_TABLET | Freq: Two times a day (BID) | ORAL | Status: DC
  Administered 2021-01-22 – 2021-01-23 (×3): 1 via ORAL
  Filled 2021-01-21 (×4): qty 1

## 2021-01-21 NOTE — Progress Notes (Signed)
PROGRESS NOTE    THAXTON [name]  DPO:[id number]  DOB: 11-Feb-1945  DOA: 01/12/2021 PCP: Beckie Salts, MD Outpatient Specialists:   Hospital course:  76 year old man with paraplegia/functional quadriplegia, DM, PVD S/PE right elbow amputation was admitted yesterday for bilateral lower extremity cellulitis and possible right facial droop.   Subjective: Patient in bed, appears comfortable, denies any headache, no fever, no chest pain or pressure, no shortness of breath , no abdominal pain. No new focal weakness.    Objective: Vitals:   01/20/21 1954 01/21/21 0000 01/21/21 0400 01/21/21 0742  BP: (!) 167/85 126/71 (!) 163/86 124/75  Pulse: 76 67 85 78  Resp: 18 16 18 19   Temp: 98.5 F (36.9 C) 98.3 F (36.8 C) 97.9 F (36.6 C) 97.6 F (36.4 C)  TempSrc: Oral Oral Oral Oral  SpO2: 95% 96% 97% 99%  Weight:      Height:        Intake/Output Summary (Last 24 hours) at 01/21/2021 1125 Last data filed at 01/21/2021 4315 Gross per 24 hour  Intake 1557.89 ml  Output 2750 ml  Net -1192.11 ml   Filed Weights   01/12/21 1953  Weight: 87.6 kg     Exam:  Awake Alert, No new F.N deficits, Normal affect [place].AT,PERRAL Supple Neck, No JVD,   Symmetrical Chest wall movement, Good air movement bilaterally, CTAB RRR,No Gallops, Rubs or new Murmurs,  +ve B.Sounds, Abd Soft, No tenderness,    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   Assessment & Plan:   Altered mental status R/oh CVA R/O metabolic encephalopathy - he was seen by neurology and this was thought to be due to metabolic encephalopathy.  Currently on aspirin and statin for secondary prevention and close to baseline.  Aspiration with hypotension and early sepsis on 01/18/2021 with Serratia bacteremia.  Patient has no teeth and was trying to chew  regular diet, chest x-ray is inconclusive however he developed sudden leukocytosis along with hypotension on 01/18/2021, he also has multiple open sores chronic on both lower extremities.  Serratia bacteremia could be from aspiration versus skin source, UA stable.  Continue to monitor.  Sepsis pathophysiology has resolved.  Case discussed with ID physician Dr. Drucilla Schmidt on 01/20/2021.  Hypotension - see above.  Random cortisol and TSH stable, on midodrine has been aggressively hydrated with IV fluids gentle 500 cc more on 01/20/2021 and monitor.  Bilateral lower extremity erythema secondary to intraepidermal acantholysis -  Patient has multiple wounds/ulcers on his markedly edematous lower extremities.  Will briefly treated with antibiotics but after previous MD discussed with the daughter it seems like these like changes are chronic and antibiotics were stopped  Hypomagnesemia.  Replaced.    Hyperkalemia. Improved after after Lokelma 3 doses.     DM2 Blood sugars under reasonable control on low-dose SSI AC at bedtime  Lab Results  Component Value Date   HGBA1C 7.6 (H) 01/13/2021   CBG (last 3)  Recent Labs    01/20/21 1656 01/20/21 2004 01/21/21 0744  GLUCAP 189* 185* 160*     Disposition Await SNF bed  COVID-vaccine #1 was given - 01/17/2021   DVT prophylaxis: Lovenox Code Status: Full Family Communication: None Disposition Plan:   Anticipated Discharge Location: SNF  Barriers to Discharge: SNF  Is patient medically stable for Discharge: Yes   Scheduled Meds:  aspirin EC  81 mg Oral Daily   enoxaparin (LOVENOX) injection  40 mg Subcutaneous Q24H   insulin aspart  0-9 Units Subcutaneous TID WC & HS   LORazepam  1 mg Intravenous Once   midodrine  5 mg Oral BID WC   pravastatin  20 mg Oral Daily   Continuous Infusions:  cefTRIAXone (ROCEPHIN)  IV      Data Reviewed: Recent Labs  Lab 01/18/21 0731 01/19/21 0152 01/21/21 0137  WBC 21.8* 15.7* 10.9*  HGB 8.8* 8.0* 8.9*   HCT 27.9* 24.8* 29.0*  PLT 260 270 263  MCV 101.1* 97.6 102.1*  MCH 31.9 31.5 31.3  MCHC 31.5 32.3 30.7  RDW 13.9 13.8 14.0  LYMPHSABS 0.4* 1.2 1.5  MONOABS 0.4 0.9 1.1*  EOSABS 0.0 0.3 0.3  BASOSABS 0.0 0.1 0.0    Recent Labs  Lab 01/16/21 0743 01/18/21 0726 01/18/21 0731 01/18/21 1000 01/18/21 1509 01/19/21 0152 01/21/21 0137  NA  --  137  --   --   --  135 134*  K  --  5.4*  --   --   --  4.9 5.0  CL  --  115*  --   --   --  113* 111  CO2  --  15*  --   --   --  15* 15*  GLUCOSE  --  115*  --   --   --  122* 159*  BUN  --  28*  --   --   --  33* 29*  CREATININE  --  0.87  --   --   --  1.12 1.04  CALCIUM  --  8.2*  --   --   --  8.1* 8.3*  AST  --   --   --   --   --  28 15  ALT  --   --   --   --   --  26 15  ALKPHOS  --   --   --   --   --  83 91  BILITOT  --   --   --   --   --  0.4 0.3  ALBUMIN  --   --   --   --   --  2.0* 1.9*  MG 1.8 1.4*  --   --   --  1.8 1.8  PROCALCITON  --   --   --   --  71.60 84.66  --   TSH  --   --   --  1.570  --   --   --   BNP  --   --  205.9*  --   --  250.5*  --         Await SNF bed. Studies: DG Elbow 2 Views Right  Result Date: 01/20/2021 CLINICAL DATA:  Right elbow wound drainage EXAM: RIGHT ELBOW - 2 VIEW COMPARISON:  09/05/2020 FINDINGS: Frontal, oblique, lateral views of the right elbow are obtained. Evaluation limited by patient cooperation and positioning. There is a large dorsal soft tissue defect overlying the olecranon. Extensive soft tissue swelling throughout the elbow and visualized portion of the right upper extremity. Marked periarticular osteopenia. I do not see any discrete cortical destruction or periosteal reaction. No definite joint effusion. IMPRESSION: 1. Diffuse soft tissue edema, with large soft tissue defect overlying the olecranon consistent with ulceration. Findings compatible with diffuse cellulitis. 2. Periarticular osteopenia, without definite bony destruction to suggest osteomyelitis.  Electronically Signed   By:  Randa Ngo M.D.   On: 01/20/2021 16:41    Principal Problem:   TIA (transient ischemic attack) Active Problems:   Essential hypertension   Diabetes mellitus without complication (HCC)   Wounds, multiple   Paraplegia (HCC)   Bilateral cellulitis of lower leg   Gram-negative bacteremia  Signature  Lala Lund M.D on 01/21/2021 at 11:25 AM   -  To page go to [email or web address]    LOS: 8 days

## 2021-01-21 NOTE — Progress Notes (Signed)
° ° ° ° °  INFECTIOUS DISEASE ATTENDING ADDENDUM:   Date: 01/21/2021  Patient name: [name] [name]  Medical record number: [id number]  Date of birth: May 17, 1945   Sensitivities on Serratia have come back and I changed him over to ceftriaxone.  Would switch him to oral therapy with Bactrim twice daily tomorrow to complete 7 days of total therapy.  I would treat this as not being a deep infection with a gram-negative rod and frankly his gram-negative positive blood culture which appear to only 1 of 2 cultures could be a contaminant and the fevers could have been due to COVID-19 vaccination.  I will sign off for now please call with further questions.   Alcide Evener 01/21/2021, 12:59 PM

## 2021-01-22 LAB — CBC WITH DIFFERENTIAL/PLATELET
Abs Immature Granulocytes: 0.15 K/uL — ABNORMAL HIGH (ref 0.00–0.07)
Basophils Absolute: 0.1 K/uL (ref 0.0–0.1)
Basophils Relative: 1 %
Eosinophils Absolute: 0.4 K/uL (ref 0.0–0.5)
Eosinophils Relative: 3 %
HCT: 27 % — ABNORMAL LOW (ref 39.0–52.0)
Hemoglobin: 8.7 g/dL — ABNORMAL LOW (ref 13.0–17.0)
Immature Granulocytes: 1 %
Lymphocytes Relative: 16 %
Lymphs Abs: 1.7 K/uL (ref 0.7–4.0)
MCH: 31.3 pg (ref 26.0–34.0)
MCHC: 32.2 g/dL (ref 30.0–36.0)
MCV: 97.1 fL (ref 80.0–100.0)
Monocytes Absolute: 1 K/uL (ref 0.1–1.0)
Monocytes Relative: 9 %
Neutro Abs: 7.4 K/uL (ref 1.7–7.7)
Neutrophils Relative %: 70 %
Platelets: 329 K/uL (ref 150–400)
RBC: 2.78 MIL/uL — ABNORMAL LOW (ref 4.22–5.81)
RDW: 13.9 % (ref 11.5–15.5)
WBC: 10.6 K/uL — ABNORMAL HIGH (ref 4.0–10.5)
nRBC: 0.3 % — ABNORMAL HIGH (ref 0.0–0.2)

## 2021-01-22 LAB — COMPREHENSIVE METABOLIC PANEL WITH GFR
ALT: 34 U/L (ref 0–44)
AST: 66 U/L — ABNORMAL HIGH (ref 15–41)
Albumin: 1.9 g/dL — ABNORMAL LOW (ref 3.5–5.0)
Alkaline Phosphatase: 111 U/L (ref 38–126)
Anion gap: 7 (ref 5–15)
BUN: 29 mg/dL — ABNORMAL HIGH (ref 8–23)
CO2: 16 mmol/L — ABNORMAL LOW (ref 22–32)
Calcium: 8.3 mg/dL — ABNORMAL LOW (ref 8.9–10.3)
Chloride: 111 mmol/L (ref 98–111)
Creatinine, Ser: 1.06 mg/dL (ref 0.61–1.24)
GFR, Estimated: >60 mL/min
Glucose, Bld: 151 mg/dL — ABNORMAL HIGH (ref 70–99)
Potassium: 4.9 mmol/L (ref 3.5–5.1)
Sodium: 134 mmol/L — ABNORMAL LOW (ref 135–145)
Total Bilirubin: 0.5 mg/dL (ref 0.3–1.2)
Total Protein: 6 g/dL — ABNORMAL LOW (ref 6.5–8.1)

## 2021-01-22 LAB — PROCALCITONIN: Procalcitonin: 34.9 ng/mL

## 2021-01-22 LAB — BRAIN NATRIURETIC PEPTIDE: B Natriuretic Peptide: 137.3 pg/mL — ABNORMAL HIGH (ref 0.0–100.0)

## 2021-01-22 LAB — GLUCOSE, CAPILLARY
Glucose-Capillary: 143 mg/dL — ABNORMAL HIGH (ref 70–99)
Glucose-Capillary: 197 mg/dL — ABNORMAL HIGH (ref 70–99)
Glucose-Capillary: 196 mg/dL — ABNORMAL HIGH (ref 70–99)
Glucose-Capillary: 161 mg/dL — ABNORMAL HIGH (ref 70–99)

## 2021-01-22 LAB — MAGNESIUM: Magnesium: 1.8 mg/dL (ref 1.7–2.4)

## 2021-01-22 MED ORDER — MIDODRINE HCL 5 MG PO TABS
5.0000 mg | ORAL_TABLET | Freq: Once | ORAL | Status: AC
  Administered 2021-01-22: 5 mg via ORAL
  Filled 2021-01-22: qty 1

## 2021-01-22 NOTE — Progress Notes (Signed)
PROGRESS NOTE    [name] [name]  QHU:[id number]  DOB: Sep 01, 1945  DOA: 01/12/2021 PCP: Beckie Salts, MD Outpatient Specialists:   Hospital course:  76 year old man with paraplegia/functional quadriplegia, DM, PVD S/PE right elbow amputation was admitted yesterday for bilateral lower extremity cellulitis and possible right facial droop.   Subjective: Patient in bed, appears comfortable, denies any headache, no fever, no chest pain or pressure, no shortness of breath , no abdominal pain. No new focal weakness.     Objective: Vitals:   01/22/21 0000 01/22/21 0319 01/22/21 0400 01/22/21 0739  BP: 119/69 106/62 127/70 91/64  Pulse: 78 88 91 88  Resp: 18 18  16   Temp: 97.9 F (36.6 C) 98.9 F (37.2 C)  98.9 F (37.2 C)  TempSrc: Oral Oral  Oral  SpO2: 96% 92% 94% 97%  Weight:      Height:        Intake/Output Summary (Last 24 hours) at 01/22/2021 1204 Last data filed at 01/22/2021 0925 Gross per 24 hour  Intake 980 ml  Output 2700 ml  Net -1720 ml   Filed Weights   01/12/21 1953  Weight: 87.6 kg     Exam:  Awake Alert, No new F.N deficits, Normal affect [place].AT,PERRAL Supple Neck, No JVD,   Symmetrical Chest wall movement, Good air movement bilaterally, CTAB RRR,No Gallops, Rubs or new Murmurs,  +ve B.Sounds, Abd Soft, No tenderness,   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   Assessment & Plan:   Altered mental status R/oh CVA R/O metabolic encephalopathy - he was seen by neurology and this was thought to be due to metabolic encephalopathy.  Currently on aspirin and statin for secondary prevention and close to baseline.  Aspiration with hypotension and early sepsis on 01/18/2021 with Serratia bacteremia.  Patient has no teeth and was trying to chew regular diet, chest x-ray is inconclusive  however he developed sudden leukocytosis along with hypotension on 01/18/2021, he also has multiple open sores chronic on both lower extremities.  Serratia bacteremia could be from aspiration versus skin source, UA stable.  Continue to monitor.  Sepsis pathophysiology has resolved.  Case discussed with ID physician Dr. Drucilla Schmidt on 01/20/2021.  Initially on IV cephalosporin now on Bactrim per ID.  Sepsis pathophysiology has resolved.  Hypotension - see above.  Random cortisol and TSH stable, on midodrine has been aggressively hydrated with IV fluids.  Bilateral lower extremity erythema secondary to intraepidermal acantholysis -  Patient has multiple wounds/ulcers on his markedly edematous lower extremities.  Will briefly treated with antibiotics but after previous MD discussed with the daughter it seems like these like changes are chronic and antibiotics were stopped  Hypomagnesemia.  Replaced.    Hyperkalemia. Improved after after Lokelma 3 doses.     DM2 Blood sugars under reasonable control on low-dose SSI AC at bedtime  Lab Results  Component Value Date   HGBA1C 7.6 (H) 01/13/2021   CBG (last 3)  Recent Labs    01/21/21 1625 01/21/21 2057 01/22/21 0737  GLUCAP 221* 193* 143*     Disposition Await SNF bed  COVID-vaccine #1 was given - 01/17/2021   DVT prophylaxis: Lovenox Code Status: Full Family Communication: None Disposition Plan:   Anticipated Discharge Location: SNF  Barriers to Discharge: SNF  Is patient medically stable for Discharge: Yes   Scheduled Meds:  aspirin  EC  81 mg Oral Daily   enoxaparin (LOVENOX) injection  40 mg Subcutaneous Q24H   insulin aspart  0-9 Units Subcutaneous TID WC & HS   LORazepam  1 mg Intravenous Once   midodrine  5 mg Oral Once   pravastatin  20 mg Oral Daily   sulfamethoxazole-trimethoprim  1 tablet Oral Q12H   Continuous Infusions:    Data Reviewed: Recent Labs  Lab 01/18/21 0731 01/19/21 0152 01/21/21 0137 01/22/21 0549   WBC 21.8* 15.7* 10.9* 10.6*  HGB 8.8* 8.0* 8.9* 8.7*  HCT 27.9* 24.8* 29.0* 27.0*  PLT 260 270 263 329  MCV 101.1* 97.6 102.1* 97.1  MCH 31.9 31.5 31.3 31.3  MCHC 31.5 32.3 30.7 32.2  RDW 13.9 13.8 14.0 13.9  LYMPHSABS 0.4* 1.2 1.5 1.7  MONOABS 0.4 0.9 1.1* 1.0  EOSABS 0.0 0.3 0.3 0.4  BASOSABS 0.0 0.1 0.0 0.1    Recent Labs  Lab 01/16/21 0743 01/18/21 0726 01/18/21 0731 01/18/21 1000 01/18/21 1509 01/19/21 0152 01/21/21 0137 01/22/21 0549  NA  --  137  --   --   --  135 134* 134*  K  --  5.4*  --   --   --  4.9 5.0 4.9  CL  --  115*  --   --   --  113* 111 111  CO2  --  15*  --   --   --  15* 15* 16*  GLUCOSE  --  115*  --   --   --  122* 159* 151*  BUN  --  28*  --   --   --  33* 29* 29*  CREATININE  --  0.87  --   --   --  1.12 1.04 1.06  CALCIUM  --  8.2*  --   --   --  8.1* 8.3* 8.3*  AST  --   --   --   --   --  28 15 66*  ALT  --   --   --   --   --  26 15 34  ALKPHOS  --   --   --   --   --  83 91 111  BILITOT  --   --   --   --   --  0.4 0.3 0.5  ALBUMIN  --   --   --   --   --  2.0* 1.9* 1.9*  MG 1.8 1.4*  --   --   --  1.8 1.8 1.8  PROCALCITON  --   --   --   --  71.60 84.66 35.65  --   TSH  --   --   --  1.570  --   --   --   --   BNP  --   --  205.9*  --   --  250.5*  --  137.3*        Await SNF bed. Studies: DG Elbow 2 Views Right  Result Date: 01/20/2021 CLINICAL DATA:  Right elbow wound drainage EXAM: RIGHT ELBOW - 2 VIEW COMPARISON:  09/05/2020 FINDINGS: Frontal, oblique, lateral views of the right elbow are obtained. Evaluation limited by patient cooperation and positioning. There is a large dorsal soft tissue defect overlying the olecranon. Extensive soft tissue swelling throughout the elbow and visualized portion of the right upper extremity. Marked periarticular osteopenia. I do not see any discrete cortical destruction or periosteal reaction. No definite joint effusion. IMPRESSION:  1. Diffuse soft tissue edema, with large soft tissue defect  overlying the olecranon consistent with ulceration. Findings compatible with diffuse cellulitis. 2. Periarticular osteopenia, without definite bony destruction to suggest osteomyelitis. Electronically Signed   By: Randa Ngo M.D.   On: 01/20/2021 16:41    Principal Problem:   TIA (transient ischemic attack) Active Problems:   Essential hypertension   Diabetes mellitus without complication (HCC)   Wounds, multiple   Paraplegia (HCC)   Bilateral cellulitis of lower leg   Gram-negative bacteremia  Signature  Lala Lund M.D on 01/22/2021 at 12:04 PM   -  To page go to [email or web address]    LOS: 9 days

## 2021-01-23 LAB — CBC WITH DIFFERENTIAL/PLATELET
Abs Immature Granulocytes: 0.14 10*3/uL — ABNORMAL HIGH (ref 0.00–0.07)
Basophils Absolute: 0 10*3/uL (ref 0.0–0.1)
Basophils Relative: 0 %
Eosinophils Absolute: 0.3 10*3/uL (ref 0.0–0.5)
Eosinophils Relative: 2 %
HCT: 25.6 % — ABNORMAL LOW (ref 39.0–52.0)
Hemoglobin: 8.3 g/dL — ABNORMAL LOW (ref 13.0–17.0)
Immature Granulocytes: 1 %
Lymphocytes Relative: 29 %
Lymphs Abs: 3.5 10*3/uL (ref 0.7–4.0)
MCH: 31.6 pg (ref 26.0–34.0)
MCHC: 32.4 g/dL (ref 30.0–36.0)
MCV: 97.3 fL (ref 80.0–100.0)
Monocytes Absolute: 0.6 10*3/uL (ref 0.1–1.0)
Monocytes Relative: 5 %
Neutro Abs: 7.6 10*3/uL (ref 1.7–7.7)
Neutrophils Relative %: 63 %
Platelets: 310 10*3/uL (ref 150–400)
RBC: 2.63 MIL/uL — ABNORMAL LOW (ref 4.22–5.81)
RDW: 14.1 % (ref 11.5–15.5)
WBC: 12.2 10*3/uL — ABNORMAL HIGH (ref 4.0–10.5)
nRBC: 0.4 % — ABNORMAL HIGH (ref 0.0–0.2)

## 2021-01-23 LAB — COMPREHENSIVE METABOLIC PANEL
ALT: 34 U/L (ref 0–44)
AST: 41 U/L (ref 15–41)
Albumin: 2 g/dL — ABNORMAL LOW (ref 3.5–5.0)
Alkaline Phosphatase: 105 U/L (ref 38–126)
Anion gap: 9 (ref 5–15)
BUN: 33 mg/dL — ABNORMAL HIGH (ref 8–23)
CO2: 15 mmol/L — ABNORMAL LOW (ref 22–32)
Calcium: 8.1 mg/dL — ABNORMAL LOW (ref 8.9–10.3)
Chloride: 113 mmol/L — ABNORMAL HIGH (ref 98–111)
Creatinine, Ser: 1.32 mg/dL — ABNORMAL HIGH (ref 0.61–1.24)
GFR, Estimated: 56 mL/min — ABNORMAL LOW (ref >60)
Glucose, Bld: 209 mg/dL — ABNORMAL HIGH (ref 70–99)
Potassium: 5.6 mmol/L — ABNORMAL HIGH (ref 3.5–5.1)
Sodium: 137 mmol/L (ref 135–145)
Total Bilirubin: 0.5 mg/dL (ref 0.3–1.2)
Total Protein: 5.8 g/dL — ABNORMAL LOW (ref 6.5–8.1)

## 2021-01-23 LAB — CULTURE, BLOOD (ROUTINE X 2): Culture: NO GROWTH

## 2021-01-23 LAB — MAGNESIUM: Magnesium: 1.8 mg/dL (ref 1.7–2.4)

## 2021-01-23 LAB — RESP PANEL BY RT-PCR (FLU A&B, COVID) ARPGX2
Influenza A by PCR: NEGATIVE
Influenza B by PCR: NEGATIVE
SARS Coronavirus 2 by RT PCR: NEGATIVE

## 2021-01-23 LAB — PROTIME-INR
INR: 1 (ref 0.8–1.2)
Prothrombin Time: 13.1 seconds (ref 11.4–15.2)

## 2021-01-23 LAB — GLUCOSE, CAPILLARY
Glucose-Capillary: 141 mg/dL — ABNORMAL HIGH (ref 70–99)
Glucose-Capillary: 202 mg/dL — ABNORMAL HIGH (ref 70–99)
Glucose-Capillary: 154 mg/dL — ABNORMAL HIGH (ref 70–99)
Glucose-Capillary: 221 mg/dL — ABNORMAL HIGH (ref 70–99)

## 2021-01-23 LAB — PROCALCITONIN: Procalcitonin: 26.94 ng/mL

## 2021-01-23 LAB — BRAIN NATRIURETIC PEPTIDE: B Natriuretic Peptide: 78.3 pg/mL (ref 0.0–100.0)

## 2021-01-23 MED ORDER — LACTATED RINGERS IV SOLN: INTRAVENOUS | Status: AC

## 2021-01-23 MED ORDER — SODIUM CHLORIDE 0.9 % IV SOLN
2.0000 g | INTRAVENOUS | Status: DC
  Administered 2021-01-23: 2 g via INTRAVENOUS
  Filled 2021-01-23: qty 20

## 2021-01-23 MED ORDER — SODIUM ZIRCONIUM CYCLOSILICATE 10 G PO PACK
10.0000 g | PACK | Freq: Two times a day (BID) | ORAL | Status: AC
  Administered 2021-01-23 (×2): 10 g via ORAL
  Filled 2021-01-23 (×2): qty 1

## 2021-01-23 MED ORDER — MIDODRINE HCL 5 MG PO TABS
2.5000 mg | ORAL_TABLET | Freq: Two times a day (BID) | ORAL | Status: DC
  Administered 2021-01-23 – 2021-01-24 (×3): 2.5 mg via ORAL
  Filled 2021-01-23 (×2): qty 1

## 2021-01-23 NOTE — Progress Notes (Signed)
PROGRESS NOTE    [name] [name]  FGH:[id number]  DOB: 06-10-1945  DOA: 01/12/2021 PCP: Beckie Salts, MD Outpatient Specialists:   Hospital course:  76 year old man with paraplegia/functional quadriplegia, DM, PVD S/PE right elbow amputation was admitted yesterday for bilateral lower extremity cellulitis and possible right facial droop.   Subjective: Patient in bed, appears comfortable, denies any headache, no fever, no chest pain or pressure, no shortness of breath , no abdominal pain. No new focal weakness.   Objective: Vitals:   01/22/21 2000 01/23/21 0000 01/23/21 0755 01/23/21 1211  BP: (!) 101/59 (!) 100/30 (!) 93/56 111/61  Pulse: 88 84 86 76  Resp: 18 18 18 17   Temp: 99 F (37.2 C) 98.9 F (37.2 C) 98.8 F (37.1 C) 98 F (36.7 C)  TempSrc: Oral Oral Oral Oral  SpO2: 94% 93% 94% 96%  Weight:      Height:        Intake/Output Summary (Last 24 hours) at 01/23/2021 1222 Last data filed at 01/23/2021 0800 Gross per 24 hour  Intake 1036 ml  Output 650 ml  Net 386 ml   Filed Weights   01/12/21 1953  Weight: 87.6 kg     Exam:  Awake Alert, No new F.N deficits, Normal affect [place].AT,PERRAL Supple Neck, No JVD,   Symmetrical Chest wall movement, Good air movement bilaterally, CTAB RRR,No Gallops, Rubs or new Murmurs,  +ve B.Sounds, Abd Soft, No tenderness,   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   Assessment & Plan:   Altered mental status R/oh CVA R/O metabolic encephalopathy - he was seen by neurology and this was thought to be due to metabolic encephalopathy.  Currently on aspirin and statin for secondary prevention and close to baseline.  Aspiration with hypotension and early sepsis on 01/18/2021 with Serratia bacteremia.  Patient has no teeth and was trying to chew regular diet,  chest x-ray is inconclusive however he developed sudden leukocytosis along with hypotension on 01/18/2021, he also has multiple open sores chronic on both lower extremities.  Serratia bacteremia could be from aspiration versus skin source, UA stable.  Seen by ID, clinically sepsis pathophysiology and bacteremia much better, had responded well to cephalosporin, Serratia sensitivities noted, holding Bactrim due to AKI and hyperkalemia Rocephin 2 g dose today and tomorrow.  AKI with hyperkalemia.  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  Hypotension - see above.  Random cortisol and TSH stable, improved after IV fluids and midodrine midodrine being tapered off.  Bilateral lower extremity erythema secondary to intraepidermal acantholysis -  Patient has multiple wounds/ulcers on his markedly edematous lower extremities.  Will briefly treated with antibiotics but after previous MD discussed with the daughter it seems like these like changes are chronic and antibiotics were stopped  Hypomagnesemia.  Replaced.    DM2 Blood sugars under reasonable control on low-dose SSI AC at bedtime  Lab Results  Component Value Date   HGBA1C 7.6 (H) 01/13/2021   CBG (last 3)  Recent Labs    01/22/21 2042 01/23/21 0724 01/23/21 1111  GLUCAP 161* 141* 202*     Disposition Await SNF bed  COVID-vaccine #1 was given - 01/17/2021   DVT prophylaxis: Lovenox Code Status: Full Family Communication: None Disposition Plan:   Anticipated Discharge Location:  SNF  Barriers to Discharge: SNF  Is patient medically stable for Discharge: Yes   Scheduled Meds:  aspirin EC  81 mg Oral Daily   enoxaparin (LOVENOX) injection  40 mg Subcutaneous Q24H   insulin aspart  0-9 Units Subcutaneous TID WC & HS   LORazepam  1 mg Intravenous Once   midodrine  2.5 mg Oral BID WC   pravastatin  20 mg Oral Daily    sodium zirconium cyclosilicate  10 g Oral BID   Continuous Infusions:  cefTRIAXone (ROCEPHIN)  IV     lactated ringers       Data Reviewed: Recent Labs  Lab 01/18/21 0731 01/19/21 0152 01/21/21 0137 01/22/21 0549 01/23/21 1112  WBC 21.8* 15.7* 10.9* 10.6* 12.2*  HGB 8.8* 8.0* 8.9* 8.7* 8.3*  HCT 27.9* 24.8* 29.0* 27.0* 25.6*  PLT 260 270 263 329 310  MCV 101.1* 97.6 102.1* 97.1 97.3  MCH 31.9 31.5 31.3 31.3 31.6  MCHC 31.5 32.3 30.7 32.2 32.4  RDW 13.9 13.8 14.0 13.9 14.1  LYMPHSABS 0.4* 1.2 1.5 1.7 PENDING  MONOABS 0.4 0.9 1.1* 1.0 PENDING  EOSABS 0.0 0.3 0.3 0.4 PENDING  BASOSABS 0.0 0.1 0.0 0.1 PENDING    Recent Labs  Lab 01/18/21 0726 01/18/21 0731 01/18/21 1000 01/18/21 1509 01/19/21 0152 01/21/21 0137 01/22/21 0549 01/23/21 0725 01/23/21 1112  NA 137  --   --   --  135 134* 134*  --  137  K 5.4*  --   --   --  4.9 5.0 4.9  --  5.6*  CL 115*  --   --   --  113* 111 111  --  113*  CO2 15*  --   --   --  15* 15* 16*  --  15*  GLUCOSE 115*  --   --   --  122* 159* 151*  --  209*  BUN 28*  --   --   --  33* 29* 29*  --  33*  CREATININE 0.87  --   --   --  1.12 1.04 1.06  --  1.32*  CALCIUM 8.2*  --   --   --  8.1* 8.3* 8.3*  --  8.1*  AST  --   --   --   --  28 15 66*  --  41  ALT  --   --   --   --  26 15 34  --  34  ALKPHOS  --   --   --   --  83 91 111  --  105  BILITOT  --   --   --   --  0.4 0.3 0.5  --  0.5  ALBUMIN  --   --   --   --  2.0* 1.9* 1.9*  --  2.0*  MG 1.4*  --   --   --  1.8 1.8 1.8  --  1.8  PROCALCITON  --   --   --  71.60 84.66 35.65 34.90  --   --   INR  --   --   --   --   --   --   --  1.0  --   TSH  --   --  1.570  --   --   --   --   --   --   BNP  --  205.9*  --   --  250.5*  --  137.3*  --   --  Await SNF bed. Studies: No results found.  Principal Problem:   TIA (transient ischemic attack) Active Problems:   Essential hypertension   Diabetes mellitus without complication (HCC)   Wounds, multiple   Paraplegia  (HCC)   Bilateral cellulitis of lower leg   Gram-negative bacteremia  Signature  Lala Lund M.D on 01/23/2021 at 12:22 PM   -  To page go to [email or web address]    LOS: 10 days

## 2021-01-24 LAB — CBC WITH DIFFERENTIAL/PLATELET
Abs Immature Granulocytes: 0.15 10*3/uL — ABNORMAL HIGH (ref 0.00–0.07)
Basophils Absolute: 0 10*3/uL (ref 0.0–0.1)
Basophils Relative: 0 %
Eosinophils Absolute: 0.4 10*3/uL (ref 0.0–0.5)
Eosinophils Relative: 3 %
HCT: 27.5 % — ABNORMAL LOW (ref 39.0–52.0)
Hemoglobin: 8.9 g/dL — ABNORMAL LOW (ref 13.0–17.0)
Immature Granulocytes: 1 %
Lymphocytes Relative: 27 %
Lymphs Abs: 3.3 10*3/uL (ref 0.7–4.0)
MCH: 31.4 pg (ref 26.0–34.0)
MCHC: 32.4 g/dL (ref 30.0–36.0)
MCV: 97.2 fL (ref 80.0–100.0)
Monocytes Absolute: 1 10*3/uL (ref 0.1–1.0)
Monocytes Relative: 8 %
Neutro Abs: 7.6 10*3/uL (ref 1.7–7.7)
Neutrophils Relative %: 61 %
Platelets: 396 10*3/uL (ref 150–400)
RBC: 2.83 MIL/uL — ABNORMAL LOW (ref 4.22–5.81)
RDW: 14.1 % (ref 11.5–15.5)
WBC: 12.4 10*3/uL — ABNORMAL HIGH (ref 4.0–10.5)
nRBC: 0.2 % (ref 0.0–0.2)

## 2021-01-24 LAB — COMPREHENSIVE METABOLIC PANEL
ALT: 45 U/L — ABNORMAL HIGH (ref 0–44)
AST: 55 U/L — ABNORMAL HIGH (ref 15–41)
Albumin: 2.2 g/dL — ABNORMAL LOW (ref 3.5–5.0)
Alkaline Phosphatase: 117 U/L (ref 38–126)
Anion gap: 8 (ref 5–15)
BUN: 33 mg/dL — ABNORMAL HIGH (ref 8–23)
CO2: 19 mmol/L — ABNORMAL LOW (ref 22–32)
Calcium: 8.2 mg/dL — ABNORMAL LOW (ref 8.9–10.3)
Chloride: 111 mmol/L (ref 98–111)
Creatinine, Ser: 1.15 mg/dL (ref 0.61–1.24)
GFR, Estimated: >60 mL/min (ref >60)
Glucose, Bld: 126 mg/dL — ABNORMAL HIGH (ref 70–99)
Potassium: 5.2 mmol/L — ABNORMAL HIGH (ref 3.5–5.1)
Sodium: 138 mmol/L (ref 135–145)
Total Bilirubin: 0.3 mg/dL (ref 0.3–1.2)
Total Protein: 6.6 g/dL (ref 6.5–8.1)

## 2021-01-24 LAB — MAGNESIUM: Magnesium: 1.9 mg/dL (ref 1.7–2.4)

## 2021-01-24 LAB — GLUCOSE, CAPILLARY
Glucose-Capillary: 112 mg/dL — ABNORMAL HIGH (ref 70–99)
Glucose-Capillary: 197 mg/dL — ABNORMAL HIGH (ref 70–99)

## 2021-01-24 LAB — PROCALCITONIN: Procalcitonin: 20.78 ng/mL

## 2021-01-24 LAB — BRAIN NATRIURETIC PEPTIDE: B Natriuretic Peptide: 43.1 pg/mL (ref 0.0–100.0)

## 2021-01-24 MED ORDER — SODIUM BICARBONATE 650 MG PO TABS: 650.0000 mg | ORAL_TABLET | Freq: Two times a day (BID) | ORAL | 0 refills | Status: AC

## 2021-01-24 MED ORDER — SODIUM POLYSTYRENE SULFONATE 15 GM/60ML PO SUSP
30.0000 g | Freq: Once | ORAL | Status: AC
Start: 2021-01-24 — End: 2021-01-24
  Administered 2021-01-24: 30 g via ORAL
  Filled 2021-01-24: qty 120

## 2021-01-24 MED ORDER — SODIUM ZIRCONIUM CYCLOSILICATE 10 G PO PACK
10.0000 g | PACK | Freq: Four times a day (QID) | ORAL | Status: DC
  Administered 2021-01-24: 10 g via ORAL
  Filled 2021-01-24: qty 1

## 2021-01-24 MED ORDER — SODIUM BICARBONATE 650 MG PO TABS
650.0000 mg | ORAL_TABLET | Freq: Two times a day (BID) | ORAL | Status: DC
  Administered 2021-01-24: 650 mg via ORAL
  Filled 2021-01-24: qty 1

## 2021-01-24 MED ORDER — SODIUM CHLORIDE 0.9 % IV SOLN
2.0000 g | INTRAVENOUS | Status: DC
  Administered 2021-01-24: 2 g via INTRAVENOUS
  Filled 2021-01-24: qty 20

## 2021-01-24 MED ORDER — ASPIRIN 81 MG PO TBEC: 81.0000 mg | DELAYED_RELEASE_TABLET | Freq: Every day | ORAL | 11 refills | Status: AC

## 2021-01-24 MED ORDER — CEFDINIR 300 MG PO CAPS: 300.0000 mg | ORAL_CAPSULE | Freq: Two times a day (BID) | ORAL | 0 refills | Status: DC

## 2021-01-24 MED ORDER — MIDODRINE HCL 2.5 MG PO TABS: 2.5000 mg | ORAL_TABLET | Freq: Two times a day (BID) | ORAL | Status: DC

## 2021-01-24 MED ORDER — LACTATED RINGERS IV BOLUS
500.0000 mL | Freq: Once | INTRAVENOUS | Status: AC
  Administered 2021-01-24: 500 mL via INTRAVENOUS

## 2021-01-24 NOTE — Discharge Instructions (Signed)
Follow with Primary MD Beckie Salts, MD in 7 days   Get CBC, CMP, 2 view Chest X ray -  checked next visit within 1 week by SNF MD    Activity: As tolerated with Full fall precautions use walker/cane & assistance as needed  Disposition SNF  Diet: Dysphagia 3 diet with feeding assistance and aspiration precautions.    Special Instructions: If you have smoked or chewed Tobacco  in the last 2 yrs please stop smoking, stop any regular Alcohol  and or any Recreational drug use.  On your next visit with your primary care physician please Get Medicines reviewed and adjusted.  Please request your Prim.MD to go over all Hospital Tests and Procedure/Radiological results at the follow up, please get all Hospital records sent to your Prim MD by signing hospital release before you go home.  If you experience worsening of your admission symptoms, develop shortness of breath, life threatening emergency, suicidal or homicidal thoughts you must seek medical attention immediately by calling 911 or calling your MD immediately  if symptoms less severe.  You Must read complete instructions/literature along with all the possible adverse reactions/side effects for all the Medicines you take and that have been prescribed to you. Take any new Medicines after you have completely understood and accpet all the possible adverse reactions/side effects.
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
## 2021-01-24 NOTE — Discharge Summary (Addendum)
[name] [name] XBJ:[id number] DOB: 10/20/1945 DOA: 01/12/2021  PCP: Beckie Salts, MD  Admit date: 01/12/2021  Discharge date: 01/24/2021  Admitted From: Home   Disposition:  SNF   Recommendations for Outpatient Follow-up:   Follow up with PCP in 1-2 weeks  PCP Please obtain BMP/CBC, 2 view CXR in 1week,  (see Discharge instructions)   PCP Please follow up on the following pending results: Check CBC, CMP, magnesium in 2 days at [facility].   Home Health: None   Equipment/Devices: None  Consultations: ID Discharge Condition: Stable    CODE STATUS: Full    Diet Recommendation: Dysphagia 3 diet    Chief Complaint  Patient presents with   Code Stroke     Brief history of present illness from the day of admission and additional interim summary    76 year old man with paraplegia/functional quadriplegia, DM, PVD S/PE right elbow amputation was admitted yesterday for bilateral lower extremity cellulitis and ?? right facial droop.                                                                  Hospital Course    Altered mental status R/oh CVA R/O metabolic encephalopathy - he was seen by neurology and this was thought to be due to metabolic encephalopathy.  Currently on aspirin and statin for secondary prevention and close to baseline.   Aspiration with hypotension and early sepsis on 01/18/2021 with Serratia bacteremia.  Patient has no teeth and was trying to chew regular diet, chest x-ray is inconclusive however he developed sudden leukocytosis along with hypotension on 01/18/2021, he also has multiple open sores chronic on both lower extremities.  Serratia bacteremia could be from aspiration versus skin source, UA stable.  Seen by ID, clinically sepsis pathophysiology and bacteremia much better, had responded well to  cephalosporin, Serratia sensitivities noted, holding Bactrim due to AKI and hyperkalemia clinically much better on cephalosporin and will finish 1 more week of oral cefdinir upon discharge.    AKI with hyperkalemia.  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  Repeat CMP in 2 days at [facility].  He also had mild metabolic acidosis which has much improved with hydration, placed on oral bicarb for a week, stop once bicarb is near normal.   Hypotension - see above.  Random cortisol and TSH stable, improved after IV fluids, on midodrine.  Stable on low-dose midodrine which will be continued.   Bilateral lower extremity erythema secondary to intraepidermal acantholysis -  Patient has multiple wounds/ulcers on his markedly edematous lower extremities.  Will briefly treated with antibiotics but after previous MD discussed with the daughter it seems like these like changes are chronic continue nursing  based wound care at [facility].   Hypomagnesemia.  Replaced.     DM2 on Glucophage.     Discharge diagnosis     Principal Problem:   TIA (transient ischemic attack) Active Problems:   Essential hypertension   Diabetes mellitus without complication (HCC)   Wounds, multiple   Paraplegia ([place])   Bilateral cellulitis of lower leg   Gram-negative bacteremia    Discharge instructions    Discharge Instructions     Discharge instructions   Complete by: As directed    Follow with Primary MD Beckie Salts, MD in 7 days   Get CBC, CMP, 2 view Chest X ray -  checked next visit within 1 week by SNF MD    Activity: As tolerated with Full fall precautions use walker/cane & assistance as needed  Disposition SNF  Diet: Dysphagia 3 diet with feeding assistance and aspiration precautions.    Special Instructions: If you have smoked or chewed Tobacco  in the last 2 yrs please  stop smoking, stop any regular Alcohol  and or any Recreational drug use.  On your next visit with your primary care physician please Get Medicines reviewed and adjusted.  Please request your Prim.MD to go over all Hospital Tests and Procedure/Radiological results at the follow up, please get all Hospital records sent to your Prim MD by signing hospital release before you go home.  If you experience worsening of your admission symptoms, develop shortness of breath, life threatening emergency, suicidal or homicidal thoughts you must seek medical attention immediately by calling 911 or calling your MD immediately  if symptoms less severe.  You Must read complete instructions/literature along with all the possible adverse reactions/side effects for all the Medicines you take and that have been prescribed to you. Take any new Medicines after you have completely understood and accpet all the possible adverse reactions/side effects.   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 Swallow whole. Start taking on: January 25, 2021   cefdinir 300 MG capsule Commonly known as: OMNICEF Take 1 capsule (300 mg total) by mouth 2 (two) times daily.   metFORMIN 500 MG 24 hr tablet Commonly known as: GLUCOPHAGE-XR Take 1 tablet (500 mg total) by mouth 2 (two) times daily. What  changed: when to take this   midodrine 2.5 MG tablet Commonly known as: PROAMATINE Take 1 tablet (2.5 mg total) by mouth 2 (two) times daily with a meal.   pravastatin 20 MG tablet Commonly known as: PRAVACHOL Take 20 mg by mouth daily.   sodium bicarbonate 650 MG tablet Take 1 tablet (650 mg total) by mouth 2 (two) times daily for 6 days.   triamcinolone cream 0.1 % Commonly known as: KENALOG Apply  1 application topically See admin instructions. 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 Schedule an appointment as soon as possible for a visit in 1 week(s).   Specialty: Internal Medicine Contact information: [address] ,[place] [address] [place] [address] [phone]              Contact information for after-discharge care     Destination     [facility] .   Service: Skilled Nursing Contact information: [address] [place] [phone]                     Major procedures and Radiology Reports - PLEASE review detailed and final reports thoroughly  -       DG Elbow 2 Views Right  Result Date: 01/20/2021 CLINICAL DATA:  Right elbow wound drainage EXAM: RIGHT ELBOW - 2 VIEW COMPARISON:  09/05/2020 FINDINGS: Frontal, oblique, lateral views of the right elbow are obtained. Evaluation limited by patient cooperation and positioning. There is a large dorsal soft tissue defect overlying the olecranon. Extensive soft tissue swelling throughout the elbow and visualized portion of the right upper extremity. Marked periarticular osteopenia. I do not see  any discrete cortical destruction or periosteal reaction. No definite joint effusion. IMPRESSION: 1. Diffuse soft tissue edema, with large soft tissue defect overlying the olecranon consistent with ulceration. Findings compatible with diffuse cellulitis. 2. Periarticular osteopenia, without definite bony destruction to suggest osteomyelitis. Electronically Signed   By: Randa Ngo M.D.   On: 01/20/2021 16:41   MR ANGIO HEAD WO CONTRAST  Result Date: 01/13/2021 CLINICAL DATA:  Transient ischemic attack EXAM: MRI HEAD WITHOUT CONTRAST MRA HEAD WITHOUT CONTRAST MRA NECK WITHOUT CONTRAST TECHNIQUE: Multiplanar, multiecho pulse sequences of the brain and surrounding structures were obtained without intravenous contrast. Angiographic images of the Circle of Willis were obtained using MRA technique without intravenous contrast. Angiographic images of the neck were obtained using MRA technique without intravenous contrast. Carotid stenosis measurements (when applicable) are obtained utilizing NASCET criteria, using the distal internal carotid diameter as the denominator. COMPARISON:  None. FINDINGS: MRI HEAD FINDINGS Brain: No acute infarct, mass effect or extra-axial collection. No acute or chronic hemorrhage. Hyperintense T2-weighted signal is moderately widespread throughout the white matter. Generalized volume loss without a clear lobar predilection. Old bilateral cerebellar infarcts. The midline structures are normal. Vascular: Major flow voids are preserved. Skull and upper cervical spine: Normal calvarium and skull base. Visualized upper cervical spine and soft tissues are normal. Sinuses/Orbits:No paranasal sinus fluid levels or advanced mucosal thickening. No mastoid or middle ear effusion. Normal orbits. MRA HEAD FINDINGS POSTERIOR CIRCULATION: --Vertebral arteries: Normal --Inferior cerebellar arteries: Normal. --Basilar artery: Normal. --Superior cerebellar arteries: Normal. --Posterior cerebral  arteries: Normal. ANTERIOR CIRCULATION: --Intracranial internal carotid arteries: Normal. --Anterior cerebral arteries (ACA): Normal. --Middle cerebral arteries (MCA): Normal. ANATOMIC VARIANTS: None MRA NECK FINDINGS Left dominant vertebral arteries. Both are patent without stenosis.  No carotid stenosis. Mild motion degradation. IMPRESSION: 1. No acute intracranial abnormality. 2. 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 3. Normal MRA of the head and neck. Electronically Signed   By: Ulyses Jarred M.D.   On: 01/13/2021 01:22   MR ANGIO NECK WO CONTRAST  Result Date: 01/13/2021 CLINICAL DATA:  Transient ischemic attack EXAM: MRI HEAD WITHOUT CONTRAST MRA HEAD WITHOUT CONTRAST MRA NECK WITHOUT CONTRAST TECHNIQUE: Multiplanar, multiecho pulse sequences of the brain and surrounding structures were obtained without intravenous contrast. Angiographic images of the Circle of Willis were obtained using MRA technique without intravenous contrast. Angiographic images of the neck were obtained using MRA technique without intravenous contrast. Carotid stenosis measurements (when applicable) are obtained utilizing NASCET criteria, using the distal internal carotid diameter as the denominator. COMPARISON:  None. FINDINGS: MRI HEAD FINDINGS Brain: No acute infarct, mass effect or extra-axial collection. No acute or chronic hemorrhage. Hyperintense T2-weighted signal is moderately widespread throughout the white matter. Generalized volume loss without a clear lobar predilection. Old bilateral cerebellar infarcts. The midline structures are normal. Vascular: Major flow voids are preserved. Skull and upper cervical spine: Normal calvarium and skull base. Visualized upper cervical spine and soft tissues are normal. Sinuses/Orbits:No paranasal sinus fluid levels or advanced mucosal thickening. No mastoid or middle ear effusion. Normal orbits. MRA HEAD FINDINGS POSTERIOR CIRCULATION: --Vertebral arteries: Normal  --Inferior cerebellar arteries: Normal. --Basilar artery: Normal. --Superior cerebellar arteries: Normal. --Posterior cerebral arteries: Normal. ANTERIOR CIRCULATION: --Intracranial internal carotid arteries: Normal. --Anterior cerebral arteries (ACA): Normal. --Middle cerebral arteries (MCA): Normal. ANATOMIC VARIANTS: None MRA NECK FINDINGS Left dominant vertebral arteries. Both are patent without stenosis. No carotid stenosis. Mild motion degradation. IMPRESSION: 1. No acute intracranial abnormality. 2. 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 3. Normal MRA of the head and neck. Electronically Signed   By: Ulyses Jarred M.D.   On: 01/13/2021 01:22   MR BRAIN WO CONTRAST  Result Date: 01/13/2021 CLINICAL DATA:  Transient ischemic attack EXAM: MRI HEAD WITHOUT CONTRAST MRA HEAD WITHOUT CONTRAST MRA NECK WITHOUT CONTRAST TECHNIQUE: Multiplanar, multiecho pulse sequences of the brain and surrounding structures were obtained without intravenous contrast. Angiographic images of the Circle of Willis were obtained using MRA technique without intravenous contrast. Angiographic images of the neck were obtained using MRA technique without intravenous contrast. Carotid stenosis measurements (when applicable) are obtained utilizing NASCET criteria, using the distal internal carotid diameter as the denominator. COMPARISON:  None. FINDINGS: MRI HEAD FINDINGS Brain: No acute infarct, mass effect or extra-axial collection. No acute or chronic hemorrhage. Hyperintense T2-weighted signal is moderately widespread throughout the white matter. Generalized volume loss without a clear lobar predilection. Old bilateral cerebellar infarcts. The midline structures are normal. Vascular: Major flow voids are preserved. Skull and upper cervical spine: Normal calvarium and skull base. Visualized upper cervical spine and soft tissues are normal. Sinuses/Orbits:No paranasal sinus fluid levels or advanced mucosal thickening. No  mastoid or middle ear effusion. Normal orbits. MRA HEAD FINDINGS POSTERIOR CIRCULATION: --Vertebral arteries: Normal --Inferior cerebellar arteries: Normal. --Basilar artery: Normal. --Superior cerebellar arteries: Normal. --Posterior cerebral arteries: Normal. ANTERIOR CIRCULATION: --Intracranial internal carotid arteries: Normal. --Anterior cerebral arteries (ACA): Normal. --Middle cerebral arteries (MCA): Normal. ANATOMIC VARIANTS: None MRA NECK FINDINGS Left dominant vertebral arteries. Both are patent without stenosis. No carotid stenosis. Mild motion degradation. IMPRESSION: 1. No acute intracranial abnormality. 2. 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 3. Normal MRA of the head and neck. Electronically Signed   By: Ulyses Jarred  M.D.   On: 01/13/2021 01:22   DG Chest Port 1 View  Result Date: 01/19/2021 CLINICAL DATA:  Shortness of breath EXAM: PORTABLE CHEST 1 VIEW COMPARISON:  Previous studies including the examination of 01/18/2021 FINDINGS: Transverse diameter of heart is increased. There is poor inspiration. Central pulmonary vessels are less prominent. There is improvement in aeration of lower lung fields. There are small linear densities in the lower lung fields. There is no new focal pulmonary consolidation. Right lateral CP angle is indistinct. There is no pneumothorax. IMPRESSION: There is interval decrease in pulmonary vascular congestion. Linear densities seen in the lower lung fields suggesting crowding of bronchovascular structures or subsegmental atelectasis. Electronically Signed   By: Elmer Picker M.D.   On: 01/19/2021 08:22   DG Chest Port 1 View  Result Date: 01/18/2021 CLINICAL DATA:  Eval for SOB, pt seems to have a congested cough - hx of diabetes, htn - pt unable to lay completely flat and head wont go up out of chest so best obtainable image EXAM: PORTABLE CHEST - 1 VIEW COMPARISON:  01/12/2021 FINDINGS: Relatively low lung volumes as before. Worsening  ill-defined airspace and interstitial opacities in both lung bases. Suspect mild central pulmonary vascular congestion. Heart size and mediastinal contours are within normal limits. Persistent mild blunting of the right lateral costophrenic angle. Visualized bones unremarkable. IMPRESSION: Worsening  perihilar and bibasilar edema or infiltrates. Electronically Signed   By: Lucrezia Europe M.D.   On: 01/18/2021 07:57   DG Chest Port 1 View  Result Date: 01/12/2021 CLINICAL DATA:  Altered mental status. EXAM: PORTABLE CHEST 1 VIEW COMPARISON:  January 11, 2020 FINDINGS: The study is limited secondary to patient rotation. Low lung volumes are seen with mild areas of atelectasis noted within the bilateral lung bases. There is no evidence of a pleural effusion or pneumothorax. The heart size and mediastinal contours are within normal limits. The visualized skeletal structures are unremarkable. IMPRESSION: Low lung volumes with mild bibasilar atelectasis. Electronically Signed   By: Virgina Norfolk M.D.   On: 01/12/2021 21:16   ECHOCARDIOGRAM COMPLETE  Result Date: 01/13/2021    ECHOCARDIOGRAM REPORT   Patient Name:   NIVEK POWLEY Date of Exam: 01/13/2021 Medical Rec #:  [id number]       Height:       68.0 in Accession #:    [id number]      Weight:       193.1 lb Date of Birth:  1945-06-22       BSA:          2.014 m Patient Age:    11 years        BP:           115/73 mmHg Patient Gender: M               HR:           100 bpm. Exam Location:  Inpatient Procedure: 2D Echo, Cardiac Doppler and Color Doppler Indications:    TIA  History:        Patient has no prior history of Echocardiogram examinations.                 Risk Factors:Hypertension and Diabetes.  Sonographer:    Glo Herring Referring Phys: [id number] [place]  1. Left ventricular ejection fraction, by estimation, is 60 to 65%. The left ventricle has normal function. The left ventricle has no regional wall motion abnormalities.  Left ventricular diastolic parameters  are consistent with Grade I diastolic dysfunction (impaired relaxation).  2. Right ventricular systolic function is normal. The right ventricular size is normal. There is mildly elevated pulmonary artery systolic pressure. The estimated right ventricular systolic pressure is 41.2 mmHg.  3. The mitral valve is grossly normal. No evidence of mitral valve regurgitation. No evidence of mitral stenosis.  4. The aortic valve is tricuspid. Aortic valve regurgitation is not visualized. No aortic stenosis is present.  5. The inferior vena cava is normal in size with greater than 50% respiratory variability, suggesting right atrial pressure of 3 mmHg. Conclusion(s)/Recommendation(s): No intracardiac source of embolism detected on this transthoracic study. Consider a transesophageal echocardiogram to exclude cardiac source of embolism if clinically indicated. FINDINGS  Left Ventricle: Left ventricular ejection fraction, by estimation, is 60 to 65%. The left ventricle has normal function. The left ventricle has no regional wall motion abnormalities. The left ventricular internal cavity size was normal in size. There is  no left ventricular hypertrophy. Left ventricular diastolic parameters are consistent with Grade I diastolic dysfunction (impaired relaxation). Right Ventricle: The right ventricular size is normal. No increase in right ventricular wall thickness. Right ventricular systolic function is normal. There is mildly elevated pulmonary artery systolic pressure. The tricuspid regurgitant velocity is 3.01  m/s, and with an assumed right atrial pressure of 3 mmHg, the estimated right ventricular systolic pressure is 87.8 mmHg. Left Atrium: Left atrial size was normal in size. Right Atrium: Right atrial size was normal in size. Pericardium: There is no evidence of pericardial effusion. Mitral Valve: The mitral valve is grossly normal. No evidence of mitral valve regurgitation. No  evidence of mitral valve stenosis. Tricuspid Valve: The tricuspid valve is grossly normal. Tricuspid valve regurgitation is trivial. No evidence of tricuspid stenosis. Aortic Valve: The aortic valve is tricuspid. Aortic valve regurgitation is not visualized. No aortic stenosis is present. Aortic valve mean gradient measures 2.0 mmHg. Aortic valve peak gradient measures 3.1 mmHg. Aortic valve area, by VTI measures 2.30 cm. Pulmonic Valve: The pulmonic valve was grossly normal. Pulmonic valve regurgitation is not visualized. No evidence of pulmonic stenosis. Aorta: The aortic root and ascending aorta are structurally normal, with no evidence of dilitation. Venous: The inferior vena cava is normal in size with greater than 50% respiratory variability, suggesting right atrial pressure of 3 mmHg. IAS/Shunts: The atrial septum is grossly normal.  LEFT VENTRICLE PLAX 2D LVIDd:         3.70 cm   Diastology LVIDs:         2.75 cm   LV e' medial:    5.98 cm/s LV PW:         1.00 cm   LV E/e' medial:  8.3 LV IVS:        1.00 cm   LV e' lateral:   8.27 cm/s LVOT diam:     2.10 cm   LV E/e' lateral: 6.0 LV SV:         43 LV SV Index:   21 LVOT Area:     3.46 cm  IVC IVC diam: 2.10 cm LEFT ATRIUM             Index LA diam:        2.80 cm 1.39 cm/m LA Vol (A2C):   31.5 ml 15.64 ml/m LA Vol (A4C):   23.3 ml 11.57 ml/m LA Biplane Vol: 27.2 ml 13.51 ml/m  AORTIC VALVE  PULMONIC VALVE AV Area (Vmax):    2.61 cm     PV Vmax:       0.68 m/s AV Area (Vmean):   2.34 cm     PV Peak grad:  1.8 mmHg AV Area (VTI):     2.30 cm AV Vmax:           87.60 cm/s AV Vmean:          66.400 cm/s AV VTI:            0.185 m AV Peak Grad:      3.1 mmHg AV Mean Grad:      2.0 mmHg LVOT Vmax:         66.10 cm/s LVOT Vmean:        44.900 cm/s LVOT VTI:          0.123 m LVOT/AV VTI ratio: 0.66  AORTA Ao Root diam: 3.20 cm Ao Asc diam:  3.30 cm MITRAL VALVE               TRICUSPID VALVE MV Area (PHT): 3.83 cm    TR Peak grad:    36.2 mmHg MV Decel Time: 198 msec    TR Vmax:        301.00 cm/s MV E velocity: 49.60 cm/s MV A velocity: 75.00 cm/s  SHUNTS MV E/A ratio:  0.66        Systemic VTI:  0.12 m                            Systemic Diam: 2.10 cm Eleonore Chiquito MD Electronically signed by Eleonore Chiquito MD Signature Date/Time: 01/13/2021/1:43:11 PM    Final    CT HEAD CODE STROKE WO CONTRAST  Result Date: 01/12/2021 CLINICAL DATA:  Code stroke.  Acute neurologic deficit EXAM: CT HEAD WITHOUT CONTRAST TECHNIQUE: Contiguous axial images were obtained from the base of the skull through the vertex without intravenous contrast. COMPARISON:  None. FINDINGS: Brain: There is no mass, hemorrhage or extra-axial collection. There is generalized atrophy without lobar predilection. There is hypoattenuation of the periventricular white matter, most commonly indicating chronic ischemic microangiopathy. 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 Vascular: No abnormal hyperdensity of the major intracranial arteries or dural venous sinuses. No intracranial atherosclerosis. Skull: The visualized skull base, calvarium and extracranial soft tissues are normal. Sinuses/Orbits: No fluid levels or advanced mucosal thickening of the visualized paranasal sinuses. No mastoid or middle ear effusion. The orbits are normal. ASPECTS [facility] Stroke Program Early CT Score) - Ganglionic level infarction (caudate, lentiform nuclei, internal capsule, insula, M1-M3 cortex): 7 - Supraganglionic infarction (M4-M6 cortex): 3 Total score (0-10 with 10 being normal): 10 IMPRESSION: 1. No acute intracranial abnormality. 2. ASPECTS is 10. 3. 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 These results were communicated to Dr. Kerney Elbe at 8:15 pm on 01/12/2021 by text page via the [facility] messaging system. Electronically Signed   By: Ulyses Jarred M.D.   On: 01/12/2021 20:16     Today   Subjective    Christy Gentles today has no headache,no  chest abdominal pain,no new weakness tingling or numbness, feels much better   Objective   Blood pressure 100/61, pulse 77, temperature 98.1 F (36.7 C), temperature source Oral, resp. rate 17, height 5\' 8"  (1.727 m), weight 87.6 kg, SpO2 98 %.   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   Data Review   CBC w Diff:  Lab Results  Component Value Date   WBC 12.4 (H) 01/24/2021   HGB 8.9 (L) 01/24/2021   HCT 27.5 (L) 01/24/2021   PLT 396 01/24/2021   LYMPHOPCT 27 01/24/2021   MONOPCT 8 01/24/2021   EOSPCT 3 01/24/2021   BASOPCT 0 01/24/2021    CMP:  Lab Results  Component Value Date   NA 138 01/24/2021   NA 139 02/19/2018   K 5.2 (H) 01/24/2021   CL 111 01/24/2021   CO2 19 (L) 01/24/2021   BUN 33 (H) 01/24/2021   BUN 55 (A) 02/19/2018   CREATININE 1.15 01/24/2021   GLU 107 02/19/2018   PROT 6.6 01/24/2021   ALBUMIN 2.2 (L) 01/24/2021   BILITOT 0.3 01/24/2021   ALKPHOS 117 01/24/2021   AST 55 (H) 01/24/2021   ALT 45 (H) 01/24/2021  .   Total Time in preparing paper work, data evaluation and todays exam - 8 minutes  Lala Lund M.D on 01/24/2021 at 10:49 AM  [facility]

## 2021-01-24 NOTE — TOC Transition Note (Signed)
Transition of Care [facility]) - CM/SW Discharge Note   Patient Details  Name: [name] [name] MRN: [id number] Date of Birth: 16-Dec-1945  Transition of Care [facility]) CM/SW Contact:  Benard Halsted, LCSW Phone Number: 01/24/2021, 11:36 AM   Clinical Narrative:    Patient will DC to: [place] Anticipated DC date: 01/24/21  Family notified: Daughter,Tarria Transport by: Corey Harold   Per MD patient ready for DC to [place]. RN to call report prior to discharge [phone] Room 205A). RN, patient, patient's family, and facility notified of DC. Discharge Summary and FL2 sent to facility. DC packet on chart. Ambulance transport requested for patient.   CSW will sign off for now as social work intervention is no longer needed. Please consult [place] again if new needs arise.     Final next level of care: Skilled Nursing Facility Barriers to Discharge: Barriers Resolved   Patient Goals and CMS Choice Patient states their goals for this hospitalization and ongoing recovery are:: LTC facility CMS Medicare.gov Compare Post Acute Care list provided to:: Patient Represenative (must comment) Choice offered to / list presented to : Adult Children, [facility] POA / Guardian  Discharge Placement   Existing PASRR number confirmed : 01/24/21          Patient chooses bed at: [facility] Patient to be transferred to facility by: [place] Name of family member notified: Daughter Patient and family notified of of transfer: 01/24/21  Discharge Plan and Services In-house Referral: Clinical Social Work   Post Acute Care Choice: [place]                               Social Determinants of Health (SDOH) Interventions     Readmission Risk Interventions No flowsheet data found.

## 2021-01-24 NOTE — Progress Notes (Signed)
Mr. Bunch is alert and oriented x2-3. On room air, no issues breathing. Gave report and discharge instructions to [facility] - Primary school teacher at [employer]. She had no further questions. PTAR collected discharge packet. 2 attendants and 3 staff assisted with transferring pt to stretcher. No belongings with patient, patient enroute to facility.

## 2021-01-24 NOTE — Care Management Important Message (Signed)
Important Message  Patient Details  Name: [name] [name] MRN: [id number] Date of Birth: 03/16/1945   Medicare Important Message Given:  Yes  Patient left prior to IM deliver will mailed to the patient home address.    Finnley Larusso 01/24/2021, 2:33 PM

## 2021-01-26 ENCOUNTER — Other Ambulatory Visit: Payer: Self-pay

## 2021-01-26 ENCOUNTER — Encounter (HOSPITAL_BASED_OUTPATIENT_CLINIC_OR_DEPARTMENT_OTHER): Payer: Medicare Other | Attending: Internal Medicine | Admitting: Internal Medicine

## 2021-01-26 DIAGNOSIS — S68411D Complete traumatic amputation of right hand at wrist level, subsequent encounter: Secondary | ICD-10-CM | POA: Diagnosis not present

## 2021-01-26 DIAGNOSIS — L97909 Non-pressure chronic ulcer of unspecified part of unspecified lower leg with unspecified severity: Secondary | ICD-10-CM | POA: Insufficient documentation

## 2021-01-26 DIAGNOSIS — I10 Essential (primary) hypertension: Secondary | ICD-10-CM | POA: Insufficient documentation

## 2021-01-26 DIAGNOSIS — S80829D Blister (nonthermal), unspecified lower leg, subsequent encounter: Secondary | ICD-10-CM | POA: Diagnosis not present

## 2021-01-26 DIAGNOSIS — E1151 Type 2 diabetes mellitus with diabetic peripheral angiopathy without gangrene: Secondary | ICD-10-CM | POA: Insufficient documentation

## 2021-01-26 DIAGNOSIS — L89622 Pressure ulcer of left heel, stage 2: Secondary | ICD-10-CM | POA: Insufficient documentation

## 2021-01-26 DIAGNOSIS — L89016 Pressure-induced deep tissue damage of right elbow: Secondary | ICD-10-CM | POA: Insufficient documentation

## 2021-01-26 DIAGNOSIS — L97518 Non-pressure chronic ulcer of other part of right foot with other specified severity: Secondary | ICD-10-CM | POA: Diagnosis not present

## 2021-01-26 DIAGNOSIS — X58XXXD Exposure to other specified factors, subsequent encounter: Secondary | ICD-10-CM | POA: Diagnosis not present

## 2021-01-26 DIAGNOSIS — G8220 Paraplegia, unspecified: Secondary | ICD-10-CM | POA: Diagnosis not present

## 2021-01-26 LAB — PATHOLOGIST SMEAR REVIEW

## 2021-01-26 NOTE — Progress Notes (Signed)
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 Dellia Nims - ****75 MINUTES -  STRETCHER**** Bathing/ Shower/ Hygiene: May shower and wash wound with soap and water. - wash and gently scrub both legs prior to apply bandage Edema Control - Lymphedema / SCD / Other: Elevate legs to the level of the heart or above for 30 minutes daily and/or when sitting, a frequency of: - throughout the day Off-Loading: Turn and reposition every 2 hours WOUND #23: - Elbow Wound Laterality: Right Cleanser: Wound Cleanser (Generic) Every Other Day/30 Days Discharge Instructions: Cleanse the wound with wound cleanser prior to applying a clean dressing using gauze sponges, not tissue or cotton balls. Prim Dressing: Hydrofera Blue Ready Foam, 2.5 x2.5 in (Generic) Every Other Day/30 Days ary Discharge Instructions: Apply to wound bed as instructed Secondary Dressing: Bordered Gauze, 4x4 in (Generic) Every Other Day/30 Days Discharge Instructions: Apply over primary dressing as directed. WOUND #24: - Lower Leg Wound Laterality: Left, Anterior Cleanser: Soap and Water 1 x Per Day/30 Days Discharge Instructions: May shower and wash wound with dial antibacterial soap and water prior to dressing change. Topical: Triamcinolone 1 x Per Day/30  Days Discharge Instructions: Apply Triamcinolone to periwound and blistered areas Secondary Dressing: ABD Pad, 8x10 (Generic) 1 x Per Day/30 Days Discharge Instructions: Apply over primary dressing as directed. Secured With: [employer], 4.5x3.1 (in/yd) (Generic) 1 x Per Day/30 Days Discharge Instructions: Secure with Kerlix as directed. Secured With: 28M Medipore Public affairs consultant Surgical T 2x10 (in/yd) (Generic) 1 x Per Day/30 Days ape Discharge Instructions: Secure with tape as directed. WOUND #25: - Lower Leg Wound Laterality: Right, Anterior Cleanser: Soap and Water 1 x Per Day/30 Days Discharge Instructions: May shower and wash wound with dial antibacterial soap and water prior to dressing change. Topical: Triamcinolone 1 x Per Day/30  Days Discharge Instructions: Apply Triamcinolone to periwound and blistered areas Secondary Dressing: ABD Pad, 8x10 (Generic) 1 x Per Day/30 Days Discharge Instructions: Apply over primary dressing as directed. Secured With: [employer], 4.5x3.1 (in/yd) (Generic) 1 x Per Day/30 Days Discharge Instructions: Secure with Kerlix as directed. Secured With: 28M [employer] Public affairs consultant Surgical T 2x10 (in/yd) 1 x Per Day/30 Days ape Discharge Instructions: Secure with tape as directed. 1. Pressure ulcer on the right elbow unfortunately unchanged. I am not sure about the degree of pressure relief here. 2. He has pitting edema in the right arm up into the humeral area. No obvious infection or a DVT I suspect this is probably related to tight contractures at the . elbow and shoulder perhaps he is lying on the arm at night. The arm should be elevated on a pillow 3. The condition of the skin on his lower legs is about the same as I am used to seeing extensive large blisters possibly nodules separation of the surface epithelium in his toes. 4. 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 I wonder whether they forward this for direct immunofluorescence 5We continued with the TC ABI due to his lower legs followed by kerlix and Hydrofera Blue to the right elbow. I do not have any good suggestions beyond what dermatology has suggested. I know he had courses of oral steroids which seemed to help for a while. Doxycycline had no effect Electronic Signature(s) Signed: 01/26/2021 4:29:39 PM By: Linton Ham MD Entered By: Linton Ham on 01/26/2021 12:19:31 -------------------------------------------------------------------------------- SuperBill Details Patient Name: Date of Service: Regino Schultze RD R. 01/26/2021 Medical Record Number: [id number] Patient Account Number:  [id number] Date of Birth/Sex: Treating RN: [birth year] (76 y.o. 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 L89.622 Pressure ulcer of left heel, stage 2 L97.518 Non-pressure chronic ulcer of other part of right foot with other specified severity Facility Procedures CPT4 Code: [id number] Description: 80998 - WOUND CARE VISIT-LEV 5 EST PT Modifier: Quantity: 1 Physician Procedures : CPT4 Code Description Modifier [id number] - WC PHYS LEVEL 4 - EST PT ICD-10 Diagnosis Description L89.016 Pressure-induced deep tissue damage of right elbow S80.829D Blister (nonthermal), unspecified lower leg, subsequent encounter L97.909  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

## 2021-01-27 NOTE — Progress Notes (Signed)
JAQUAVIOUS, MERCER ([id number]) Visit Report for 01/26/2021 Arrival Information Details Patient Name: Date of Service: ALYEA, [place] RD R. 01/26/2021 10:45 A M Medical Record Number: [id number] Patient Account Number: [id number] Date of Birth/Sex: Treating RN: [birth year] (76 y.o. [name] Primary Care Gladyse Corvin: Theodora Blow, MIKE Other Clinician: Referring Curly Mackowski: Treating Issac Moure/Extender: Joanne Chars, MIKE Weeks in Treatment: 160 Visit Information History Since Last Visit Added or deleted any medications: No Patient Arrived: Stretcher Any new allergies or adverse reactions: No Arrival Time: 10:46 Had a fall or experienced change in No Accompanied By: EMS activities of daily living that may affect Transfer Assistance: Stretcher risk of falls: Patient Identification Verified: Yes Signs or symptoms of abuse/neglect since last visito No Secondary Verification Process Completed: Yes Hospitalized since last visit: No Patient Requires Transmission-Based Precautions: No Implantable device outside of the clinic excluding No Patient Has Alerts: Yes cellular tissue based products placed in the center Patient Alerts: R ABI non compressible since last visit: Has Dressing in Place as Prescribed: Yes Pain Present Now: No Electronic Signature(s) Signed: 01/26/2021 6:02:23 PM By: Baruch Gouty RN, BSN Entered By: Baruch Gouty on 01/26/2021 10:47:34 -------------------------------------------------------------------------------- Clinic Level of Care Assessment Details Patient Name: Date of Service: [place], [name] 01/26/2021 10:45 A M Medical Record Number: [id number] Patient Account Number: [id number] Date of Birth/Sex: Treating RN: 04/14/1945 (76 y.o. [name] Primary Care Damiean Lukes: Theodora Blow, MIKE Other Clinician: Referring Arlenne Kimbley: Treating Vishruth Seoane/Extender: Joanne Chars, MIKE Weeks in Treatment: 160 Clinic Level of Care Assessment  Items TOOL 4 Quantity Score '[]'  - 0 Use when only an EandM is performed on FOLLOW-UP visit ASSESSMENTS - Nursing Assessment / Reassessment X- 1 10 Reassessment of Co-morbidities (includes updates in patient status) X- 1 5 Reassessment of Adherence to Treatment Plan ASSESSMENTS - Wound and Skin A ssessment / Reassessment '[]'  - 0 Simple Wound Assessment / Reassessment - one wound X- 4 5 Complex Wound Assessment / Reassessment - multiple wounds '[]'  - 0 Dermatologic / Skin Assessment (not related to wound area) ASSESSMENTS - Focused Assessment X- 2 5 Circumferential Edema Measurements - multi extremities '[]'  - 0 Nutritional Assessment / Counseling / Intervention X- 1 5 Lower Extremity Assessment (monofilament, tuning fork, pulses) '[]'  - 0 Peripheral Arterial Disease Assessment (using hand held doppler) ASSESSMENTS - Ostomy and/or Continence Assessment and Care '[]'  - 0 Incontinence Assessment and Management '[]'  - 0 Ostomy Care Assessment and Management (repouching, etc.) PROCESS - Coordination of Care X - Simple Patient / Family Education for ongoing care 1 15 '[]'  - 0 Complex (extensive) Patient / Family Education for ongoing care X- 1 10 Staff obtains Programmer, systems, Records, T Results / Process Orders est X- 1 10 Staff telephones HHA, Nursing Homes / Clarify orders / etc '[]'  - 0 Routine Transfer to another Facility (non-emergent condition) '[]'  - 0 Routine Hospital Admission (non-emergent condition) '[]'  - 0 New Admissions / Biomedical engineer / Ordering NPWT Apligraf, etc. , '[]'  - 0 Emergency Hospital Admission (emergent condition) X- 1 10 Simple Discharge Coordination '[]'  - 0 Complex (extensive) Discharge Coordination PROCESS - Special Needs '[]'  - 0 Pediatric / Minor Patient Management '[]'  - 0 Isolation Patient Management '[]'  - 0 Hearing / Language / Visual special needs '[]'  - 0 Assessment of Community assistance (transportation, D/C planning, etc.) '[]'  - 0 Additional  assistance / Altered mentation '[]'  - 0 Support Surface(s) Assessment (bed, cushion, seat, etc.) INTERVENTIONS - Wound Cleansing / Measurement '[]'  - 0 Simple Wound Cleansing - one  wound X- 4 5 Complex Wound Cleansing - multiple wounds X- 1 5 Wound Imaging (photographs - any number of wounds) '[]'  - 0 Wound Tracing (instead of photographs) '[]'  - 0 Simple Wound Measurement - one wound X- 4 5 Complex Wound Measurement - multiple wounds INTERVENTIONS - Wound Dressings X - Small Wound Dressing one or multiple wounds 1 10 X- 2 15 Medium Wound Dressing one or multiple wounds '[]'  - 0 Large Wound Dressing one or multiple wounds X- 1 5 Application of Medications - topical '[]'  - 0 Application of Medications - injection INTERVENTIONS - Miscellaneous '[]'  - 0 External ear exam '[]'  - 0 Specimen Collection (cultures, biopsies, blood, body fluids, etc.) '[]'  - 0 Specimen(s) / Culture(s) sent or taken to Lab for analysis '[]'  - 0 Patient Transfer (multiple staff / Civil Service fast streamer / Similar devices) '[]'  - 0 Simple Staple / Suture removal (25 or less) '[]'  - 0 Complex Staple / Suture removal (26 or more) '[]'  - 0 Hypo / Hyperglycemic Management (close monitor of Blood Glucose) '[]'  - 0 Ankle / Brachial Index (ABI) - do not check if billed separately X- 1 5 Vital Signs Has the patient been seen at the hospital within the last three years: Yes Total Score: 190 Level Of Care: New/Established - Level 5 Electronic Signature(s) Signed: 01/26/2021 6:02:23 PM By: Baruch Gouty RN, BSN Entered By: Baruch Gouty on 01/26/2021 11:49:28 -------------------------------------------------------------------------------- Encounter Discharge Information Details Patient Name: Date of Service: [name], [name] 01/26/2021 10:45 A M Medical Record Number: [id number] Patient Account Number: [id number] Date of Birth/Sex: Treating RN: Jan 23, 1945 (76 y.o. [name] Primary Care Libni Fusaro: Theodora Blow, MIKE Other  Clinician: Referring Orene Abbasi: Treating Saniyyah Elster/Extender: Joanne Chars, MIKE Weeks in Treatment: 160 Encounter Discharge Information Items Discharge Condition: Stable Ambulatory Status: Stretcher Discharge Destination: Home Transportation: Ambulance Accompanied By: EMS Schedule Follow-up Appointment: Yes Clinical Summary of Care: Patient Declined Electronic Signature(s) Signed: 01/26/2021 6:02:23 PM By: Baruch Gouty RN, BSN Entered By: Baruch Gouty on 01/26/2021 17:05:52 -------------------------------------------------------------------------------- Lower Extremity Assessment Details Patient Name: Date of Service: [name], [name] 01/26/2021 10:45 A M Medical Record Number: [id number] Patient Account Number: [id number] Date of Birth/Sex: Treating RN: 09/09/1945 (76 y.o. [name] Primary Care Vicki Chaffin: Theodora Blow, MIKE Other Clinician: Referring Benjerman Molinelli: Treating Jahmya Onofrio/Extender: Joanne Chars, MIKE Weeks in Treatment: 160 Edema Assessment Assessed: [Left: No] [Right: No] [Left: Edema] [Right: :] Calf Left: Right: Point of Measurement: From Medial Instep 41.5 cm 43.5 cm Ankle Left: Right: Point of Measurement: From Medial Instep 21 cm 25 cm Vascular Assessment Pulses: Dorsalis Pedis Palpable: [Left:No] [Right:No] Electronic Signature(s) Signed: 01/26/2021 6:02:23 PM By: Baruch Gouty RN, BSN Entered By: Baruch Gouty on 01/26/2021 11:25:37 -------------------------------------------------------------------------------- Multi Wound Chart Details Patient Name: Date of Service: Regino Schultze RD R. 01/26/2021 10:45 A M Medical Record Number: [id number] Patient Account Number: [id number] Date of Birth/Sex: Treating RN: [birth year] (76 y.o. [name], [name] Primary Care Jakaden Ouzts: Theodora Blow, MIKE Other Clinician: Referring Ilse Billman: Treating Gradie Butrick/Extender: Joanne Chars, MIKE Weeks in Treatment: 160 Vital  Signs Height(in): 56 Pulse(bpm): 91 Weight(lbs): 130 Blood Pressure(mmHg): 109/66 Body Mass Index(BMI): 20 Temperature(F): 98.4 Respiratory Rate(breaths/min): 18 Photos: Right Elbow Left, Anterior Lower Leg Right, Anterior Lower Leg Wound Location: Pressure Injury Gradually Appeared Gradually Appeared Wounding Event: Pressure Ulcer Lymphedema Lymphedema Primary Etiology: Anemia, Hypertension, Type II Anemia, Hypertension, Type II Anemia, Hypertension, Type II Comorbid History: Diabetes, Paraplegia Diabetes, Paraplegia Diabetes, Paraplegia 05/26/2020 09/14/2020 09/14/2020 Date Acquired: 37 19 37  Weeks of Treatment: Open Open Open Wound Status: 3.6x3.5x0.2 17x10.5x0.1 21x16x0.1 Measurements L x W x D (cm) 9.896 140.194 263.894 A (cm) : rea 1.979 14.019 26.389 Volume (cm) : -27.30% 64.60% 36.60% % Reduction in Area: 74.50% 64.60% 36.60% % 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 Full Thickness Without Exposed Full Thickness Without Exposed Classification: Support Structures Support Structures Medium Large Large Exudate Amount: Serosanguineous Serous Serous Exudate Type: red, brown amber amber Exudate Color: Well defined, not attached Flat and Intact Flat and Intact Wound Margin: Medium (34-66%) Medium (34-66%) Medium (34-66%) Granulation Amount: Red Pink Pink Granulation Quality: Medium (34-66%) None Present (0%) None Present (0%) Necrotic Amount: Fat Layer (Subcutaneous Tissue): Yes Fat Layer (Subcutaneous Tissue): Yes Fat Layer (Subcutaneous Tissue): Yes Exposed Structures: Fascia: No Fascia: No Fascia: No Tendon: No Tendon: No Tendon: No Muscle: No Muscle: No Muscle: No Joint: No Joint: No Joint: No Bone: No Bone: No Bone: No Small (1-33%) Large (67-100%) Medium (34-66%) Epithelialization: Wound Number: 28 N/A N/A Photos: N/A N/A Right T Great oe N/A N/A Wound Location: Pressure Injury N/A N/A Wounding Event: Pressure Ulcer N/A N/A Primary  Etiology: Anemia, Hypertension, Type II N/A N/A Comorbid History: Diabetes, Paraplegia 12/27/2020 N/A N/A Date Acquired: 4 N/A N/A Weeks of Treatment: Healed - Epithelialized N/A N/A Wound Status: 0x0x0 N/A N/A Measurements L x W x D (cm) 0 N/A N/A A (cm) : rea 0 N/A N/A Volume (cm) : 100.00% N/A N/A % Reduction in A rea: 100.00% N/A N/A % Reduction in Volume: Category/Stage II N/A N/A Classification: Small N/A N/A Exudate A mount: Serosanguineous N/A N/A Exudate Type: red, brown N/A N/A Exudate Color: Flat and Intact N/A N/A Wound Margin: Small (1-33%) N/A N/A Granulation A mount: Pink N/A N/A Granulation Quality: None Present (0%) N/A N/A Necrotic A mount: Fat Layer (Subcutaneous Tissue): Yes N/A N/A Exposed Structures: Fascia: No Tendon: No Muscle: No Joint: No Bone: No Large (67-100%) N/A N/A Epithelialization: Treatment Notes Electronic Signature(s) Signed: 01/26/2021 4:29:39 PM By: Linton Ham MD Signed: 01/27/2021 1:40:06 PM By: Deon Pilling RN, BSN Entered By: Linton Ham on 01/26/2021 12:01:10 -------------------------------------------------------------------------------- Multi-Disciplinary Care Plan Details Patient Name: Date of Service: [name], [name] 01/26/2021 10:45 A M Medical Record Number: [id number] Patient Account Number: [id number] Date of Birth/Sex: Treating RN: 02-Aug-1945 (76 y.o. [name] Primary Care Jera Headings: Theodora Blow, MIKE Other Clinician: Referring Tukker Byrns: Treating Nickolai Rinks/Extender: Joanne Chars, MIKE Weeks in Treatment: Aurelia reviewed with physician Active Inactive Wound/Skin Impairment Nursing Diagnoses: Knowledge deficit related to ulceration/compromised skin integrity Goals: Patient/caregiver will verbalize understanding of skin care regimen Date Initiated: 05/20/2019 Target Resolution Date: 02/24/2021 Goal Status: Active Ulcer/skin breakdown will have a  volume reduction of 30% by week 4 Date Initiated: 12/31/2017 Date Inactivated: 05/20/2019 Target Resolution Date: 06/18/2018 Goal Status: Met Interventions: Assess patient/caregiver ability to obtain necessary supplies Assess patient/caregiver ability to perform ulcer/skin care regimen upon admission and as needed Assess ulceration(s) every visit Notes: Electronic Signature(s) Signed: 01/26/2021 6:02:23 PM By: Baruch Gouty RN, BSN Entered By: Baruch Gouty on 01/26/2021 11:28:30 -------------------------------------------------------------------------------- Pain Assessment Details Patient Name: Date of Service: Regino Schultze RD R. 01/26/2021 10:45 A M Medical Record Number: [id number] Patient Account Number: [id number] Date of Birth/Sex: Treating RN: 06-15-1945 (76 y.o. [name] Primary Care Larico Dimock: Theodora Blow, MIKE Other Clinician: Referring Hiyab Nhem: Treating Monchel Pollitt/Extender: Joanne Chars, MIKE Weeks in Treatment: 160 Active Problems Location of Pain Severity and Description of Pain Patient Has Paino No Site Locations  With Dressing Change: Yes Duration of the Pain. Constant / Intermittento Intermittent Rate the pain. Current Pain Level: 0 Worst Pain Level: 6 Least Pain Level: 0 Character of Pain Describe the Pain: Tender Pain Management and Medication Current Pain Management: Is the Current Pain Management Adequate: Adequate How does your wound impact your activities of daily livingo Sleep: No Bathing: No Appetite: No Relationship With Others: No Bladder Continence: No Emotions: No Bowel Continence: No Work: No Toileting: No Drive: No Dressing: No Hobbies: No Electronic Signature(s) Signed: 01/26/2021 6:02:23 PM By: Baruch Gouty RN, BSN Entered By: Baruch Gouty on 01/26/2021 10:48:59 -------------------------------------------------------------------------------- Patient/Caregiver Education Details Patient Name: Date of  Service: Regino Schultze RD R. 1/12/2023andnbsp10:45 [place] Record Number: [id number] Patient Account Number: [id number] Date of Birth/Gender: Treating RN: Mar 21, 1945 (76 y.o. [name] Primary Care Physician: Theodora Blow, MIKE Other Clinician: Referring Physician: Treating Physician/Extender: Joanne Chars, MIKE Weeks in Treatment: 160 Education Assessment Education Provided To: Patient Education Topics Provided Wound/Skin Impairment: Methods: Explain/Verbal Responses: Reinforcements needed, State content correctly Electronic Signature(s) Signed: 01/26/2021 6:02:23 PM By: Baruch Gouty RN, BSN Entered By: Baruch Gouty on 01/26/2021 11:29:49 -------------------------------------------------------------------------------- Wound Assessment Details Patient Name: Date of Service: Regino Schultze RD R. 01/26/2021 10:45 A M Medical Record Number: [id number] Patient Account Number: [id number] Date of Birth/Sex: Treating RN: [birth year] (76 y.o. [name] Primary Care Daniah Zaldivar: Theodora Blow, MIKE Other Clinician: Referring Axle Parfait: Treating Darryl Blumenstein/Extender: Joanne Chars, MIKE Weeks in Treatment: 160 Wound Status Wound Number: 23 Primary Etiology: Pressure Ulcer Wound Location: Right Elbow Wound Status: Open Wounding Event: Pressure Injury Comorbid History: Anemia, Hypertension, Type II Diabetes, Paraplegia Date Acquired: 05/26/2020 Weeks Of Treatment: 35 Clustered Wound: No Photos Wound Measurements Length: (cm) 3.6 Width: (cm) 3.5 Depth: (cm) 0.2 Area: (cm) 9.896 Volume: (cm) 1.979 % Reduction in Area: -27.3% % Reduction in Volume: 74.5% Epithelialization: Small (1-33%) Tunneling: No Undermining: No Wound Description Classification: Category/Stage IV Wound Margin: Well defined, not attached Exudate Amount: Medium Exudate Type: Serosanguineous Exudate Color: red, brown Foul Odor After Cleansing: No Slough/Fibrino Yes Wound  Bed Granulation Amount: Medium (34-66%) Exposed Structure Granulation Quality: Red Fascia Exposed: No Necrotic Amount: Medium (34-66%) Fat Layer (Subcutaneous Tissue) Exposed: Yes Necrotic Quality: Adherent Slough Tendon Exposed: No Muscle Exposed: No Joint Exposed: No Bone Exposed: No Treatment Notes Wound #23 (Elbow) Wound Laterality: Right Cleanser Wound Cleanser Discharge Instruction: Cleanse the wound with wound cleanser prior to applying a clean dressing using gauze sponges, not tissue or cotton balls. Peri-Wound Care Topical Primary Dressing Hydrofera Blue Ready Foam, 2.5 x2.5 in Discharge Instruction: Apply to wound bed as instructed Secondary Dressing Bordered Gauze, 4x4 in Discharge Instruction: Apply over primary dressing as directed. Secured With Compression Wrap Compression Stockings Environmental education officer) Signed: 01/26/2021 6:02:23 PM By: Baruch Gouty RN, BSN Entered By: Baruch Gouty on 01/26/2021 11:18:44 -------------------------------------------------------------------------------- Wound Assessment Details Patient Name: Date of Service: [name], [name] 01/26/2021 10:45 A M Medical Record Number: [id number] Patient Account Number: [id number] Date of Birth/Sex: Treating RN: Nov 27, 1945 (76 y.o. [name] Primary Care Adriaan Maltese: Theodora Blow, MIKE Other Clinician: Referring Byanka Landrus: Treating Shawan Tosh/Extender: Joanne Chars, MIKE Weeks in Treatment: 160 Wound Status Wound Number: 24 Primary Etiology: Lymphedema Wound Location: Left, Anterior Lower Leg Wound Status: Open Wounding Event: Gradually Appeared Comorbid History: Anemia, Hypertension, Type II Diabetes, Paraplegia Date Acquired: 09/14/2020 Weeks Of Treatment: 19 Clustered Wound: No Photos Wound Measurements Length: (cm) 17 Width: (cm) 10.5 Depth: (cm) 0.1 Area: (cm) 140.194  Volume: (cm) 14.019 % Reduction in Area: 64.6% % Reduction in Volume:  64.6% Epithelialization: Large (67-100%) Tunneling: No Undermining: No Wound Description Classification: Full Thickness Without Exposed Support Structures Wound Margin: Flat and Intact Exudate Amount: Large Exudate Type: Serous Exudate Color: amber Foul Odor After Cleansing: No Slough/Fibrino No Wound Bed Granulation Amount: Medium (34-66%) Exposed Structure Granulation Quality: Pink Fascia Exposed: No Necrotic Amount: None Present (0%) Fat Layer (Subcutaneous Tissue) Exposed: Yes Tendon Exposed: No Muscle Exposed: No Joint Exposed: No Bone Exposed: No Treatment Notes Wound #24 (Lower Leg) Wound Laterality: Left, Anterior Cleanser Soap and Water Discharge Instruction: May shower and wash wound with dial antibacterial soap and water prior to dressing change. Peri-Wound Care Topical Triamcinolone Discharge Instruction: Apply Triamcinolone to periwound and blistered areas Primary Dressing Secondary Dressing ABD Pad, 8x10 Discharge Instruction: Apply over primary dressing as directed. Secured With [employer], 4.5x3.1 (in/yd) Discharge Instruction: Secure with Kerlix as directed. 69M Medipore Soft Cloth Surgical T 2x10 (in/yd) ape Discharge Instruction: Secure with tape as directed. Compression Wrap Compression Stockings Add-Ons Electronic Signature(s) Signed: 01/26/2021 6:02:23 PM By: Baruch Gouty RN, BSN Entered By: Baruch Gouty on 01/26/2021 11:20:25 -------------------------------------------------------------------------------- Wound Assessment Details Patient Name: Date of Service: [name], [name] 01/26/2021 10:45 A M Medical Record Number: [id number] Patient Account Number: [id number] Date of Birth/Sex: Treating RN: November 07, 1945 (76 y.o. [name] Primary Care Kaylum Shrum: Theodora Blow, MIKE Other Clinician: Referring Kedrick Mcnamee: Treating Magon Croson/Extender: Joanne Chars, MIKE Weeks in Treatment: 160 Wound Status Wound Number:  25 Primary Etiology: Lymphedema Wound Location: Right, Anterior Lower Leg Wound Status: Open Wounding Event: Gradually Appeared Comorbid History: Anemia, Hypertension, Type II Diabetes, Paraplegia Date Acquired: 09/14/2020 Weeks Of Treatment: 19 Clustered Wound: No Photos Wound Measurements Length: (cm) 21 Width: (cm) 16 Depth: (cm) 0.1 Area: (cm) 263.894 Volume: (cm) 26.389 % Reduction in Area: 36.6% % Reduction in Volume: 36.6% Epithelialization: Medium (34-66%) Tunneling: No Undermining: No Wound Description Classification: Full Thickness Without Exposed Support Structures Wound Margin: Flat and Intact Exudate Amount: Large Exudate Type: Serous Exudate Color: amber Foul Odor After Cleansing: No Slough/Fibrino No Wound Bed Granulation Amount: Medium (34-66%) Exposed Structure Granulation Quality: Pink Fascia Exposed: No Necrotic Amount: None Present (0%) Fat Layer (Subcutaneous Tissue) Exposed: Yes Tendon Exposed: No Muscle Exposed: No Joint Exposed: No Bone Exposed: No Treatment Notes Wound #25 (Lower Leg) Wound Laterality: Right, Anterior Cleanser Soap and Water Discharge Instruction: May shower and wash wound with dial antibacterial soap and water prior to dressing change. Peri-Wound Care Topical Triamcinolone Discharge Instruction: Apply Triamcinolone to periwound and blistered areas Primary Dressing Secondary Dressing ABD Pad, 8x10 Discharge Instruction: Apply over primary dressing as directed. Secured With [employer], 4.5x3.1 (in/yd) Discharge Instruction: Secure with Kerlix as directed. 69M Medipore Soft Cloth Surgical T 2x10 (in/yd) ape Discharge Instruction: Secure with tape as directed. Compression Wrap Compression Stockings Add-Ons Electronic Signature(s) Signed: 01/26/2021 6:02:23 PM By: Baruch Gouty RN, BSN Entered By: Baruch Gouty on 01/26/2021  11:21:31 -------------------------------------------------------------------------------- Wound Assessment Details Patient Name: Date of Service: [name], [name] 01/26/2021 10:45 A M Medical Record Number: [id number] Patient Account Number: [id number] Date of Birth/Sex: Treating RN: 06-25-1945 (76 y.o. [name] Primary Care Bufford Helms: Theodora Blow, MIKE Other Clinician: Referring Alessia Gonsalez: Treating Dawnetta Copenhaver/Extender: Joanne Chars, MIKE Weeks in Treatment: 160 Wound Status Wound Number: 28 Primary Etiology: Pressure Ulcer Wound Location: Right T Great oe Wound Status: Healed - Epithelialized Wounding Event: Pressure Injury Comorbid History: Anemia, Hypertension, Type II Diabetes,  Paraplegia Date Acquired: 12/27/2020 Weeks Of Treatment: 4 Clustered Wound: No Photos Wound Measurements Length: (cm) Width: (cm) Depth: (cm) Area: (cm) Volume: (cm) 0 % Reduction in Area: 100% 0 % Reduction in Volume: 100% 0 Epithelialization: Large (67-100%) 0 Tunneling: No 0 Undermining: No Wound Description Classification: Category/Stage II Wound Margin: Flat and Intact Exudate Amount: Small Exudate Type: Serosanguineous Exudate Color: red, brown Foul Odor After Cleansing: No Slough/Fibrino No Wound Bed Granulation Amount: Small (1-33%) Exposed Structure Granulation Quality: Pink Fascia Exposed: No Necrotic Amount: None Present (0%) Fat Layer (Subcutaneous Tissue) Exposed: Yes Tendon Exposed: No Muscle Exposed: No Joint Exposed: No Bone Exposed: No Treatment Notes Wound #28 (Toe Great) Wound Laterality: Right Cleanser Peri-Wound Care Topical Primary Dressing Secondary Dressing Secured With Compression Wrap Compression Stockings Add-Ons Electronic Signature(s) Signed: 01/26/2021 6:02:23 PM By: Baruch Gouty RN, BSN Entered By: Baruch Gouty on 01/26/2021  11:31:50 -------------------------------------------------------------------------------- Vitals Details Patient Name: Date of Service: [name], [name] 01/26/2021 10:45 A M Medical Record Number: [id number] Patient Account Number: [id number] Date of Birth/Sex: Treating RN: 1945/02/12 (76 y.o. [name] Primary Care Tony Friscia: Theodora Blow, MIKE Other Clinician: Referring Liseth Wann: Treating Seng Larch/Extender: Joanne Chars, MIKE Weeks in Treatment: 160 Vital Signs Time Taken: 10:47 Temperature (F): 98.4 Height (in): 67 Pulse (bpm): 91 Source: Stated Respiratory Rate (breaths/min): 18 Weight (lbs): 130 Blood Pressure (mmHg): 109/66 Source: Stated Reference Range: 80 - 120 mg / dl Body Mass Index (BMI): 20.4 Electronic Signature(s) Signed: 01/26/2021 6:02:23 PM By: Baruch Gouty RN, BSN Entered By: Baruch Gouty on 01/26/2021 10:48:18

## 2021-02-03 ENCOUNTER — Emergency Department (HOSPITAL_COMMUNITY): Payer: Medicare Other

## 2021-02-03 ENCOUNTER — Inpatient Hospital Stay (HOSPITAL_COMMUNITY)
Admission: EM | Admit: 2021-02-03 | Discharge: 2021-02-09 | DRG: 641 | Disposition: A | Discharge status: Skilled Nursing Facility | Payer: Medicare Other | Source: Skilled Nursing Facility | Attending: Internal Medicine | Admitting: Internal Medicine

## 2021-02-03 DIAGNOSIS — E785 Hyperlipidemia, unspecified: Secondary | ICD-10-CM | POA: Diagnosis present

## 2021-02-03 DIAGNOSIS — E875 Hyperkalemia: Principal | ICD-10-CM | POA: Diagnosis present

## 2021-02-03 DIAGNOSIS — R253 Fasciculation: Secondary | ICD-10-CM | POA: Diagnosis present

## 2021-02-03 DIAGNOSIS — I4510 Unspecified right bundle-branch block: Secondary | ICD-10-CM | POA: Diagnosis present

## 2021-02-03 DIAGNOSIS — Z87891 Personal history of nicotine dependence: Secondary | ICD-10-CM

## 2021-02-03 DIAGNOSIS — L89020 Pressure ulcer of left elbow, unstageable: Secondary | ICD-10-CM | POA: Diagnosis present

## 2021-02-03 DIAGNOSIS — R569 Unspecified convulsions: Secondary | ICD-10-CM | POA: Diagnosis present

## 2021-02-03 DIAGNOSIS — N184 Chronic kidney disease, stage 4 (severe): Secondary | ICD-10-CM | POA: Diagnosis present

## 2021-02-03 DIAGNOSIS — Z89111 Acquired absence of right hand: Secondary | ICD-10-CM

## 2021-02-03 DIAGNOSIS — L89152 Pressure ulcer of sacral region, stage 2: Secondary | ICD-10-CM | POA: Diagnosis present

## 2021-02-03 DIAGNOSIS — E1151 Type 2 diabetes mellitus with diabetic peripheral angiopathy without gangrene: Secondary | ICD-10-CM | POA: Diagnosis present

## 2021-02-03 DIAGNOSIS — I9589 Other hypotension: Secondary | ICD-10-CM | POA: Diagnosis present

## 2021-02-03 DIAGNOSIS — Z833 Family history of diabetes mellitus: Secondary | ICD-10-CM

## 2021-02-03 DIAGNOSIS — Z888 Allergy status to other drugs, medicaments and biological substances status: Secondary | ICD-10-CM

## 2021-02-03 DIAGNOSIS — Z20822 Contact with and (suspected) exposure to covid-19: Secondary | ICD-10-CM | POA: Diagnosis present

## 2021-02-03 DIAGNOSIS — I129 Hypertensive chronic kidney disease with stage 1 through stage 4 chronic kidney disease, or unspecified chronic kidney disease: Secondary | ICD-10-CM | POA: Diagnosis present

## 2021-02-03 DIAGNOSIS — N2589 Other disorders resulting from impaired renal tubular function: Secondary | ICD-10-CM | POA: Diagnosis present

## 2021-02-03 DIAGNOSIS — R29818 Other symptoms and signs involving the nervous system: Secondary | ICD-10-CM | POA: Diagnosis present

## 2021-02-03 DIAGNOSIS — D631 Anemia in chronic kidney disease: Secondary | ICD-10-CM | POA: Diagnosis present

## 2021-02-03 DIAGNOSIS — T07XXXA Unspecified multiple injuries, initial encounter: Secondary | ICD-10-CM | POA: Diagnosis present

## 2021-02-03 DIAGNOSIS — Z7984 Long term (current) use of oral hypoglycemic drugs: Secondary | ICD-10-CM

## 2021-02-03 DIAGNOSIS — I872 Venous insufficiency (chronic) (peripheral): Secondary | ICD-10-CM | POA: Diagnosis present

## 2021-02-03 DIAGNOSIS — Z7982 Long term (current) use of aspirin: Secondary | ICD-10-CM

## 2021-02-03 DIAGNOSIS — Z79899 Other long term (current) drug therapy: Secondary | ICD-10-CM

## 2021-02-03 DIAGNOSIS — R4189 Other symptoms and signs involving cognitive functions and awareness: Secondary | ICD-10-CM | POA: Diagnosis present

## 2021-02-03 DIAGNOSIS — E1122 Type 2 diabetes mellitus with diabetic chronic kidney disease: Secondary | ICD-10-CM | POA: Diagnosis present

## 2021-02-03 DIAGNOSIS — Z8673 Personal history of transient ischemic attack (TIA), and cerebral infarction without residual deficits: Secondary | ICD-10-CM

## 2021-02-03 DIAGNOSIS — D509 Iron deficiency anemia, unspecified: Secondary | ICD-10-CM | POA: Diagnosis present

## 2021-02-03 DIAGNOSIS — G8220 Paraplegia, unspecified: Secondary | ICD-10-CM | POA: Diagnosis present

## 2021-02-03 DIAGNOSIS — K219 Gastro-esophageal reflux disease without esophagitis: Secondary | ICD-10-CM | POA: Diagnosis present

## 2021-02-03 DIAGNOSIS — E872 Acidosis, unspecified: Secondary | ICD-10-CM | POA: Diagnosis present

## 2021-02-03 LAB — CBG MONITORING, ED: Glucose-Capillary: 124 mg/dL — ABNORMAL HIGH (ref 70–99)

## 2021-02-03 LAB — COMPREHENSIVE METABOLIC PANEL
ALT: 17 U/L (ref 0–44)
AST: 26 U/L (ref 15–41)
Albumin: 2.6 g/dL — ABNORMAL LOW (ref 3.5–5.0)
Alkaline Phosphatase: 78 U/L (ref 38–126)
Anion gap: 6 (ref 5–15)
BUN: 23 mg/dL (ref 8–23)
CO2: 21 mmol/L — ABNORMAL LOW (ref 22–32)
Calcium: 8.3 mg/dL — ABNORMAL LOW (ref 8.9–10.3)
Chloride: 112 mmol/L — ABNORMAL HIGH (ref 98–111)
Creatinine, Ser: 1.11 mg/dL (ref 0.61–1.24)
GFR, Estimated: >60 mL/min (ref >60)
Glucose, Bld: 125 mg/dL — ABNORMAL HIGH (ref 70–99)
Potassium: 6.3 mmol/L (ref 3.5–5.1)
Sodium: 139 mmol/L (ref 135–145)
Total Bilirubin: 0.7 mg/dL (ref 0.3–1.2)
Total Protein: 6.5 g/dL (ref 6.5–8.1)

## 2021-02-03 LAB — MAGNESIUM: Magnesium: 1.5 mg/dL — ABNORMAL LOW (ref 1.7–2.4)

## 2021-02-03 IMAGING — CT CT HEAD W/O CM
3 series · 14 of 47 positions shown, 16 images · non-contrast
Comparison: CT head [DATE].  MRI brain fall [DATE].

CLINICAL DATA: Seizure.



[Series 4: head 5.0 mpr ax · axial · 0.37mm/px · z∈[+878,+1012]mm · 8 of 34 slices shown, 10 images]
[im 3/34  brain]
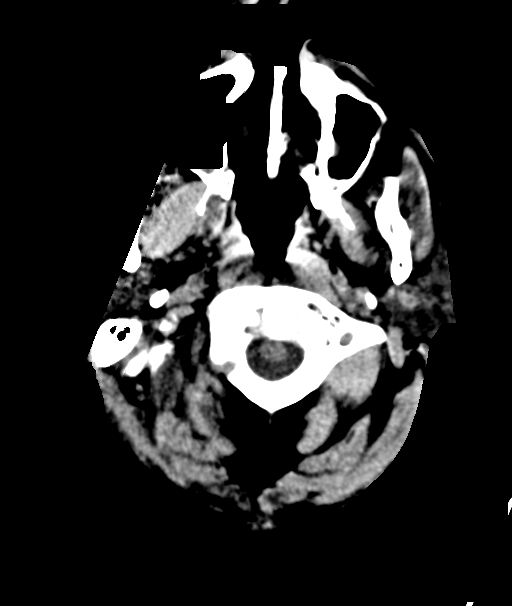
[im 3/34  bone]
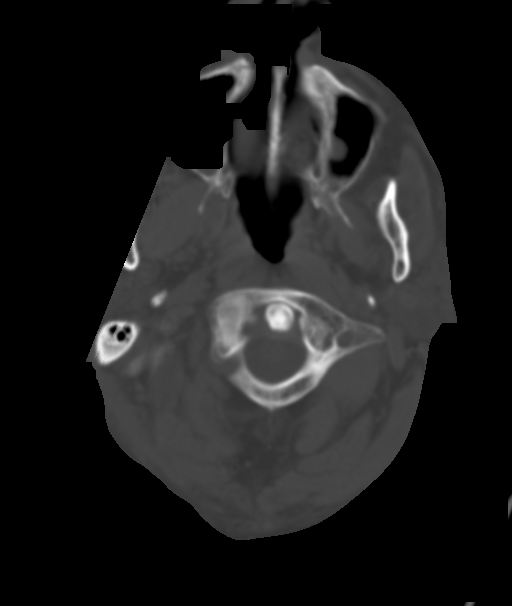
[im 7/34  brain]
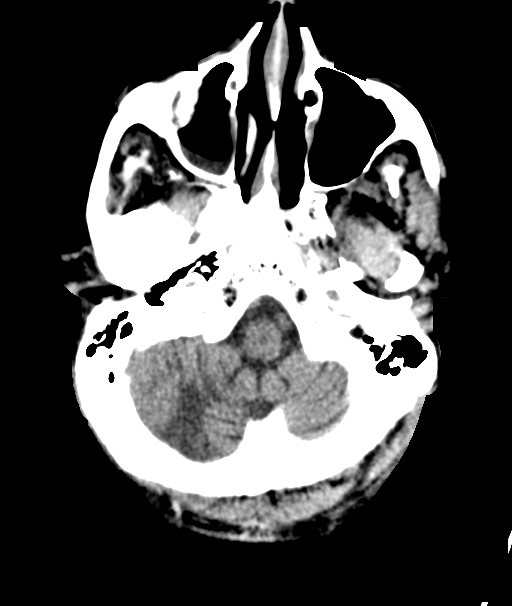
[im 11/34  brain]
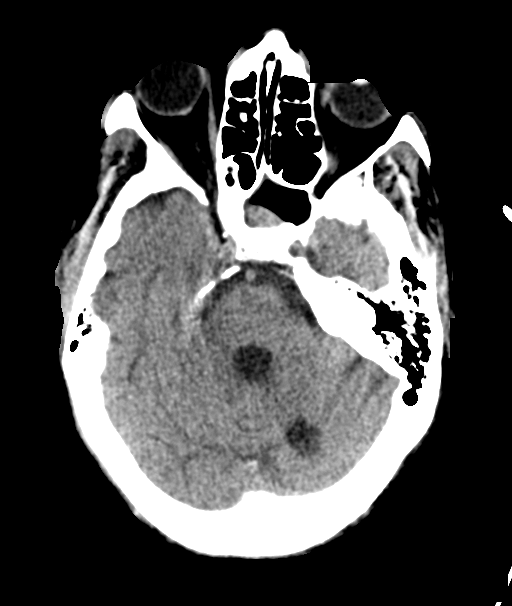
[im 15/34  brain]
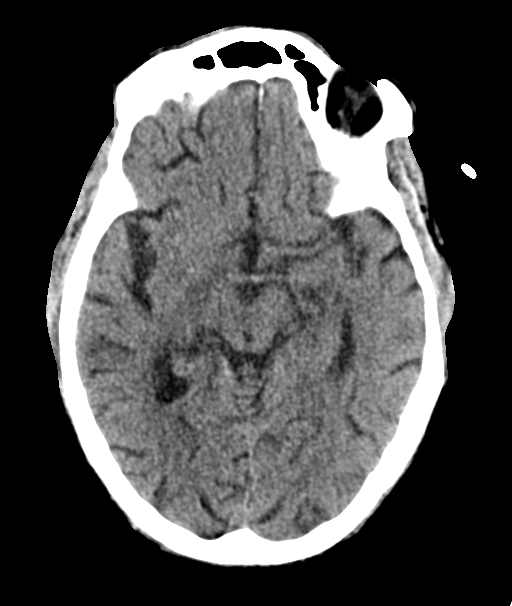
[im 19/34  brain]
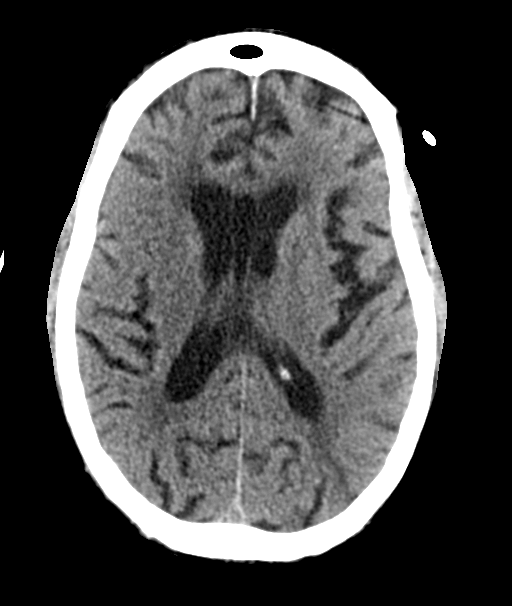
[im 19/34  bone]
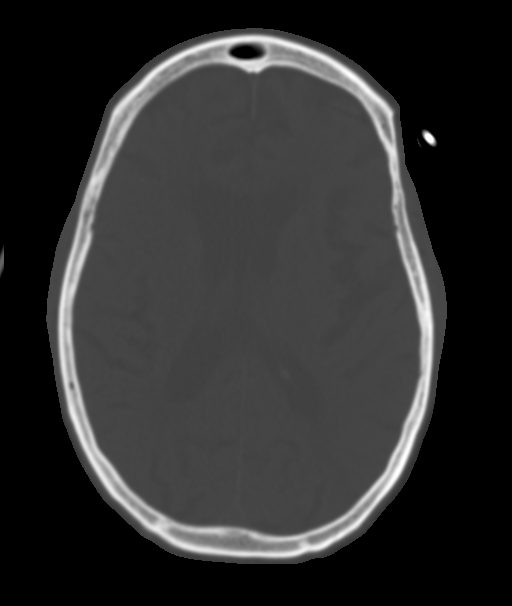
[im 23/34  brain]
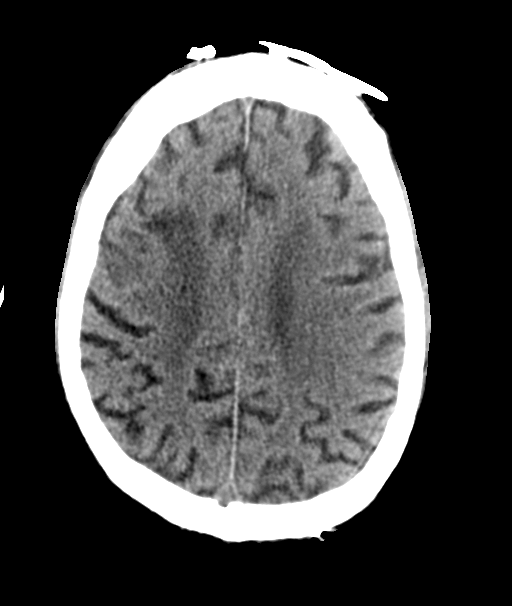
[im 27/34  brain]
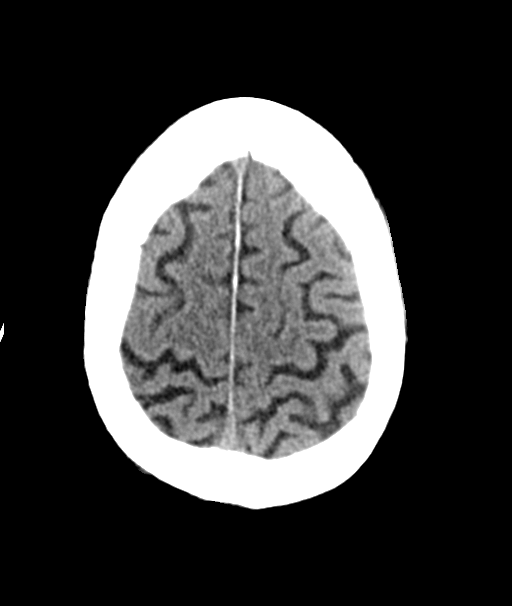
[im 31/34  brain]
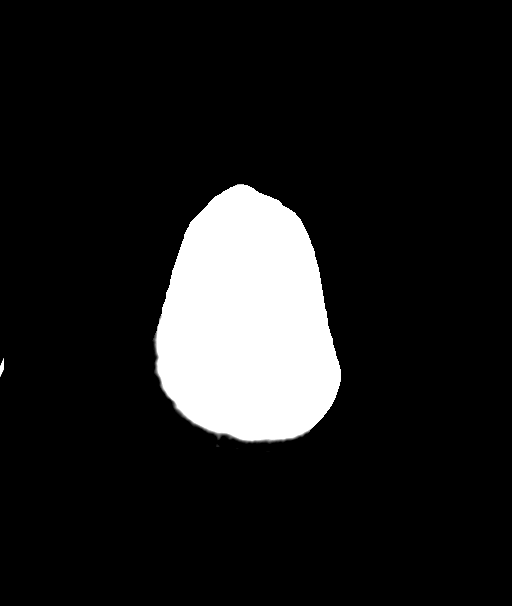

[Series 6: head 3.0 mpr cor · coronal · 0.32mm/px · 3 of 75 slices shown]
[im 25/75  brain]
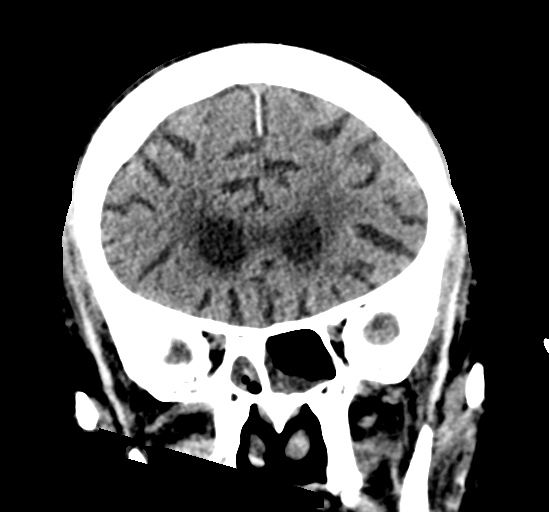
[im 33/75  brain]
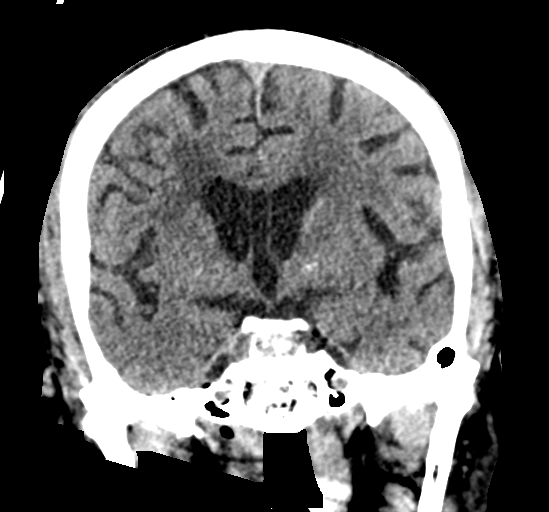
[im 42/75  brain]
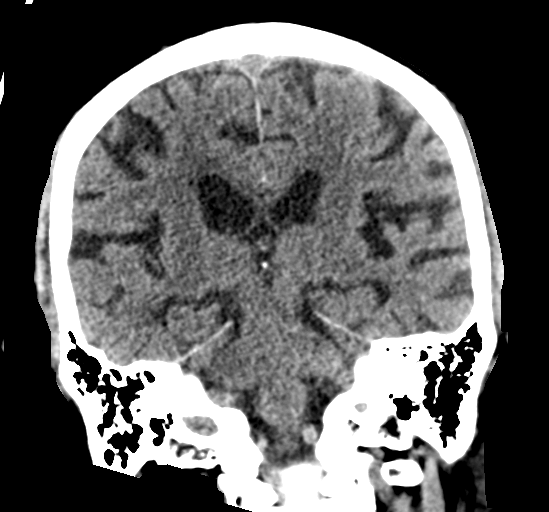

[Series 7: head 3.0 mpr sag · sagittal · 0.32mm/px · 3 of 59 slices shown]
[im 26/59  brain]
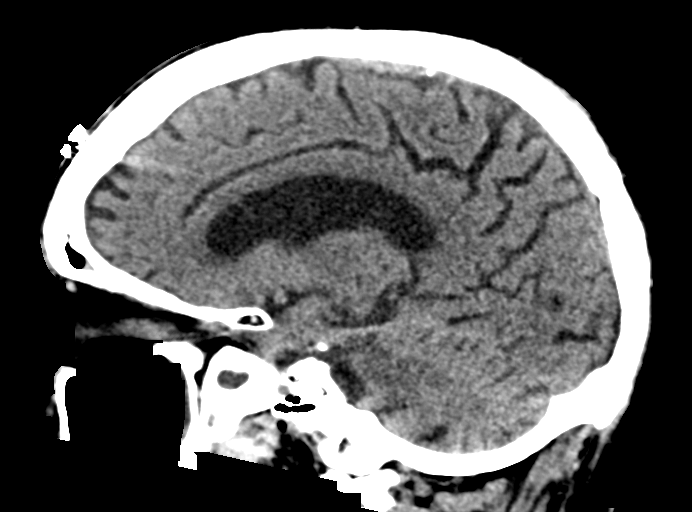
[im 30/59  brain]
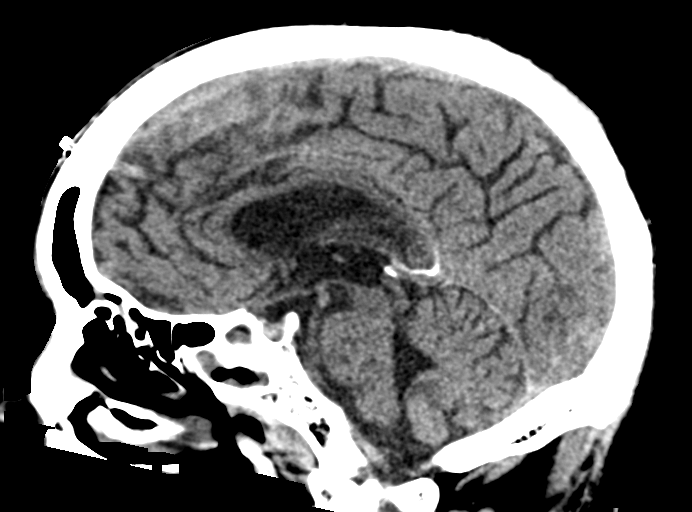
[im 33/59  brain]
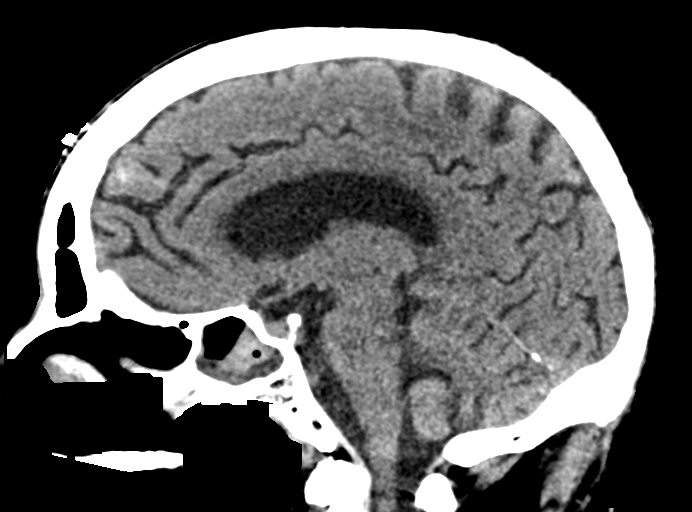

[14 of 47 positions shown; findings below may reference images not displayed]

FINDINGS: Brain: No evidence of acute infarction, hemorrhage, hydrocephalus,
extra-axial collection or mass lesion/mass effect. Again seen is
mild diffuse atrophy and mild chronic small vessel ischemic change
in the periventricular white matter. Old infarct in the deep white
matter of the right frontal lobe and bilateral cerebellum appear
unchanged from the prior study.

Vascular: No hyperdense vessel or unexpected calcification.

Skull: Normal. Negative for fracture or focal lesion.

Sinuses/Orbits: There are air-fluid levels in the sphenoid sinuses,
right frontal, and right maxillary sinuses. This is new from prior.
The mastoid air cells appear clear.

Other: None.
IMPRESSION: 1.  No acute intracranial abnormality.

2.  Stable diffuse atrophy and chronic ischemic changes.

3.  New acute right frontal, maxillary and sphenoid sinusitis.

## 2021-02-03 NOTE — ED Notes (Signed)
MD notified of potassium

## 2021-02-03 NOTE — ED Triage Notes (Signed)
Pt BIB EMS from [place]. Per EMS pt daughter was visiting today and noticed pt was having twitching to his left side. Per EMS pt is having twitching but remains alert and talking while doing so. Spasms listed in pt history but Baclofen started today and [facility] unable to confirm any hx to EMS. Pt has hx of stroke, R hand amputation, and paralyzed from the waist down.

## 2021-02-03 NOTE — ED Notes (Signed)
MD Bero at bedside

## 2021-02-03 NOTE — ED Notes (Signed)
Pt to CT

## 2021-02-03 NOTE — ED Notes (Signed)
Pt having frequent episodes of twitching/spasms - effecting face and left side of body - MD notified - pt remains alert and able to talk during episodes

## 2021-02-04 ENCOUNTER — Observation Stay (HOSPITAL_COMMUNITY)
Admit: 2021-02-04 | Discharge: 2021-02-04 | Disposition: A | Discharge status: Home / Self Care | Payer: Medicare Other | Attending: Neurology | Admitting: Neurology

## 2021-02-04 ENCOUNTER — Observation Stay (HOSPITAL_COMMUNITY): Payer: Medicare Other

## 2021-02-04 DIAGNOSIS — T07XXXA Unspecified multiple injuries, initial encounter: Secondary | ICD-10-CM

## 2021-02-04 DIAGNOSIS — G8220 Paraplegia, unspecified: Secondary | ICD-10-CM

## 2021-02-04 DIAGNOSIS — R4189 Other symptoms and signs involving cognitive functions and awareness: Secondary | ICD-10-CM

## 2021-02-04 DIAGNOSIS — N2589 Other disorders resulting from impaired renal tubular function: Secondary | ICD-10-CM | POA: Diagnosis present

## 2021-02-04 DIAGNOSIS — R29818 Other symptoms and signs involving the nervous system: Secondary | ICD-10-CM | POA: Diagnosis not present

## 2021-02-04 DIAGNOSIS — E875 Hyperkalemia: Principal | ICD-10-CM

## 2021-02-04 LAB — CBG MONITORING, ED
Glucose-Capillary: 91 mg/dL (ref 70–99)
Glucose-Capillary: 134 mg/dL — ABNORMAL HIGH (ref 70–99)

## 2021-02-04 LAB — POTASSIUM
Potassium: 5.7 mmol/L — ABNORMAL HIGH (ref 3.5–5.1)
Potassium: 5.9 mmol/L — ABNORMAL HIGH (ref 3.5–5.1)
Potassium: 6.1 mmol/L — ABNORMAL HIGH (ref 3.5–5.1)

## 2021-02-04 LAB — URINALYSIS, ROUTINE W REFLEX MICROSCOPIC
Bacteria, UA: NONE SEEN
Bilirubin Urine: NEGATIVE
Glucose, UA: NEGATIVE mg/dL
Hgb urine dipstick: NEGATIVE
Ketones, ur: NEGATIVE mg/dL
Leukocytes,Ua: NEGATIVE
Nitrite: NEGATIVE
Protein, ur: NEGATIVE mg/dL
Specific Gravity, Urine: 1.011 (ref 1.005–1.030)
pH: 7 (ref 5.0–8.0)

## 2021-02-04 LAB — CBC WITH DIFFERENTIAL/PLATELET
Abs Immature Granulocytes: 0.01 10*3/uL (ref 0.00–0.07)
Basophils Absolute: 0.1 10*3/uL (ref 0.0–0.1)
Basophils Relative: 1 %
Eosinophils Absolute: 0.2 10*3/uL (ref 0.0–0.5)
Eosinophils Relative: 4 %
HCT: 27.4 % — ABNORMAL LOW (ref 39.0–52.0)
Hemoglobin: 8.5 g/dL — ABNORMAL LOW (ref 13.0–17.0)
Immature Granulocytes: 0 %
Lymphocytes Relative: 31 %
Lymphs Abs: 1.7 10*3/uL (ref 0.7–4.0)
MCH: 32.3 pg (ref 26.0–34.0)
MCHC: 31 g/dL (ref 30.0–36.0)
MCV: 104.2 fL — ABNORMAL HIGH (ref 80.0–100.0)
Monocytes Absolute: 0.5 10*3/uL (ref 0.1–1.0)
Monocytes Relative: 10 %
Neutro Abs: 2.9 10*3/uL (ref 1.7–7.7)
Neutrophils Relative %: 54 %
Platelets: 346 10*3/uL (ref 150–400)
RBC: 2.63 MIL/uL — ABNORMAL LOW (ref 4.22–5.81)
RDW: 16.5 % — ABNORMAL HIGH (ref 11.5–15.5)
WBC: 5.3 10*3/uL (ref 4.0–10.5)
nRBC: 0 % (ref 0.0–0.2)

## 2021-02-04 LAB — RESP PANEL BY RT-PCR (FLU A&B, COVID) ARPGX2
Influenza A by PCR: NEGATIVE
Influenza B by PCR: NEGATIVE
SARS Coronavirus 2 by RT PCR: NEGATIVE

## 2021-02-04 LAB — GLUCOSE, CAPILLARY
Glucose-Capillary: 118 mg/dL — ABNORMAL HIGH (ref 70–99)
Glucose-Capillary: 122 mg/dL — ABNORMAL HIGH (ref 70–99)

## 2021-02-04 MED ORDER — ONDANSETRON HCL 4 MG/2ML IJ SOLN: 4.0000 mg | Freq: Four times a day (QID) | INTRAMUSCULAR | Status: DC | PRN

## 2021-02-04 MED ORDER — SODIUM BICARBONATE 650 MG PO TABS
650.0000 mg | ORAL_TABLET | Freq: Two times a day (BID) | ORAL | Status: DC
  Administered 2021-02-04 – 2021-02-06 (×5): 650 mg via ORAL
  Filled 2021-02-04 (×5): qty 1

## 2021-02-04 MED ORDER — MIDODRINE HCL 5 MG PO TABS
5.0000 mg | ORAL_TABLET | Freq: Two times a day (BID) | ORAL | Status: DC
  Administered 2021-02-04 – 2021-02-06 (×4): 5 mg via ORAL
  Filled 2021-02-04 (×5): qty 1

## 2021-02-04 MED ORDER — ENOXAPARIN SODIUM 40 MG/0.4ML IJ SOSY
40.0000 mg | PREFILLED_SYRINGE | INTRAMUSCULAR | Status: DC
  Administered 2021-02-04 – 2021-02-09 (×6): 40 mg via SUBCUTANEOUS
  Filled 2021-02-04 (×6): qty 0.4

## 2021-02-04 MED ORDER — ASPIRIN EC 81 MG PO TBEC
81.0000 mg | DELAYED_RELEASE_TABLET | Freq: Every day | ORAL | Status: DC
  Administered 2021-02-04 – 2021-02-09 (×6): 81 mg via ORAL
  Filled 2021-02-04 (×6): qty 1

## 2021-02-04 MED ORDER — FERROUS GLUCONATE 324 (38 FE) MG PO TABS
324.0000 mg | ORAL_TABLET | Freq: Two times a day (BID) | ORAL | Status: DC
  Administered 2021-02-04 – 2021-02-09 (×10): 324 mg via ORAL
  Filled 2021-02-04 (×12): qty 1

## 2021-02-04 MED ORDER — SODIUM ZIRCONIUM CYCLOSILICATE 10 G PO PACK
10.0000 g | PACK | Freq: Once | ORAL | Status: AC
  Administered 2021-02-04: 10 g via ORAL
  Filled 2021-02-04: qty 1

## 2021-02-04 MED ORDER — MAGNESIUM SULFATE IN D5W 1-5 GM/100ML-% IV SOLN
1.0000 g | Freq: Once | INTRAVENOUS | Status: AC
  Administered 2021-02-04: 1 g via INTRAVENOUS
  Filled 2021-02-04: qty 100

## 2021-02-04 MED ORDER — ACETAMINOPHEN 325 MG PO TABS
650.0000 mg | ORAL_TABLET | Freq: Four times a day (QID) | ORAL | Status: DC | PRN
  Administered 2021-02-05: 650 mg via ORAL
  Filled 2021-02-04: qty 2

## 2021-02-04 MED ORDER — FUROSEMIDE 20 MG PO TABS
10.0000 mg | ORAL_TABLET | Freq: Every day | ORAL | Status: DC
  Administered 2021-02-04 – 2021-02-05 (×2): 10 mg via ORAL
  Filled 2021-02-04 (×2): qty 1

## 2021-02-04 MED ORDER — INSULIN ASPART 100 UNIT/ML IJ SOLN
0.0000 [IU] | Freq: Three times a day (TID) | INTRAMUSCULAR | Status: DC
  Administered 2021-02-04: 2 [IU] via SUBCUTANEOUS
  Administered 2021-02-05 – 2021-02-06 (×2): 3 [IU] via SUBCUTANEOUS

## 2021-02-04 MED ORDER — MAGNESIUM OXIDE -MG SUPPLEMENT 400 (240 MG) MG PO TABS
400.0000 mg | ORAL_TABLET | Freq: Every day | ORAL | Status: DC
  Administered 2021-02-04 – 2021-02-09 (×6): 400 mg via ORAL
  Filled 2021-02-04 (×6): qty 1

## 2021-02-04 MED ORDER — PRAVASTATIN SODIUM 10 MG PO TABS
20.0000 mg | ORAL_TABLET | Freq: Every day | ORAL | Status: DC
  Administered 2021-02-04 – 2021-02-09 (×6): 20 mg via ORAL
  Filled 2021-02-04 (×6): qty 2

## 2021-02-04 MED ORDER — ACETAMINOPHEN 650 MG RE SUPP: 650.0000 mg | Freq: Four times a day (QID) | RECTAL | Status: DC | PRN

## 2021-02-04 MED ORDER — OMEPRAZOLE MAGNESIUM 20 MG PO TBEC: 20.0000 mg | DELAYED_RELEASE_TABLET | ORAL | Status: DC

## 2021-02-04 MED ORDER — PANTOPRAZOLE SODIUM 40 MG PO TBEC
40.0000 mg | DELAYED_RELEASE_TABLET | Freq: Every day | ORAL | Status: DC
  Administered 2021-02-04 – 2021-02-09 (×6): 40 mg via ORAL
  Filled 2021-02-04 (×6): qty 1

## 2021-02-04 MED ORDER — ONDANSETRON HCL 4 MG PO TABS: 4.0000 mg | ORAL_TABLET | Freq: Four times a day (QID) | ORAL | Status: DC | PRN

## 2021-02-04 NOTE — ED Notes (Signed)
Neuro at bedside.
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
## 2021-02-04 NOTE — ED Notes (Signed)
EEG at bedside.

## 2021-02-04 NOTE — ED Notes (Signed)
Watching TV, A/O to self and place.

## 2021-02-04 NOTE — Progress Notes (Signed)
vLTM started   All impedances below 10kohms  Atriium to monitor.  Pt event button tested
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
## 2021-02-04 NOTE — Progress Notes (Signed)
EEG complete - results pending 
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
## 2021-02-04 NOTE — ED Notes (Signed)
MD Gardner at bedside. 

## 2021-02-04 NOTE — Procedures (Addendum)
History: 76 year old male with left-sided jerking type activity  Sedation: None  Technique: This EEG was acquired with electrodes placed according to the International 10-20 electrode system (including Fp1, Fp2, F3, F4, C3, C4, P3, P4, O1, O2, T3, T4, T5, T6, A1, A2, Fz, Cz, Pz). The following electrodes were missing or displaced: none.   Background: The background consists of intermixed alpha and beta activities. There is a  posterior dominant rhythm of 8 hz that attenuates with eye opening. Sleep is recorded with normal appearing structures.   Photic stimulation: Physiologic driving is not performed  EEG Abnormalities: None  Clinical Interpretation: This normal EEG is recorded in the waking and drowsy state. There was no seizure or seizure predisposition recorded on this study. Please note that lack of epileptiform activity on EEG does not preclude the possibility of epilepsy.   There was an episode of left shoulder jerking which on EEG is only associated with what I suspect to be myogenic artifact.  The semiology on video, however is slightly concerning and therefore I would favor a more prolonged study to further assess.  I have ordered this.  Roland Rack, MD [facility] [phone]  If 7pm- 7am, please page neurology on call as listed in [place].

## 2021-02-04 NOTE — Progress Notes (Signed)
PT Cancellation & Discharge Note  Patient Details Name: OSIE AMPARO MRN: [id number] DOB: 27-Mar-1945   Cancelled Treatment:    Reason Eval/Treat Not Completed: PT screened, no needs identified, will sign off. Per chart, pt has been bedbound at least 15 years and has recently gone to a long-term residential SNF for care. PT screened, no needs identified, will sign off.  Moishe Spice, PT, DPT Acute Rehabilitation Services  Pager: [phone] Office: [place] 02/04/2021, 4:52 PM

## 2021-02-05 DIAGNOSIS — E1122 Type 2 diabetes mellitus with diabetic chronic kidney disease: Secondary | ICD-10-CM | POA: Diagnosis present

## 2021-02-05 DIAGNOSIS — Z888 Allergy status to other drugs, medicaments and biological substances status: Secondary | ICD-10-CM | POA: Diagnosis not present

## 2021-02-05 DIAGNOSIS — E785 Hyperlipidemia, unspecified: Secondary | ICD-10-CM | POA: Diagnosis present

## 2021-02-05 DIAGNOSIS — Z20822 Contact with and (suspected) exposure to covid-19: Secondary | ICD-10-CM | POA: Diagnosis present

## 2021-02-05 DIAGNOSIS — E872 Acidosis, unspecified: Secondary | ICD-10-CM | POA: Diagnosis present

## 2021-02-05 DIAGNOSIS — L89152 Pressure ulcer of sacral region, stage 2: Secondary | ICD-10-CM | POA: Diagnosis present

## 2021-02-05 DIAGNOSIS — E1151 Type 2 diabetes mellitus with diabetic peripheral angiopathy without gangrene: Secondary | ICD-10-CM | POA: Diagnosis present

## 2021-02-05 DIAGNOSIS — R569 Unspecified convulsions: Secondary | ICD-10-CM | POA: Diagnosis present

## 2021-02-05 DIAGNOSIS — D631 Anemia in chronic kidney disease: Secondary | ICD-10-CM | POA: Diagnosis present

## 2021-02-05 DIAGNOSIS — R253 Fasciculation: Secondary | ICD-10-CM | POA: Diagnosis not present

## 2021-02-05 DIAGNOSIS — G8220 Paraplegia, unspecified: Secondary | ICD-10-CM | POA: Diagnosis present

## 2021-02-05 DIAGNOSIS — I872 Venous insufficiency (chronic) (peripheral): Secondary | ICD-10-CM | POA: Diagnosis present

## 2021-02-05 DIAGNOSIS — K219 Gastro-esophageal reflux disease without esophagitis: Secondary | ICD-10-CM | POA: Diagnosis present

## 2021-02-05 DIAGNOSIS — Z833 Family history of diabetes mellitus: Secondary | ICD-10-CM | POA: Diagnosis not present

## 2021-02-05 DIAGNOSIS — N2589 Other disorders resulting from impaired renal tubular function: Secondary | ICD-10-CM | POA: Diagnosis present

## 2021-02-05 DIAGNOSIS — F445 Conversion disorder with seizures or convulsions: Secondary | ICD-10-CM | POA: Diagnosis not present

## 2021-02-05 DIAGNOSIS — D509 Iron deficiency anemia, unspecified: Secondary | ICD-10-CM | POA: Diagnosis present

## 2021-02-05 DIAGNOSIS — E875 Hyperkalemia: Secondary | ICD-10-CM | POA: Diagnosis present

## 2021-02-05 DIAGNOSIS — L89020 Pressure ulcer of left elbow, unstageable: Secondary | ICD-10-CM | POA: Diagnosis present

## 2021-02-05 DIAGNOSIS — Z8673 Personal history of transient ischemic attack (TIA), and cerebral infarction without residual deficits: Secondary | ICD-10-CM | POA: Diagnosis not present

## 2021-02-05 DIAGNOSIS — I4510 Unspecified right bundle-branch block: Secondary | ICD-10-CM | POA: Diagnosis present

## 2021-02-05 DIAGNOSIS — Z87891 Personal history of nicotine dependence: Secondary | ICD-10-CM | POA: Diagnosis not present

## 2021-02-05 DIAGNOSIS — I129 Hypertensive chronic kidney disease with stage 1 through stage 4 chronic kidney disease, or unspecified chronic kidney disease: Secondary | ICD-10-CM | POA: Diagnosis present

## 2021-02-05 DIAGNOSIS — N184 Chronic kidney disease, stage 4 (severe): Secondary | ICD-10-CM | POA: Diagnosis present

## 2021-02-05 DIAGNOSIS — I9589 Other hypotension: Secondary | ICD-10-CM | POA: Diagnosis present

## 2021-02-05 LAB — CBC
HCT: 29.4 % — ABNORMAL LOW (ref 39.0–52.0)
Hemoglobin: 9 g/dL — ABNORMAL LOW (ref 13.0–17.0)
MCH: 30.9 pg (ref 26.0–34.0)
MCHC: 30.6 g/dL (ref 30.0–36.0)
MCV: 101 fL — ABNORMAL HIGH (ref 80.0–100.0)
Platelets: 305 10*3/uL (ref 150–400)
RBC: 2.91 MIL/uL — ABNORMAL LOW (ref 4.22–5.81)
RDW: 16.2 % — ABNORMAL HIGH (ref 11.5–15.5)
WBC: 4.3 10*3/uL (ref 4.0–10.5)
nRBC: 0 % (ref 0.0–0.2)

## 2021-02-05 LAB — BASIC METABOLIC PANEL
Anion gap: 8 (ref 5–15)
BUN: 21 mg/dL (ref 8–23)
CO2: 20 mmol/L — ABNORMAL LOW (ref 22–32)
Calcium: 8.8 mg/dL — ABNORMAL LOW (ref 8.9–10.3)
Chloride: 111 mmol/L (ref 98–111)
Creatinine, Ser: 1 mg/dL (ref 0.61–1.24)
GFR, Estimated: >60 mL/min (ref >60)
Glucose, Bld: 95 mg/dL (ref 70–99)
Potassium: 6.3 mmol/L (ref 3.5–5.1)
Sodium: 139 mmol/L (ref 135–145)

## 2021-02-05 LAB — GLUCOSE, CAPILLARY
Glucose-Capillary: 103 mg/dL — ABNORMAL HIGH (ref 70–99)
Glucose-Capillary: 153 mg/dL — ABNORMAL HIGH (ref 70–99)
Glucose-Capillary: 89 mg/dL (ref 70–99)

## 2021-02-05 LAB — MAGNESIUM: Magnesium: 1.7 mg/dL (ref 1.7–2.4)

## 2021-02-05 MED ORDER — SODIUM ZIRCONIUM CYCLOSILICATE 10 G PO PACK
10.0000 g | PACK | Freq: Once | ORAL | Status: AC
  Administered 2021-02-05: 10 g via ORAL
  Filled 2021-02-05: qty 1

## 2021-02-05 NOTE — Consult Note (Signed)
[facility] Nurse Consult Note: Reason for Consult: wounds on bilateral arms and LEs. Patient is known to our service from  past admissions. The dermatological condition on the LEs is outside the scope of [facility] nursing practice as they have been diagnosed by Dermatology.  We can provide conservative care guidance for Nursing via the Orders. Wound type: idiopathic Pressure Injury POA: Yes Measurement: Per Nursing flow sheet Sacral: Stage 2:  7cm x 11cm x 0.1cm pink, moist Left elbow, full thickness, 4cm x 5cm   Wound bed: See photos provided to EMR. Drainage (amount, consistency, odor) Small serous Periwound: areas of intact skin Dressing procedure/placement/frequency:  I have provided Nursing with conservative care guidance using NS cleanse to the wounds of the bilateral UEs and LE stopped with xeroform gauze as a wound contact layer. This is to be topped with ABD pads and secured with Kerlix roll gauze (on the LEs this is to be applied from toes to just below knees) and secured with paper tape.  The sacrum is to have xeroform gauze as a wound contact layer, topped with dry gauze and covered with a silicone foam dressing.  Turning and repositioning is indicated.  I will provide a mattress replacement with low air loss feature as patient is reluctant to turn and reposition and moisture management/management of microclimate is a priority.  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 Thickening and hyperpigmentation and hyperkeratosis of the BLE and hands/knuckles. 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 This is beyond the scope of practice for the [facility] nurse and further guidance other than our conservative measures outlined today should come from Dermatology.   [facility] nursing team will not follow, but will remain available to this patient, the nursing and medical teams.  Please  re-consult if needed. Thanks, Maudie Flakes, MSN, RN, [place], [facility]  Pager# [phone]

## 2021-02-05 NOTE — Progress Notes (Signed)
NEUROLOGY CONSULTATION PROGRESS NOTE   Date of service: February 05, 2021 Patient Name: [name] [name] MRN:  [id number] DOB:  May 23, 1945   Interval Hx   Declined to let me do a full exam him citing that he wants to sleep and that he hurts all over. Refusing meds per discussion with hospitalist team.  Family worried about events. We were able to capture one of his events on cEEG with side to side head movement, rhythmic jaw jerking, bilateral shoulder jerking without any EEG correlate  The RN in AM also witnessed the event with the following documentation. "Upon entering room, patient was moving/shaking left arm up and down, smacking lips, eyes closing. When writer called out patient name, he immediately opened eyes and spoke clearly with me. Writer questioned him about his movements and he stated that he sometimes does that when falling asleep."  Vitals   Vitals:   02/04/21 1735 02/04/21 2038 02/05/21 0129 02/05/21 0520  BP: (!) 167/89 (!) 178/91 (!) 156/82 (!) 151/83  Pulse: 71 64 73 72  Resp: 16 16 16 18   Temp: 98.2 F (36.8 C) (!) 97.5 F (36.4 C) (!) 97.5 F (36.4 C) 97.6 F (36.4 C)  TempSrc: Oral Oral Oral Oral  SpO2: 100% 100% 100% 100%     There is no height or weight on file to calculate BMI.  Physical Exam   General: Laying comfortably in bed; in no acute distress.  HENT: Normal oropharynx and mucosa. Normal external appearance of ears and nose.  Neck: Supple, no pain or tenderness  CV: No JVD. No peripheral edema.  Pulmonary: Symmetric Chest rise. Normal respiratory effort.  Abdomen: Soft to touch, non-tender.  Ext: No cyanosis, edema, amputation of R hand Skin: No rash. Normal palpation of skin.   Musculoskeletal: Normal digits and nails by inspection. No clubbing.   Neurologic Examination  Mental status/Cognition: somnolent, opens eyes to tactile stimulation, oriented to self, place, but not to month and year Speech/language: mildly dysarthric speech, fluent,  comprehension intact, object naming intact. Cranial nerves:   CN II Pupils equal and reactive to light   CN III,IV,VI EOM intact, no gaze preference or deviation, no nystagmus   CN V normal sensation in V1, V2, and V3 segments bilaterally   CN VII no asymmetry, no nasolabial fold flattening   CN VIII normal hearing to speech   CN IX & X    CN XI    CN XII    Motor:  Muscle bulk: poor, tone increased. Can lift LUE off the bed but unable to grip my hand. Declined to let me inspect or evaluate hi lower extremities.  Declined further exam and easily agitated so unable to get reflexes, evaluate sensation or coordination.  Labs   Basic Metabolic Panel:  Lab Results  Component Value Date   NA 139 02/03/2021   K 5.9 (H) 02/04/2021   CO2 21 (L) 02/03/2021   GLUCOSE 125 (H) 02/03/2021   BUN 23 02/03/2021   CREATININE 1.11 02/03/2021   CALCIUM 8.3 (L) 02/03/2021   GFRNONAA >60 02/03/2021   GFRAA >60 12/02/2017   HbA1c:  Lab Results  Component Value Date   HGBA1C 7.6 (H) 01/13/2021   LDL:  Lab Results  Component Value Date   LDLCALC 69 01/13/2021   Urine Drug Screen:     Component Value Date/Time   LABOPIA NONE DETECTED 01/13/2021 0044   COCAINSCRNUR NONE DETECTED 01/13/2021 0044   LABBENZ NONE DETECTED 01/13/2021 0044   AMPHETMU NONE  DETECTED 01/13/2021 0044   THCU NONE DETECTED 01/13/2021 0044   LABBARB NONE DETECTED 01/13/2021 0044    Alcohol Level     Component Value Date/Time   ETH <10 01/12/2021 1948   No results found for: PHENYTOIN, ZONISAMIDE, LAMOTRIGINE, LEVETIRACETA No results found for: PHENYTOIN, PHENOBARB, VALPROATE, CBMZ  Imaging and Diagnostic studies  CT HEAD WO CONTRAST (5MM)  1.  No acute intracranial abnormality. 2.  Stable diffuse atrophy and chronic ischemic changes. 3.  New acute right frontal, maxillary and sphenoid sinusitis.    Impression   [name] [name] is a 76 y.o. adult with worsening of the underlying deficits in the setting  of sinusitis, multiple electrolyte abnormalities and events of shaking.   We were able to capture one of his events on LTM with side to side head movement, rhythmic jaw jerking, bilateral shoulder jerking without any EEG correlate.  Recommendations  - Will keep him on cEEG for 24 hours and then discontinue LTM. If negative for seizures, no further workup. ___________________________________________________________________  Plan discussed with Dr. Early Osmond.  Thank you for the opportunity to take part in the care of this patient. If you have any further questions, please contact the neurology consultation attending.  Signed,  [name] [name] Pager Number [phone]

## 2021-02-05 NOTE — Progress Notes (Signed)
LTM maintain done at bedside. No skin breakdown noted.

## 2021-02-05 NOTE — Procedures (Signed)
EEG Procedure CPT/Type of Study: 35597; 2-12hr EEG with video Referring Provider: Pahwani Primary Neurological Diagnosis: shaking spells  History: This is a 76 yr old patient, undergoing an EEG to evaluate for shaking spells. Clinical State: agitated  Technical Description:  The EEG was performed using standard setting per the guidelines of American Clinical Neurophysiology Society (ACNS).  A minimum of 21 electrodes were placed on scalp according to the International 10-20 or/and 10-10 Systems. Supplemental electrodes were placed as needed. Single EKG electrode was also used to detect cardiac arrhythmia. Patient's behavior was continuously recorded on video simultaneously with EEG. A minimum of 16 channels were used for data display. Each epoch of study was reviewed manually daily and as needed using standard referential and bipolar montages. Computerized quantitative EEG analysis (such as compressed spectral array analysis, trending, automated spike & seizure detection) were used as indicated.   Day 1: from 2306 02/04/21 to 0730 02/05/21  EEG Description: Overall Amplitude:Normal Predominant Frequency: The background activity showed posterior dominant alpha, with about 10 Hz, that was frequent. Superimposed Frequencies: occasional theta and some beta activity bilaterally The background was symmetric  Background Abnormalities: None Rhythmic or periodic pattern: No Epileptiform activity: no Electrographic seizures: no Events: Yes; one prolonged shaking event of side to side head movement, rhythmic jaw jerking, bilateral shoulder jerking without any EEG correlate   Breach rhythm: no  Reactivity: Present  Stimulation procedures:  Hyperventilation: not done Photic stimulation: not done  Sleep Background: Stage II  EKG:no significant arrhythmia  Impression: This was a normal continuous video EEG with no epileptiform discharges. One prolonged clinical shaking spell was captured as  above without any EEG correlate; this appeared psychogenic/behavioral in etiology.
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
## 2021-02-05 NOTE — Progress Notes (Signed)
Patient refusing morning labs and vital signs. Will check with patient again after he wakes. MD Pahwani aware.

## 2021-02-05 NOTE — Progress Notes (Signed)
Notified by EEG tech 3x this morning that patient was having an event. Upon entering room, patient was moving/shaking left arm up and down, smacking lips, eyes closing. When writer called out patient name, he immediately opened eyes and spoke clearly with me. Writer questioned him about his movements and he stated that he sometimes does that when falling asleep. Will continue to closely monitor. Patient event button pressed.

## 2021-02-05 NOTE — Progress Notes (Signed)
Critical potassium of 6.3. MD Pahwani made aware. Waiting for new order of loklema.

## 2021-02-06 DIAGNOSIS — E875 Hyperkalemia: Secondary | ICD-10-CM | POA: Diagnosis not present

## 2021-02-06 DIAGNOSIS — R253 Fasciculation: Secondary | ICD-10-CM | POA: Diagnosis not present

## 2021-02-06 DIAGNOSIS — F445 Conversion disorder with seizures or convulsions: Secondary | ICD-10-CM

## 2021-02-06 LAB — GLUCOSE, CAPILLARY
Glucose-Capillary: 110 mg/dL — ABNORMAL HIGH (ref 70–99)
Glucose-Capillary: 118 mg/dL — ABNORMAL HIGH (ref 70–99)
Glucose-Capillary: 185 mg/dL — ABNORMAL HIGH (ref 70–99)
Glucose-Capillary: 80 mg/dL (ref 70–99)
Glucose-Capillary: 84 mg/dL (ref 70–99)

## 2021-02-06 LAB — BASIC METABOLIC PANEL
Anion gap: 11 (ref 5–15)
Anion gap: 8 (ref 5–15)
BUN: 25 mg/dL — ABNORMAL HIGH (ref 8–23)
BUN: 22 mg/dL (ref 8–23)
CO2: 18 mmol/L — ABNORMAL LOW (ref 22–32)
CO2: 21 mmol/L — ABNORMAL LOW (ref 22–32)
Calcium: 8.9 mg/dL (ref 8.9–10.3)
Calcium: 8.2 mg/dL — ABNORMAL LOW (ref 8.9–10.3)
Chloride: 112 mmol/L — ABNORMAL HIGH (ref 98–111)
Chloride: 112 mmol/L — ABNORMAL HIGH (ref 98–111)
Creatinine, Ser: 1.11 mg/dL (ref 0.61–1.24)
Creatinine, Ser: 1.12 mg/dL (ref 0.61–1.24)
GFR, Estimated: >60 mL/min (ref >60)
GFR, Estimated: >60 mL/min (ref >60)
Glucose, Bld: 114 mg/dL — ABNORMAL HIGH (ref 70–99)
Glucose, Bld: 110 mg/dL — ABNORMAL HIGH (ref 70–99)
Potassium: 6.1 mmol/L — ABNORMAL HIGH (ref 3.5–5.1)
Potassium: 5.4 mmol/L — ABNORMAL HIGH (ref 3.5–5.1)
Sodium: 141 mmol/L (ref 135–145)
Sodium: 141 mmol/L (ref 135–145)

## 2021-02-06 LAB — POTASSIUM
Potassium: 4.4 mmol/L (ref 3.5–5.1)
Potassium: 4.9 mmol/L (ref 3.5–5.1)

## 2021-02-06 MED ORDER — ALBUTEROL SULFATE (2.5 MG/3ML) 0.083% IN NEBU
10.0000 mg | INHALATION_SOLUTION | Freq: Once | RESPIRATORY_TRACT | Status: AC
  Administered 2021-02-06: 10 mg via RESPIRATORY_TRACT
  Filled 2021-02-06: qty 12

## 2021-02-06 MED ORDER — DEXTROSE 50 % IV SOLN
1.0000 | Freq: Once | INTRAVENOUS | Status: AC
  Administered 2021-02-06: 50 mL via INTRAVENOUS
  Filled 2021-02-06: qty 50

## 2021-02-06 MED ORDER — FLUDROCORTISONE ACETATE 0.1 MG PO TABS
0.1000 mg | ORAL_TABLET | Freq: Every day | ORAL | Status: DC
  Administered 2021-02-06 – 2021-02-08 (×3): 0.1 mg via ORAL
  Filled 2021-02-06 (×4): qty 1

## 2021-02-06 MED ORDER — SODIUM CHLORIDE 0.9 % IV BOLUS
500.0000 mL | Freq: Once | INTRAVENOUS | Status: AC
  Administered 2021-02-06: 500 mL via INTRAVENOUS

## 2021-02-06 MED ORDER — SODIUM BICARBONATE 650 MG PO TABS: 650.0000 mg | ORAL_TABLET | Freq: Three times a day (TID) | ORAL | Status: DC

## 2021-02-06 MED ORDER — FUROSEMIDE 40 MG PO TABS
40.0000 mg | ORAL_TABLET | Freq: Two times a day (BID) | ORAL | Status: DC
  Administered 2021-02-06 – 2021-02-08 (×5): 40 mg via ORAL
  Filled 2021-02-06 (×5): qty 1

## 2021-02-06 MED ORDER — SODIUM ZIRCONIUM CYCLOSILICATE 10 G PO PACK: 10.0000 g | PACK | Freq: Two times a day (BID) | ORAL | Status: DC

## 2021-02-06 MED ORDER — SODIUM BICARBONATE 650 MG PO TABS
1300.0000 mg | ORAL_TABLET | Freq: Three times a day (TID) | ORAL | Status: DC
  Administered 2021-02-06 – 2021-02-09 (×9): 1300 mg via ORAL
  Filled 2021-02-06 (×10): qty 2

## 2021-02-06 MED ORDER — INSULIN ASPART 100 UNIT/ML IV SOLN
5.0000 [IU] | Freq: Once | INTRAVENOUS | Status: AC
  Administered 2021-02-06: 5 [IU] via INTRAVENOUS

## 2021-02-06 MED ORDER — SODIUM ZIRCONIUM CYCLOSILICATE 10 G PO PACK
10.0000 g | PACK | Freq: Two times a day (BID) | ORAL | Status: DC
  Administered 2021-02-06 – 2021-02-08 (×5): 10 g via ORAL
  Filled 2021-02-06 (×5): qty 1

## 2021-02-06 MED ORDER — MIDODRINE HCL 5 MG PO TABS
5.0000 mg | ORAL_TABLET | Freq: Three times a day (TID) | ORAL | Status: DC
  Administered 2021-02-06 – 2021-02-08 (×6): 5 mg via ORAL
  Filled 2021-02-06 (×6): qty 1

## 2021-02-06 NOTE — Progress Notes (Signed)
LTM discontinued at bedside now. No skin breakdown noted. Results pending.

## 2021-02-06 NOTE — Progress Notes (Signed)
LTM maintain at bedside. No skin breakdown noted. Results pending.
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
## 2021-02-06 NOTE — Procedures (Signed)
Patient Name: MOE GRACA  MRN: [id number]  Epilepsy Attending: Lora Havens  Referring Physician/Provider: Dr Early Osmond Duration: 1/2/22023 0730 to 02/06/2021 0730  Patient history: 76 yr old patient, undergoing an EEG to evaluate for shaking spells  Level of alertness: Awake, asleep  AEDs during EEG study: None  Technical aspects: This EEG study was done with scalp electrodes positioned according to the 10-20 International system of electrode placement. Electrical activity was acquired at a sampling rate of 500Hz  and reviewed with a high frequency filter of 70Hz  and a low frequency filter of 1Hz . EEG data were recorded continuously and digitally stored.   Description: The posterior dominant rhythm consists of 9-10 Hz activity of moderate voltage (25-35 uV) seen predominantly in posterior head regions, symmetric and reactive to eye opening and eye closing. Sleep was characterized by vertex waves, sleep spindles (12 to 14 Hz), maximal frontocentral region.    Multiple episodes were captured throughout the study.  During the episodes patient was noted to have mouth twitching and left shoulder jerking at times associated with side to side head movement. Patient was able to respond to staff in the room. Episode continued on and off lasting for upto an hour at a time. Episodes only happen when patient was awake. Concomitant EEG before, during and after the event showed normal posterior dominant rhythm.  Hyperventilation and photic stimulation were not performed.     IMPRESSION: This study is within normal limits. No seizures or epileptiform discharges were seen throughout the recording.  Multiple events were recorded as described above without concomitant EEG change.  These episodes were NON-epileptic.   Maninder Deboer Barbra Sarks

## 2021-02-06 NOTE — Progress Notes (Signed)
Subjective: Patient was noted to have left shoulder twitching and movements this morning when I went to evaluate him.  When I asked him about the movements, he said he is not aware of any jerking and the movements stopped.  He kept requesting for me to let him sleep.  ROS: negative except above  Examination  Vital signs in last 24 hours: Temp:  [98.2 F (36.8 C)-98.9 F (37.2 C)] 98.9 F (37.2 C) (01/23 1034) Pulse Rate:  [77-85] 85 (01/23 1034) Resp:  [16] 16 (01/23 1034) BP: (85-188)/(51-97) 85/51 (01/23 1034) SpO2:  [92 %-97 %] 94 % (01/23 1034)  General: lying in bed, not in apparent distress CVS: pulse-normal rate and rhythm RS: breathing comfortably, CTA B Extremities: Warm, no edema Neuro: Awake, alert, oriented to time place and person, follows simple one-step commands, had left shoulder twitching when I first walked in the room but stopped once patient was awake and talking to me.  Patient did not cooperate with rest of the exam and kept requesting me to let him sleep.   Basic Metabolic Panel: Recent Labs  Lab 02/03/21 2237 02/04/21 0013 02/04/21 4403 02/04/21 0853 02/05/21 1523 02/06/21 0833  NA 139  --   --   --  139 141  K 6.3* 6.1* 5.7* 5.9* 6.3* 6.1*  CL 112*  --   --   --  111 112*  CO2 21*  --   --   --  20* 18*  GLUCOSE 125*  --   --   --  95 114*  BUN 23  --   --   --  21 25*  CREATININE 1.11  --   --   --  1.00 1.11  CALCIUM 8.3*  --   --   --  8.8* 8.9  MG 1.5*  --   --   --  1.7  --     CBC: Recent Labs  Lab 02/03/21 2237 02/05/21 1523  WBC 5.3 4.3  NEUTROABS 2.9  --   HGB 8.5* 9.0*  HCT 27.4* 29.4*  MCV 104.2* 101.0*  PLT 346 305     Coagulation Studies: No results for input(s): LABPROT, INR in the last 72 hours.  Imaging  CT head without contrast 02/04/2021: No acute intracranial abnormality. 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 Stable diffuse  atrophy and chronic ischemic changes. New acute right frontal, maxillary and sphenoid sinusitis.    ASSESSMENT AND PLAN: 76 year old male with repeated frequent episodes of mild movement and left-sided twitching without concomitant EEG change.    Left shoulder jerking -EEG captured multiple events without concomitant EEG change.  These episodes only happen when patient is awake, patient is able to stop them voluntarily, EEG did not show any ictal-interictal abnormality.  Therefore, I strongly suspect these episodes are nonepileptic. -Additionally, it seems that these episodes started on Friday after he was given a dose of baclofen.  Although this does seem like an exaggerated response, the temporal relationship suggests these are related to baclofen use  Recommendations -We have already stopped baclofen since admission.  Recommend avoiding baclofen in future. I have also added this to patient's allergy list -Currently, these episodes are not affecting patient's ADLs and he does not seem to be bothered by it.  Therefore, I would avoid starting any medications -I did discuss with patient's daughter that as these episodes started after baclofen and we will stop that,  I suspect these episodes will gradually self resolve.  However, if these episodes become bothersome or start affecting his ADLs, we can consider starting him on either Valium or Klonopin -DC LTM EEG -Continue management of rest of comorbidities per primary team  Thank you for allowing [place] to participate in the care of this patient.  Neurology will sign off.  Please call us for any further questions.  I have spent a total of   37 minutes with the patient reviewing hospital notes,  test results, labs and examining the patient as well as establishing an assessment and plan that was discussed personally with the patient, patient's daughter and Dr. Tyrell Antonio.  > 50% of time was spent in direct patient care.    Zeb Comfort Epilepsy [facility] For questions after 5pm please refer to AMION to reach the Neurologist on call
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
## 2021-02-06 NOTE — Procedures (Signed)
Patient Name: [name] [name]  MRN: [id number]  Epilepsy Attending: Lora Havens  Referring Physician/Provider: Dr Early Osmond Duration: 1/23/22023 0730 to 02/06/2021 1029   Patient history: 76 yr old patient, undergoing an EEG to evaluate for shaking spells   Level of alertness: Awake, asleep   AEDs during EEG study: None   Technical aspects: This EEG study was done with scalp electrodes positioned according to the 10-20 International system of electrode placement. Electrical activity was acquired at a sampling rate of 500Hz  and reviewed with a high frequency filter of 70Hz  and a low frequency filter of 1Hz . EEG data were recorded continuously and digitally stored.    Description: The posterior dominant rhythm consists of 9-10 Hz activity of moderate voltage (25-35 uV) seen predominantly in posterior head regions, symmetric and reactive to eye opening and eye closing. Sleep was characterized by vertex waves, sleep spindles (12 to 14 Hz), maximal frontocentral region.     Multiple episodes were captured throughout the study.  During the episodes patient was noted to have mouth twitching and left shoulder jerking at times associated with side to side head movement. Patient was able to respond to staff in the room. Episode continued on and off lasting for upto an hour at a time. Episodes only happen when patient was awake. Concomitant EEG before, during and after the event showed normal posterior dominant rhythm.   Hyperventilation and photic stimulation were not performed.      IMPRESSION: This study is within normal limits. No seizures or epileptiform discharges were seen throughout the recording.   Multiple events were recorded as described above without concomitant EEG change.  These episodes were NON-epileptic.     Sarh Kirschenbaum Barbra Sarks

## 2021-02-07 DIAGNOSIS — E875 Hyperkalemia: Secondary | ICD-10-CM | POA: Diagnosis not present

## 2021-02-07 NOTE — Progress Notes (Incomplete)
Patient refused blood sugar check

## 2021-02-07 NOTE — Progress Notes (Signed)
Subjective:  potassium seems to be trending better but no other chemistries-  I have ordered for today and in the AM-  had 1500 UOP -  no new complaints  Objective Vital signs in last 24 hours: Vitals:   02/06/21 1747 02/06/21 2120 02/07/21 0505 02/07/21 0900  BP: (!) 96/58 (!) 108/59 105/67 113/68  Pulse: 97 92 79 73  Resp: 19 17 17 17   Temp: 99 F (37.2 C) 98.2 F (36.8 C) 98.6 F (37 C) 98.5 F (36.9 C)  TempSrc: Oral Oral    SpO2:   97% 98%   Weight change:   Intake/Output Summary (Last 24 hours) at 02/07/2021 1125 Last data filed at 02/07/2021 8546 Gross per 24 hour  Intake 1449.86 ml  Output 1502 ml  Net -52.14 ml     Assessment/Plan: 76 year old white male with multiple medical issues and skilled nursing facility.  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  Cortisol was within normal limits. We will check aldosterone and plasma renin activity-  is pending.   I agree with treatment with bicarb (I have increased the dose)-he also may need chronic Lokelma, currently Twice daily with improvement in potassium level.  Because he has edema I have added on low-dose loop diuretic which may help with the potassium as well.  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 Hypertension/volume  -has some edema but relative hypotension.  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  Anemia- likely from multiple blood draws during recent hospitalization and this one  [facility]: Basic Metabolic Panel: Recent Labs  Lab 02/05/21 1523  02/06/21 0833 02/06/21 1204 02/06/21 1619 02/06/21 1952  NA 139 141 141  --   --   K 6.3* 6.1* 5.4* 4.4 4.9  CL 111 112* 112*  --   --   CO2 20* 18* 21*  --   --   GLUCOSE 95 114* 110*  --   --   BUN 21 25* 22  --   --   CREATININE 1.00 1.11 1.12  --   --   CALCIUM 8.8* 8.9 8.2*  --   --    Liver Function Tests: Recent Labs  Lab 02/03/21 2237  AST 26  ALT 17  ALKPHOS 78  BILITOT 0.7  PROT 6.5  ALBUMIN 2.6*   No results for input(s): LIPASE, AMYLASE in the last 168 hours. No results for input(s): AMMONIA in the last 168 hours. CBC: Recent Labs  Lab 02/03/21 2237 02/05/21 1523  WBC 5.3 4.3  NEUTROABS 2.9  --   HGB 8.5* 9.0*  HCT 27.4* 29.4*  MCV 104.2* 101.0*  PLT 346 305   Cardiac Enzymes: No results for input(s): CKTOTAL, CKMB, CKMBINDEX, TROPONINI in the last 168 hours. CBG: Recent Labs  Lab 02/06/21 0031 02/06/21 0849 02/06/21 1253 02/06/21 1638 02/06/21 2121  GLUCAP 80 118* 185* 84 110*    Iron Studies: No results for input(s): IRON, TIBC, TRANSFERRIN, FERRITIN in the last 72 hours. Studies/Results: No results found. Medications: Infusions:  Scheduled Medications:  aspirin EC  81 mg Oral Daily   enoxaparin (LOVENOX) injection  40 mg Subcutaneous Q24H   ferrous gluconate  324 mg Oral BID WC   fludrocortisone  0.1 mg Oral Daily   furosemide  40 mg Oral BID   insulin aspart  0-15 Units Subcutaneous TID WC   magnesium oxide  400 mg Oral Daily   midodrine  5 mg Oral TID WC   pantoprazole  40 mg Oral Daily   pravastatin  20 mg Oral Daily   sodium bicarbonate  1,300 mg Oral TID   sodium zirconium cyclosilicate  10 g Oral BID    have reviewed scheduled and prn medications.  Physical Exam: General:  NAD-  amputation of right hand-  ischemic left Heart: RRR Lungs: mostly clear Abdomen:  soft, non tender Extremities: pitting edema-  ischemic feet     02/07/2021,11:25 AM  LOS: 2 days
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
## 2021-02-07 NOTE — TOC Initial Note (Signed)
Transition of Care [facility]) - Initial/Assessment Note    Patient Details  Name: [name] [name] MRN: [id number] Date of Birth: April 13, 1945  [facility] CM/SW Contact:    Milinda Antis, [place] Phone Number: 02/07/2021, 10:08 AM  Clinical Narrative:                 CSW contacted the patient's daughter to discuss discharge planning as the patient is disoriented.  The daughter reports that the patient is from [facility] and agrees with the patient returning when medically ready.    CSW attempted to contact [facility] with admissions at [place].  There was no answer.  CSW left a message informing the facility that per MD, patient may be ready to d/c tomorrow.        Patient Goals and CMS Choice        Expected Discharge Plan and Services                                                Prior Living Arrangements/Services                       Activities of Daily Living      Permission Sought/Granted                  Emotional Assessment              Admission diagnosis:  Hyperkalemia [E87.5] Patient Active Problem List   Diagnosis Date Noted   Renal tubular acidosis, 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   Gram-negative bacteremia    TIA (transient ischemic attack) 01/13/2021   Bilateral cellulitis of lower leg 01/13/2021   Gangrene of hand ([place]) 09/06/2020   Hypokalemia 09/06/2020   Hypomagnesemia 09/06/2020   Cellulitis of right hand    Cellulitis of multiple sites 01/12/2020   Sepsis ([place]) 01/12/2020   COVID-19 virus infection 01/12/2020   Neurocognitive deficits 12/03/2017   Lobar pneumonia ([place]) 11/30/2017   Anemia of chronic disease 11/30/2017   Open wound of right hand with complication 44/81/8563   Malnutrition of moderate degree 11/18/2017   Wounds, multiple 11/16/2017   Multiple wounds 11/16/2017   Paraplegia ([place]) 11/16/2017   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 ([place]) 14/97/0263   Metabolic acidosis 78/58/8502   Pressure  injury of skin 09/25/2017   Depression 04/08/2015   Essential hypertension    Diabetes mellitus without complication ([place])    HLD (hyperlipidemia)    Iron deficiency anemia    Hyperkalemia 02/27/2013   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

## 2021-02-07 NOTE — Progress Notes (Signed)
Patient refused all lab work and blood sugar checks on my shift despite education. MD is aware.

## 2021-02-08 DIAGNOSIS — E875 Hyperkalemia: Secondary | ICD-10-CM | POA: Diagnosis not present

## 2021-02-08 LAB — BASIC METABOLIC PANEL
Anion gap: 9 (ref 5–15)
BUN: 22 mg/dL (ref 8–23)
CO2: 22 mmol/L (ref 22–32)
Calcium: 8.7 mg/dL — ABNORMAL LOW (ref 8.9–10.3)
Chloride: 106 mmol/L (ref 98–111)
Creatinine, Ser: 1.2 mg/dL (ref 0.61–1.24)
GFR, Estimated: >60 mL/min (ref >60)
Glucose, Bld: 103 mg/dL — ABNORMAL HIGH (ref 70–99)
Potassium: 3.8 mmol/L (ref 3.5–5.1)
Sodium: 137 mmol/L (ref 135–145)

## 2021-02-08 LAB — GLUCOSE, CAPILLARY: Glucose-Capillary: 104 mg/dL — ABNORMAL HIGH (ref 70–99)

## 2021-02-08 MED ORDER — SODIUM ZIRCONIUM CYCLOSILICATE 10 G PO PACK: 10.0000 g | PACK | Freq: Every day | ORAL | Status: DC
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
## 2021-02-08 NOTE — Progress Notes (Signed)
Subjective:  no labs-  pt has been refusing -  2800 of UOP.  I asked him again  'Im busy right now" not sure he really understands   Objective Vital signs in last 24 hours: Vitals:   02/07/21 1745 02/07/21 2059 02/08/21 0641 02/08/21 0904  BP: (!) 141/70 (!) 156/95 (!) 156/114 (!) 143/86  Pulse: 80 73 72 79  Resp: 18 18 18 19   Temp: 98.4 F (36.9 C) 98 F (36.7 C) 97.9 F (36.6 C) 98 F (36.7 C)  TempSrc:  Oral  Oral  SpO2:  100%  100%   Weight change:   Intake/Output Summary (Last 24 hours) at 02/08/2021 1146 Last data filed at 02/08/2021 1448 Gross per 24 hour  Intake 600 ml  Output 1850 ml  Net -1250 ml     Assessment/Plan: 76 year old white male with multiple medical issues and skilled nursing facility.  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  Cortisol was within normal limits. We will check aldosterone and plasma renin activity-  is pending.   I agree with treatment with bicarb (I  increased the dose)-he also may need chronic Lokelma, currently Twice daily with improvement in potassium level.  Because he has edema I added on low-dose loop diuretic which may help with the potassium as well.  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 Hypertension/volume  -has some edema but relative hypotension.  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  Anemia- likely from multiple blood draws during recent hospitalization and this one  Primary is getting palliative involved-   if he will not consent to labs not sure what to do with meds.  If we get a lab then I can try to adjust meds for the OP setting -  I will look for labs daily and once I see them I will adjust meds and leave note-  he likely will be cleared for discharge once we have that [place]: Basic Metabolic Panel: Recent Labs  Lab 02/05/21 1523 02/06/21 0833 02/06/21 1204 02/06/21 1619 02/06/21 1952  NA 139 141 141  --   --   K 6.3* 6.1* 5.4* 4.4 4.9  CL 111 112* 112*  --   --   CO2 20* 18* 21*  --   --   GLUCOSE 95 114* 110*  --   --   BUN 21 25* 22  --   --   CREATININE 1.00 1.11 1.12  --   --   CALCIUM 8.8* 8.9 8.2*  --   --    Liver Function Tests: Recent Labs  Lab 02/03/21 2237  AST 26  ALT 17  ALKPHOS 78  BILITOT 0.7  PROT 6.5  ALBUMIN 2.6*   No results for input(s): LIPASE, AMYLASE in the last 168 hours. No results for input(s): AMMONIA in the last 168 hours. CBC: Recent Labs  Lab 02/03/21 2237 02/05/21 1523  WBC 5.3  4.3  NEUTROABS 2.9  --   HGB 8.5* 9.0*  HCT 27.4* 29.4*  MCV 104.2* 101.0*  PLT 346 305   Cardiac Enzymes: No results for input(s): CKTOTAL, CKMB, CKMBINDEX, TROPONINI in the last 168 hours. CBG: Recent Labs  Lab 02/06/21 0031 02/06/21 0849 02/06/21 1253 02/06/21 1638 02/06/21 2121  GLUCAP 80 118* 185* 84 110*    Iron Studies: No results for input(s): IRON, TIBC, TRANSFERRIN, FERRITIN in the last 72 hours. Studies/Results: No results found. Medications: Infusions:   Scheduled Medications:  aspirin EC  81 mg Oral Daily   enoxaparin (LOVENOX) injection  40 mg Subcutaneous Q24H   ferrous gluconate  324 mg Oral BID WC   fludrocortisone  0.1 mg Oral Daily   furosemide  40 mg Oral BID   insulin aspart  0-15 Units Subcutaneous TID WC   magnesium oxide  400 mg Oral Daily   midodrine  5 mg Oral TID WC   pantoprazole  40 mg Oral Daily   pravastatin  20 mg Oral Daily   sodium bicarbonate  1,300 mg Oral TID    sodium zirconium cyclosilicate  10 g Oral BID    have reviewed scheduled and prn medications.  Physical Exam: General:  NAD-  amputation of right hand-  ischemic left Heart: RRR Lungs: mostly clear Abdomen:  soft, non tender Extremities: pitting edema-  ischemic feet     02/08/2021,11:46 AM  LOS: 3 days

## 2021-02-09 MED ORDER — FUROSEMIDE 20 MG PO TABS: 20.0000 mg | ORAL_TABLET | Freq: Two times a day (BID) | ORAL | Status: DC

## 2021-02-09 MED ORDER — FLUDROCORTISONE ACETATE 0.1 MG PO TABS
0.0500 mg | ORAL_TABLET | Freq: Every day | ORAL | Status: DC
  Administered 2021-02-09: 0.05 mg via ORAL
  Filled 2021-02-09: qty 0.5

## 2021-02-09 MED ORDER — FUROSEMIDE 20 MG PO TABS: 20.0000 mg | ORAL_TABLET | Freq: Two times a day (BID) | ORAL | 2 refills | Status: DC

## 2021-02-09 MED ORDER — SODIUM BICARBONATE 650 MG PO TABS: 1300.0000 mg | ORAL_TABLET | Freq: Three times a day (TID) | ORAL | 3 refills | Status: DC

## 2021-02-09 MED ORDER — FLUDROCORTISONE ACETATE 0.1 MG PO TABS: 0.0500 mg | ORAL_TABLET | Freq: Every day | ORAL | 3 refills | Status: DC

## 2021-02-09 NOTE — Progress Notes (Signed)
DISCHARGE NOTE HOME [name] [name] to be discharged Skilled nursing facility per MD order. Discussed prescriptions and follow up appointments with the patient. Prescriptions given to patient; medication list explained in detail. Patient verbalized understanding.  Skin clean, dry and intact without evidence of skin break down, no evidence of skin tears noted. IV catheter discontinued intact. Site without signs and symptoms of complications. Dressing and pressure applied. Pt denies pain at the site currently. No complaints noted.  Patient free of lines, drains, and wounds.   An After Visit Summary (AVS) was printed and given to the patient. Patient escorted via Biomedical scientist, and discharged to [place] via [facility].  [name] Agar, RN

## 2021-02-09 NOTE — Progress Notes (Signed)
Report called to [facility] at [phone]. Gracy Racer, RN.  Aurther Loft, RN

## 2021-02-09 NOTE — Discharge Summary (Signed)
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 of pulmonic stenosis. Aorta: The aortic root and ascending aorta are structurally normal, with no evidence of dilitation. Venous: The inferior vena cava is normal in size with greater than 50% respiratory variability, suggesting right atrial pressure of 3 mmHg. IAS/Shunts: The atrial septum is grossly normal.  LEFT VENTRICLE PLAX 2D LVIDd:         3.70 cm   Diastology LVIDs:         2.75 cm   LV e' medial:    5.98 cm/s LV PW:         1.00 cm   LV E/e' medial:  8.3 LV IVS:        1.00 cm   LV e' lateral:   8.27 cm/s LVOT diam:     2.10 cm   LV E/e' lateral: 6.0 LV SV:         43 LV SV Index:   21 LVOT Area:     3.46 cm  IVC IVC diam: 2.10 cm LEFT ATRIUM             Index LA diam:        2.80 cm 1.39 cm/m LA Vol (A2C):   31.5 ml 15.64 ml/m LA Vol (A4C):   23.3 ml 11.57 ml/m LA Biplane Vol: 27.2 ml 13.51 ml/m  AORTIC VALVE                    PULMONIC VALVE AV Area (Vmax):    2.61 cm     PV Vmax:       0.68 m/s AV Area (Vmean):   2.34 cm     PV Peak grad:  1.8 mmHg AV Area (VTI):     2.30 cm AV Vmax:           87.60 cm/s AV Vmean:          66.400  cm/s AV VTI:  0.185 m AV Peak Grad:      3.1 mmHg AV Mean Grad:      2.0 mmHg LVOT Vmax:         66.10 cm/s LVOT Vmean:        44.900 cm/s LVOT VTI:          0.123 m LVOT/AV VTI ratio: 0.66  AORTA Ao Root diam: 3.20 cm Ao Asc diam:  3.30 cm MITRAL VALVE               TRICUSPID VALVE MV Area (PHT): 3.83 cm    TR Peak grad:   36.2 mmHg MV Decel Time: 198 msec    TR Vmax:        301.00 cm/s MV E velocity: 49.60 cm/s MV A velocity: 75.00 cm/s  SHUNTS MV E/A ratio:  0.66        Systemic VTI:  0.12 m                            Systemic Diam: 2.10 cm Eleonore Chiquito MD Electronically signed by Eleonore Chiquito MD Signature Date/Time: 01/13/2021/1:43:11 PM    Final    CT HEAD CODE STROKE WO CONTRAST  Result Date: 01/12/2021 CLINICAL DATA:  Code stroke.  Acute neurologic deficit EXAM: CT HEAD WITHOUT CONTRAST TECHNIQUE: Contiguous axial images were obtained from the base of the skull through the vertex without intravenous contrast. COMPARISON:  None. FINDINGS: Brain: There is no mass, hemorrhage or extra-axial collection. There is generalized atrophy without lobar predilection. There is hypoattenuation of the periventricular white matter, most commonly indicating chronic ischemic microangiopathy. 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 Vascular: No abnormal hyperdensity of the major intracranial arteries or dural venous sinuses. No intracranial atherosclerosis. Skull: The visualized skull base, calvarium and extracranial soft tissues are normal. Sinuses/Orbits: No fluid levels or advanced mucosal thickening of the visualized paranasal sinuses. No mastoid or middle ear effusion. The orbits are normal. ASPECTS [facility] Stroke Program Early CT Score) - Ganglionic level infarction (caudate, lentiform nuclei, internal capsule, insula, M1-M3 cortex): 7 - Supraganglionic infarction (M4-M6 cortex): 3 Total score (0-10 with 10 being normal): 10 IMPRESSION: 1. No acute intracranial abnormality. 2. ASPECTS is 10. 3.  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 These results were communicated to Dr. Kerney Elbe at 8:15 pm on 01/12/2021 by text page via the [facility] messaging system. Electronically Signed   By: Ulyses Jarred M.D.   On: 01/12/2021 20:16     Subjective: He is alert, denies pain. Feels well, no further twitching.   Discharge Exam: Vitals:   02/09/21 0445 02/09/21 0917  BP: (!) 156/91 136/77  Pulse: 71 81  Resp: 19 20  Temp: 98 F (36.7 C) 98 F (36.7 C)  SpO2: 100% 92%     General: Pt is alert, awake, not in acute distress Cardiovascular: RRR, S1/S2 +, no rubs, no gallops Respiratory: CTA bilaterally, no wheezing, no rhonchi Abdominal: Soft, NT, ND, bowel sounds + Extremities: no edema, no cyanosis    The results of significant diagnostics from this hospitalization (including imaging, microbiology, ancillary and laboratory) are listed below for reference.     Microbiology: Recent Results (from the past 240 hour(s))  Resp Panel by RT-PCR (Flu A&B, Covid) Nasopharyngeal Swab     Status: None   Collection Time: 02/04/21  5:56 AM   Specimen: Nasopharyngeal Swab; Nasopharyngeal(NP) swabs in vial transport medium  Result Value Ref Range Status   SARS  Coronavirus 2 by RT PCR NEGATIVE NEGATIVE Final    Comment: (NOTE) SARS-CoV-2 target nucleic acids are NOT DETECTED.  The SARS-CoV-2 RNA is generally detectable in upper respiratory specimens during the acute phase of infection. The lowest concentration of SARS-CoV-2 viral copies this assay can detect is 138 copies/mL. A negative result does not preclude SARS-Cov-2 infection and should not be used as the sole basis for treatment or other patient management decisions. A negative result may occur with  improper specimen collection/handling, submission of specimen other than nasopharyngeal swab, presence of viral mutation(s) within the areas targeted by this assay, and inadequate number of  viral copies(<138 copies/mL). A negative result must be combined with clinical observations, patient history, and epidemiological information. The expected result is Negative.  Fact Sheet for Patients:  EntrepreneurPulse.com.au  Fact Sheet for Healthcare Providers:  IncredibleEmployment.be  This test is no t yet approved or cleared by the [place] FDA and  has been authorized for detection and/or diagnosis of SARS-CoV-2 by FDA under an Emergency Use Authorization (EUA). This EUA will remain  in effect (meaning this test can be used) for the duration of the COVID-19 declaration under Section 564(b)(1) of the Act, 21 U.S.C.section 360bbb-3(b)(1), unless the authorization is terminated  or revoked sooner.       Influenza A by PCR NEGATIVE NEGATIVE Final   Influenza B by PCR NEGATIVE NEGATIVE Final    Comment: (NOTE) The Xpert Xpress SARS-CoV-2/FLU/RSV plus assay is intended as an aid in the diagnosis of influenza from Nasopharyngeal swab specimens and should not be used as a sole basis for treatment. Nasal washings and aspirates are unacceptable for Xpert Xpress SARS-CoV-2/FLU/RSV testing.  Fact Sheet for Patients: EntrepreneurPulse.com.au  Fact Sheet for Healthcare Providers: IncredibleEmployment.be  This test is not yet approved or cleared by the [place] FDA and has been authorized for detection and/or diagnosis of SARS-CoV-2 by FDA under an Emergency Use Authorization (EUA). This EUA will remain in effect (meaning this test can be used) for the duration of the COVID-19 declaration under Section 564(b)(1) of the Act, 21 U.S.C. section 360bbb-3(b)(1), unless the authorization is terminated or revoked.  Performed at [facility], [place] [address], [place], [place] [address]      Labs: BNP (last 3 results) Recent Labs    01/22/21 0549 01/23/21 1112 01/24/21 0543  BNP 137.3* 78.3  14.9   Basic Metabolic Panel: Recent Labs  Lab 02/03/21 2237 02/04/21 0013 02/05/21 1523 02/06/21 0833 02/06/21 1204 02/06/21 1619 02/06/21 1952 02/08/21 1659  NA 139  --  139 141 141  --   --  137  K 6.3*   < > 6.3* 6.1* 5.4* 4.4 4.9 3.8  CL 112*  --  111 112* 112*  --   --  106  CO2 21*  --  20* 18* 21*  --   --  22  GLUCOSE 125*  --  95 114* 110*  --   --  103*  BUN 23  --  21 25* 22  --   --  22  CREATININE 1.11  --  1.00 1.11 1.12  --   --  1.20  CALCIUM 8.3*  --  8.8* 8.9 8.2*  --   --  8.7*  MG 1.5*  --  1.7  --   --   --   --   --    < > = values in this interval not displayed.   Liver Function Tests: Recent Labs  Lab  02/03/21 2237  AST 26  ALT 17  ALKPHOS 78  BILITOT 0.7  PROT 6.5  ALBUMIN 2.6*   No results for input(s): LIPASE, AMYLASE in the last 168 hours. No results for input(s): AMMONIA in the last 168 hours. CBC: Recent Labs  Lab 02/03/21 2237 02/05/21 1523  WBC 5.3 4.3  NEUTROABS 2.9  --   HGB 8.5* 9.0*  HCT 27.4* 29.4*  MCV 104.2* 101.0*  PLT 346 305   Cardiac Enzymes: No results for input(s): CKTOTAL, CKMB, CKMBINDEX, TROPONINI in the last 168 hours. BNP: Invalid input(s): POCBNP CBG: Recent Labs  Lab 02/06/21 0849 02/06/21 1253 02/06/21 1638 02/06/21 2121 02/08/21 1658  GLUCAP 118* 185* 84 110* 104*   D-Dimer No results for input(s): DDIMER in the last 72 hours. Hgb A1c No results for input(s): HGBA1C in the last 72 hours. Lipid Profile No results for input(s): CHOL, HDL, LDLCALC, TRIG, CHOLHDL, LDLDIRECT in the last 72 hours. Thyroid function studies No results for input(s): TSH, T4TOTAL, T3FREE, THYROIDAB in the last 72 hours.  Invalid input(s): FREET3 Anemia work up No results for input(s): VITAMINB12, FOLATE, FERRITIN, TIBC, IRON, RETICCTPCT in the last 72 hours. Urinalysis    Component Value Date/Time   COLORURINE YELLOW 02/04/2021 [place] 02/04/2021 0619   LABSPEC 1.011 02/04/2021 0619    PHURINE 7.0 02/04/2021 [place] 02/04/2021 0619   HGBUR NEGATIVE 02/04/2021 0619   BILIRUBINUR NEGATIVE 02/04/2021 [place] 02/04/2021 0619   PROTEINUR NEGATIVE 02/04/2021 0619   UROBILINOGEN 0.2 02/21/2013 2012   NITRITE NEGATIVE 02/04/2021 0619   LEUKOCYTESUR NEGATIVE 02/04/2021 8003   Sepsis Labs Invalid input(s): PROCALCITONIN,  WBC,  LACTICIDVEN Microbiology Recent Results (from the past 240 hour(s))  Resp Panel by RT-PCR (Flu A&B, Covid) Nasopharyngeal Swab     Status: None   Collection Time: 02/04/21  5:56 AM   Specimen: Nasopharyngeal Swab; Nasopharyngeal(NP) swabs in vial transport medium  Result Value Ref Range Status   SARS Coronavirus 2 by RT PCR NEGATIVE NEGATIVE Final    Comment: (NOTE) SARS-CoV-2 target nucleic acids are NOT DETECTED.  The SARS-CoV-2 RNA is generally detectable in upper respiratory specimens during the acute phase of infection. The lowest concentration of SARS-CoV-2 viral copies this assay can detect is 138 copies/mL. A negative result does not preclude SARS-Cov-2 infection and should not be used as the sole basis for treatment or other patient management decisions. A negative result may occur with  improper specimen collection/handling, submission of specimen other than nasopharyngeal swab, presence of viral mutation(s) within the areas targeted by this assay, and inadequate number of viral copies(<138 copies/mL). A negative result must be combined with clinical observations, patient history, and epidemiological information. The expected result is Negative.  Fact Sheet for Patients:  EntrepreneurPulse.com.au  Fact Sheet for Healthcare Providers:  IncredibleEmployment.be  This test is no t yet approved or cleared by the [place] FDA and  has been authorized for detection and/or diagnosis of SARS-CoV-2 by FDA under an Emergency Use Authorization (EUA). This EUA will  remain  in effect (meaning this test can be used) for the duration of the COVID-19 declaration under Section 564(b)(1) of the Act, 21 U.S.C.section 360bbb-3(b)(1), unless the authorization is terminated  or revoked sooner.       Influenza A by PCR NEGATIVE NEGATIVE Final   Influenza B by PCR NEGATIVE NEGATIVE Final    Comment: (NOTE) The Xpert Xpress SARS-CoV-2/FLU/RSV plus assay is intended as an aid in  the diagnosis of influenza from Nasopharyngeal swab specimens and should not be used as a sole basis for treatment. Nasal washings and aspirates are unacceptable for Xpert Xpress SARS-CoV-2/FLU/RSV testing.  Fact Sheet for Patients: EntrepreneurPulse.com.au  Fact Sheet for Healthcare Providers: IncredibleEmployment.be  This test is not yet approved or cleared by the [place] FDA and has been authorized for detection and/or diagnosis of SARS-CoV-2 by FDA under an Emergency Use Authorization (EUA). This EUA will remain in effect (meaning this test can be used) for the duration of the COVID-19 declaration under Section 564(b)(1) of the Act, 21 U.S.C. section 360bbb-3(b)(1), unless the authorization is terminated or revoked.  Performed at [facility], [place] [address], [place], [place] [address]      Time coordinating discharge: 40 minutes  SIGNED:   Elmarie Shiley, MD  [facility]

## 2021-02-09 NOTE — Progress Notes (Signed)
Subjective:  finally got some labs late yesterday - k down to 3.8  Objective Vital signs in last 24 hours: Vitals:   02/08/21 0641 02/08/21 0904 02/09/21 0445 02/09/21 0917  BP: (!) 156/114 (!) 143/86 (!) 156/91 136/77  Pulse: 72 79 71 81  Resp: 18 19 19 20   Temp: 97.9 F (36.6 C) 98 F (36.7 C) 98 F (36.7 C) 98 F (36.7 C)  TempSrc:  Oral Oral Oral  SpO2:  100% 100% 92%   Weight change:   Intake/Output Summary (Last 24 hours) at 02/09/2021 0949 Last data filed at 02/09/2021 0600 Gross per 24 hour  Intake 360 ml  Output 3600 ml  Net -3240 ml     Assessment/Plan: 76 year old white male with multiple medical issues and skilled nursing facility.  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  Cortisol was within normal limits. We will check aldosterone and plasma renin activity-  is pending.   I agree with treatment with bicarb (I  increased the dose)- have stopped [place] think he will need.  Because he has edema I added on low-dose loop diuretic which I would continue but will dec dose to 20 BID.  Fludrocortisone also added for aldosterone insufficiency and I did start that, will dec to lowest dose-  2. Hypertension/volume  -has some edema but relative hypotension.  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  Anemia- likely from multiple blood draws during recent hospitalization and this one  From my standpoint is ready for discharge on lasix 20 BID and florinef 0.5 daily-  would try to check chemistries every other month or so to make sure K and bicarb are OK -  will not arrange specific nephrology follow up  [place]: Basic Metabolic Panel: Recent Labs  Lab 02/06/21 0833 02/06/21 1204 02/06/21 1619  02/06/21 1952 02/08/21 1659  NA 141 141  --   --  137  K 6.1* 5.4* 4.4 4.9 3.8  CL 112* 112*  --   --  106  CO2 18* 21*  --   --  22  GLUCOSE 114* 110*  --   --  103*  BUN 25* 22  --   --  22  CREATININE 1.11 1.12  --   --  1.20  CALCIUM 8.9 8.2*  --   --  8.7*   Liver Function Tests: Recent Labs  Lab 02/03/21 2237  AST 26  ALT 17  ALKPHOS 78  BILITOT 0.7  PROT 6.5  ALBUMIN 2.6*   No results for input(s): LIPASE, AMYLASE in the last 168 hours. No results for input(s): AMMONIA in the last 168 hours. CBC: Recent Labs  Lab 02/03/21 2237 02/05/21 1523  WBC 5.3 4.3  NEUTROABS 2.9  --   HGB 8.5* 9.0*  HCT 27.4* 29.4*  MCV 104.2* 101.0*  PLT 346 305   Cardiac Enzymes: No results for input(s): CKTOTAL, CKMB, CKMBINDEX, TROPONINI in the last 168 hours. CBG: Recent Labs  Lab 02/06/21 0849 02/06/21 1253 02/06/21 1638 02/06/21 2121 02/08/21 1658  GLUCAP 118* 185* 84 110* 104*    Iron Studies: No results for input(s): IRON, TIBC, TRANSFERRIN, FERRITIN in the last 72 hours. Studies/Results: No results found. Medications: Infusions:   Scheduled  Medications:  aspirin EC  81 mg Oral Daily   enoxaparin (LOVENOX) injection  40 mg Subcutaneous Q24H   ferrous gluconate  324 mg Oral BID WC   fludrocortisone  0.1 mg Oral Daily   furosemide  40 mg Oral BID   insulin aspart  0-15 Units Subcutaneous TID WC   magnesium oxide  400 mg Oral Daily   midodrine  5 mg Oral TID WC   pantoprazole  40 mg Oral Daily   pravastatin  20 mg Oral Daily   sodium bicarbonate  1,300 mg Oral TID    have reviewed scheduled and prn medications.  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

## 2021-02-09 NOTE — Progress Notes (Incomplete)
Patient refused CBG check last night and lab draw this a.m.

## 2021-02-09 NOTE — TOC Transition Note (Signed)
Transition of Care [facility]) - CM/SW Discharge Note   Patient Details  Name: [name] [name] MRN: [id number] Date of Birth: 1945-11-27  [facility] of [facility] CM/SW Contact:  Milinda Antis, [place] Phone Number: 02/09/2021, 12:09 PM   Clinical Narrative:    Patient will DC to: [facility] SNF Anticipated DC date: 02/09/2021 Family notified: yes, patient's daughter Transport by:  Corey Harold   Per MD patient ready for DC to SNF.  RN to call report prior to discharge [phone] room 205A. RN, patient, patient's family (patient's daughter [name]), and facility notified of DC. Discharge Summary and FL2 sent to facility. DC packet on chart. Ambulance transport requested for patient.   CSW will sign off for now as social work intervention is no longer needed. Please consult [place] again if new needs arise.     Final next level of care: Skilled Nursing Facility Barriers to Discharge: Barriers Resolved   Patient Goals and CMS Choice        Discharge Placement              Patient chooses bed at:  [facility]) Patient to be transferred to facility by: [place] Name of family member notified: Daughter Patient and family notified of of transfer: 02/09/21  Discharge Plan and Services                                     Social Determinants of Health (SDOH) Interventions     Readmission Risk Interventions No flowsheet data found.

## 2021-02-09 NOTE — Progress Notes (Signed)
Palliative-   Consult received- chart reviewed. Noted patient is planned for discharge.  Recommend TOC referral for outpatient Palliative at facility.   Mariana Kaufman, AGNP-C Palliative Medicine  No charge

## 2021-02-09 NOTE — Progress Notes (Signed)
Notified Augustin Schooling about patient discharge.

## 2021-02-10 LAB — ALDOSTERONE + RENIN ACTIVITY W/ RATIO
ALDO / PRA Ratio: <4.8 (ref 0.0–30.0)
Aldosterone: <1 ng/dL (ref 0.0–30.0)
PRA LC/MS/MS: 0.209 ng/mL/hr (ref 0.167–5.380)

## 2021-03-09 ENCOUNTER — Other Ambulatory Visit: Payer: Self-pay

## 2021-03-09 ENCOUNTER — Encounter (HOSPITAL_BASED_OUTPATIENT_CLINIC_OR_DEPARTMENT_OTHER): Payer: Medicare Other | Attending: Internal Medicine | Admitting: Internal Medicine

## 2021-03-09 DIAGNOSIS — I10 Essential (primary) hypertension: Secondary | ICD-10-CM | POA: Diagnosis not present

## 2021-03-09 DIAGNOSIS — S68411D Complete traumatic amputation of right hand at wrist level, subsequent encounter: Secondary | ICD-10-CM | POA: Diagnosis not present

## 2021-03-09 DIAGNOSIS — L97518 Non-pressure chronic ulcer of other part of right foot with other specified severity: Secondary | ICD-10-CM | POA: Insufficient documentation

## 2021-03-09 DIAGNOSIS — L89016 Pressure-induced deep tissue damage of right elbow: Secondary | ICD-10-CM | POA: Diagnosis not present

## 2021-03-09 DIAGNOSIS — S80829D Blister (nonthermal), unspecified lower leg, subsequent encounter: Secondary | ICD-10-CM | POA: Diagnosis not present

## 2021-03-09 DIAGNOSIS — G8220 Paraplegia, unspecified: Secondary | ICD-10-CM | POA: Insufficient documentation

## 2021-03-09 DIAGNOSIS — L97909 Non-pressure chronic ulcer of unspecified part of unspecified lower leg with unspecified severity: Secondary | ICD-10-CM | POA: Diagnosis not present

## 2021-03-09 NOTE — Progress Notes (Signed)
[name] [name], [name] [name] ([id number]) Visit Report for 03/09/2021 Arrival Information Details Patient Name: Date of Service: [name] [name], [name] [name]. 03/09/2021 10:45 A M Medical Record Number: [id number] Patient Account Number: [id number] Date of Birth/Sex: Treating RN: 02/06/1945 (76 y.o. [name] [name] Primary Care [name] [name]: [name] [name] Other Clinician: Referring [name] [name]: Treating [name] [name]/Extender: [name] [name], [name] [name] in Treatment: 81 Visit Information History Since Last Visit Added or deleted any medications: No Patient Arrived: Stretcher Any new allergies or adverse reactions: No Arrival Time: 11:09 Had a fall or experienced change in No Accompanied By: self activities of daily living that may affect Transfer Assistance: None risk of falls: Patient Identification Verified: Yes Signs or symptoms of abuse/neglect since last visito No Secondary Verification Process Completed: Yes Hospitalized since last visit: No Patient Requires Transmission-Based Precautions: No Implantable device outside of the clinic excluding No Patient Has Alerts: Yes cellular tissue based products placed in the center Patient Alerts: [name] ABI non compressible since last visit: Has Dressing in Place as Prescribed: Yes Pain Present Now: No Electronic Signature(s) Signed: 03/09/2021 2:56:00 PM By: [name] [name] Entered By: [name] [name] on 03/09/2021 11:10:25 -------------------------------------------------------------------------------- Clinic Level of Care Assessment Details Patient Name: Date of Service: [name] [name], [name] [name]. 03/09/2021 10:45 A M Medical Record Number: [id number] Patient Account Number: [id number] Date of Birth/Sex: Treating RN: 1945/10/07 (76 y.o. [name] [name], [name] [name] Primary Care [name] [name]: [name] [name] Other Clinician: Referring [name] [name]: Treating Singleton Hickox/Extender: [name] [name], [name] [name] in Treatment: 166 Clinic Level of Care Assessment Items TOOL 4  Quantity Score X- 1 0 Use when only an EandM is performed on FOLLOW-UP visit ASSESSMENTS - Nursing Assessment / Reassessment X- 1 10 Reassessment of Co-morbidities (includes updates in patient status) X- 1 5 Reassessment of Adherence to Treatment Plan ASSESSMENTS - Wound and Skin A ssessment / Reassessment [] - 0 Simple Wound Assessment / Reassessment - one wound X- 3 5 Complex Wound Assessment / Reassessment - multiple wounds [] - 0 Dermatologic / Skin Assessment (not related to wound area) ASSESSMENTS - Focused Assessment [] - 0 Circumferential Edema Measurements - multi extremities [] - 0 Nutritional Assessment / Counseling / Intervention X- 1 5 Lower Extremity Assessment (monofilament, tuning fork, pulses) [] - 0 Peripheral Arterial Disease Assessment (using hand held doppler) ASSESSMENTS - Ostomy and/or Continence Assessment and Care [] - 0 Incontinence Assessment and Management [] - 0 Ostomy Care Assessment and Management (repouching, etc.) PROCESS - Coordination of Care X - Simple Patient / Family Education for ongoing care 1 15 [] - 0 Complex (extensive) Patient / Family Education for ongoing care X- 1 10 Staff obtains Programmer, systems, Records, T Results / Process Orders est X- 1 10 Staff telephones HHA, Nursing Homes / Clarify orders / etc [] - 0 Routine Transfer to another Facility (non-emergent condition) [] - 0 Routine Hospital Admission (non-emergent condition) [] - 0 New Admissions / Biomedical engineer / Ordering NPWT Apligraf, etc. , [] - 0 Emergency Hospital Admission (emergent condition) X- 1 10 Simple Discharge Coordination [] - 0 Complex (extensive) Discharge Coordination PROCESS - Special Needs [] - 0 Pediatric / Minor Patient Management [] - 0 Isolation Patient Management [] - 0 Hearing / Language / Visual special needs [] - 0 Assessment of Community assistance (transportation, D/C planning, etc.) [] - 0 Additional assistance / Altered  mentation [] - 0 Support Surface(s) Assessment (bed, cushion, seat, etc.) INTERVENTIONS - Wound Cleansing / Measurement [] - 0 Simple Wound Cleansing - one wound X-  3 5 °Complex Wound Cleansing - multiple wounds °X- 1 5 °Wound Imaging (photographs - any number of wounds) °[] - 0 °Wound Tracing (instead of photographs) °[] - 0 °Simple Wound Measurement - one wound °X- 3 5 °Complex Wound Measurement - multiple wounds °INTERVENTIONS - Wound Dressings °X - Small Wound Dressing one or multiple wounds 1 10 °[] - 0 °Medium Wound Dressing one or multiple wounds °X- 2 20 °Large Wound Dressing one or multiple wounds °[] - 0 °Application of Medications - topical °[] - 0 °Application of Medications - injection °INTERVENTIONS - Miscellaneous °[] - 0 °External ear exam °[] - 0 °Specimen Collection (cultures, biopsies, blood, body fluids, etc.) °[] - 0 °Specimen(s) / Culture(s) sent or taken to Lab for analysis °[] - 0 °Patient Transfer (multiple staff / Hoyer Lift / Similar devices) °[] - 0 °Simple Staple / Suture removal (25 or less) °[] - 0 °Complex Staple / Suture removal (26 or more) °[] - 0 °Hypo / Hyperglycemic Management (close monitor of Blood Glucose) °[] - 0 °Ankle / Brachial Index (ABI) - do not check if billed separately °X- 1 5 °Vital Signs °Has the patient been seen at the hospital within the last three years: Yes °Total Score: 170 °Level Of Care: New/Established - Level 5 °Electronic Signature(s) °Signed: 03/09/2021 5:53:09 PM By: Lynch, Shatara RN, BSN °Entered By: [name] [name] on 03/09/2021 17:12:14 °-------------------------------------------------------------------------------- °Encounter Discharge Information Details °Patient Name: Date of Service: °[name] [name], [name] [name]. 03/09/2021 10:45 A M °Medical Record Number: [id number] °Patient Account Number: [id number] °Date of Birth/Sex: Treating RN: °06/20/1945 (76 y.o. M) [name] [name] °Primary Care : [name] [name] Other Clinician: °Referring  : °Treating /Extender: [name] [name] °[name] [name] °[name] in Treatment: 166 °Encounter Discharge Information Items °Discharge Condition: Stable °Ambulatory Status: Stretcher °Discharge Destination: Home °Transportation: Private Auto °Accompanied By: transport °Schedule Follow-up Appointment: Yes °Clinical Summary of Care: Patient Declined °Electronic Signature(s) °Signed: 03/09/2021 5:53:09 PM By: Lynch, Shatara RN, BSN °Entered By: [name] [name] on 03/09/2021 17:13:09 °-------------------------------------------------------------------------------- °Lower Extremity Assessment Details °Patient Name: Date of Service: °[name] [name], [name] [name]. 03/09/2021 10:45 A M °Medical Record Number: [id number] °Patient Account Number: [id number] °Date of Birth/Sex: Treating RN: °04/14/1945 (75 y.o. M) [name] [name] °Primary Care : [name] [name] Other Clinician: °Referring : °Treating /Extender: [name] [name] °[name] [name] °[name] in Treatment: 166 °Edema Assessment °Assessed: [Left: No] [Right: No] °[Left: Edema] [Right: :] °Calf °Left: Right: °Point of Measurement: From Medial Instep 31.5 cm 30 cm °Ankle °Left: Right: °Point of Measurement: From Medial Instep 18.5 cm 17 cm °Electronic Signature(s) °Signed: 03/09/2021 5:53:09 PM By: Lynch, Shatara RN, BSN °Entered By: [name] [name] on 03/09/2021 11:24:15 °-------------------------------------------------------------------------------- °Multi Wound Chart Details °Patient Name: °Date of Service: °[name] [name], [name] [name]. 03/09/2021 10:45 A M °Medical Record Number: [id number] °Patient Account Number: [id number] °Date of Birth/Sex: °Treating RN: °02/05/1945 (75 y.o. M) [name] [name] °Primary Care : [name] [name] °Other Clinician: °Referring : °Treating /Extender: [name] [name] °[name] [name] °[name] in Treatment: 166 °Vital Signs °Height(in): 67 °Pulse(bpm): 80 °Weight(lbs): 130 °Blood Pressure(mmHg): 118/77 °Body Mass  Index(BMI): 20.4 °Temperature(Â°F): 98.1 °Respiratory Rate(breaths/min): 18 °Photos: °Right Elbow Left, Anterior Lower Leg Right, Anterior Lower Leg °Wound Location: °Pressure Injury Gradually Appeared Gradually Appeared °Wounding Event: °Pressure Ulcer Lymphedema Lymphedema °Primary Etiology: °Anemia, Hypertension, Type II Anemia, Hypertension, Type II Anemia, Hypertension, Type II °Comorbid History: °Diabetes, Paraplegia Diabetes, Paraplegia Diabetes, Paraplegia °05/26/2020 09/14/2020 09/14/2020 °Date Acquired: °41 25 25 °[name] of Treatment: °Open Open Open °Wound Status: °No   No No °Wound Recurrence: °2x2.2x0.2 1.5x0.5x0.1 0.2x0.2x0.1 °Measurements L x W x D (cm) °3.456 0.589 0.031 °A (cm²) : °rea °0.691 0.059 0.003 °Volume (cm³) : °55.50% 99.90% 100.00% °% Reduction in Area: °91.10% 99.90% 100.00% °% Reduction in Volume: °Category/Stage IV Full Thickness Without Exposed Full Thickness Without Exposed °Classification: °Support Structures Support Structures °Medium Medium Medium °Exudate Amount: °Serosanguineous Serosanguineous Serosanguineous °Exudate Type: °red, brown red, brown red, brown °Exudate Color: °Well defined, not attached Flat and Intact Flat and Intact °Wound Margin: °Large (67-100%) Large (67-100%) Large (67-100%) °Granulation Amount: °Pink Pink Pink °Granulation Quality: °Small (1-33%) None Present (0%) None Present (0%) °Necrotic Amount: °Fat Layer (Subcutaneous Tissue): Yes Fat Layer (Subcutaneous Tissue): Yes Fat Layer (Subcutaneous Tissue): Yes °Exposed Structures: °Fascia: No °Fascia: No °Fascia: No °Tendon: No °Tendon: No °Tendon: No °Muscle: No °Muscle: No °Muscle: No °Joint: No °Joint: No °Joint: No °Bone: No °Bone: No °Bone: No °Small (1-33%) Large (67-100%) Large (67-100%) °Epithelialization: °Treatment Notes °Electronic Signature(s) °Signed: 03/09/2021 5:03:55 PM By: Robson, Michael MD °Signed: 03/09/2021 5:53:09 PM By: Lynch, Shatara RN, BSN °Entered By: [name] [name] on 03/09/2021  11:36:16 °-------------------------------------------------------------------------------- °Multi-Disciplinary Care Plan Details °Patient Name: °Date of Service: °[name] [name], [name] [name]. 03/09/2021 10:45 A M °Medical Record Number: [id number] °Patient Account Number: [id number] °Date of Birth/Sex: °Treating RN: °12/07/1945 (75 y.o. M) [name] [name] °Primary Care : [name] [name] °Other Clinician: °Referring : °Treating /Extender: [name] [name] °[name] [name] °[name] in Treatment: 166 °Multidisciplinary Care Plan reviewed with physician °Active Inactive °Wound/Skin Impairment °Nursing Diagnoses: °Knowledge deficit related to ulceration/compromised skin integrity °Goals: °Patient/caregiver will verbalize understanding of skin care regimen °Date Initiated: 05/20/2019 °Target Resolution Date: 04/07/2021 °Goal Status: Active °Ulcer/skin breakdown will have a volume reduction of 30% by week 4 °Date Initiated: 12/31/2017 °Date Inactivated: 05/20/2019 °Target Resolution Date: 06/18/2018 °Goal Status: Met °Interventions: °Assess patient/caregiver ability to obtain necessary supplies °Assess patient/caregiver ability to perform ulcer/skin care regimen upon admission and as needed °Assess ulceration(s) every visit °Notes: °Electronic Signature(s) °Signed: 03/09/2021 5:53:09 PM By: Lynch, Shatara RN, BSN °Entered By: [name] [name] on 03/09/2021 11:28:56 °-------------------------------------------------------------------------------- °Pain Assessment Details °Patient Name: °Date of Service: °[name] [name], [name] [name]. 03/09/2021 10:45 A M °Medical Record Number: [id number] °Patient Account Number: [id number] °Date of Birth/Sex: °Treating RN: °04/07/1945 (75 y.o. M) [name] [name] °Primary Care : [name] [name] °Other Clinician: °Referring : °Treating /Extender: [name] [name] °[name] [name] °[name] in Treatment: 166 °Active Problems °Location of Pain Severity and Description of Pain °Patient Has Paino  No °Site Locations °Pain Management and Medication °Current Pain Management: °Electronic Signature(s) °Signed: 03/09/2021 2:56:00 PM By: Dawkins, Destiny °Signed: 03/09/2021 5:53:09 PM By: Lynch, Shatara RN, BSN °Entered By: Dawkins, Destiny on 03/09/2021 11:10:53 °-------------------------------------------------------------------------------- °Patient/Caregiver Education Details °Patient Name: °Date of Service: °[name] [name], [name] [name]. 2/23/2023andnbsp10:45 A M °Medical Record Number: [id number] °Patient Account Number: [id number] °Date of Birth/Gender: °Treating RN: °01/22/1945 (75 y.o. M) [name] [name] °Primary Care Physician: [name] [name] °Other Clinician: °Referring Physician: °Treating Physician/Extender: [name] [name] °[name] [name] °[name] in Treatment: 166 °Education Assessment °Education Provided To: °Patient °Education Topics Provided °Wound/Skin Impairment: °Methods: Explain/Verbal °Responses: State content correctly °Electronic Signature(s) °Signed: 03/09/2021 5:53:09 PM By: Lynch, Shatara RN, BSN °Entered By: [name] [name] on 03/09/2021 11:29:31 °-------------------------------------------------------------------------------- °Wound Assessment Details °Patient Name: °Date of Service: °[name] [name], [name] [name]. 03/09/2021 10:45 A M °Medical Record Number: [id number] °Patient Account Number: [id number] °Date of Birth/Sex: °Treating RN: °01/24/1945 (75 y.o. M) [name] [name] °Primary Care : [name] [name] °Other Clinician: °Referring : °  Treating /Extender: [name] [name] °[name] [name] °[name] in Treatment: 166 °Wound Status °Wound Number: 23 °Primary Etiology: Pressure Ulcer °Wound Location: Right Elbow °Wound Status: Open °Wounding Event: Pressure Injury °Comorbid History: Anemia, Hypertension, Type II Diabetes, Paraplegia °Date Acquired: 05/26/2020 °[name] Of Treatment: 41 °Clustered Wound: No °Photos °Wound Measurements °Length: (cm) 2 °Width: (cm) 2.2 °Depth: (cm) 0.2 °Area: (cm²)  3.456 °Volume: (cm³) 0.691 °% Reduction in Area: 55.5% °% Reduction in Volume: 91.1% °Epithelialization: Small (1-33%) °Tunneling: No °Undermining: No °Wound Description °Classification: Category/Stage IV °Wound Margin: Well defined, not attached °Exudate Amount: Medium °Exudate Type: Serosanguineous °Exudate Color: red, brown °Foul Odor After Cleansing: No °Slough/Fibrino Yes °Wound Bed °Granulation Amount: Large (67-100%) Exposed Structure °Granulation Quality: Pink °Fascia Exposed: No °Necrotic Amount: Small (1-33%) °Fat Layer (Subcutaneous Tissue) Exposed: Yes °Necrotic Quality: Adherent Slough °Tendon Exposed: No °Muscle Exposed: No °Joint Exposed: No °Bone Exposed: No °Treatment Notes °Wound #23 (Elbow) Wound Laterality: Right °Cleanser °Wound Cleanser °Discharge Instruction: Cleanse the wound with wound cleanser prior to applying a clean dressing using gauze sponges, not tissue or cotton balls. °Peri-Wound Care °Topical °Primary Dressing °Hydrofera Blue Ready Foam, 2.5 x2.5 in °Discharge Instruction: Apply to wound bed as instructed °Secondary Dressing °Bordered Gauze, 4x4 in °Discharge Instruction: Apply over primary dressing as directed. °Secured With °Compression Wrap °Compression Stockings °Add-Ons °Electronic Signature(s) °Signed: 03/09/2021 5:53:09 PM By: Lynch, Shatara RN, BSN °Entered By: [name] [name] on 03/09/2021 11:24:43 °-------------------------------------------------------------------------------- °Wound Assessment Details °Patient Name: °Date of Service: °Laskin, [name] [name]. 03/09/2021 10:45 A M °Medical Record Number: [id number] °Patient Account Number: [id number] °Date of Birth/Sex: °Treating RN: °06/16/1945 (75 y.o. M) [name] [name] °Primary Care : [name] [name] °Other Clinician: °Referring : °Treating /Extender: [name] [name] °[name] [name] °[name] in Treatment: 166 °Wound Status °Wound Number: 24 °Primary Etiology: Lymphedema °Wound Location: Left, Anterior Lower  Leg °Wound Status: Open °Wounding Event: Gradually Appeared °Comorbid History: Anemia, Hypertension, Type II Diabetes, Paraplegia °Date Acquired: 09/14/2020 °[name] Of Treatment: 25 °Clustered Wound: No °Photos °Wound Measurements °Length: (cm) 1.5 °Width: (cm) 0.5 °Depth: (cm) 0.1 °Area: (cm²) 0.589 °Volume: (cm³) 0.059 °% Reduction in Area: 99.9% °% Reduction in Volume: 99.9% °Epithelialization: Large (67-100%) °Tunneling: No °Undermining: No °Wound Description °Classification: Full Thickness Without Exposed Support Structures °Wound Margin: Flat and Intact °Exudate Amount: Medium °Exudate Type: Serosanguineous °Exudate Color: red, brown °Foul Odor After Cleansing: No °Slough/Fibrino No °Wound Bed °Granulation Amount: Large (67-100%) Exposed Structure °Granulation Quality: Pink °Fascia Exposed: No °Necrotic Amount: None Present (0%) °Fat Layer (Subcutaneous Tissue) Exposed: Yes °Tendon Exposed: No °Muscle Exposed: No °Joint Exposed: No °Bone Exposed: No °Treatment Notes °Wound #24 (Lower Leg) Wound Laterality: Left, Anterior °Cleanser °Soap and Water °Discharge Instruction: May shower and wash wound with dial antibacterial soap and water prior to dressing change. °Peri-Wound Care °Sween Lotion (Moisturizing lotion) °Discharge Instruction: Apply moisturizing lotion as directed °Topical °Primary Dressing °Xeroform Occlusive Gauze Dressing, 4x4 in °Discharge Instruction: Apply to wound bed as instructed °Secondary Dressing °ABD Pad, 8x10 °Discharge Instruction: Apply over primary dressing as directed. °Secured With °Kerlix Roll Sterile, 4.5x3.1 (in/yd) °Discharge Instruction: Secure with Kerlix as directed. °3M Medipore Soft Cloth Surgical T 2x10 (in/yd) °ape °Discharge Instruction: Secure with tape as directed. °Compression Wrap °Compression Stockings °Add-Ons °Electronic Signature(s) °Signed: 03/09/2021 5:53:09 PM By: Lynch, Shatara RN, BSN °Entered By: [name] [name] on 03/09/2021  11:25:15 °-------------------------------------------------------------------------------- °Wound Assessment Details °Patient Name: °Date of Service: °Hern, [name] [name]. 03/09/2021 10:45 A M °Medical Record Number: [id number] °Patient Account Number: [id number] °Date of   Birth/Sex: Treating RN: October 23, 1945 (76 y.o. [name] [name] Primary Care Oseph Imburgia: [name] [name] Other Clinician: Referring Ciella Obi: Treating Zina Pitzer/Extender: [name] [name], [name] [name] in Treatment: 166 Wound Status Wound Number: 25 Primary Etiology: Lymphedema Wound Location: Right, Anterior Lower Leg Wound Status: Open Wounding Event: Gradually Appeared Comorbid History: Anemia, Hypertension, Type II Diabetes, Paraplegia Date Acquired: 09/14/2020 [name] Of Treatment: 25 Clustered Wound: No Photos Wound Measurements Length: (cm) 0.2 Width: (cm) 0.2 Depth: (cm) 0.1 Area: (cm) 0.031 Volume: (cm) 0.003 % Reduction in Area: 100% % Reduction in Volume: 100% Epithelialization: Large (67-100%) Tunneling: No Undermining: No Wound Description Classification: Full Thickness Without Exposed Support Structu Wound Margin: Flat and Intact Exudate Amount: Medium Exudate Type: Serosanguineous Exudate Color: red, brown res Foul Odor After Cleansing: No Slough/Fibrino No Wound Bed Granulation Amount: Large (67-100%) Exposed Structure Granulation Quality: Pink Fascia Exposed: No Necrotic Amount: None Present (0%) Fat Layer (Subcutaneous Tissue) Exposed: Yes Tendon Exposed: No Muscle Exposed: No Joint Exposed: No Bone Exposed: No Treatment Notes Wound #25 (Lower Leg) Wound Laterality: Right, Anterior Cleanser Soap and Water Discharge Instruction: May shower and wash wound with dial antibacterial soap and water prior to dressing change. Peri-Wound Care Sween Lotion (Moisturizing lotion) Discharge Instruction: Apply moisturizing lotion as directed Topical Primary Dressing Xeroform Occlusive Gauze  Dressing, 4x4 in Discharge Instruction: Apply to wound bed as instructed Secondary Dressing ABD Pad, 8x10 Discharge Instruction: Apply over primary dressing as directed. Secured With [employer], 4.5x3.1 (in/yd) Discharge Instruction: Secure with Kerlix as directed. 30M Medipore Soft Cloth Surgical T 2x10 (in/yd) ape Discharge Instruction: Secure with tape as directed. Compression Wrap Compression Stockings Add-Ons Electronic Signature(s) Signed: 03/09/2021 5:53:09 PM By: Levan Hurst RN, BSN Entered By: Levan Hurst on 03/09/2021 11:25:46 -------------------------------------------------------------------------------- Vitals Details Patient Name: Date of Service: [name], [name] [name]. 03/09/2021 10:45 A M Medical Record Number: [id number] Patient Account Number: [id number] Date of Birth/Sex: Treating RN: Dec 07, 1945 (76 y.o. [name] [name] Primary Care Miguel Medal: [name] [name] Other Clinician: Referring Rhyann Berton: Treating Jonnette Nuon/Extender: [name] [name], [name] [name] in Treatment: 166 Vital Signs Time Taken: 11:10 Temperature (F): 98.1 Height (in): 67 Pulse (bpm): 80 Weight (lbs): 130 Respiratory Rate (breaths/min): 18 Body Mass Index (BMI): 20.4 Blood Pressure (mmHg): 118/77 Reference Range: 80 - 120 mg / dl Electronic Signature(s) Signed: 03/09/2021 2:56:00 PM By: [name] [name] Entered By: [name] [name] on 03/09/2021 11:10:45

## 2021-03-14 NOTE — Progress Notes (Signed)
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 within the wound bed. There is no necrotic tissue within the wound bed. 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 Celine Ahr - ****75 MINUTES - STRETCHER**** Bathing/ Shower/ Hygiene: May shower and wash wound with soap and water. - wash and gently scrub both legs prior to apply bandage Edema Control - Lymphedema / SCD / Other: Elevate legs to the level of the heart or above for 30 minutes daily and/or when sitting, a frequency of: - throughout the day Off-Loading: Turn and reposition every 2 hours WOUND #23: - Elbow Wound Laterality: Right Cleanser: Wound Cleanser (Generic) Every Other Day/30 Days Discharge Instructions: Cleanse the wound with wound cleanser prior to applying a clean dressing using gauze sponges, not tissue or cotton balls. Prim Dressing: Hydrofera Blue Ready Foam, 2.5 x2.5 in (Generic) Every Other Day/30 Days ary Discharge Instructions: Apply to wound bed as instructed Secondary Dressing: Bordered Gauze, 4x4 in (Generic) Every Other Day/30 Days Discharge Instructions: Apply over primary dressing as directed. WOUND #24: - Lower Leg Wound Laterality: Left, Anterior Cleanser: Soap and Water 1 x Per  Day/30 Days Discharge Instructions: May shower and wash wound with dial antibacterial soap and water prior to dressing change. Peri-Wound Care: Sween Lotion (Moisturizing lotion) 1 x Per Day/30 Days Discharge Instructions: Apply moisturizing lotion as directed Prim Dressing: Xeroform Occlusive Gauze Dressing, 4x4 in 1 x Per Day/30 Days ary Discharge Instructions: Apply to wound bed as instructed Secondary Dressing: ABD Pad, 8x10 (Generic) 1 x Per Day/30 Days Discharge Instructions: Apply over primary dressing as directed. Secured With: [employer], 4.5x3.1 (in/yd) (Generic) 1 x Per Day/30 Days Discharge Instructions: Secure with Kerlix as directed. Secured With:  35M Medipore Public affairs consultant Surgical T 2x10 (in/yd) (Generic) 1 x Per Day/30 Days ape Discharge Instructions: Secure with tape as directed. WOUND #25: - Lower Leg Wound Laterality: Right, Anterior Cleanser: Soap and Water 1 x Per Day/30 Days Discharge Instructions: May shower and wash wound with dial antibacterial soap and water prior to dressing change. Peri-Wound Care: Sween Lotion (Moisturizing lotion) 1 x Per Day/30 Days Discharge Instructions: Apply moisturizing lotion as directed Prim Dressing: Xeroform Occlusive Gauze Dressing, 4x4 in 1 x Per Day/30 Days ary Discharge Instructions: Apply to wound bed as instructed Secondary Dressing: ABD Pad, 8x10 (Generic) 1 x Per Day/30 Days Discharge Instructions: Apply over primary dressing as directed. Secured With: [employer], 4.5x3.1 (in/yd) (Generic) 1 x Per Day/30 Days Discharge Instructions: Secure with Kerlix as directed. Secured With: 35M Medipore Public affairs consultant Surgical T 2x10 (in/yd) (Generic) 1 x Per Day/30 Days ape Discharge Instructions: Secure with tape as directed. #1 continue with Xeroform on his lower legs things look a lot better here. 2 he followed up with dermatology at [facility] I don't see he's been there in the 2 months since he was last in our clinic. 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 Silver collagen would be an Contractor) Signed: 03/09/2021 5:03:55 PM By: Linton Ham MD Entered By: Linton Ham on 03/09/2021 11:44:00 -------------------------------------------------------------------------------- SuperBill Details Patient Name: Date of Service: Regino Schultze RD R. 03/09/2021 Medical Record Number: [id number] Patient Account Number: [id number] Date of Birth/Sex: Treating RN: April 03, 1945 (76 y.o. 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 with unspecified severity L97.518 Non-pressure chronic ulcer of other part of right foot with other specified severity Facility Procedures CPT4 Code: 41324401 Description: 02725 - WOUND CARE VISIT-LEV 5 EST PT Modifier: Quantity: 1 Physician Procedures : CPT4 Code Description Modifier [id number] - WC PHYS LEVEL 3 - EST PT ICD-10 Diagnosis Description L97.909 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

## 2021-04-03 ENCOUNTER — Encounter (HOSPITAL_COMMUNITY): Payer: Self-pay | Admitting: Emergency Medicine

## 2021-04-03 ENCOUNTER — Emergency Department (HOSPITAL_COMMUNITY): Payer: Medicare Other

## 2021-04-03 ENCOUNTER — Emergency Department (HOSPITAL_COMMUNITY)
Admission: EM | Admit: 2021-04-03 | Discharge: 2021-04-03 | Disposition: A | Discharge status: Home / Self Care | Payer: Medicare Other | Attending: Emergency Medicine | Admitting: Emergency Medicine

## 2021-04-03 ENCOUNTER — Other Ambulatory Visit: Payer: Self-pay

## 2021-04-03 DIAGNOSIS — I10 Essential (primary) hypertension: Secondary | ICD-10-CM | POA: Diagnosis not present

## 2021-04-03 DIAGNOSIS — R1110 Vomiting, unspecified: Secondary | ICD-10-CM | POA: Diagnosis present

## 2021-04-03 DIAGNOSIS — E119 Type 2 diabetes mellitus without complications: Secondary | ICD-10-CM | POA: Diagnosis not present

## 2021-04-03 DIAGNOSIS — R791 Abnormal coagulation profile: Secondary | ICD-10-CM | POA: Diagnosis not present

## 2021-04-03 LAB — COMPREHENSIVE METABOLIC PANEL
ALT: 24 U/L (ref 0–44)
AST: 23 U/L (ref 15–41)
Albumin: 4 g/dL (ref 3.5–5.0)
Alkaline Phosphatase: 70 U/L (ref 38–126)
Anion gap: 8 (ref 5–15)
BUN: 48 mg/dL — ABNORMAL HIGH (ref 8–23)
CO2: 24 mmol/L (ref 22–32)
Calcium: 9.3 mg/dL (ref 8.9–10.3)
Chloride: 104 mmol/L (ref 98–111)
Creatinine, Ser: 0.84 mg/dL (ref 0.61–1.24)
GFR, Estimated: >60 mL/min (ref >60)
Glucose, Bld: 90 mg/dL (ref 70–99)
Potassium: 5.3 mmol/L — ABNORMAL HIGH (ref 3.5–5.1)
Sodium: 136 mmol/L (ref 135–145)
Total Bilirubin: 0.4 mg/dL (ref 0.3–1.2)
Total Protein: 8.2 g/dL — ABNORMAL HIGH (ref 6.5–8.1)

## 2021-04-03 LAB — CBC
HCT: 36.9 % — ABNORMAL LOW (ref 39.0–52.0)
Hemoglobin: 11.8 g/dL — ABNORMAL LOW (ref 13.0–17.0)
MCH: 31.9 pg (ref 26.0–34.0)
MCHC: 32 g/dL (ref 30.0–36.0)
MCV: 99.7 fL (ref 80.0–100.0)
Platelets: 282 10*3/uL (ref 150–400)
RBC: 3.7 MIL/uL — ABNORMAL LOW (ref 4.22–5.81)
RDW: 14.6 % (ref 11.5–15.5)
WBC: 4.9 10*3/uL (ref 4.0–10.5)
nRBC: 0 % (ref 0.0–0.2)

## 2021-04-03 LAB — PROTIME-INR
INR: 1 (ref 0.8–1.2)
Prothrombin Time: 13.6 seconds (ref 11.4–15.2)

## 2021-04-03 IMAGING — DX DG CHEST 1V PORT
1 series · 1 of 1 positions shown · non-contrast
Comparison: [DATE]

CLINICAL DATA: Vomiting

EXAM:
PORTABLE CHEST 1 VIEW

[chest ap]
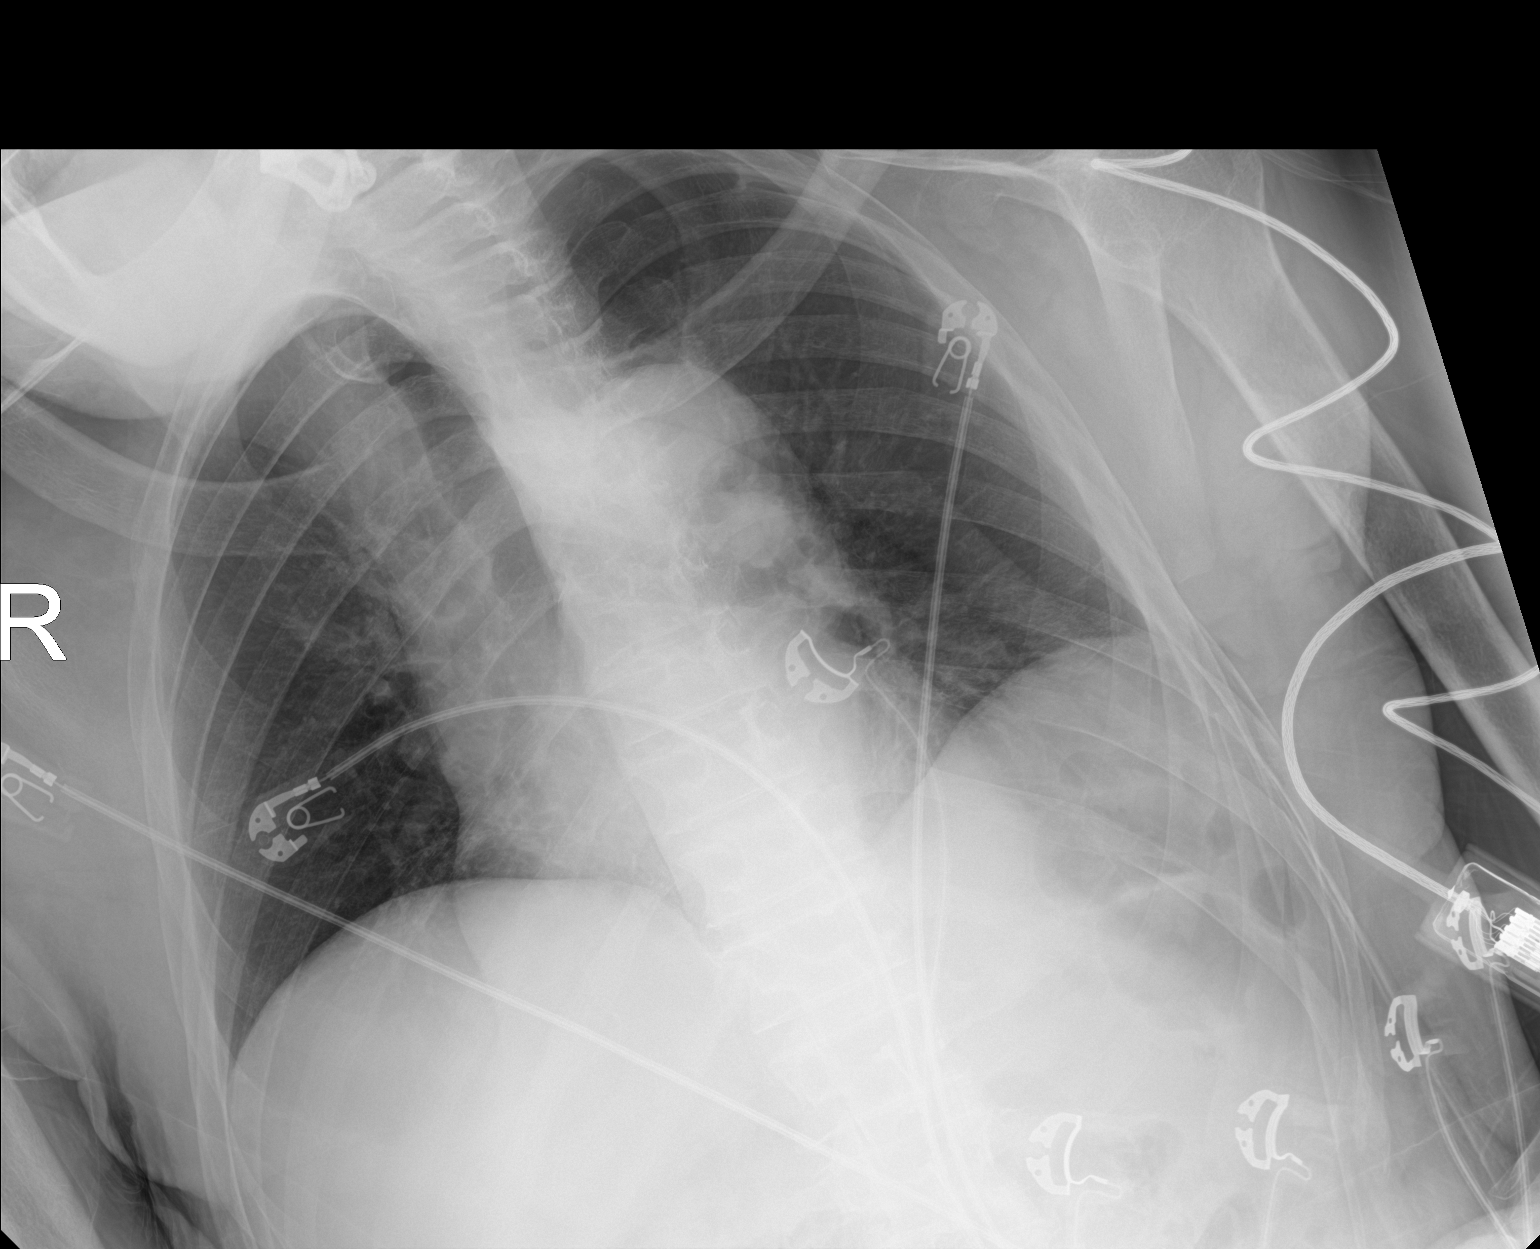

[1 of 1 positions shown; findings below may reference images not displayed]

FINDINGS: Lungs are clear.  No pleural effusion or pneumothorax.

The heart is normal in size.

Cervical spine fixation hardware.
IMPRESSION: No evidence of acute cardiopulmonary disease.

## 2021-04-03 NOTE — ED Notes (Signed)
[facility] called for transport.  

## 2021-04-03 NOTE — Discharge Instructions (Signed)
You were evaluated in the Emergency Department and after careful evaluation, we did not find any emergent condition requiring admission or further testing in the hospital.  Your exam/testing today was overall reassuring.  Please return to the Emergency Department if you experience any worsening of your condition.  Thank you for allowing us to be a part of your care.  
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
## 2021-04-03 NOTE — ED Triage Notes (Signed)
Pt BIB [facility] from [facility]. Per EMS, staff reports an episode of coffeeground emesis today. Pt denies N/V or coffeeground emesis. Has no complaints other than pain when people touch and mess with him.  ?

## 2021-05-04 ENCOUNTER — Encounter (HOSPITAL_BASED_OUTPATIENT_CLINIC_OR_DEPARTMENT_OTHER): Payer: Medicare Other | Admitting: General Surgery

## 2021-05-10 ENCOUNTER — Encounter (HOSPITAL_BASED_OUTPATIENT_CLINIC_OR_DEPARTMENT_OTHER): Payer: Medicare Other | Attending: General Surgery | Admitting: General Surgery

## 2021-05-10 DIAGNOSIS — S80829D Blister (nonthermal), unspecified lower leg, subsequent encounter: Secondary | ICD-10-CM | POA: Insufficient documentation

## 2021-05-10 DIAGNOSIS — E11622 Type 2 diabetes mellitus with other skin ulcer: Secondary | ICD-10-CM | POA: Diagnosis not present

## 2021-05-10 DIAGNOSIS — S68411D Complete traumatic amputation of right hand at wrist level, subsequent encounter: Secondary | ICD-10-CM | POA: Insufficient documentation

## 2021-05-10 DIAGNOSIS — G8250 Quadriplegia, unspecified: Secondary | ICD-10-CM | POA: Insufficient documentation

## 2021-05-10 DIAGNOSIS — S60811A Abrasion of right wrist, initial encounter: Secondary | ICD-10-CM | POA: Diagnosis not present

## 2021-05-10 DIAGNOSIS — B351 Tinea unguium: Secondary | ICD-10-CM | POA: Insufficient documentation

## 2021-05-10 DIAGNOSIS — X58XXXD Exposure to other specified factors, subsequent encounter: Secondary | ICD-10-CM | POA: Insufficient documentation

## 2021-05-10 DIAGNOSIS — L97909 Non-pressure chronic ulcer of unspecified part of unspecified lower leg with unspecified severity: Secondary | ICD-10-CM | POA: Diagnosis not present

## 2021-05-10 DIAGNOSIS — Z7984 Long term (current) use of oral hypoglycemic drugs: Secondary | ICD-10-CM | POA: Diagnosis not present
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
## 2021-05-10 NOTE — Progress Notes (Signed)
[name] [name], PINDER ([id number]) ?Visit Report for 05/10/2021 ?Arrival Information Details ?Patient Name: Date of Service: ?[name], [name] 05/10/2021 10:00 A M ?Medical Record Number: [id number] ?Patient Account Number: [id number] ?Date of Birth/Sex: Treating RN: ?[name] (76 y.o. Male) Levan Hurst ?Primary Care Coby Antrobus: Theodora Blow, MIKE Other Clinician: ?Referring Berlie Persky: ?Treating Eshawn Coor/Extender: Fredirick Maudlin ?[place], MIKE ?Weeks in Treatment: 175 ?Visit Information History Since Last Visit ?Added or deleted any medications: No ?Patient Arrived: Stretcher ?Any new allergies or adverse reactions: No ?Arrival Time: 09:59 ?Had a fall or experienced change in No ?Accompanied By: alone ?activities of daily living that may affect ?Transfer Assistance: Stretcher ?risk of falls: ?Patient Identification Verified: Yes ?Signs or symptoms of abuse/neglect since last visito No ?Secondary Verification Process Completed: Yes ?Hospitalized since last visit: No ?Patient Requires Transmission-Based Precautions: No ?Implantable device outside of the clinic excluding No ?Patient Has Alerts: Yes ?cellular tissue based products placed in the center ?Patient Alerts: R ABI non compressible since last visit: ?Has Dressing in Place as Prescribed: Yes ?Pain Present Now: Yes ?Electronic Signature(s) ?Signed: 05/10/2021 5:38:47 PM By: Levan Hurst RN, BSN ?Entered By: Levan Hurst on 05/10/2021 10:00:16 ?-------------------------------------------------------------------------------- ?Clinic Level of Care Assessment Details ?Patient Name: Date of Service: ?[name] [name], [name] 05/10/2021 10:00 A M ?Medical Record Number: [id number] ?Patient Account Number: [id number] ?Date of Birth/Sex: Treating RN: ?[name] [name] (76 y.o. Male) Levan Hurst ?Primary Care Alayziah Tangeman: Theodora Blow, MIKE Other Clinician: ?Referring Alexes Menchaca: ?Treating Elodia Haviland/Extender: Fredirick Maudlin ?[place], MIKE ?Weeks in Treatment: 175 ?Clinic Level of Care Assessment  Items ?TOOL 4 Quantity Score ?X- 1 0 ?Use when only an EandM is performed on FOLLOW-UP visit ?ASSESSMENTS - Nursing Assessment / Reassessment ?X- 1 10 ?Reassessment of Co-morbidities (includes updates in patient status) ?X- 1 5 ?Reassessment of Adherence to Treatment Plan ?ASSESSMENTS - Wound and Skin A ssessment / Reassessment ?[]  - 0 ?Simple Wound Assessment / Reassessment - one wound ?X- 3 5 ?Complex Wound Assessment / Reassessment - multiple wounds ?[]  - 0 ?Dermatologic / Skin Assessment (not related to wound area) ?ASSESSMENTS - Focused Assessment ?[]  - 0 ?Circumferential Edema Measurements - multi extremities ?[]  - 0 ?Nutritional Assessment / Counseling / Intervention ?X- 1 5 ?Lower Extremity Assessment (monofilament, tuning fork, pulses) ?[]  - 0 ?Peripheral Arterial Disease Assessment (using hand held doppler) ?ASSESSMENTS - Ostomy and/or Continence Assessment and Care ?[]  - 0 ?Incontinence Assessment and Management ?[]  - 0 ?Ostomy Care Assessment and Management (repouching, etc.) ?PROCESS - Coordination of Care ?X - Simple Patient / Family Education for ongoing care 1 15 ?[]  - 0 ?Complex (extensive) Patient / Family Education for ongoing care ?X- 1 10 ?Staff obtains Consents, Records, T Results / Process Orders ?est ?[]  - 0 ?Staff telephones HHA, Nursing Homes / Clarify orders / etc ?[]  - 0 ?Routine Transfer to another Facility (non-emergent condition) ?[]  - 0 ?Routine Hospital Admission (non-emergent condition) ?[]  - 0 ?New Admissions / Biomedical engineer / Ordering NPWT Apligraf, etc. ?, ?[]  - 0 ?Emergency Hospital Admission (emergent condition) ?X- 1 10 ?Simple Discharge Coordination ?[]  - 0 ?Complex (extensive) Discharge Coordination ?PROCESS - Special Needs ?[]  - 0 ?Pediatric / Minor Patient Management ?[]  - 0 ?Isolation Patient Management ?[]  - 0 ?Hearing / Language / Visual special needs ?[]  - 0 ?Assessment of Community assistance (transportation, D/C planning, etc.) ?[]  - 0 ?Additional  assistance / Altered mentation ?[]  - 0 ?Support Surface(s) Assessment (bed, cushion, seat, etc.) ?INTERVENTIONS - Wound Cleansing / Measurement ?[]  - 0 ?Simple Wound Cleansing - one  wound ?X- 3 5 ?Complex Wound Cleansing - multiple wounds ?X- 1 5 ?Wound Imaging (photographs - any number of wounds) ?[]  - 0 ?Wound Tracing (instead of photographs) ?[]  - 0 ?Simple Wound Measurement - one wound ?X- 3 5 ?Complex Wound Measurement - multiple wounds ?INTERVENTIONS - Wound Dressings ?X - Small Wound Dressing one or multiple wounds 1 10 ?X- 2 15 ?Medium Wound Dressing one or multiple wounds ?[]  - 0 ?Large Wound Dressing one or multiple wounds ?X- 1 5 ?Application of Medications - topical ?[]  - 0 ?Application of Medications - injection ?INTERVENTIONS - Miscellaneous ?[]  - 0 ?External ear exam ?[]  - 0 ?Specimen Collection (cultures, biopsies, blood, body fluids, etc.) ?[]  - 0 ?Specimen(s) / Culture(s) sent or taken to Lab for analysis ?X- 1 10 ?Patient Transfer (multiple staff / Civil Service fast streamer / Similar devices) ?[]  - 0 ?Simple Staple / Suture removal (25 or less) ?[]  - 0 ?Complex Staple / Suture removal (26 or more) ?[]  - 0 ?Hypo / Hyperglycemic Management (close monitor of Blood Glucose) ?[]  - 0 ?Ankle / Brachial Index (ABI) - do not check if billed separately ?X- 1 5 ?Vital Signs ?Has the patient been seen at the hospital within the last three years: Yes ?Total Score: 165 ?Level Of Care: New/Established - Level 5 ?Electronic Signature(s) ?Signed: 05/10/2021 5:38:47 PM By: Levan Hurst RN, BSN ?Entered By: Levan Hurst on 05/10/2021 11:59:55 ?-------------------------------------------------------------------------------- ?Encounter Discharge Information Details ?Patient Name: Date of Service: ?[name] [name], [name] 05/10/2021 10:00 A M ?Medical Record Number: [id number] ?Patient Account Number: [id number] ?Date of Birth/Sex: Treating RN: ?[name] [name] (76 y.o. Male) Levan Hurst ?Primary Care Allyah Heather: Theodora Blow, MIKE Other  Clinician: ?Referring Eriko Economos: ?Treating Lahela Woodin/Extender: Fredirick Maudlin ?[place], MIKE ?Weeks in Treatment: 175 ?Encounter Discharge Information Items ?Discharge Condition: Stable ?Ambulatory Status: Stretcher ?Discharge Destination: Home ?Transportation: Ambulance ?Accompanied By: EMS transport ?Schedule Follow-up Appointment: Yes ?Clinical Summary of Care: Patient Declined ?Electronic Signature(s) ?Signed: 05/10/2021 5:38:47 PM By: Levan Hurst RN, BSN ?Entered By: Levan Hurst on 05/10/2021 12:01:09 ?-------------------------------------------------------------------------------- ?Lower Extremity Assessment Details ?Patient Name: Date of Service: ?[place], [name] 05/10/2021 10:00 A M ?Medical Record Number: [id number] ?Patient Account Number: [id number] ?Date of Birth/Sex: Treating RN: ?[name] (76 y.o. Male) Levan Hurst ?Primary Care Katianne Barre: Theodora Blow, MIKE Other Clinician: ?Referring Sayward Horvath: ?Treating Nahom Carfagno/Extender: Fredirick Maudlin ?[place], MIKE ?Weeks in Treatment: 175 ?Edema Assessment ?Assessed: [Left: No] [Right: No] ?Edema: [Left: No] [Right: No] ?Calf ?Left: Right: ?Point of Measurement: From Medial Instep 31 cm 32 cm ?Ankle ?Left: Right: ?Point of Measurement: From Medial Instep 17.5 cm 20 cm ?Vascular Assessment ?Pulses: ?Dorsalis Pedis ?Palpable: [Left:Yes] [Right:Yes] ?Electronic Signature(s) ?Signed: 05/10/2021 5:38:47 PM By: Levan Hurst RN, BSN ?Entered By: Levan Hurst on 05/10/2021 10:05:03 ?-------------------------------------------------------------------------------- ?Multi Wound Chart Details ?Patient Name: ?Date of Service: ?[name] [name], [name] 05/10/2021 10:00 A M ?Medical Record Number: [id number] ?Patient Account Number: [id number] ?Date of Birth/Sex: ?Treating RN: ?[name] (76 y.o. Male) Levan Hurst ?Primary Care Jeilyn Reznik: Theodora Blow, MIKE ?Other Clinician: ?Referring Glennie Rodda: ?Treating Keandria Berrocal/Extender: Fredirick Maudlin ?[place], MIKE ?Weeks in Treatment:  175 ?Vital Signs ?Height(in): 67 ?Pulse(bpm): 88 ?Weight(lbs): 130 ?Blood Pressure(mmHg): 120/79 ?Body Mass Index(BMI): 20.4 ?Temperature(??F): 98.5 ?Respiratory Rate(breaths/min): 16 ?Photos: ?Right Elbow Left, Ante

## 2021-07-05 ENCOUNTER — Encounter (HOSPITAL_BASED_OUTPATIENT_CLINIC_OR_DEPARTMENT_OTHER): Payer: Medicare Other | Attending: General Surgery | Admitting: General Surgery

## 2021-07-05 DIAGNOSIS — E1151 Type 2 diabetes mellitus with diabetic peripheral angiopathy without gangrene: Secondary | ICD-10-CM | POA: Insufficient documentation

## 2021-07-05 DIAGNOSIS — I10 Essential (primary) hypertension: Secondary | ICD-10-CM | POA: Insufficient documentation

## 2021-07-05 DIAGNOSIS — G8220 Paraplegia, unspecified: Secondary | ICD-10-CM | POA: Diagnosis not present

## 2021-07-05 DIAGNOSIS — L8990 Pressure ulcer of unspecified site, unspecified stage: Secondary | ICD-10-CM | POA: Diagnosis not present

## 2021-07-05 DIAGNOSIS — I890 Lymphedema, not elsewhere classified: Secondary | ICD-10-CM | POA: Insufficient documentation

## 2021-08-30 ENCOUNTER — Encounter (HOSPITAL_BASED_OUTPATIENT_CLINIC_OR_DEPARTMENT_OTHER): Payer: Medicare Other | Attending: General Surgery | Admitting: General Surgery

## 2021-08-30 DIAGNOSIS — L89013 Pressure ulcer of right elbow, stage 3: Secondary | ICD-10-CM | POA: Insufficient documentation

## 2021-08-30 DIAGNOSIS — S68411D Complete traumatic amputation of right hand at wrist level, subsequent encounter: Secondary | ICD-10-CM | POA: Diagnosis not present

## 2021-08-30 DIAGNOSIS — S60811A Abrasion of right wrist, initial encounter: Secondary | ICD-10-CM | POA: Diagnosis not present

## 2021-08-30 DIAGNOSIS — L97909 Non-pressure chronic ulcer of unspecified part of unspecified lower leg with unspecified severity: Secondary | ICD-10-CM | POA: Diagnosis not present

## 2021-08-30 DIAGNOSIS — Z8673 Personal history of transient ischemic attack (TIA), and cerebral infarction without residual deficits: Secondary | ICD-10-CM | POA: Diagnosis not present

## 2021-08-30 DIAGNOSIS — S80829D Blister (nonthermal), unspecified lower leg, subsequent encounter: Secondary | ICD-10-CM | POA: Insufficient documentation

## 2021-08-30 DIAGNOSIS — R54 Age-related physical debility: Secondary | ICD-10-CM | POA: Insufficient documentation

## 2021-08-30 DIAGNOSIS — E11621 Type 2 diabetes mellitus with foot ulcer: Secondary | ICD-10-CM | POA: Diagnosis not present

## 2021-08-30 DIAGNOSIS — G8250 Quadriplegia, unspecified: Secondary | ICD-10-CM | POA: Insufficient documentation

## 2021-08-31 NOTE — Progress Notes (Signed)
YUAN, GANN ([id number]) Visit Report for 08/30/2021 Arrival Information Details Patient Name: Date of Service: GEARHART, [place] RD R. 08/30/2021 10:45 A M Medical Record Number: [id number] Patient Account Number: [id number] Date of Birth/Sex: Treating RN: Sep 18, 1945 (76 y.o. [name] Primary Care Keimya Briddell: Theodora Blow, MIKE Other Clinician: Referring Dan Dissinger: Treating Kemba Hoppes/Extender: Sharlyn Bologna, MIKE Weeks in Treatment: 39 Visit Information History Since Last Visit Added or deleted any medications: No Patient Arrived: Stretcher Any new allergies or adverse reactions: No Arrival Time: 10:52 Had a fall or experienced change in No Accompanied By: self activities of daily living that may affect Transfer Assistance: Stretcher risk of falls: Patient Identification Verified: Yes Signs or symptoms of abuse/neglect since last visito No Secondary Verification Process Completed: Yes Hospitalized since last visit: No Patient Requires Transmission-Based Precautions: No Implantable device outside of the clinic excluding No Patient Has Alerts: Yes cellular tissue based products placed in the center Patient Alerts: R ABI non compressible since last visit: Has Dressing in Place as Prescribed: Yes Pain Present Now: Yes Electronic Signature(s) Signed: 08/30/2021 4:13:50 PM By: Adline Peals Entered By: Adline Peals on 08/30/2021 10:58:16 -------------------------------------------------------------------------------- Clinic Level of Care Assessment Details Patient Name: Date of Service: HAYTHAM, MAHER RD R. 08/30/2021 10:45 A M Medical Record Number: [id number] Patient Account Number: [id number] Date of Birth/Sex: Treating RN: 06-18-1945 (76 y.o. [name] Primary Care Julionna Marczak: Theodora Blow, MIKE Other Clinician: Referring Jesyca Weisenburger: Treating Samarth Ogle/Extender: Sharlyn Bologna, MIKE Weeks in Treatment: 191 Clinic Level of Care  Assessment Items TOOL 4 Quantity Score X- 1 0 Use when only an EandM is performed on FOLLOW-UP visit ASSESSMENTS - Nursing Assessment / Reassessment X- 1 10 Reassessment of Co-morbidities (includes updates in patient status) X- 1 5 Reassessment of Adherence to Treatment Plan ASSESSMENTS - Wound and Skin A ssessment / Reassessment _0  - 0 Simple Wound Assessment / Reassessment - one wound X- 3 5 Complex Wound Assessment / Reassessment - multiple wounds _1  - 0 Dermatologic / Skin Assessment (not related to wound area) ASSESSMENTS - Focused Assessment X- 1 5 Circumferential Edema Measurements - multi extremities _2  - 0 Nutritional Assessment / Counseling / Intervention X- 1 5 Lower Extremity Assessment (monofilament, tuning fork, pulses) _3  - 0 Peripheral Arterial Disease Assessment (using hand held doppler) ASSESSMENTS - Ostomy and/or Continence Assessment and Care _4  - 0 Incontinence Assessment and Management _5  - 0 Ostomy Care Assessment and Management (repouching, etc.) PROCESS - Coordination of Care X - Simple Patient / Family Education for ongoing care 1 15 _6  - 0 Complex (extensive) Patient / Family Education for ongoing care X- 1 10 Staff obtains Programmer, systems, Records, T Results / Process Orders est _7  - 0 Staff telephones HHA, Nursing Homes / Clarify orders / etc _8  - 0 Routine Transfer to another Facility (non-emergent condition) _9  - 0 Routine Hospital Admission (non-emergent condition) _10  - 0 New Admissions / Biomedical engineer / Ordering NPWT Apligraf, etc. , _11  - 0 Emergency Hospital Admission (emergent condition) X- 1 10 Simple Discharge Coordination _12  - 0 Complex (extensive) Discharge Coordination PROCESS - Special Needs _13  - 0 Pediatric / Minor Patient Management _14  - 0 Isolation Patient Management _15  - 0 Hearing / Language / Visual special needs _16  - 0 Assessment of Community assistance (transportation, D/C planning, etc.) _17  -  0 Additional assistance / Altered mentation _18  - 0 Support Surface(s) Assessment (bed, cushion, seat, etc.) INTERVENTIONS - Wound Cleansing / Measurement X - Simple Wound Cleansing - one wound 1 5 _19  -  0 Complex Wound Cleansing - multiple wounds X- 1 5 Wound Imaging (photographs - any number of wounds) _0  - 0 Wound Tracing (instead of photographs) X- 1 5 Simple Wound Measurement - one wound _1  - 0 Complex Wound Measurement - multiple wounds INTERVENTIONS - Wound Dressings X - Small Wound Dressing one or multiple wounds 3 10 _2  - 0 Medium Wound Dressing one or multiple wounds _3  - 0 Large Wound Dressing one or multiple wounds <[id number]>_0<\/FVCBSWHQPRFFMBWG>_6  - 0 Application of Medications - topical <[id number]>_0<\/[id number]>_4  - 0 Application of Medications - injection INTERVENTIONS - Miscellaneous _6  - 0 External ear exam _7  - 0 Specimen Collection (cultures, biopsies, blood, body fluids, etc.) _8  - 0 Specimen(s) / Culture(s) sent or taken to Lab for analysis _9  - 0 Patient Transfer (multiple staff / Civil Service fast streamer / Similar devices) _10  - 0 Simple Staple / Suture removal (25 or less) _11  - 0 Complex Staple / Suture removal (26 or more) _12  - 0 Hypo / Hyperglycemic Management (close monitor of Blood Glucose) _13  - 0 Ankle / Brachial Index (ABI) - do not check if billed separately X- 1 5 Vital Signs Has the patient been seen at the hospital within the last three years: Yes Total Score: 125 Level Of Care: New/Established - Level 4 Electronic Signature(s) Signed: 08/30/2021 4:13:50 PM By: Adline Peals Entered By: Adline Peals on 08/30/2021 11:46:10 -------------------------------------------------------------------------------- Encounter Discharge Information Details Patient Name: Date of Service: [name], [name] 08/30/2021 10:45 A M Medical Record Number: [id number] Patient Account Number: [id number] Date of Birth/Sex: Treating RN: 11-06-1945 (76 y.o. [name] Primary Care Jazmeen Axtell: Theodora Blow, MIKE  Other Clinician: Referring Nicosha Struve: Treating Ottie Tillery/Extender: Sharlyn Bologna, MIKE Weeks in Treatment: [phone] Encounter Discharge Information Items Discharge Condition: Stable Ambulatory Status: Stretcher Discharge Destination: Home Transportation: Ambulance Accompanied By: self Schedule Follow-up Appointment: Yes Clinical Summary of Care: Patient Declined Electronic Signature(s) Signed: 08/30/2021 4:13:50 PM By: Sabas Sous By: Adline Peals on 08/30/2021 11:35:32 -------------------------------------------------------------------------------- Lower Extremity Assessment Details Patient Name: Date of Service: SHARIQ, PUIG RD R. 08/30/2021 10:45 A M Medical Record Number: [id number] Patient Account Number: [id number] Date of Birth/Sex: Treating RN: May 14, 1945 (76 y.o. [name] Primary Care Riven Mabile: Theodora Blow, MIKE Other Clinician: Referring Purity Irmen: Treating Blessin Kanno/Extender: Sharlyn Bologna, MIKE Weeks in Treatment: 191 Edema Assessment Assessed: [Left: No] [Right: No] Edema: [Left: No] [Right: No] Calf Left: Right: Point of Measurement: From Medial Instep 36.6 cm 38.4 cm Ankle Left: Right: Point of Measurement: From Medial Instep 21.8 cm 19 cm Vascular Assessment Pulses: Dorsalis Pedis Palpable: [Left:Yes] [Right:Yes] Electronic Signature(s) Signed: 08/30/2021 4:13:50 PM By: Adline Peals Entered By: Adline Peals on 08/30/2021 11:06:10 -------------------------------------------------------------------------------- Multi Wound Chart Details Patient Name: Date of Service: Regino Schultze RD R. 08/30/2021 10:45 A M Medical Record Number: [id number] Patient Account Number: [id number] Date of Birth/Sex: Treating RN: [birth year] (76 y.o. [name] Primary Care Marieelena Bartko: Theodora Blow, MIKE Other Clinician: Referring Tiena Manansala: Treating Kyrah Schiro/Extender: Sharlyn Bologna, MIKE Weeks in  Treatment: 191 Vital Signs Height(in): 67 Pulse(bpm): 25 Weight(lbs): 130 Blood Pressure(mmHg): 164/91 Body Mass Index(BMI): 20.4 Temperature(F): 97.8 Respiratory Rate(breaths/min): 18 Photos: [24:No Photos Left, Anterior Lower Leg] [25:No Photos Right, Anterior Lower Leg] [29:No Photos Right Wrist] Wound Location: [24:Gradually Appeared] [25:Gradually Appeared] [29:Gradually Appeared] Wounding Event: [24:Lymphedema] [25:Lymphedema] [29:Pressure Ulcer] Primary Etiology: [24:Anemia, Hypertension, Type II] [25:Anemia, Hypertension, Type II] [29:Anemia, Hypertension, Type II] Comorbid History: [24:Diabetes, Paraplegia 09/14/2020] [25:Diabetes, Paraplegia 09/14/2020] [29:Diabetes, Paraplegia 05/10/2021] Date Acquired: [24:50] [25:50] [29:16] Weeks of  Treatment: [24:Open] [25:Open] [29:Open] Wound Status: [24:No] [25:No] [29:No] Wound Recurrence: [24:0x0x0] [25:0x0x0] [29:0x0x0] Measurements L x W x D (cm) [24:0] [25:0] [29:0] A (cm) : rea [24:0] [25:0] [29:0] Volume (cm) : [24:100.00%] [25:100.00%] [29:100.00%] % Reduction in Area: [24:100.00%] [25:100.00%] [29:100.00%] % Reduction in Volume: [24:Full Thickness Without Exposed] [25:Full Thickness Without Exposed] [29:Category/Stage II] Classification: [24:Support Structures None Present] [25:Support Structures None Present] [29:None Present] Exudate Amount: [24:N/A] [25:N/A] [29:N/A] Exudate Type: [24:N/A] [25:N/A] [29:N/A] Exudate Color: [24:Flat and Intact] [25:Flat and Intact] [29:Flat and Intact] Wound Margin: [24:None Present (0%)] [25:None Present (0%)] [29:None Present (0%)] Granulation Amount: [24:N/A] [25:N/A] [29:N/A] Granulation Quality: [24:None Present (0%)] [25:None Present (0%)] [29:None Present (0%)] Necrotic Amount: [24:Fascia: No] [25:Fascia: No] [29:Fascia: No] Exposed Structures: [24:Fat Layer (Subcutaneous Tissue): No Tendon: No Muscle: No Joint: No Bone: No Large (67-100%)] [25:Fat Layer (Subcutaneous Tissue):  No Tendon: No Muscle: No Joint: No Bone: No Large (67-100%)] [29:Fat Layer (Subcutaneous Tissue): No Tendon: No Muscle:  No Joint: No Bone: No Large (67-100%)] Wound Number: [id number] Photos: No Photos No Photos No Photos Left, Medial Lower Leg Right, Lateral Lower Leg Right Elbow Wound Location: Skin Tear/Laceration Skin Tear/Laceration Pressure Injury Wounding Event: Skin Tear Skin Tear Pressure Ulcer Primary Etiology: Anemia, Hypertension, Type II Anemia, Hypertension, Type II Anemia, Hypertension, Type II Comorbid History: Diabetes, Paraplegia Diabetes, Paraplegia Diabetes, Paraplegia 08/30/2021 08/30/2021 08/30/2021 Date Acquired: 0 0 0 Weeks of Treatment: Open Open Open Wound Status: No No No Wound Recurrence: 0.3x0.2x0.1 3.5x0.3x0.1 1x1.5x0.2 Measurements L x W x D (cm) 0.047 0.825 1.178 A (cm) : rea 0.005 0.082 0.236 Volume (cm) : N/A N/A N/A % Reduction in Area: N/A N/A N/A % Reduction in Volume: Full Thickness Without Exposed Full Thickness Without Exposed Category/Stage III Classification: Support Structures Support Structures Small Small Medium Exudate Amount: Serous Serosanguineous Serosanguineous Exudate Type: amber red, brown red, brown Exudate Color: Distinct, outline attached Distinct, outline attached Distinct, outline attached Wound Margin: Large (67-100%) Large (67-100%) Small (1-33%) Granulation Amount: Red Red Red Granulation Quality: None Present (0%) None Present (0%) Large (67-100%) Necrotic Amount: Fat Layer (Subcutaneous Tissue): Yes Fat Layer (Subcutaneous Tissue): Yes Fat Layer (Subcutaneous Tissue): Yes Exposed Structures: Fascia: No Fascia: No Fascia: No Tendon: No Tendon: No Tendon: No Muscle: No Muscle: No Muscle: No Joint: No Joint: No Joint: No Bone: No Bone: No Bone: No Small (1-33%) Small (1-33%) None Epithelialization: Treatment Notes Wound #24 (Lower Leg) Wound Laterality: Left, Anterior Cleanser Peri-Wound  Care Topical Primary Dressing Secondary Dressing Secured With Compression Wrap Compression Stockings Add-Ons Wound #25 (Lower Leg) Wound Laterality: Right, Anterior Cleanser Peri-Wound Care Topical Primary Dressing Secondary Dressing Secured With Compression Wrap Compression Stockings Add-Ons Wound #29 (Wrist) Wound Laterality: Right Cleanser Peri-Wound Care Topical Primary Dressing Secondary Dressing Secured With Compression Wrap Compression Stockings Add-Ons Wound #30 (Lower Leg) Wound Laterality: Left, Medial Cleanser Soap and Water Discharge Instruction: May shower and wash wound with dial antibacterial soap and water prior to dressing change. Wound Cleanser Discharge Instruction: Cleanse the wound with wound cleanser prior to applying a clean dressing using gauze sponges, not tissue or cotton balls. Peri-Wound Care Triamcinolone 15 (g) Discharge Instruction: Use triamcinolone 15 (g) as directed Sween Lotion (Moisturizing lotion) Discharge Instruction: Apply moisturizing lotion as directed Topical Primary Dressing KerraCel Ag Gelling Fiber Dressing, 2x2 in (silver alginate) Discharge Instruction: Apply silver alginate to wound bed as instructed Secondary Dressing ABD Pad, 5x9 Discharge Instruction: Apply over primary dressing as directed. Secured With [employer], 4.5x3.1 (in/yd)  Discharge Instruction: Secure with Kerlix as directed. 8M Medipore Soft Cloth Surgical T 2x10 (in/yd) ape Discharge Instruction: Secure with tape as directed. Compression Wrap Compression Stockings Add-Ons Wound #31 (Lower Leg) Wound Laterality: Right, Lateral Cleanser Soap and Water Discharge Instruction: May shower and wash wound with dial antibacterial soap and water prior to dressing change. Wound Cleanser Discharge Instruction: Cleanse the wound with wound cleanser prior to applying a clean dressing using gauze sponges, not tissue or cotton balls. Peri-Wound  Care Triamcinolone 15 (g) Discharge Instruction: Use triamcinolone 15 (g) as directed Sween Lotion (Moisturizing lotion) Discharge Instruction: Apply moisturizing lotion as directed Topical Primary Dressing KerraCel Ag Gelling Fiber Dressing, 2x2 in (silver alginate) Discharge Instruction: Apply silver alginate to wound bed as instructed Secondary Dressing ABD Pad, 5x9 Discharge Instruction: Apply over primary dressing as directed. Secured With [employer], 4.5x3.1 (in/yd) Discharge Instruction: Secure with Kerlix as directed. 8M Medipore Soft Cloth Surgical T 2x10 (in/yd) ape Discharge Instruction: Secure with tape as directed. Compression Wrap Compression Stockings Add-Ons Wound #32 (Elbow) Wound Laterality: Right Cleanser Soap and Water Discharge Instruction: May shower and wash wound with dial antibacterial soap and water prior to dressing change. Wound Cleanser Discharge Instruction: Cleanse the wound with wound cleanser prior to applying a clean dressing using gauze sponges, not tissue or cotton balls. Peri-Wound Care Topical Primary Dressing KerraCel Ag Gelling Fiber Dressing, 2x2 in (silver alginate) Discharge Instruction: Apply silver alginate to wound bed as instructed Secondary Dressing ABD Pad, 5x9 Discharge Instruction: Apply over primary dressing as directed. Secured With [employer], 4.5x3.1 (in/yd) Discharge Instruction: Secure with Kerlix as directed. 8M Medipore Soft Cloth Surgical T 2x10 (in/yd) ape Discharge Instruction: Secure with tape as directed. Compression Wrap Compression Stockings Add-Ons Electronic Signature(s) Signed: 08/30/2021 11:54:37 AM By: Fredirick Maudlin MD FACS Signed: 08/30/2021 4:13:50 PM By: Sabas Sous By: Fredirick Maudlin on 08/30/2021 11:54:37 -------------------------------------------------------------------------------- Multi-Disciplinary Care Plan Details Patient Name: Date of  Service: [name], [name] 08/30/2021 10:45 A M Medical Record Number: [id number] Patient Account Number: [id number] Date of Birth/Sex: Treating RN: [birth year] (76 y.o. [name] Primary Care Sontee Desena: Theodora Blow, MIKE Other Clinician: Referring Mena Simonis: Treating Kalissa Grays/Extender: Sharlyn Bologna, MIKE Weeks in Treatment: [place] reviewed with physician Active Inactive Wound/Skin Impairment Nursing Diagnoses: Knowledge deficit related to ulceration/compromised skin integrity Goals: Patient/caregiver will verbalize understanding of skin care regimen Date Initiated: 05/20/2019 Target Resolution Date: 09/15/2021 Goal Status: Active Ulcer/skin breakdown will have a volume reduction of 30% by week 4 Date Initiated: 12/31/2017 Date Inactivated: 05/20/2019 Target Resolution Date: 06/18/2018 Goal Status: Met Interventions: Assess patient/caregiver ability to obtain necessary supplies Assess patient/caregiver ability to perform ulcer/skin care regimen upon admission and as needed Assess ulceration(s) every visit Notes: Electronic Signature(s) Signed: 08/30/2021 4:13:50 PM By: Adline Peals Entered By: Adline Peals on 08/30/2021 11:09:51 -------------------------------------------------------------------------------- Pain Assessment Details Patient Name: Date of Service: CLETUS, [place] RD R. 08/30/2021 10:45 A M Medical Record Number: [id number] Patient Account Number: [id number] Date of Birth/Sex: Treating RN: 14-May-1945 (76 y.o. [name] Primary Care Brynn Mulgrew: Theodora Blow, MIKE Other Clinician: Referring Francella Barnett: Treating Ronne Stefanski/Extender: Sharlyn Bologna, MIKE Weeks in Treatment: [phone] Active Problems Location of Pain Severity and Description of Pain Patient Has Paino No Site Locations Rate the pain. Current Pain Level: 0 Pain Management and Medication Current Pain Management: Electronic  Signature(s) Signed: 08/30/2021 4:13:50 PM By: Adline Peals Entered By: Adline Peals on 08/30/2021 11:01:54 -------------------------------------------------------------------------------- Patient/Caregiver Education Details Patient Name: Date of  Service: Regino Schultze RD R. 8/16/2023andnbsp10:45 A M Medical Record Number: [id number] Patient Account Number: [id number] Date of Birth/Gender: Treating RN: [birth year] (76 y.o. [name] Primary Care Physician: Theodora Blow, MIKE Other Clinician: Referring Physician: Treating Physician/Extender: Sharlyn Bologna, MIKE Weeks in Treatment: [phone] Education Assessment Education Provided To: Patient Education Topics Provided Wound/Skin Impairment: Methods: Explain/Verbal Responses: Reinforcements needed, State content correctly Electronic Signature(s) Signed: 08/30/2021 4:13:50 PM By: Adline Peals Entered By: Adline Peals on 08/30/2021 11:10:05 -------------------------------------------------------------------------------- Wound Assessment Details Patient Name: Date of Service: [name], [name] 08/30/2021 10:45 A M Medical Record Number: [id number] Patient Account Number: [id number] Date of Birth/Sex: Treating RN: 02-Sep-1945 (76 y.o. [name] Primary Care Elzena Muston: Theodora Blow, MIKE Other Clinician: Referring Greg Cratty: Treating Denajah Farias/Extender: Sharlyn Bologna, MIKE Weeks in Treatment: 191 Wound Status Wound Number: 23 Primary Etiology: Pressure Ulcer Wound Location: Right Elbow Wound Status: Open Wounding Event: Pressure Injury Comorbid History: Anemia, Hypertension, Type II Diabetes, Paraplegia Date Acquired: 05/26/2020 Weeks Of Treatment: 65 Clustered Wound: No Photos Photo Uploaded By: Donavan Burnet on 08/30/2021 16:46:50 Wound Measurements Length: (cm) 1 Width: (cm) 1.5 Depth: (cm) 0.2 Area: (cm) 1.178 Volume: (cm) 0.236 % Reduction in Area: 84.8% %  Reduction in Volume: 97% Epithelialization: Small (1-33%) Tunneling: No Undermining: No Wound Description Classification: Category/Stage IV Wound Margin: Well defined, not attached Exudate Amount: Medium Exudate Type: Serosanguineous Exudate Color: red, brown Foul Odor After Cleansing: No Slough/Fibrino No Wound Bed Granulation Amount: Large (67-100%) Exposed Structure Granulation Quality: Red Fascia Exposed: No Necrotic Amount: None Present (0%) Fat Layer (Subcutaneous Tissue) Exposed: Yes Tendon Exposed: No Muscle Exposed: No Joint Exposed: No Bone Exposed: No Electronic Signature(s) Signed: 08/30/2021 4:13:50 PM By: Adline Peals Entered By: Adline Peals on 08/30/2021 11:56:54 -------------------------------------------------------------------------------- Wound Assessment Details Patient Name: Date of Service: [name], [name] 08/30/2021 10:45 A M Medical Record Number: [id number] Patient Account Number: [id number] Date of Birth/Sex: Treating RN: Jan 10, 1946 (76 y.o. [name] Primary Care Tysen Roesler: Theodora Blow, MIKE Other Clinician: Referring Khaliyah Northrop: Treating Devanny Palecek/Extender: Sharlyn Bologna, MIKE Weeks in Treatment: 191 Wound Status Wound Number: 24 Primary Etiology: Lymphedema Wound Location: Left, Anterior Lower Leg Wound Status: Open Wounding Event: Gradually Appeared Comorbid History: Anemia, Hypertension, Type II Diabetes, Paraplegia Date Acquired: 09/14/2020 Weeks Of Treatment: 50 Clustered Wound: No Photos Photo Uploaded By: Donavan Burnet on 08/30/2021 16:46:50 Wound Measurements Length: (cm) Width: (cm) Depth: (cm) Area: (cm) Volume: (cm) 0 % Reduction in Area: 100% 0 % Reduction in Volume: 100% 0 Epithelialization: Large (67-100%) 0 Tunneling: No 0 Undermining: No Wound Description Classification: Full Thickness Without Exposed Support Structures Wound Margin: Flat and Intact Exudate Amount: None  Present Foul Odor After Cleansing: No Slough/Fibrino No Wound Bed Granulation Amount: None Present (0%) Exposed Structure Necrotic Amount: None Present (0%) Fascia Exposed: No Fat Layer (Subcutaneous Tissue) Exposed: No Tendon Exposed: No Muscle Exposed: No Joint Exposed: No Bone Exposed: No Electronic Signature(s) Signed: 08/30/2021 4:13:50 PM By: Adline Peals Entered By: Adline Peals on 08/30/2021 11:07:40 -------------------------------------------------------------------------------- Wound Assessment Details Patient Name: Date of Service: [name], [name] 08/30/2021 10:45 A M Medical Record Number: [id number] Patient Account Number: [id number] Date of Birth/Sex: Treating RN: [birth year] (76 y.o. [name] Primary Care Lillith Mcneff: Theodora Blow, MIKE Other Clinician: Referring Tyrece Vanterpool: Treating Kele Barthelemy/Extender: Sharlyn Bologna, MIKE Weeks in Treatment: 191 Wound Status Wound Number: 25 Primary Etiology: Lymphedema Wound Location: Right, Anterior Lower Leg Wound Status: Open Wounding Event: Gradually Appeared Comorbid History: Anemia,  Hypertension, Type II Diabetes, Paraplegia Date Acquired: 09/14/2020 Weeks Of Treatment: 50 Clustered Wound: No Photos Photo Uploaded By: Donavan Burnet on 08/30/2021 16:46:50 Wound Measurements Length: (cm) Width: (cm) Depth: (cm) Area: (cm) Volume: (cm) 0 % Reduction in Area: 100% 0 % Reduction in Volume: 100% 0 Epithelialization: Large (67-100%) 0 Tunneling: No 0 Undermining: No Wound Description Classification: Full Thickness Without Exposed Support Structures Wound Margin: Flat and Intact Exudate Amount: None Present Foul Odor After Cleansing: No Slough/Fibrino No Wound Bed Granulation Amount: None Present (0%) Exposed Structure Necrotic Amount: None Present (0%) Fascia Exposed: No Fat Layer (Subcutaneous Tissue) Exposed: No Tendon Exposed: No Muscle Exposed: No Joint Exposed:  No Bone Exposed: No Electronic Signature(s) Signed: 08/30/2021 4:13:50 PM By: Adline Peals Entered By: Adline Peals on 08/30/2021 11:07:51 -------------------------------------------------------------------------------- Wound Assessment Details Patient Name: Date of Service: [name], [name] 08/30/2021 10:45 A M Medical Record Number: [id number] Patient Account Number: [id number] Date of Birth/Sex: Treating RN: 10-21-1945 (76 y.o. [name] Primary Care Clemons Salvucci: Theodora Blow, MIKE Other Clinician: Referring Avrom Robarts: Treating Cassadi Purdie/Extender: Sharlyn Bologna, MIKE Weeks in Treatment: 191 Wound Status Wound Number: 29 Primary Etiology: Pressure Ulcer Wound Location: Right Wrist Wound Status: Open Wounding Event: Gradually Appeared Comorbid History: Anemia, Hypertension, Type II Diabetes, Paraplegia Date Acquired: 05/10/2021 Weeks Of Treatment: 16 Clustered Wound: No Photos Photo Uploaded By: Donavan Burnet on 08/30/2021 16:46:50 Wound Measurements Length: (cm) Width: (cm) Depth: (cm) Area: (cm) Volume: (cm) 0 % Reduction in Area: 100% 0 % Reduction in Volume: 100% 0 Epithelialization: Large (67-100%) 0 Tunneling: No 0 Undermining: No Wound Description Classification: Category/Stage II Wound Margin: Flat and Intact Exudate Amount: None Present Foul Odor After Cleansing: No Slough/Fibrino No Wound Bed Granulation Amount: None Present (0%) Exposed Structure Necrotic Amount: None Present (0%) Fascia Exposed: No Fat Layer (Subcutaneous Tissue) Exposed: No Tendon Exposed: No Muscle Exposed: No Joint Exposed: No Bone Exposed: No Electronic Signature(s) Signed: 08/30/2021 4:13:50 PM By: Adline Peals Entered By: Adline Peals on 08/30/2021 11:08:01 -------------------------------------------------------------------------------- Wound Assessment Details Patient Name: Date of Service: [name], [name] 08/30/2021 10:45  A M Medical Record Number: [id number] Patient Account Number: [id number] Date of Birth/Sex: Treating RN: [birth year] (76 y.o. [name] Primary Care Velva Molinari: Theodora Blow, MIKE Other Clinician: Referring Ansley Mangiapane: Treating Arjan Strohm/Extender: Sharlyn Bologna, MIKE Weeks in Treatment: 191 Wound Status Wound Number: 30 Primary Etiology: Skin Tear Wound Location: Left, Medial Lower Leg Wound Status: Open Wounding Event: Skin Tear/Laceration Comorbid History: Anemia, Hypertension, Type II Diabetes, Paraplegia Date Acquired: 08/30/2021 Weeks Of Treatment: 0 Clustered Wound: No Photos Photo Uploaded By: Donavan Burnet on 08/30/2021 16:46:50 Wound Measurements Length: (cm) 0.3 Width: (cm) 0.2 Depth: (cm) 0.1 Area: (cm) 0.047 Volume: (cm) 0.005 % Reduction in Area: % Reduction in Volume: Epithelialization: Small (1-33%) Tunneling: No Undermining: No Wound Description Classification: Full Thickness Without Exposed Support Structures Wound Margin: Distinct, outline attached Exudate Amount: Small Exudate Type: Serous Exudate Color: amber Foul Odor After Cleansing: No Slough/Fibrino No Wound Bed Granulation Amount: Large (67-100%) Exposed Structure Granulation Quality: Red Fascia Exposed: No Necrotic Amount: None Present (0%) Fat Layer (Subcutaneous Tissue) Exposed: Yes Tendon Exposed: No Muscle Exposed: No Joint Exposed: No Bone Exposed: No Treatment Notes Wound #30 (Lower Leg) Wound Laterality: Left, Medial Cleanser Soap and Water Discharge Instruction: May shower and wash wound with dial antibacterial soap and water prior to dressing change. Wound Cleanser Discharge Instruction: Cleanse the wound with wound cleanser prior to applying a clean dressing using gauze  sponges, not tissue or cotton balls. Peri-Wound Care Triamcinolone 15 (g) Discharge Instruction: Use triamcinolone 15 (g) as directed Sween Lotion (Moisturizing lotion) Discharge  Instruction: Apply moisturizing lotion as directed Topical Primary Dressing KerraCel Ag Gelling Fiber Dressing, 2x2 in (silver alginate) Discharge Instruction: Apply silver alginate to wound bed as instructed Secondary Dressing ABD Pad, 5x9 Discharge Instruction: Apply over primary dressing as directed. Secured With [employer], 4.5x3.1 (in/yd) Discharge Instruction: Secure with Kerlix as directed. 97M Medipore Soft Cloth Surgical T 2x10 (in/yd) ape Discharge Instruction: Secure with tape as directed. Compression Wrap Compression Stockings Add-Ons Electronic Signature(s) Signed: 08/30/2021 4:13:50 PM By: Adline Peals Entered By: Adline Peals on 08/30/2021 11:11:03 -------------------------------------------------------------------------------- Wound Assessment Details Patient Name: Date of Service: SNYDER, COLAVITO RD R. 08/30/2021 10:45 A M Medical Record Number: [id number] Patient Account Number: [id number] Date of Birth/Sex: Treating RN: April 22, 1945 (76 y.o. [name] Primary Care Michaelyn Wall: Theodora Blow, MIKE Other Clinician: Referring Andreea Arca: Treating Gracelyn Coventry/Extender: Sharlyn Bologna, MIKE Weeks in Treatment: 191 Wound Status Wound Number: 31 Primary Etiology: Skin Tear Wound Location: Right, Lateral Lower Leg Wound Status: Open Wounding Event: Skin Tear/Laceration Comorbid History: Anemia, Hypertension, Type II Diabetes, Paraplegia Date Acquired: 08/30/2021 Weeks Of Treatment: 0 Clustered Wound: No Photos Photo Uploaded By: Donavan Burnet on 08/30/2021 16:47:36 Wound Measurements Length: (cm) 3.5 Width: (cm) 0.3 Depth: (cm) 0.1 Area: (cm) 0.825 Volume: (cm) 0.082 % Reduction in Area: % Reduction in Volume: Epithelialization: Small (1-33%) Tunneling: No Undermining: No Wound Description Classification: Full Thickness Without Exposed Support Structures Wound Margin: Distinct, outline attached Exudate Amount:  Small Exudate Type: Serosanguineous Exudate Color: red, brown Foul Odor After Cleansing: No Slough/Fibrino No Wound Bed Granulation Amount: Large (67-100%) Exposed Structure Granulation Quality: Red Fascia Exposed: No Necrotic Amount: None Present (0%) Fat Layer (Subcutaneous Tissue) Exposed: Yes Tendon Exposed: No Muscle Exposed: No Joint Exposed: No Bone Exposed: No Treatment Notes Wound #31 (Lower Leg) Wound Laterality: Right, Lateral Cleanser Soap and Water Discharge Instruction: May shower and wash wound with dial antibacterial soap and water prior to dressing change. Wound Cleanser Discharge Instruction: Cleanse the wound with wound cleanser prior to applying a clean dressing using gauze sponges, not tissue or cotton balls. Peri-Wound Care Triamcinolone 15 (g) Discharge Instruction: Use triamcinolone 15 (g) as directed Sween Lotion (Moisturizing lotion) Discharge Instruction: Apply moisturizing lotion as directed Topical Primary Dressing KerraCel Ag Gelling Fiber Dressing, 2x2 in (silver alginate) Discharge Instruction: Apply silver alginate to wound bed as instructed Secondary Dressing ABD Pad, 5x9 Discharge Instruction: Apply over primary dressing as directed. Secured With [employer], 4.5x3.1 (in/yd) Discharge Instruction: Secure with Kerlix as directed. 97M Medipore Soft Cloth Surgical T 2x10 (in/yd) ape Discharge Instruction: Secure with tape as directed. Compression Wrap Compression Stockings Add-Ons Electronic Signature(s) Signed: 08/30/2021 4:13:50 PM By: Adline Peals Entered By: Adline Peals on 08/30/2021 11:12:05 -------------------------------------------------------------------------------- Vitals Details Patient Name: Date of Service: [name], [name] 08/30/2021 10:45 A M Medical Record Number: [id number] Patient Account Number: [id number] Date of Birth/Sex: Treating RN: December 12, 1945 (76 y.o. [name] Primary Care  Roan Miklos: Theodora Blow, MIKE Other Clinician: Referring [place] Yearby: Treating Kosta Schnitzler/Extender: Sharlyn Bologna, MIKE Weeks in Treatment: 191 Vital Signs Time Taken: 10:58 Temperature (F): 97.8 Height (in): 67 Pulse (bpm): 86 Weight (lbs): 130 Respiratory Rate (breaths/min): 18 Body Mass Index (BMI): 20.4 Blood Pressure (mmHg): 164/91 Reference Range: 80 - 120 mg / dl Electronic Signature(s) Signed: 08/30/2021 4:13:50 PM By: Adline Peals Entered By: Adline Peals on 08/30/2021 11:18:18

## 2021-08-31 NOTE — Progress Notes (Signed)
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 on his posterior right calf. He has accumulated quite a bit of dead skin in plaques on both of his legs. 08/30/2021: 72-monthfollow-up. The old wounds have all healed, but he has reopened the pressure wound on his  right elbow. It is stage III but very clean. On his legs, he has a very superficial ulcer on his left medial leg and a similar-appearing lesion on his right lateral leg. His daughter has been performing his wound care. Patient History Information obtained from Patient. Social History Never smoker, Marital Status - Separated, Alcohol Use - Never, Drug Use - No History, Caffeine Use - Never. Medical History Eyes Denies history of Cataracts, Glaucoma, Optic Neuritis Ear/Nose/Mouth/Throat Denies history of Chronic sinus problems/congestion, Middle ear problems Hematologic/Lymphatic Patient has history of Anemia - Iron deficiency Denies history of Hemophilia, Human Immunodeficiency Virus, Lymphedema, Sickle Cell Disease Respiratory Denies history of Aspiration, Asthma, Chronic Obstructive Pulmonary Disease (COPD), Pneumothorax, Sleep Apnea, Tuberculosis Cardiovascular Patient has history of Hypertension Denies history of Angina, Arrhythmia, Congestive Heart Failure, Coronary Artery Disease, Deep Vein Thrombosis, Hypotension, Myocardial Infarction, Peripheral Arterial Disease, Peripheral Venous Disease, Phlebitis, Vasculitis Gastrointestinal Denies history of Cirrhosis , Colitis, Crohnoos, Hepatitis A, Hepatitis B, Hepatitis C Endocrine Patient has history of Type II Diabetes Denies history  of Type I Diabetes Genitourinary Denies history of End Stage Renal Disease Immunological Denies history of Lupus Erythematosus, Raynaudoos, Scleroderma Integumentary (Skin) Denies history of History of Burn Musculoskeletal Denies history of Gout, Rheumatoid Arthritis, Osteoarthritis, Osteomyelitis Neurologic Patient has history of Paraplegia Denies history of Dementia, Neuropathy, Quadriplegia, Seizure Disorder Oncologic Denies history of Received Chemotherapy, Received Radiation Psychiatric Denies history of Anorexia/bulimia, Confinement Anxiety Hospitalization/Surgery History - Hand  surgery. Objective Constitutional Hypertensive, asymptomatic. No acute distress.. Vitals Time Taken: 10:58 AM, Height: 67 in, Weight: 130 lbs, BMI: 20.4, Temperature: 97.8 F, Pulse: 86 bpm, Respiratory Rate: 18 breaths/min, Blood Pressure: 164/91 mmHg. Respiratory Normal work of breathing on room air.. General Notes: 08/30/2021: The old wounds have all healed, but he has reopened the pressure wound on his right elbow. It is stage III but very clean. On his legs, he has a very superficial ulcer on his left medial leg and a similar-appearing lesion on his right lateral leg. Integumentary (Hair, Skin) Wound #23 status is Open. Original cause of wound was Pressure Injury. The date acquired was: 05/26/2020. The wound has been in treatment 65 weeks. The wound is located on the Right Elbow. The wound measures 1cm length x 1.5cm width x 0.2cm depth; 1.178cm^2 area and 0.236cm^3 volume. There is Fat Layer (Subcutaneous Tissue) exposed. There is no tunneling or undermining noted. There is a medium amount of serosanguineous drainage noted. The wound margin is well defined and not attached to the wound base. There is large (67-100%) red granulation within the wound bed. There is no necrotic tissue within the wound bed. Wound #24 status is Open. Original cause of wound was Gradually Appeared. The date acquired was: 09/14/2020. The wound has been in treatment 50 weeks. The wound is located on the Left,Anterior Lower Leg. The wound measures 0cm length x 0cm width x 0cm depth; 0cm^2 area and 0cm^3 volume. There is no tunneling or undermining noted. There is a none present amount of drainage noted. The wound margin is flat and intact. There is no granulation within the wound bed. There is no necrotic tissue within the wound bed. Wound #25 status is Open. Original cause of wound was Gradually Appeared. The date acquired was: 09/14/2020. The wound has been in treatment 50 weeks. The wound is located on the  Right,Anterior Lower Leg. The wound measures 0cm length x 0cm width x 0cm depth; 0cm^2 area and 0cm^3 volume. There is no tunneling or undermining noted. There is a none present amount of drainage noted. The wound margin is flat and intact. There is no granulation within the wound bed. There is no necrotic tissue within the wound bed. Wound #29 status is Open. Original cause of wound was Gradually Appeared. The date acquired was: 05/10/2021. The wound has been in treatment 16 weeks. The wound is located on the Right Wrist. The wound measures 0cm length x 0cm width x 0cm depth; 0cm^2 area and 0cm^3 volume. There is no tunneling or undermining noted. There is a none present amount of drainage noted. The wound margin is flat and intact. There is no granulation within the wound bed. There is no necrotic tissue within the wound bed. Wound #30 status is Open. Original cause of wound was Skin T ear/Laceration. The date acquired was: 08/30/2021. The wound is located on the Left,Medial Lower Leg. The wound measures 0.3cm length x 0.2cm width x 0.1cm depth; 0.047cm^2 area and 0.005cm^3 volume. There is Fat Layer (Subcutaneous Tissue) exposed. There is no tunneling or undermining noted. There is  a small amount of serous drainage noted. The wound margin is distinct with the outline attached to the wound base. There is large (67-100%) red granulation within the wound bed. There is no necrotic tissue within the wound bed. Wound #31 status is Open. Original cause of wound was Skin T ear/Laceration. The date acquired was: 08/30/2021. The wound is located on the Right,Lateral Lower Leg. The wound measures 3.5cm length x 0.3cm width x 0.1cm depth; 0.825cm^2 area and 0.082cm^3 volume. There is Fat Layer (Subcutaneous Tissue) exposed. There is no tunneling or undermining noted. There is a small amount of serosanguineous drainage noted. The wound margin is distinct with the outline attached to the wound base. There is large  (67-100%) red granulation within the wound bed. There is no necrotic tissue within the wound bed. 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 - wash and gently scrub both legs prior to apply bandage Edema Control - Lymphedema / SCD / Other: Elevate legs to the level of the heart or above for 30 minutes daily and/or when sitting, a frequency of: - throughout the day Off-Loading: Turn and reposition every 2 hours WOUND #30: - Lower Leg Wound Laterality: Left, Medial Cleanser: Soap and Water 1 x Per Day/30 Days Discharge Instructions: May shower and wash wound with dial antibacterial soap and water prior to dressing change. Cleanser: Wound Cleanser 1 x Per Day/30 Days Discharge Instructions: Cleanse the wound with wound cleanser prior to applying a clean dressing using gauze sponges, not tissue or cotton balls. Peri-Wound Care: Triamcinolone 15 (g) 1 x Per Day/30 Days Discharge Instructions: Use triamcinolone 15 (g) as directed Peri-Wound Care: Sween Lotion (Moisturizing lotion) 1 x Per Day/30 Days Discharge Instructions: Apply moisturizing lotion as directed Prim Dressing: KerraCel Ag Gelling Fiber Dressing, 2x2 in (silver alginate) 1 x Per Day/30 Days ary Discharge Instructions: Apply silver alginate to wound bed as instructed Secondary Dressing: ABD Pad, 5x9 1 x Per Day/30 Days Discharge Instructions: Apply over primary dressing as directed. Secured With: [employer], 4.5x3.1 (in/yd) 1 x Per Day/30 Days Discharge  Instructions: Secure with Kerlix as directed. Secured With: 14M Medipore Public affairs consultant Surgical T 2x10 (in/yd) 1 x Per Day/30 Days ape Discharge Instructions: Secure with tape as directed. WOUND #31: - Lower Leg Wound Laterality: Right, Lateral Cleanser: Soap and Water 1 x Per Day/30 Days Discharge Instructions: May shower and wash wound with dial antibacterial soap and water prior to dressing change. Cleanser: Wound Cleanser 1 x Per Day/30 Days Discharge Instructions: Cleanse the wound with wound cleanser prior to applying a clean dressing using gauze sponges, not tissue or cotton balls. Peri-Wound Care: Triamcinolone 15 (g) 1 x Per Day/30 Days Discharge Instructions: Use triamcinolone 15 (g) as directed Peri-Wound Care: Sween Lotion (Moisturizing lotion) 1 x Per Day/30 Days Discharge Instructions: Apply moisturizing lotion as directed Prim Dressing: KerraCel Ag Gelling Fiber Dressing, 2x2 in (silver alginate) 1 x Per Day/30 Days ary Discharge Instructions: Apply silver alginate to wound bed as instructed Secondary Dressing: ABD Pad, 5x9 1 x Per Day/30 Days Discharge Instructions: Apply over primary dressing as directed. Secured With: [employer],  4.5x3.1 (in/yd) 1 x Per Day/30 Days Discharge Instructions: Secure with Kerlix as directed. Secured With: 23M [employer] Public affairs consultant Surgical T 2x10 (in/yd) 1 x Per Day/30 Days ape Discharge Instructions: Secure with tape as directed. 08/30/2021: 44-monthfollow-up. The old wounds have all healed, but he has reopened the pressure wound on his right elbow. It is stage III but very clean. On his legs, he has a very superficial ulcer on his left medial leg and a similar-appearing lesion on his right lateral leg. No debridement was necessary today. We will continue to use liberal triamcinolone ointment to his legs with silver alginate on the wounds, wrapped in Kerlix. We will use silver alginate on the elbow pressure ulcer and use a heel cup to better  pad his elbow. We will communicate our recommendations to his daughter. He will follow-up in 2 months. Electronic Signature(s) Signed: 08/30/2021 11:59:33 AM By: CFredirick MaudlinMD FACS Entered By: CFredirick Maudlinon 08/30/2021 11:59:33 -------------------------------------------------------------------------------- HxROS Details Patient Name: Date of Service: DIdolina [name] [name] 08/30/2021 10:45 A M Medical Record Number: [id number] Account Number: [id number] of Birth/Sex: Treating RN: 5Mar 27, 1947(76y.o. MJanyth ContesPrimary Care Provider: GTheodora Blow MIKE Other Clinician: Referring Provider: Treating Provider/Extender: CSharlyn Bologna MIKE Weeks in Treatment: 191 Information Obtained From Patient Eyes Medical History: Negative for: Cataracts; Glaucoma; Optic Neuritis Ear/Nose/Mouth/Throat Medical History: Negative for: Chronic sinus problems/congestion; Middle ear problems Hematologic/Lymphatic Medical History: Positive for: Anemia - Iron deficiency Negative for: Hemophilia; Human Immunodeficiency Virus; Lymphedema; Sickle Cell Disease Respiratory Medical History: Negative for: Aspiration; Asthma; Chronic Obstructive Pulmonary Disease (COPD); Pneumothorax; Sleep Apnea; Tuberculosis Cardiovascular Medical History: Positive for: Hypertension Negative for: Angina; Arrhythmia; Congestive Heart Failure; Coronary Artery Disease; Deep Vein Thrombosis; Hypotension; Myocardial Infarction; Peripheral Arterial Disease; Peripheral Venous Disease; Phlebitis; Vasculitis Gastrointestinal Medical History: Negative for: Cirrhosis ; Colitis; Crohns; Hepatitis A; Hepatitis B; Hepatitis C Endocrine Medical History: Positive for: Type II Diabetes Negative for: Type I Diabetes Time with diabetes: unknown Treated with: Oral agents Blood sugar tested every day: Yes Tested : Genitourinary Medical History: Negative for: End Stage Renal  Disease Immunological Medical History: Negative for: Lupus Erythematosus; Raynauds; Scleroderma Integumentary (Skin) Medical History: Negative for: History of Burn Musculoskeletal Medical History: Negative for: Gout; Rheumatoid Arthritis; Osteoarthritis; Osteomyelitis Neurologic Medical History: Positive for: Paraplegia Negative for: Dementia; Neuropathy; Quadriplegia; Seizure Disorder Oncologic Medical History: Negative for: Received Chemotherapy; Received Radiation Psychiatric Medical History: Negative for: Anorexia/bulimia; Confinement Anxiety Immunizations Pneumococcal Vaccine: Received Pneumococcal Vaccination: Yes Received Pneumococcal Vaccination On or After 60th Birthday: Yes Implantable Devices No devices added Hospitalization / Surgery History Type of Hospitalization/Surgery Hand surgery Family and Social History Never smoker; Marital Status - Separated; Alcohol Use: Never; Drug Use: No History; Caffeine Use: Never; Financial Concerns: No; Food, Clothing or Shelter Needs: No; Support System Lacking: No; Transportation Concerns: No Electronic Signature(s) Signed: 08/30/2021 12:39:47 PM By: CFredirick MaudlinMD FACS Signed: 08/30/2021 4:13:50 PM By: HAdline PealsEntered By: CFredirick Maudlinon 08/30/2021 11:57:12 -------------------------------------------------------------------------------- SuperBill Details Patient Name: Date of Service: DRegino SchultzeRD R. 08/30/2021 Medical Record Number: [id number] Account Number: [id number] of Birth/Sex: Treating RN: 5January 03, 1947(76y.o. MJanyth ContesPrimary Care Provider: GTheodora Blow MIKE Other Clinician: Referring Provider: Treating Provider/Extender: CSharlyn Bologna MIKE Weeks in Treatment: 191 Diagnosis Coding ICD-10 Codes Code Description [id number] traumatic amputation of right hand at wrist level, subsequent encounter S80.829D Blister (nonthermal), unspecified lower leg,  subsequent encounter L97.909 Non-pressure chronic ulcer of unspecified part  of unspecified lower leg with unspecified severity S60.811A Abrasion of right wrist, initial encounter L89.013 Pressure ulcer of right elbow, stage 3 Facility Procedures CPT4 Code: [id number] Description: 99214 - WOUND CARE VISIT-LEV 4 EST PT Modifier: Quantity: 1 Physician Procedures : CPT4 Code Description Modifier [id number] - WC PHYS LEVEL 4 - EST PT ICD-10 Diagnosis Description L97.909 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

## 2021-10-23 NOTE — Progress Notes (Signed)
DAYSON, ABOUD ([id number]) [id number] Page 1 of 11 Visit Report for 07/05/2021 Arrival Information Details Patient Name: Date of Service: PANTHER, [place] RD R. 07/05/2021 10:45 A M Medical Record Number: [id number] Patient Account Number: [id number] Date of Birth/Sex: Treating RN: 1945-04-18 (76 y.o. Male) Sharyn Creamer Primary Care Ethyn Schetter: Theodora Blow, MIKE Other Clinician: Referring Tacey Dimaggio: Treating Trannie Bardales/Extender: Sharlyn Bologna, MIKE Weeks in Treatment: 35 Visit Information History Since Last Visit Added or deleted any medications: No Patient Arrived: Stretcher Any new allergies or adverse reactions: No Arrival Time: 16:13 Had a fall or experienced change in No Accompanied By: emt activities of daily living that may affect Transfer Assistance: None risk of falls: Patient Identification Verified: Yes Signs or symptoms of abuse/neglect since last visito No Secondary Verification Process Completed: Yes Hospitalized since last visit: No Patient Requires Transmission-Based Precautions: No Implantable device outside of the clinic excluding No Patient Has Alerts: Yes cellular tissue based products placed in the center Patient Alerts: R ABI non compressible since last visit: Pain Present Now: No Electronic Signature(s) Signed: 10/04/2021 2:00:58 PM By: Deon Pilling RN, BSN Previous Signature: 07/07/2021 4:31:32 PM Version By: Sharyn Creamer RN, BSN Entered By: Deon Pilling on 10/04/2021 13:14:14 -------------------------------------------------------------------------------- Clinic Level of Care Assessment Details Patient Name: Date of Service: Nosal, [name] 07/05/2021 10:45 A M Medical Record Number: [id number] Patient Account Number: [id number] Date of Birth/Sex: Treating RN: Jul 11, 1945 (76 y.o. Male) Sharyn Creamer Primary Care Linzi Ohlinger: Theodora Blow, MIKE Other Clinician: Referring Hendry Speas: Treating Yaremi Stahlman/Extender: Sharlyn Bologna, MIKE Weeks in Treatment: [facility] Level of Care Assessment Items TOOL 4 Quantity Score X- 1 0 Use when only an EandM is performed on FOLLOW-UP visit ASSESSMENTS - Nursing Assessment / Reassessment X- 1 10 Reassessment of Co-morbidities (includes updates in patient status) X- 1 5 Reassessment of Adherence to Treatment Plan ASSESSMENTS - Wound and Skin A ssessment / Reassessment '[]'  - 0 Simple Wound Assessment / Reassessment - one wound X- 3 5 Complex Wound Assessment / Reassessment - multiple wounds '[]'  - 0 Dermatologic / Skin Assessment (not related to wound area) ASSESSMENTS - Focused Assessment X- 2 5 Circumferential Edema Measurements - multi extremities '[]'  - 0 Nutritional Assessment / Counseling / Intervention '[]'  - 0 Lower Extremity Assessment (monofilament, tuning fork, pulses) Bonello, Avyon R ([id number]) [id number] Page 2 of 11 '[]'  - 0 Peripheral Arterial Disease Assessment (using hand held doppler) ASSESSMENTS - Ostomy and/or Continence Assessment and Care '[]'  - 0 Incontinence Assessment and Management '[]'  - 0 Ostomy Care Assessment and Management (repouching, etc.) PROCESS - Coordination of Care X - Simple Patient / Family Education for ongoing care 1 15 '[]'  - 0 Complex (extensive) Patient / Family Education for ongoing care X- 1 10 Staff obtains Programmer, systems, Records, T Results / Process Orders est '[]'  - 0 Staff telephones HHA, Nursing Homes / Clarify orders / etc '[]'  - 0 Routine Transfer to another Facility (non-emergent condition) '[]'  - 0 Routine Hospital Admission (non-emergent condition) '[]'  - 0 New Admissions / Biomedical engineer / Ordering NPWT Apligraf, etc. , '[]'  - 0 Emergency Hospital Admission (emergent condition) '[]'  - 0 Simple Discharge Coordination '[]'  - 0 Complex (extensive) Discharge Coordination PROCESS - Special Needs '[]'  - 0 Pediatric / Minor Patient Management '[]'  - 0 Isolation Patient  Management '[]'  - 0 Hearing / Language / Visual special needs '[]'  - 0 Assessment of Community assistance (transportation, D/C planning, etc.) '[]'  - 0 Additional assistance / Altered mentation '[]'  - 0 Support Surface(s) Assessment (  bed, cushion, seat, etc.) INTERVENTIONS - Wound Cleansing / Measurement '[]'  - 0 Simple Wound Cleansing - one wound X- 3 5 Complex Wound Cleansing - multiple wounds X- 1 5 Wound Imaging (photographs - any number of wounds) '[]'  - 0 Wound Tracing (instead of photographs) '[]'  - 0 Simple Wound Measurement - one wound X- 3 5 Complex Wound Measurement - multiple wounds INTERVENTIONS - Wound Dressings X - Small Wound Dressing one or multiple wounds 1 10 X- 2 15 Medium Wound Dressing one or multiple wounds '[]'  - 0 Large Wound Dressing one or multiple wounds '[]'  - 0 Application of Medications - topical '[]'  - 0 Application of Medications - injection INTERVENTIONS - Miscellaneous '[]'  - 0 External ear exam '[]'  - 0 Specimen Collection (cultures, biopsies, blood, body fluids, etc.) '[]'  - 0 Specimen(s) / Culture(s) sent or taken to Lab for analysis '[]'  - 0 Patient Transfer (multiple staff / Civil Service fast streamer / Similar devices) '[]'  - 0 Simple Staple / Suture removal (25 or less) '[]'  - 0 Complex Staple / Suture removal (26 or more) '[]'  - 0 Hypo / Hyperglycemic Management (close monitor of Blood Glucose) '[]'  - 0 Ankle / Brachial Index (ABI) - do not check if billed separately MEHKI, KLUMPP R ([id number]) [id number] Page 3 of 11 X- 1 5 Vital Signs Has the patient been seen at the hospital within the last three years: Yes Total Score: 145 Level Of Care: New/Established - Level 4 Electronic Signature(s) Signed: 07/07/2021 4:31:32 PM By: Sharyn Creamer RN, BSN Entered By: Sharyn Creamer on 07/05/2021 16:17:05 -------------------------------------------------------------------------------- Encounter Discharge Information Details Patient Name: Date of  Service: Regino Schultze RD R. 07/05/2021 10:45 A M Medical Record Number: [id number] Patient Account Number: [id number] Date of Birth/Sex: Treating RN: 06-07-1945 (76 y.o. Male) Sharyn Creamer Primary Care Hattie Aguinaldo: Theodora Blow, [place] Other Clinician: Referring Maysie Parkhill: Treating Gene Glazebrook/Extender: Sharlyn Bologna, MIKE Weeks in Treatment: 183 Encounter Discharge Information Items Discharge Condition: Stable Ambulatory Status: Stretcher Discharge Destination: Home Transportation: Ambulance Accompanied By: Hillary Bow Schedule Follow-up Appointment: Yes Clinical Summary of Care: Patient Declined Electronic Signature(s) Signed: 07/07/2021 4:31:32 PM By: Sharyn Creamer RN, BSN Entered By: Sharyn Creamer on 07/05/2021 16:18:10 -------------------------------------------------------------------------------- Lower Extremity Assessment Details Patient Name: Date of Service: [name], [name] 07/05/2021 10:45 A M Medical Record Number: [id number] Patient Account Number: [id number] Date of Birth/Sex: Treating RN: 1945-09-21 (76 y.o. Male) Sharyn Creamer Primary Care Jamelle Goldston: Theodora Blow, [place] Other Clinician: Referring Esmirna Ravan: Treating Rupert Azzara/Extender: Sharlyn Bologna, MIKE Weeks in Treatment: 183 Edema Assessment Assessed: [Left: No] [Right: No] Edema: [Left: No] [Right: No] Calf Left: Right: Point of Measurement: From Medial Instep 31.5 cm 33 cm Ankle Left: Right: Point of Measurement: From Medial Instep 17 cm 19 cm Vascular Assessment Pulses: Gubler, Azlaan R ([id number]) [Right:[id number] Page 4 of 11] Dorsalis Pedis Palpable: [Left:Yes] [Right:Yes] Electronic Signature(s) Signed: 10/04/2021 2:00:58 PM By: Deon Pilling RN, BSN Signed: 10/06/2021 10:27:13 AM By: Sharyn Creamer RN, BSN Previous Signature: 07/07/2021 4:31:32 PM Version By: Sharyn Creamer RN, BSN Entered By: Deon Pilling on 10/04/2021  13:14:45 -------------------------------------------------------------------------------- Multi Wound Chart Details Patient Name: Date of Service: Regino Schultze RD R. 07/05/2021 10:45 A M Medical Record Number: [id number] Patient Account Number: [id number] Date of Birth/Sex: Treating RN: 1945/12/07 (76 y.o. Male) Deon Pilling Primary Care Devra Stare: Theodora Blow, MIKE Other Clinician: Referring Dachelle Molzahn: Treating Sharday Michl/Extender: Sharlyn Bologna, MIKE Weeks in Treatment: 183 Vital Signs Height(in): 67 Pulse(bpm): 74 Weight(lbs): 130 Blood Pressure(mmHg): 142/74 Body Mass Index(BMI): 20.4 Temperature(F): 98.4 Respiratory  Rate(breaths/min): 18 [24:Photos:] Left, Anterior Lower Leg Right, Anterior Lower Leg Right Wrist Wound Location: Gradually Appeared Gradually Appeared Gradually Appeared Wounding Event: Lymphedema Lymphedema Pressure Ulcer Primary Etiology: Anemia, Hypertension, Type II Anemia, Hypertension, Type II Anemia, Hypertension, Type II Comorbid History: Diabetes, Paraplegia Diabetes, Paraplegia Diabetes, Paraplegia 09/14/2020 09/14/2020 05/10/2021 Date Acquired: 42 42 8 Weeks of Treatment: Open Open Open Wound Status: No No No Wound Recurrence: 7x6x0.1 0.2x0.2x0.1 0.1x0.1x0.1 Measurements L x W x D (cm) 32.987 0.031 0.008 A (cm) : rea 3.299 0.003 0.001 Volume (cm) : 91.70% 100.00% 99.70% % Reduction in Area: 91.70% 100.00% 99.60% % Reduction in Volume: Full Thickness Without Exposed Full Thickness Without Exposed Category/Stage II Classification: Support Structures Support Structures Medium Medium Medium Exudate A mount: Serosanguineous Serosanguineous Serosanguineous Exudate Type: red, brown red, brown red, brown Exudate Color: Flat and Intact Flat and Intact Flat and Intact Wound Margin: Large (67-100%) Large (67-100%) None Present (0%) Granulation Amount: Pink Pink N/A Granulation Quality: None Present (0%) None Present (0%) Large  (67-100%) Necrotic Amount: N/A N/A Eschar Necrotic Tissue: Fat Layer (Subcutaneous Tissue): Yes Fat Layer (Subcutaneous Tissue): Yes Fat Layer (Subcutaneous Tissue): Yes Exposed Structures: Fascia: No Fascia: No Fascia: No Tendon: No Tendon: No Tendon: No Muscle: No Muscle: No Muscle: No Joint: No Joint: No Joint: No Bone: No Bone: No Bone: No Large (67-100%) Large (67-100%) None Epithelialization: Treatment Notes BILLYJACK, TROMPETER ([id number]) [id number] Page 5 of 11 Wound #24 (Lower Leg) Wound Laterality: Left, Anterior Cleanser Soap and Water Discharge Instruction: May shower and wash wound with dial antibacterial soap and water prior to dressing change. Peri-Wound Care Sween Lotion (Moisturizing lotion) Discharge Instruction: Apply moisturizing lotion as directed Topical Triamcinolone Discharge Instruction: Apply thin layer of Triamcinolone as directed Primary Dressing KerraCel Ag Gelling Fiber Dressing, 4x5 in (silver alginate) Discharge Instruction: Apply silver alginate to wound bed as instructed Secondary Dressing ABD Pad, 8x10 Discharge Instruction: Apply over primary dressing as directed. Secured With [employer], 4.5x3.1 (in/yd) Discharge Instruction: Secure with Kerlix as directed. 11M Medipore Soft Cloth Surgical T 2x10 (in/yd) ape Discharge Instruction: Secure with tape as directed. Compression Wrap Compression Stockings Add-Ons Wound #25 (Lower Leg) Wound Laterality: Right, Anterior Cleanser Soap and Water Discharge Instruction: May shower and wash wound with dial antibacterial soap and water prior to dressing change. Peri-Wound Care Sween Lotion (Moisturizing lotion) Discharge Instruction: Apply moisturizing lotion as directed Topical Triamcinolone Discharge Instruction: Apply thin layer of Triamcinolone as directed Primary Dressing KerraCel Ag Gelling Fiber Dressing, 4x5 in (silver alginate) Discharge  Instruction: Apply silver alginate to wound bed as instructed Secondary Dressing ABD Pad, 8x10 Discharge Instruction: Apply over primary dressing as directed. Secured With [employer], 4.5x3.1 (in/yd) Discharge Instruction: Secure with Kerlix as directed. 11M Medipore Soft Cloth Surgical T 2x10 (in/yd) ape Discharge Instruction: Secure with tape as directed. Compression Wrap Compression Stockings Add-Ons Wound #29 (Wrist) Wound Laterality: Right Cleanser Soap and Water Discharge Instruction: May shower and wash wound with dial antibacterial soap and water prior to dressing change. CHRISTO, HAIN ([id number]) [id number] Page 6 of 11 Peri-Wound Care Topical Primary Dressing Xeroform Occlusive Gauze Dressing, 4x4 in Discharge Instruction: Apply to wound bed as instructed Secondary Dressing Cosmopor Wound Dressing, 4x4 (in/in) (Border Gauze) Discharge Instruction: Apply bordered gauze or foam border over primary dressing. Secured With Compression Wrap Compression Stockings Environmental education officer) Signed: 10/23/2021 9:08:19 AM By: Deon Pilling RN, BSN Previous Signature: 10/06/2021 10:46:05 AM Version By: Deon Pilling RN, BSN Previous Signature: 07/26/2021 10:33:22 AM  Version By: Baruch Gouty RN, BSN Previous Signature: 07/05/2021 11:47:38 AM Version By: Fredirick Maudlin MD FACS Entered By: Deon Pilling on 10/23/2021 09:08:18 -------------------------------------------------------------------------------- Multi-Disciplinary Care Plan Details Patient Name: Date of Service: [name], [name] 07/05/2021 10:45 A M Medical Record Number: [id number] Patient Account Number: [id number] Date of Birth/Sex: Treating RN: [birth year] (76 y.o. Male) Sharyn Creamer Primary Care Clarissia Mckeen: Theodora Blow, MIKE Other Clinician: Referring Kasyn Rolph: Treating Rush Salce/Extender: Sharlyn Bologna, MIKE Weeks in Treatment: [place] reviewed with physician Active Inactive Wound/Skin Impairment Nursing Diagnoses: Knowledge deficit related to ulceration/compromised skin integrity Goals: Patient/caregiver will verbalize understanding of skin care regimen Date Initiated: 05/20/2019 Target Resolution Date: 08/03/2021 Goal Status: Active Ulcer/skin breakdown will have a volume reduction of 30% by week 4 Date Initiated: 12/31/2017 Date Inactivated: 05/20/2019 Target Resolution Date: 06/18/2018 Goal Status: Met Interventions: Assess patient/caregiver ability to obtain necessary supplies Assess patient/caregiver ability to perform ulcer/skin care regimen upon admission and as needed Assess ulceration(s) every visit Notes: Electronic Signature(s) Signed: 07/07/2021 4:31:32 PM By: Sharyn Creamer RN, BSN Signed: 10/23/2021 8:05:39 AM By: Deon Pilling RN, BSN Entered By: Sharyn Creamer on 07/05/2021 16:15:15 JERL, MUNYAN R ([id number]) [id number] Page 7 of 11 -------------------------------------------------------------------------------- Pain Assessment Details Patient Name: Date of Service: KIM, LAUVER RD R. 07/05/2021 10:45 A M Medical Record Number: [id number] Patient Account Number: [id number] Date of Birth/Sex: Treating RN: 10-11-1945 (76 y.o. Male) Sharyn Creamer Primary Care Cutter Passey: Theodora Blow, MIKE Other Clinician: Referring Desten Manor: Treating Meleny Tregoning/Extender: Sharlyn Bologna, MIKE Weeks in Treatment: [phone] Active Problems Location of Pain Severity and Description of Pain Patient Has Paino No Site Locations Pain Management and Medication Current Pain Management: Electronic Signature(s) Signed: 10/04/2021 2:00:58 PM By: Deon Pilling RN, BSN Signed: 10/06/2021 10:27:13 AM By: Sharyn Creamer RN, BSN Previous Signature: 07/07/2021 4:31:32 PM Version By: Sharyn Creamer RN, BSN Entered By: Deon Pilling on 10/04/2021  13:14:40 -------------------------------------------------------------------------------- Patient/Caregiver Education Details Patient Name: Date of Service: Regino Schultze [place]. 6/21/2023andnbsp10:45 A M Medical Record Number: [id number] Patient Account Number: [id number] Date of Birth/Gender: Treating RN: 1945/12/09 (76 y.o. Male) Sharyn Creamer Primary Care Physician: Theodora Blow, [place] Other Clinician: Referring Physician: Treating Physician/Extender: Sharlyn Bologna, MIKE Weeks in Treatment: 62 Education Assessment Education Provided To: Patient Education Topics Provided Wound/Skin Impairment: Methods: Explain/Verbal Responses: State content correctly SHAFER, SWAMY R [id number]) [id number] Page 8 of 11 Electronic Signature(s) Signed: 07/07/2021 4:31:32 PM By: Sharyn Creamer RN, BSN Entered By: Sharyn Creamer on 07/05/2021 16:15:36 -------------------------------------------------------------------------------- Wound Assessment Details Patient Name: Date of Service: Regino Schultze RD R. 07/05/2021 10:45 A M Medical Record Number: [id number] Patient Account Number: [id number] Date of Birth/Sex: Treating RN: 01-10-1946 (76 y.o. Male) Sharyn Creamer Primary Care Tansy Lorek: Theodora Blow, MIKE Other Clinician: Referring Artemisia Auvil: Treating Clydell Sposito/Extender: Sharlyn Bologna, MIKE Weeks in Treatment: 183 Wound Status Wound Number: 24 Primary Etiology: Lymphedema Wound Location: Left, Anterior Lower Leg Wound Status: Open Wounding Event: Gradually Appeared Comorbid History: Anemia, Hypertension, Type II Diabetes, Paraplegia Date Acquired: 09/14/2020 Weeks Of Treatment: 42 Clustered Wound: No Photos Wound Measurements Length: (cm) 7 Width: (cm) 6 Depth: (cm) 0.1 Area: (cm) 32.987 Volume: (cm) 3.299 % Reduction in Area: 91.7% % Reduction in Volume: 91.7% Epithelialization: Large (67-100%) Tunneling: No Undermining: No Wound  Description Classification: Full Thickness Without Exposed Suppor Wound Margin: Flat and Intact Exudate Amount: Medium Exudate Type: Serosanguineous Exudate Color: red, brown t Structures Foul Odor After Cleansing: No Slough/Fibrino No Wound Bed Granulation Amount: Large (67-100%) Exposed Structure Granulation  Quality: Pink Fascia Exposed: No Necrotic Amount: None Present (0%) Fat Layer (Subcutaneous Tissue) Exposed: Yes Tendon Exposed: No Muscle Exposed: No Joint Exposed: No Bone Exposed: No Electronic Signature(s) Signed: 07/07/2021 4:31:32 PM By: Sharyn Creamer RN, BSN Entered By: Sharyn Creamer on 07/05/2021 11:32:43 Fullbright, Tysean R ([id number]) [id number] Page 9 of 11 -------------------------------------------------------------------------------- Wound Assessment Details Patient Name: Date of Service: LASSEN, [name] 07/05/2021 10:45 A M Medical Record Number: [id number] Patient Account Number: [id number] Date of Birth/Sex: Treating RN: May 31, 1945 (76 y.o. Male) Sharyn Creamer Primary Care Deamber Buckhalter: Theodora Blow, MIKE Other Clinician: Referring Vernesha Talbot: Treating Samhita Kretsch/Extender: Sharlyn Bologna, MIKE Weeks in Treatment: 183 Wound Status Wound Number: 25 Primary Etiology: Lymphedema Wound Location: Right, Anterior Lower Leg Wound Status: Open Wounding Event: Gradually Appeared Comorbid History: Anemia, Hypertension, Type II Diabetes, Paraplegia Date Acquired: 09/14/2020 Weeks Of Treatment: 42 Clustered Wound: No Photos Wound Measurements Length: (cm) 0.2 Width: (cm) 0.2 Depth: (cm) 0.1 Area: (cm) 0.031 Volume: (cm) 0.003 % Reduction in Area: 100% % Reduction in Volume: 100% Epithelialization: Large (67-100%) Tunneling: No Undermining: No Wound Description Classification: Full Thickness Without Exposed Suppor Wound Margin: Flat and Intact Exudate Amount: Medium Exudate Type: Serosanguineous Exudate Color: red,  brown t Structures Foul Odor After Cleansing: No Slough/Fibrino No Wound Bed Granulation Amount: Large (67-100%) Exposed Structure Granulation Quality: Pink Fascia Exposed: No Necrotic Amount: None Present (0%) Fat Layer (Subcutaneous Tissue) Exposed: Yes Tendon Exposed: No Muscle Exposed: No Joint Exposed: No Bone Exposed: No Electronic Signature(s) Signed: 07/07/2021 4:31:32 PM By: Sharyn Creamer RN, BSN Entered By: Sharyn Creamer on 07/05/2021 11:33:21 CRIT, OBREMSKI R ([id number]) [id number] Page 10 of 11 -------------------------------------------------------------------------------- Wound Assessment Details Patient Name: Date of Service: PER, BEAGLEY RD R. 07/05/2021 10:45 A M Medical Record Number: [id number] Patient Account Number: [id number] Date of Birth/Sex: Treating RN: June 27, 1945 (76 y.o. Male) Sharyn Creamer Primary Care Marlyn Rabine: Theodora Blow, MIKE Other Clinician: Referring Katja Blue: Treating Tamalyn Wadsworth/Extender: Sharlyn Bologna, MIKE Weeks in Treatment: 183 Wound Status Wound Number: 29 Primary Etiology: Pressure Ulcer Wound Location: Right Wrist Wound Status: Open Wounding Event: Gradually Appeared Comorbid History: Anemia, Hypertension, Type II Diabetes, Paraplegia Date Acquired: 05/10/2021 Weeks Of Treatment: 8 Clustered Wound: No Photos Wound Measurements Length: (cm) 0.1 Width: (cm) 0.1 Depth: (cm) 0.1 Area: (cm) 0.008 Volume: (cm) 0.001 % Reduction in Area: 99.7% % Reduction in Volume: 99.6% Epithelialization: None Tunneling: No Undermining: No Wound Description Classification: Category/Stage II Wound Margin: Flat and Intact Exudate Amount: Medium Exudate Type: Serosanguineous Exudate Color: red, brown Foul Odor After Cleansing: No Slough/Fibrino No Wound Bed Granulation Amount: None Present (0%) Exposed Structure Necrotic Amount: Large (67-100%) Fascia Exposed: No Necrotic Quality: Eschar Fat Layer  (Subcutaneous Tissue) Exposed: Yes Tendon Exposed: No Muscle Exposed: No Joint Exposed: No Bone Exposed: No Electronic Signature(s) Signed: 07/07/2021 4:31:32 PM By: Sharyn Creamer RN, BSN Entered By: Sharyn Creamer on 07/05/2021 11:34:55 -------------------------------------------------------------------------------- Convoy Details Patient Name: Date of Service: Regino Schultze RD R. 07/05/2021 10:45 A M Medical Record Number: [id number] Patient Account Number: [id number] Date of Birth/Sex: Treating RN: 03-08-1945 (76 y.o. Male) Sharyn Creamer Primary Care Ayala Ribble: Theodora Blow, MIKE Other Clinician: Referring Maxmilian Trostel: Treating Vannesa Abair/Extender: Sharlyn Bologna, Howard, Gully ([id number]) [id number] Page 11 of 11 Weeks in Treatment: 183 Vital Signs Time Taken: 11:22 Temperature (F): 98.4 Height (in): 67 Pulse (bpm): 74 Weight (lbs): 130 Respiratory Rate (breaths/min): 18 Body Mass Index (BMI): 20.4 Blood Pressure (mmHg): 142/74 Reference Range: 80 - 120 mg / dl Electronic Signature(s)  Signed: 10/04/2021 2:00:58 PM By: Deon Pilling RN, BSN Previous Signature: 07/07/2021 4:31:32 PM Version By: Sharyn Creamer RN, BSN Entered By: Deon Pilling on 10/04/2021 13:14:17

## 2021-10-25 ENCOUNTER — Encounter (HOSPITAL_BASED_OUTPATIENT_CLINIC_OR_DEPARTMENT_OTHER): Payer: Medicare Other | Attending: General Surgery | Admitting: General Surgery

## 2021-10-25 DIAGNOSIS — Z8673 Personal history of transient ischemic attack (TIA), and cerebral infarction without residual deficits: Secondary | ICD-10-CM | POA: Diagnosis not present

## 2021-10-25 DIAGNOSIS — Z8616 Personal history of COVID-19: Secondary | ICD-10-CM | POA: Diagnosis not present

## 2021-10-25 DIAGNOSIS — Z7984 Long term (current) use of oral hypoglycemic drugs: Secondary | ICD-10-CM | POA: Diagnosis not present

## 2021-10-25 DIAGNOSIS — B351 Tinea unguium: Secondary | ICD-10-CM | POA: Diagnosis not present

## 2021-10-25 DIAGNOSIS — R54 Age-related physical debility: Secondary | ICD-10-CM | POA: Diagnosis not present

## 2021-10-25 DIAGNOSIS — S80829D Blister (nonthermal), unspecified lower leg, subsequent encounter: Secondary | ICD-10-CM | POA: Diagnosis not present

## 2021-10-25 DIAGNOSIS — L97909 Non-pressure chronic ulcer of unspecified part of unspecified lower leg with unspecified severity: Secondary | ICD-10-CM | POA: Diagnosis not present

## 2021-10-25 DIAGNOSIS — G8250 Quadriplegia, unspecified: Secondary | ICD-10-CM | POA: Diagnosis not present

## 2021-10-25 DIAGNOSIS — E11622 Type 2 diabetes mellitus with other skin ulcer: Secondary | ICD-10-CM | POA: Insufficient documentation

## 2021-10-25 DIAGNOSIS — S60811A Abrasion of right wrist, initial encounter: Secondary | ICD-10-CM | POA: Diagnosis not present

## 2021-10-25 DIAGNOSIS — S68411D Complete traumatic amputation of right hand at wrist level, subsequent encounter: Secondary | ICD-10-CM | POA: Insufficient documentation

## 2021-10-25 DIAGNOSIS — L89013 Pressure ulcer of right elbow, stage 3: Secondary | ICD-10-CM | POA: Insufficient documentation

## 2021-10-25 DIAGNOSIS — E1151 Type 2 diabetes mellitus with diabetic peripheral angiopathy without gangrene: Secondary | ICD-10-CM | POA: Diagnosis not present

## 2021-10-25 NOTE — Progress Notes (Signed)
[name] [name] ([id number]) [id number] Page 1 of 11 Visit Report for 10/25/2021 Arrival Information Details Patient Name: Date of Service: [name], [name] RD [name]. 10/25/2021 10:45 A M Medical Record Number: [id number] Patient Account Number: [id number] Date of Birth/Sex: Treating RN: [name] [name] (76 y.o. [name] [name] Primary Care : [name] [name], [name] [name] Other Clinician: Referring : Treating /Extender: [name] [name], [name] [name] Weeks in Treatment: 199 Visit Information History Since Last Visit Added or deleted any medications: No Patient Arrived: Stretcher Any new allergies or adverse reactions: No Arrival Time: 11:30 Had a fall or experienced change in No Accompanied By: self activities of daily living that may affect Transfer Assistance: None risk of falls: Patient Identification Verified: Yes Signs or symptoms of abuse/neglect since last visito No Secondary Verification Process Completed: Yes Hospitalized since last visit: No Patient Requires Transmission-Based Precautions: No Implantable device outside of the clinic excluding No Patient Has Alerts: Yes cellular tissue based products placed in the center Patient Alerts: [name] ABI non compressible since last visit: Has Dressing in Place as Prescribed: Yes Pain Present Now: No Electronic Signature(s) Signed: 10/25/2021 5:03:04 PM By: [name] [name] Entered By: [name] [name] on 10/25/2021 11:31:39 -------------------------------------------------------------------------------- Clinic Level of Care Assessment Details Patient Name: Date of Service: [name], [name] RD [name]. 10/25/2021 10:45 A M Medical Record Number: [id number] Patient Account Number: [id number] Date of Birth/Sex: Treating RN: [name] [name] (76 y.o. [name] [name] Primary Care : [name] [name], [name] [name] Other Clinician: Referring : Treating /Extender: [name] [name], [name] [name] Weeks in  Treatment: 199 Clinic Level of Care Assessment Items TOOL 4 Quantity Score X- 1 0 Use when only an EandM is performed on FOLLOW-UP visit ASSESSMENTS - Nursing Assessment / Reassessment X- 1 10 Reassessment of Co-morbidities (includes updates in patient status) X- 1 5 Reassessment of Adherence to Treatment Plan ASSESSMENTS - Wound and Skin A ssessment / Reassessment X - Simple Wound Assessment / Reassessment - one wound 1 5 [] - 0 Complex Wound Assessment / Reassessment - multiple wounds [] - 0 Dermatologic / Skin Assessment (not related to wound area) ASSESSMENTS - Focused Assessment [] - 0 Circumferential Edema Measurements - multi extremities [] - 0 Nutritional Assessment / Counseling / Intervention [name] [name] ([id number]) [id number] Page 2 of 11 [] - 0 Lower Extremity Assessment (monofilament, tuning fork, pulses) [] - 0 Peripheral Arterial Disease Assessment (using hand held doppler) ASSESSMENTS - Ostomy and/or Continence Assessment and Care [] - 0 Incontinence Assessment and Management [] - 0 Ostomy Care Assessment and Management (repouching, etc.) PROCESS - Coordination of Care X - Simple Patient / Family Education for ongoing care 1 15 [] - 0 Complex (extensive) Patient / Family Education for ongoing care X- 1 10 Staff obtains Programmer, systems, Records, T Results / Process Orders est [] - 0 Staff telephones HHA, Nursing Homes / Clarify orders / etc [] - 0 Routine Transfer to another Facility (non-emergent condition) [] - 0 Routine Hospital Admission (non-emergent condition) [] - 0 New Admissions / Biomedical engineer / Ordering NPWT Apligraf, etc. , [] - 0 Emergency Hospital Admission (emergent condition) X- 1 10 Simple Discharge Coordination [] - 0 Complex (extensive) Discharge Coordination PROCESS - Special Needs [] - 0 Pediatric / Minor Patient Management [] - 0 Isolation Patient Management [] - 0 Hearing / Language /  Visual special needs [] - 0 Assessment of Community assistance (transportation, [name]/C planning, etc.) [] - 0 Additional assistance / Altered mentation [] - 0 Support Surface(s) Assessment (bed, cushion, seat, etc.) INTERVENTIONS -  Wound Cleansing / Measurement X - Simple Wound Cleansing - one wound 1 5 [] - 0 Complex Wound Cleansing - multiple wounds X- 1 5 Wound Imaging (photographs - any number of wounds) [] - 0 Wound Tracing (instead of photographs) X- 1 5 Simple Wound Measurement - one wound [] - 0 Complex Wound Measurement - multiple wounds INTERVENTIONS - Wound Dressings [] - 0 Small Wound Dressing one or multiple wounds X- 1 15 Medium Wound Dressing one or multiple wounds [] - 0 Large Wound Dressing one or multiple wounds [] - 0 Application of Medications - topical [] - 0 Application of Medications - injection INTERVENTIONS - Miscellaneous [] - 0 External ear exam [] - 0 Specimen Collection (cultures, biopsies, blood, body fluids, etc.) [] - 0 Specimen(s) / Culture(s) sent or taken to Lab for analysis [] - 0 Patient Transfer (multiple staff / Civil Service fast streamer / Similar devices) [] - 0 Simple Staple / Suture removal (25 or less) [] - 0 Complex Staple / Suture removal (26 or more) [] - 0 Hypo / Hyperglycemic Management (close monitor of Blood Glucose) [name] [name], [name] [name] ([id number]) [id number] Page 3 of 11 [] - 0 Ankle / Brachial Index (ABI) - do not check if billed separately X- 1 5 Vital Signs Has the patient been seen at the hospital within the last three years: Yes Total Score: 90 Level Of Care: New/Established - Level 3 Electronic Signature(s) Signed: 10/25/2021 5:03:04 PM By: [name] [name] Entered By: [name] [name] on 10/25/2021 11:35:11 -------------------------------------------------------------------------------- Encounter Discharge Information Details Patient Name: Date of Service: [name] [name], [name] RD [name]. 10/25/2021 10:45  A M Medical Record Number: [id number] Patient Account Number: [id number] Date of Birth/Sex: Treating RN: [name] [name] (76 y.o. [name] [name] Primary Care : [name] [name], [name] [name] Other Clinician: Referring : Treating /Extender: [name] [name], [name] [name] Weeks in Treatment: 199 Encounter Discharge Information Items Discharge Condition: Stable Ambulatory Status: Stretcher Discharge Destination: Home Transportation: Private Auto Accompanied By: self Schedule Follow-up Appointment: Yes Clinical Summary of Care: Patient Declined Electronic Signature(s) Signed: 10/25/2021 5:03:04 PM By: Sabas Sous By: [name] [name] on 10/25/2021 11:35:43 -------------------------------------------------------------------------------- Lower Extremity Assessment Details Patient Name: Date of Service: [name] [name], [name] RD [name]. 10/25/2021 10:45 A M Medical Record Number: [id number] Patient Account Number: [id number] Date of Birth/Sex: Treating RN: [birth year] [name] (76 y.o. [name] [name] Primary Care : [name] [name], [name] [name] Other Clinician: Referring : Treating /Extender: [name] [name], [name] [name] Weeks in Treatment: 199 Edema Assessment Assessed: [Left: No] [Right: No] Edema: [Left: No] [Right: No] Calf Left: Right: Point of Measurement: From Medial Instep 36.6 cm 38.4 cm Ankle Left: Right: Point of Measurement: From Medial Instep 21.8 cm 19 cm Electronic Signature(s) Signed: 10/25/2021 5:03:04 PM By: Hinda Lenis, Carolynn Sayers ([id number]) PM By: Gus Puma.pdf Page 4 of 11 Signed: 10/25/2021 5:03:04 aylor Entered By: [name] [name] on 10/25/2021 11:31:44 -------------------------------------------------------------------------------- Multi Wound Chart Details Patient Name: Date of Service: [name] [name], [name] RD [name]. 10/25/2021 10:45 A M Medical Record Number: [id number] Patient Account  Number: [id number] Date of Birth/Sex: Treating RN: [name] (76 y.o. M) Primary Care : [name] [name], [name] [name] Other Clinician: Referring : Treating /Extender: [name] [name], [name] [name] Weeks in Treatment: 199 Vital Signs Height(in): 67 Pulse(bpm): 82 Weight(lbs): 130 Blood Pressure(mmHg): 168/94 Body Mass Index(BMI): 20.4 Temperature(F): 97.9 Respiratory Rate(breaths/min): 18 [23:Photos:] Right Elbow Left, Medial Lower Leg Right, Lateral Lower Leg Wound Location: Pressure Injury Skin T ear/Laceration Skin T ear/Laceration Wounding Event: Pressure Ulcer Skin T ear Skin T ear  Primary Etiology: Anemia, Hypertension, Type II Anemia, Hypertension, Type II Anemia, Hypertension, Type II Comorbid History: Diabetes, Paraplegia Diabetes, Paraplegia Diabetes, Paraplegia 05/26/2020 08/30/2021 08/30/2021 Date Acquired: 68 8 8 Weeks of Treatment: Open Open Open Wound Status: No No No Wound Recurrence: 0.3x0.3x0.1 3.1x1.5x0.1 0.6x0.4x0.1 Measurements L x W x [name] (cm) 0.071 3.652 0.188 A (cm) : rea 0.007 0.365 0.019 Volume (cm) : 99.10% -7670.20% 77.20% % Reduction in Area: 99.90% -7200.00% 76.80% % 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 Full Thickness Without Exposed Full Thickness Without Exposed Classification: Support Structures Support Structures Medium Small Small Exudate Amount: Serosanguineous Serous Serosanguineous Exudate Type: red, brown amber red, brown Exudate Color: Well defined, not attached Distinct, outline attached Distinct, outline attached Wound Margin: Large (67-100%) Large (67-100%) Large (67-100%) Granulation Amount: Red Red Red Granulation Quality: None Present (0%) None Present (0%) None Present (0%) Necrotic Amount: Fat Layer (Subcutaneous Tissue): Yes Fat Layer (Subcutaneous Tissue): Yes Fat Layer (Subcutaneous Tissue): Yes Exposed Structures: Fascia: No Fascia: No Fascia: No Tendon: No Tendon: No Tendon: No Muscle:  No Muscle: No Muscle: No Joint: No Joint: No Joint: No Bone: No Bone: No Bone: No Medium (34-66%) Small (1-33%) Medium (34-66%) Epithelialization: No Abnormalities Noted No Abnormalities Noted No Abnormalities Noted Periwound Skin Texture: Dry/Scaly: Yes Dry/Scaly: Yes Dry/Scaly: Yes Periwound Skin Moisture: No Abnormalities Noted No Abnormalities Noted No Abnormalities Noted Periwound Skin Color: No Abnormality No Abnormality No Abnormality Temperature: Treatment Notes Electronic Signature(s) Signed: 10/25/2021 11:11:26 AM By: Fredirick Maudlin MD [place], Juarez ([id number]) [id number] Page 5 of 11 Entered By: Fredirick Maudlin on 10/25/2021 11:11:26 -------------------------------------------------------------------------------- Multi-Disciplinary Care Plan Details Patient Name: Date of Service: [name] [name], [name] RD [name]. 10/25/2021 10:45 A M Medical Record Number: [id number] Patient Account Number: [id number] Date of Birth/Sex: Treating RN: [name] (76 y.o. [name] [name] Primary Care : [name] [name], [name] [name] Other Clinician: Referring : Treating /Extender: [name] [name], [name] [name] Weeks in Treatment: [place] reviewed with physician Active Inactive Wound/Skin Impairment Nursing Diagnoses: Knowledge deficit related to ulceration/compromised skin integrity Goals: Patient/caregiver will verbalize understanding of skin care regimen Date Initiated: 05/20/2019 Target Resolution Date: 12/15/2021 Goal Status: Active Ulcer/skin breakdown will have a volume reduction of 30% by week 4 Date Initiated: 12/31/2017 Date Inactivated: 05/20/2019 Target Resolution Date: 06/18/2018 Goal Status: Met Interventions: Assess patient/caregiver ability to obtain necessary supplies Assess patient/caregiver ability to perform ulcer/skin care regimen upon admission and as needed Assess ulceration(s) every  visit Notes: Electronic Signature(s) Signed: 10/25/2021 5:03:04 PM By: [name] [name] Entered By: [name] [name] on 10/25/2021 11:34:30 -------------------------------------------------------------------------------- Pain Assessment Details Patient Name: Date of Service: [name] [name], [name] RD [name]. 10/25/2021 10:45 A M Medical Record Number: [id number] Patient Account Number: [id number] Date of Birth/Sex: Treating RN: [birth year] [name] (76 y.o. [name] [name] Primary Care : [name] [name], [name] [name] Other Clinician: Referring : Treating /Extender: [name] [name], [name] [name] Weeks in Treatment: 199 Active Problems Location of Pain Severity and Description of Pain Patient Has Paino No Site Locations Rate the pain. [name] [name], [name] [name] ([id number]) [id number] Page 6 of 11 Rate the pain. Current Pain Level: 0 Pain Management and Medication Current Pain Management: Electronic Signature(s) Signed: 10/25/2021 5:03:04 PM By: [name] [name] Entered By: [name] [name] on 10/25/2021 10:51:50 -------------------------------------------------------------------------------- Patient/Caregiver Education Details Patient Name: Date of Service: [name] Schultze RD [name]. 10/11/2023andnbsp10:45 A M Medical Record Number: [id number] Patient Account Number: [id number] Date of Birth/Gender: Treating RN: [name] (76 y.o. [name] [name] Primary Care Physician: [name] [name], [name] [name] Other Clinician: Referring Physician: Treating Physician/Extender: Fredirick Maudlin  [place] SSEMI, [name] [name] Weeks in Treatment: 199 Education Assessment Education Provided To: Patient Education Topics Provided Wound/Skin Impairment: Methods: Explain/Verbal Responses: Reinforcements needed, State content correctly Electronic Signature(s) Signed: 10/25/2021 5:03:04 PM By: [name] [name] Entered By: [name] [name] on 10/25/2021  11:34:41 -------------------------------------------------------------------------------- Wound Assessment Details Patient Name: Date of Service: [name] [name], [name] RD [name]. 10/25/2021 10:45 A M Medical Record Number: [id number] Patient Account Number: [id number] Date of Birth/Sex: Treating RN: [birth year] [name] (76 y.o. [name] [name] Primary Care Calil Amor: [name] [name], [name] [name] Other Clinician: Referring Shaughnessy Gethers: Treating Ajla Mcgeachy/Extender: [name] [name], [place], [place] ([id number]) [id number] Page 7 of 11 Weeks in Treatment: 199 Wound Status Wound Number: 23 Primary Etiology: Pressure Ulcer Wound Location: Right Elbow Wound Status: Open Wounding Event: Pressure Injury Comorbid History: Anemia, Hypertension, Type II Diabetes, Paraplegia Date Acquired: 05/26/2020 Weeks Of Treatment: 73 Clustered Wound: No Photos Wound Measurements Length: (cm) 0.3 Width: (cm) 0.3 Depth: (cm) 0.1 Area: (cm) 0.071 Volume: (cm) 0.007 % Reduction in Area: 99.1% % Reduction in Volume: 99.9% Epithelialization: Medium (34-66%) Tunneling: No Undermining: No Wound Description Classification: Category/Stage IV Wound Margin: Well defined, not attached Exudate Amount: Medium Exudate Type: Serosanguineous Exudate Color: red, brown Foul Odor After Cleansing: No Slough/Fibrino No Wound Bed Granulation Amount: Large (67-100%) Exposed Structure Granulation Quality: Red Fascia Exposed: No Necrotic Amount: Small (1-33%) Fat Layer (Subcutaneous Tissue) Exposed: Yes Necrotic Quality: Adherent Slough Tendon Exposed: No Muscle Exposed: No Joint Exposed: No Bone Exposed: No Periwound Skin Texture Texture Color No Abnormalities Noted: No No Abnormalities Noted: Yes Scarring: Yes Temperature / Pain Temperature: No Abnormality Moisture No Abnormalities Noted: No Dry / Scaly: Yes Electronic Signature(s) Signed: 10/25/2021 5:03:04 PM By: [name] [name] Entered  By: [name] [name] on 10/25/2021 11:34:09 -------------------------------------------------------------------------------- Wound Assessment Details Patient Name: Date of Service: [name] [name], [name] RD [name]. 10/25/2021 10:45 A M Medical Record Number: [id number] Patient Account Number: [id number] Date of Birth/Sex: Treating RN: [birth year] [name] (76 y.o. [name] [name] Primary Care Miyani Cronic: [name] [name], [name] [name] Other Clinician: IDEN, STRIPLING ([id number]) [id number] Page 8 of 11 Referring Md Smola: Treating Jobeth Pangilinan/Extender: [name] [name], [name] [name] Weeks in Treatment: 199 Wound Status Wound Number: 30 Primary Etiology: Skin Tear Wound Location: Left, Medial Lower Leg Wound Status: Open Wounding Event: Skin Tear/Laceration Comorbid History: Anemia, Hypertension, Type II Diabetes, Paraplegia Date Acquired: 08/30/2021 Weeks Of Treatment: 8 Clustered Wound: No Photos Wound Measurements Length: (cm) 3.1 Width: (cm) 1.5 Depth: (cm) 0.1 Area: (cm) 3.652 Volume: (cm) 0.365 % Reduction in Area: -7670.2% % Reduction in Volume: -7200% Epithelialization: Small (1-33%) Tunneling: No Undermining: No Wound Description Classification: Full Thickness Without Exposed Support Structures Wound Margin: Distinct, outline attached Exudate Amount: Small Exudate Type: Serous Exudate Color: amber Foul Odor After Cleansing: No Slough/Fibrino No Wound Bed Granulation Amount: Large (67-100%) Exposed Structure Granulation Quality: Red Fascia Exposed: No Necrotic Amount: None Present (0%) Fat Layer (Subcutaneous Tissue) Exposed: Yes Tendon Exposed: No Muscle Exposed: No Joint Exposed: No Bone Exposed: No Periwound Skin Texture Texture Color No Abnormalities Noted: Yes No Abnormalities Noted: Yes Moisture Temperature / Pain No Abnormalities Noted: No Temperature: No Abnormality Dry / Scaly: Yes Treatment Notes Wound #30 (Lower Leg) Wound Laterality: Left,  Medial Cleanser Soap and Water Discharge Instruction: May shower and wash wound with dial antibacterial soap and water prior to dressing change. Wound Cleanser Discharge Instruction: Cleanse the wound with wound cleanser prior to applying a clean dressing using gauze sponges, not tissue or cotton balls. Peri-Wound Care Triamcinolone 15 (g) Discharge Instruction: Use triamcinolone 15 (g)  as directed Sween Lotion (Moisturizing lotion) Discharge Instruction: Apply moisturizing lotion as directed Topical COLA, HIGHFILL [name] ([id number]) [id number] Page 9 of 11 Primary Dressing KerraCel Ag Gelling Fiber Dressing, 2x2 in (silver alginate) Discharge Instruction: Apply silver alginate to wound bed as instructed Secondary Dressing ABD Pad, 5x9 Discharge Instruction: Apply over primary dressing as directed. Secured With [employer], 4.5x3.1 (in/yd) Discharge Instruction: Secure with Kerlix as directed. 40M Medipore Soft Cloth Surgical T 2x10 (in/yd) ape Discharge Instruction: Secure with tape as directed. Compression Wrap Compression Stockings Add-Ons Electronic Signature(s) Signed: 10/25/2021 5:03:04 PM By: [name] [name] Entered By: [name] [name] on 10/25/2021 11:01:09 -------------------------------------------------------------------------------- Wound Assessment Details Patient Name: Date of Service: [name] [name], [name] RD [name]. 10/25/2021 10:45 A M Medical Record Number: [id number] Patient Account Number: [id number] Date of Birth/Sex: Treating RN: [birth year] (76 y.o. [name] [name] Primary Care Kolin Erdahl: [name] [name], [name] [name] Other Clinician: Referring Brodrick Curran: Treating Blessed Cotham/Extender: [name] [name], [name] [name] Weeks in Treatment: 199 Wound Status Wound Number: 31 Primary Etiology: Skin Tear Wound Location: Right, Lateral Lower Leg Wound Status: Open Wounding Event: Skin Tear/Laceration Comorbid History: Anemia, Hypertension, Type II  Diabetes, Paraplegia Date Acquired: 08/30/2021 Weeks Of Treatment: 8 Clustered Wound: No Photos Wound Measurements Length: (cm) 0.6 Width: (cm) 0.4 Depth: (cm) 0.1 Area: (cm) 0.188 Volume: (cm) 0.019 % Reduction in Area: 77.2% % Reduction in Volume: 76.8% Epithelialization: Medium (34-66%) Tunneling: No Undermining: No Wound Description Classification: Full Thickness Without Exposed Support Structures Wound Margin: Distinct, outline attached Exudate Amount: Small Wordell, Tayvian [name] ([id number]) Exudate Type: Serosanguineous Exudate Color: red, brown Foul Odor After Cleansing: No Slough/Fibrino No [id number] Page 10 of 11 Wound Bed Granulation Amount: Large (67-100%) Exposed Structure Granulation Quality: Red Fascia Exposed: No Necrotic Amount: None Present (0%) Fat Layer (Subcutaneous Tissue) Exposed: Yes Tendon Exposed: No Muscle Exposed: No Joint Exposed: No Bone Exposed: No Periwound Skin Texture Texture Color No Abnormalities Noted: Yes No Abnormalities Noted: Yes Moisture Temperature / Pain No Abnormalities Noted: No Temperature: No Abnormality Dry / Scaly: Yes Treatment Notes Wound #31 (Lower Leg) Wound Laterality: Right, Lateral Cleanser Soap and Water Discharge Instruction: May shower and wash wound with dial antibacterial soap and water prior to dressing change. Wound Cleanser Discharge Instruction: Cleanse the wound with wound cleanser prior to applying a clean dressing using gauze sponges, not tissue or cotton balls. Peri-Wound Care Triamcinolone 15 (g) Discharge Instruction: Use triamcinolone 15 (g) as directed Sween Lotion (Moisturizing lotion) Discharge Instruction: Apply moisturizing lotion as directed Topical Primary Dressing KerraCel Ag Gelling Fiber Dressing, 2x2 in (silver alginate) Discharge Instruction: Apply silver alginate to wound bed as instructed Secondary Dressing ABD Pad, 5x9 Discharge Instruction:  Apply over primary dressing as directed. Secured With [employer], 4.5x3.1 (in/yd) Discharge Instruction: Secure with Kerlix as directed. 40M Medipore Soft Cloth Surgical T 2x10 (in/yd) ape Discharge Instruction: Secure with tape as directed. Compression Wrap Compression Stockings Add-Ons Electronic Signature(s) Signed: 10/25/2021 5:03:04 PM By: [name] [name] Entered By: [name] [name] on 10/25/2021 11:01:38 -------------------------------------------------------------------------------- Vitals Details Patient Name: Date of Service: [name] [name], [name] Superior 10/25/2021 10:45 A M Medical Record Number: [id number] Patient Account Number: [id number] MATIN, MATTIOLI ([id number]) [id number] Page 11 of 11 Date of Birth/Sex: Treating RN: [birth year] (76 y.o. [name] [name] Primary Care Denora Wysocki: Other Clinician: Theodora [name], [name] [name] Referring Warda Mcqueary: Treating Helaina Stefano/Extender: [name] [name], [name] [name] Weeks in Treatment: 199 Vital Signs Time Taken: 10:51 Temperature (F): 97.9 Height (in): 67 Pulse (bpm): 82 Weight (lbs): 130 Respiratory Rate (breaths/min): 18 Body Mass  Index (BMI): 20.4 Blood Pressure (mmHg): 168/94 Reference Range: 80 - 120 mg / dl Electronic Signature(s) Signed: 10/25/2021 5:03:04 PM By: [name] [name] Entered By: [name] [name] on 10/25/2021 10:51:44

## 2021-10-26 NOTE — Progress Notes (Signed)
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 fluconazole once weekly. Apparently he is getting this medication and communication with his daughter through the dermatology office. He arrives in our clinic. We looked at his amputation site on the right arm there is a small open  area here. Sutures are still in. Our case manager communicated with the surgeon he has an appointment on September 7. She also talked to the patient's daughter he is no longer on colchicine, doxycycline ordered Diflucan but he is on the oral methylprednisolone as outlined by Dr. Lorain Childes. 9/21; since the patient was last here his daughter I think he has been aggressively washing dried thick flaking skin from his lower legs. He arrives with things actually looking a lot better however there is still large areas of inflammation and especially on the right greater than left blistering these are tense. Nevertheless the skin in his legs looks a lot better. oo He still has an area on the right olecranon that does not look quite as good as last time oo He has been using silver alginate to the area on the elbow. I am not exactly sure what he has been using on the skin on his legs. I did put in for some triamcinolone 1 pound I think with a repeat last time that is helped. He is not on oral steroids as far as I am aware. He was supposed to follow-up with dermatology Dr. Lorain Childes earlier this month but I believe they missed the appointment 10/25 1 month follow-up. He has been using triamcinolone on the inflamed blistered skin on his anterior lower legs. The other deep wound we had to find was a pressure area on the right elbow which I ordered Hydrofera Blue to last time but he came in with silver alginate. He arrives in clinic with 2 new wounds to the area on the left posterior heel with stage II pressure ulcer. He also has an area at the base of his right first toe plantar aspect. He is not on systemic steroids. His appointment with Dr. Lorain Childes is not until December 12/13 his appointment with dermatology Dr. Lorain Childes is tomorrow. Hopefully I will be able to keep this. Areas we had previously declined on his right first met head and left heel have healed he still  has an area on his right great toe, multiple blisters  on the anterior bilateral lower legs and a pressure ulcer on his rright elbow ROEN, MACGOWAN [name] ([id number]) [id number] Page 10 of 14 01/26/2021; Mr. Hoeg is now at [facility] after a stay at [facility] health I have not looked at this discharge summary. He was seen by dermatology on 12/28/2020. He is felt to have "pustular" inflammation of the skin. 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 They applied liberal TCA twice daily The patient had a wound on his right great toe that is healed however he still has a sizable pressure ulcer on the right elbow 2/23; I follow this patient on a palliative basis although both lower legs look a lot better since he went to the facility. They're applying Xeroform daily. There are no open blisters however he has denuded epithelium when removed still has open wounds. He also has a pressure ulcer over the olecranon of his right elbow. They've been using Hydrofera Blue here 05/10/2021: This is my first encounter with the patient, who was followed in the past by Dr. Dellia Nims. The elbow wound has healed. 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 07/05/2021: The ulcer on the volar surface of his right wrist is essentially closed with just a small pinpoint area that is open. 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 When his dressing was removed today, a small skin tear was identified on his posterior right calf. He has accumulated quite a bit of dead skin in plaques on both of his legs. 08/30/2021: 41-monthfollow-up. The old wounds have all healed, but he has reopened the pressure wound on his right elbow. It is stage III but very clean. On his legs, he has a very  superficial ulcer on his left medial leg and a similar-appearing lesion on his right lateral leg. His daughter has been performing his wound care. 10/25/2021: 222-monthollow-up. The pressure wound on his right elbow is nearly closed, with just a tiny open slit remaining. There are a couple of superficial open sites on his legs, in different locations than last visit. His right forearm is very edematous and erythematous, concerning for cellulitis. Patient History Information obtained from Patient. Social History Never smoker, Marital Status - Separated, Alcohol Use - Never, Drug Use - No History, Caffeine Use - Never. Medical History Eyes Denies history of Cataracts, Glaucoma, Optic Neuritis Ear/Nose/Mouth/Throat Denies history of Chronic sinus problems/congestion, Middle ear problems Hematologic/Lymphatic Patient has history of Anemia - Iron deficiency Denies history of Hemophilia, Human Immunodeficiency Virus, Lymphedema, Sickle Cell Disease Respiratory Denies history of Aspiration, Asthma, Chronic Obstructive Pulmonary Disease (COPD), Pneumothorax, Sleep Apnea, Tuberculosis Cardiovascular Patient has history of Hypertension Denies history of Angina, Arrhythmia, Congestive Heart Failure, Coronary Artery Disease, Deep Vein Thrombosis, Hypotension, Myocardial Infarction, Peripheral Arterial Disease, Peripheral Venous Disease, Phlebitis, Vasculitis Gastrointestinal Denies history of Cirrhosis , Colitis, Crohnoos, Hepatitis A, Hepatitis B, Hepatitis C Endocrine Patient has history of Type II Diabetes Denies history of Type I Diabetes Genitourinary Denies history of End Stage Renal Disease Immunological Denies history of Lupus Erythematosus, Raynaudoos, Scleroderma Integumentary (Skin) Denies history of History of Burn Musculoskeletal Denies history of Gout, Rheumatoid Arthritis, Osteoarthritis, Osteomyelitis Neurologic Patient has history of  Paraplegia Denies history of Dementia,  Neuropathy, Quadriplegia, Seizure Disorder Oncologic Denies history of Received Chemotherapy, Received Radiation Psychiatric Denies history of Anorexia/bulimia, Confinement Anxiety Hospitalization/Surgery History - Hand surgery. Objective Constitutional Hypertensive, asymptomatic. No acute distress.. Vitals Time Taken: 10:51 AM, Height: 67 in, Weight: 130 lbs, BMI: 20.4, Temperature: 97.9 F, Pulse: 82 bpm, Respiratory Rate: 18 breaths/min, Blood Pressure: 168/94 mmHg. Respiratory Normal work of breathing on room air.DAKHARI, ZUVER ([id number]) [id number] Page 11 of 14 General Notes: 10/25/2021: The pressure wound on his right elbow is nearly closed, with just a tiny open slit remaining. There are a couple of superficial open sites on his legs, in different locations than last visit. His right forearm is very edematous and erythematous, concerning for cellulitis. Integumentary (Hair, Skin) Wound #23 status is Open. Original cause of wound was Pressure Injury. The date acquired was: 05/26/2020. The wound has been in treatment 73 weeks. The wound is located on the Right Elbow. The wound measures 0.3cm length x 0.3cm width x 0.1cm depth; 0.071cm^2 area and 0.007cm^3 volume. There is Fat Layer (Subcutaneous Tissue) exposed. There is no tunneling or undermining noted. There is a medium amount of serosanguineous drainage noted. The wound margin is well defined and not attached to the wound base. There is large (67-100%) red granulation within the wound bed. There is a small (1-33%) amount of necrotic tissue within the wound bed including Adherent Slough. The periwound skin appearance had no abnormalities noted for color. The periwound skin appearance exhibited: Scarring, Dry/Scaly. Periwound temperature was noted as No Abnormality. Wound #30 status is Open. Original cause of wound was Skin T ear/Laceration. The date acquired was: 08/30/2021. The wound has been in  treatment 8 weeks. The wound is located on the Left,Medial Lower Leg. The wound measures 3.1cm length x 1.5cm width x 0.1cm depth; 3.652cm^2 area and 0.365cm^3 volume. There is Fat Layer (Subcutaneous Tissue) exposed. There is no tunneling or undermining noted. There is a small amount of serous drainage noted. The wound margin is distinct with the outline attached to the wound base. There is large (67-100%) red granulation within the wound bed. There is no necrotic tissue within the wound bed. The periwound skin appearance had no abnormalities noted for texture. The periwound skin appearance had no abnormalities noted for color. The periwound skin appearance exhibited: Dry/Scaly. Periwound temperature was noted as No Abnormality. Wound #31 status is Open. Original cause of wound was Skin T ear/Laceration. The date acquired was: 08/30/2021. The wound has been in treatment 8 weeks. The wound is located on the Right,Lateral Lower Leg. The wound measures 0.6cm length x 0.4cm width x 0.1cm depth; 0.188cm^2 area and 0.019cm^3 volume. There is Fat Layer (Subcutaneous Tissue) exposed. There is no tunneling or undermining noted. There is a small amount of serosanguineous drainage noted. The wound margin is distinct with the outline attached to the wound base. There is large (67-100%) red granulation within the wound bed. There is no necrotic tissue within the wound bed. The periwound skin appearance had no abnormalities noted for texture. The periwound skin appearance had no abnormalities noted for color. The periwound skin appearance exhibited: Dry/Scaly. Periwound temperature was noted as No Abnormality. 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 There was a Chemical/Enzymatic/Mechanical debridement performed by Resa Miner, RN. after achieving pain control using Lidocaine 4% Topical Solution. Other agent used was gauze and wound cleanser. A time out was conducted at 11:15, prior to the start of the procedure. There was no bleeding. The procedure was tolerated well with a pain level of 0 throughout and a pain level of 0 following the procedure. Post Debridement Measurements: 0.3cm length x 0.3cm width x 0.1cm depth; 0.007cm^3 volume. Post debridement Stage noted as Category/Stage IV. Character of Wound/Ulcer Post Debridement is improved. Post procedure Diagnosis Wound #23: Same as Pre-Procedure Plan Follow-up Appointments: Return Appointment in: - 2 months - Room 2 Bathing/ Shower/ Hygiene: May shower and wash wound with soap and water. - wash and gently scrub both legs prior to apply bandage Edema Control - Lymphedema / SCD / Other: Elevate legs to the level of the heart or above for 30 minutes daily and/or when sitting, a frequency of: - throughout the day Off-Loading: Turn and reposition every 2 hours The following medication(s) was prescribed: lidocaine topical 4 % cream cream topical was prescribed at facility amoxicillin-pot clavulanate oral 400 mg/5 mL-57 mg/5 mL suspension for reconstitution 10 mL p.o. twice daily x10 days starting 10/25/2021 WOUND #23: - Elbow Wound Laterality: Right Cleanser: Soap and Water 1 x Per Day/30 Days Discharge Instructions: May shower and wash wound with dial antibacterial soap and water prior to dressing change. Cleanser: Wound Cleanser 1 x Per Day/30 Days Discharge Instructions: Cleanse the wound with wound cleanser prior to applying a clean dressing using gauze sponges, not tissue or cotton balls. Prim Dressing: KerraCel Ag  Gelling Fiber Dressing, 2x2 in (silver alginate) 1 x Per Day/30 Days ary Discharge Instructions: Apply silver alginate to wound bed as instructed Secondary Dressing: ABD Pad, 5x9 1 x Per Day/30 Days Discharge Instructions: Apply over primary dressing as directed. Secured With: [employer], 4.5x3.1 (in/yd) 1 x Per Day/30 Days Discharge Instructions: Secure with Kerlix as directed. Secured With: 42M Medipore Public affairs consultant Surgical T 2x10 (in/yd) 1 x Per Day/30 Days ape Discharge Instructions: Secure with tape as directed. VERTIS, SCHEIB ([id number]) [id number] Page 12 of 14 WOUND #30: - Lower Leg Wound Laterality: Left, Medial Cleanser: Soap and Water 1 x Per Day/30 Days Discharge Instructions: May shower and wash wound with dial antibacterial soap and water prior to dressing change. Cleanser: Wound Cleanser 1 x Per Day/30 Days Discharge Instructions: Cleanse the wound with wound cleanser prior to applying a clean dressing using gauze sponges, not tissue or cotton balls. Peri-Wound Care: Triamcinolone 15 (g) 1 x Per Day/30 Days Discharge Instructions: Use triamcinolone 15 (g) as directed Peri-Wound Care: Sween Lotion (Moisturizing lotion) 1 x Per Day/30 Days Discharge Instructions: Apply moisturizing lotion as directed Prim Dressing: KerraCel Ag Gelling Fiber Dressing, 2x2 in (silver alginate) 1 x Per Day/30 Days ary Discharge Instructions: Apply silver alginate to wound bed as instructed Secondary Dressing: ABD Pad, 5x9 1 x Per Day/30 Days Discharge Instructions: Apply over primary dressing as directed. Secured With: [employer], 4.5x3.1 (in/yd) 1 x Per Day/30 Days Discharge Instructions: Secure with Kerlix as directed. Secured With: 42M [employer] Public affairs consultant Surgical T 2x10 (in/yd) 1 x Per Day/30 Days ape Discharge Instructions: Secure with tape as directed. WOUND #31: - Lower Leg Wound Laterality: Right, Lateral Cleanser: Soap and Water 1 x Per  Day/30 Days Discharge Instructions: May shower and wash wound with  dial antibacterial soap and water prior to dressing change. Cleanser: Wound Cleanser 1 x Per Day/30 Days Discharge Instructions: Cleanse the wound with wound cleanser prior to applying a clean dressing using gauze sponges, not tissue or cotton balls. Peri-Wound Care: Triamcinolone 15 (g) 1 x Per Day/30 Days Discharge Instructions: Use triamcinolone 15 (g) as directed Peri-Wound Care: Sween Lotion (Moisturizing lotion) 1 x Per Day/30 Days Discharge Instructions: Apply moisturizing lotion as directed Prim Dressing: KerraCel Ag Gelling Fiber Dressing, 2x2 in (silver alginate) 1 x Per Day/30 Days ary Discharge Instructions: Apply silver alginate to wound bed as instructed Secondary Dressing: ABD Pad, 5x9 1 x Per Day/30 Days Discharge Instructions: Apply over primary dressing as directed. Secured With: [employer], 4.5x3.1 (in/yd) 1 x Per Day/30 Days Discharge Instructions: Secure with Kerlix as directed. Secured With: 63M [employer]ipore Public affairs consultant Surgical T 2x10 (in/yd) 1 x Per Day/30 Days ape Discharge Instructions: Secure with tape as directed. 10/25/2021: The pressure wound on his right elbow is nearly closed, with just a tiny open slit remaining. There are a couple of superficial open sites on his legs, in different locations than last visit. His right forearm is very edematous and erythematous, concerning for cellulitis. No debridement was necessary today. We will continue silver alginate to the open wounds. I have prescribed a course of amoxicillin/clavulanic acid for the cellulitis. If it does not improve, he may need a DVT scan. We will plan for follow-up in 2 months, as usual. Electronic Signature(s) Signed: 10/25/2021 5:03:04 PM By: Adline Peals Signed: 10/26/2021 7:38:59 AM By: Fredirick Maudlin MD FACS Previous Signature: 10/25/2021 11:33:51 AM Version By: Fredirick Maudlin MD FACS Entered By: Adline Peals on 10/25/2021 16:59:52 -------------------------------------------------------------------------------- HxROS Details Patient Name: Date of Service: [name] [name], CLIFFO RD [name]. 10/25/2021 10:45 A M Medical Record Number: [id number] Patient Account Number: [id number] Date of Birth/Sex: Treating RN: 1945/07/15 (76 y.o. M) Primary Care Provider: Theodora Blow, MIKE Other Clinician: Referring Provider: Treating Provider/Extender: Sharlyn Bologna, MIKE Weeks in Treatment: 199 Information Obtained From Patient Eyes Medical History: Negative for: Cataracts; Glaucoma; Optic Neuritis Ear/Nose/Mouth/Throat Medical History: Negative for: Chronic sinus problems/congestion; Middle ear problems Hematologic/Lymphatic ARRICK, DUTTON ([id number]) [id number] Page 13 of 14 Medical History: Positive for: Anemia - Iron deficiency Negative for: Hemophilia; Human Immunodeficiency Virus; Lymphedema; Sickle Cell Disease Respiratory Medical History: Negative for: Aspiration; Asthma; Chronic Obstructive Pulmonary Disease (COPD); Pneumothorax; Sleep Apnea; Tuberculosis Cardiovascular Medical History: Positive for: Hypertension Negative for: Angina; Arrhythmia; Congestive Heart Failure; Coronary Artery Disease; Deep Vein Thrombosis; Hypotension; Myocardial Infarction; Peripheral Arterial Disease; Peripheral Venous Disease; Phlebitis; Vasculitis Gastrointestinal Medical History: Negative for: Cirrhosis ; Colitis; Crohns; Hepatitis A; Hepatitis B; Hepatitis C Endocrine Medical History: Positive for: Type II Diabetes Negative for: Type I Diabetes Time with diabetes: unknown Treated with: Oral agents Blood sugar tested every day: Yes Tested : Genitourinary Medical History: Negative for: End Stage Renal Disease Immunological Medical History: Negative for: Lupus Erythematosus; Raynauds; Scleroderma Integumentary (Skin) Medical History: Negative for: History of  Burn Musculoskeletal Medical History: Negative for: Gout; Rheumatoid Arthritis; Osteoarthritis; Osteomyelitis Neurologic Medical History: Positive for: Paraplegia Negative for: Dementia; Neuropathy; Quadriplegia; Seizure Disorder Oncologic Medical History: Negative for: Received Chemotherapy; Received Radiation Psychiatric Medical History: Negative for: Anorexia/bulimia; Confinement Anxiety Immunizations Pneumococcal Vaccine: Received Pneumococcal Vaccination: Yes Received Pneumococcal Vaccination On or After 60th Birthday: Yes Implantable Devices No devices added Hospitalization / Surgery History Type of Hospitalization/Surgery ANNETTE, LIOTTA ([id number]) [id number] Page 14 of 14 Hand surgery Family and Social History Never smoker;  Marital Status - Separated; Alcohol Use: Never; Drug Use: No History; Caffeine Use: Never; Financial Concerns: No; Food, Clothing or Shelter Needs: No; Support System Lacking: No; Transportation Concerns: No Electronic Signature(s) Signed: 10/25/2021 12:06:00 PM By: Fredirick Maudlin MD FACS Entered By: Fredirick Maudlin on 10/25/2021 11:20:19 -------------------------------------------------------------------------------- SuperBill Details Patient Name: Date of Service: [name] [name], CLIFFO RD [name]. 10/25/2021 Medical Record Number: [id number] Patient Account Number: [id number] Date of Birth/Sex: Treating RN: 19-Feb-1945 (76 y.o. 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 S60.811A Abrasion of  right wrist, initial encounter L89.013 Pressure ulcer of right elbow, stage 3 Facility Procedures : CPT4 Code: [id number] Description: 12393 - WOUND CARE VISIT-LEV 3 EST PT Modifier: Quantity: 1 Physician Procedures : CPT4 Code Description Modifier [id number] - WC PHYS LEVEL 4 - EST PT ICD-10 Diagnosis Description L89.013 Pressure ulcer of right elbow, stage 3 L97.909 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

## 2021-12-18 ENCOUNTER — Encounter (HOSPITAL_BASED_OUTPATIENT_CLINIC_OR_DEPARTMENT_OTHER): Payer: Medicare Other | Attending: General Surgery | Admitting: General Surgery

## 2021-12-18 DIAGNOSIS — G8250 Quadriplegia, unspecified: Secondary | ICD-10-CM | POA: Diagnosis not present

## 2021-12-18 DIAGNOSIS — E1151 Type 2 diabetes mellitus with diabetic peripheral angiopathy without gangrene: Secondary | ICD-10-CM | POA: Insufficient documentation

## 2021-12-18 DIAGNOSIS — E11622 Type 2 diabetes mellitus with other skin ulcer: Secondary | ICD-10-CM | POA: Insufficient documentation

## 2021-12-18 DIAGNOSIS — I872 Venous insufficiency (chronic) (peripheral): Secondary | ICD-10-CM | POA: Insufficient documentation

## 2021-12-18 DIAGNOSIS — L89013 Pressure ulcer of right elbow, stage 3: Secondary | ICD-10-CM | POA: Insufficient documentation

## 2021-12-18 DIAGNOSIS — I10 Essential (primary) hypertension: Secondary | ICD-10-CM | POA: Diagnosis not present

## 2021-12-18 DIAGNOSIS — L03113 Cellulitis of right upper limb: Secondary | ICD-10-CM | POA: Diagnosis not present

## 2021-12-18 NOTE — Progress Notes (Signed)
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 legs. I did put in for some triamcinolone 1 pound I think with a repeat last time that is helped. He is not on oral steroids as far as I am aware. He was supposed to  follow-up with dermatology Dr. Lorain Childes earlier this month but I believe they missed the appointment 10/25 1 month follow-up. He has been using triamcinolone on the inflamed blistered skin on his anterior lower legs. The other deep wound we had to find was a pressure area on the right elbow which I ordered Hydrofera Blue to last time but he came in with silver alginate. He arrives in clinic with 2 new wounds to the area on the left posterior heel with stage II pressure ulcer. He also has an area at the base of his right first toe plantar aspect. He is not on systemic steroids. His appointment with Dr. Lorain Childes is not until December 12/13 his appointment with dermatology Dr. Lorain Childes is tomorrow. Hopefully I will be able to keep this. Areas we had previously declined on his right first met head and left heel have healed he still has an area on his right great toe, multiple blisters on the anterior bilateral lower legs and a pressure ulcer on his rright elbow 01/26/2021; Mr. Lightsey is now at [facility] after a stay at [facility] I have not looked at this discharge summary. He was seen by dermatology on 12/28/2020. He is felt to have "pustular" inflammation of the skin. 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 They applied liberal TCA twice daily The patient had a wound on his right great toe that is healed however he still has a sizable pressure ulcer on the right elbow 2/23; I follow this patient on a palliative basis although both lower legs look a lot better since he went to the facility. They're applying Xeroform daily. There are no open blisters however he  has denuded epithelium when removed still has open wounds. He also has a pressure ulcer over the olecranon of his right elbow. They've been using Hydrofera Blue here 05/10/2021: This is my first encounter with the patient, who was followed in the past by Dr. Dellia Nims. The elbow  wound has healed. 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 07/05/2021: The ulcer on the volar surface of his right wrist is essentially closed with just a small pinpoint area that is open. 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 When his dressing was removed today, a small skin tear was identified on his posterior right calf. He has accumulated quite a bit of dead skin in plaques on both of his legs. 08/30/2021: 43-monthfollow-up. The old wounds have all healed, but he has reopened the pressure wound on his right elbow. It is stage III but very clean. On his legs, he has a very superficial ulcer on his left medial leg and a similar-appearing lesion on his right lateral leg. His daughter has been performing his wound care. 10/25/2021: 212-monthollow-up. The pressure wound on his right elbow is nearly closed, with just a tiny open slit remaining. There are a couple of superficial DUWILL, HEINKEL ([id number] Page 10 of 14 open sites on his legs, in different locations than last visit. His right forearm is very edematous and erythematous, concerning for cellulitis. 12/18/2021: The right elbow wound is still open a tiny bit with a little slough on the surface. The cellulitis seen at his last visit has resolved. The left medial leg wound is closed. The right lateral leg wound is very superficial and clean. Patient History Information obtained from Patient. Social History Never smoker, Marital Status - Separated, Alcohol Use - Never, Drug Use - No History, Caffeine Use - Never. Medical History Eyes Denies history of Cataracts, Glaucoma, Optic Neuritis Ear/Nose/Mouth/Throat Denies history of Chronic sinus problems/congestion, Middle ear  problems Hematologic/Lymphatic Patient has history of Anemia - Iron deficiency Denies history of Hemophilia, Human Immunodeficiency Virus, Lymphedema, Sickle Cell Disease Respiratory Denies history of Aspiration, Asthma, Chronic Obstructive Pulmonary Disease (COPD), Pneumothorax, Sleep Apnea, Tuberculosis Cardiovascular Patient has history of Hypertension Denies history of Angina, Arrhythmia, Congestive Heart Failure, Coronary Artery Disease, Deep Vein Thrombosis, Hypotension, Myocardial Infarction, Peripheral Arterial Disease, Peripheral Venous Disease, Phlebitis, Vasculitis Gastrointestinal Denies history of Cirrhosis , Colitis, Crohnoos, Hepatitis A, Hepatitis B, Hepatitis C Endocrine Patient has history of Type II Diabetes Denies history of Type I Diabetes Genitourinary Denies history of End Stage Renal Disease Immunological Denies history of Lupus Erythematosus, Raynaudoos, Scleroderma Integumentary (Skin) Denies history of History of Burn Musculoskeletal Denies history of Gout, Rheumatoid Arthritis, Osteoarthritis, Osteomyelitis Neurologic Patient has history of Paraplegia Denies history of Dementia, Neuropathy, Quadriplegia, Seizure Disorder Oncologic Denies history of Received Chemotherapy, Received Radiation Psychiatric Denies history of Anorexia/bulimia, Confinement Anxiety Hospitalization/Surgery History - Hand surgery. Objective Constitutional He is hypertensive, but asymptomatic.. Marland Kitcheno acute distress. Vitals Time Taken: 10:20 AM, Height: 67 in, Weight: 130 lbs, BMI: 20.4, Temperature: 98.4 F, Pulse: 76 bpm, Respiratory Rate: 18 breaths/min, Blood Pressure: 161/84 mmHg. Respiratory Normal work of breathing on room air. General Notes: 12/18/2021: The right elbow wound is still open a tiny bit with a little slough on the surface. The cellulitis seen at his last visit  has resolved. The left medial leg wound is closed. The right lateral leg wound is very superficial  and clean. Integumentary (Hair, Skin) Wound #23 status is Open. Original cause of wound was Pressure Injury. The date acquired was: 05/26/2020. The wound has been in treatment 81 weeks. The wound is located on the Right Elbow. The wound measures 4cm length x 1cm width x 0.1cm depth; 3.142cm^2 area and 0.314cm^3 volume. There is Fat Layer (Subcutaneous Tissue) exposed. There is no tunneling or undermining noted. There is a medium amount of serosanguineous drainage noted. The wound margin is well defined and not attached to the wound base. There is medium (34-66%) red, pink granulation within the wound bed. There is a medium (34-66%) amount of necrotic tissue within the wound bed including Eschar and Adherent Slough. The periwound skin appearance had no abnormalities noted for color. The periwound skin appearance exhibited: Scarring, Dry/Scaly. Periwound temperature was noted as No Abnormality. General Notes: cluster of 3 wounds on right elbow Wound #30 status is Open. Original cause of wound was Skin T ear/Laceration. The date acquired was: 08/30/2021. The wound has been in treatment 15 weeks. The wound is located on the Left,Medial Lower Leg. The wound measures 0cm length x 0cm width x 0cm depth; 0cm^2 area and 0cm^3 volume. There is no tunneling or undermining noted. There is a none present amount of drainage noted. The wound margin is flat and intact. There is no granulation within the wound bed. There is no necrotic tissue within the wound bed. The periwound skin appearance had no abnormalities noted for texture. The periwound skin appearance had no abnormalities noted for color. The periwound skin appearance exhibited: Dry/Scaly. Periwound temperature was noted as No Abnormality. Wound #31 status is Open. Original cause of wound was Skin Tear/Laceration. The date acquired was: 08/30/2021. The wound has been in treatment 15 weeks. ADEMIDE, SCHABERG ([id number]) [id number]  Page 11 of 14 The wound is located on the Right,Lateral Lower Leg. The wound measures 2.2cm length x 1cm width x 0.1cm depth; 1.728cm^2 area and 0.173cm^3 volume. There is Fat Layer (Subcutaneous Tissue) exposed. There is no tunneling or undermining noted. There is a small amount of serosanguineous drainage noted. The wound margin is distinct with the outline attached to the wound base. There is large (67-100%) red granulation within the wound bed. There is a small (1-33%) amount of necrotic tissue within the wound bed including Eschar and Adherent Slough. The periwound skin appearance had no abnormalities noted for texture. The periwound skin appearance had no abnormalities noted for color. The periwound skin appearance exhibited: Dry/Scaly. Periwound temperature was noted as No Abnormality. 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 There was a Selective/Open Wound Non-Viable Tissue Debridement with a total area of 2 sq cm performed by Fredirick Maudlin, MD. With the following instrument(s): Curette to remove Non-Viable tissue/material. Material removed includes [facility] after achieving pain control using Lidocaine 4% Topical Solution. No specimens were taken. A time out was conducted at 10:44, prior to the start of the procedure. A Minimum amount of bleeding was controlled with Pressure. The procedure was tolerated well. Post Debridement Measurements: 4cm length x 1cm width  x 0.1cm depth; 0.314cm^3 volume. Post debridement Stage noted as Category/Stage IV. Character of  Wound/Ulcer Post Debridement is improved. Post procedure Diagnosis Wound #23: Same as Pre-Procedure General Notes: scribed for Dr. Celine Ahr by Adline Peals, RN. Plan Follow-up Appointments: Return Appointment in: - 2 months - Room 2 Anesthetic: (In clinic) Topical Lidocaine 4% applied to wound bed Bathing/ Shower/ Hygiene: May shower and wash wound with soap and water. - wash and gently scrub both legs prior to apply bandage Edema Control - Lymphedema / SCD / Other: Elevate legs to the level of the heart or above for 30 minutes daily and/or when sitting, a frequency of: - throughout the day Off-Loading: Turn and reposition every 2 hours The following medication(s) was prescribed: lidocaine topical 4 % cream cream topical was prescribed at facility WOUND #23: - Elbow Wound Laterality: Right Cleanser: Soap and Water 1 x Per Day/30 Days Discharge Instructions: May shower and wash wound with dial antibacterial soap and water prior to dressing change. Cleanser: Wound Cleanser 1 x Per Day/30 Days Discharge Instructions: Cleanse the wound with wound cleanser prior to applying a clean dressing using gauze sponges, not tissue or cotton balls. Prim Dressing: Sorbalgon AG Dressing 2x2 (in/in) 1 x Per Day/30 Days ary Discharge Instructions: Apply to wound bed as instructed Secondary Dressing: ABD Pad, 5x9 1 x Per Day/30 Days Discharge Instructions: Apply over primary dressing as directed. Secured With: [employer], 4.5x3.1 (in/yd) 1 x Per Day/30 Days Discharge Instructions: Secure with Kerlix as directed. Secured With: 59M Medipore Public affairs consultant Surgical T 2x10 (in/yd) 1 x Per Day/30 Days ape Discharge Instructions: Secure with tape as directed. WOUND #31: - Lower Leg Wound Laterality: Right, Lateral Cleanser: Soap and Water 1 x Per Day/30 Days Discharge Instructions: May shower and wash wound with dial antibacterial soap and water prior to dressing change. Cleanser: Wound Cleanser 1 x  Per Day/30 Days Discharge Instructions: Cleanse the wound with wound cleanser prior to applying a clean dressing using gauze sponges, not tissue or cotton balls. Peri-Wound Care: Triamcinolone 15 (g) 1 x Per Day/30 Days Discharge Instructions: Use triamcinolone 15 (g) as directed Peri-Wound Care: Sween Lotion (Moisturizing lotion) 1 x Per Day/30 Days Discharge Instructions: Apply moisturizing lotion as directed Prim Dressing: Sorbalgon AG Dressing 2x2 (in/in) 1 x Per Day/30 Days ary Discharge Instructions: Apply to wound bed as instructed Secondary Dressing: ABD Pad, 5x9 1 x Per Day/30 Days Discharge Instructions: Apply over primary dressing as directed. Secured With: [employer], 4.5x3.1 (in/yd) 1 x Per Day/30 Days Discharge Instructions: Secure with Kerlix as directed. Secured With: 59M [employer] Public affairs consultant Surgical T 2x10 (in/yd) 1 x Per Day/30 Days ape Discharge Instructions: Secure with tape as directed. MARCELLIUS, MONTAGNA ([id number]) [id number] Page 12 of 14 12/18/2021: The right elbow wound is still open a tiny bit with a little slough on the surface. The cellulitis seen at his last visit has resolved. The left medial leg wound is closed. The right lateral leg wound is very superficial and clean. The only wound that required debridement was the elbow. I used a curette to debride a small amount of slough from the wound. We will continue silver alginate to all sites. Follow-up again in 2 months. Electronic Signature(s) Signed: 12/18/2021 10:51:24 AM By: Fredirick Maudlin MD FACS Entered By: Fredirick Maudlin on 12/18/2021 10:51:24 -------------------------------------------------------------------------------- HxROS Details Patient Name: Date of Service: [name] [name], CLIFFO RD [name]. 12/18/2021 10:30 A M Medical Record Number: [id number] Patient Account Number: [id number] Date of Birth/Sex: Treating  RN: [birth year] (76 y.o. M) Primary Care Provider: Theodora Blow, MIKE  Other Clinician: Referring Provider: Treating Provider/Extender: Sharlyn Bologna, MIKE Weeks in Treatment: 206 Information Obtained From Patient Eyes Medical History: Negative for: Cataracts; Glaucoma; Optic Neuritis Ear/Nose/Mouth/Throat Medical History: Negative for: Chronic sinus problems/congestion; Middle ear problems Hematologic/Lymphatic Medical History: Positive for: Anemia - Iron deficiency Negative for: Hemophilia; Human Immunodeficiency Virus; Lymphedema; Sickle Cell Disease Respiratory Medical History: Negative for: Aspiration; Asthma; Chronic Obstructive Pulmonary Disease (COPD); Pneumothorax; Sleep Apnea; Tuberculosis Cardiovascular Medical History: Positive for: Hypertension Negative for: Angina; Arrhythmia; Congestive Heart Failure; Coronary Artery Disease; Deep Vein Thrombosis; Hypotension; Myocardial Infarction; Peripheral Arterial Disease; Peripheral Venous Disease; Phlebitis; Vasculitis Gastrointestinal Medical History: Negative for: Cirrhosis ; Colitis; Crohns; Hepatitis A; Hepatitis B; Hepatitis C Endocrine Medical History: Positive for: Type II Diabetes Negative for: Type I Diabetes Time with diabetes: unknown Treated with: Oral agents Blood sugar tested every day: Yes Tested MOHD., DERFLINGER [name] ([id number]) [id number] Page 13 of 14 Medical History: Negative for: End Stage Renal Disease Immunological Medical History: Negative for: Lupus Erythematosus; Raynauds; Scleroderma Integumentary (Skin) Medical History: Negative for: History of Burn Musculoskeletal Medical History: Negative for: Gout; Rheumatoid Arthritis; Osteoarthritis; Osteomyelitis Neurologic Medical History: Positive for: Paraplegia Negative for: Dementia; Neuropathy; Quadriplegia; Seizure Disorder Oncologic Medical History: Negative for: Received Chemotherapy; Received Radiation Psychiatric Medical History: Negative for:  Anorexia/bulimia; Confinement Anxiety Immunizations Pneumococcal Vaccine: Received Pneumococcal Vaccination: Yes Received Pneumococcal Vaccination On or After 60th Birthday: Yes Implantable Devices No devices added Hospitalization / Surgery History Type of Hospitalization/Surgery Hand surgery Family and Social History Never smoker; Marital Status - Separated; Alcohol Use: Never; Drug Use: No History; Caffeine Use: Never; Financial Concerns: No; Food, Clothing or Shelter Needs: No; Support System Lacking: No; Transportation Concerns: No Electronic Signature(s) Signed: 12/18/2021 12:18:16 PM By: Fredirick Maudlin MD FACS Entered By: Fredirick Maudlin on 12/18/2021 10:48:34 -------------------------------------------------------------------------------- SuperBill Details Patient Name: Date of Service: [name] [name], CLIFFO RD [name]. 12/18/2021 Medical Record Number: [id number] Patient Account Number: [id number] Date of Birth/Sex: Treating RN: Dec 15, 1945 (76 y.o. M) Primary Care Provider: Theodora Blow, MIKE Other Clinician: Referring Provider: Treating Provider/Extender: Sharlyn Bologna, MIKE Weeks in Treatment: [address] 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 S60.811A Abrasion of right wrist, initial encounter L89.013 Pressure ulcer of right elbow, stage 3 Facility Procedures : CPT4 Code: [id number] Description: 16384 - DEBRIDE WOUND 1ST 20 SQ CM OR < ICD-10 Diagnosis Description L89.013 Pressure ulcer of right elbow, stage 3 Modifier: Quantity: 1 Physician Procedures : CPT4 Code Description Modifier [id number] 32122 - WC PHYS LEVEL 3 - EST PT  25 ICD-10 Diagnosis Description L97.909 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

## 2021-12-20 NOTE — Progress Notes (Signed)
ADRIEL, KESSEN ([id number]) [id number] Page 1 of 10 Visit Report for 12/18/2021 Arrival Information Details Patient Name: Date of Service: [name] [name], [name] RD [name]. 12/18/2021 10:30 A M Medical Record Number: [id number] Patient Account Number: [id number] Date of Birth/Sex: Treating RN: [birth year] (76 y.o. Collene Gobble Primary Care : Theodora Blow, MIKE Other Clinician: Referring : Treating /Extender: Sharlyn Bologna, MIKE Weeks in Treatment: 206 Visit Information History Since Last Visit Added or deleted any medications: No Patient Arrived: Stretcher Any new allergies or adverse reactions: No Arrival Time: 10:18 Had a fall or experienced change in No Accompanied By: Paramedics activities of daily living that may affect Transfer Assistance: Manual risk of falls: Patient Identification Verified: Yes Signs or symptoms of abuse/neglect since last visito No Patient Requires Transmission-Based Precautions: No Hospitalized since last visit: No Patient Has Alerts: Yes Implantable device outside of the clinic excluding No Patient Alerts: [name] ABI non compressible cellular tissue based products placed in the center since last visit: Pain Present Now: Yes Electronic Signature(s) Signed: 12/19/2021 4:45:09 PM By: Dellie Catholic RN Entered By: Dellie Catholic on 12/18/2021 10:20:02 -------------------------------------------------------------------------------- Encounter Discharge Information Details Patient Name: Date of Service: [name] [name], [name] [name]. 12/18/2021 10:30 A M Medical Record Number: [id number] Patient Account Number: [id number] Date of Birth/Sex: Treating RN: 05-09-1945 (76 y.o. [name] Primary Care : Theodora Blow, MIKE Other Clinician: Referring : Treating /Extender: Sharlyn Bologna, MIKE Weeks in Treatment: 206 Encounter Discharge Information Items Post Procedure Vitals Discharge  Condition: Stable Temperature (F): 98.4 Ambulatory Status: Stretcher Pulse (bpm): 76 Discharge Destination: Home Respiratory Rate (breaths/min): 18 Transportation: Ambulance Blood Pressure (mmHg): 161/84 Accompanied By: self Schedule Follow-up Appointment: Yes Clinical Summary of Care: Patient Declined Electronic Signature(s) Signed: 12/18/2021 4:56:57 PM By: Adline Peals Entered By: Adline Peals on 12/18/2021 11:00:23 [place], Hussam [name] ([id number]) [id number] Page 2 of 10 -------------------------------------------------------------------------------- Lower Extremity Assessment Details Patient Name: Date of Service: [name] [name], [name] RD [name]. 12/18/2021 10:30 A M Medical Record Number: [id number] Patient Account Number: [id number] Date of Birth/Sex: Treating RN: 27-Dec-1945 (76 y.o. Collene Gobble Primary Care : Theodora Blow, MIKE Other Clinician: Referring : Treating /Extender: Sharlyn Bologna, MIKE Weeks in Treatment: 206 Edema Assessment Assessed: [Left: No] [Right: No] Edema: [Left: No] [Right: No] Calf Left: Right: Point of Measurement: From Medial Instep 36.6 cm 34 cm Ankle Left: Right: Point of Measurement: From Medial Instep 21.5 cm 19 cm Vascular Assessment Pulses: Dorsalis Pedis Palpable: [Left:Yes] [Right:Yes] Electronic Signature(s) Signed: 12/19/2021 4:45:09 PM By: Dellie Catholic RN Entered By: Dellie Catholic on 12/18/2021 10:27:23 -------------------------------------------------------------------------------- Multi Wound Chart Details Patient Name: Date of Service: [name] Schultze RD [name]. 12/18/2021 10:30 A M Medical Record Number: [id number] Patient Account Number: [id number] Date of Birth/Sex: Treating RN: 06/30/1945 (76 y.o. M) Primary Care : Theodora Blow, MIKE Other Clinician: Referring : Treating /Extender: Sharlyn Bologna, MIKE Weeks in Treatment: 206 Vital  Signs Height(in): 67 Pulse(bpm): 76 Weight(lbs): 130 Blood Pressure(mmHg): 161/84 Body Mass Index(BMI): 20.4 Temperature(F): 98.4 Respiratory Rate(breaths/min): 18 [23:Photos:] [[id number] Page 3 of 10] Right Elbow Left, Medial Lower Leg Right, Lateral Lower Leg Wound Location: Pressure Injury Skin T ear/Laceration Skin T ear/Laceration Wounding Event: Pressure Ulcer Skin T ear Skin T ear Primary Etiology: Anemia, Hypertension, Type II Anemia, Hypertension, Type II Anemia, Hypertension, Type II Comorbid History: Diabetes, Paraplegia Diabetes, Paraplegia Diabetes, Paraplegia 05/26/2020 08/30/2021 08/30/2021 Date Acquired: 81 15 15 Weeks of Treatment: Open Open Open Wound Status: No No No Wound Recurrence: 4x1x0.1 0x0x0 2.2x1x0.1  Measurements L x W x D (cm) 3.142 0 1.728 A (cm) : rea 0.314 0 0.173 Volume (cm) : 59.60% 100.00% -109.50% % Reduction in A rea: 96.00% 100.00% -111.00% % 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 Full Thickness Without Exposed Full Thickness Without Exposed Classification: Support Structures Support Structures Medium None Present Small Exudate A mount: Serosanguineous N/A Serosanguineous Exudate Type: red, brown N/A red, brown Exudate Color: Well defined, not attached Flat and Intact Distinct, outline attached Wound Margin: Medium (34-66%) None Present (0%) Large (67-100%) Granulation A mount: Red, Pink N/A Red Granulation Quality: Medium (34-66%) None Present (0%) Small (1-33%) Necrotic A mount: Eschar, Adherent Slough N/A Eschar, Adherent Slough Necrotic Tissue: Fat Layer (Subcutaneous Tissue): Yes Fascia: No Fat Layer (Subcutaneous Tissue): Yes Exposed Structures: Fascia: No Fat Layer (Subcutaneous Tissue): No Fascia: No Tendon: No Tendon: No Tendon: No Muscle: No Muscle: No Muscle: No Joint: No Joint: No Joint: No Bone: No Bone: No Bone: No Medium (34-66%) Large (67-100%) Medium  (34-66%) Epithelialization: Debridement - Selective/Open Wound N/A N/A Debridement: Pre-procedure Verification/Time Out 10:44 N/A N/A Taken: Lidocaine 4% Topical Solution N/A N/A Pain Control: Slough N/A N/A Tissue Debrided: Non-Viable Tissue N/A N/A Level: 2 N/A N/A Debridement A (sq cm): rea Curette N/A N/A Instrument: Minimum N/A N/A Bleeding: Pressure N/A N/A Hemostasis A chieved: Procedure was tolerated well N/A N/A Debridement Treatment Response: 4x1x0.1 N/A N/A Post Debridement Measurements L x W x D (cm) 0.314 N/A N/A Post Debridement Volume: (cm) Category/Stage IV N/A N/A Post Debridement Stage: Scarring: Yes No Abnormalities Noted No Abnormalities Noted Periwound Skin Texture: Dry/Scaly: Yes Dry/Scaly: Yes Dry/Scaly: Yes Periwound Skin Moisture: No Abnormalities Noted No Abnormalities Noted No Abnormalities Noted Periwound Skin Color: No Abnormality No Abnormality No Abnormality Temperature: cluster of 3 wounds on right elbow N/A N/A Assessment Notes: Debridement N/A N/A Procedures Performed: Treatment Notes Electronic Signature(s) Signed: 12/18/2021 10:46:36 AM By: Fredirick Maudlin MD FACS Entered By: Fredirick Maudlin on 12/18/2021 10:46:35 -------------------------------------------------------------------------------- Multi-Disciplinary Care Plan Details Patient Name: Date of Service: [name] Schultze RD [name]. 12/18/2021 10:30 A M Medical Record Number: [id number] Patient Account Number: [id number] Date of Birth/Sex: Treating RN: 10-28-1945 (76 y.o. [name] Primary Care : Theodora Blow, MIKE Other Clinician: Referring : Treating /Extender: Sharlyn Bologna, MIKE Weeks in Treatment: [place] reviewed with physician [address] [place], [place] [name] ([id number]) [id number] Page 4 of 10 Wound/Skin Impairment Nursing Diagnoses: Knowledge deficit related to  ulceration/compromised skin integrity Goals: Patient/caregiver will verbalize understanding of skin care regimen Date Initiated: 05/20/2019 Target Resolution Date: 02/16/2022 Goal Status: Active Ulcer/skin breakdown will have a volume reduction of 30% by week 4 Date Initiated: 12/31/2017 Date Inactivated: 05/20/2019 Target Resolution Date: 06/18/2018 Goal Status: Met Interventions: Assess patient/caregiver ability to obtain necessary supplies Assess patient/caregiver ability to perform ulcer/skin care regimen upon admission and as needed Assess ulceration(s) every visit Notes: Electronic Signature(s) Signed: 12/18/2021 4:56:57 PM By: Adline Peals Entered By: Adline Peals on 12/18/2021 10:59:31 -------------------------------------------------------------------------------- Pain Assessment Details Patient Name: Date of Service: [name] [name], [name] [name]. 12/18/2021 10:30 A M Medical Record Number: [id number] Patient Account Number: [id number] Date of Birth/Sex: Treating RN: [birth year] (76 y.o. Collene Gobble Primary Care : Theodora Blow, MIKE Other Clinician: Referring : Treating /Extender: Sharlyn Bologna, MIKE Weeks in Treatment: 206 Active Problems Location of Pain Severity and Description of Pain Patient Has Paino Yes Site Locations Pain Location: Generalized Pain With Dressing Change: No Duration of the Pain. Constant / Intermittento Constant Rate the pain.  Current Pain Level: 3 Worst Pain Level: 10 Least Pain Level: 1 Tolerable Pain Level: 3 Character of Pain Describe the Pain: Difficult to Pinpoint Pain Management and Medication Current Pain Management: Medication: Yes Cold Application: No Rest: Yes Massage: No Activity: No T.E.N.S.: No Heat Application: No Leg drop or elevation: No Is the Current Pain Management Adequate: Adequate [name] [name], [name] [name] ([id number]) [id number] Page 5 of 10 How does your  wound impact your activities of daily livingo Sleep: No Bathing: No Appetite: No Relationship With Others: No Bladder Continence: No Emotions: No Bowel Continence: No Work: No Toileting: No Drive: No Dressing: No Hobbies: No Electronic Signature(s) Signed: 12/19/2021 4:45:09 PM By: Dellie Catholic RN Entered By: Dellie Catholic on 12/18/2021 10:21:16 -------------------------------------------------------------------------------- Patient/Caregiver Education Details Patient Name: Date of Service: [name] Schultze RD [name]. 12/4/2023andnbsp10:30 A M Medical Record Number: [id number] Patient Account Number: [id number] Date of Birth/Gender: Treating RN: 02-18-1945 (76 y.o. [name] Primary Care Physician: Theodora Blow, MIKE Other Clinician: Referring Physician: Treating Physician/Extender: Sharlyn Bologna, MIKE Weeks in Treatment: 206 Education Assessment Education Provided To: Patient Education Topics Provided Wound/Skin Impairment: Methods: Explain/Verbal Responses: Reinforcements needed, State content correctly Electronic Signature(s) Signed: 12/18/2021 4:56:57 PM By: Adline Peals Entered By: Adline Peals on 12/18/2021 10:59:42 -------------------------------------------------------------------------------- Wound Assessment Details Patient Name: Date of Service: [name] [name], [name] [name]. 12/18/2021 10:30 A M Medical Record Number: [id number] Patient Account Number: [id number] Date of Birth/Sex: Treating RN: 04-Jan-1946 (76 y.o. Collene Gobble Primary Care Elnore Cosens: Theodora Blow, MIKE Other Clinician: Referring Tamya Denardo: Treating Eletha Culbertson/Extender: Sharlyn Bologna, MIKE Weeks in Treatment: 206 Wound Status Wound Number: 23 Primary Etiology: Pressure Ulcer Wound Location: Right Elbow Wound Status: Open Wounding Event: Pressure Injury Comorbid History: Anemia, Hypertension, Type II Diabetes, Paraplegia Date Acquired: 05/26/2020 Weeks Of  Treatment: 81 Clustered Wound: No Photos [name] [name], [name] [name] ([id number]) [id number] Page 6 of 10 Wound Measurements Length: (cm) 4 Width: (cm) 1 Depth: (cm) 0.1 Area: (cm) 3.142 Volume: (cm) 0.314 % Reduction in Area: 59.6% % Reduction in Volume: 96% Epithelialization: Medium (34-66%) Tunneling: No Undermining: No Wound Description Classification: Category/Stage IV Wound Margin: Well defined, not attached Exudate Amount: Medium Exudate Type: Serosanguineous Exudate Color: red, brown Foul Odor After Cleansing: No Slough/Fibrino No Wound Bed Granulation Amount: Medium (34-66%) Exposed Structure Granulation Quality: Red, Pink Fascia Exposed: No Necrotic Amount: Medium (34-66%) Fat Layer (Subcutaneous Tissue) Exposed: Yes Necrotic Quality: Eschar, Adherent Slough Tendon Exposed: No Muscle Exposed: No Joint Exposed: No Bone Exposed: No Periwound Skin Texture Texture Color No Abnormalities Noted: No No Abnormalities Noted: Yes Scarring: Yes Temperature / Pain Temperature: No Abnormality Moisture No Abnormalities Noted: No Dry / Scaly: Yes Assessment Notes cluster of 3 wounds on right elbow Treatment Notes Wound #23 (Elbow) Wound Laterality: Right Cleanser Soap and Water Discharge Instruction: May shower and wash wound with dial antibacterial soap and water prior to dressing change. Wound Cleanser Discharge Instruction: Cleanse the wound with wound cleanser prior to applying a clean dressing using gauze sponges, not tissue or cotton balls. Peri-Wound Care Topical Primary Dressing Sorbalgon AG Dressing 2x2 (in/in) Discharge Instruction: Apply to wound bed as instructed Secondary Dressing ABD Pad, 5x9 Discharge Instruction: Apply over primary dressing as directed. Secured With [employer], 4.5x3.1 (in/yd) Discharge Instruction: Secure with Kerlix as directed. 14M Medipore Soft Cloth Surgical T 2x10 (in/yd) ape Discharge  Instruction: Secure with tape as directed. [name] [name], [name] [name] ([id number]) [id number] Page 7 of 10 Compression Wrap Compression Stockings Add-Ons Electronic Signature(s) Signed: 12/19/2021 4:45:09 PM  By: Dellie Catholic RN Entered By: Dellie Catholic on 12/18/2021 10:33:28 -------------------------------------------------------------------------------- Wound Assessment Details Patient Name: Date of Service: [name] [name], [name] [name]. 12/18/2021 10:30 A M Medical Record Number: [id number] Patient Account Number: [id number] Date of Birth/Sex: Treating RN: Aug 23, 1945 (76 y.o. [name] Primary Care : Theodora Blow, MIKE Other Clinician: Referring : Treating /Extender: Sharlyn Bologna, MIKE Weeks in Treatment: 206 Wound Status Wound Number: 30 Primary Etiology: Skin Tear Wound Location: Left, Medial Lower Leg Wound Status: Open Wounding Event: Skin Tear/Laceration Comorbid History: Anemia, Hypertension, Type II Diabetes, Paraplegia Date Acquired: 08/30/2021 Weeks Of Treatment: 15 Clustered Wound: No Photos Wound Measurements Length: (cm) Width: (cm) Depth: (cm) Area: (cm) Volume: (cm) 0 % Reduction in Area: 100% 0 % Reduction in Volume: 100% 0 Epithelialization: Large (67-100%) 0 Tunneling: No 0 Undermining: No Wound Description Classification: Full Thickness Without Exposed Support Wound Margin: Flat and Intact Exudate Amount: None Present Structures Foul Odor After Cleansing: No Slough/Fibrino No Wound Bed Granulation Amount: None Present (0%) Exposed Structure Necrotic Amount: None Present (0%) Fascia Exposed: No Fat Layer (Subcutaneous Tissue) Exposed: No Tendon Exposed: No Muscle Exposed: No Joint Exposed: No Bone Exposed: No Periwound Skin Texture Texture Color No Abnormalities Noted: Yes No Abnormalities NotedGERASIMOS, [name] [name] ([id number]) [id number] Page 8 of  10 Moisture Temperature / Pain No Abnormalities Noted: No Temperature: No Abnormality Dry / Scaly: Yes Electronic Signature(s) Signed: 12/18/2021 4:56:57 PM By: Adline Peals Entered By: Adline Peals on 12/18/2021 10:43:49 -------------------------------------------------------------------------------- Wound Assessment Details Patient Name: Date of Service: [name] [name], [name] [name]. 12/18/2021 10:30 A M Medical Record Number: [id number] Patient Account Number: [id number] Date of Birth/Sex: Treating RN: 16-Jul-1945 (76 y.o. Collene Gobble Primary Care : Theodora Blow, MIKE Other Clinician: Referring : Treating /Extender: Sharlyn Bologna, MIKE Weeks in Treatment: 206 Wound Status Wound Number: 31 Primary Etiology: Skin Tear Wound Location: Right, Lateral Lower Leg Wound Status: Open Wounding Event: Skin Tear/Laceration Comorbid History: Anemia, Hypertension, Type II Diabetes, Paraplegia Date Acquired: 08/30/2021 Weeks Of Treatment: 15 Clustered Wound: No Photos Wound Measurements Length: (cm) 2.2 Width: (cm) 1 Depth: (cm) 0.1 Area: (cm) 1.728 Volume: (cm) 0.173 % Reduction in Area: -109.5% % Reduction in Volume: -111% Epithelialization: Medium (34-66%) Tunneling: No Undermining: No Wound Description Classification: Full Thickness Without Exposed Suppor Wound Margin: Distinct, outline attached Exudate Amount: Small Exudate Type: Serosanguineous Exudate Color: red, brown t Structures Foul Odor After Cleansing: No Slough/Fibrino No Wound Bed Granulation Amount: Large (67-100%) Exposed Structure Granulation Quality: Red Fascia Exposed: No Necrotic Amount: Small (1-33%) Fat Layer (Subcutaneous Tissue) Exposed: Yes Necrotic Quality: Eschar, Adherent Slough Tendon Exposed: No Muscle Exposed: No Joint Exposed: No Bone Exposed: No Periwound Skin Texture Texture Color No Abnormalities Noted: Yes No Abnormalities NotedTUNIS, [name] [name] ([id number]) [id number].pdf Page 9 of 10 Moisture Temperature / Pain No Abnormalities Noted: No Temperature: No Abnormality Dry / Scaly: Yes Treatment Notes Wound #31 (Lower Leg) Wound Laterality: Right, Lateral Cleanser Soap and Water Discharge Instruction: May shower and wash wound with dial antibacterial soap and water prior to dressing change. Wound Cleanser Discharge Instruction: Cleanse the wound with wound cleanser prior to applying a clean dressing using gauze sponges, not tissue or cotton balls. Peri-Wound Care Triamcinolone 15 (g) Discharge Instruction: Use triamcinolone 15 (g) as directed Sween Lotion (Moisturizing lotion) Discharge Instruction: Apply moisturizing lotion as directed Topical Primary Dressing Sorbalgon AG Dressing 2x2 (in/in) Discharge Instruction: Apply to wound bed as instructed Secondary Dressing ABD Pad, 5x9  Discharge Instruction: Apply over primary dressing as directed. Secured With [employer], 4.5x3.1 (in/yd) Discharge Instruction: Secure with Kerlix as directed. 61M Medipore Soft Cloth Surgical T 2x10 (in/yd) ape Discharge Instruction: Secure with tape as directed. Compression Wrap Compression Stockings Add-Ons Electronic Signature(s) Signed: 12/19/2021 4:45:09 PM By: Dellie Catholic RN Entered By: Dellie Catholic on 12/18/2021 10:37:38 -------------------------------------------------------------------------------- Vitals Details Patient Name: Date of Service: [name] [name], [name] [name]. 12/18/2021 10:30 A M Medical Record Number: [id number] Patient Account Number: [id number] Date of Birth/Sex: Treating RN: 05-06-1945 (76 y.o. Collene Gobble Primary Care : Theodora Blow, MIKE Other Clinician: Referring : Treating /Extender: Sharlyn Bologna, MIKE Weeks in Treatment: 206 Vital Signs Time Taken: 10:20 Temperature (F): 98.4 Height (in): 67 Pulse (bpm): 76 Weight (lbs):  130 Respiratory Rate (breaths/min): 18 Body Mass Index (BMI): 20.4 Blood Pressure (mmHg): 161/84 Reference Range: 80 - 120 mg / dl Electronic Signature(s) Signed: 12/19/2021 4:45:09 PM By: Dellie Catholic RN [name] [name], [name] [name] [name] ([id number]) PM By: Dellie Catholic RN [phone].pdf Page 10 of 10 Signed: 12/19/2021 4:45:09 Entered By: Dellie Catholic on 12/18/2021 10:20:34

## 2022-02-12 ENCOUNTER — Ambulatory Visit (HOSPITAL_BASED_OUTPATIENT_CLINIC_OR_DEPARTMENT_OTHER): Payer: Medicare Other | Admitting: General Surgery

## 2022-02-19 ENCOUNTER — Ambulatory Visit (HOSPITAL_BASED_OUTPATIENT_CLINIC_OR_DEPARTMENT_OTHER): Payer: Medicare Other | Admitting: General Surgery

## 2022-02-22 ENCOUNTER — Encounter (HOSPITAL_BASED_OUTPATIENT_CLINIC_OR_DEPARTMENT_OTHER): Payer: Medicare Other | Attending: General Surgery | Admitting: General Surgery

## 2022-02-22 DIAGNOSIS — Z7984 Long term (current) use of oral hypoglycemic drugs: Secondary | ICD-10-CM | POA: Insufficient documentation

## 2022-02-22 DIAGNOSIS — B351 Tinea unguium: Secondary | ICD-10-CM | POA: Insufficient documentation

## 2022-02-22 DIAGNOSIS — L03113 Cellulitis of right upper limb: Secondary | ICD-10-CM | POA: Insufficient documentation

## 2022-02-22 DIAGNOSIS — Z09 Encounter for follow-up examination after completed treatment for conditions other than malignant neoplasm: Secondary | ICD-10-CM | POA: Insufficient documentation

## 2022-02-22 DIAGNOSIS — L89013 Pressure ulcer of right elbow, stage 3: Secondary | ICD-10-CM | POA: Insufficient documentation

## 2022-02-22 DIAGNOSIS — E11621 Type 2 diabetes mellitus with foot ulcer: Secondary | ICD-10-CM | POA: Insufficient documentation

## 2022-02-22 DIAGNOSIS — G8250 Quadriplegia, unspecified: Secondary | ICD-10-CM | POA: Insufficient documentation

## 2022-02-22 DIAGNOSIS — Z8673 Personal history of transient ischemic attack (TIA), and cerebral infarction without residual deficits: Secondary | ICD-10-CM | POA: Insufficient documentation

## 2022-02-22 DIAGNOSIS — I10 Essential (primary) hypertension: Secondary | ICD-10-CM | POA: Diagnosis not present

## 2022-02-22 DIAGNOSIS — Z8616 Personal history of COVID-19: Secondary | ICD-10-CM | POA: Diagnosis not present

## 2022-02-22 NOTE — Progress Notes (Signed)
GURDEEP, KEESEY [name] ([id number]) [id number] Page 1 of 8 Visit Report for 02/22/2022 Arrival Information Details Patient Name: Date of Service: [name] [name], [name] [name]. 02/22/2022 10:30 A M Medical Record Number: [id number] Patient Account Number: [id number] Date of Birth/Sex: Treating RN: [birth year] (77 y.o. [name] Primary Care Kaulin Chaves: Theodora Blow, MIKE Other Clinician: Referring Eurika Sandy: Treating Angelique Chevalier/Extender: Sharlyn Bologna, MIKE Weeks in Treatment: 70 Visit Information History Since Last Visit Added or deleted any medications: No Patient Arrived: Stretcher Any new allergies or adverse reactions: No Arrival Time: 10:28 Had a fall or experienced change in No Accompanied By: self activities of daily living that may affect Transfer Assistance: None risk of falls: Patient Identification Verified: Yes Signs or symptoms of abuse/neglect since last visito No Secondary Verification Process Completed: Yes Hospitalized since last visit: No Patient Requires Transmission-Based Precautions: No Implantable device outside of the clinic excluding No Patient Has Alerts: Yes cellular tissue based products placed in the center Patient Alerts: [name] ABI non compressible since last visit: Has Dressing in Place as Prescribed: Yes Pain Present Now: Yes Electronic Signature(s) Signed: 02/22/2022 3:35:42 PM By: Adline Peals Entered By: Adline Peals on 02/22/2022 10:29:50 -------------------------------------------------------------------------------- Clinic Level of Care Assessment Details Patient Name: Date of Service: [name] [name], [name] [name]. 02/22/2022 10:30 A M Medical Record Number: [id number] Patient Account Number: [id number] Date of Birth/Sex: Treating RN: Jul 04, 1945 (77 y.o. [name] Primary Care Carel Carrier: Theodora Blow, MIKE Other Clinician: Referring Cherly Erno: Treating Ladarian Bonczek/Extender: Sharlyn Bologna, MIKE Weeks in  Treatment: 216 Clinic Level of Care Assessment Items TOOL 4 Quantity Score X- 1 0 Use when only an EandM is performed on FOLLOW-UP visit ASSESSMENTS - Nursing Assessment / Reassessment X- 1 10 Reassessment of Co-morbidities (includes updates in patient status) X- 1 5 Reassessment of Adherence to Treatment Plan ASSESSMENTS - Wound and Skin A ssessment / Reassessment X - Simple Wound Assessment / Reassessment - one wound 1 5 []  - 0 Complex Wound Assessment / Reassessment - multiple wounds []  - 0 Dermatologic / Skin Assessment (not related to wound area) ASSESSMENTS - Focused Assessment []  - 0 Circumferential Edema Measurements - multi extremities []  - 0 Nutritional Assessment / Counseling / Intervention [name] [name], [name] [name] ([id number]) [phone].pdf Page 2 of 8 []  - 0 Lower Extremity Assessment (monofilament, tuning fork, pulses) []  - 0 Peripheral Arterial Disease Assessment (using hand held doppler) ASSESSMENTS - Ostomy and/or Continence Assessment and Care []  - 0 Incontinence Assessment and Management []  - 0 Ostomy Care Assessment and Management (repouching, etc.) PROCESS - Coordination of Care X - Simple Patient / Family Education for ongoing care 1 15 []  - 0 Complex (extensive) Patient / Family Education for ongoing care X- 1 10 Staff obtains Programmer, systems, Records, T Results / Process Orders est []  - 0 Staff telephones HHA, Nursing Homes / Clarify orders / etc []  - 0 Routine Transfer to another Facility (non-emergent condition) []  - 0 Routine Hospital Admission (non-emergent condition) []  - 0 New Admissions / Biomedical engineer / Ordering NPWT Apligraf, etc. , []  - 0 Emergency Hospital Admission (emergent condition) X- 1 10 Simple Discharge Coordination []  - 0 Complex (extensive) Discharge Coordination PROCESS - Special Needs []  - 0 Pediatric / Minor Patient Management []  - 0 Isolation Patient Management []  - 0 Hearing / Language /  Visual special needs []  - 0 Assessment of Community assistance (transportation, D/C planning, etc.) []  - 0 Additional assistance / Altered mentation []  - 0 Support Surface(s) Assessment (bed, cushion, seat, etc.) INTERVENTIONS -  Wound Cleansing / Measurement []  - 0 Simple Wound Cleansing - one wound []  - 0 Complex Wound Cleansing - multiple wounds X- 1 5 Wound Imaging (photographs - any number of wounds) []  - 0 Wound Tracing (instead of photographs) []  - 0 Simple Wound Measurement - one wound []  - 0 Complex Wound Measurement - multiple wounds INTERVENTIONS - Wound Dressings []  - 0 Small Wound Dressing one or multiple wounds X- 1 15 Medium Wound Dressing one or multiple wounds []  - 0 Large Wound Dressing one or multiple wounds []  - 0 Application of Medications - topical []  - 0 Application of Medications - injection INTERVENTIONS - Miscellaneous []  - 0 External ear exam []  - 0 Specimen Collection (cultures, biopsies, blood, body fluids, etc.) []  - 0 Specimen(s) / Culture(s) sent or taken to Lab for analysis []  - 0 Patient Transfer (multiple staff / Civil Service fast streamer / Similar devices) []  - 0 Simple Staple / Suture removal (25 or less) []  - 0 Complex Staple / Suture removal (26 or more) []  - 0 Hypo / Hyperglycemic Management (close monitor of Blood Glucose) [name] [name], [name] [name] ([id number]) [id number] Page 3 of 8 []  - 0 Ankle / Brachial Index (ABI) - do not check if billed separately X- 1 5 Vital Signs Has the patient been seen at the hospital within the last three years: Yes Total Score: 80 Level Of Care: New/Established - Level 3 Electronic Signature(s) Signed: 02/22/2022 3:35:42 PM By: Adline Peals Entered By: Adline Peals on 02/22/2022 10:54:27 -------------------------------------------------------------------------------- Encounter Discharge Information Details Patient Name: Date of Service: [name] [name], [name] [name]. 02/22/2022 10:30 A  M Medical Record Number: [id number] Patient Account Number: [id number] Date of Birth/Sex: Treating RN: 13-Jan-1946 (77 y.o. [name] Primary Care Anamaria Dusenbury: Theodora Blow, MIKE Other Clinician: Referring Crysta Gulick: Treating Torrell Krutz/Extender: Sharlyn Bologna, MIKE Weeks in Treatment: 216 Encounter Discharge Information Items Discharge Condition: Stable Ambulatory Status: Stretcher Discharge Destination: Home Transportation: Ambulance Accompanied By: self Schedule Follow-up Appointment: No Clinical Summary of Care: Patient Declined Electronic Signature(s) Signed: 02/22/2022 3:35:42 PM By: Adline Peals Entered By: Adline Peals on 02/22/2022 10:55:00 -------------------------------------------------------------------------------- Lower Extremity Assessment Details Patient Name: Date of Service: [name] [name], [name] [name]. 02/22/2022 10:30 A M Medical Record Number: [id number] Patient Account Number: [id number] Date of Birth/Sex: Treating RN: 03/14/1945 (77 y.o. [name] Primary Care Mateya Torti: Theodora Blow, MIKE Other Clinician: Referring Shone Leventhal: Treating Houda Brau/Extender: Sharlyn Bologna, MIKE Weeks in Treatment: 216 Edema Assessment Assessed: [Left: No] [Right: No] Edema: [Left: No] [Right: No] Calf Left: Right: Point of Measurement: From Medial Instep 36.6 cm 34 cm Ankle Left: Right: Point of Measurement: From Medial Instep 21.5 cm 19 cm Electronic Signature(s) Signed: 02/22/2022 3:35:42 PM By: Hinda Lenis, Camarion [name] ([id number])IR ByCamelia Eng.pdf Page 4 of 8 Signed: 02/22/2022 3:35:42 aylor Entered By: Adline Peals on 02/22/2022 10:53:32 -------------------------------------------------------------------------------- Multi Wound Chart Details Patient Name: Date of Service: [name] [name], [name] [name]. 02/22/2022 10:30 A M Medical Record Number: [id number] Patient Account Number: [id number] Date  of Birth/Sex: Treating RN: September 27, 1945 (77 y.o. M) Primary Care Sharhonda Atwood: Theodora Blow, MIKE Other Clinician: Referring Peggy Loge: Treating Eino Whitner/Extender: Sharlyn Bologna, MIKE Weeks in Treatment: 216 Vital Signs Height(in): 67 Pulse(bpm): 88 Weight(lbs): 130 Blood Pressure(mmHg): 136/77 Body Mass Index(BMI): 20.4 Temperature(F): 97.6 Respiratory Rate(breaths/min): 16 [23:Photos:] [N/A:N/A] Right Elbow Right, Lateral Lower Leg N/A Wound Location: Pressure Injury Skin T ear/Laceration N/A Wounding Event: Pressure Ulcer Skin T ear N/A Primary Etiology: Anemia, Hypertension, Type II Anemia, Hypertension, Type  II N/A Comorbid History: Diabetes, Paraplegia Diabetes, Paraplegia 05/26/2020 08/30/2021 N/A Date Acquired: 91 25 N/A Weeks of Treatment: Open Open N/A Wound Status: No No N/A Wound Recurrence: 0x0x0 0x0x0 N/A Measurements L x W x D (cm) 0 0 N/A A (cm) : rea 0 0 N/A Volume (cm) : 100.00% 100.00% N/A % Reduction in Area: 100.00% 100.00% N/A % 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 Full Thickness Without Exposed N/A Classification: Support Structures None Present None Present N/A Exudate Amount: Well defined, not attached Distinct, outline attached N/A Wound Margin: None Present (0%) None Present (0%) N/A Granulation Amount: None Present (0%) None Present (0%) N/A Necrotic Amount: Fascia: No Fascia: No N/A Exposed Structures: Fat Layer (Subcutaneous Tissue): No Fat Layer (Subcutaneous Tissue): No Tendon: No Tendon: No Muscle: No Muscle: No Joint: No Joint: No Bone: No Bone: No Large (67-100%) Large (67-100%) N/A Epithelialization: Scarring: Yes No Abnormalities Noted N/A Periwound Skin Texture: Dry/Scaly: Yes Dry/Scaly: Yes N/A Periwound Skin Moisture: No Abnormalities Noted No Abnormalities Noted N/A Periwound Skin Color: No Abnormality No Abnormality N/A Temperature: Treatment Notes Electronic Signature(s) Signed: 02/22/2022  10:47:43 AM By: Fredirick Maudlin MD FACS Entered By: Fredirick Maudlin on 02/22/2022 10:47:43 Meidinger, Yisroel [name] ([id number]) [id number] Page 5 of 8 -------------------------------------------------------------------------------- Multi-Disciplinary Care Plan Details Patient Name: Date of Service: [name] [name], [name] [name]. 02/22/2022 10:30 A M Medical Record Number: [id number] Patient Account Number: [id number] Date of Birth/Sex: Treating RN: 12/16/1945 (77 y.o. [name] Primary Care Rosy Estabrook: Theodora Blow, MIKE Other Clinician: Referring Dontrez [name] [name]: Treating Manuel Dall/Extender: Sharlyn Bologna, MIKE Weeks in Treatment: [place] reviewed with physician Active Inactive Electronic Signature(s) Signed: 02/22/2022 3:35:42 PM By: Sabas Sous By: Adline Peals on 02/22/2022 10:53:46 -------------------------------------------------------------------------------- Pain Assessment Details Patient Name: Date of Service: [name] [name], [name] [name]. 02/22/2022 10:30 A M Medical Record Number: [id number] Patient Account Number: [id number] Date of Birth/Sex: Treating RN: 1946-01-06 (77 y.o. [name] Primary Care Jasir Rother: Theodora Blow, MIKE Other Clinician: Referring Leeanna Slaby: Treating Katelynne Revak/Extender: Sharlyn Bologna, MIKE Weeks in Treatment: 216 Active Problems Location of Pain Severity and Description of Pain Patient Has Paino Yes Site Locations Pain Location: Generalized Pain Rate the pain. Current Pain Level: 3 Pain Management and Medication Current Pain Management: Electronic Signature(s) Signed: 02/22/2022 3:35:42 PM By: Adline Peals Entered By: Adline Peals on 02/22/2022 10:30:42 [name] [name], [name] [name] ([id number]) [id number] Page 6 of 8 -------------------------------------------------------------------------------- Patient/Caregiver Education Details Patient Name: Date  of Service: [name] [name], [name] [name]. 2/8/2024andnbsp10:30 A M Medical Record Number: [id number] Patient Account Number: [id number] Date of Birth/Gender: Treating RN: [birth year] (77 y.o. [name] Primary Care Physician: Theodora Blow, MIKE Other Clinician: Referring Physician: Treating Physician/Extender: Sharlyn Bologna, MIKE Weeks in Treatment: 70 Education Assessment Education Provided To: Patient Education Topics Provided Wound/Skin Impairment: Methods: Explain/Verbal Responses: Reinforcements needed, State content correctly Electronic Signature(s) Signed: 02/22/2022 3:35:42 PM By: Adline Peals Entered By: Adline Peals on 02/22/2022 10:53:57 -------------------------------------------------------------------------------- Wound Assessment Details Patient Name: Date of Service: [name] [name], [name] [name]. 02/22/2022 10:30 A M Medical Record Number: [id number] Patient Account Number: [id number] Date of Birth/Sex: Treating RN: 1945/10/19 (77 y.o. [name] Primary Care Arali Somera: Theodora Blow, MIKE Other Clinician: Referring Romie Tay: Treating Isabela Nardelli/Extender: Sharlyn Bologna, MIKE Weeks in Treatment: 216 Wound Status Wound Number: [id number] Primary Etiology: Pressure Ulcer Wound Location: Right Elbow Wound Status: Open Wounding Event: Pressure Injury Comorbid History: Anemia, Hypertension, Type II Diabetes, Paraplegia Date Acquired: 05/26/2020 Weeks Of Treatment: 91 Clustered Wound: No Photos Wound  Measurements Length: (cm) Width: (cm) [name] [name], [name] [name] ([id number]) Depth: (cm) Area: (cm) Volume: (cm) 0 % Reduction in Area: 100% 0 % Reduction in Volume: 100% [id number].pdf Page 7 of 8 0 Epithelialization: Large (67-100%) 0 Tunneling: No 0 Undermining: No Wound Description Classification: Category/Stage IV Wound Margin: Well defined, not attached Exudate Amount: None Present Foul Odor After Cleansing:  No Slough/Fibrino No Wound Bed Granulation Amount: None Present (0%) Exposed Structure Necrotic Amount: None Present (0%) Fascia Exposed: No Fat Layer (Subcutaneous Tissue) Exposed: No Tendon Exposed: No Muscle Exposed: No Joint Exposed: No Bone Exposed: No Periwound Skin Texture Texture Color No Abnormalities Noted: No No Abnormalities Noted: Yes Scarring: Yes Temperature / Pain Temperature: No Abnormality Moisture No Abnormalities Noted: No Dry / Scaly: Yes Electronic Signature(s) Signed: 02/22/2022 3:35:42 PM By: Adline Peals Entered By: Adline Peals on 02/22/2022 10:38:45 -------------------------------------------------------------------------------- Wound Assessment Details Patient Name: Date of Service: [name] [name], [name] [name]. 02/22/2022 10:30 A M Medical Record Number: [id number] Patient Account Number: [id number] Date of Birth/Sex: Treating RN: [birth year] (77 y.o. [name] Primary Care Jaden Abreu: Theodora Blow, MIKE Other Clinician: Referring Londin Antone: Treating Sherrine Salberg/Extender: Sharlyn Bologna, MIKE Weeks in Treatment: 216 Wound Status Wound Number: [id number] Primary Etiology: Skin Tear Wound Location: Right, Lateral Lower Leg Wound Status: Open Wounding Event: Skin Tear/Laceration Comorbid History: Anemia, Hypertension, Type II Diabetes, Paraplegia Date Acquired: 08/30/2021 Weeks Of Treatment: 25 Clustered Wound: No Photos Wound Measurements Length: (cm) 0 Width: (cm) 0 Depth: (cm) 0 [name] [name], [name] [name] ([id number]) Area: (cm) 0 Volume: (cm) 0 % Reduction in Area: 100% % Reduction in Volume: 100% Epithelialization: Large (67-100%) [phone].pdf Page 8 of 8 Tunneling: No Undermining: No Wound Description Classification: Full Thickness Without Exposed Support Structures Wound Margin: Distinct, outline attached Exudate Amount: None Present Foul Odor After Cleansing: No Slough/Fibrino No Wound Bed Granulation  Amount: None Present (0%) Exposed Structure Necrotic Amount: None Present (0%) Fascia Exposed: No Fat Layer (Subcutaneous Tissue) Exposed: No Tendon Exposed: No Muscle Exposed: No Joint Exposed: No Bone Exposed: No Periwound Skin Texture Texture Color No Abnormalities Noted: Yes No Abnormalities Noted: Yes Moisture Temperature / Pain No Abnormalities Noted: No Temperature: No Abnormality Dry / Scaly: Yes Electronic Signature(s) Signed: 02/22/2022 3:35:42 PM By: Adline Peals Entered By: Adline Peals on 02/22/2022 10:37:54 -------------------------------------------------------------------------------- Vitals Details Patient Name: Date of Service: [name] [name], [name] [name]. 02/22/2022 10:30 A M Medical Record Number: [id number] Patient Account Number: [id number] Date of Birth/Sex: Treating RN: 1945-05-03 (77 y.o. [name] Primary Care Juergen Hardenbrook: Theodora Blow, MIKE Other Clinician: Referring Ivory Maduro: Treating Adrina Armijo/Extender: Sharlyn Bologna, MIKE Weeks in Treatment: 216 Vital Signs Time Taken: 10:31 Temperature (F): 97.6 Height (in): 67 Pulse (bpm): 92 Weight (lbs): 130 Respiratory Rate (breaths/min): 16 Body Mass Index (BMI): 20.4 Blood Pressure (mmHg): 136/77 Reference Range: 80 - 120 mg / dl Electronic Signature(s) Signed: 02/22/2022 3:35:42 PM By: Adline Peals Entered By: Adline Peals on 02/22/2022 10:32:05

## 2022-02-22 NOTE — Progress Notes (Signed)
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 Immunodeficiency Virus, Lymphedema, Sickle Cell Disease Respiratory Denies history of Aspiration, Asthma, Chronic Obstructive Pulmonary Disease (COPD), Pneumothorax, Sleep Apnea, Tuberculosis Cardiovascular Patient has history of Hypertension Denies history of Angina, Arrhythmia, Congestive Heart Failure, Coronary Artery Disease, Deep  Vein Thrombosis, Hypotension, Myocardial Infarction, Peripheral Arterial Disease, Peripheral Venous Disease, Phlebitis, Vasculitis Gastrointestinal Denies history of Cirrhosis , Colitis, Crohnoos, Hepatitis A, Hepatitis B, Hepatitis C Endocrine Patient has history of Type II Diabetes TOMA, ARTS R ([id number]) [id number] Page 9 of 12 Denies history of Type I Diabetes Genitourinary Denies history of End Stage Renal Disease Immunological Denies history of Lupus Erythematosus, Raynaudoos, Scleroderma Integumentary (Skin) Denies history of History of Burn Musculoskeletal Denies history of Gout, Rheumatoid Arthritis, Osteoarthritis, Osteomyelitis Neurologic Patient has history of Paraplegia Denies history of Dementia, Neuropathy, Quadriplegia, Seizure Disorder Oncologic Denies history of Received Chemotherapy, Received Radiation Psychiatric Denies history of Anorexia/bulimia, Confinement Anxiety Hospitalization/Surgery History - Hand surgery. Objective Constitutional no acute distress. Vitals Time Taken: 10:31 AM, Height: 67 in, Weight: 130 lbs, BMI: 20.4, Temperature: 97.6 F, Pulse: 92 bpm, Respiratory Rate: 16 breaths/min, Blood Pressure: 136/77 mmHg. Respiratory Normal work of breathing on room air. General Notes: 02/22/2022: All of his wounds are healed. Integumentary (Hair, Skin) Wound #23 status is Open. Original cause of wound was Pressure Injury. The date acquired was: 05/26/2020. The wound has been in treatment 91 weeks. The wound is located on the Right Elbow. The wound measures 0cm length x 0cm width x 0cm depth; 0cm^2 area and 0cm^3 volume. There is  no tunneling or undermining noted. There is a none present amount of drainage noted. The wound margin is well defined and not attached to the wound base. There is no granulation within the wound bed. There is no necrotic tissue within the wound bed. The periwound skin appearance had no  abnormalities noted for color. The periwound skin appearance exhibited: Scarring, Dry/Scaly. Periwound temperature was noted as No Abnormality. Wound #31 status is Open. Original cause of wound was Skin T ear/Laceration. The date acquired was: 08/30/2021. The wound has been in treatment 25 weeks. The wound is located on the Right,Lateral Lower Leg. The wound measures 0cm length x 0cm width x 0cm depth; 0cm^2 area and 0cm^3 volume. There is no tunneling or undermining noted. There is a none present amount of drainage noted. The wound margin is distinct with the outline attached to the wound base. There is no granulation within the wound bed. There is no necrotic tissue within the wound bed. The periwound skin appearance had no abnormalities noted for texture. The periwound skin appearance had no abnormalities noted for color. The periwound skin appearance exhibited: Dry/Scaly. Periwound temperature was noted as No Abnormality. 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 We will discharge him from the wound care center at this time. He may follow-up as needed. Electronic Signature(s) Signed: 02/22/2022 2:49:44 PM By: Fredirick Maudlin MD FACS Previous Signature: 02/22/2022 10:51:04 AM Version By: Fredirick Maudlin MD FACS Entered By: Fredirick Maudlin on 02/22/2022 14:49:44 -------------------------------------------------------------------------------- HxROS Details Patient Name: Date of Service: [name], [name] 02/22/2022 10:30 A M Medical Record Number: [id number] Patient Account Number: [id number] Date of Birth/Sex: Treating RN: 04/30/1945 (77 y.o. M) Primary Care Provider: Theodora Blow, MIKE Other Clinician: Referring Provider: Treating Provider/Extender: Sharlyn Bologna, MIKE Weeks in Treatment: 78 Information Obtained From Patient Eyes Medical History: Negative for: Cataracts; Glaucoma; Optic Neuritis Ear/Nose/Mouth/Throat Medical History: Negative for: Chronic sinus problems/congestion; Middle ear problems Hematologic/Lymphatic Medical History: Positive for: Anemia - Iron deficiency Negative for: Hemophilia; Human Immunodeficiency Virus; Lymphedema; Sickle Cell Disease Respiratory Medical History: Negative for: Aspiration; Asthma; Chronic Obstructive Pulmonary Disease (COPD); Pneumothorax; Sleep Apnea; Tuberculosis Cardiovascular Medical History: Positive for: Hypertension Negative for: Angina; Arrhythmia; Congestive Heart Failure; Coronary Artery Disease; Deep Vein Thrombosis; Hypotension; Myocardial Infarction; Peripheral Arterial Disease; Peripheral Venous Disease; Phlebitis; Vasculitis Gastrointestinal Medical History: Negative for: Cirrhosis ; Colitis; Crohns; Hepatitis A; Hepatitis B; Hepatitis C Endocrine Medical History: Positive for: Type II Diabetes Negative for: Type I Diabetes Time with diabetes: unknown Treated with: Oral agents Blood sugar tested every day: Yes Tested : Genitourinary Medical History: Negative for: End Stage Renal Disease Immunological ARLAND, USERY ([id number]) [phone].pdf Page 11 of 12 Medical History: Negative for: Lupus Erythematosus; Raynauds; Scleroderma Integumentary (Skin) Medical  History: Negative for: History of Burn Musculoskeletal Medical History: Negative for: Gout; Rheumatoid Arthritis; Osteoarthritis; Osteomyelitis Neurologic Medical History: Positive for: Paraplegia Negative for: Dementia; Neuropathy; Quadriplegia; Seizure Disorder Oncologic Medical History: Negative for: Received Chemotherapy;  Received Radiation Psychiatric Medical History: Negative for: Anorexia/bulimia; Confinement Anxiety Immunizations Pneumococcal Vaccine: Received Pneumococcal Vaccination: Yes Received Pneumococcal Vaccination On or After 60th Birthday: Yes Implantable Devices No devices added Hospitalization / Surgery History Type of Hospitalization/Surgery Hand surgery Family and Social History Never smoker; Marital Status - Separated; Alcohol Use: Never; Drug Use: No History; Caffeine Use: Never; Financial Concerns: No; Food, Clothing or Shelter Needs: No; Support System Lacking: No; Transportation Concerns: No Electronic Signature(s) Signed: 02/22/2022 10:55:37 AM By: Fredirick Maudlin MD FACS Entered By: Fredirick Maudlin on 02/22/2022 10:48:46 -------------------------------------------------------------------------------- SuperBill Details Patient Name: Date of Service: [name], [name] 02/22/2022 Medical Record Number: [id number] Patient Account Number: [id number] Date of Birth/Sex: Treating RN: 1945-05-07 (77 y.o. 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 WILBERTH, DAMON R ([id number]) [phone].pdf Page 12  of 12 [phone] Abrasion of right wrist, initial encounter L89.013 Pressure ulcer of right elbow, stage 3 Facility Procedures : CPT4 Code: [id number] Description: 99213 - WOUND CARE VISIT-LEV 3 EST PT Modifier: Quantity: 1 Physician Procedures : CPT4 Code Description Modifier [id number] - WC PHYS LEVEL 4 - EST PT ICD-10 Diagnosis Description S68.411D 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

## 2022-07-27 ENCOUNTER — Other Ambulatory Visit: Payer: Self-pay

## 2022-07-27 ENCOUNTER — Emergency Department (HOSPITAL_COMMUNITY)
Admission: EM | Admit: 2022-07-27 | Discharge: 2022-07-27 | Disposition: A | Discharge status: Home / Self Care | Payer: Medicare Other | Attending: Emergency Medicine | Admitting: Emergency Medicine

## 2022-07-27 DIAGNOSIS — E119 Type 2 diabetes mellitus without complications: Secondary | ICD-10-CM | POA: Diagnosis not present

## 2022-07-27 DIAGNOSIS — I10 Essential (primary) hypertension: Secondary | ICD-10-CM | POA: Insufficient documentation

## 2022-07-27 DIAGNOSIS — Z7984 Long term (current) use of oral hypoglycemic drugs: Secondary | ICD-10-CM | POA: Insufficient documentation

## 2022-07-27 DIAGNOSIS — R799 Abnormal finding of blood chemistry, unspecified: Secondary | ICD-10-CM | POA: Insufficient documentation

## 2022-07-27 DIAGNOSIS — Z7982 Long term (current) use of aspirin: Secondary | ICD-10-CM | POA: Diagnosis not present

## 2022-07-27 DIAGNOSIS — Z794 Long term (current) use of insulin: Secondary | ICD-10-CM | POA: Insufficient documentation

## 2022-07-27 DIAGNOSIS — R899 Unspecified abnormal finding in specimens from other organs, systems and tissues: Secondary | ICD-10-CM

## 2022-07-27 LAB — CBC WITH DIFFERENTIAL/PLATELET
Abs Immature Granulocytes: 0.02 10*3/uL (ref 0.00–0.07)
Basophils Absolute: 0 10*3/uL (ref 0.0–0.1)
Basophils Relative: 0 %
Eosinophils Absolute: 0.2 10*3/uL (ref 0.0–0.5)
Eosinophils Relative: 3 %
HCT: 30.9 % — ABNORMAL LOW (ref 39.0–52.0)
Hemoglobin: 10 g/dL — ABNORMAL LOW (ref 13.0–17.0)
Immature Granulocytes: 0 %
Lymphocytes Relative: 29 %
Lymphs Abs: 1.8 10*3/uL (ref 0.7–4.0)
MCH: 31.3 pg (ref 26.0–34.0)
MCHC: 32.4 g/dL (ref 30.0–36.0)
MCV: 96.6 fL (ref 80.0–100.0)
Monocytes Absolute: 0.7 10*3/uL (ref 0.1–1.0)
Monocytes Relative: 11 %
Neutro Abs: 3.5 10*3/uL (ref 1.7–7.7)
Neutrophils Relative %: 57 %
Platelets: 253 10*3/uL (ref 150–400)
RBC: 3.2 MIL/uL — ABNORMAL LOW (ref 4.22–5.81)
RDW: 13.9 % (ref 11.5–15.5)
WBC: 6.2 10*3/uL (ref 4.0–10.5)
nRBC: 0 % (ref 0.0–0.2)

## 2022-07-27 LAB — BASIC METABOLIC PANEL
Anion gap: 7 (ref 5–15)
BUN: 38 mg/dL — ABNORMAL HIGH (ref 8–23)
CO2: 18 mmol/L — ABNORMAL LOW (ref 22–32)
Calcium: 8.1 mg/dL — ABNORMAL LOW (ref 8.9–10.3)
Chloride: 107 mmol/L (ref 98–111)
Creatinine, Ser: 1.14 mg/dL (ref 0.61–1.24)
GFR, Estimated: >60 mL/min (ref >60)
Glucose, Bld: 255 mg/dL — ABNORMAL HIGH (ref 70–99)
Potassium: 4.7 mmol/L (ref 3.5–5.1)
Sodium: 132 mmol/L — ABNORMAL LOW (ref 135–145)

## 2022-07-27 MED ORDER — SODIUM CHLORIDE 0.9 % IV BOLUS: 500.0000 mL | Freq: Once | INTRAVENOUS | Status: DC

## 2022-07-27 NOTE — ED Provider Notes (Signed)
[place] EMERGENCY DEPARTMENT AT [facility] Provider Note   CSN: [id number] Arrival date & time: 07/27/22  1727     History  Chief Complaint  Patient presents with   Abnormal Lab    [name] [name] is a 77 y.o. adult.   Abnormal Lab   77 year old male presents emergency department with complaints of abnormal lab.  Patient had routine labs drawn by his primary care yesterday with elevation of potassium of 6.3.  Patient otherwise without complaint.  States that he is at his baseline.  Patient repeatedly asking if he can go home upon initial interview.  Denies chest pain, shortness of breath, fever, chills, night sweats, abdominal pain, nausea, vomiting, urinary symptoms, change in bowel habits.  Past medical history significant for diabetes mellitus, hypertension, hyperlipidemia, functional quadriplegic, anemia, TIA hyperkalemia, hypokalemia, urinary incontinence  Home Medications Prior to Admission medications   Medication Sig Start Date End Date Taking? Authorizing Provider  acetaminophen (TYLENOL) 500 MG tablet Take 1,000 mg by mouth in the morning and at bedtime.    [provider]  aspirin EC 81 MG EC tablet Take 1 tablet (81 mg total) by mouth daily. Swallow whole. 01/25/21   Leroy Sea, MD  B COMPLEX-BIOTIN-FA PO Take 1 tablet by mouth daily.    [provider]  Chlorhexidine Gluconate (HIBICLENS EX) Apply 1 application topically See admin instructions. Cleanse skin scabs and lesions every 72 hours    [provider]  famotidine (PEPCID) 20 MG tablet Take 20 mg by mouth daily.    [provider]  ferrous gluconate (FERGON) 324 MG tablet Take 324 mg by mouth in the morning and at bedtime.    [provider]  fludrocortisone (FLORINEF) 0.1 MG tablet Take 0.5 tablets (0.05 mg total) by mouth daily. 02/09/21   Regalado, Belkys A, MD  furosemide (LASIX) 20 MG tablet Take 1 tablet (20 mg total) by mouth 2 (two) times  daily. Patient taking differently: Take 10 mg by mouth See admin instructions. Give 1/2 tablet by mouth one time of day every three days for fluid mgmt 02/09/21   Regalado, Belkys A, MD  insulin lispro (HUMALOG) 100 UNIT/ML injection Inject 2-14 Units into the skin daily. Per sliding scale: if 200 units-250= 2 units, 251-300= 4 units, 301-350= 6 units, 351-400= 8 units, 401-450= 10 units, 451-500= 12 units. If glucometer reads High give 14 units    [provider]  loperamide (IMODIUM) 2 MG capsule Take 2 mg by mouth every 8 (eight) hours as needed for diarrhea or loose stools.    [provider]  magnesium oxide (MAG-OX) 400 MG tablet Take 400 mg by mouth daily.    [provider]  metFORMIN (GLUCOPHAGE-XR) 750 MG 24 hr tablet Take 750 mg by mouth 2 (two) times daily.    [provider]  Nutritional Supplements (RESOURCE 2.0) LIQD Take 120 mLs by mouth 3 (three) times daily.    [provider]  omeprazole (PRILOSEC OTC) 20 MG tablet Take 20 mg by mouth See admin instructions. Qd x 2 months for anemia    [provider]  OXYGEN Inhale 2 L into the lungs continuous.    [provider]  Pollen Extracts (PROSTAT PO) Take 30 mLs by mouth 2 (two) times daily. for wound healing    [provider]  pravastatin (PRAVACHOL) 20 MG tablet Take 20 mg by mouth daily. 12/20/19   [provider]  sodium bicarbonate 650 MG tablet Take  2 tablets (1,300 mg total) by mouth 3 (three) times daily. Patient taking differently: Take 650 mg by mouth 3 (three) times daily. 02/09/21   Regalado, Belkys A, MD  sodium zirconium cyclosilicate (LOKELMA) 5 g packet Take 5 g by mouth every other day.    [provider]      Allergies    Baclofen and Lisinopril    Review of Systems   Review of Systems  All other systems reviewed and are negative.   Physical Exam Updated Vital Signs BP 111/75   Pulse 90   Temp 99.7 F (37.6 C) (Oral)    Resp 16   Ht 5\' 7"  (1.702 m)   SpO2 100%   BMI 30.25 kg/m  Physical Exam Vitals and nursing note reviewed.  Constitutional:      General: She is not in acute distress.    Appearance: She is well-developed.  HENT:     Head: Normocephalic and atraumatic.  Eyes:     Conjunctiva/sclera: Conjunctivae normal.  Cardiovascular:     Rate and Rhythm: Normal rate and regular rhythm.     Heart sounds: No murmur heard. Pulmonary:     Effort: Pulmonary effort is normal. No respiratory distress.     Breath sounds: Normal breath sounds.  Abdominal:     Palpations: Abdomen is soft.     Tenderness: There is no abdominal tenderness.  Musculoskeletal:        General: No swelling.     Cervical back: Neck supple.     Comments: Right-sided hand amputation.  Skin:    General: Skin is warm and dry.     Capillary Refill: Capillary refill takes less than 2 seconds.     Comments: Patient with newly dressed lower extremities from weeping wounds yesterday.  Bandages not removed given that patient recently had wounds addressed yesterday.  Neurological:     Mental Status: She is alert.  Psychiatric:        Mood and Affect: Mood normal.     ED Results / Procedures / Treatments   Labs (all labs ordered are listed, but only abnormal results are displayed) Labs Reviewed  BASIC METABOLIC PANEL - Abnormal; Notable for the following components:      Result Value   Sodium 132 (*)    CO2 18 (*)    Glucose, Bld 255 (*)    BUN 38 (*)    Calcium 8.1 (*)    All other components within normal limits  CBC WITH DIFFERENTIAL/PLATELET - Abnormal; Notable for the following components:   RBC 3.20 (*)    Hemoglobin 10.0 (*)    HCT 30.9 (*)    All other components within normal limits    EKG EKG Interpretation Date/Time:  Friday July 27 2022 18:05:34 EDT Ventricular Rate:  89 PR Interval:  182 QRS Duration:  136 QT Interval:  378 QTC Calculation: 459 R Axis:   -16  Text Interpretation: Normal sinus  rhythm Right bundle branch block Non-specific ST-t changes Confirmed by Cathren Laine ([id number]) on 07/27/2022 7:40:21 PM  Radiology No results found.  Procedures Procedures    Medications Ordered in ED Medications  sodium chloride 0.9 % bolus 500 mL (500 mLs Intravenous Patient Refused/Not Given 07/27/22 1952)    ED Course/ Medical Decision Making/ A&P                             Medical Decision Making Amount and/or Complexity of Data  Reviewed Labs: ordered.   This patient presents to the ED for concern of abnormal lab, this involves an extensive number of treatment options, and is a complaint that carries with it a high risk of complications and morbidity.  The differential diagnosis includes hyperkalemia, hemolyzed sample   Co morbidities that complicate the patient evaluation  See HPI   Additional history obtained:  Additional history obtained from EMR External records from outside source obtained and reviewed including hospital records   Lab Tests:  I Ordered, and personally interpreted labs.  The pertinent results include: Mild hyponatremia of 132 as well as decrease in bicarb of 18 and hypocalcemia of 8.1 but otherwise, electrolytes within normal limits.  BUN elevated at 38 with normal creatinine and GFR.  No leukocytosis.  Evidence of anemia with hemoglobin of 10.  Platelets within normal range.   Imaging Studies ordered:  N/a   Cardiac Monitoring: / EKG:  The patient was maintained on a cardiac monitor.  I personally viewed and interpreted the cardiac monitored which showed an underlying rhythm of: Normal sinus rhythm, right bundle branch block, nonspecific ST changes.  Similar appearance prior to prior EKG performed.   Consultations Obtained:  N/a   Problem List / ED Course / Critical interventions / Medication management  Abnormal lab I ordered medication including 500 cc normal saline of which patient declined   I have reviewed the patients home  medicines and have made adjustments as needed   Social Determinants of Health:  Former cigarette use.  Denies illicit drug use.   Test / Admission - Considered:  Abnormal lab Vitals signs within normal range and stable throughout visit. Laboratory studies significant for: See above 77 year old male presents emergency department with complaints of abnormal lab.  Patient states he had routine laboratory studies drawn by his primary care yesterday and was told he had elevated potassium to come to the emergency department.  Patient states that he feels at his baseline.  Patient upon repeat laboratory studies without evidence of hyperkalemia with potassium within normal range.  Patient was with mild hyponatremia as well as decrease in bicarb; offered patient intravenous normal saline fluid supplementation given findings but patient repetitively declined.  He preferred to follow-up with his primary care in the outpatient setting while supplementing at home via oral fluids.  Will recommend close follow-up in the outpatient setting with primary care.  Treatment plan discussed at length with patient and he acknowledged understanding was agreeable to said plan.  Patient overall well-appearing, febrile in no acute distress. Worrisome signs and symptoms were discussed with the patient, and the patient acknowledged understanding to return to the ED if noticed. Patient was stable upon discharge.          Final Clinical Impression(s) / ED Diagnoses Final diagnoses:  Abnormal laboratory test result    Rx / DC Orders ED Discharge Orders     None         Peter Garter, [place] 07/27/22 2001    Cathren Laine, MD 07/30/22 [phone]

## 2022-07-27 NOTE — ED Provider Triage Note (Signed)
Emergency Medicine Provider Triage Evaluation Note  [name] [name] , a 77 y.o. adult  was evaluated in triage.  Pt send he by PCP for evaluation of potassium 6.3.  Denies having any symptoms at this point.  Denies fever, chest pain, shortness of breath, nausea, vomiting, bowel change, urine symptoms, rash.  Review of Systems  Positive: As above Negative: As above  Physical Exam  BP 111/75   Pulse 90   Temp 99.7 F (37.6 C) (Oral)   Resp 16   Ht 5\' 7"  (1.702 m)   SpO2 100%   BMI 30.25 kg/m  Gen:   Awake, no distress   Resp:  Normal effort  MSK:   Moves extremities without difficulty  Other:    Medical Decision Making  Medically screening exam initiated at 6:00 PM.  Appropriate orders placed.  [name] [name] was informed that the remainder of the evaluation will be completed by another provider, this initial triage assessment does not replace that evaluation, and the importance of remaining in the ED until their evaluation is complete.    [name] [name], [place] 07/27/22 [name] [name]

## 2022-07-27 NOTE — ED Notes (Signed)
Updated family on patient plan of care and status. PTAR also called and patient placed in line.

## 2022-07-27 NOTE — Discharge Instructions (Signed)
As discussed, your potassium was normal today while in the emergency department.  It was most likely a hemolyzed sample by your primary care or a bad sample.  Recommend follow-up with primary care for reassessment routinely.  Please do not hesitate to return to emergency department for worrisome signs and symptoms we discussed to become apparent.

## 2022-07-27 NOTE — ED Triage Notes (Signed)
Pt to ED via [facility] c/o abnormal. Pt notified by PCP that k+ was elevated. Unknown value, labs drawn yesterday. Orientation at baseline. Pt voicing no complaints. Pt is bedridden at baseline.   Last VS 122/80, hr 84, RR 18, 98%RA

## 2022-08-30 ENCOUNTER — Encounter (HOSPITAL_BASED_OUTPATIENT_CLINIC_OR_DEPARTMENT_OTHER): Payer: Medicare HMO | Attending: General Surgery | Admitting: General Surgery

## 2022-08-30 DIAGNOSIS — I10 Essential (primary) hypertension: Secondary | ICD-10-CM | POA: Insufficient documentation

## 2022-08-30 DIAGNOSIS — E1165 Type 2 diabetes mellitus with hyperglycemia: Secondary | ICD-10-CM | POA: Insufficient documentation

## 2022-08-30 DIAGNOSIS — E11621 Type 2 diabetes mellitus with foot ulcer: Secondary | ICD-10-CM | POA: Insufficient documentation

## 2022-08-30 DIAGNOSIS — L89513 Pressure ulcer of right ankle, stage 3: Secondary | ICD-10-CM | POA: Diagnosis not present

## 2022-08-30 DIAGNOSIS — Z7984 Long term (current) use of oral hypoglycemic drugs: Secondary | ICD-10-CM | POA: Insufficient documentation

## 2022-08-30 DIAGNOSIS — L89623 Pressure ulcer of left heel, stage 3: Secondary | ICD-10-CM | POA: Diagnosis present

## 2022-08-30 DIAGNOSIS — E11622 Type 2 diabetes mellitus with other skin ulcer: Secondary | ICD-10-CM | POA: Diagnosis not present

## 2022-08-30 DIAGNOSIS — G8250 Quadriplegia, unspecified: Secondary | ICD-10-CM | POA: Insufficient documentation

## 2022-08-30 DIAGNOSIS — L859 Epidermal thickening, unspecified: Secondary | ICD-10-CM | POA: Diagnosis not present

## 2022-08-30 NOTE — Progress Notes (Signed)
[name], BRUNSWICK [name] ([id number]) [id number] Page 1 of 4 Visit Report for 08/30/2022 Abuse Risk Screen Details Patient Name: Date of Service: [name] [name], [name] [name] RD [name]. 08/30/2022 12:45 PM Medical Record Number: [id number] Patient Account Number: [id number] Date of Birth/Sex: Treating RN: 01-25-1945 (77 y.o. Marlan Palau Primary Care : Roselyn Bering, MIKE Other Clinician: Referring : Treating /Extender: Marshell Garfinkel, MIKE Weeks in Treatment: 0 Abuse Risk Screen Items Answer ABUSE RISK SCREEN: Has anyone close to you tried to hurt or harm you recentlyo No Do you feel uncomfortable with anyone in your familyo No Has anyone forced you do things that you didnt want to doo No Electronic Signature(s) Signed: 08/30/2022 3:56:49 PM By: Samuella Bruin Entered By: Samuella Bruin on 08/30/2022 12:57:54 -------------------------------------------------------------------------------- Activities of Daily Living Details Patient Name: Date of Service: [name], PINHO RD [name]. 08/30/2022 12:45 PM Medical Record Number: [id number] Patient Account Number: [id number] Date of Birth/Sex: Treating RN: 1945/09/26 (77 y.o. Marlan Palau Primary Care : Roselyn Bering, MIKE Other Clinician: Referring : Treating /Extender: Marshell Garfinkel, MIKE Weeks in Treatment: 0 Activities of Daily Living Items Answer Activities of Daily Living (Please select one for each item) Drive Automobile Not Able T Medications ake Need Assistance Use T elephone Not Able Care for Appearance Not Able Use T oilet Not Able Bath / Shower Not Able Dress Self Not Able Feed Self Need Assistance Walk Not Able Get In / Out Bed Not Able Housework Not Able Prepare Meals Not Able Handle Money Not Able Shop for Self Not Able Electronic Signature(s) Signed: 08/30/2022 3:56:49 PM By: Samuella Bruin Entered By: Samuella Bruin on  08/30/2022 12:58:30 Streeper, Rivan [name] ([id number]) [id number] Page 2 of 4 -------------------------------------------------------------------------------- Education Screening Details Patient Name: Date of Service: AVIEL, GUTTER RD [name]. 08/30/2022 12:45 PM Medical Record Number: [id number] Patient Account Number: [id number] Date of Birth/Sex: Treating RN: Jul 21, 1945 (77 y.o. Marlan Palau Primary Care : Roselyn Bering, MIKE Other Clinician: Referring : Treating /Extender: Marshell Garfinkel, MIKE Weeks in Treatment: 0 Primary Learner Assessed: Patient Learning Preferences/Education Level/Primary Language Learning Preference: Explanation, Demonstration, Video, Printed Material Highest Education Level: High School Preferred Language: English Cognitive Barrier Language Barrier: No Translator Needed: No Memory Deficit: No Emotional Barrier: No Cultural/Religious Beliefs Affecting Medical Care: No Physical Barrier Impaired Vision: No Impaired Hearing: No Decreased Hand dexterity: Yes Knowledge/Comprehension Knowledge Level: Medium Comprehension Level: Medium Ability to understand written instructions: Medium Ability to understand verbal instructions: Medium Motivation Anxiety Level: Calm Cooperation: Cooperative Education Importance: Acknowledges Need Interest in Health Problems: Asks Questions Perception: Coherent Willingness to Engage in Self-Management Medium Activities: Readiness to Engage in Self-Management Medium Activities: Electronic Signature(s) Signed: 08/30/2022 3:56:49 PM By: Samuella Bruin Entered By: Samuella Bruin on 08/30/2022 12:59:07 -------------------------------------------------------------------------------- Fall Risk Assessment Details Patient Name: Date of Service: Shea [name], CLIFFO RD [name]. 08/30/2022 12:45 PM Medical Record Number: [id number] Patient Account Number: [id number] Date of  Birth/Sex: Treating RN: 04/21/1945 (77 y.o. Marlan Palau Primary Care : Roselyn Bering, MIKE Other Clinician: Referring : Treating /Extender: Marshell Garfinkel, MIKE Weeks in Treatment: 0 Fall Risk Assessment Items Have you had 2 or more falls in the last 12 monthso 0 No Telford, [place] [name] ([id number]) [phone] [id number] Page 3 of 4 Have you had any fall that resulted in injury in the last 12 monthso 0 No FALLS RISK SCREEN History of falling - immediate or within 3 months 0 No Secondary diagnosis (Do you have 2 or more medical diagnoseso) 0 No  Ambulatory aid None/bed rest/wheelchair/nurse 0 Yes Crutches/cane/walker 0 No Furniture 0 No Intravenous therapy Access/Saline/Heparin Lock 0 No Gait/Transferring Normal/ bed rest/ wheelchair 0 Yes Weak (short steps with or without shuffle, stooped but able to lift head while walking, may seek 0 No support from furniture) Impaired (short steps with shuffle, may have difficulty arising from chair, head down, impaired 0 No balance) Mental Status Oriented to own ability 0 No Electronic Signature(s) Signed: 08/30/2022 3:56:49 PM By: Samuella Bruin Entered By: Samuella Bruin on 08/30/2022 12:59:14 -------------------------------------------------------------------------------- Foot Assessment Details Patient Name: Date of Service: [name] RD [name]. 08/30/2022 12:45 PM Medical Record Number: [id number] Patient Account Number: [id number] Date of Birth/Sex: Treating RN: Sep 04, 1945 (77 y.o. Marlan Palau Primary Care : Roselyn Bering, MIKE Other Clinician: Referring : Treating /Extender: Marshell Garfinkel, MIKE Weeks in Treatment: 0 Foot Assessment Items [x]  Unable to perform due to altered mental status Site Locations + = Sensation present, - = Sensation absent, C = Callus, U = Ulcer [name] = Redness, W = Warmth, M = Maceration, PU = Pre-ulcerative  lesion F = Fissure, S = Swelling, D = Dryness Assessment Right: Left: Other Deformity: No No Prior Foot Ulcer: No No Prior Amputation: No No Charcot Joint: No No Ambulatory StatusHAYSE, GLENNEY [name] ([id number]) [phone] [id number] Page 4 of 4 Assistance Device: Ship broker) Signed: 08/30/2022 3:56:49 PM By: Samuella Bruin Entered By: Samuella Bruin on 08/30/2022 12:59:55 -------------------------------------------------------------------------------- Nutrition Risk Screening Details Patient Name: Date of Service: SEBASTIANO, VOLKOV RD [name]. 08/30/2022 12:45 PM Medical Record Number: [id number] Patient Account Number: [id number] Date of Birth/Sex: Treating RN: July 24, 1945 (77 y.o. Marlan Palau Primary Care : Roselyn Bering, MIKE Other Clinician: Referring : Treating /Extender: Marshell Garfinkel, MIKE Weeks in Treatment: 0 Height (in): 67 Weight (lbs): 130 Body Mass Index (BMI): 20.4 Nutrition Risk Screening Items Score Screening NUTRITION RISK SCREEN: I have an illness or condition that made me change the kind and/or amount of food I eat 0 No I eat fewer than two meals per day 0 No I eat few fruits and vegetables, or milk products 0 No I have three or more drinks of beer, liquor or wine almost every day 0 No I have tooth or mouth problems that make it hard for me to eat 0 No I don't always have enough money to buy the food I need 0 No I eat alone most of the time 0 No I take three or more different prescribed or over-the-counter drugs a day 1 Yes Without wanting to, I have lost or gained 10 pounds in the last six months 0 No I am not always physically able to shop, cook and/or feed myself 2 Yes Nutrition Protocols Good Risk Protocol Moderate Risk Protocol 0 Provide education on nutrition High Risk Proctocol Risk Level: Moderate Risk Score: 3 Electronic Signature(s) Signed:  08/30/2022 3:56:49 PM By: Samuella Bruin Entered By: Samuella Bruin on 08/30/2022 12:59:27

## 2022-08-30 NOTE — Progress Notes (Signed)
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 change the primary dressing 0. Since we last saw him he has not been back to [facility] to dermatology. We are using silver alginate to the right elbow. On his hand we are using Silvadene TCA gauze. 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 I was contacted by the attending hospitalist to look at pictures of his legs which looked about the same as what I remember. After he got out of the hospital I believe he was seen by Dr. Coralie Carpen I wait for his dermatology. Has understanding he says he supposed to be on colchicine doxycycline and fluconazole once weekly. Apparently he is getting this medication and communication with his daughter through the dermatology office. He arrives in our clinic. We looked at his amputation site on the right arm there is a small open area here. Sutures are still in. Our case manager communicated with the surgeon he has an appointment on September 7. She also talked to the patient's daughter he is no longer on colchicine, doxycycline ordered Diflucan but he is on the oral methylprednisolone as outlined by Dr. Coralie Carpen. 9/21; since the patient was last here his daughter I think he has been aggressively washing dried thick flaking skin from his lower legs. He arrives with things actually looking a lot better however there is still large areas of inflammation and especially on the right greater than left blistering these are tense. Nevertheless the skin in his legs looks a lot better.  He still has an area on the right olecranon that does not look quite as good as last time  He has been using silver alginate to the area on the elbow. I am not exactly sure what he has been using on the skin on his legs. I did put in for some triamcinolone 1 pound I think with a repeat last time that is helped. He is not on oral steroids as far as I am aware. He was supposed to follow-up with dermatology Dr. Coralie Carpen earlier this month but I believe they missed the appointment 10/25 1 month follow-up. He has been using triamcinolone on the inflamed blistered skin on his anterior lower legs. The other deep [name] we had to  find was a pressure area on the right elbow which I ordered Hydrofera Blue to last time but he came in with silver alginate. He arrives in clinic with 2 new wounds to the area on the left posterior heel with stage II pressure ulcer. He also has an area at the base of  his right first toe plantar aspect. He is not on systemic steroids. His appointment with Dr. Coralie Carpen is not until December 12/13 his appointment with dermatology Dr. Coralie Carpen is tomorrow. Hopefully I will be able to keep this. Areas we had previously declined on his right first met head and left heel have healed he still has an area on his right great toe, multiple blisters on the anterior bilateral lower legs and a pressure ulcer on his rright elbow 01/26/2021; Mr. Cosper is now at [facility] nursing home after a stay at [facility] I have not looked at this discharge summary. He was seen by dermatology on 12/28/2020. He is felt to have "pustular" inflammation of the skin. 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 They applied liberal TCA twice daily The patient had a [name] on his right great toe that is healed however he still has a sizable pressure ulcer on the right elbow 2/23; I follow this patient on a palliative basis although both lower legs look a lot better since he went to the facility. They're applying Xeroform daily. There are no open blisters however he has denuded epithelium when removed still has open wounds. He also has a pressure ulcer over the olecranon of his right elbow. They've been using Hydrofera Blue here 05/10/2021: This is my first encounter with the patient, who was followed in the past by Dr. Leanord Hawking. The elbow [name] has healed. 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 07/05/2021: The  ulcer on the volar surface of his right wrist is essentially closed with just a small pinpoint area that is open. 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 When his dressing was removed today, a small skin tear was identified on his posterior right calf. He has accumulated quite a bit of dead skin in plaques on both of his legs. 08/30/2021: 27-month follow-up. The old wounds have all healed, but he has reopened the pressure [name] on his right elbow. It is stage III but very clean. On his legs, he has a very superficial ulcer on his left medial leg and a similar-appearing lesion on his right lateral leg. His daughter has been performing his [name] care. 10/25/2021: 14-month follow-up. The pressure [name] on his right elbow is nearly closed, with just a tiny open slit remaining. There are a couple of superficial open sites on his legs, in different locations than last visit. His right forearm is very edematous and erythematous, concerning for cellulitis. 12/18/2021: The right elbow [name] is still open a tiny bit with a little slough on the surface. The cellulitis seen at his last visit has resolved. The left medial leg [name] is closed. The right lateral leg [name] is very superficial and clean. 02/22/2022: 73-month follow-up. All of his wounds are healed. 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 His daughter accompanies him to clinic today. She says that he has been having her take his Prevalon boots off because he does not like wearing them. His elbow has remained closed. He has not been taking his medications correctly and has developed a fair amount of lower extremity edema, as a result. She continues to apply triamcinolone to deal  with the epidermal thickening. Patient History Information obtained from Patient. Allergies lisinopril (Reaction: Hypersenitivity), baclofen Social  History Never smoker, Marital Status - Separated, Alcohol Use - Never, Drug Use - No History, Caffeine Use - Never. Medical History Eyes Denies history of Cataracts, Glaucoma, Optic Neuritis Ear/Nose/Mouth/Throat Denies history of Chronic sinus problems/congestion, Middle ear problems Hematologic/Lymphatic JAHRON, BIXLER ([id number]) [id number] Page 11 of 16 Patient has history of Anemia - Iron deficiency Denies history of Hemophilia, Human Immunodeficiency Virus, Lymphedema, Sickle Cell Disease Respiratory Denies history of Aspiration, Asthma, Chronic Obstructive Pulmonary Disease (COPD), Pneumothorax, Sleep Apnea, Tuberculosis Cardiovascular Patient has history of Hypertension Denies history of Angina, Arrhythmia, Congestive Heart Failure, Coronary Artery Disease, Deep Vein Thrombosis, Hypotension, Myocardial Infarction, Peripheral Arterial Disease, Peripheral Venous Disease, Phlebitis, Vasculitis Gastrointestinal Denies history of Cirrhosis , Colitis, Crohns, Hepatitis A, Hepatitis B, Hepatitis C Endocrine Patient has history of Type II Diabetes Denies history of Type I Diabetes Genitourinary Denies history of End Stage Renal Disease Immunological Denies history of Lupus Erythematosus, Raynauds, Scleroderma Integumentary (Skin) Denies history of History of Burn Musculoskeletal Denies history of Gout, Rheumatoid Arthritis, Osteoarthritis, Osteomyelitis Neurologic Patient has history of Dementia, Paraplegia Denies history of Neuropathy, Quadriplegia, Seizure Disorder Oncologic Denies history of Received Chemotherapy, Received Radiation Psychiatric Denies history of Anorexia/bulimia, Confinement Anxiety Hospitalization/Surgery History - Incision and drainage of [name] (Right). - Flexor tendon repair (Right). - Cervical spine surgery. - right hand amputation. Medical A Surgical History Notes nd Genitourinary Lower urinary tract infectious  disease Neurologic TIA Psychiatric depression Review of Systems (ROS) Constitutional Symptoms (General Health) Denies complaints or symptoms of Fatigue, Fever, Chills, Marked Weight Change. Eyes Denies complaints or symptoms of Dry Eyes, Vision Changes, Glasses / Contacts. Ear/Nose/Mouth/Throat Denies complaints or symptoms of Chronic sinus problems or rhinitis. Respiratory Denies complaints or symptoms of Chronic or frequent coughs, Shortness of Breath. Gastrointestinal Denies complaints or symptoms of Frequent diarrhea, Nausea, Vomiting. Integumentary (Skin) Complains or has symptoms of Wounds. Musculoskeletal Denies complaints or symptoms of Muscle Pain, Muscle Weakness. Psychiatric Denies complaints or symptoms of Claustrophobia. Objective Constitutional Hypertensive, asymptomatic. No acute distress. Vitals Time Taken: 1:12 PM, Temperature: 98.6 F, Pulse: 76 bpm, Respiratory Rate: 18 breaths/min, Blood Pressure: 165/94 mmHg. Respiratory Normal work of breathing on room air. General Notes: There is a small stage III pressure ulcer on the posteromedial aspect of his left calcaneus and a similar-looking ulcer on his right lateral leg, just adjacent to the ankle. Both have light slough accumulation. Integumentary (Hair, Skin) [name] [name] status is Open. Original cause of [name] was Pressure Injury. The date acquired was: 08/15/2022. The [name] is located on the Right,Lateral Lower Leg. The [name] measures 1.3cm length x 0.5cm width x 0.1cm depth; 0.511cm^2 area and 0.051cm^3 volume. There is Fat Layer (Subcutaneous Tissue) exposed. There is no tunneling or undermining noted. There is a medium amount of serosanguineous drainage noted. The [name] margin is distinct with the outline attached to the [name] base. There is large (67-100%) red granulation within the [name] bed. There is no necrotic tissue within the [name] bed. The periwound skin appearance had no abnormalities noted for texture.  The periwound skin appearance exhibited: Dry/Scaly, Rubor. Periwound temperature was noted as No Abnormality. [name] #33 status is Open. Original cause of [name] was Pressure Injury. The date acquired was: 08/14/2022. The [name] is located on the Left Calcaneus. The [name] measures 1.5cm length x 1cm width x 0.1cm depth; 1.178cm^2 area and 0.118cm^3 volume. There is Fat Layer (Subcutaneous Tissue) exposed. There is no tunneling  or undermining noted. There is a medium amount of serosanguineous drainage noted. The [name] margin is distinct with the outline attached to ABRAN, BORSA ([id number]) [phone].pdf Page 12 of 16 the [name] base. There is large (67-100%) red granulation within the [name] bed. There is no necrotic tissue within the [name] bed. The periwound skin appearance had no abnormalities noted for texture. The periwound skin appearance exhibited: Dry/Scaly, Rubor. Periwound temperature was noted as No Abnormality. Assessment Active Problems ICD-10 Pressure ulcer of left heel, stage 3 Pressure ulcer of right ankle, stage 3 Type 2 diabetes mellitus with foot ulcer Epidermal thickening, unspecified Type 2 diabetes mellitus with other skin ulcer Paraplegia, unspecified Type 2 diabetes mellitus with hyperglycemia Procedures [name] [name] Pre-procedure diagnosis of [name] [name] is a Pressure Ulcer located on the Right,Lateral Lower Leg . There was a Selective/Open [name] Non-Viable Tissue Debridement with a total area of 0.51 sq cm performed by Duanne Guess, MD. With the following instrument(s): Curette to remove Non-Viable tissue/material. Material removed includes [facility] after achieving pain control using Lidocaine 5% topical ointment. No specimens were taken. A time out was conducted at 13:34, prior to the start of the procedure. A Minimum amount of bleeding was controlled with Pressure. The procedure was tolerated well. Post Debridement Measurements: 1.3cm length x  0.5cm width x 0.1cm depth; 0.051cm^3 volume. Post debridement Stage noted as Category/Stage II. Character of [name]/Ulcer Post Debridement is improved. Post procedure Diagnosis [name] [name]: Same as Pre-Procedure General Notes: scribed for Dr. Lady Gary by Samuella Bruin, RN. [name] #33 Pre-procedure diagnosis of [name] #33 is a Diabetic [name]/Ulcer of the Lower Extremity located on the Left Calcaneus .Severity of Tissue Pre Debridement is: Fat layer exposed. There was a Selective/Open [name] Non-Viable Tissue Debridement with a total area of 1.18 sq cm performed by Duanne Guess, MD. With the following instrument(s): Curette to remove Non-Viable tissue/material. Material removed includes [facility] after achieving pain control using Lidocaine 5% topical ointment. No specimens were taken. A time out was conducted at 13:34, prior to the start of the procedure. A Minimum amount of bleeding was controlled with Pressure. The procedure was tolerated well. Post Debridement Measurements: 1.5cm length x 1cm width x 0.1cm depth; 0.118cm^3 volume. Character of [name]/Ulcer Post Debridement is improved. Severity of Tissue Post Debridement is: Fat layer exposed. Post procedure Diagnosis [name] #33: Same as Pre-Procedure General Notes: scribed for Dr. Lady Gary by Samuella Bruin, RN. Plan Follow-up Appointments: Return appointment in 1 month. - Dr. Lady Gary - room 2 - STRETCHER Anesthetic: (In clinic) Topical Lidocaine 5% applied to [name] bed Bathing/ Shower/ Hygiene: May shower and wash [name] with soap and water. - wash and gently scrub both legs prior to apply bandage Off-Loading: Turn and reposition every 2 hours Prevalon Boot - both feet The following medication(s) was prescribed: lidocaine topical 5 % ointment ointment topical was prescribed at facility triamcinolone acetonide topical 0.1 % cream Apply to legs daily starting 08/30/2022 [name] [name]: - Lower Leg [name] Laterality: Right, Lateral Cleanser: Soap and  Water 1 x Per Day/30 Days Discharge Instructions: May shower and wash [name] with dial antibacterial soap and water prior to dressing change. Peri-[name] Care: Triamcinolone 15 (g) 1 x Per Day/30 Days Discharge Instructions: Use triamcinolone 15 (g) as directed Peri-[name] Care: Sween Lotion (Moisturizing lotion) 1 x Per Day/30 Days Discharge Instructions: Apply moisturizing lotion as directed Prim Dressing: Maxorb Extra Ag+ Alginate Dressing, 2x2 (in/in) 1 x Per Day/30 Days ary Discharge Instructions: Apply to [name] bed as instructed Secondary Dressing: Woven Gauze  Sponge, Non-Sterile 4x4 in 1 x Per Day/30 Days Discharge Instructions: Apply over primary dressing as directed. Secured With: [employer], 4.5x3.1 (in/yd) 1 x Per Day/30 Days Discharge Instructions: Secure with Kerlix as directed. Secured With: [employer] T ape, 2x10 (in/yd) 1 x Per Day/30 Days Discharge Instructions: Secure dressing with tape as directed. [name] #33: - Calcaneus [name] Laterality: Left Cleanser: Soap and Water 1 x Per Day/30 Days Discharge Instructions: May shower and wash [name] with dial antibacterial soap and water prior to dressing change. Peri-[name] Care: Triamcinolone 15 (g) 1 x Per Day/30 Days Discharge Instructions: Use triamcinolone 15 (g) as directed Peri-[name] Care: Sween Lotion (Moisturizing lotion) 1 x Per Day/30 Days Discharge Instructions: Apply moisturizing lotion as directed Prim Dressing: Maxorb Extra Ag+ Alginate Dressing, 2x2 (in/in) 1 x Per Day/30 Days ary Discharge Instructions: Apply to [name] bed as instructed HARDIN, DAR ([id number]) [id number] Page 13 of 16 Secondary Dressing: ALLEVYN Heel 4 1/2in x 5 1/2in / 10.5cm x 13.5cm 1 x Per Day/30 Days Discharge Instructions: Apply over primary dressing as directed. Secondary Dressing: Woven Gauze Sponge, Non-Sterile 4x4 in 1 x Per Day/30 Days Discharge Instructions: Apply over primary dressing as  directed. Secured With: [employer], 4.5x3.1 (in/yd) 1 x Per Day/30 Days Discharge Instructions: Secure with Kerlix as directed. Secured With: [employer] T ape, 2x10 (in/yd) 1 x Per Day/30 Days Discharge Instructions: Secure dressing with tape as directed. 08/30/2022: He returns to clinic today with 2 new pressure ulcers. There is a small stage III pressure ulcer on the posteromedial aspect of his left calcaneus and a similar-looking ulcer on his right lateral leg, just adjacent to the ankle. Both have light slough accumulation. I used a curette to debride slough from both wounds. Will apply silver alginate to these. He was reminded of the importance of wearing his Prevalon boots to prevent further tissue damage and also to help these heal. His elbow remains closed, but I also reminded him to avoid leaning on the elbow to avoid reopening the site. His daughter reported that she was nearly out of triamcinolone for his legs so I sent in a new prescription for this, as well. He will follow-up in 1 month. Electronic Signature(s) Signed: 08/30/2022 2:05:31 PM By: Duanne Guess MD FACS Entered By: Duanne Guess on 08/30/2022 14:05:31 -------------------------------------------------------------------------------- HxROS Details Patient Name: Date of Service: [name] [name], [name] RD [name]. 08/30/2022 12:45 PM Medical Record Number: [id number] Patient Account Number: [id number] Date of Birth/Sex: Treating RN: 04/20/1945 (77 y.o. [name] [name] Primary Care Provider: Roselyn Bering, MIKE Other Clinician: Referring Provider: Treating Provider/Extender: Marshell Garfinkel, MIKE Weeks in Treatment: 0 Information Obtained From Patient Constitutional Symptoms (General Health) Complaints and Symptoms: Negative for: Fatigue; Fever; Chills; Marked Weight Change Eyes Complaints and Symptoms: Negative for: Dry Eyes; Vision Changes; Glasses / Contacts Medical History: Negative for:  Cataracts; Glaucoma; Optic Neuritis Ear/Nose/Mouth/Throat Complaints and Symptoms: Negative for: Chronic sinus problems or rhinitis Medical History: Negative for: Chronic sinus problems/congestion; Middle ear problems Respiratory Complaints and Symptoms: Negative for: Chronic or frequent coughs; Shortness of Breath Medical History: Negative for: Aspiration; Asthma; Chronic Obstructive Pulmonary Disease (COPD); Pneumothorax; Sleep Apnea; Tuberculosis Gastrointestinal Complaints and Symptoms: Negative for: Frequent diarrhea; Nausea; Vomiting Medical HistoryRAJAH, WHITBY ([id number]) [phone].pdf Page 14 of 16 Negative for: Cirrhosis ; Colitis; Crohns; Hepatitis A; Hepatitis B; Hepatitis C Integumentary (Skin) Complaints and Symptoms: Positive for: Wounds Medical History: Negative for: History of Burn Musculoskeletal Complaints and Symptoms: Negative for: Muscle Pain; Muscle  Weakness Medical History: Negative for: Gout; Rheumatoid Arthritis; Osteoarthritis; Osteomyelitis Psychiatric Complaints and Symptoms: Negative for: Claustrophobia Medical History: Negative for: Anorexia/bulimia; Confinement Anxiety Past Medical History Notes: depression Hematologic/Lymphatic Medical History: Positive for: Anemia - Iron deficiency Negative for: Hemophilia; Human Immunodeficiency Virus; Lymphedema; Sickle Cell Disease Cardiovascular Medical History: Positive for: Hypertension Negative for: Angina; Arrhythmia; Congestive Heart Failure; Coronary Artery Disease; Deep Vein Thrombosis; Hypotension; Myocardial Infarction; Peripheral Arterial Disease; Peripheral Venous Disease; Phlebitis; Vasculitis Endocrine Medical History: Positive for: Type II Diabetes Negative for: Type I Diabetes Time with diabetes: unknown Treated with: Oral agents Blood sugar tested every day: Yes Tested : Genitourinary Medical History: Negative for: End Stage Renal Disease Past  Medical History Notes: Lower urinary tract infectious disease Immunological Medical History: Negative for: Lupus Erythematosus; Raynauds; Scleroderma Neurologic Medical History: Positive for: Dementia; Paraplegia Negative for: Neuropathy; Quadriplegia; Seizure Disorder Past Medical History Notes: TIA Oncologic Medical History: Negative for: Received Chemotherapy; Received Radiation Immunizations Pneumococcal Vaccine: Received Pneumococcal Vaccination: Yes Received Pneumococcal Vaccination On or After [address], GLAVE [name] ([id number]) [id number] Page 15 of 16 Implantable Devices No devices added Hospitalization / Surgery History Type of Hospitalization/Surgery Incision and drainage of [name] (Right) Flexor tendon repair (Right) Cervical spine surgery right hand amputation Family and Social History Never smoker; Marital Status - Separated; Alcohol Use: Never; Drug Use: No History; Caffeine Use: Never; Financial Concerns: No; Food, Clothing or Shelter Needs: No; Support System Lacking: No; Transportation Concerns: No Electronic Signature(s) Signed: 08/30/2022 2:18:15 PM By: Duanne Guess MD FACS Signed: 08/30/2022 3:56:49 PM By: Samuella Bruin Entered By: Samuella Bruin on 08/30/2022 13:12:51 -------------------------------------------------------------------------------- SuperBill Details Patient Name: Date of Service: [name] L[address] RD [name]. 08/30/2022 Medical Record Number: [id number] Patient Account Number: [id number] Date of Birth/Sex: Treating RN: [birth year] (77 y.o. [name] [name] Primary Care Provider: Roselyn Bering, MIKE Other Clinician: Referring Provider: Treating Provider/Extender: Marshell Garfinkel, MIKE Weeks in Treatment: 0 Diagnosis Coding ICD-10 Codes Code Description [phone] Pressure ulcer of left heel, stage 3 L89.513 Pressure ulcer of right ankle, stage 3 E11.621 Type 2 diabetes mellitus with foot  ulcer L85.9 Epidermal thickening, unspecified E11.622 Type 2 diabetes mellitus with other skin ulcer G82.20 Paraplegia, unspecified E11.65 Type 2 diabetes mellitus with hyperglycemia Facility Procedures : CPT4 Code: [id number] Description: 99213 - [name] CARE VISIT-LEV 3 EST PT Modifier: 25 Quantity: 1 : CPT4 Code: [id number] Description: 97597 - DEBRIDE [name] 1ST 20 SQ CM OR < ICD-10 Diagnosis Description L89.623 Pressure ulcer of left heel, stage 3 L89.513 Pressure ulcer of right ankle, stage 3 Modifier: Quantity: 1 Physician Procedures : CPT4 Code Description Modifier [id number] 99214 - WC PHYS LEVEL 4 - EST PT 25 ICD-10 Diagnosis Description L89.623 Pressure ulcer of left heel, stage 3 L89.513 Pressure ulcer of right ankle, stage 3 E11.621 Type 2 diabetes mellitus with foot ulcer L85.9  Epidermal thickening, unspecified Quantity: 1 : [id number] 97597 - WC PHYS DEBR WO ANESTH 20 SQ CM ICD-10 Diagnosis Description L89.623 Pressure ulcer of left heel, stage 3 Pamer, Denvil [name] ([id number]) [id number] L89.513 Pressure ulcer of right ankle, stage 3 Quantity: 1 df Page 16 of 16 Electronic Signature(s) Signed: 08/30/2022 2:05:49 PM By: Duanne Guess MD FACS Entered By: Duanne Guess on 08/30/2022 14:05:48

## 2022-08-30 NOTE — Progress Notes (Signed)
WINDSOR, FAZ ([id number]) [id number] Page 1 of 10 Visit Report for 08/30/2022 Allergy List Details Patient Name: Date of Service: EINER, FERKO RD R. 08/30/2022 12:45 PM Medical Record Number: [id number] Patient Account Number: [id number] Date of Birth/Sex: Treating RN: May 21, 1945 (77 y.o. Marlan Palau Primary Care : Roselyn Bering, MIKE Other Clinician: Referring : Treating /Extender: Marshell Garfinkel, MIKE Weeks in Treatment: 0 Allergies Active Allergies lisinopril Reaction: Hypersenitivity baclofen Allergy Notes Electronic Signature(s) Signed: 08/30/2022 3:56:49 PM By: Samuella Bruin Entered By: Samuella Bruin on 08/30/2022 13:12:45 -------------------------------------------------------------------------------- Arrival Information Details Patient Name: Date of Service: [name] RD R. 08/30/2022 12:45 PM Medical Record Number: [id number] Patient Account Number: [id number] Date of Birth/Sex: Treating RN: 04/29/1945 (77 y.o. Marlan Palau Primary Care : Roselyn Bering, MIKE Other Clinician: Referring : Treating /Extender: Marshell Garfinkel, MIKE Weeks in Treatment: 0 Visit Information Patient Arrived: Walker Arrival Time: 13:03 Accompanied By: daughter Transfer Assistance: None Patient Identification Verified: Yes Secondary Verification Process Completed: Yes History Since Last Visit Added or deleted any medications: No Any new allergies or adverse reactions: No Had a fall or experienced change in activities of daily living that may affect risk of falls: No Signs or symptoms of abuse/neglect since last visito No Hospitalized since last visit: No Implantable device outside of the clinic excluding cellular tissue based products placed in the center since last visit: No Pain Present Now: No Electronic Signature(s) Signed: 08/30/2022 3:56:49 PM By: Samuella Bruin Entered By: Samuella Bruin on 08/30/2022 13:12:01 Westling, Chaynce R ([id number]) [id number] Page 2 of 10 -------------------------------------------------------------------------------- Clinic Level of Care Assessment Details Patient Name: Date of Service: IRBIN, KURMAN RD R. 08/30/2022 12:45 PM Medical Record Number: [id number] Patient Account Number: [id number] Date of Birth/Sex: Treating RN: [birth year] (77 y.o. Marlan Palau Primary Care : Roselyn Bering, MIKE Other Clinician: Referring : Treating /Extender: Marshell Garfinkel, MIKE Weeks in Treatment: 0 Clinic Level of Care Assessment Items TOOL 1 Quantity Score X- 1 0 Use when EandM and Procedure is performed on INITIAL visit ASSESSMENTS - Nursing Assessment / Reassessment X- 1 20 General Physical Exam (combine w/ comprehensive assessment (listed just below) when performed on new pt. evals) X- 1 25 Comprehensive Assessment (HX, ROS, Risk Assessments, Wounds Hx, etc.) ASSESSMENTS - Wound and Skin Assessment / Reassessment []  - 0 Dermatologic / Skin Assessment (not related to wound area) ASSESSMENTS - Ostomy and/or Continence Assessment and Care []  - 0 Incontinence Assessment and Management []  - 0 Ostomy Care Assessment and Management (repouching, etc.) PROCESS - Coordination of Care X - Simple Patient / Family Education for ongoing care 1 15 []  - 0 Complex (extensive) Patient / Family Education for ongoing care X- 1 10 Staff obtains Chiropractor, Records, T Results / Process Orders est []  - 0 Staff telephones HHA, Nursing Homes / Clarify orders / etc []  - 0 Routine Transfer to another Facility (non-emergent condition) []  - 0 Routine Hospital Admission (non-emergent condition) X- 1 15 New Admissions / Manufacturing engineer / Ordering NPWT Apligraf, etc. , []  - 0 Emergency Hospital Admission (emergent condition) PROCESS - Special Needs []  -  0 Pediatric / Minor Patient Management []  - 0 Isolation Patient Management []  - 0 Hearing / Language / Visual special needs []  - 0 Assessment of Community assistance (transportation, D/C planning, etc.) []  - 0 Additional assistance / Altered mentation []  - 0 Support Surface(s) Assessment (bed, cushion, seat, etc.) INTERVENTIONS - Miscellaneous []  - 0 External ear exam []  - 0 Patient  Transfer (multiple staff / Nurse, adult / Similar devices) []  - 0 Simple Staple / Suture removal (25 or less) []  - 0 Complex Staple / Suture removal (26 or more) []  - 0 Hypo/Hyperglycemic Management (do not check if billed separately) []  - 0 Ankle / Brachial Index (ABI) - do not check if billed separately Has the patient been seen at the hospital within the last three years: Yes Total Score: 85 Level Of Care: New/Established - Level 3 Electronic Signature(s) Signed: 08/30/2022 3:56:49 PM By: Vira Agar, Saud R ([id number]) PM By: Jorge Ny.pdf Page 3 of 10 Signed: 08/30/2022 3:56:49 aylor Entered By: Samuella Bruin on 08/30/2022 13:55:03 -------------------------------------------------------------------------------- Encounter Discharge Information Details Patient Name: Date of Service: THURMOND, FAMIGLIETTI RD R. 08/30/2022 12:45 PM Medical Record Number: [id number] Patient Account Number: [id number] Date of Birth/Sex: Treating RN: 1945-09-27 (77 y.o. Marlan Palau Primary Care : Roselyn Bering, MIKE Other Clinician: Referring : Treating /Extender: Marshell Garfinkel, MIKE Weeks in Treatment: 0 Encounter Discharge Information Items Post Procedure Vitals Discharge Condition: Stable Temperature (F): 98.6 Ambulatory Status: Stretcher Pulse (bpm): 76 Discharge Destination: Home Respiratory Rate (breaths/min): 18 Transportation: Private Auto Blood Pressure (mmHg): 165/94 Accompanied By: daughter Schedule Follow-up  Appointment: Yes Clinical Summary of Care: Patient Declined Electronic Signature(s) Signed: 08/30/2022 3:56:49 PM By: Samuella Bruin Entered By: Samuella Bruin on 08/30/2022 13:56:07 -------------------------------------------------------------------------------- Lower Extremity Assessment Details Patient Name: Date of Service: KNUTE, MELA RD R. 08/30/2022 12:45 PM Medical Record Number: [id number] Patient Account Number: [id number] Date of Birth/Sex: Treating RN: [birth year] (77 y.o. Marlan Palau Primary Care : Roselyn Bering, MIKE Other Clinician: Referring : Treating /Extender: Marshell Garfinkel, MIKE Weeks in Treatment: 0 Edema Assessment Assessed: [Left: No] [Right: No] [Left: Edema] [Right: :] Calf Left: Right: Point of Measurement: From Medial Instep 42.5 cm 41 cm Ankle Left: Right: Point of Measurement: From Medial Instep 19 cm 19.5 cm Vascular Assessment Pulses: Dorsalis Pedis Palpable: [Left:No] [Right:No] Extremity colors, hair growth, and conditions: Extremity Color: [Left:Red] [Right:Red] Hair Growth on Extremity: [Left:No] [Right:No] Temperature of Extremity: [Left:Warm] [Right:Warm] Capillary Refill: [Left:< 3 seconds] [Right:< 3 seconds] Dependent Rubor: [Left:Yes] [Right:Yes] Munch, Jamarea R ([id number]) [Left:No] [Right:[id number] Page 4 of 10 No] Toe Nail Assessment Left: Right: Thick: Yes Yes Discolored: Yes Yes Deformed: No No Improper Length and Hygiene: No No Electronic Signature(s) Signed: 08/30/2022 3:56:49 PM By: Samuella Bruin Entered By: Samuella Bruin on 08/30/2022 13:17:11 -------------------------------------------------------------------------------- Multi Wound Chart Details Patient Name: Date of Service: [name] RD R. 08/30/2022 12:45 PM Medical Record Number: [id number] Patient Account Number: [id number] Date of Birth/Sex: Treating RN: [birth year] (77 y.o.  M) Primary Care : Roselyn Bering, MIKE Other Clinician: Referring : Treating /Extender: Marshell Garfinkel, MIKE Weeks in Treatment: 0 Vital Signs Height(in): Pulse(bpm): 76 Weight(lbs): Blood Pressure(mmHg): 165/94 Body Mass Index(BMI): Temperature(F): 98.6 Respiratory Rate(breaths/min): 18 [32:Photos:] [N/A:N/A] Right, Lateral Lower Leg Left Calcaneus N/A Wound Location: Pressure Injury Pressure Injury N/A Wounding Event: Pressure Ulcer Diabetic Wound/Ulcer of the Lower N/A Primary Etiology: Extremity Anemia, Hypertension, Type II Anemia, Hypertension, Type II N/A Comorbid History: Diabetes, Dementia, Paraplegia Diabetes, Dementia, Paraplegia 08/15/2022 08/14/2022 N/A Date Acquired: 0 0 N/A Weeks of Treatment: Open Open N/A Wound Status: No No N/A Wound Recurrence: 1.3x0.5x0.1 1.5x1x0.1 N/A Measurements L x W x D (cm) 0.511 1.178 N/A A (cm) : rea 0.051 0.118 N/A Volume (cm) : Category/Stage II Grade 1 N/A Classification: Medium Medium N/A Exudate A mount: Serosanguineous Serosanguineous N/A Exudate Type: red, brown red, brown  N/A Exudate Color: Distinct, outline attached Distinct, outline attached N/A Wound Margin: Large (67-100%) Large (67-100%) N/A Granulation A mount: Red Red N/A Granulation Quality: None Present (0%) None Present (0%) N/A Necrotic A mount: Fat Layer (Subcutaneous Tissue): Yes Fat Layer (Subcutaneous Tissue): Yes N/A Exposed Structures: Fascia: No Fascia: No Tendon: No Tendon: No Muscle: No Muscle: No Joint: No Joint: No Bone: No Bone: No Small (1-33%) None N/A Epithelialization: Debridement - Selective/Open Wound Debridement - Selective/Open Wound N/A Debridement: STAS, BLACKLEDGE R ([id number]) [id number] Page 5 of 10 13:34 13:34 N/A Pre-procedure Verification/Time Out Taken: Lidocaine 5% topical ointment Lidocaine 5% topical ointment N/A Pain Control: [facility]  N/A Tissue Debrided: Non-Viable Tissue Non-Viable Tissue N/A Level: 0.51 1.18 N/A Debridement A (sq cm): rea Curette Curette N/A Instrument: Minimum Minimum N/A Bleeding: Pressure Pressure N/A Hemostasis A chieved: Procedure was tolerated well Procedure was tolerated well N/A Debridement Treatment Response: 1.3x0.5x0.1 1.5x1x0.1 N/A Post Debridement Measurements L x W x D (cm) 0.051 0.118 N/A Post Debridement Volume: (cm) Category/Stage II N/A N/A Post Debridement Stage: No Abnormalities Noted No Abnormalities Noted N/A Periwound Skin Texture: Dry/Scaly: Yes Dry/Scaly: Yes N/A Periwound Skin Moisture: Rubor: Yes Rubor: Yes N/A Periwound Skin Color: No Abnormality No Abnormality N/A Temperature: Debridement Debridement N/A Procedures Performed: Treatment Notes Electronic Signature(s) Signed: 08/30/2022 1:48:48 PM By: Duanne Guess MD FACS Entered By: Duanne Guess on 08/30/2022 13:48:48 -------------------------------------------------------------------------------- Multi-Disciplinary Care Plan Details Patient Name: Date of Service: [name] RD R. 08/30/2022 12:45 PM Medical Record Number: [id number] Patient Account Number: [id number] Date of Birth/Sex: Treating RN: 09/23/1945 (77 y.o. Marlan Palau Primary Care : Roselyn Bering, MIKE Other Clinician: Referring : Treating /Extender: Marshell Garfinkel, MIKE Weeks in Treatment: 0 Active Inactive Pressure Nursing Diagnoses: Knowledge deficit related to causes and risk factors for pressure ulcer development Knowledge deficit related to management of pressures ulcers Potential for impaired tissue integrity related to pressure, friction, moisture, and shear Goals: Patient will remain free from development of additional pressure ulcers Date Initiated: 08/30/2022 Target Resolution Date: 10/19/2022 Goal Status: Active Patient/caregiver will verbalize understanding of pressure  ulcer management Date Initiated: 08/30/2022 Target Resolution Date: 10/19/2022 Goal Status: Active Interventions: Assess: immobility, friction, shearing, incontinence upon admission and as needed Assess potential for pressure ulcer upon admission and as needed Provide education on pressure ulcers Notes: Wound/Skin Impairment Nursing Diagnoses: Impaired tissue integrity Knowledge deficit related to ulceration/compromised skin integrity GoalsOLAYINKA, KUNZMAN ([id number]) [id number] Page 6 of 10 Patient/caregiver will verbalize understanding of skin care regimen Date Initiated: 08/30/2022 Target Resolution Date: 10/19/2022 Goal Status: Active Interventions: Assess patient/caregiver ability to obtain necessary supplies Assess patient/caregiver ability to perform ulcer/skin care regimen upon admission and as needed Assess ulceration(s) every visit Treatment Activities: Referred to DME  for dressing supplies : 08/30/2022 Skin care regimen initiated : 08/30/2022 Topical wound management initiated : 08/30/2022 Notes: Electronic Signature(s) Signed: 08/30/2022 3:56:49 PM By: Samuella Bruin Entered By: Samuella Bruin on 08/30/2022 13:54:15 -------------------------------------------------------------------------------- Pain Assessment Details Patient Name: Date of Service: [name] RD R. 08/30/2022 12:45 PM Medical Record Number: [id number] Patient Account Number: [id number] Date of Birth/Sex: Treating RN: [birth year] (77 y.o. Marlan Palau Primary Care : Roselyn Bering, MIKE Other Clinician: Referring : Treating /Extender: Marshell Garfinkel, MIKE Weeks in Treatment: 0 Active Problems Location of Pain Severity and Description of Pain Patient Has Paino No Site Locations Rate the pain. Current Pain Level: 0 Pain Management and Medication Current Pain Management: Electronic Signature(s) Signed: 08/30/2022  3:56:49 PM  By: Gelene Mink By: Samuella Bruin on 08/30/2022 13:00:04 [name] ([id number]) [id number] Page 7 of 10 -------------------------------------------------------------------------------- Patient/Caregiver Education Details Patient Name: Date of Service: ROSARIO, MIRKIN RD R. 8/15/2024andnbsp12:45 PM Medical Record Number: [id number] Patient Account Number: [id number] Date of Birth/Gender: Treating RN: 1945-12-21 (77 y.o. Marlan Palau Primary Care Physician: Roselyn Bering, MIKE Other Clinician: Referring Physician: Treating Physician/Extender: Marshell Garfinkel, MIKE Weeks in Treatment: 0 Education Assessment Education Provided To: Patient Education Topics Provided Medication Safety: Methods: Explain/Verbal Responses: Reinforcements needed, State content correctly Wound/Skin Impairment: Methods: Explain/Verbal Responses: Reinforcements needed, State content correctly Electronic Signature(s) Signed: 08/30/2022 3:56:49 PM By: Samuella Bruin Entered By: Samuella Bruin on 08/30/2022 13:54:36 -------------------------------------------------------------------------------- Wound Assessment Details Patient Name: Date of Service: [name], [name] 08/30/2022 12:45 PM Medical Record Number: [id number] Patient Account Number: [id number] Date of Birth/Sex: Treating RN: February 07, 1945 (77 y.o. Marlan Palau Primary Care : Roselyn Bering, MIKE Other Clinician: Referring : Treating /Extender: Marshell Garfinkel, MIKE Weeks in Treatment: 0 Wound Status Wound Number: [id number] Primary Etiology: Pressure Ulcer Wound Location: Right, Lateral Lower Leg Wound Status: Open Wounding Event: Pressure Injury Comorbid Anemia, Hypertension, Type II Diabetes, Dementia, History: Paraplegia Date Acquired: 08/15/2022 Weeks Of Treatment: 0 Clustered Wound: No Photos SREERAM, CORSINO R ([id number])  [id number] Page 8 of 10 Wound Measurements Length: (cm) 1.3 Width: (cm) 0.5 Depth: (cm) 0.1 Area: (cm) 0.511 Volume: (cm) 0.051 % Reduction in Area: % Reduction in Volume: Epithelialization: Small (1-33%) Tunneling: No Undermining: No Wound Description Classification: Category/Stage II Wound Margin: Distinct, outline attached Exudate Amount: Medium Exudate Type: Serosanguineous Exudate Color: red, brown Foul Odor After Cleansing: No Slough/Fibrino No Wound Bed Granulation Amount: Large (67-100%) Exposed Structure Granulation Quality: Red Fascia Exposed: No Necrotic Amount: None Present (0%) Fat Layer (Subcutaneous Tissue) Exposed: Yes Tendon Exposed: No Muscle Exposed: No Joint Exposed: No Bone Exposed: No Periwound Skin Texture Texture Color No Abnormalities Noted: Yes No Abnormalities Noted: No Rubor: Yes Moisture No Abnormalities Noted: No Temperature / Pain Dry / Scaly: Yes Temperature: No Abnormality Treatment Notes Wound #32 (Lower Leg) Wound Laterality: Right, Lateral Cleanser Soap and Water Discharge Instruction: May shower and wash wound with dial antibacterial soap and water prior to dressing change. Peri-Wound Care Triamcinolone 15 (g) Discharge Instruction: Use triamcinolone 15 (g) as directed Sween Lotion (Moisturizing lotion) Discharge Instruction: Apply moisturizing lotion as directed Topical Primary Dressing Maxorb Extra Ag+ Alginate Dressing, 2x2 (in/in) Discharge Instruction: Apply to wound bed as instructed Secondary Dressing Woven Gauze Sponge, Non-Sterile 4x4 in Discharge Instruction: Apply over primary dressing as directed. Secured With [employer], 4.5x3.1 (in/yd) Discharge Instruction: Secure with Kerlix as directed. Transpore Surgical Tape, 2x10 (in/yd) Discharge Instruction: Secure dressing with tape as directed. Compression Wrap Compression Stockings Add-Ons Electronic Signature(s) Signed:  08/30/2022 3:56:49 PM By: Samuella Bruin Entered By: Samuella Bruin on 08/30/2022 13:30:20 JASANI, HELMLY R ([id number]) [id number] Page 9 of 10 -------------------------------------------------------------------------------- Wound Assessment Details Patient Name: Date of Service: ANIS, KENDALL RD R. 08/30/2022 12:45 PM Medical Record Number: [id number] Patient Account Number: [id number] Date of Birth/Sex: Treating RN: 03/01/1945 (77 y.o. Marlan Palau Primary Care : Roselyn Bering, MIKE Other Clinician: Referring : Treating /Extender: Marshell Garfinkel, MIKE Weeks in Treatment: 0 Wound Status Wound Number: 33 Primary Etiology: Diabetic Wound/Ulcer of the Lower Extremity Wound Location: Left Calcaneus Wound Status: Open Wounding Event: Pressure Injury Comorbid Anemia, Hypertension, Type II Diabetes, Dementia, History: Paraplegia Date Acquired: 08/14/2022 Weeks Of Treatment: 0 Clustered Wound: No Photos  Wound Measurements Length: (cm) 1.5 Width: (cm) 1 Depth: (cm) 0.1 Area: (cm) 1.178 Volume: (cm) 0.118 % Reduction in Area: % Reduction in Volume: Epithelialization: None Tunneling: No Undermining: No Wound Description Classification: Grade 1 Wound Margin: Distinct, outline attached Exudate Amount: Medium Exudate Type: Serosanguineous Exudate Color: red, brown Foul Odor After Cleansing: No Slough/Fibrino No Wound Bed Granulation Amount: Large (67-100%) Exposed Structure Granulation Quality: Red Fascia Exposed: No Necrotic Amount: None Present (0%) Fat Layer (Subcutaneous Tissue) Exposed: Yes Tendon Exposed: No Muscle Exposed: No Joint Exposed: No Bone Exposed: No Periwound Skin Texture Texture Color No Abnormalities Noted: Yes No Abnormalities Noted: No Rubor: Yes Moisture No Abnormalities Noted: No Temperature / Pain Dry / Scaly: Yes Temperature: No Abnormality Treatment Notes Wound #33  (Calcaneus) Wound Laterality: Left [address] and [address] JERIAN, PURDUE R ([id number]) [id number] Page 10 of 10 Discharge Instruction: May shower and wash wound with dial antibacterial soap and water prior to dressing change. Peri-Wound Care Triamcinolone 15 (g) Discharge Instruction: Use triamcinolone 15 (g) as directed Sween Lotion (Moisturizing lotion) Discharge Instruction: Apply moisturizing lotion as directed Topical Primary Dressing Maxorb Extra Ag+ Alginate Dressing, 2x2 (in/in) Discharge Instruction: Apply to wound bed as instructed Secondary Dressing ALLEVYN Heel 4 1/2in x 5 1/2in / 10.5cm x 13.5cm Discharge Instruction: Apply over primary dressing as directed. Woven Gauze Sponge, Non-Sterile 4x4 in Discharge Instruction: Apply over primary dressing as directed. Secured With [employer], 4.5x3.1 (in/yd) Discharge Instruction: Secure with Kerlix as directed. Transpore Surgical Tape, 2x10 (in/yd) Discharge Instruction: Secure dressing with tape as directed. Compression Wrap Compression Stockings Add-Ons Electronic Signature(s) Signed: 08/30/2022 3:56:49 PM By: Samuella Bruin Entered By: Samuella Bruin on 08/30/2022 13:25:42 -------------------------------------------------------------------------------- Vitals Details Patient Name: Date of Service: [name], [name] 08/30/2022 12:45 PM Medical Record Number: [id number] Patient Account Number: [id number] Date of Birth/Sex: Treating RN: January 10, 1946 (77 y.o. Marlan Palau Primary Care : Roselyn Bering, MIKE Other Clinician: Referring : Treating /Extender: Marshell Garfinkel, MIKE Weeks in Treatment: 0 Vital Signs Time Taken: 13:12 Temperature (F): 98.6 Pulse (bpm): 76 Respiratory Rate (breaths/min): 18 Blood Pressure (mmHg): 165/94 Reference Range: 80 - 120 mg / dl Electronic Signature(s) Signed: 08/30/2022 3:56:49 PM By: Samuella Bruin Entered By: Samuella Bruin on 08/30/2022 13:12:23

## 2022-10-01 ENCOUNTER — Ambulatory Visit (HOSPITAL_BASED_OUTPATIENT_CLINIC_OR_DEPARTMENT_OTHER): Payer: Medicare HMO | Admitting: General Surgery

## 2022-10-08 ENCOUNTER — Encounter (HOSPITAL_BASED_OUTPATIENT_CLINIC_OR_DEPARTMENT_OTHER): Payer: Medicare HMO | Attending: General Surgery | Admitting: General Surgery

## 2022-10-08 DIAGNOSIS — E1151 Type 2 diabetes mellitus with diabetic peripheral angiopathy without gangrene: Secondary | ICD-10-CM | POA: Diagnosis not present

## 2022-10-08 DIAGNOSIS — L89623 Pressure ulcer of left heel, stage 3: Secondary | ICD-10-CM | POA: Insufficient documentation

## 2022-10-08 DIAGNOSIS — L89013 Pressure ulcer of right elbow, stage 3: Secondary | ICD-10-CM | POA: Diagnosis not present

## 2022-10-08 DIAGNOSIS — Z8616 Personal history of COVID-19: Secondary | ICD-10-CM | POA: Diagnosis not present

## 2022-10-08 DIAGNOSIS — I872 Venous insufficiency (chronic) (peripheral): Secondary | ICD-10-CM | POA: Diagnosis not present

## 2022-10-08 DIAGNOSIS — I10 Essential (primary) hypertension: Secondary | ICD-10-CM | POA: Diagnosis not present

## 2022-10-08 DIAGNOSIS — Z8673 Personal history of transient ischemic attack (TIA), and cerebral infarction without residual deficits: Secondary | ICD-10-CM | POA: Diagnosis not present

## 2022-10-08 DIAGNOSIS — D649 Anemia, unspecified: Secondary | ICD-10-CM | POA: Diagnosis not present

## 2022-10-08 DIAGNOSIS — L859 Epidermal thickening, unspecified: Secondary | ICD-10-CM | POA: Insufficient documentation

## 2022-10-08 DIAGNOSIS — L97512 Non-pressure chronic ulcer of other part of right foot with fat layer exposed: Secondary | ICD-10-CM | POA: Diagnosis not present

## 2022-10-08 DIAGNOSIS — E1165 Type 2 diabetes mellitus with hyperglycemia: Secondary | ICD-10-CM | POA: Diagnosis not present

## 2022-10-08 DIAGNOSIS — E11621 Type 2 diabetes mellitus with foot ulcer: Secondary | ICD-10-CM | POA: Insufficient documentation

## 2022-10-08 DIAGNOSIS — F0390 Unspecified dementia without behavioral disturbance: Secondary | ICD-10-CM | POA: Insufficient documentation

## 2022-10-08 DIAGNOSIS — L89513 Pressure ulcer of right ankle, stage 3: Secondary | ICD-10-CM | POA: Diagnosis not present

## 2022-10-08 DIAGNOSIS — E11622 Type 2 diabetes mellitus with other skin ulcer: Secondary | ICD-10-CM | POA: Insufficient documentation

## 2022-10-08 DIAGNOSIS — G8250 Quadriplegia, unspecified: Secondary | ICD-10-CM | POA: Diagnosis not present
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
## 2022-10-08 NOTE — Progress Notes (Signed)
and Medication Current Pain Management: Notes reports his feet are sore Electronic Signature(s) Signed: 10/08/2022 4:32:44 PM By: Zenaida Deed RN, BSN Entered By: Zenaida Deed on 10/08/2022 07:31:37 -------------------------------------------------------------------------------- Patient/Caregiver Education Details Patient Name: Date of Service: [name] RD R. 9/23/2024andnbsp10:30 A M Medical Record Number: [id number] Patient Account Number: [id number] Date of Birth/Gender: Treating RN: [birth year] (77 y.o. Damaris Schooner Primary Care Physician: Roselyn Bering, MIKE Other Clinician: Referring Physician: Treating Physician/Extender: Marshell Garfinkel, MIKE Weeks in Treatment: [address] [place] R ([id number]) [id number] Page 9 of 17 Education Assessment Education Provided To: Patient Education Topics Provided Pressure: Methods: Explain/Verbal Responses: Reinforcements needed, State content correctly Wound/Skin Impairment: Methods: Explain/Verbal Responses: Reinforcements needed, State content  correctly Electronic Signature(s) Signed: 10/08/2022 4:32:44 PM By: Zenaida Deed RN, BSN Entered By: Zenaida Deed on 10/08/2022 07:22:59 -------------------------------------------------------------------------------- Wound Assessment Details Patient Name: Date of Service: [name], [name] 10/08/2022 10:30 A M Medical Record Number: [id number] Patient Account Number: [id number] Date of Birth/Sex: Treating RN: 12-12-1945 (77 y.o. Damaris Schooner Primary Care Tyyne Cliett: Roselyn Bering, MIKE Other Clinician: Referring Isadora Delorey: Treating Vivaan Helseth/Extender: Marshell Garfinkel, MIKE Weeks in Treatment: 5 Wound Status Wound Number: 32 Primary Etiology: Pressure Ulcer Wound Location: Right, Lateral Lower Leg Wound Status: Open Wounding Event: Pressure Injury Comorbid Anemia, Hypertension, Type II Diabetes, Dementia, History: Paraplegia Date Acquired: 08/15/2022 Weeks Of Treatment: 5 Clustered Wound: No Photos Wound Measurements Length: (cm) 4.5 Width: (cm) 3 Depth: (cm) 0.1 Area: (cm) 10.603 Volume: (cm) 1.06 % Reduction in Area: -1975% % Reduction in Volume: -1978.4% Epithelialization: Medium (34-66%) Tunneling: No Undermining: No Wound Description Classification: Category/Stage II Wound Margin: Distinct, outline attached Exudate Amount: Medium Exudate Type: Serosanguineous Exudate Color: red, brown JERRET, BUTORAC R ([id number]) Wound Bed Granulation Amount: Large (67-100%) Granulation Quality: Red Necrotic Amount: None Present (0%) Foul Odor After Cleansing: No Slough/Fibrino No [id number] Page 10 of 17 Exposed Structure Fascia Exposed: No Fat Layer (Subcutaneous Tissue) Exposed: Yes Tendon Exposed: No Muscle Exposed: No Joint Exposed: No Bone Exposed: No Periwound Skin Texture Texture Color No Abnormalities Noted: Yes No Abnormalities Noted: No Hemosiderin Staining: Yes Moisture Rubor: No No Abnormalities Noted: No Dry /  Scaly: Yes Temperature / Pain Temperature: No Abnormality Treatment Notes Wound #32 (Lower Leg) Wound Laterality: Right, Lateral Cleanser Soap and Water Discharge Instruction: May shower and wash wound with dial antibacterial soap and water prior to dressing change. Peri-Wound Care Triamcinolone 15 (g) Discharge Instruction: Use triamcinolone 15 (g) as directed Sween Lotion (Moisturizing lotion) Discharge Instruction: Apply moisturizing lotion as directed Topical Primary Dressing Maxorb Extra Ag+ Alginate Dressing, 2x2 (in/in) Discharge Instruction: Apply to wound bed as instructed Secondary Dressing Woven Gauze Sponge, Non-Sterile 4x4 in Discharge Instruction: Apply over primary dressing as directed. Secured With [employer], 4.5x3.1 (in/yd) Discharge Instruction: Secure with Kerlix as directed. Transpore Surgical Tape, 2x10 (in/yd) Discharge Instruction: Secure dressing with tape as directed. Compression Wrap Compression Stockings Add-Ons Electronic Signature(s) Signed: 10/08/2022 1:38:46 PM By: Dayton Scrape Signed: 10/08/2022 4:32:44 PM By: Zenaida Deed RN, BSN Entered By: Dayton Scrape on 10/08/2022 07:46:10 -------------------------------------------------------------------------------- Wound Assessment Details Patient Name: Date of Service: [name], [name] 10/08/2022 10:30 A M Medical Record Number: [id number] Patient Account Number: [id number] Date of Birth/Sex: Treating RN: 19-Aug-1945 (77 y.o. Damaris Schooner Primary Care Mariela Rex: Roselyn Bering, MIKE Other Clinician: Referring Coy Vandoren: Treating Clevon Khader/Extender: Marshell Garfinkel, MIKE Weeks in Treatment: [address], [place] R ([id number]) [id number] Page 11 of 17 Wound Status Wound Number: 33 Primary Etiology:  Topical Primary Dressing Maxorb Extra Ag+ Alginate Dressing, 2x2 (in/in) Discharge Instruction: Apply to wound bed as instructed Secondary Dressing Woven Gauze Sponge, Non-Sterile 4x4 in Discharge Instruction: Apply over primary dressing as directed. Secured With [employer], 4.5x3.1 (in/yd) Discharge Instruction: Secure with Kerlix as directed. Transpore Surgical Tape, 2x10 (in/yd) Discharge Instruction: Secure dressing with tape as directed. Compression Wrap Compression Stockings Add-Ons Electronic Signature(s) Signed: 10/08/2022 1:38:46 PM By: Dayton Scrape Signed: 10/08/2022 4:32:44 PM By: Zenaida Deed RN, BSN Entered By: Dayton Scrape on 10/08/2022 07:50:05 -------------------------------------------------------------------------------- Wound Assessment Details Patient Name: Date of Service: [name], [name] 10/08/2022 10:30 A M Medical Record Number: [id number] Patient Account Number: [id number] Date of Birth/Sex: Treating RN: 04-25-1945 (77 y.o. Damaris Schooner Primary Care Gabriele Loveland: Roselyn Bering, MIKE Other Clinician: Referring Ilze Roselli: Treating Kaysea Raya/Extender: Marshell Garfinkel, MIKE Weeks in Treatment: 5 Wound Status Wound Number: 35 Primary Etiology: Pressure Ulcer Wound Location: Right Elbow Wound Status: Open CORDON, LARI R ([id number]) [id number] Page 14 of 17 Wounding Event: Pressure Injury Comorbid Anemia, Hypertension, Type II Diabetes, Dementia, History: Paraplegia Date  Acquired: 10/08/2022 Weeks Of Treatment: 0 Clustered Wound: No Photos Wound Measurements Length: (cm) 0.7 Width: (cm) 1 Depth: (cm) 0.1 Area: (cm) 0.55 Volume: (cm) 0.055 % Reduction in Area: % Reduction in Volume: Epithelialization: Small (1-33%) Tunneling: No Undermining: No Wound Description Classification: Category/Stage III Wound Margin: Flat and Intact Exudate Amount: Medium Exudate Type: Serosanguineous Exudate Color: red, brown Foul Odor After Cleansing: No Slough/Fibrino Yes Wound Bed Granulation Amount: Large (67-100%) Exposed Structure Granulation Quality: Red Fascia Exposed: No Necrotic Amount: Small (1-33%) Fat Layer (Subcutaneous Tissue) Exposed: Yes Necrotic Quality: Adherent Slough Tendon Exposed: No Muscle Exposed: No Joint Exposed: No Bone Exposed: No Periwound Skin Texture Texture Color No Abnormalities Noted: Yes No Abnormalities Noted: Yes Moisture Temperature / Pain No Abnormalities Noted: Yes Temperature: No Abnormality Treatment Notes Wound #35 (Elbow) Wound Laterality: Right Cleanser Peri-Wound Care Sween Lotion (Moisturizing lotion) Discharge Instruction: Apply moisturizing lotion as directed Topical Primary Dressing Maxorb Extra Ag+ Alginate Dressing, 2x2 (in/in) Discharge Instruction: Apply to wound bed as instructed Secondary Dressing Woven Gauze Sponge, Non-Sterile 4x4 in Discharge Instruction: Apply over primary dressing as directed. Secured With [employer], 4.5x3.1 (in/yd) Discharge Instruction: Secure with Kerlix as directed. Transpore Surgical Tape, 2x10 (in/yd) Discharge Instruction: Secure dressing with tape as directed. EDDRICK, BOLEK R ([id number]) [id number] Page 15 of 17 Compression Wrap Compression Stockings Add-Ons Electronic Signature(s) Signed: 10/08/2022 1:38:46 PM By: Dayton Scrape Signed: 10/08/2022 4:32:44 PM By: Zenaida Deed RN, BSN Entered By: Dayton Scrape on 10/08/2022  07:52:55 -------------------------------------------------------------------------------- Wound Assessment Details Patient Name: Date of Service: [name], [name] 10/08/2022 10:30 A M Medical Record Number: [id number] Patient Account Number: [id number] Date of Birth/Sex: Treating RN: August 24, 1945 (77 y.o. Damaris Schooner Primary Care Pamelyn Bancroft: Roselyn Bering, MIKE Other Clinician: Referring Nelda Luckey: Treating Toriann Spadoni/Extender: Marshell Garfinkel, MIKE Weeks in Treatment: 5 Wound Status Wound Number: 36 Primary Etiology: Diabetic Wound/Ulcer of the Lower Extremity Wound Location: Right T Second oe Wound Status: Open Wounding Event: Gradually Appeared Comorbid Anemia, Hypertension, Type II Diabetes, Dementia, History: Paraplegia Date Acquired: 10/08/2022 Weeks Of Treatment: 0 Clustered Wound: No Wound Measurements Length: (cm) 1 Width: (cm) 1 Depth: (cm) 0.1 Area: (cm) 0.785 Volume: (cm) 0.079 % Reduction in Area: % Reduction in Volume: Epithelialization: Small (1-33%) Tunneling: No Undermining: No Wound Description Classification: Grade 1 Wound Margin: Flat and Intact Exudate Amount: Medium Exudate Type: Sanguinous Exudate Color: red Foul Odor After  Cleansing: No Slough/Fibrino Yes Wound Bed Granulation Amount: Large (67-100%) Exposed Structure Granulation Quality: Red Fascia Exposed: No Necrotic Amount: Small (1-33%) Fat Layer (Subcutaneous Tissue) Exposed: Yes Necrotic Quality: Adherent Slough Tendon Exposed: No Muscle Exposed: No Joint Exposed: No Bone Exposed: No Periwound Skin Texture Texture Color No Abnormalities Noted: Yes No Abnormalities Noted: Yes Moisture Temperature / Pain No Abnormalities Noted: No Temperature: No Abnormality Dry / Scaly: Yes Treatment Notes Wound #36 (Toe Second) Wound Laterality: Right Cleanser Peri-Wound Care Sween Lotion (Moisturizing lotion) HUU, DOLL R ([id number]) [id number].pdf Page 16  of 17 Discharge Instruction: Apply moisturizing lotion as directed Topical Primary Dressing Maxorb Extra Ag+ Alginate Dressing, 2x2 (in/in) Discharge Instruction: Apply to wound bed as instructed Secondary Dressing Woven Gauze Sponge, Non-Sterile 4x4 in Discharge Instruction: Apply over primary dressing as directed. Secured With [employer], 4.5x3.1 (in/yd) Discharge Instruction: Secure with Kerlix as directed. Transpore Surgical Tape, 2x10 (in/yd) Discharge Instruction: Secure dressing with tape as directed. Compression Wrap Compression Stockings Add-Ons Electronic Signature(s) Signed: 10/08/2022 4:32:44 PM By: Zenaida Deed RN, BSN Entered By: Zenaida Deed on 10/08/2022 08:07:59 -------------------------------------------------------------------------------- Wound Assessment Details Patient Name: Date of Service: [name], [name] 10/08/2022 10:30 A M Medical Record Number: [id number] Patient Account Number: [id number] Date of Birth/Sex: Treating RN: 11/12/1945 (76 y.o. Damaris Schooner Primary Care Taha Dimond: Roselyn Bering, MIKE Other Clinician: Referring Sayvon Arterberry: Treating Estuardo Frisbee/Extender: Marshell Garfinkel, MIKE Weeks in Treatment: 5 Wound Status Wound Number: 37 Primary Etiology: Diabetic Wound/Ulcer of the Lower Extremity Wound Location: Right, Plantar T Great oe Wound Status: Open Wounding Event: Gradually Appeared Comorbid Anemia, Hypertension, Type II Diabetes, Dementia, History: Paraplegia Date Acquired: 10/08/2022 Weeks Of Treatment: 0 Clustered Wound: No Wound Measurements Length: (cm) 0.5 Width: (cm) 0.5 Depth: (cm) 0.1 Area: (cm) 0.196 Volume: (cm) 0.02 % Reduction in Area: % Reduction in Volume: Epithelialization: Small (1-33%) Tunneling: No Undermining: No Wound Description Classification: Grade 1 Wound Margin: Flat and Intact Exudate Amount: Small Exudate Type: Serosanguineous Exudate Color: red, brown Foul Odor After  Cleansing: No Slough/Fibrino No Wound Bed Granulation Amount: Large (67-100%) Exposed Structure Granulation Quality: Pink Fascia Exposed: No Necrotic Amount: None Present (0%) Fat Layer (Subcutaneous Tissue) Exposed: Yes Tendon Exposed: No Muscle Exposed: No Joint Exposed: No Areola, Ollis R ([id number]) [id number].pdf Page 17 of 17 Bone Exposed: No Periwound Skin Texture Texture Color No Abnormalities Noted: Yes No Abnormalities Noted: Yes Moisture Temperature / Pain No Abnormalities Noted: No Temperature: No Abnormality Dry / Scaly: Yes Treatment Notes Wound #37 (Toe Great) Wound Laterality: Plantar, Right Cleanser Peri-Wound Care Sween Lotion (Moisturizing lotion) Discharge Instruction: Apply moisturizing lotion as directed Topical Primary Dressing Maxorb Extra Ag+ Alginate Dressing, 2x2 (in/in) Discharge Instruction: Apply to wound bed as instructed Secondary Dressing Woven Gauze Sponge, Non-Sterile 4x4 in Discharge Instruction: Apply over primary dressing as directed. Secured With [employer], 4.5x3.1 (in/yd) Discharge Instruction: Secure with Kerlix as directed. Transpore Surgical Tape, 2x10 (in/yd) Discharge Instruction: Secure dressing with tape as directed. Compression Wrap Compression Stockings Add-Ons Electronic Signature(s) Signed: 10/08/2022 4:32:44 PM By: Zenaida Deed RN, BSN Entered By: Zenaida Deed on 10/08/2022 08:11:47 -------------------------------------------------------------------------------- Vitals Details Patient Name: Date of Service: [name], [name] 10/08/2022 10:30 A M Medical Record Number: [id number] Patient Account Number: [id number] Date of Birth/Sex: Treating RN: 1945-12-26 (77 y.o. M) Primary Care Lexton Hidalgo: Roselyn Bering, MIKE Other Clinician: Referring Varnika Butz: Treating Tully Burgo/Extender: Marshell Garfinkel, MIKE Weeks in Treatment: 5 Vital Signs Time Taken: 10:22 Temperature (F):  97.9 Pulse (bpm): 79  Diabetic Wound/Ulcer of the Lower Extremity Wound Location: Left Calcaneus Wound Status: Open Wounding Event: Pressure Injury Comorbid Anemia, Hypertension, Type II  Diabetes, Dementia, History: Paraplegia Date Acquired: 08/14/2022 Weeks Of Treatment: 5 Clustered Wound: No Photos Wound Measurements Length: (cm) 0.6 Width: (cm) 0.5 Depth: (cm) 0.1 Area: (cm) 0.236 Volume: (cm) 0.024 % Reduction in Area: 80% % Reduction in Volume: 79.7% Epithelialization: Large (67-100%) Tunneling: No Undermining: No Wound Description Classification: Grade 1 Wound Margin: Distinct, outline attached Exudate Amount: Medium Exudate Type: Serosanguineous Exudate Color: red, brown Foul Odor After Cleansing: No Slough/Fibrino No Wound Bed Granulation Amount: Large (67-100%) Exposed Structure Granulation Quality: Red Fascia Exposed: No Necrotic Amount: None Present (0%) Fat Layer (Subcutaneous Tissue) Exposed: Yes Tendon Exposed: No Muscle Exposed: No Joint Exposed: No Bone Exposed: No Periwound Skin Texture Texture Color No Abnormalities Noted: Yes No Abnormalities Noted: No Rubor: No Moisture No Abnormalities Noted: No Temperature / Pain Dry / Scaly: Yes Temperature: No Abnormality Treatment Notes Wound #33 (Calcaneus) Wound Laterality: Left Cleanser Soap and Water Discharge Instruction: May shower and wash wound with dial antibacterial soap and water prior to dressing change. Peri-Wound Care Triamcinolone 15 (g) Discharge Instruction: Use triamcinolone 15 (g) as directed Sween Lotion (Moisturizing lotion) Discharge Instruction: Apply moisturizing lotion as directed Topical Primary Dressing Maxorb Extra Ag+ Alginate Dressing, 2x2 (in/in) Discharge Instruction: Apply to wound bed as instructed RITCHIE, PROIETTI ([id number]) [id number] Page 12 of 17 Secondary Dressing ALLEVYN Heel 4 1/2in x 5 1/2in / 10.5cm x 13.5cm Discharge Instruction: Apply over primary dressing as directed. Woven Gauze Sponge, Non-Sterile 4x4 in Discharge Instruction: Apply over primary dressing as directed. Secured With [employer],  4.5x3.1 (in/yd) Discharge Instruction: Secure with Kerlix as directed. Transpore Surgical Tape, 2x10 (in/yd) Discharge Instruction: Secure dressing with tape as directed. Compression Wrap Compression Stockings Add-Ons Electronic Signature(s) Signed: 10/08/2022 1:38:46 PM By: Dayton Scrape Signed: 10/08/2022 4:32:44 PM By: Zenaida Deed RN, BSN Entered By: Dayton Scrape on 10/08/2022 07:49:18 -------------------------------------------------------------------------------- Wound Assessment Details Patient Name: Date of Service: [name], [name] 10/08/2022 10:30 A M Medical Record Number: [id number] Patient Account Number: [id number] Date of Birth/Sex: Treating RN: [birth year] (77 y.o. Damaris Schooner Primary Care Rigo Letts: Roselyn Bering, MIKE Other Clinician: Referring Marinna Blane: Treating Gerrianne Aydelott/Extender: Marshell Garfinkel, MIKE Weeks in Treatment: 5 Wound Status Wound Number: 34 Primary Etiology: Diabetic Wound/Ulcer of the Lower Extremity Wound Location: Right T Third oe Wound Status: Open Wounding Event: Gradually Appeared Comorbid Anemia, Hypertension, Type II Diabetes, Dementia, History: Paraplegia Date Acquired: 10/08/2022 Weeks Of Treatment: 0 Clustered Wound: No Photos Wound Measurements Length: (cm) 0.5 Width: (cm) 0.3 Depth: (cm) 0.1 Area: (cm) 0.118 Volume: (cm) 0.012 % Reduction in Area: % Reduction in Volume: Epithelialization: Small (1-33%) Tunneling: No Undermining: No Wound Description Classification: Grade 1 Wound Margin: Flat and Intact Exudate Amount: Medium Exudate Type: Serosanguineous Chianese, Xyler R ([id number]) Exudate Color: red, brown Foul Odor After Cleansing: No Slough/Fibrino No [id number] Page 13 of 17 Wound Bed Granulation Amount: Large (67-100%) Exposed Structure Granulation Quality: Red Fascia Exposed: No Necrotic Amount: None Present (0%) Fat Layer (Subcutaneous Tissue) Exposed: Yes Tendon Exposed:  No Muscle Exposed: No Joint Exposed: No Bone Exposed: No Periwound Skin Texture Texture Color No Abnormalities Noted: Yes No Abnormalities Noted: Yes Moisture Temperature / Pain No Abnormalities Noted: No Tenderness on Palpation: Yes Dry / Scaly: Yes Treatment Notes Wound #34 (Toe Third) Wound Laterality: Right Cleanser Peri-Wound Care Sween Lotion (Moisturizing lotion) Discharge Instruction: Apply moisturizing lotion as directed  and Medication Current Pain Management: Notes reports his feet are sore Electronic Signature(s) Signed: 10/08/2022 4:32:44 PM By: Zenaida Deed RN, BSN Entered By: Zenaida Deed on 10/08/2022 07:31:37 -------------------------------------------------------------------------------- Patient/Caregiver Education Details Patient Name: Date of Service: [name] RD R. 9/23/2024andnbsp10:30 A M Medical Record Number: [id number] Patient Account Number: [id number] Date of Birth/Gender: Treating RN: [birth year] (77 y.o. Damaris Schooner Primary Care Physician: Roselyn Bering, MIKE Other Clinician: Referring Physician: Treating Physician/Extender: Marshell Garfinkel, MIKE Weeks in Treatment: [address] [place] ([id number]) [id number] Page 9 of 17 Education Assessment Education Provided To: Patient Education Topics Provided Pressure: Methods: Explain/Verbal Responses: Reinforcements needed, State content correctly Wound/Skin Impairment: Methods: Explain/Verbal Responses: Reinforcements needed, State content  correctly Electronic Signature(s) Signed: 10/08/2022 4:32:44 PM By: Zenaida Deed RN, BSN Entered By: Zenaida Deed on 10/08/2022 07:22:59 -------------------------------------------------------------------------------- Wound Assessment Details Patient Name: Date of Service: [name], [name] 10/08/2022 10:30 A M Medical Record Number: [id number] Patient Account Number: [id number] Date of Birth/Sex: Treating RN: 12-12-1945 (77 y.o. Damaris Schooner Primary Care Tyyne Cliett: Roselyn Bering, MIKE Other Clinician: Referring Isadora Delorey: Treating Vivaan Helseth/Extender: Marshell Garfinkel, MIKE Weeks in Treatment: 5 Wound Status Wound Number: 32 Primary Etiology: Pressure Ulcer Wound Location: Right, Lateral Lower Leg Wound Status: Open Wounding Event: Pressure Injury Comorbid Anemia, Hypertension, Type II Diabetes, Dementia, History: Paraplegia Date Acquired: 08/15/2022 Weeks Of Treatment: 5 Clustered Wound: No Photos Wound Measurements Length: (cm) 4.5 Width: (cm) 3 Depth: (cm) 0.1 Area: (cm) 10.603 Volume: (cm) 1.06 % Reduction in Area: -1975% % Reduction in Volume: -1978.4% Epithelialization: Medium (34-66%) Tunneling: No Undermining: No Wound Description Classification: Category/Stage II Wound Margin: Distinct, outline attached Exudate Amount: Medium Exudate Type: Serosanguineous Exudate Color: red, brown JERRET, BUTORAC R ([id number]) Wound Bed Granulation Amount: Large (67-100%) Granulation Quality: Red Necrotic Amount: None Present (0%) Foul Odor After Cleansing: No Slough/Fibrino No [id number].pdf Page 10 of 17 Exposed Structure Fascia Exposed: No Fat Layer (Subcutaneous Tissue) Exposed: Yes Tendon Exposed: No Muscle Exposed: No Joint Exposed: No Bone Exposed: No Periwound Skin Texture Texture Color No Abnormalities Noted: Yes No Abnormalities Noted: No Hemosiderin Staining: Yes Moisture Rubor: No No Abnormalities Noted: No Dry /  Scaly: Yes Temperature / Pain Temperature: No Abnormality Treatment Notes Wound #32 (Lower Leg) Wound Laterality: Right, Lateral Cleanser Soap and Water Discharge Instruction: May shower and wash wound with dial antibacterial soap and water prior to dressing change. Peri-Wound Care Triamcinolone 15 (g) Discharge Instruction: Use triamcinolone 15 (g) as directed Sween Lotion (Moisturizing lotion) Discharge Instruction: Apply moisturizing lotion as directed Topical Primary Dressing Maxorb Extra Ag+ Alginate Dressing, 2x2 (in/in) Discharge Instruction: Apply to wound bed as instructed Secondary Dressing Woven Gauze Sponge, Non-Sterile 4x4 in Discharge Instruction: Apply over primary dressing as directed. Secured With [employer], 4.5x3.1 (in/yd) Discharge Instruction: Secure with Kerlix as directed. Transpore Surgical Tape, 2x10 (in/yd) Discharge Instruction: Secure dressing with tape as directed. Compression Wrap Compression Stockings Add-Ons Electronic Signature(s) Signed: 10/08/2022 1:38:46 PM By: Dayton Scrape Signed: 10/08/2022 4:32:44 PM By: Zenaida Deed RN, BSN Entered By: Dayton Scrape on 10/08/2022 07:46:10 -------------------------------------------------------------------------------- Wound Assessment Details Patient Name: Date of Service: [name], [name] 10/08/2022 10:30 A M Medical Record Number: [id number] Patient Account Number: [id number] Date of Birth/Sex: Treating RN: 19-Aug-1945 (77 y.o. Damaris Schooner Primary Care Mariela Rex: Roselyn Bering, MIKE Other Clinician: Referring Coy Vandoren: Treating Clevon Khader/Extender: Marshell Garfinkel, MIKE Weeks in Treatment: [address], [place] R ([id number]) [id number].pdf Page 11 of 17 Wound Status Wound Number: 33 Primary Etiology:  Cleansing: No Slough/Fibrino Yes Wound Bed Granulation Amount: Large (67-100%) Exposed Structure Granulation Quality: Red Fascia Exposed: No Necrotic Amount: Small (1-33%) Fat Layer (Subcutaneous Tissue) Exposed: Yes Necrotic Quality: Adherent Slough Tendon Exposed: No Muscle Exposed: No Joint Exposed: No Bone Exposed: No Periwound Skin Texture Texture Color No Abnormalities Noted: Yes No Abnormalities Noted: Yes Moisture Temperature / Pain No Abnormalities Noted: No Temperature: No Abnormality Dry / Scaly: Yes Treatment Notes Wound #36 (Toe Second) Wound Laterality: Right Cleanser Peri-Wound Care Sween Lotion (Moisturizing lotion) HUU, DOLL R ([id number]) [phone].pdf Page 16  of 17 Discharge Instruction: Apply moisturizing lotion as directed Topical Primary Dressing Maxorb Extra Ag+ Alginate Dressing, 2x2 (in/in) Discharge Instruction: Apply to wound bed as instructed Secondary Dressing Woven Gauze Sponge, Non-Sterile 4x4 in Discharge Instruction: Apply over primary dressing as directed. Secured With [employer], 4.5x3.1 (in/yd) Discharge Instruction: Secure with Kerlix as directed. Transpore Surgical Tape, 2x10 (in/yd) Discharge Instruction: Secure dressing with tape as directed. Compression Wrap Compression Stockings Add-Ons Electronic Signature(s) Signed: 10/08/2022 4:32:44 PM By: Zenaida Deed RN, BSN Entered By: Zenaida Deed on 10/08/2022 08:07:59 -------------------------------------------------------------------------------- Wound Assessment Details Patient Name: Date of Service: [name], [name] 10/08/2022 10:30 A M Medical Record Number: [id number] Patient Account Number: [id number] Date of Birth/Sex: Treating RN: 11/12/1945 (76 y.o. Damaris Schooner Primary Care Taha Dimond: Roselyn Bering, MIKE Other Clinician: Referring Sayvon Arterberry: Treating Estuardo Frisbee/Extender: Marshell Garfinkel, MIKE Weeks in Treatment: 5 Wound Status Wound Number: 37 Primary Etiology: Diabetic Wound/Ulcer of the Lower Extremity Wound Location: Right, Plantar T Great oe Wound Status: Open Wounding Event: Gradually Appeared Comorbid Anemia, Hypertension, Type II Diabetes, Dementia, History: Paraplegia Date Acquired: 10/08/2022 Weeks Of Treatment: 0 Clustered Wound: No Wound Measurements Length: (cm) 0.5 Width: (cm) 0.5 Depth: (cm) 0.1 Area: (cm) 0.196 Volume: (cm) 0.02 % Reduction in Area: % Reduction in Volume: Epithelialization: Small (1-33%) Tunneling: No Undermining: No Wound Description Classification: Grade 1 Wound Margin: Flat and Intact Exudate Amount: Small Exudate Type: Serosanguineous Exudate Color: red, brown Foul Odor After  Cleansing: No Slough/Fibrino No Wound Bed Granulation Amount: Large (67-100%) Exposed Structure Granulation Quality: Pink Fascia Exposed: No Necrotic Amount: None Present (0%) Fat Layer (Subcutaneous Tissue) Exposed: Yes Tendon Exposed: No Muscle Exposed: No Joint Exposed: No Areola, Ollis R ([id number]) [id number] Page 17 of 17 Bone Exposed: No Periwound Skin Texture Texture Color No Abnormalities Noted: Yes No Abnormalities Noted: Yes Moisture Temperature / Pain No Abnormalities Noted: No Temperature: No Abnormality Dry / Scaly: Yes Treatment Notes Wound #37 (Toe Great) Wound Laterality: Plantar, Right Cleanser Peri-Wound Care Sween Lotion (Moisturizing lotion) Discharge Instruction: Apply moisturizing lotion as directed Topical Primary Dressing Maxorb Extra Ag+ Alginate Dressing, 2x2 (in/in) Discharge Instruction: Apply to wound bed as instructed Secondary Dressing Woven Gauze Sponge, Non-Sterile 4x4 in Discharge Instruction: Apply over primary dressing as directed. Secured With [employer], 4.5x3.1 (in/yd) Discharge Instruction: Secure with Kerlix as directed. Transpore Surgical Tape, 2x10 (in/yd) Discharge Instruction: Secure dressing with tape as directed. Compression Wrap Compression Stockings Add-Ons Electronic Signature(s) Signed: 10/08/2022 4:32:44 PM By: Zenaida Deed RN, BSN Entered By: Zenaida Deed on 10/08/2022 08:11:47 -------------------------------------------------------------------------------- Vitals Details Patient Name: Date of Service: [name], [name] 10/08/2022 10:30 A M Medical Record Number: [id number] Patient Account Number: [id number] Date of Birth/Sex: Treating RN: 1945-12-26 (77 y.o. M) Primary Care Lexton Hidalgo: Roselyn Bering, MIKE Other Clinician: Referring Varnika Butz: Treating Tully Burgo/Extender: Marshell Garfinkel, MIKE Weeks in Treatment: 5 Vital Signs Time Taken: 10:22 Temperature (F):  97.9 Pulse (bpm): 79  Topical Primary Dressing Maxorb Extra Ag+ Alginate Dressing, 2x2 (in/in) Discharge Instruction: Apply to wound bed as instructed Secondary Dressing Woven Gauze Sponge, Non-Sterile 4x4 in Discharge Instruction: Apply over primary dressing as directed. Secured With [employer], 4.5x3.1 (in/yd) Discharge Instruction: Secure with Kerlix as directed. Transpore Surgical Tape, 2x10 (in/yd) Discharge Instruction: Secure dressing with tape as directed. Compression Wrap Compression Stockings Add-Ons Electronic Signature(s) Signed: 10/08/2022 1:38:46 PM By: Dayton Scrape Signed: 10/08/2022 4:32:44 PM By: Zenaida Deed RN, BSN Entered By: Dayton Scrape on 10/08/2022 07:50:05 -------------------------------------------------------------------------------- Wound Assessment Details Patient Name: Date of Service: [name], [name] 10/08/2022 10:30 A M Medical Record Number: [id number] Patient Account Number: [id number] Date of Birth/Sex: Treating RN: 04-25-1945 (77 y.o. Damaris Schooner Primary Care Gabriele Loveland: Roselyn Bering, MIKE Other Clinician: Referring Ilze Roselli: Treating Kaysea Raya/Extender: Marshell Garfinkel, MIKE Weeks in Treatment: 5 Wound Status Wound Number: 35 Primary Etiology: Pressure Ulcer Wound Location: Right Elbow Wound Status: Open CORDON, LARI R ([id number]) [id number] Page 14 of 17 Wounding Event: Pressure Injury Comorbid Anemia, Hypertension, Type II Diabetes, Dementia, History: Paraplegia Date  Acquired: 10/08/2022 Weeks Of Treatment: 0 Clustered Wound: No Photos Wound Measurements Length: (cm) 0.7 Width: (cm) 1 Depth: (cm) 0.1 Area: (cm) 0.55 Volume: (cm) 0.055 % Reduction in Area: % Reduction in Volume: Epithelialization: Small (1-33%) Tunneling: No Undermining: No Wound Description Classification: Category/Stage III Wound Margin: Flat and Intact Exudate Amount: Medium Exudate Type: Serosanguineous Exudate Color: red, brown Foul Odor After Cleansing: No Slough/Fibrino Yes Wound Bed Granulation Amount: Large (67-100%) Exposed Structure Granulation Quality: Red Fascia Exposed: No Necrotic Amount: Small (1-33%) Fat Layer (Subcutaneous Tissue) Exposed: Yes Necrotic Quality: Adherent Slough Tendon Exposed: No Muscle Exposed: No Joint Exposed: No Bone Exposed: No Periwound Skin Texture Texture Color No Abnormalities Noted: Yes No Abnormalities Noted: Yes Moisture Temperature / Pain No Abnormalities Noted: Yes Temperature: No Abnormality Treatment Notes Wound #35 (Elbow) Wound Laterality: Right Cleanser Peri-Wound Care Sween Lotion (Moisturizing lotion) Discharge Instruction: Apply moisturizing lotion as directed Topical Primary Dressing Maxorb Extra Ag+ Alginate Dressing, 2x2 (in/in) Discharge Instruction: Apply to wound bed as instructed Secondary Dressing Woven Gauze Sponge, Non-Sterile 4x4 in Discharge Instruction: Apply over primary dressing as directed. Secured With [employer], 4.5x3.1 (in/yd) Discharge Instruction: Secure with Kerlix as directed. Transpore Surgical Tape, 2x10 (in/yd) Discharge Instruction: Secure dressing with tape as directed. EDDRICK, BOLEK R ([id number]) [id number] Page 15 of 17 Compression Wrap Compression Stockings Add-Ons Electronic Signature(s) Signed: 10/08/2022 1:38:46 PM By: Dayton Scrape Signed: 10/08/2022 4:32:44 PM By: Zenaida Deed RN, BSN Entered By: Dayton Scrape on 10/08/2022  07:52:55 -------------------------------------------------------------------------------- Wound Assessment Details Patient Name: Date of Service: [name], [name] 10/08/2022 10:30 A M Medical Record Number: [id number] Patient Account Number: [id number] Date of Birth/Sex: Treating RN: August 24, 1945 (77 y.o. Damaris Schooner Primary Care Pamelyn Bancroft: Roselyn Bering, MIKE Other Clinician: Referring Nelda Luckey: Treating Toriann Spadoni/Extender: Marshell Garfinkel, MIKE Weeks in Treatment: 5 Wound Status Wound Number: 36 Primary Etiology: Diabetic Wound/Ulcer of the Lower Extremity Wound Location: Right T Second oe Wound Status: Open Wounding Event: Gradually Appeared Comorbid Anemia, Hypertension, Type II Diabetes, Dementia, History: Paraplegia Date Acquired: 10/08/2022 Weeks Of Treatment: 0 Clustered Wound: No Wound Measurements Length: (cm) 1 Width: (cm) 1 Depth: (cm) 0.1 Area: (cm) 0.785 Volume: (cm) 0.079 % Reduction in Area: % Reduction in Volume: Epithelialization: Small (1-33%) Tunneling: No Undermining: No Wound Description Classification: Grade 1 Wound Margin: Flat and Intact Exudate Amount: Medium Exudate Type: Sanguinous Exudate Color: red Foul Odor After  and Medication Current Pain Management: Notes reports his feet are sore Electronic Signature(s) Signed: 10/08/2022 4:32:44 PM By: Zenaida Deed RN, BSN Entered By: Zenaida Deed on 10/08/2022 07:31:37 -------------------------------------------------------------------------------- Patient/Caregiver Education Details Patient Name: Date of Service: [name] RD R. 9/23/2024andnbsp10:30 A M Medical Record Number: [id number] Patient Account Number: [id number] Date of Birth/Gender: Treating RN: [birth year] (77 y.o. Damaris Schooner Primary Care Physician: Roselyn Bering, MIKE Other Clinician: Referring Physician: Treating Physician/Extender: Marshell Garfinkel, MIKE Weeks in Treatment: [address] [place] R ([id number]) [id number] Page 9 of 17 Education Assessment Education Provided To: Patient Education Topics Provided Pressure: Methods: Explain/Verbal Responses: Reinforcements needed, State content correctly Wound/Skin Impairment: Methods: Explain/Verbal Responses: Reinforcements needed, State content  correctly Electronic Signature(s) Signed: 10/08/2022 4:32:44 PM By: Zenaida Deed RN, BSN Entered By: Zenaida Deed on 10/08/2022 07:22:59 -------------------------------------------------------------------------------- Wound Assessment Details Patient Name: Date of Service: [name], [name] 10/08/2022 10:30 A M Medical Record Number: [id number] Patient Account Number: [id number] Date of Birth/Sex: Treating RN: 12-12-1945 (77 y.o. Damaris Schooner Primary Care Tyyne Cliett: Roselyn Bering, MIKE Other Clinician: Referring Isadora Delorey: Treating Vivaan Helseth/Extender: Marshell Garfinkel, MIKE Weeks in Treatment: 5 Wound Status Wound Number: 32 Primary Etiology: Pressure Ulcer Wound Location: Right, Lateral Lower Leg Wound Status: Open Wounding Event: Pressure Injury Comorbid Anemia, Hypertension, Type II Diabetes, Dementia, History: Paraplegia Date Acquired: 08/15/2022 Weeks Of Treatment: 5 Clustered Wound: No Photos Wound Measurements Length: (cm) 4.5 Width: (cm) 3 Depth: (cm) 0.1 Area: (cm) 10.603 Volume: (cm) 1.06 % Reduction in Area: -1975% % Reduction in Volume: -1978.4% Epithelialization: Medium (34-66%) Tunneling: No Undermining: No Wound Description Classification: Category/Stage II Wound Margin: Distinct, outline attached Exudate Amount: Medium Exudate Type: Serosanguineous Exudate Color: red, brown JERRET, BUTORAC R ([id number]) Wound Bed Granulation Amount: Large (67-100%) Granulation Quality: Red Necrotic Amount: None Present (0%) Foul Odor After Cleansing: No Slough/Fibrino No [id number] Page 10 of 17 Exposed Structure Fascia Exposed: No Fat Layer (Subcutaneous Tissue) Exposed: Yes Tendon Exposed: No Muscle Exposed: No Joint Exposed: No Bone Exposed: No Periwound Skin Texture Texture Color No Abnormalities Noted: Yes No Abnormalities Noted: No Hemosiderin Staining: Yes Moisture Rubor: No No Abnormalities Noted: No Dry /  Scaly: Yes Temperature / Pain Temperature: No Abnormality Treatment Notes Wound #32 (Lower Leg) Wound Laterality: Right, Lateral Cleanser Soap and Water Discharge Instruction: May shower and wash wound with dial antibacterial soap and water prior to dressing change. Peri-Wound Care Triamcinolone 15 (g) Discharge Instruction: Use triamcinolone 15 (g) as directed Sween Lotion (Moisturizing lotion) Discharge Instruction: Apply moisturizing lotion as directed Topical Primary Dressing Maxorb Extra Ag+ Alginate Dressing, 2x2 (in/in) Discharge Instruction: Apply to wound bed as instructed Secondary Dressing Woven Gauze Sponge, Non-Sterile 4x4 in Discharge Instruction: Apply over primary dressing as directed. Secured With [employer], 4.5x3.1 (in/yd) Discharge Instruction: Secure with Kerlix as directed. Transpore Surgical Tape, 2x10 (in/yd) Discharge Instruction: Secure dressing with tape as directed. Compression Wrap Compression Stockings Add-Ons Electronic Signature(s) Signed: 10/08/2022 1:38:46 PM By: Dayton Scrape Signed: 10/08/2022 4:32:44 PM By: Zenaida Deed RN, BSN Entered By: Dayton Scrape on 10/08/2022 07:46:10 -------------------------------------------------------------------------------- Wound Assessment Details Patient Name: Date of Service: [name], [name] 10/08/2022 10:30 A M Medical Record Number: [id number] Patient Account Number: [id number] Date of Birth/Sex: Treating RN: 19-Aug-1945 (77 y.o. Damaris Schooner Primary Care Mariela Rex: Roselyn Bering, MIKE Other Clinician: Referring Coy Vandoren: Treating Clevon Khader/Extender: Marshell Garfinkel, MIKE Weeks in Treatment: [address], [place] R ([id number]) [id number] Page 11 of 17 Wound Status Wound Number: 33 Primary Etiology:  Topical Primary Dressing Maxorb Extra Ag+ Alginate Dressing, 2x2 (in/in) Discharge Instruction: Apply to wound bed as instructed Secondary Dressing Woven Gauze Sponge, Non-Sterile 4x4 in Discharge Instruction: Apply over primary dressing as directed. Secured With [employer], 4.5x3.1 (in/yd) Discharge Instruction: Secure with Kerlix as directed. Transpore Surgical Tape, 2x10 (in/yd) Discharge Instruction: Secure dressing with tape as directed. Compression Wrap Compression Stockings Add-Ons Electronic Signature(s) Signed: 10/08/2022 1:38:46 PM By: Dayton Scrape Signed: 10/08/2022 4:32:44 PM By: Zenaida Deed RN, BSN Entered By: Dayton Scrape on 10/08/2022 07:50:05 -------------------------------------------------------------------------------- Wound Assessment Details Patient Name: Date of Service: [name], [name] 10/08/2022 10:30 A M Medical Record Number: [id number] Patient Account Number: [id number] Date of Birth/Sex: Treating RN: 04-25-1945 (77 y.o. Damaris Schooner Primary Care Gabriele Loveland: Roselyn Bering, MIKE Other Clinician: Referring Ilze Roselli: Treating Kaysea Raya/Extender: Marshell Garfinkel, MIKE Weeks in Treatment: 5 Wound Status Wound Number: 35 Primary Etiology: Pressure Ulcer Wound Location: Right Elbow Wound Status: Open CORDON, LARI R ([id number]) [id number] Page 14 of 17 Wounding Event: Pressure Injury Comorbid Anemia, Hypertension, Type II Diabetes, Dementia, History: Paraplegia Date  Acquired: 10/08/2022 Weeks Of Treatment: 0 Clustered Wound: No Photos Wound Measurements Length: (cm) 0.7 Width: (cm) 1 Depth: (cm) 0.1 Area: (cm) 0.55 Volume: (cm) 0.055 % Reduction in Area: % Reduction in Volume: Epithelialization: Small (1-33%) Tunneling: No Undermining: No Wound Description Classification: Category/Stage III Wound Margin: Flat and Intact Exudate Amount: Medium Exudate Type: Serosanguineous Exudate Color: red, brown Foul Odor After Cleansing: No Slough/Fibrino Yes Wound Bed Granulation Amount: Large (67-100%) Exposed Structure Granulation Quality: Red Fascia Exposed: No Necrotic Amount: Small (1-33%) Fat Layer (Subcutaneous Tissue) Exposed: Yes Necrotic Quality: Adherent Slough Tendon Exposed: No Muscle Exposed: No Joint Exposed: No Bone Exposed: No Periwound Skin Texture Texture Color No Abnormalities Noted: Yes No Abnormalities Noted: Yes Moisture Temperature / Pain No Abnormalities Noted: Yes Temperature: No Abnormality Treatment Notes Wound #35 (Elbow) Wound Laterality: Right Cleanser Peri-Wound Care Sween Lotion (Moisturizing lotion) Discharge Instruction: Apply moisturizing lotion as directed Topical Primary Dressing Maxorb Extra Ag+ Alginate Dressing, 2x2 (in/in) Discharge Instruction: Apply to wound bed as instructed Secondary Dressing Woven Gauze Sponge, Non-Sterile 4x4 in Discharge Instruction: Apply over primary dressing as directed. Secured With [employer], 4.5x3.1 (in/yd) Discharge Instruction: Secure with Kerlix as directed. Transpore Surgical Tape, 2x10 (in/yd) Discharge Instruction: Secure dressing with tape as directed. EDDRICK, BOLEK R ([id number]) [id number] Page 15 of 17 Compression Wrap Compression Stockings Add-Ons Electronic Signature(s) Signed: 10/08/2022 1:38:46 PM By: Dayton Scrape Signed: 10/08/2022 4:32:44 PM By: Zenaida Deed RN, BSN Entered By: Dayton Scrape on 10/08/2022  07:52:55 -------------------------------------------------------------------------------- Wound Assessment Details Patient Name: Date of Service: [name], [name] 10/08/2022 10:30 A M Medical Record Number: [id number] Patient Account Number: [id number] Date of Birth/Sex: Treating RN: August 24, 1945 (77 y.o. Damaris Schooner Primary Care Pamelyn Bancroft: Roselyn Bering, MIKE Other Clinician: Referring Nelda Luckey: Treating Toriann Spadoni/Extender: Marshell Garfinkel, MIKE Weeks in Treatment: 5 Wound Status Wound Number: 36 Primary Etiology: Diabetic Wound/Ulcer of the Lower Extremity Wound Location: Right T Second oe Wound Status: Open Wounding Event: Gradually Appeared Comorbid Anemia, Hypertension, Type II Diabetes, Dementia, History: Paraplegia Date Acquired: 10/08/2022 Weeks Of Treatment: 0 Clustered Wound: No Wound Measurements Length: (cm) 1 Width: (cm) 1 Depth: (cm) 0.1 Area: (cm) 0.785 Volume: (cm) 0.079 % Reduction in Area: % Reduction in Volume: Epithelialization: Small (1-33%) Tunneling: No Undermining: No Wound Description Classification: Grade 1 Wound Margin: Flat and Intact Exudate Amount: Medium Exudate Type: Sanguinous Exudate Color: red Foul Odor After

## 2022-11-05 ENCOUNTER — Encounter (HOSPITAL_BASED_OUTPATIENT_CLINIC_OR_DEPARTMENT_OTHER): Payer: Medicare HMO | Attending: General Surgery | Admitting: General Surgery

## 2022-12-26 ENCOUNTER — Emergency Department (HOSPITAL_COMMUNITY)
Admission: EM | Admit: 2022-12-26 | Discharge: 2022-12-26 | Disposition: A | Discharge status: Home / Self Care | Payer: Medicare HMO | Attending: Emergency Medicine | Admitting: Emergency Medicine

## 2022-12-26 ENCOUNTER — Other Ambulatory Visit: Payer: Self-pay

## 2022-12-26 DIAGNOSIS — Z7984 Long term (current) use of oral hypoglycemic drugs: Secondary | ICD-10-CM | POA: Insufficient documentation

## 2022-12-26 DIAGNOSIS — E875 Hyperkalemia: Secondary | ICD-10-CM | POA: Diagnosis present

## 2022-12-26 DIAGNOSIS — L89152 Pressure ulcer of sacral region, stage 2: Secondary | ICD-10-CM | POA: Insufficient documentation

## 2022-12-26 DIAGNOSIS — Z79899 Other long term (current) drug therapy: Secondary | ICD-10-CM | POA: Insufficient documentation

## 2022-12-26 DIAGNOSIS — Z7982 Long term (current) use of aspirin: Secondary | ICD-10-CM | POA: Diagnosis not present

## 2022-12-26 DIAGNOSIS — Z794 Long term (current) use of insulin: Secondary | ICD-10-CM | POA: Diagnosis not present

## 2022-12-26 LAB — I-STAT CHEM 8, ED
BUN: 26 mg/dL — ABNORMAL HIGH (ref 8–23)
Calcium, Ion: 1.22 mmol/L (ref 1.15–1.40)
Chloride: 112 mmol/L — ABNORMAL HIGH (ref 98–111)
Creatinine, Ser: 0.7 mg/dL (ref 0.61–1.24)
Glucose, Bld: 136 mg/dL — ABNORMAL HIGH (ref 70–99)
HCT: 32 % — ABNORMAL LOW (ref 39.0–52.0)
Hemoglobin: 10.9 g/dL — ABNORMAL LOW (ref 13.0–17.0)
Potassium: 6 mmol/L — ABNORMAL HIGH (ref 3.5–5.1)
Sodium: 139 mmol/L (ref 135–145)
TCO2: 17 mmol/L — ABNORMAL LOW (ref 22–32)

## 2022-12-26 LAB — COMPREHENSIVE METABOLIC PANEL
ALT: 11 U/L (ref 0–44)
AST: 15 U/L (ref 15–41)
Albumin: 3.1 g/dL — ABNORMAL LOW (ref 3.5–5.0)
Alkaline Phosphatase: 109 U/L (ref 38–126)
Anion gap: 4 — ABNORMAL LOW (ref 5–15)
BUN: 28 mg/dL — ABNORMAL HIGH (ref 8–23)
CO2: 17 mmol/L — ABNORMAL LOW (ref 22–32)
Calcium: 8.4 mg/dL — ABNORMAL LOW (ref 8.9–10.3)
Chloride: 111 mmol/L (ref 98–111)
Creatinine, Ser: 0.8 mg/dL (ref 0.61–1.24)
GFR, Estimated: >60 mL/min (ref >60)
Glucose, Bld: 157 mg/dL — ABNORMAL HIGH (ref 70–99)
Potassium: 6 mmol/L — ABNORMAL HIGH (ref 3.5–5.1)
Sodium: 132 mmol/L — ABNORMAL LOW (ref 135–145)
Total Bilirubin: 0.6 mg/dL (ref <1.2)
Total Protein: 7.2 g/dL (ref 6.5–8.1)

## 2022-12-26 MED ORDER — SODIUM ZIRCONIUM CYCLOSILICATE 10 G PO PACK: 10.0000 g | PACK | Freq: Once | ORAL | Status: DC

## 2022-12-26 MED ORDER — LOKELMA 10 G PO PACK: 10.0000 g | PACK | Freq: Two times a day (BID) | ORAL | 0 refills | Status: DC

## 2022-12-26 MED ORDER — OXYCODONE-ACETAMINOPHEN 5-325 MG PO TABS
2.0000 | ORAL_TABLET | Freq: Once | ORAL | Status: AC
  Administered 2022-12-26: 2 via ORAL
  Filled 2022-12-26: qty 2

## 2022-12-26 MED ORDER — SODIUM ZIRCONIUM CYCLOSILICATE 10 G PO PACK
10.0000 g | PACK | Freq: Once | ORAL | Status: AC
  Administered 2022-12-26: 10 g via ORAL
  Filled 2022-12-26: qty 1

## 2022-12-26 NOTE — Discharge Instructions (Addendum)
I recommend that you take Lokelma 10 mg twice daily for 2 days.  Your potassium is slightly elevated at 6 today.  We discussed case with nephrologist and they recommend repeat potassium level on Monday.   Please make sure you do not give him any Ensure  Return to ER if your potassium is over 6 or if you are having severe pain.

## 2022-12-26 NOTE — ED Triage Notes (Signed)
Patient brought in by EMS from home with c/o abnormal labs, Potassium 6.7. Patient has no complaints denies pain or discomfort at this time. His daughter is his caregiver and states he is non-compliant with medications. Has sacral wound but treatment is on going.  170/80 80 98% RA CBG: 211

## 2022-12-26 NOTE — ED Notes (Signed)
[facility] transport setup for pt

## 2022-12-26 NOTE — ED Provider Notes (Addendum)
[place] EMERGENCY DEPARTMENT AT [facility] Provider Note   CSN: [id number] Arrival date & time: 12/26/22  1746     History  Chief Complaint  Patient presents with  . Abnormal Labs    [name] [name] is a 77 y.o. adult history of diabetes, quadriplegia, previous right arm amputation here presenting with hyperkalemia.  Patient had labs drawn on December 4 that showed potassium of 6.4.  Patient was supposed to be sent here on the 6 to repeat potassium but was not sent here.  Patient had blood work done this morning at the doctor's office and potassium was 6.2 and then patient was sent here.  Patient has no complaints.  Patient denies any vomiting.  Patient has known sacral decub ulcer  The history is provided by the patient.       Home Medications Prior to Admission medications   Medication Sig Start Date End Date Taking? Authorizing Provider  acetaminophen (TYLENOL) 500 MG tablet Take 1,000 mg by mouth in the morning and at bedtime.    [provider]  aspirin EC 81 MG EC tablet Take 1 tablet (81 mg total) by mouth daily. Swallow whole. 01/25/21   Leroy Sea, MD  B COMPLEX-BIOTIN-FA PO Take 1 tablet by mouth daily.    [provider]  Chlorhexidine Gluconate (HIBICLENS EX) Apply 1 application topically See admin instructions. Cleanse skin scabs and lesions every 72 hours    [provider]  famotidine (PEPCID) 20 MG tablet Take 20 mg by mouth daily.    [provider]  ferrous gluconate (FERGON) 324 MG tablet Take 324 mg by mouth in the morning and at bedtime.    [provider]  fludrocortisone (FLORINEF) 0.1 MG tablet Take 0.5 tablets (0.05 mg total) by mouth daily. 02/09/21   Regalado, Belkys A, MD  furosemide (LASIX) 20 MG tablet Take 1 tablet (20 mg total) by mouth 2 (two) times daily. Patient taking differently: Take 10 mg by mouth See admin instructions. Give 1/2 tablet by mouth one time of day every three days  for fluid mgmt 02/09/21   Regalado, Belkys A, MD  insulin lispro (HUMALOG) 100 UNIT/ML injection Inject 2-14 Units into the skin daily. Per sliding scale: if 200 units-250= 2 units, 251-300= 4 units, 301-350= 6 units, 351-400= 8 units, 401-450= 10 units, 451-500= 12 units. If glucometer reads High give 14 units    [provider]  loperamide (IMODIUM) 2 MG capsule Take 2 mg by mouth every 8 (eight) hours as needed for diarrhea or loose stools.    [provider]  magnesium oxide (MAG-OX) 400 MG tablet Take 400 mg by mouth daily.    [provider]  metFORMIN (GLUCOPHAGE-XR) 750 MG 24 hr tablet Take 750 mg by mouth 2 (two) times daily.    [provider]  Nutritional Supplements (RESOURCE 2.0) LIQD Take 120 mLs by mouth 3 (three) times daily.    [provider]  omeprazole (PRILOSEC OTC) 20 MG tablet Take 20 mg by mouth See admin instructions. Qd x 2 months for anemia    [provider]  OXYGEN Inhale 2 L into the lungs continuous.    [provider]  Pollen Extracts (PROSTAT PO) Take 30 mLs by mouth 2 (two) times daily. for wound healing    [provider]  pravastatin (PRAVACHOL) 20 MG tablet Take 20 mg by mouth daily. 12/20/19   [provider]  sodium bicarbonate 650 MG tablet Take 2  tablets (1,300 mg total) by mouth 3 (three) times daily. Patient taking differently: Take 650 mg by mouth 3 (three) times daily. 02/09/21   Regalado, Belkys A, MD  sodium zirconium cyclosilicate (LOKELMA) 5 g packet Take 5 g by mouth every other day.    [provider]      Allergies    Baclofen and Lisinopril    Review of Systems   Review of Systems  Skin:  Positive for wound.  All other systems reviewed and are negative.   Physical Exam Updated Vital Signs BP (!) 129/91 (BP Location: Left Arm)   Pulse 83   Temp 97.8 F (36.6 C) (Oral)   Resp 14   Ht 6' (1.829 m)   Wt 81.6 kg   SpO2 100%   BMI 24.41 kg/m   Physical Exam Vitals and nursing note reviewed.  Constitutional:      Comments: Chronic ill and patient is very skinny  HENT:     Head: Normocephalic.     Nose: Nose normal.     Mouth/Throat:     Mouth: Mucous membranes are moist.  Eyes:     Extraocular Movements: Extraocular movements intact.     Pupils: Pupils are equal, round, and reactive to light.  Cardiovascular:     Rate and Rhythm: Normal rate and regular rhythm.     Pulses: Normal pulses.     Heart sounds: Normal heart sounds.  Pulmonary:     Effort: Pulmonary effort is normal.     Breath sounds: Normal breath sounds.  Abdominal:     General: Abdomen is flat.     Palpations: Abdomen is soft.  Musculoskeletal:     Cervical back: Normal range of motion and neck supple.     Comments: Patient has known stage II sacral decub ulcer.  Patient's bilateral legs are wrapped  Neurological:     General: No focal deficit present.  Psychiatric:        Mood and Affect: Mood normal.    ED Results / Procedures / Treatments   Labs (all labs ordered are listed, but only abnormal results are displayed) Labs Reviewed  COMPREHENSIVE METABOLIC PANEL - Abnormal; Notable for the following components:      Result Value   Sodium 132 (*)    Potassium 6.0 (*)    CO2 17 (*)    Glucose, Bld 157 (*)    BUN 28 (*)    Calcium 8.4 (*)    Albumin 3.1 (*)    Anion gap 4 (*)    All other components within normal limits  I-STAT CHEM 8, ED - Abnormal; Notable for the following components:   Potassium 6.0 (*)    Chloride 112 (*)    BUN 26 (*)    Glucose, Bld 136 (*)    TCO2 17 (*)    Hemoglobin 10.9 (*)    HCT 32.0 (*)    All other components within normal limits  CBC WITH DIFFERENTIAL/PLATELET  CBC WITH DIFFERENTIAL/PLATELET  BLOOD GAS, ARTERIAL    EKG None  Radiology No results found.  Procedures Procedures    Medications Ordered in ED Medications  sodium zirconium cyclosilicate (LOKELMA) packet 10 g (has no administration  in time range)  oxyCODONE-acetaminophen (PERCOCET/ROXICET) 5-325 MG per tablet 2 tablet (2 tablets Oral Given 12/26/22 1854)    ED Course/ Medical Decision Making/ A&P  Medical Decision Making [name] [name] is a 77 y.o. adult here presenting with hyperkalemia.  Patient is difficult IV stick so I was able to get an ultrasound-guided ABG and initial CMP showed potassium of 6.0.  There is no visible hemolysis and creatinine is normal at 0.8.  Discussed case with Dr. Vivia Birmingham from nephrology.  She recommend repeat sample with i-STAT.  10:00 PM I repeated i-STAT Chem-8 and potassium is 6.0.  Creatinine remains normal.  I discussed with her again.  She states that she recommend Lokelma 10 mg twice daily for 2 days.  Patient does not have any EKG changes has no symptoms.  She also states that if patient is on Ensure, that can cause hyperkalemia and recommend stopping Ensure.  She does not think that patient needs to be admitted at this point.  I discussed with patient's power of attorney, his daughter [name] [name].  She states that she takes care of him at home.  I have ordered Lokelma and patient can follow-up with PCP on Monday to get a repeat potassium level checked.   Problems Addressed: Hyperkalemia: acute illness or injury  Amount and/or Complexity of Data Reviewed Labs: ordered. Decision-making details documented in ED Course. ECG/medicine tests: ordered and independent interpretation performed. Decision-making details documented in ED Course.  Risk Prescription drug management.    Final Clinical Impression(s) / ED Diagnoses Final diagnoses:  None    Rx / DC Orders ED Discharge Orders     None         Charlynne Pander, MD 12/26/22 2201    Charlynne Pander, MD 12/26/22 2210

## 2022-12-31 ENCOUNTER — Encounter (HOSPITAL_BASED_OUTPATIENT_CLINIC_OR_DEPARTMENT_OTHER): Payer: Medicare HMO | Attending: General Surgery | Admitting: General Surgery

## 2023-03-26 ENCOUNTER — Encounter (HOSPITAL_BASED_OUTPATIENT_CLINIC_OR_DEPARTMENT_OTHER): Payer: Medicare HMO | Admitting: General Surgery

## 2023-05-09 ENCOUNTER — Other Ambulatory Visit: Payer: Self-pay

## 2023-05-09 ENCOUNTER — Emergency Department (HOSPITAL_COMMUNITY)
Admission: EM | Admit: 2023-05-09 | Discharge: 2023-05-10 | Disposition: A | Discharge status: Home / Self Care | Attending: Emergency Medicine | Admitting: Emergency Medicine

## 2023-05-09 ENCOUNTER — Encounter (HOSPITAL_COMMUNITY): Payer: Self-pay

## 2023-05-09 DIAGNOSIS — Z794 Long term (current) use of insulin: Secondary | ICD-10-CM | POA: Insufficient documentation

## 2023-05-09 DIAGNOSIS — Z7982 Long term (current) use of aspirin: Secondary | ICD-10-CM | POA: Diagnosis not present

## 2023-05-09 DIAGNOSIS — E875 Hyperkalemia: Secondary | ICD-10-CM | POA: Insufficient documentation

## 2023-05-09 DIAGNOSIS — Z7984 Long term (current) use of oral hypoglycemic drugs: Secondary | ICD-10-CM | POA: Diagnosis not present

## 2023-05-09 DIAGNOSIS — I10 Essential (primary) hypertension: Secondary | ICD-10-CM | POA: Diagnosis not present

## 2023-05-09 DIAGNOSIS — R899 Unspecified abnormal finding in specimens from other organs, systems and tissues: Secondary | ICD-10-CM

## 2023-05-09 DIAGNOSIS — E119 Type 2 diabetes mellitus without complications: Secondary | ICD-10-CM | POA: Insufficient documentation

## 2023-05-09 DIAGNOSIS — R7989 Other specified abnormal findings of blood chemistry: Secondary | ICD-10-CM | POA: Diagnosis present

## 2023-05-09 LAB — CBC
HCT: 33.3 % — ABNORMAL LOW (ref 39.0–52.0)
Hemoglobin: 10.6 g/dL — ABNORMAL LOW (ref 13.0–17.0)
MCH: 32 pg (ref 26.0–34.0)
MCHC: 31.8 g/dL (ref 30.0–36.0)
MCV: 100.6 fL — ABNORMAL HIGH (ref 80.0–100.0)
Platelets: 271 10*3/uL (ref 150–400)
RBC: 3.31 MIL/uL — ABNORMAL LOW (ref 4.22–5.81)
RDW: 13.2 % (ref 11.5–15.5)
WBC: 7.5 10*3/uL (ref 4.0–10.5)
nRBC: 0 % (ref 0.0–0.2)

## 2023-05-09 NOTE — ED Notes (Signed)
 Phlebotomy team, this ED RN attempted IV x2, ED RN Gregory Leash attempted IV unsuccessful, and MD Osta attempted US  IV, Unable to obtain blood work at this time. IV team consult ordered.

## 2023-05-09 NOTE — Discharge Instructions (Addendum)
 You were seen in the ED today for repeat laboratory studies with concern for high potassium. Your potassium level was slightly high today at 5.6.   Please talk to your doctor about continuing your losartan  (blood pressure medication).  Please follow up with your doctor for recheck.

## 2023-05-09 NOTE — ED Provider Notes (Signed)
 [place] EMERGENCY DEPARTMENT AT [facility] Provider Note   CSN: [id number] Arrival date & time: 05/09/23  1849     History  No chief complaint on file.  HPI  [name] [name] is a 78 y.o. adult with past medical history type 2 diabetes, quadriplegia, hyperlipidemia, hypertension, pressure ulcers, prior admissions for hyperkalemia presents due to concern for hyperkalemia.  Patient had routine outpatient visit with his PCP today where he had blood work done.  Lab workup resulted with potassium of 6.3, slightly hemolyzed.  He was told to present here for further evaluation.  He denies any chest pain or palpitations.  Denies any shortness of breath, fevers, abdominal pain, back pain, dysuria.  HPI     Home Medications Prior to Admission medications   Medication Sig Start Date End Date Taking? Authorizing Provider  acetaminophen  (TYLENOL ) 500 MG tablet Take 1,000 mg by mouth in the morning and at bedtime.    [provider]  aspirin  EC 81 MG EC tablet Take 1 tablet (81 mg total) by mouth daily. Swallow whole. 01/25/21   Singh, Prashant K, MD  B COMPLEX-BIOTIN-FA PO Take 1 tablet by mouth daily.    [provider]  Chlorhexidine  Gluconate (HIBICLENS  EX) Apply 1 application topically See admin instructions. Cleanse skin scabs and lesions every 72 hours    [provider]  famotidine  (PEPCID ) 20 MG tablet Take 20 mg by mouth daily.    [provider]  ferrous gluconate  (FERGON) 324 MG tablet Take 324 mg by mouth in the morning and at bedtime.    [provider]  fludrocortisone  (FLORINEF ) 0.1 MG tablet Take 0.5 tablets (0.05 mg total) by mouth daily. 02/09/21   Regalado, Belkys A, MD  furosemide  (LASIX ) 20 MG tablet Take 1 tablet (20 mg total) by mouth 2 (two) times daily. Patient taking differently: Take 10 mg by mouth See admin instructions. Give 1/2 tablet by mouth one time of day every three days for fluid mgmt 02/09/21   Regalado,  Belkys A, MD  insulin  lispro (HUMALOG) 100 UNIT/ML injection Inject 2-14 Units into the skin daily. Per sliding scale: if 200 units-250= 2 units, 251-300= 4 units, 301-350= 6 units, 351-400= 8 units, 401-450= 10 units, 451-500= 12 units. If glucometer reads High give 14 units    [provider]  loperamide (IMODIUM) 2 MG capsule Take 2 mg by mouth every 8 (eight) hours as needed for diarrhea or loose stools.    [provider]  magnesium  oxide (MAG-OX) 400 MG tablet Take 400 mg by mouth daily.    [provider]  metFORMIN  (GLUCOPHAGE -XR) 750 MG 24 hr tablet Take 750 mg by mouth 2 (two) times daily.    [provider]  Nutritional Supplements (RESOURCE 2.0) LIQD Take 120 mLs by mouth 3 (three) times daily.    [provider]  omeprazole  (PRILOSEC  OTC) 20 MG tablet Take 20 mg by mouth See admin instructions. Qd x 2 months for anemia    [provider]  OXYGEN Inhale 2 L into the lungs continuous.    [provider]  Pollen Extracts (PROSTAT PO) Take 30 mLs by mouth 2 (two) times daily. for wound healing    [provider]  pravastatin  (PRAVACHOL ) 20 MG tablet Take 20 mg by mouth daily. 12/20/19   [provider]  sodium bicarbonate  650 MG tablet Take 2 tablets (1,300 mg total) by mouth 3 (three) times daily. Patient taking differently: Take 650 mg by mouth  3 (three) times daily. 02/09/21   Regalado, Belkys A, MD  sodium zirconium cyclosilicate  (LOKELMA ) 10 g PACK packet Take 10 g by mouth 2 (two) times daily. 12/26/22   Dalene Duck, MD      Allergies    Baclofen and Lisinopril     Review of Systems   Review of Systems  Physical Exam Updated Vital Signs BP 135/89   Pulse 94   Temp 97.9 F (36.6 C)   Resp 17   Ht 6' (1.829 m)   Wt 83.9 kg   SpO2 100%   BMI 25.09 kg/m  Physical Exam Vitals and nursing note reviewed.  Constitutional:      General: She is not in acute distress.    Appearance: She is  well-developed.  HENT:     Head: Normocephalic and atraumatic.     Nose: Nose normal.  Eyes:     Conjunctiva/sclera: Conjunctivae normal.  Cardiovascular:     Rate and Rhythm: Normal rate and regular rhythm.     Pulses:          Radial pulses are 2+ on the left side.     Heart sounds: No murmur heard. Pulmonary:     Effort: Pulmonary effort is normal. No respiratory distress.     Breath sounds: Normal breath sounds. No wheezing.  Abdominal:     Palpations: Abdomen is soft.     Tenderness: There is no abdominal tenderness. There is no guarding or rebound.  Musculoskeletal:        General: No swelling.     Cervical back: Neck supple.     Comments: Right upper extremity amputation  Skin:    General: Skin is warm and dry.     Capillary Refill: Capillary refill takes less than 2 seconds.     Comments: Bilateral lower extremities wrapped in Kerlix  Neurological:     Mental Status: She is alert.  Psychiatric:        Mood and Affect: Mood normal.     ED Results / Procedures / Treatments   Labs (all labs ordered are listed, but only abnormal results are displayed) Labs Reviewed  COMPREHENSIVE METABOLIC PANEL WITH GFR  CBC  TROPONIN I (HIGH SENSITIVITY)    EKG None  Radiology No results found.  Procedures Procedures    Medications Ordered in ED Medications - No data to display  ED Course/ Medical Decision Making/ A&P                                 Medical Decision Making Amount and/or Complexity of Data Reviewed Labs: ordered.   Patient is alert, afebrile, and hemodynamically stable in no acute distress.  Physical exam as noted above.  Will repeat CMP here to assess for any hyperkalemia though a component of elevation could be due to hemolysis.  Will also obtain EKG to check for any hyperkalemic changes.  I personally interpreted patient's EKG, which demonstrated sinus rhythm with no hyperkalemic changes, no peaked T waves or widened QRS, no flattened P or T  waves.  Unfortunately, there was difficulty obtaining IV access with blood and ultrasound-guided, requiring IV team to perform this.  At the time of handoff, labs are still pending.  Patient is on telemetry for continued monitoring. Handoff given to Dr. Monique Ano.  Patient seen in conjunction with Dr. Manus Sellers, who agreed with the above work-up and plan of care.  Final Clinical Impression(s) / ED Diagnoses Final diagnoses:  None    Rx / DC Orders ED Discharge Orders     None         Lorain Robson, MD 05/09/23 2359    Tegeler, Marine Sia, MD 05/10/23 0005

## 2023-05-09 NOTE — ED Notes (Signed)
 Attempted blood draw unsuccessful attempt

## 2023-05-09 NOTE — ED Provider Notes (Signed)
 Here for hyperkalemia on outpatient labs.

## 2023-05-09 NOTE — ED Triage Notes (Signed)
 Pt BIB [facility] from home d/t his PCP reporting that his lab work showed a potassium of 6.5. Pt A/Ox4, 160/97, 90 bpm, 98% on RA, Hx of DM, CBG 200. 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

## 2023-05-10 DIAGNOSIS — E875 Hyperkalemia: Secondary | ICD-10-CM | POA: Diagnosis not present

## 2023-05-10 LAB — COMPREHENSIVE METABOLIC PANEL WITH GFR
ALT: 10 U/L (ref 0–44)
AST: 14 U/L — ABNORMAL LOW (ref 15–41)
Albumin: 2.8 g/dL — ABNORMAL LOW (ref 3.5–5.0)
Alkaline Phosphatase: 97 U/L (ref 38–126)
Anion gap: 5 (ref 5–15)
BUN: 23 mg/dL (ref 8–23)
CO2: 19 mmol/L — ABNORMAL LOW (ref 22–32)
Calcium: 8.7 mg/dL — ABNORMAL LOW (ref 8.9–10.3)
Chloride: 110 mmol/L (ref 98–111)
Creatinine, Ser: 0.84 mg/dL (ref 0.61–1.24)
GFR, Estimated: >60 mL/min (ref >60)
Glucose, Bld: 164 mg/dL — ABNORMAL HIGH (ref 70–99)
Potassium: 5.6 mmol/L — ABNORMAL HIGH (ref 3.5–5.1)
Sodium: 134 mmol/L — ABNORMAL LOW (ref 135–145)
Total Bilirubin: 0.5 mg/dL (ref 0.0–1.2)
Total Protein: 6.7 g/dL (ref 6.5–8.1)

## 2023-05-10 MED ORDER — SODIUM ZIRCONIUM CYCLOSILICATE 10 G PO PACK
10.0000 g | PACK | Freq: Once | ORAL | Status: AC
  Administered 2023-05-10: 10 g via ORAL
  Filled 2023-05-10: qty 1

## 2023-05-10 MED ORDER — LOKELMA 10 G PO PACK: 10.0000 g | PACK | Freq: Once | ORAL | 0 refills | Status: AC

## 2023-05-10 NOTE — ED Notes (Signed)
 [facility] called for transport back to address on file

## 2023-05-14 ENCOUNTER — Encounter (HOSPITAL_BASED_OUTPATIENT_CLINIC_OR_DEPARTMENT_OTHER): Attending: General Surgery | Admitting: General Surgery

## 2023-05-14 DIAGNOSIS — L98492 Non-pressure chronic ulcer of skin of other sites with fat layer exposed: Secondary | ICD-10-CM | POA: Insufficient documentation

## 2023-05-14 DIAGNOSIS — L97812 Non-pressure chronic ulcer of other part of right lower leg with fat layer exposed: Secondary | ICD-10-CM | POA: Insufficient documentation

## 2023-05-14 DIAGNOSIS — Z7984 Long term (current) use of oral hypoglycemic drugs: Secondary | ICD-10-CM | POA: Insufficient documentation

## 2023-05-14 DIAGNOSIS — L97822 Non-pressure chronic ulcer of other part of left lower leg with fat layer exposed: Secondary | ICD-10-CM | POA: Diagnosis not present

## 2023-05-14 DIAGNOSIS — E11622 Type 2 diabetes mellitus with other skin ulcer: Secondary | ICD-10-CM | POA: Diagnosis not present

## 2023-05-14 DIAGNOSIS — L89013 Pressure ulcer of right elbow, stage 3: Secondary | ICD-10-CM | POA: Diagnosis present

## 2023-05-14 DIAGNOSIS — G8220 Paraplegia, unspecified: Secondary | ICD-10-CM | POA: Diagnosis not present

## 2023-05-29 ENCOUNTER — Encounter (HOSPITAL_BASED_OUTPATIENT_CLINIC_OR_DEPARTMENT_OTHER): Attending: Internal Medicine | Admitting: Internal Medicine

## 2023-05-29 DIAGNOSIS — L89013 Pressure ulcer of right elbow, stage 3: Secondary | ICD-10-CM | POA: Insufficient documentation

## 2023-05-29 DIAGNOSIS — G8220 Paraplegia, unspecified: Secondary | ICD-10-CM | POA: Insufficient documentation

## 2023-05-29 DIAGNOSIS — L97812 Non-pressure chronic ulcer of other part of right lower leg with fat layer exposed: Secondary | ICD-10-CM | POA: Insufficient documentation

## 2023-05-29 DIAGNOSIS — L97822 Non-pressure chronic ulcer of other part of left lower leg with fat layer exposed: Secondary | ICD-10-CM | POA: Diagnosis not present

## 2023-05-29 DIAGNOSIS — L139 Bullous disorder, unspecified: Secondary | ICD-10-CM | POA: Diagnosis not present

## 2023-05-29 DIAGNOSIS — E11622 Type 2 diabetes mellitus with other skin ulcer: Secondary | ICD-10-CM | POA: Insufficient documentation

## 2023-05-29 DIAGNOSIS — L98492 Non-pressure chronic ulcer of skin of other sites with fat layer exposed: Secondary | ICD-10-CM | POA: Diagnosis not present

## 2023-06-26 ENCOUNTER — Encounter (HOSPITAL_BASED_OUTPATIENT_CLINIC_OR_DEPARTMENT_OTHER): Attending: General Surgery | Admitting: General Surgery

## 2023-06-26 DIAGNOSIS — Z7984 Long term (current) use of oral hypoglycemic drugs: Secondary | ICD-10-CM | POA: Diagnosis not present

## 2023-06-26 DIAGNOSIS — E119 Type 2 diabetes mellitus without complications: Secondary | ICD-10-CM | POA: Insufficient documentation

## 2023-06-26 DIAGNOSIS — G8220 Paraplegia, unspecified: Secondary | ICD-10-CM | POA: Insufficient documentation

## 2023-06-26 DIAGNOSIS — L89013 Pressure ulcer of right elbow, stage 3: Secondary | ICD-10-CM | POA: Insufficient documentation

## 2023-06-26 DIAGNOSIS — Z89111 Acquired absence of right hand: Secondary | ICD-10-CM | POA: Diagnosis not present

## 2023-06-26 DIAGNOSIS — L139 Bullous disorder, unspecified: Secondary | ICD-10-CM | POA: Insufficient documentation

## 2023-07-23 ENCOUNTER — Encounter (HOSPITAL_BASED_OUTPATIENT_CLINIC_OR_DEPARTMENT_OTHER): Attending: General Surgery | Admitting: General Surgery

## 2023-07-23 DIAGNOSIS — L97812 Non-pressure chronic ulcer of other part of right lower leg with fat layer exposed: Secondary | ICD-10-CM | POA: Insufficient documentation

## 2023-07-23 DIAGNOSIS — L139 Bullous disorder, unspecified: Secondary | ICD-10-CM | POA: Insufficient documentation

## 2023-07-23 DIAGNOSIS — L89154 Pressure ulcer of sacral region, stage 4: Secondary | ICD-10-CM | POA: Insufficient documentation

## 2023-07-23 DIAGNOSIS — L89013 Pressure ulcer of right elbow, stage 3: Secondary | ICD-10-CM | POA: Insufficient documentation

## 2023-07-23 DIAGNOSIS — E11622 Type 2 diabetes mellitus with other skin ulcer: Secondary | ICD-10-CM | POA: Diagnosis not present

## 2023-07-23 DIAGNOSIS — L98492 Non-pressure chronic ulcer of skin of other sites with fat layer exposed: Secondary | ICD-10-CM | POA: Diagnosis not present

## 2023-07-23 DIAGNOSIS — L89624 Pressure ulcer of left heel, stage 4: Secondary | ICD-10-CM | POA: Insufficient documentation

## 2023-07-23 DIAGNOSIS — G8220 Paraplegia, unspecified: Secondary | ICD-10-CM | POA: Insufficient documentation

## 2023-08-20 ENCOUNTER — Encounter (HOSPITAL_BASED_OUTPATIENT_CLINIC_OR_DEPARTMENT_OTHER): Attending: General Surgery | Admitting: General Surgery

## 2023-08-20 DIAGNOSIS — L89154 Pressure ulcer of sacral region, stage 4: Secondary | ICD-10-CM | POA: Insufficient documentation

## 2023-08-20 DIAGNOSIS — E11622 Type 2 diabetes mellitus with other skin ulcer: Secondary | ICD-10-CM | POA: Insufficient documentation

## 2023-08-20 DIAGNOSIS — L97812 Non-pressure chronic ulcer of other part of right lower leg with fat layer exposed: Secondary | ICD-10-CM | POA: Diagnosis not present

## 2023-08-20 DIAGNOSIS — L89013 Pressure ulcer of right elbow, stage 3: Secondary | ICD-10-CM | POA: Insufficient documentation

## 2023-08-20 DIAGNOSIS — L89624 Pressure ulcer of left heel, stage 4: Secondary | ICD-10-CM | POA: Diagnosis not present

## 2023-08-20 DIAGNOSIS — G8220 Paraplegia, unspecified: Secondary | ICD-10-CM | POA: Diagnosis not present

## 2023-08-20 DIAGNOSIS — L98492 Non-pressure chronic ulcer of skin of other sites with fat layer exposed: Secondary | ICD-10-CM | POA: Insufficient documentation

## 2023-08-20 DIAGNOSIS — Z7984 Long term (current) use of oral hypoglycemic drugs: Secondary | ICD-10-CM | POA: Insufficient documentation

## 2023-08-20 DIAGNOSIS — L139 Bullous disorder, unspecified: Secondary | ICD-10-CM | POA: Diagnosis not present

## 2023-09-17 ENCOUNTER — Encounter (HOSPITAL_BASED_OUTPATIENT_CLINIC_OR_DEPARTMENT_OTHER): Admitting: General Surgery

## 2023-09-23 ENCOUNTER — Ambulatory Visit (HOSPITAL_BASED_OUTPATIENT_CLINIC_OR_DEPARTMENT_OTHER): Admitting: General Surgery

## 2023-09-30 ENCOUNTER — Encounter (HOSPITAL_BASED_OUTPATIENT_CLINIC_OR_DEPARTMENT_OTHER): Attending: General Surgery | Admitting: General Surgery

## 2023-09-30 DIAGNOSIS — E11622 Type 2 diabetes mellitus with other skin ulcer: Secondary | ICD-10-CM | POA: Diagnosis not present

## 2023-09-30 DIAGNOSIS — Z7984 Long term (current) use of oral hypoglycemic drugs: Secondary | ICD-10-CM | POA: Insufficient documentation

## 2023-09-30 DIAGNOSIS — L89624 Pressure ulcer of left heel, stage 4: Secondary | ICD-10-CM | POA: Diagnosis not present

## 2023-09-30 DIAGNOSIS — L89013 Pressure ulcer of right elbow, stage 3: Secondary | ICD-10-CM | POA: Insufficient documentation

## 2023-09-30 DIAGNOSIS — L89154 Pressure ulcer of sacral region, stage 4: Secondary | ICD-10-CM | POA: Insufficient documentation

## 2023-09-30 DIAGNOSIS — Z89211 Acquired absence of right upper limb below elbow: Secondary | ICD-10-CM | POA: Diagnosis not present

## 2023-09-30 DIAGNOSIS — G8220 Paraplegia, unspecified: Secondary | ICD-10-CM | POA: Diagnosis not present

## 2023-09-30 DIAGNOSIS — L97812 Non-pressure chronic ulcer of other part of right lower leg with fat layer exposed: Secondary | ICD-10-CM | POA: Insufficient documentation

## 2023-09-30 DIAGNOSIS — L139 Bullous disorder, unspecified: Secondary | ICD-10-CM | POA: Insufficient documentation

## 2023-10-07 NOTE — Progress Notes (Signed)
 [address] - AMBULATORY [id number] INTERNAL MEDICINE - [facility] [address] [place] [address]  10/07/23  Subjective   HPI:  [name] [name] is a 78 y.o. male who presents to the office today for interim follow-up.  Medical history significant for type II diabetes mellitus, hypertension, hyperlipidemia, Hyperkalemia, Hypomagnesemia, Vitamin D Deficiency, Chronic Disease/Iron  Deficiency Anemia, Elevated PSA, Sacral Pressure Ulcer, Muscular Wasting and Disuse Atrophy, Muscle Spasm, Contractures of Left Hand, Amputation of Right Hand, Malnutrition, Neurocognitive Disorder, Depression and Anxiety.  Accompanied by his daughter Travell Desaulniers.  She reports he has been refusing to take his medicine as prescribed.  Reports he has been following with a wound doctor.  States he experienced skin abrasions that seem to spread in the process of being removed and manipulated by EMS onto a stretcher.  His daughter reports there have been some intermittent screaming episodes from him over the last few months in the afternoon.  Review of Systems:  Review of Systems  Constitutional:  Negative for fatigue and fever.  Respiratory:  Negative for cough and shortness of breath.   Cardiovascular:  Negative for chest pain and palpitations.  Gastrointestinal:  Negative for constipation and diarrhea.     The following portions of the patient's history were reviewed and updated as appropriate: allergies, current medications, past family history, past medical history, past social history, past surgical history and problem list.  Past medical history: Medical History[1]   Medications:  Current Medications[2]   Allergies:  Allergies[3]   Social:  Social History   Socioeconomic History  . Marital status: Legally Separated    Spouse name: Not on file  . Number of children: Not on file  . Years of education: 12th Grade  . Highest education level: Not on file  Occupational History  . Not on file   Tobacco Use  . Smoking status: Never  . Smokeless tobacco: Never  Vaping Use  . Vaping status: Never Used  Substance and Sexual Activity  . Alcohol use: No  . Drug use: No  . Sexual activity: Not Currently  Other Topics Concern  . Not on file  Social History Narrative  . Not on file   Social Drivers of Health   Food Insecurity: Not on file  Transportation Needs: Not on file  Safety: Not on file  Living Situation: Not on file     Surgical:  Surgical History[4]      Objective   BP 93/64   Pulse 96   Temp 97.5 F (36.4 C) (Temporal)   SpO2 90%   Wt Readings from Last 3 Encounters:  05/09/23 74.8 kg (165 lb)  04/05/22 74.8 kg (165 lb)  12/28/20 70.3 kg (155 lb)    BP Readings from Last 3 Encounters:  10/07/23 93/64  05/09/23 114/76  12/19/22 145/77     Physical Exam:  Physical Exam Vitals reviewed.  Constitutional:      General: He is awake. He is not in acute distress.    Appearance: He is underweight.     Comments: Confined on EMS stretcher  HENT:     Head: Normocephalic and atraumatic.     Right Ear: Hearing normal.     Left Ear: Hearing normal.     Nose: Nose normal.     Mouth/Throat:     Lips: Pink.  Eyes:     General: Lids are normal.     Extraocular Movements: Extraocular movements intact.     Conjunctiva/sclera: Conjunctivae normal.  Pupils: Pupils are equal, round, and reactive to light.  Cardiovascular:     Rate and Rhythm: Normal rate and regular rhythm.  Pulmonary:     Effort: Pulmonary effort is normal.     Breath sounds: Normal breath sounds.  Abdominal:     General: Abdomen is flat. There is no distension.  Musculoskeletal:     Right lower leg: Edema present.     Left lower leg: Edema present.  Skin:    General: Skin is warm and dry.     Comments: Stuck on appearing plaques of the lower extremities  Neurological:     Mental Status: He is alert. Mental status is at baseline.  Psychiatric:        Attention and  Perception: Attention normal.        Mood and Affect: Mood normal.        Speech: Speech normal.      Relevant portions of the electronic records were reviewed. Recent and/or relevant labs and imaging are listed below.    Recent Labs:  CrCl cannot be calculated (Patient's most recent lab result is older than the maximum 10 days allowed.).  Lab Results  Component Value Date   WBC 5.40 05/09/2023   HGB 11.7 (L) 05/09/2023   HCT 36.2 (L) 05/09/2023   PLT 243 05/09/2023   CHOL 115 05/09/2023   TRIG 73 05/09/2023   HDL 33 (L) 05/09/2023   ALT 10 05/09/2023   AST 14 05/09/2023   NA 133 (L) 05/09/2023   K 6.3 (HH) 05/09/2023   CL 108 (H) 05/09/2023   CREATININE 0.96 05/09/2023   BUN 24 05/09/2023   CO2 18 (L) 05/09/2023   TSH 3.524 05/09/2023   PSA 4.90 (H) 04/14/2021   VITD 11.8 (L) 12/19/2022     Recent Imaging:  CT HEAD WO CONTRAST CLINICAL DATA:  Confusion and hallucinations.  EXAM: CT HEAD WITHOUT CONTRAST  TECHNIQUE: Contiguous axial images were obtained from the base of the skull through the vertex without intravenous contrast.  COMPARISON:  None.  FINDINGS: Brain: Mild atrophy. Frontal white matter hypodensities right greater than left most likely chronic ischemia. 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  Negative for acute infarct, hemorrhage, or mass.  No midline shift.  Vascular: Negative for hyperdense vessel  Skull: No acute skull abnormality. Metal BB in the left parietal scalp  Sinuses/Orbits: Negative  Other: None  IMPRESSION: White matter ischemia peers chronic. 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 No acute abnormality.  Electronically Signed   By: Carlin Gaskins M.D.   On: 06/04/2018 15:47    Assessment/Plan   1. Noncompliance with medications   2. Essential hypertension   3. Mixed hyperlipidemia   4. Type 2 diabetes mellitus with other skin complication, without long-term current use of insulin     (CMD)   5.  Contracture of left index finger   6. Amputation of right hand, sequela (CMD)   7. Muscular wasting and disuse atrophy   8. 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 Vitamin D deficiency   10. Neurocognitive deficits   11. Depressive disorder   12. Anxiety state   13. Hypomagnesemia     Plan Medication noncompliance -Denies any side effects from medications or inability to swallow -Reports taking medications as prescribed but per dispense history and his daughter this does not seem to case - Encouraged to take all medications as prescribed  2.  Hypertension - Borderline hypotensive today, prescribed losartan  50 mg  daily - Continue monitoring, recommend hold losartan   3.  Hyperlipidemia - Asymptomatic - Prescribed pravastatin  20 mg daily - Fasting lipids today  4.  Type 2 diabetes mellitus - Asymptomatic, prescribed metformin  XR 750 mg twice daily - A1c today - Will prescribe freestyle libre 3+ for glucose monitoring given absence of fingers of the right hand and absolute contracture of fingers of the left hand  5.  Muscular wasting and disuse - Stable, requires absolute ambulation assistance - Transported to appointments via EMS  6.  Sacral ulcer - Chronic, following with wound care  7.  Vitamin D deficiency - Prescribed weekly ergocalciferol, encouraged weekly compliance  8.  Neurocognitive deficits with anxiety and depression - Not prescribed relevant prescribed medications - Advised his daughter to continue monitoring mood changes  9.  Electrolyte disturbance - Monitor potassium and magnesium  today - Seemingly chronic hyperkalemia with hypomagnesium - Denies palpitations or CP at this time - Consider Lokelma  if no contraindication, remotely Rx'd Florinef   Medications Prescribed No orders of the defined types were placed in this encounter.   Other Orders Orders Placed This Encounter  Procedures  . Comprehensive Metabolic Panel  . Lipid Panel  .  Hemoglobin A1C With Estimated Average Glucose  . Magnesium       Patient expresses understanding of their current medications and use.  If a new prescription was given today, then I discussed potential side effects, drug interactions, instructions for taking the medication, and the consequences of not taking it.   Patient verbalized an understanding of these instructions. Patient is able to verbalize understanding of the care plan discussed today.   Follow up: 4 months Annual physical, Medicare, Recheck DM  Call sooner if needed.  Future Appointments  Date Time Provider Department Center  10/07/2023  1:15 PM [facility] [place] [facility] TECH 1 [facility] DRW [facility]  02/11/2024 11:20 AM Norman Search Gast, PA-C [facility] IM [facility] [address]   I agree that the documentation is accurate and complete.        [1] Past Medical History: Diagnosis Date  . Anemia   . Anxiety   . Depression   . Diabetes mellitus type II, controlled    (CMD)   . Hypertension   . Neuromuscular disease    (CMD)   . Varicella   [2]  Current Outpatient Medications:  .  aspirin  325 mg tablet, Take 325 mg by mouth Once Daily., Disp: , Rfl:  .  ergocalciferol (VITAMIN D2) 1,250 mcg (50,000 unit) capsule, Take 1 capsule (50,000 Units total) by mouth once a week., Disp: 12 capsule, Rfl: 1 .  ferrous sulfate  325 mg (65 mg iron ) EC tablet, Take 325 mg by mouth daily with breakfast., Disp: 90 tablet, Rfl: 1 .  fludrocortisone  (FLORINEF ) 0.1 mg tablet, Take 1 tablet (0.1 mg total) by mouth daily., Disp: 90 tablet, Rfl: 1 .  losartan  (COZAAR ) 50 mg tablet, Take 1 tablet (50 mg total) by mouth daily., Disp: 90 tablet, Rfl: 1 .  magnesium  oxide 400 mg (241 mg magnesium ) tab, Take 1 tablet (400 mg total) by mouth daily., Disp: 90 tablet, Rfl: 1 .  metFORMIN  (GLUCOPHAGE  XR) 750 mg 24 hr tablet, Take 1 tablet (750 mg total) by mouth 2 (two) times a day., Disp: 180 tablet, Rfl: 1 .  miscellaneous medical supply  misc, Use Abb pads 5x9 req. 48m netapour soft cloth surgical tape Swing motion  sorbagun Ag dressing Pari woundcare triamcinalone as  directed., Disp: 1 each, Rfl: 1 .  omeprazole  (PriLOSEC ) 20 mg DR capsule, TAKE 1 CAPSULE BY MOUTH EVERY DAY, Disp: 90 capsule, Rfl: 1 .  pravastatin  (PRAVACHOL ) 20 mg tablet, Take 1 tablet (20 mg total) by mouth daily., Disp: 90 tablet, Rfl: 1 [3] Allergies Allergen Reactions  . Baclofen Other (See Comments)    Left shoulder twitching/jerking, , Not documented on MAR  . Lisinopril  Other (See Comments)    Hyperkalemia.  [4] Past Surgical History: Procedure Laterality Date  . NECK SURGERY     Procedure: NECK SURGERY  . WRIST SURGERY Right 11/27/2017   Procedure: WRIST SURGERY; flex tendon repair

## 2023-10-23 ENCOUNTER — Emergency Department (HOSPITAL_COMMUNITY)

## 2023-10-23 ENCOUNTER — Other Ambulatory Visit: Payer: Self-pay

## 2023-10-23 ENCOUNTER — Inpatient Hospital Stay (HOSPITAL_COMMUNITY)
Admission: EM | Admit: 2023-10-23 | Discharge: 2023-12-03 | DRG: 871 | Disposition: A | Discharge status: Other Acute Inpatient Hospital | Attending: Internal Medicine | Admitting: Internal Medicine

## 2023-10-23 DIAGNOSIS — R532 Functional quadriplegia: Secondary | ICD-10-CM | POA: Diagnosis present

## 2023-10-23 DIAGNOSIS — A419 Sepsis, unspecified organism: Secondary | ICD-10-CM | POA: Diagnosis not present

## 2023-10-23 DIAGNOSIS — R195 Other fecal abnormalities: Secondary | ICD-10-CM

## 2023-10-23 DIAGNOSIS — G2401 Drug induced subacute dyskinesia: Secondary | ICD-10-CM | POA: Diagnosis not present

## 2023-10-23 DIAGNOSIS — D649 Anemia, unspecified: Secondary | ICD-10-CM | POA: Diagnosis present

## 2023-10-23 DIAGNOSIS — Z7401 Bed confinement status: Secondary | ICD-10-CM

## 2023-10-23 DIAGNOSIS — Z515 Encounter for palliative care: Secondary | ICD-10-CM

## 2023-10-23 DIAGNOSIS — E785 Hyperlipidemia, unspecified: Secondary | ICD-10-CM | POA: Diagnosis present

## 2023-10-23 DIAGNOSIS — Z1639 Resistance to other specified antimicrobial drug: Secondary | ICD-10-CM | POA: Diagnosis present

## 2023-10-23 DIAGNOSIS — E872 Acidosis, unspecified: Secondary | ICD-10-CM | POA: Diagnosis present

## 2023-10-23 DIAGNOSIS — E870 Hyperosmolality and hypernatremia: Secondary | ICD-10-CM | POA: Diagnosis not present

## 2023-10-23 DIAGNOSIS — M4628 Osteomyelitis of vertebra, sacral and sacrococcygeal region: Principal | ICD-10-CM

## 2023-10-23 DIAGNOSIS — D500 Iron deficiency anemia secondary to blood loss (chronic): Secondary | ICD-10-CM | POA: Diagnosis present

## 2023-10-23 DIAGNOSIS — S60512A Abrasion of left hand, initial encounter: Secondary | ICD-10-CM | POA: Diagnosis present

## 2023-10-23 DIAGNOSIS — E440 Moderate protein-calorie malnutrition: Secondary | ICD-10-CM | POA: Diagnosis present

## 2023-10-23 DIAGNOSIS — Z635 Disruption of family by separation and divorce: Secondary | ICD-10-CM

## 2023-10-23 DIAGNOSIS — E43 Unspecified severe protein-calorie malnutrition: Secondary | ICD-10-CM | POA: Insufficient documentation

## 2023-10-23 DIAGNOSIS — E11649 Type 2 diabetes mellitus with hypoglycemia without coma: Secondary | ICD-10-CM | POA: Diagnosis not present

## 2023-10-23 DIAGNOSIS — G8220 Paraplegia, unspecified: Secondary | ICD-10-CM | POA: Diagnosis present

## 2023-10-23 DIAGNOSIS — E875 Hyperkalemia: Secondary | ICD-10-CM | POA: Diagnosis present

## 2023-10-23 DIAGNOSIS — T83518A Infection and inflammatory reaction due to other urinary catheter, initial encounter: Secondary | ICD-10-CM | POA: Diagnosis present

## 2023-10-23 DIAGNOSIS — Z7982 Long term (current) use of aspirin: Secondary | ICD-10-CM

## 2023-10-23 DIAGNOSIS — G9341 Metabolic encephalopathy: Secondary | ICD-10-CM | POA: Diagnosis present

## 2023-10-23 DIAGNOSIS — I639 Cerebral infarction, unspecified: Secondary | ICD-10-CM

## 2023-10-23 DIAGNOSIS — Y846 Urinary catheterization as the cause of abnormal reaction of the patient, or of later complication, without mention of misadventure at the time of the procedure: Secondary | ICD-10-CM | POA: Diagnosis present

## 2023-10-23 DIAGNOSIS — L89890 Pressure ulcer of other site, unstageable: Secondary | ICD-10-CM | POA: Diagnosis present

## 2023-10-23 DIAGNOSIS — Z89111 Acquired absence of right hand: Secondary | ICD-10-CM

## 2023-10-23 DIAGNOSIS — L89153 Pressure ulcer of sacral region, stage 3: Secondary | ICD-10-CM | POA: Diagnosis present

## 2023-10-23 DIAGNOSIS — E1169 Type 2 diabetes mellitus with other specified complication: Secondary | ICD-10-CM | POA: Diagnosis present

## 2023-10-23 DIAGNOSIS — B029 Zoster without complications: Secondary | ICD-10-CM | POA: Diagnosis not present

## 2023-10-23 DIAGNOSIS — T8789 Other complications of amputation stump: Secondary | ICD-10-CM | POA: Diagnosis present

## 2023-10-23 DIAGNOSIS — E1165 Type 2 diabetes mellitus with hyperglycemia: Secondary | ICD-10-CM | POA: Diagnosis present

## 2023-10-23 DIAGNOSIS — L89624 Pressure ulcer of left heel, stage 4: Secondary | ICD-10-CM | POA: Diagnosis present

## 2023-10-23 DIAGNOSIS — L989 Disorder of the skin and subcutaneous tissue, unspecified: Secondary | ICD-10-CM | POA: Diagnosis present

## 2023-10-23 DIAGNOSIS — F0390 Unspecified dementia without behavioral disturbance: Secondary | ICD-10-CM | POA: Diagnosis present

## 2023-10-23 DIAGNOSIS — T50916A Underdosing of multiple unspecified drugs, medicaments and biological substances, initial encounter: Secondary | ICD-10-CM | POA: Diagnosis present

## 2023-10-23 DIAGNOSIS — K922 Gastrointestinal hemorrhage, unspecified: Secondary | ICD-10-CM | POA: Diagnosis not present

## 2023-10-23 DIAGNOSIS — R0902 Hypoxemia: Secondary | ICD-10-CM | POA: Diagnosis not present

## 2023-10-23 DIAGNOSIS — R471 Dysarthria and anarthria: Secondary | ICD-10-CM | POA: Diagnosis present

## 2023-10-23 DIAGNOSIS — B9689 Other specified bacterial agents as the cause of diseases classified elsewhere: Secondary | ICD-10-CM | POA: Diagnosis present

## 2023-10-23 DIAGNOSIS — R739 Hyperglycemia, unspecified: Secondary | ICD-10-CM

## 2023-10-23 DIAGNOSIS — R64 Cachexia: Secondary | ICD-10-CM | POA: Diagnosis present

## 2023-10-23 DIAGNOSIS — N390 Urinary tract infection, site not specified: Secondary | ICD-10-CM | POA: Diagnosis present

## 2023-10-23 DIAGNOSIS — Z87891 Personal history of nicotine dependence: Secondary | ICD-10-CM

## 2023-10-23 DIAGNOSIS — I10 Essential (primary) hypertension: Secondary | ICD-10-CM | POA: Diagnosis present

## 2023-10-23 DIAGNOSIS — T82524A Displacement of infusion catheter, initial encounter: Secondary | ICD-10-CM | POA: Diagnosis not present

## 2023-10-23 DIAGNOSIS — L89013 Pressure ulcer of right elbow, stage 3: Secondary | ICD-10-CM | POA: Diagnosis present

## 2023-10-23 DIAGNOSIS — L89154 Pressure ulcer of sacral region, stage 4: Secondary | ICD-10-CM

## 2023-10-23 DIAGNOSIS — Z91128 Patient's intentional underdosing of medication regimen for other reason: Secondary | ICD-10-CM

## 2023-10-23 DIAGNOSIS — Z6820 Body mass index (BMI) 20.0-20.9, adult: Secondary | ICD-10-CM

## 2023-10-23 DIAGNOSIS — S14109S Unspecified injury at unspecified level of cervical spinal cord, sequela: Secondary | ICD-10-CM

## 2023-10-23 DIAGNOSIS — M009 Pyogenic arthritis, unspecified: Secondary | ICD-10-CM | POA: Diagnosis present

## 2023-10-23 DIAGNOSIS — R29818 Other symptoms and signs involving the nervous system: Secondary | ICD-10-CM | POA: Diagnosis present

## 2023-10-23 DIAGNOSIS — Z7902 Long term (current) use of antithrombotics/antiplatelets: Secondary | ICD-10-CM

## 2023-10-23 DIAGNOSIS — R627 Adult failure to thrive: Secondary | ICD-10-CM | POA: Diagnosis present

## 2023-10-23 DIAGNOSIS — E860 Dehydration: Secondary | ICD-10-CM | POA: Diagnosis not present

## 2023-10-23 DIAGNOSIS — Z888 Allergy status to other drugs, medicaments and biological substances status: Secondary | ICD-10-CM

## 2023-10-23 DIAGNOSIS — L97319 Non-pressure chronic ulcer of right ankle with unspecified severity: Secondary | ICD-10-CM | POA: Diagnosis present

## 2023-10-23 DIAGNOSIS — E11628 Type 2 diabetes mellitus with other skin complications: Secondary | ICD-10-CM | POA: Diagnosis present

## 2023-10-23 DIAGNOSIS — Y712 Prosthetic and other implants, materials and accessory cardiovascular devices associated with adverse incidents: Secondary | ICD-10-CM | POA: Diagnosis not present

## 2023-10-23 DIAGNOSIS — Z833 Family history of diabetes mellitus: Secondary | ICD-10-CM

## 2023-10-23 DIAGNOSIS — L089 Local infection of the skin and subcutaneous tissue, unspecified: Secondary | ICD-10-CM | POA: Diagnosis present

## 2023-10-23 DIAGNOSIS — K5900 Constipation, unspecified: Secondary | ICD-10-CM | POA: Diagnosis not present

## 2023-10-23 DIAGNOSIS — L89310 Pressure ulcer of right buttock, unstageable: Secondary | ICD-10-CM | POA: Diagnosis present

## 2023-10-23 DIAGNOSIS — I6381 Other cerebral infarction due to occlusion or stenosis of small artery: Secondary | ICD-10-CM | POA: Diagnosis not present

## 2023-10-23 DIAGNOSIS — R1310 Dysphagia, unspecified: Secondary | ICD-10-CM | POA: Diagnosis present

## 2023-10-23 DIAGNOSIS — J690 Pneumonitis due to inhalation of food and vomit: Secondary | ICD-10-CM | POA: Diagnosis not present

## 2023-10-23 DIAGNOSIS — R6520 Severe sepsis without septic shock: Secondary | ICD-10-CM | POA: Diagnosis present

## 2023-10-23 DIAGNOSIS — E119 Type 2 diabetes mellitus without complications: Secondary | ICD-10-CM

## 2023-10-23 DIAGNOSIS — N2589 Other disorders resulting from impaired renal tubular function: Secondary | ICD-10-CM | POA: Diagnosis present

## 2023-10-23 DIAGNOSIS — Z1152 Encounter for screening for COVID-19: Secondary | ICD-10-CM

## 2023-10-23 DIAGNOSIS — Y838 Other surgical procedures as the cause of abnormal reaction of the patient, or of later complication, without mention of misadventure at the time of the procedure: Secondary | ICD-10-CM | POA: Diagnosis present

## 2023-10-23 DIAGNOSIS — Z794 Long term (current) use of insulin: Secondary | ICD-10-CM

## 2023-10-23 DIAGNOSIS — Z7952 Long term (current) use of systemic steroids: Secondary | ICD-10-CM

## 2023-10-23 DIAGNOSIS — D638 Anemia in other chronic diseases classified elsewhere: Secondary | ICD-10-CM | POA: Diagnosis present

## 2023-10-23 DIAGNOSIS — N19 Unspecified kidney failure: Secondary | ICD-10-CM

## 2023-10-23 DIAGNOSIS — L89896 Pressure-induced deep tissue damage of other site: Secondary | ICD-10-CM | POA: Diagnosis present

## 2023-10-23 DIAGNOSIS — D509 Iron deficiency anemia, unspecified: Secondary | ICD-10-CM | POA: Diagnosis present

## 2023-10-23 LAB — CBC WITH DIFFERENTIAL/PLATELET
Abs Immature Granulocytes: 0.17 K/uL — ABNORMAL HIGH (ref 0.00–0.07)
Basophils Absolute: 0 K/uL (ref 0.0–0.1)
Basophils Relative: 0 %
Eosinophils Absolute: 0 K/uL (ref 0.0–0.5)
Eosinophils Relative: 0 %
HCT: 25.5 % — ABNORMAL LOW (ref 39.0–52.0)
Hemoglobin: 8 g/dL — ABNORMAL LOW (ref 13.0–17.0)
Immature Granulocytes: 1 %
Lymphocytes Relative: 13 %
Lymphs Abs: 2.3 K/uL (ref 0.7–4.0)
MCH: 28.7 pg (ref 26.0–34.0)
MCHC: 31.4 g/dL (ref 30.0–36.0)
MCV: 91.4 fL (ref 80.0–100.0)
Monocytes Absolute: 1.5 K/uL — ABNORMAL HIGH (ref 0.1–1.0)
Monocytes Relative: 8 %
Neutro Abs: 14.2 K/uL — ABNORMAL HIGH (ref 1.7–7.7)
Neutrophils Relative %: 78 %
Platelets: 534 K/uL — ABNORMAL HIGH (ref 150–400)
RBC: 2.79 MIL/uL — ABNORMAL LOW (ref 4.22–5.81)
RDW: 14.9 % (ref 11.5–15.5)
WBC: 18.3 K/uL — ABNORMAL HIGH (ref 4.0–10.5)
nRBC: 0 % (ref 0.0–0.2)

## 2023-10-23 LAB — COMPREHENSIVE METABOLIC PANEL WITH GFR
ALT: 8 U/L (ref 0–44)
AST: 12 U/L — ABNORMAL LOW (ref 15–41)
Albumin: 1.6 g/dL — ABNORMAL LOW (ref 3.5–5.0)
Alkaline Phosphatase: 85 U/L (ref 38–126)
Anion gap: 9 (ref 5–15)
BUN: 84 mg/dL — ABNORMAL HIGH (ref 8–23)
CO2: 20 mmol/L — ABNORMAL LOW (ref 22–32)
Calcium: 8.3 mg/dL — ABNORMAL LOW (ref 8.9–10.3)
Chloride: 105 mmol/L (ref 98–111)
Creatinine, Ser: 0.91 mg/dL (ref 0.61–1.24)
GFR, Estimated: >60 mL/min (ref >60)
Glucose, Bld: 352 mg/dL — ABNORMAL HIGH (ref 70–99)
Potassium: 5.6 mmol/L — ABNORMAL HIGH (ref 3.5–5.1)
Sodium: 134 mmol/L — ABNORMAL LOW (ref 135–145)
Total Bilirubin: 0.4 mg/dL (ref 0.0–1.2)
Total Protein: 7 g/dL (ref 6.5–8.1)

## 2023-10-23 LAB — I-STAT CHEM 8, ED
BUN: 74 mg/dL — ABNORMAL HIGH (ref 8–23)
Calcium, Ion: 1.18 mmol/L (ref 1.15–1.40)
Chloride: 107 mmol/L (ref 98–111)
Creatinine, Ser: 1 mg/dL (ref 0.61–1.24)
Glucose, Bld: 364 mg/dL — ABNORMAL HIGH (ref 70–99)
HCT: 26 % — ABNORMAL LOW (ref 39.0–52.0)
Hemoglobin: 8.8 g/dL — ABNORMAL LOW (ref 13.0–17.0)
Potassium: 5.7 mmol/L — ABNORMAL HIGH (ref 3.5–5.1)
Sodium: 136 mmol/L (ref 135–145)
TCO2: 22 mmol/L (ref 22–32)

## 2023-10-23 LAB — I-STAT VENOUS BLOOD GAS, ED
Acid-base deficit: 3 mmol/L — ABNORMAL HIGH (ref 0.0–2.0)
Bicarbonate: 21.4 mmol/L (ref 20.0–28.0)
Calcium, Ion: 1.21 mmol/L (ref 1.15–1.40)
HCT: 25 % — ABNORMAL LOW (ref 39.0–52.0)
Hemoglobin: 8.5 g/dL — ABNORMAL LOW (ref 13.0–17.0)
O2 Saturation: 97 %
Potassium: 5.6 mmol/L — ABNORMAL HIGH (ref 3.5–5.1)
Sodium: 137 mmol/L (ref 135–145)
TCO2: 22 mmol/L (ref 22–32)
pCO2, Ven: 33.1 mmHg — ABNORMAL LOW (ref 44–60)
pH, Ven: 7.419 (ref 7.25–7.43)
pO2, Ven: 85 mmHg — ABNORMAL HIGH (ref 32–45)

## 2023-10-23 LAB — URINALYSIS, W/ REFLEX TO CULTURE (INFECTION SUSPECTED)
Bilirubin Urine: NEGATIVE
Glucose, UA: 50 mg/dL — AB
Hgb urine dipstick: NEGATIVE
Ketones, ur: NEGATIVE mg/dL
Nitrite: NEGATIVE
Protein, ur: 30 mg/dL — AB
Specific Gravity, Urine: 1.018 (ref 1.005–1.030)
WBC, UA: >50 WBC/hpf (ref 0–5)
pH: 5 (ref 5.0–8.0)

## 2023-10-23 LAB — SEDIMENTATION RATE: Sed Rate: 124 mm/h — ABNORMAL HIGH (ref 0–16)

## 2023-10-23 LAB — RESP PANEL BY RT-PCR (RSV, FLU A&B, COVID)  RVPGX2
Influenza A by PCR: NEGATIVE
Influenza B by PCR: NEGATIVE
Resp Syncytial Virus by PCR: NEGATIVE
SARS Coronavirus 2 by RT PCR: NEGATIVE

## 2023-10-23 LAB — PROTIME-INR
INR: 1.3 — ABNORMAL HIGH (ref 0.8–1.2)
Prothrombin Time: 16.7 s — ABNORMAL HIGH (ref 11.4–15.2)

## 2023-10-23 LAB — I-STAT CG4 LACTIC ACID, ED
Lactic Acid, Venous: 1.1 mmol/L (ref 0.5–1.9)
Lactic Acid, Venous: 1.7 mmol/L (ref 0.5–1.9)

## 2023-10-23 LAB — C-REACTIVE PROTEIN: CRP: 10.6 mg/dL — ABNORMAL HIGH (ref <1.0)

## 2023-10-23 LAB — POC OCCULT BLOOD, ED: Fecal Occult Blood, POC: POSITIVE — AB

## 2023-10-23 MED ORDER — SODIUM CHLORIDE 0.9 % IV BOLUS: 500.0000 mL | Freq: Once | INTRAVENOUS | Status: DC

## 2023-10-23 MED ORDER — ACETAMINOPHEN 650 MG RE SUPP
650.0000 mg | Freq: Once | RECTAL | Status: AC
  Administered 2023-10-23: 650 mg via RECTAL
  Filled 2023-10-23: qty 1

## 2023-10-23 MED ORDER — SODIUM CHLORIDE 0.9 % IV BOLUS
500.0000 mL | Freq: Once | INTRAVENOUS | Status: AC
  Administered 2023-10-23: 500 mL via INTRAVENOUS

## 2023-10-23 MED ORDER — VANCOMYCIN HCL IN DEXTROSE 1-5 GM/200ML-% IV SOLN
1000.0000 mg | Freq: Once | INTRAVENOUS | Status: AC
  Administered 2023-10-23: 1000 mg via INTRAVENOUS
  Filled 2023-10-23: qty 200

## 2023-10-23 MED ORDER — LACTATED RINGERS IV BOLUS (SEPSIS): 1000.0000 mL | Freq: Once | INTRAVENOUS | Status: DC

## 2023-10-23 MED ORDER — LACTATED RINGERS IV BOLUS (SEPSIS): 250.0000 mL | Freq: Once | INTRAVENOUS | Status: DC

## 2023-10-23 MED ORDER — METRONIDAZOLE 500 MG/100ML IV SOLN
500.0000 mg | Freq: Once | INTRAVENOUS | Status: AC
  Administered 2023-10-23: 500 mg via INTRAVENOUS
  Filled 2023-10-23: qty 100

## 2023-10-23 MED ORDER — LACTATED RINGERS IV BOLUS (SEPSIS)
1000.0000 mL | Freq: Once | INTRAVENOUS | Status: AC
  Administered 2023-10-23: 1000 mL via INTRAVENOUS

## 2023-10-23 MED ORDER — SODIUM CHLORIDE 0.9 % IV SOLN
2.0000 g | Freq: Once | INTRAVENOUS | Status: AC
  Administered 2023-10-23: 2 g via INTRAVENOUS
  Filled 2023-10-23: qty 12.5

## 2023-10-23 MED ORDER — IOHEXOL 350 MG/ML SOLN
75.0000 mL | Freq: Once | INTRAVENOUS | Status: AC | PRN
  Administered 2023-10-23: 75 mL via INTRAVENOUS

## 2023-10-23 MED ORDER — SODIUM CHLORIDE 0.9 % IV BOLUS
250.0000 mL | Freq: Once | INTRAVENOUS | Status: AC
  Administered 2023-10-23: 250 mL via INTRAVENOUS

## 2023-10-23 MED ORDER — IOHEXOL 350 MG/ML SOLN: 75.0000 mL | Freq: Once | INTRAVENOUS | Status: DC | PRN

## 2023-10-23 MED ORDER — LACTATED RINGERS IV SOLN: INTRAVENOUS | Status: AC

## 2023-10-23 MED ORDER — SODIUM CHLORIDE 0.9 % IV BOLUS
1000.0000 mL | Freq: Once | INTRAVENOUS | Status: AC
  Administered 2023-10-23: 1000 mL via INTRAVENOUS

## 2023-10-23 NOTE — ED Provider Notes (Signed)
 [place] EMERGENCY DEPARTMENT AT [facility] Provider Note   CSN: [id number] Arrival date & time: 10/23/23  1742     Patient presents with: Altered Mental Status   [name] [name] is a 78 y.o. adult.  {Add pertinent medical, surgical, social history, OB history to YEP:67052}  Altered Mental Status      Prior to Admission medications   Medication Sig Start Date End Date Taking? Authorizing Provider  aspirin  EC 325 MG tablet Take 325 mg by mouth in the morning.    [provider]  aspirin  EC 81 MG EC tablet Take 1 tablet (81 mg total) by mouth daily. Swallow whole. Patient not taking: Reported on 05/09/2023 01/25/21   Singh, Prashant K, MD  famotidine  (PEPCID ) 20 MG tablet Take 20 mg by mouth 2 (two) times daily.    [provider]  ferrous gluconate  (FERGON) 324 MG tablet Take 324 mg by mouth daily after supper.    [provider]  fludrocortisone  (FLORINEF ) 0.1 MG tablet Take 0.5 tablets (0.05 mg total) by mouth daily. Patient taking differently: Take 0.1 mg by mouth daily after supper. 02/09/21   Regalado, Belkys A, MD  furosemide  (LASIX ) 20 MG tablet Take 1 tablet (20 mg total) by mouth 2 (two) times daily. Patient not taking: Reported on 05/09/2023 02/09/21   Regalado, Owen A, MD  losartan  (COZAAR ) 50 MG tablet Take 50 mg by mouth every evening.    [provider]  magnesium  oxide (MAG-OX) 400 (240 Mg) MG tablet Take 400 mg by mouth every evening.    [provider]  metFORMIN  (GLUCOPHAGE -XR) 750 MG 24 hr tablet Take 750 mg by mouth 2 (two) times daily.    [provider]  omeprazole  (PRILOSEC ) 20 MG capsule Take 20 mg by mouth daily in the afternoon.    [provider]  pravastatin  (PRAVACHOL ) 20 MG tablet Take 20 mg by mouth every evening. 12/20/19   [provider]  sodium bicarbonate  650 MG tablet Take 2 tablets (1,300 mg total) by mouth 3 (three) times daily. Patient not taking: Reported on  05/09/2023 02/09/21   Madelyne Owen A, MD    Allergies: Baclofen and Lisinopril     Review of Systems  Updated Vital Signs BP 98/60 (BP Location: Left Arm)   Pulse (!) 115   Temp 100 F (37.8 C) (Rectal)   Resp (!) 22   Ht 6' (1.829 m)   Wt 68 kg   SpO2 100%   BMI 20.34 kg/m   Physical Exam  (all labs ordered are listed, but only abnormal results are displayed) Labs Reviewed - No data to display  EKG: None  Radiology: No results found.  {Document cardiac monitor, telemetry assessment procedure when appropriate:32947} Procedures   Medications Ordered in the ED - No data to display    {Click here for ABCD2, HEART and other calculators REFRESH Note before signing:1}                              Medical Decision Making  ***  {Document critical care time when appropriate  Document review of labs and clinical decision tools ie [id number], etc  Document your independent review of radiology images and any outside records  Document your discussion with family members, caretakers and with consultants  Document social determinants of health affecting pt's care  Document your decision making why or why not admission, treatments were needed:32947:::1}   Final diagnoses:  None    ED Discharge Orders     None

## 2023-10-23 NOTE — ED Notes (Addendum)
 1st lac of 1.05 resulted at 18:15 not crossing over into chart, 2nd not needed if Dr mirian to cancel

## 2023-10-23 NOTE — ED Triage Notes (Signed)
 PT BIB [facility] from home for AMS, PT aox4 at baseline but now only responding to pain. Pt has severe sacral wound, parapalegic with RFA amputation.   HR 130, 112/80 BP, 100% Spo2 RA, Co2 24, RR 22, CBG 400

## 2023-10-23 NOTE — Sepsis Progress Note (Signed)
 Elink will follow per sepsis protocol.

## 2023-10-24 ENCOUNTER — Encounter (HOSPITAL_COMMUNITY): Payer: Self-pay | Admitting: Internal Medicine

## 2023-10-24 ENCOUNTER — Inpatient Hospital Stay (HOSPITAL_COMMUNITY)

## 2023-10-24 DIAGNOSIS — R569 Unspecified convulsions: Secondary | ICD-10-CM | POA: Diagnosis not present

## 2023-10-24 DIAGNOSIS — R532 Functional quadriplegia: Secondary | ICD-10-CM | POA: Diagnosis not present

## 2023-10-24 DIAGNOSIS — D62 Acute posthemorrhagic anemia: Secondary | ICD-10-CM | POA: Diagnosis not present

## 2023-10-24 DIAGNOSIS — E870 Hyperosmolality and hypernatremia: Secondary | ICD-10-CM | POA: Diagnosis not present

## 2023-10-24 DIAGNOSIS — R4182 Altered mental status, unspecified: Secondary | ICD-10-CM | POA: Diagnosis not present

## 2023-10-24 DIAGNOSIS — R64 Cachexia: Secondary | ICD-10-CM | POA: Diagnosis present

## 2023-10-24 DIAGNOSIS — Y712 Prosthetic and other implants, materials and accessory cardiovascular devices associated with adverse incidents: Secondary | ICD-10-CM | POA: Diagnosis not present

## 2023-10-24 DIAGNOSIS — A419 Sepsis, unspecified organism: Secondary | ICD-10-CM

## 2023-10-24 DIAGNOSIS — D649 Anemia, unspecified: Secondary | ICD-10-CM | POA: Diagnosis not present

## 2023-10-24 DIAGNOSIS — L89013 Pressure ulcer of right elbow, stage 3: Secondary | ICD-10-CM | POA: Diagnosis present

## 2023-10-24 DIAGNOSIS — Y846 Urinary catheterization as the cause of abnormal reaction of the patient, or of later complication, without mention of misadventure at the time of the procedure: Secondary | ICD-10-CM | POA: Diagnosis present

## 2023-10-24 DIAGNOSIS — L089 Local infection of the skin and subcutaneous tissue, unspecified: Secondary | ICD-10-CM

## 2023-10-24 DIAGNOSIS — Z7189 Other specified counseling: Secondary | ICD-10-CM | POA: Diagnosis not present

## 2023-10-24 DIAGNOSIS — K922 Gastrointestinal hemorrhage, unspecified: Secondary | ICD-10-CM | POA: Diagnosis not present

## 2023-10-24 DIAGNOSIS — T361X5A Adverse effect of cephalosporins and other beta-lactam antibiotics, initial encounter: Secondary | ICD-10-CM | POA: Diagnosis not present

## 2023-10-24 DIAGNOSIS — L89154 Pressure ulcer of sacral region, stage 4: Secondary | ICD-10-CM | POA: Diagnosis present

## 2023-10-24 DIAGNOSIS — R6520 Severe sepsis without septic shock: Secondary | ICD-10-CM | POA: Diagnosis present

## 2023-10-24 DIAGNOSIS — L98429 Non-pressure chronic ulcer of back with unspecified severity: Secondary | ICD-10-CM | POA: Diagnosis not present

## 2023-10-24 DIAGNOSIS — Y838 Other surgical procedures as the cause of abnormal reaction of the patient, or of later complication, without mention of misadventure at the time of the procedure: Secondary | ICD-10-CM | POA: Diagnosis present

## 2023-10-24 DIAGNOSIS — L89159 Pressure ulcer of sacral region, unspecified stage: Secondary | ICD-10-CM | POA: Diagnosis not present

## 2023-10-24 DIAGNOSIS — Z515 Encounter for palliative care: Secondary | ICD-10-CM | POA: Diagnosis not present

## 2023-10-24 DIAGNOSIS — E119 Type 2 diabetes mellitus without complications: Secondary | ICD-10-CM

## 2023-10-24 DIAGNOSIS — I6340 Cerebral infarction due to embolism of unspecified cerebral artery: Secondary | ICD-10-CM | POA: Diagnosis not present

## 2023-10-24 DIAGNOSIS — R195 Other fecal abnormalities: Secondary | ICD-10-CM | POA: Diagnosis not present

## 2023-10-24 DIAGNOSIS — M4628 Osteomyelitis of vertebra, sacral and sacrococcygeal region: Principal | ICD-10-CM

## 2023-10-24 DIAGNOSIS — T83518A Infection and inflammatory reaction due to other urinary catheter, initial encounter: Secondary | ICD-10-CM | POA: Diagnosis present

## 2023-10-24 DIAGNOSIS — N390 Urinary tract infection, site not specified: Secondary | ICD-10-CM | POA: Diagnosis present

## 2023-10-24 DIAGNOSIS — N2589 Other disorders resulting from impaired renal tubular function: Secondary | ICD-10-CM

## 2023-10-24 DIAGNOSIS — L89624 Pressure ulcer of left heel, stage 4: Secondary | ICD-10-CM | POA: Diagnosis present

## 2023-10-24 DIAGNOSIS — E875 Hyperkalemia: Secondary | ICD-10-CM | POA: Diagnosis present

## 2023-10-24 DIAGNOSIS — I6381 Other cerebral infarction due to occlusion or stenosis of small artery: Secondary | ICD-10-CM | POA: Diagnosis not present

## 2023-10-24 DIAGNOSIS — R627 Adult failure to thrive: Secondary | ICD-10-CM | POA: Diagnosis not present

## 2023-10-24 DIAGNOSIS — J690 Pneumonitis due to inhalation of food and vomit: Secondary | ICD-10-CM | POA: Diagnosis not present

## 2023-10-24 DIAGNOSIS — E43 Unspecified severe protein-calorie malnutrition: Secondary | ICD-10-CM | POA: Diagnosis present

## 2023-10-24 DIAGNOSIS — I69391 Dysphagia following cerebral infarction: Secondary | ICD-10-CM | POA: Diagnosis not present

## 2023-10-24 DIAGNOSIS — I6389 Other cerebral infarction: Secondary | ICD-10-CM | POA: Diagnosis not present

## 2023-10-24 DIAGNOSIS — D72829 Elevated white blood cell count, unspecified: Secondary | ICD-10-CM | POA: Diagnosis not present

## 2023-10-24 DIAGNOSIS — I959 Hypotension, unspecified: Secondary | ICD-10-CM | POA: Diagnosis not present

## 2023-10-24 DIAGNOSIS — Z7902 Long term (current) use of antithrombotics/antiplatelets: Secondary | ICD-10-CM | POA: Diagnosis not present

## 2023-10-24 DIAGNOSIS — G8220 Paraplegia, unspecified: Secondary | ICD-10-CM

## 2023-10-24 DIAGNOSIS — E11628 Type 2 diabetes mellitus with other skin complications: Secondary | ICD-10-CM | POA: Diagnosis not present

## 2023-10-24 DIAGNOSIS — E440 Moderate protein-calorie malnutrition: Secondary | ICD-10-CM | POA: Diagnosis not present

## 2023-10-24 DIAGNOSIS — T83511D Infection and inflammatory reaction due to indwelling urethral catheter, subsequent encounter: Secondary | ICD-10-CM | POA: Diagnosis not present

## 2023-10-24 DIAGNOSIS — R251 Tremor, unspecified: Secondary | ICD-10-CM | POA: Diagnosis not present

## 2023-10-24 DIAGNOSIS — E1151 Type 2 diabetes mellitus with diabetic peripheral angiopathy without gangrene: Secondary | ICD-10-CM | POA: Diagnosis not present

## 2023-10-24 DIAGNOSIS — G9341 Metabolic encephalopathy: Secondary | ICD-10-CM | POA: Diagnosis present

## 2023-10-24 DIAGNOSIS — R638 Other symptoms and signs concerning food and fluid intake: Secondary | ICD-10-CM | POA: Diagnosis not present

## 2023-10-24 DIAGNOSIS — Z1152 Encounter for screening for COVID-19: Secondary | ICD-10-CM | POA: Diagnosis not present

## 2023-10-24 DIAGNOSIS — B9689 Other specified bacterial agents as the cause of diseases classified elsewhere: Secondary | ICD-10-CM | POA: Diagnosis present

## 2023-10-24 DIAGNOSIS — M009 Pyogenic arthritis, unspecified: Secondary | ICD-10-CM | POA: Diagnosis present

## 2023-10-24 DIAGNOSIS — Z66 Do not resuscitate: Secondary | ICD-10-CM | POA: Diagnosis not present

## 2023-10-24 DIAGNOSIS — D638 Anemia in other chronic diseases classified elsewhere: Secondary | ICD-10-CM | POA: Diagnosis present

## 2023-10-24 DIAGNOSIS — L97319 Non-pressure chronic ulcer of right ankle with unspecified severity: Secondary | ICD-10-CM | POA: Diagnosis present

## 2023-10-24 LAB — CBC WITH DIFFERENTIAL/PLATELET
Abs Immature Granulocytes: 0.09 K/uL — ABNORMAL HIGH (ref 0.00–0.07)
Basophils Absolute: 0 K/uL (ref 0.0–0.1)
Basophils Relative: 0 %
Eosinophils Absolute: 0.1 K/uL (ref 0.0–0.5)
Eosinophils Relative: 1 %
HCT: 24.4 % — ABNORMAL LOW (ref 39.0–52.0)
Hemoglobin: 7.1 g/dL — ABNORMAL LOW (ref 13.0–17.0)
Immature Granulocytes: 1 %
Lymphocytes Relative: 13 %
Lymphs Abs: 1.6 K/uL (ref 0.7–4.0)
MCH: 28.1 pg (ref 26.0–34.0)
MCHC: 29.1 g/dL — ABNORMAL LOW (ref 30.0–36.0)
MCV: 96.4 fL (ref 80.0–100.0)
Monocytes Absolute: 0.9 K/uL (ref 0.1–1.0)
Monocytes Relative: 7 %
Neutro Abs: 9.7 K/uL — ABNORMAL HIGH (ref 1.7–7.7)
Neutrophils Relative %: 78 %
Platelets: 376 K/uL (ref 150–400)
RBC: 2.53 MIL/uL — ABNORMAL LOW (ref 4.22–5.81)
RDW: 15.1 % (ref 11.5–15.5)
WBC: 12.4 K/uL — ABNORMAL HIGH (ref 4.0–10.5)
nRBC: 0 % (ref 0.0–0.2)

## 2023-10-24 LAB — CBG MONITORING, ED
Glucose-Capillary: 243 mg/dL — ABNORMAL HIGH (ref 70–99)
Glucose-Capillary: 163 mg/dL — ABNORMAL HIGH (ref 70–99)
Glucose-Capillary: 121 mg/dL — ABNORMAL HIGH (ref 70–99)
Glucose-Capillary: 82 mg/dL (ref 70–99)
Glucose-Capillary: 171 mg/dL — ABNORMAL HIGH (ref 70–99)

## 2023-10-24 LAB — BASIC METABOLIC PANEL WITH GFR
Anion gap: 9 (ref 5–15)
BUN: 61 mg/dL — ABNORMAL HIGH (ref 8–23)
CO2: 17 mmol/L — ABNORMAL LOW (ref 22–32)
Calcium: 7.6 mg/dL — ABNORMAL LOW (ref 8.9–10.3)
Chloride: 110 mmol/L (ref 98–111)
Creatinine, Ser: 0.69 mg/dL (ref 0.61–1.24)
GFR, Estimated: >60 mL/min (ref >60)
Glucose, Bld: 236 mg/dL — ABNORMAL HIGH (ref 70–99)
Potassium: 5.4 mmol/L — ABNORMAL HIGH (ref 3.5–5.1)
Sodium: 136 mmol/L (ref 135–145)

## 2023-10-24 LAB — HEMOGLOBIN A1C
Hgb A1c MFr Bld: 7.3 % — ABNORMAL HIGH (ref 4.8–5.6)
Mean Plasma Glucose: 162.81 mg/dL

## 2023-10-24 LAB — FERRITIN: Ferritin: 228 ng/mL (ref 24–336)

## 2023-10-24 LAB — HEPATIC FUNCTION PANEL
ALT: 6 U/L (ref 0–44)
AST: <10 U/L — ABNORMAL LOW (ref 15–41)
Albumin: <1.5 g/dL — ABNORMAL LOW (ref 3.5–5.0)
Alkaline Phosphatase: 61 U/L (ref 38–126)
Bilirubin, Direct: 0.2 mg/dL (ref 0.0–0.2)
Indirect Bilirubin: 0.2 mg/dL — ABNORMAL LOW (ref 0.3–0.9)
Total Bilirubin: 0.4 mg/dL (ref 0.0–1.2)
Total Protein: 4.7 g/dL — ABNORMAL LOW (ref 6.5–8.1)

## 2023-10-24 LAB — GLUCOSE, CAPILLARY: Glucose-Capillary: 169 mg/dL — ABNORMAL HIGH (ref 70–99)

## 2023-10-24 LAB — RETICULOCYTES
Immature Retic Fract: 22.1 % — ABNORMAL HIGH (ref 2.3–15.9)
RBC.: 2.49 MIL/uL — ABNORMAL LOW (ref 4.22–5.81)
Retic Count, Absolute: 34.4 K/uL (ref 19.0–186.0)
Retic Ct Pct: 1.4 % (ref 0.4–3.1)

## 2023-10-24 LAB — FOLATE: Folate: 9.5 ng/mL (ref >5.9)

## 2023-10-24 LAB — IRON AND TIBC: Iron: <10 ug/dL — ABNORMAL LOW (ref 45–182)

## 2023-10-24 LAB — PREPARE RBC (CROSSMATCH)

## 2023-10-24 LAB — VITAMIN B12: Vitamin B-12: 287 pg/mL (ref 180–914)

## 2023-10-24 MED ORDER — INSULIN ASPART 100 UNIT/ML IJ SOLN
0.0000 [IU] | INTRAMUSCULAR | Status: DC
  Administered 2023-10-24: 3 [IU] via SUBCUTANEOUS
  Administered 2023-10-24 (×2): 2 [IU] via SUBCUTANEOUS
  Administered 2023-10-24: 3 [IU] via SUBCUTANEOUS
  Administered 2023-10-24: 2 [IU] via SUBCUTANEOUS
  Administered 2023-10-25: 1 [IU] via SUBCUTANEOUS
  Administered 2023-10-25: 2 [IU] via SUBCUTANEOUS

## 2023-10-24 MED ORDER — SODIUM CHLORIDE 0.9 % IV SOLN
2.0000 g | Freq: Three times a day (TID) | INTRAVENOUS | Status: DC
  Administered 2023-10-24 – 2023-10-27 (×10): 2 g via INTRAVENOUS
  Filled 2023-10-24 (×10): qty 12.5

## 2023-10-24 MED ORDER — PANTOPRAZOLE SODIUM 40 MG IV SOLR
40.0000 mg | Freq: Two times a day (BID) | INTRAVENOUS | Status: DC
  Administered 2023-10-24 – 2023-10-27 (×8): 40 mg via INTRAVENOUS
  Filled 2023-10-24 (×8): qty 10

## 2023-10-24 MED ORDER — VANCOMYCIN HCL 750 MG/150ML IV SOLN
750.0000 mg | Freq: Two times a day (BID) | INTRAVENOUS | Status: DC
  Administered 2023-10-24 – 2023-10-25 (×3): 750 mg via INTRAVENOUS
  Filled 2023-10-24 (×4): qty 150

## 2023-10-24 MED ORDER — ACETAMINOPHEN 650 MG RE SUPP: 650.0000 mg | Freq: Four times a day (QID) | RECTAL | Status: DC | PRN

## 2023-10-24 MED ORDER — SODIUM CHLORIDE 0.9% IV SOLUTION: Freq: Once | INTRAVENOUS | Status: AC

## 2023-10-24 MED ORDER — SODIUM ZIRCONIUM CYCLOSILICATE 10 G PO PACK
10.0000 g | PACK | Freq: Once | ORAL | Status: AC
  Administered 2023-10-24: 10 g via ORAL
  Filled 2023-10-24: qty 1

## 2023-10-24 MED ORDER — ACETAMINOPHEN 325 MG PO TABS
650.0000 mg | ORAL_TABLET | Freq: Four times a day (QID) | ORAL | Status: DC | PRN
  Administered 2023-10-25: 650 mg via ORAL
  Filled 2023-10-24: qty 2

## 2023-10-24 NOTE — ED Notes (Signed)
 Patient transported to CT

## 2023-10-24 NOTE — ED Notes (Signed)
Wound care RN at BS.

## 2023-10-24 NOTE — Consult Note (Signed)
 WOC Nurse Consult Note: Reason for Consult:sacral and heel wound  Wound type: R arm stump wound- scattered full thickness wounds, pink,moist, scant serosanguinous drainage and dry skin in the periwound area R elbow: Unstageable pressure injury 6 cm x 3 cm, 100% covered with yellow slough, minimum yellow drainage, macerated periwound area. R leg: scattered full and partial thickness wounds- etiology unclear, pink, moist wound bed, with scant to no drainage, raised scaling and bullae scattered through out leg R lateral foot: deep tissue pressure injury,  2cm x 1 cm, intact skin with deep purple maroon discoloration, no drainage, intact periwound L leg: scattered full and partial thickness wounds- etiology unknown, pink, moist wound bed, with scant to no drainage, raised scaling and bullae scattered through out leg L lateral foot: unstageable pressure injury to mid foot 4 cm x 3 cm, 100 % covered with black eschar, no drainage, intact periwound, and Deep tissue pressure injury inferior to 5th toe, 1.5 cm x 1 cm deep purple maroon discoloration with no drainage and intact periwound L heel: unstageable pressure injury 1 cm x 1 cm covered with brown scab, no drainage and intact periwound Sacral: stage 4 pressure injury 10 cm x 9 cm x 2 cm with 4 cm undermining from 7 o'clock to 1 o'clock, moderate amount of purulent malodorous drainage, wound bed is 40% pink/red, moist, 60% covered with yellow/black slough, palpable bone, intact periwound.  Pressure Injury POA: Yes  Dressing procedure/placement/frequency:  Chart reviewed and noted CT imaging results with concern for osteomyelitis to the sacral wound, this is out of scope of treatment for the WOC team.  Would recommend surgery involvement to evaluate for treatment/debridement as appropriate. Attending provider notified. Will provide topical treatment recommendations.    R arm/ R leg/R foot/ L leg/ L foot DTPI: Cleanse with NS, pat dry.  Place Xeroform  over wound bed and cover with silicone foam dressing.  Change daily.  L foot unstageable/ L heel unstageable: Paint with betadine  and leave open to air.  R elbow/ Sacrum:  Cleanse with Vashe (lawson [phone]) allow to air dry.  Pack wound with Vashe soaked gauze, cover with dry gauze and silicone foam dressing.  Change daily.  Elevate both heels in Prevalon boots (lawson # [id number]) to off load pressure.  Utilize LALM for moisture management and pressure redistribution.  WOC team will not follow patient at this time.  Please re-consult if new needs arise.   Thank you,  Doyal Polite, MSN, RN, [facility] WOC Team [phone] (Available Mon-Fri 0700-1500)
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
## 2023-10-24 NOTE — ED Notes (Signed)
 Unalble to get temp at this time.
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
## 2023-10-24 NOTE — ED Notes (Signed)
 Called blood bank to check on blood unit. As per blood bank blood will be ready in 5 mins.
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
## 2023-10-24 NOTE — Plan of Care (Signed)
 Discussed with patient plan of care for the evening, pain management and admission criteria with some teach back displayed.  Calling family contact for more information.  Problem: Education: Goal: Ability to describe self-care measures that may prevent or decrease complications (Diabetes Survival Skills Education) will improve Outcome: Not Progressing   Problem: Coping: Goal: Ability to adjust to condition or change in health will improve Outcome: Not Progressing   Problem: Pain Managment: Goal: General experience of comfort will improve and/or be controlled Outcome: Not Progressing

## 2023-10-24 NOTE — Progress Notes (Signed)
 Pharmacy Antibiotic Note  [name] [name] is a 78 y.o. adult admitted on 10/23/2023 with AMS.  Pharmacy has been consulted for cefepime /vancomycin  dosing for sepsis with source from wound infection.  -CT: concerning for osteomyelitis -WBC 18, sCr 0.91 (~bl), afebrile   -Blood/urine cultures collected  Plan: -Cefepime  2g IV every 8 hours -Vancomycin  1g IV x1 -Vancomycin  750mg  IV every 12 hours (AUC 468, IBW, Vd 0.72, sCr 0.91) -Monitor renal function -Follow up signs of clinical improvement, LOT, de-escalation of antibiotics   Height: 6' (182.9 cm) Weight: 68 kg (150 lb) IBW/kg (Calculated) : 77.6  Temp (24hrs), Avg:98.8 F (37.1 C), Min:97.6 F (36.4 C), Max:100 F (37.8 C)  Recent Labs  Lab 10/23/23 1815 10/23/23 1819 10/23/23 2148  WBC  --  18.3*  --   CREATININE  --  1.00  0.91  --   LATICACIDVEN 1.1  --  1.7    Estimated Creatinine Clearance (by C-G formula based on SCr of 0.91 mg/dL) Male: 45.2 mL/min Male: 64.3 mL/min    Allergies  Allergen Reactions   Baclofen Other (See Comments)    Left shoulder twitching/jerking   Lisinopril  Other (See Comments)    Hyperkalemia (a high level of the electrolyte potassium in the blood)    Antimicrobials this admission: Cefepime  10/8 >>  Vancomycin  10/8 >>   Microbiology results: 10/8 BCx:  10/8 UCx:    Thank you for allowing pharmacy to be a part of this patient's care.  Lynwood Poplar, PharmD, BCPS Clinical Pharmacist 10/24/2023 3:26 AM
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
## 2023-10-25 ENCOUNTER — Inpatient Hospital Stay (HOSPITAL_COMMUNITY)

## 2023-10-25 DIAGNOSIS — A419 Sepsis, unspecified organism: Secondary | ICD-10-CM | POA: Diagnosis not present

## 2023-10-25 DIAGNOSIS — N390 Urinary tract infection, site not specified: Secondary | ICD-10-CM

## 2023-10-25 LAB — BPAM RBC
Blood Product Expiration Date: 202511052359
ISSUE DATE / TIME: 202510091639
Unit Type and Rh: 5100

## 2023-10-25 LAB — TYPE AND SCREEN
ABO/RH(D): O POS
Antibody Screen: NEGATIVE
Unit division: 0

## 2023-10-25 LAB — GLUCOSE, CAPILLARY
Glucose-Capillary: 153 mg/dL — ABNORMAL HIGH (ref 70–99)
Glucose-Capillary: 115 mg/dL — ABNORMAL HIGH (ref 70–99)
Glucose-Capillary: 117 mg/dL — ABNORMAL HIGH (ref 70–99)
Glucose-Capillary: 129 mg/dL — ABNORMAL HIGH (ref 70–99)
Glucose-Capillary: 119 mg/dL — ABNORMAL HIGH (ref 70–99)

## 2023-10-25 MED ORDER — DAPTOMYCIN-SODIUM CHLORIDE 500-0.9 MG/50ML-% IV SOLN
8.0000 mg/kg | Freq: Every day | INTRAVENOUS | Status: DC
  Administered 2023-10-25 – 2023-10-27 (×3): 500 mg via INTRAVENOUS
  Filled 2023-10-25 (×4): qty 50

## 2023-10-25 MED ORDER — SODIUM CHLORIDE (PF) 0.9% IJ SOLUTION - NO CHARGE
10.0000 mL | INTRAMUSCULAR | Status: DC | PRN
  Administered 2023-10-25: 10 mL via INTRAVENOUS

## 2023-10-25 MED ORDER — LIDOCAINE 1 % OPTIME INJ - NO CHARGE
5.0000 mL | Freq: Once | INTRAMUSCULAR | Status: AC
  Administered 2023-10-25: 5 mL via SUBCUTANEOUS

## 2023-10-25 MED ORDER — METRONIDAZOLE 500 MG PO TABS
500.0000 mg | ORAL_TABLET | Freq: Two times a day (BID) | ORAL | Status: DC
  Administered 2023-10-25 (×2): 500 mg via ORAL
  Filled 2023-10-25 (×5): qty 1

## 2023-10-25 NOTE — TOC CM/SW Note (Signed)
 Transition of Care [facility]) - Inpatient Brief Assessment   Patient Details  Name: [name] [name] MRN: [id number] Date of Birth: [birth year]  Transition of Care [facility]) CM/SW Contact:    Lauraine FORBES Saa, LCSWA Phone Number: 10/25/2023, 9:09 AM   Clinical Narrative:  9:09 AM Per chart review, patient resides at home with child(ren). Patient has a PCP and insurance. Patient has SNF history with [facility] and [facility]. Patient has CIR history with [facility]. Patient has HH history with [facility] and [facility]. Patient has DME (hospital bed, wheelchair) history. Patient's preferred pharmacy is [facility] [address]. No TOC needs identified at this time. TOC will continue to follow.  Transition of Care Asessment: Insurance and Status: Insurance coverage has been reviewed Patient has primary care physician: Yes Home environment has been reviewed: Private Residence Prior level of function:: N/A Prior/Current Home Services: No current home services Social Drivers of Health Review: SDOH reviewed no interventions necessary Readmission risk has been reviewed: Yes Transition of care needs: no transition of care needs at this time

## 2023-10-25 NOTE — Progress Notes (Signed)
 Pharmacy Antibiotic Note  [name] [name] is a 78 y.o. 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  Pharmacy has been consulted for daptomycin dosing.  Plan: - Start daptomycin IV 500 mg daily (~8 mg/kg)  - Monitor CK, clinical progression, culture data, length of therapy, and opportunities for de-escalation  - Continue metronidazole  PO 500 mg twice daily  - Continue cefepime  IV 2g every 8 hours  Height: 6' (182.9 cm) Weight:  (air mattress/bed scale error) IBW/kg (Calculated) : 77.6  Temp (24hrs), Avg:97.8 F (36.6 C), Min:97.4 F (36.3 C), Max:98.6 F (37 C)  Recent Labs  Lab 10/23/23 1815 10/23/23 1819 10/23/23 2148 10/24/23 0405  WBC  --  18.3*  --  12.4*  CREATININE  --  1.00  0.91  --  0.69  LATICACIDVEN 1.1  --  1.7  --     Estimated Creatinine Clearance: 73.2 mL/min (by C-G formula based on SCr of 0.69 mg/dL).    Allergies  Allergen Reactions   Baclofen Other (See Comments)    Left shoulder twitching/jerking   Lisinopril  Other (See Comments)    Hyperkalemia (a high level of the electrolyte potassium in the blood)    Antimicrobials this admission: Metronidazole  10/8 x1 dose Vancomycin  10/8 >> 10/10 Cefepime  10/8 >>  Metronidazole  10/10 >>  Microbiology results: 10/8 BCx: NGTD 10/8 UCx: 70K GNR    Thank you for allowing pharmacy to be a part of this patient's care.  Feliciano Close, PharmD PGY2 Infectious Diseases Pharmacy Resident  10/25/2023 1:32 PM

## 2023-10-25 NOTE — Progress Notes (Signed)
 Lab unable to obtain blood at this time with limit access for the patient.  Patient was upset, crying and asked her to come back later.

## 2023-10-25 NOTE — Progress Notes (Signed)
 Updated daughter and will come around lunch time

## 2023-10-25 NOTE — Progress Notes (Signed)
 Physical Therapy Wound Treatment Patient Details  Name: [name] [name] MRN: [id number] Date of Birth: 25-Nov-1945  Today's Date: 10/25/2023 Time: 1215-1315 Time Calculation (min): 60 min  Subjective  Subjective Assessment Subjective: pt moans with every positional change Patient and Family Stated Goals: non stated Date of Onset: 10/25/23 Prior Treatments: dressing  Pain Score:  Once patient positioned in sidelying pt did not exhibit signs of increased pain   Wound Assessment  Wound 10/25/23 1215 Pressure Injury Sacrum Medial Stage 4 - Full thickness tissue loss with exposed bone, tendon or muscle. (Active)  Site / Wound Assessment Pink;Granulation tissue;Brown;Black;Yellow;Red;Dusky 10/25/23 1400  Peri-wound Assessment Denuded;Maceration 10/25/23 1400  Wound Length (cm) 11 cm 10/25/23 1400  Wound Width (cm) 13 cm 10/25/23 1400  Wound Surface Area (cm^2) 112.31 cm^2 10/25/23 1400  Wound Depth (cm) 1.8 cm 10/25/23 1400  Wound Volume (cm^3) 134.774 cm^3 10/25/23 1400  Drainage Description Serosanguineous 10/25/23 1400  Drainage Amount Moderate 10/25/23 1400  Treatment Cleansed;Debridement (Selective);Packing (Saline gauze) 10/25/23 1400  Dressing Type Foam - Lift dressing to assess site every shift;Gauze (Comment);Moist to moist;Liquid skin adhesive 10/25/23 1400  Dressing Changed Changed 10/25/23 1400  Dressing Status Clean, Dry, Intact 10/25/23 1400  State of Healing Early/partial granulation 10/25/23 1400  % Wound base Red or Granulating 35% 10/25/23 1400  % Wound base Yellow/Fibrinous Exudate 40% 10/25/23 1400  % Wound base Black/Eschar 20% 10/25/23 1400  % Wound base Other/Granulation Tissue (Comment) 5% 10/25/23 1400  Tunneling (cm) 7 o'clock 5 cm 10/25/23 1400  Undermining (cm) 7 o'clock to 1 o'clock 4-4.5  cm 10/25/23 1400  Margins Unattached edges (unapproximated) 10/25/23 1400     Wound 10/25/23 1215 Pressure Injury Buttocks Right Unstageable - Full thickness tissue  loss in which the base of the injury is covered by slough (yellow, tan, gray, green or brown) and/or eschar (tan, brown or black) in the wound bed. (Active)  Site / Wound Assessment Yellow;Black;Brown 10/25/23 1400  Peri-wound Assessment Denuded 10/25/23 1400  Wound Length (cm) 4.5 cm 10/25/23 1400  Wound Width (cm) 2.8 cm 10/25/23 1400  Wound Surface Area (cm^2) 9.9 cm^2 10/25/23 1400  Drainage Description Purulent;Green 10/25/23 1400  Drainage Amount Small 10/25/23 1400  Treatment Cleansed;Debridement (Selective);Irrigation 10/25/23 1400  Dressing Type Gauze (Comment);Moist to moist;Foam - Lift dressing to assess site every shift 10/25/23 1400  Dressing Changed New 10/25/23 1400  Dressing Status None 10/25/23 1400  State of Healing Eschar 10/25/23 1400  % Wound base Yellow/Fibrinous Exudate 55% 10/25/23 1400  % Wound base Black/Eschar 45% 10/25/23 1400  Margins Unattached edges (unapproximated) 10/25/23 1400     Wound 10/25/23 1015 Pressure Injury Elbow Posterior;Right Unstageable - Full thickness tissue loss in which the base of the injury is covered by slough (yellow, tan, gray, green or brown) and/or eschar (tan, brown or black) in the wound bed. (Active)  Site / Wound Assessment Yellow;Brown;Granulation tissue 10/25/23 1400  Wound Length (cm) 6 cm 10/25/23 1400  Wound Width (cm) 3.5 cm 10/25/23 1400  Wound Surface Area (cm^2) 16.49 cm^2 10/25/23 1400  Drainage Description Serosanguineous 10/25/23 1400  Drainage Amount Moderate 10/25/23 1400  Treatment Cleansed;Debridement (Selective);Irrigation;Packing (Saline gauze) 10/25/23 1400  Dressing Type Foam - Lift dressing to assess site every shift;Gauze (Comment);Moist to moist 10/25/23 1400  Dressing Changed Changed 10/25/23 1400  Dressing Status Clean, Dry, Intact 10/25/23 1400  State of Healing Early/partial granulation 10/25/23 1400  % Wound base Red or Granulating 35% 10/25/23 1400  % Wound base Yellow/Fibrinous Exudate 60%  10/25/23  1400  % Wound base Black/Eschar 5% 10/25/23 1400  Margins Unattached edges (unapproximated) 10/25/23 1400   Selective Debridement (non-excisional) Selective Debridement (non-excisional) - Location: Sacrum and R elbow Selective Debridement (non-excisional) - Tools Used: Forceps, Scalpel, Scissors Selective Debridement (non-excisional) - Tissue Removed: black eschar around rim of sacral wound and yellow slough from the base, yelllow slough removed from the base of the L elbow wound    Wound Assessment and Plan  Wound Therapy - Assess/Plan/Recommendations Wound Therapy - Clinical Statement: Pt with numerous wounds, PT addressing sacral pressure wounds and L elbow pressure injury. Pt agreable to therapy, but cries out with any movement, however once placed in sidelying for treatment pt closes his eyes and falls asleep.Given pt's quadruplegia, contracted joints and cachectic appearance, indicative of long term decline, pt will have difficulty in wound healing. PT will follow to perform selective debridement of non viable tissue associated with sacral wound and wound to R elbow to reduce bioburden and promote healing Wound Therapy - Functional Problem List: quadrupelgia Factors Delaying/Impairing Wound Healing: Altered sensation, Incontinence, Infection - systemic/local, Multiple medical problems, Vascular compromise, Immobility Hydrotherapy Plan: Debridement, Patient/family education, Dressing change Wound Therapy - Frequency: 2X / week Wound Therapy - Follow Up Recommendations: dressing changes by RN  Wound Therapy Goals- Improve the function of patient's integumentary system by progressing the wound(s) through the phases of wound healing (inflammation - proliferation - remodeling) by: Wound Therapy Goals - Improve the function of patient's integumentary system by progressing the wound(s) through the phases of wound healing by: Decrease Necrotic Tissue to: 20 Decrease Necrotic Tissue -  Progress: Goal set today Improve Drainage Characteristics: Min Improve Drainage Characteristics - Progress: Goal set today Patient/Family will be able to : dress wounds and perform repositioning at least hourly Patient/Family Instruction Goal - Progress: Goal set today Goals/treatment plan/discharge plan were made with and agreed upon by patient/family: No, Patient unable to participate in goals/treatment/discharge plan and family unavailable Time For Goal Achievement: 7 days Wound Therapy - Potential for Goals: Poor  Goals will be updated until maximal potential achieved or discharge criteria met.  Discharge criteria: when goals achieved, discharge from hospital, MD decision/surgical intervention, no progress towards goals, refusal/missing three consecutive treatments without notification or medical reason.  GP     Charges PT Wound Care Charges $Wound Debridement up to 20 cm: < or equal to 20 cm $ Wound Debridement each add'l 20 sqcm: 3 $PT Hydrotherapy Visit: 2 Visits      Gerilynn Mccullars B. Fleeta Lapidus PT, DPT Acute Rehabilitation Services Please use secure chat or  Call Office [phone]  Almarie KATHEE Fleeta Fleet 10/25/2023, 3:11 PM
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
## 2023-10-25 NOTE — Procedures (Signed)
 Interventional Radiology Procedure Note  Risks and benefits of left hip joint fluid collection aspiration were discussed with the patient's daughter including, but not limited to bleeding, infection, and damage to adjacent structures.  All questions were answered, and patient's daughter is agreeable to proceed. Consent obtained over phone, and signed and in chart.  A timeout was performed with all members of the team prior to start of the procedure. Correct patient and correct procedure was confirmed. Allergies were reviewed.   PROCEDURE SUMMARY:  Successful fluoro guided lavage of left hip joint, as no native fluid returned on aspiration attempts.  No immediate complications.  Pt tolerated well.   EBL = none  Please see full dictation in imaging section of Epic for procedure details.   Electronically Signed: Carlin DELENA Griffon, PA-C 10/25/2023, 3:59 PM

## 2023-10-25 NOTE — Plan of Care (Signed)
  Problem: Metabolic: Goal: Ability to maintain appropriate glucose levels will improve Outcome: Progressing   Problem: Coping: Goal: Level of anxiety will decrease Outcome: Progressing

## 2023-10-26 DIAGNOSIS — E440 Moderate protein-calorie malnutrition: Secondary | ICD-10-CM | POA: Diagnosis not present

## 2023-10-26 DIAGNOSIS — R532 Functional quadriplegia: Secondary | ICD-10-CM | POA: Diagnosis not present

## 2023-10-26 DIAGNOSIS — D649 Anemia, unspecified: Secondary | ICD-10-CM | POA: Diagnosis not present

## 2023-10-26 DIAGNOSIS — L89154 Pressure ulcer of sacral region, stage 4: Secondary | ICD-10-CM | POA: Diagnosis not present

## 2023-10-26 DIAGNOSIS — Z515 Encounter for palliative care: Secondary | ICD-10-CM | POA: Diagnosis not present

## 2023-10-26 DIAGNOSIS — A419 Sepsis, unspecified organism: Secondary | ICD-10-CM | POA: Diagnosis not present

## 2023-10-26 DIAGNOSIS — M4628 Osteomyelitis of vertebra, sacral and sacrococcygeal region: Secondary | ICD-10-CM | POA: Diagnosis not present

## 2023-10-26 DIAGNOSIS — R739 Hyperglycemia, unspecified: Secondary | ICD-10-CM

## 2023-10-26 DIAGNOSIS — Z7189 Other specified counseling: Secondary | ICD-10-CM | POA: Diagnosis not present

## 2023-10-26 LAB — PHOSPHORUS: Phosphorus: 2.2 mg/dL — ABNORMAL LOW (ref 2.5–4.6)

## 2023-10-26 LAB — GLUCOSE, CAPILLARY
Glucose-Capillary: 103 mg/dL — ABNORMAL HIGH (ref 70–99)
Glucose-Capillary: 111 mg/dL — ABNORMAL HIGH (ref 70–99)
Glucose-Capillary: 117 mg/dL — ABNORMAL HIGH (ref 70–99)
Glucose-Capillary: 117 mg/dL — ABNORMAL HIGH (ref 70–99)
Glucose-Capillary: 120 mg/dL — ABNORMAL HIGH (ref 70–99)
Glucose-Capillary: 124 mg/dL — ABNORMAL HIGH (ref 70–99)

## 2023-10-26 LAB — COMPREHENSIVE METABOLIC PANEL WITH GFR
ALT: 9 U/L (ref 0–44)
AST: 14 U/L — ABNORMAL LOW (ref 15–41)
Albumin: 1.6 g/dL — ABNORMAL LOW (ref 3.5–5.0)
Alkaline Phosphatase: 73 U/L (ref 38–126)
Anion gap: 12 (ref 5–15)
BUN: 45 mg/dL — ABNORMAL HIGH (ref 8–23)
CO2: 16 mmol/L — ABNORMAL LOW (ref 22–32)
Calcium: 8.1 mg/dL — ABNORMAL LOW (ref 8.9–10.3)
Chloride: 108 mmol/L (ref 98–111)
Creatinine, Ser: 0.69 mg/dL (ref 0.61–1.24)
GFR, Estimated: >60 mL/min (ref >60)
Glucose, Bld: 116 mg/dL — ABNORMAL HIGH (ref 70–99)
Potassium: 4.4 mmol/L (ref 3.5–5.1)
Sodium: 136 mmol/L (ref 135–145)
Total Bilirubin: 0.7 mg/dL (ref 0.0–1.2)
Total Protein: 6.6 g/dL (ref 6.5–8.1)

## 2023-10-26 LAB — CBC
HCT: 33 % — ABNORMAL LOW (ref 39.0–52.0)
Hemoglobin: 10.4 g/dL — ABNORMAL LOW (ref 13.0–17.0)
MCH: 27.9 pg (ref 26.0–34.0)
MCHC: 31.5 g/dL (ref 30.0–36.0)
MCV: 88.5 fL (ref 80.0–100.0)
Platelets: 470 K/uL — ABNORMAL HIGH (ref 150–400)
RBC: 3.73 MIL/uL — ABNORMAL LOW (ref 4.22–5.81)
RDW: 16 % — ABNORMAL HIGH (ref 11.5–15.5)
WBC: 10.7 K/uL — ABNORMAL HIGH (ref 4.0–10.5)
nRBC: 0 % (ref 0.0–0.2)

## 2023-10-26 LAB — URINE CULTURE: Culture: 70000 — AB

## 2023-10-26 LAB — MAGNESIUM: Magnesium: 1.8 mg/dL (ref 1.7–2.4)

## 2023-10-26 MED ORDER — HYDROMORPHONE HCL 1 MG/ML IJ SOLN
0.5000 mg | INTRAMUSCULAR | Status: DC | PRN
  Administered 2023-10-26 – 2023-10-27 (×2): 0.5 mg via INTRAVENOUS
  Filled 2023-10-26 (×2): qty 0.5

## 2023-10-26 MED ORDER — SODIUM CHLORIDE (PF) 0.9 % IJ SOLN
INTRAMUSCULAR | Status: AC
  Administered 2023-10-26: 10 mL
  Filled 2023-10-26: qty 10

## 2023-10-26 NOTE — Plan of Care (Signed)
  Problem: Metabolic: Goal: Ability to maintain appropriate glucose levels will improve Outcome: Progressing   Problem: Clinical Measurements: Goal: Respiratory complications will improve Outcome: Progressing Goal: Cardiovascular complication will be avoided Outcome: Progressing   Problem: Pain Managment: Goal: General experience of comfort will improve and/or be controlled Outcome: Progressing   Problem: Safety: Goal: Ability to remain free from injury will improve Outcome: Progressing

## 2023-10-26 NOTE — Progress Notes (Signed)
 [facility]    Date of Admission:  10/23/2023   Total days of antibiotics 4   ID: [name] [name] is a 78 y.o. 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   Patient unable to answer questions/ does not appear to comprehend but has eyes opened.  Medications:   insulin  aspart  0-9 Units Subcutaneous Q4H   metroNIDAZOLE   500 mg Oral Q12H   pantoprazole  (PROTONIX ) IV  40 mg Intravenous Q12H    Objective: Vital signs in last 24 hours: Temp:  [96.3 F (35.7 C)-97.6 F (36.4 C)] 97.6 F (36.4 C) (10/11 0744) Pulse Rate:  [80-189] 90 (10/11 0744) Resp:  [13-16] 16 (10/11 0744) BP: (90-110)/(61-78) 98/78 (10/11 0744) SpO2:  [93 %-100 %] 100 % (10/11 0744)  Physical Exam  Constitutional:  He appears under-developed and malnourished-nourished. No distress.  HENT: bitemporal wasting Mouth/Throat: Oropharynx is dry. No oropharyngeal exudate.  Cardiovascular: Normal rate, regular rhythm and normal heart sounds. Exam reveals no gallop and no friction rub.  No murmur heard.  Pulmonary/Chest: Effort normal and breath sounds normal. No respiratory distress. He has no wheezes.  Abdominal: Soft. Bowel sounds are normal. He exhibits no  distension. There is no tenderness.  Skin: Skin is warm and dry. No rash noted. No erythema.    Lab Results Recent Labs    10/24/23 0405 10/26/23 0226  WBC 12.4* 10.7*  HGB 7.1* 10.4*  HCT 24.4* 33.0*  NA 136 136  K 5.4* 4.4  CL 110 108  CO2 17* 16*  BUN 61* 45*  CREATININE 0.69 0.69   Liver Panel Recent Labs    10/24/23 0405 10/26/23 0226  PROT 4.7* 6.6  ALBUMIN <1.5* 1.6*  AST <10* 14*  ALT 6 9  ALKPHOS 61 73  BILITOT 0.4 0.7  BILIDIR 0.2  --   IBILI 0.2*  --    Sedimentation Rate Recent Labs    10/23/23 2143  ESRSEDRATE 124*   C-Reactive Protein Recent Labs    10/23/23 2143  CRP 10.6*    Microbiology: 10/8 Blood cx ngtd 10/8 Urine cx enterobacter S cefepime  Studies/Results: DG FL ASP/INJ MAJOR (SHOULDER, HIP, KNEE) Result Date: 10/26/2023 CLINICAL DATA:  CLINICAL DATA [id number] Septic arthritis [facility]) 9050 78 year old male with concerns for LEFT hip septic arthritis. IR was requested for aspiration. EXAM: DIAGNOSTIC LEFT HIP ASPIRATION UNDER FLUOROSCOPIC GUIDANCE FLUOROSCOPY: Radiation Exposure Index and estimated peak skin dose (PSD); Reference air kerma (RAK), 4.4 mGy. PROCEDURE: The risks and benefits of the procedure were discussed with the patient's daughter, and verbal informed consent was obtained by phone. A formal timeout procedure was performed with the patient according to departmental protocol. The patient was placed supine on the fluoroscopy table and the LEFT hip joint was identified under  fluoroscopy. The skin overlying the left hip joint was subsequently cleaned with Chloraprep and a sterile drape was placed over the area of interest. 5 ml 1% Lidocaine  was used to anesthetize the skin around the needle insertion site. Multiple aspiration attempts were performed with a 20 gauge spinal needle, which was inserted into the left hip joint at several locations under fluoroscopy. Needle position was confirmed under fluoroscopy at each attempt. However, no  fluid returned. A lavage was attempted with 3 mL of fluid at the femoral neck, with 2 mL of bloody fluid returning. The needle was removed and hemostasis was achieved. The obtained sample was sent for laboratory analysis. IMPRESSION: Successful fluoroscopic-guided diagnostic LEFT hip aspiration. Procedure performed by Carlin Griffon, PA-C under direct supervision of Thom Hall, MD Electronically Signed   By: Thom Hall M.D.   On: 10/26/2023 06:12     Assessment/Plan: 78yo M with functional quadriplegia, multiple pressure sores, unclear mentation/comprehension, admitted for sepsis thought to be due to wound infection vs. Uti. Imaging possible septic arthritis, aspiration unsuccessful, only able to do lavage   - unclear that abtx would improve his overall process given current state of health. Agree with plans for palliative care consult and goals of care discussion - for right now can continue on iv therapy, after 7 days, can transition to orals with amox/clav 875mg  po bid plus doxycycline  100mg  po bid for additional 7 days for a total of 14 days  Continue with wound care and off loading  ?uti = patient found to be isolated with enterobacter. Though low colony count. Finish 7 days as possible source with cefepime , currently day 4.  Will sign off  evaluation of this patient requires complex antimicrobial therapy evaluation and counseling and isolation needs for disease transmission risk assessment and mitigation.   I have personally spent 35  minutes involved in face-to-face and non-face-to-face activities for this patient on the day of the visit. Professional time spent includes the following activities: Preparing to see the patient (review of tests), Performing a medically appropriate examination and/or evaluation , Ordering medications/tests/procedures, referring and communicating with other health care professionals, Documenting clinical information in the EMR, Independently interpreting  results (not separately reported), Communicating results to the patient    [facility] Pager: [phone]  10/26/2023, 11:32 AM

## 2023-10-26 NOTE — Plan of Care (Signed)
   Problem: Coping: Goal: Ability to adjust to condition or change in health will improve Outcome: Progressing
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
## 2023-10-27 ENCOUNTER — Other Ambulatory Visit: Payer: Self-pay

## 2023-10-27 ENCOUNTER — Inpatient Hospital Stay (HOSPITAL_COMMUNITY)

## 2023-10-27 DIAGNOSIS — I6340 Cerebral infarction due to embolism of unspecified cerebral artery: Secondary | ICD-10-CM

## 2023-10-27 DIAGNOSIS — R4182 Altered mental status, unspecified: Secondary | ICD-10-CM | POA: Diagnosis not present

## 2023-10-27 DIAGNOSIS — L89154 Pressure ulcer of sacral region, stage 4: Secondary | ICD-10-CM

## 2023-10-27 DIAGNOSIS — R532 Functional quadriplegia: Secondary | ICD-10-CM | POA: Diagnosis not present

## 2023-10-27 DIAGNOSIS — R569 Unspecified convulsions: Secondary | ICD-10-CM

## 2023-10-27 DIAGNOSIS — T361X5A Adverse effect of cephalosporins and other beta-lactam antibiotics, initial encounter: Secondary | ICD-10-CM

## 2023-10-27 DIAGNOSIS — E440 Moderate protein-calorie malnutrition: Secondary | ICD-10-CM | POA: Diagnosis not present

## 2023-10-27 LAB — HEMOGLOBIN AND HEMATOCRIT, BLOOD
HCT: 34.9 % — ABNORMAL LOW (ref 39.0–52.0)
Hemoglobin: 10.2 g/dL — ABNORMAL LOW (ref 13.0–17.0)

## 2023-10-27 LAB — GLUCOSE, CAPILLARY
Glucose-Capillary: 102 mg/dL — ABNORMAL HIGH (ref 70–99)
Glucose-Capillary: 107 mg/dL — ABNORMAL HIGH (ref 70–99)
Glucose-Capillary: 110 mg/dL — ABNORMAL HIGH (ref 70–99)
Glucose-Capillary: 111 mg/dL — ABNORMAL HIGH (ref 70–99)
Glucose-Capillary: 111 mg/dL — ABNORMAL HIGH (ref 70–99)
Glucose-Capillary: 117 mg/dL — ABNORMAL HIGH (ref 70–99)
Glucose-Capillary: 121 mg/dL — ABNORMAL HIGH (ref 70–99)

## 2023-10-27 LAB — CBC
HCT: 33.3 % — ABNORMAL LOW (ref 39.0–52.0)
Hemoglobin: 9.9 g/dL — ABNORMAL LOW (ref 13.0–17.0)
MCH: 27.6 pg (ref 26.0–34.0)
MCHC: 29.7 g/dL — ABNORMAL LOW (ref 30.0–36.0)
MCV: 92.8 fL (ref 80.0–100.0)
Platelets: 383 K/uL (ref 150–400)
RBC: 3.59 MIL/uL — ABNORMAL LOW (ref 4.22–5.81)
RDW: 16.1 % — ABNORMAL HIGH (ref 11.5–15.5)
WBC: 9.9 K/uL (ref 4.0–10.5)
nRBC: 0.2 % (ref 0.0–0.2)

## 2023-10-27 LAB — BLOOD GAS, VENOUS
Acid-base deficit: 12.6 mmol/L — ABNORMAL HIGH (ref 0.0–2.0)
Bicarbonate: 13 mmol/L — ABNORMAL LOW (ref 20.0–28.0)
O2 Saturation: 86.8 %
Patient temperature: 36.3
pCO2, Ven: 28 mmHg — ABNORMAL LOW (ref 44–60)
pH, Ven: 7.27 (ref 7.25–7.43)
pO2, Ven: 51 mmHg — ABNORMAL HIGH (ref 32–45)

## 2023-10-27 LAB — BASIC METABOLIC PANEL WITH GFR
Anion gap: 13 (ref 5–15)
BUN: 44 mg/dL — ABNORMAL HIGH (ref 8–23)
CO2: 12 mmol/L — ABNORMAL LOW (ref 22–32)
Calcium: 8.1 mg/dL — ABNORMAL LOW (ref 8.9–10.3)
Chloride: 113 mmol/L — ABNORMAL HIGH (ref 98–111)
Creatinine, Ser: 0.79 mg/dL (ref 0.61–1.24)
GFR, Estimated: >60 mL/min (ref >60)
Glucose, Bld: 115 mg/dL — ABNORMAL HIGH (ref 70–99)
Potassium: 4.4 mmol/L (ref 3.5–5.1)
Sodium: 138 mmol/L (ref 135–145)

## 2023-10-27 MED ORDER — ASPIRIN 81 MG PO TBEC: 81.0000 mg | DELAYED_RELEASE_TABLET | Freq: Every day | ORAL | Status: DC

## 2023-10-27 MED ORDER — SODIUM CHLORIDE 0.9 % IV SOLN
1.0000 g | Freq: Three times a day (TID) | INTRAVENOUS | Status: DC
  Administered 2023-10-27: 1 g via INTRAVENOUS
  Filled 2023-10-27 (×5): qty 20

## 2023-10-27 MED ORDER — STROKE: EARLY STAGES OF RECOVERY BOOK
Freq: Once | Status: DC
  Filled 2023-10-27 (×2): qty 1

## 2023-10-27 MED ORDER — ASPIRIN 300 MG RE SUPP
300.0000 mg | Freq: Every day | RECTAL | Status: DC
Start: 2023-10-27 — End: 2023-10-28
  Administered 2023-10-27: 300 mg via RECTAL
  Filled 2023-10-27 (×2): qty 1

## 2023-10-27 NOTE — Progress Notes (Signed)
 Dear Doctor: This patient has been identified as a candidate for CVC for the following reason (s): IV therapy over 48 hours and poor veins/poor circulatory system (CHF, COPD, emphysema, diabetes, steroid use, IV drug abuse, etc.) bilateral arm contractures. If you agree, please write an order for the indicated device.  Thank you for supporting the early vascular access assessment program.

## 2023-10-27 NOTE — Progress Notes (Addendum)
 EEG complete - results pending

## 2023-10-27 NOTE — Progress Notes (Signed)
 Daughter is requesting for provider to contact her in AM once in the room for morning rounds. Confirmed mobile number is [phone].

## 2023-10-27 NOTE — Progress Notes (Signed)
 Spoke with dtr, [name],  at length re PICC placement consent. [name] stated she wanted to speak with the doctor prior to giving consent for placement.  Dr Trixie and Izetta RN notified of pt request after approximately 20 minute phone conversation. Assessed pt due to report of contractures.  BUE contractures present with decub ulcers possible at elbows- covered with dressings.  Notified Dr Trixie that pt would need to be referred to IR for PICC placement post assessment. Dr Trixie did state he attempted multiple times to reach dtr with no answer.
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
## 2023-10-27 NOTE — Progress Notes (Signed)
 VAST consult. No appropriate vein found. This patient is contracted to bilateral arms. Assessed with US . Recommend CVC. Powell Bowler, RN VAST

## 2023-10-27 NOTE — Procedures (Addendum)
 History: 78 year old male being evaluated for encephalopathy, eeg to rule out seizure  EEG Duration: 23 minutes  Sedation: 22 minutes  Patient State: Awake and drowsy  Technique: This EEG was acquired with electrodes placed according to the International 10-20 electrode system (including Fp1, Fp2, F3, F4, C3, C4, P3, P4, O1, O2, T3, T4, T5, T6, A1, A2, Fz, Cz, Pz). The following electrodes were missing or displaced: none.   Background: Much of the EEG is obscured by muscle artifact from jaw tremor.  The muscle artifact from the jaw tremor is bilateral, but much more prominent on the right than left.  Following the tech holding the gel, the background consist of generalized irregular slow activity, predominantly delta range with some theta range activity as well.  The posterior dominant rhythm is not well-visualized.  Clear sleep structures were not recorded  Photic stimulation: Physiologic driving is not performed  EEG Abnormalities:  1) generalized irregular slow activity 2) absent posterior dominant rhythm  Clinical Interpretation: This EEG is consistent with a generalized nonspecific cerebral dysfunction (encephalopathy).   There was no seizure or seizure predisposition recorded on this study. Please note that lack of epileptiform activity on EEG does not preclude the possibility of epilepsy.   The jaw tremor seen had no electrographic correlate on surface electrodes.  This alone does not exclude simple partial seizure given the predominance on one side, however given that the activity abates with jaw repositioning, seizure is very unlikely.   Aisha Seals, MD [facility]   If 7pm- 7am, please page neurology on call as listed in AMION.

## 2023-10-27 NOTE — Progress Notes (Signed)
 Pharmacy Antibiotic Note  [name] [name] is a 78 y.o. male admitted on 10/23/2023 with sepsis.  Pharmacy has been consulted for meropenem dosing.  Currently on cefepime  with worsening encephalopathy, transitioning to meropenem. WBC wnl today, afebrile. Scr stable.  Plan: Stop cefepime  Start meropenem 1g IV q8h to finish 7 days of IV therapy Continue daptomycin per MD F/u plans for transition to oral Augmentin /doxy after IV therapy per ID notes  Height: 6' (182.9 cm) Weight:  (air mattress/bed scale error) IBW/kg (Calculated) : 77.6  Temp (24hrs), Avg:97.4 F (36.3 C), Min:97.2 F (36.2 C), Max:97.6 F (36.4 C)  Recent Labs  Lab 10/23/23 1815 10/23/23 1819 10/23/23 2148 10/24/23 0405 10/26/23 0226 10/27/23 0335  WBC  --  18.3*  --  12.4* 10.7* 9.9  CREATININE  --  1.00  0.91  --  0.69 0.69 0.79  LATICACIDVEN 1.1  --  1.7  --   --   --     Estimated Creatinine Clearance: 73.2 mL/min (by C-G formula based on SCr of 0.79 mg/dL).    Allergies  Allergen Reactions   Baclofen Other (See Comments)    Left shoulder twitching/jerking   Lisinopril  Other (See Comments)    Hyperkalemia (a high level of the electrolyte potassium in the blood)    Antimicrobials this admission: vanc 10/8 >> 10/10 cefepime  10/8 >> 10/12 meropenem 10/12 >>  dapto 10/10 >>    Microbiology results: 10/8 BCx: ngtd  x4d 10/8 UCx: enterobacter cloacae, R- Zosyn     Thank you for allowing pharmacy to be a part of this patient's care.   Nidia Schaffer, PharmD PGY2 Cardiology Pharmacy Resident  Please check AMION for all [facility] Pharmacy phone numbers After 10:00 PM, call Main Pharmacy [phone] 10/27/2023 10:29 AM
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
## 2023-10-28 ENCOUNTER — Encounter (HOSPITAL_COMMUNITY)

## 2023-10-28 ENCOUNTER — Inpatient Hospital Stay (HOSPITAL_COMMUNITY)

## 2023-10-28 ENCOUNTER — Other Ambulatory Visit: Payer: Self-pay

## 2023-10-28 ENCOUNTER — Encounter (HOSPITAL_COMMUNITY): Payer: Self-pay

## 2023-10-28 ENCOUNTER — Encounter (HOSPITAL_BASED_OUTPATIENT_CLINIC_OR_DEPARTMENT_OTHER): Admitting: General Surgery

## 2023-10-28 DIAGNOSIS — Z515 Encounter for palliative care: Secondary | ICD-10-CM | POA: Diagnosis not present

## 2023-10-28 DIAGNOSIS — A419 Sepsis, unspecified organism: Secondary | ICD-10-CM | POA: Diagnosis not present

## 2023-10-28 DIAGNOSIS — N390 Urinary tract infection, site not specified: Secondary | ICD-10-CM | POA: Diagnosis not present

## 2023-10-28 DIAGNOSIS — I69391 Dysphagia following cerebral infarction: Secondary | ICD-10-CM

## 2023-10-28 DIAGNOSIS — M4628 Osteomyelitis of vertebra, sacral and sacrococcygeal region: Secondary | ICD-10-CM | POA: Diagnosis not present

## 2023-10-28 DIAGNOSIS — R251 Tremor, unspecified: Secondary | ICD-10-CM | POA: Diagnosis not present

## 2023-10-28 DIAGNOSIS — I6389 Other cerebral infarction: Secondary | ICD-10-CM

## 2023-10-28 DIAGNOSIS — E1151 Type 2 diabetes mellitus with diabetic peripheral angiopathy without gangrene: Secondary | ICD-10-CM

## 2023-10-28 DIAGNOSIS — T83511D Infection and inflammatory reaction due to indwelling urethral catheter, subsequent encounter: Secondary | ICD-10-CM

## 2023-10-28 DIAGNOSIS — Z66 Do not resuscitate: Secondary | ICD-10-CM | POA: Diagnosis not present

## 2023-10-28 DIAGNOSIS — Z7189 Other specified counseling: Secondary | ICD-10-CM | POA: Diagnosis not present

## 2023-10-28 DIAGNOSIS — D649 Anemia, unspecified: Secondary | ICD-10-CM | POA: Diagnosis not present

## 2023-10-28 DIAGNOSIS — Z7982 Long term (current) use of aspirin: Secondary | ICD-10-CM

## 2023-10-28 HISTORY — PX: IR PATIENT EVAL TECH 0-60 MINS: IMG5564

## 2023-10-28 LAB — CULTURE, BLOOD (ROUTINE X 2)
Culture: NO GROWTH
Culture: NO GROWTH
Special Requests: ADEQUATE

## 2023-10-28 LAB — ECHOCARDIOGRAM COMPLETE
AR max vel: 2.91 cm2
AV Area VTI: 2.85 cm2
AV Area mean vel: 2.73 cm2
AV Mean grad: 2 mmHg
AV Peak grad: 3.5 mmHg
Ao pk vel: 0.93 m/s
Area-P 1/2: 6.17 cm2
Height: 72 in
S' Lateral: 2.2 cm
Weight: 2400 [oz_av]

## 2023-10-28 LAB — GLUCOSE, BODY FLUID OTHER: Glucose, Body Fluid Other: 11 mg/dL

## 2023-10-28 LAB — BASIC METABOLIC PANEL WITH GFR
Anion gap: 16 — ABNORMAL HIGH (ref 5–15)
BUN: 43 mg/dL — ABNORMAL HIGH (ref 8–23)
CO2: 12 mmol/L — ABNORMAL LOW (ref 22–32)
Calcium: 8.2 mg/dL — ABNORMAL LOW (ref 8.9–10.3)
Chloride: 110 mmol/L (ref 98–111)
Creatinine, Ser: 0.92 mg/dL (ref 0.61–1.24)
GFR, Estimated: >60 mL/min (ref >60)
Glucose, Bld: 108 mg/dL — ABNORMAL HIGH (ref 70–99)
Potassium: 5.2 mmol/L — ABNORMAL HIGH (ref 3.5–5.1)
Sodium: 138 mmol/L (ref 135–145)

## 2023-10-28 LAB — GLUCOSE, CAPILLARY
Glucose-Capillary: 103 mg/dL — ABNORMAL HIGH (ref 70–99)
Glucose-Capillary: 97 mg/dL (ref 70–99)

## 2023-10-28 LAB — PROTEIN, BODY FLUID (OTHER): Total Protein, Body Fluid Other: 0.4 g/dL

## 2023-10-28 LAB — LACTIC ACID, PLASMA: Lactic Acid, Venous: 1.2 mmol/L (ref 0.5–1.9)

## 2023-10-28 LAB — MAGNESIUM: Magnesium: 1.9 mg/dL (ref 1.7–2.4)

## 2023-10-28 LAB — PHOSPHORUS: Phosphorus: 3.1 mg/dL (ref 2.5–4.6)

## 2023-10-28 MED ORDER — SCOPOLAMINE 1 MG/3DAYS TD PT72
1.0000 | MEDICATED_PATCH | TRANSDERMAL | Status: DC
  Administered 2023-10-28 – 2023-11-03 (×3): 1 mg via TRANSDERMAL
  Filled 2023-10-28 (×3): qty 1

## 2023-10-28 MED ORDER — ACETAMINOPHEN 650 MG RE SUPP
650.0000 mg | Freq: Four times a day (QID) | RECTAL | Status: DC | PRN
Start: 2023-10-28 — End: 2023-12-03
  Filled 2023-10-28: qty 1

## 2023-10-28 MED ORDER — LIDOCAINE-EPINEPHRINE 1 %-1:100000 IJ SOLN
INTRAMUSCULAR | Status: AC
  Filled 2023-10-28: qty 1

## 2023-10-28 MED ORDER — SODIUM ZIRCONIUM CYCLOSILICATE 10 G PO PACK: 10.0000 g | PACK | Freq: Once | ORAL | Status: DC

## 2023-10-28 MED ORDER — POLYVINYL ALCOHOL 1.4 % OP SOLN: 1.0000 [drp] | Freq: Four times a day (QID) | OPHTHALMIC | Status: DC | PRN

## 2023-10-28 MED ORDER — OXYCODONE HCL 20 MG/ML PO CONC: 5.0000 mg | ORAL | Status: DC | PRN

## 2023-10-28 MED ORDER — HALOPERIDOL LACTATE 2 MG/ML PO CONC: 2.0000 mg | Freq: Four times a day (QID) | ORAL | Status: DC | PRN

## 2023-10-28 MED ORDER — LORAZEPAM 2 MG/ML PO CONC: 1.0000 mg | ORAL | Status: DC | PRN

## 2023-10-28 MED ORDER — GLYCOPYRROLATE 1 MG PO TABS: 1.0000 mg | ORAL_TABLET | ORAL | Status: DC | PRN

## 2023-10-28 MED ORDER — HEPARIN SOD (PORK) LOCK FLUSH 100 UNIT/ML IV SOLN
INTRAVENOUS | Status: AC
  Filled 2023-10-28: qty 5

## 2023-10-28 MED ORDER — GLYCOPYRROLATE 0.2 MG/ML IJ SOLN
0.2000 mg | INTRAMUSCULAR | Status: DC | PRN
  Filled 2023-10-28: qty 1

## 2023-10-28 MED ORDER — GLYCOPYRROLATE 0.2 MG/ML IJ SOLN
0.2000 mg | INTRAMUSCULAR | Status: DC | PRN
  Administered 2023-10-29: 0.2 mg via SUBCUTANEOUS

## 2023-10-28 MED ORDER — BIOTENE DRY MOUTH MT LIQD
15.0000 mL | Freq: Two times a day (BID) | OROMUCOSAL | Status: DC
  Administered 2023-10-28 – 2023-12-02 (×65): 15 mL via TOPICAL

## 2023-10-28 MED ORDER — BISACODYL 10 MG RE SUPP: 10.0000 mg | Freq: Every day | RECTAL | Status: DC | PRN

## 2023-10-28 MED ORDER — HYDROMORPHONE HCL 1 MG/ML IJ SOLN: 0.5000 mg | INTRAMUSCULAR | Status: DC | PRN

## 2023-10-28 MED ORDER — LORAZEPAM 1 MG PO TABS
1.0000 mg | ORAL_TABLET | ORAL | Status: DC | PRN
Start: 2023-10-28 — End: 2023-10-29

## 2023-10-28 MED ORDER — LORAZEPAM 2 MG/ML IJ SOLN: 1.0000 mg | INTRAMUSCULAR | Status: DC | PRN

## 2023-10-28 MED ORDER — DIPHENHYDRAMINE HCL 50 MG/ML IJ SOLN: 25.0000 mg | INTRAMUSCULAR | Status: DC | PRN

## 2023-10-28 MED ORDER — ACETAMINOPHEN 325 MG PO TABS
650.0000 mg | ORAL_TABLET | Freq: Four times a day (QID) | ORAL | Status: DC | PRN
  Administered 2023-11-02 – 2023-11-25 (×3): 650 mg via ORAL
  Filled 2023-10-28 (×3): qty 2

## 2023-10-28 MED ORDER — HALOPERIDOL LACTATE 5 MG/ML IJ SOLN: 2.0000 mg | Freq: Four times a day (QID) | INTRAMUSCULAR | Status: DC | PRN

## 2023-10-28 MED ORDER — ONDANSETRON HCL 4 MG/2ML IJ SOLN: 4.0000 mg | Freq: Four times a day (QID) | INTRAMUSCULAR | Status: DC | PRN

## 2023-10-28 MED ORDER — ONDANSETRON 4 MG PO TBDP: 4.0000 mg | ORAL_TABLET | Freq: Four times a day (QID) | ORAL | Status: DC | PRN

## 2023-10-28 MED ORDER — HALOPERIDOL 1 MG PO TABS: 2.0000 mg | ORAL_TABLET | Freq: Four times a day (QID) | ORAL | Status: DC | PRN

## 2023-10-28 MED ORDER — SODIUM CHLORIDE 0.9 % IV BOLUS: 1000.0000 mL | Freq: Once | INTRAVENOUS | Status: DC

## 2023-10-28 NOTE — Progress Notes (Signed)
 PT Cancellation Note  Patient Details Name: [name] [name] MRN: [id number] DOB: [birth year]   Cancelled Treatment:    Reason Eval/Treat Not Completed: PT screened, no needs identified, will sign off (Pt was bedbound PTA.  Called and spoke with daughter and pt has no PT needs currently.)   [name] Bevel 10/28/2023, 9:39 AM Stephane M,PT Acute Rehab Services [phone]

## 2023-10-28 NOTE — Progress Notes (Signed)
 PICC order noted. Pt is not a candidate for bedside PICC insertion due to contractures. Pt referred to. RN PICC Order to be canceled.
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
## 2023-10-28 NOTE — Progress Notes (Signed)
 SLP Cancellation Note  Patient Details Name: [name] [name] MRN: [id number] DOB: 12-27-1945   Cancelled treatment:       Reason Eval/Treat Not Completed: Patient at procedure or test/unavailable (pt off the unit in IR for PICC placement). SLP will f/u as able.    [name], M.A., CCC-SLP Speech Language Pathology, Acute Rehabilitation Services  Secure Chat preferred [phone]  10/28/2023, 11:12 AM

## 2023-10-28 NOTE — Procedures (Signed)
 Procedure canceled. Patient going to palliative care.
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
## 2023-10-28 NOTE — Progress Notes (Addendum)
 OT Cancellation Note  Patient Details Name: [name] [name] MRN: [id number] DOB: [birth year]   Cancelled Treatment:    Reason Eval/Treat Not Completed: OT screened, no needs identified, will sign off Discussed with PT who spoke with pt's daughter this AM and declines need for PT/OT. Pt bedbound at baseline, overall dependent for ADLs and noted poor prognosis in chart with ongoing GOC discussions. Will sign off for OT. Please reconsult if needs change.  Mliss Fish 10/28/2023, 12:13 PM
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
## 2023-10-28 NOTE — Care Management Important Message (Signed)
 Important Message  Patient Details  Name: [name] [name] MRN: [id number] Date of Birth: 1945-02-16   Important Message Given:  Yes - Medicare IM     [name] 10/28/2023, 3:37 PM

## 2023-10-28 NOTE — Progress Notes (Signed)
 Got patient settled in the room.  Did not have enough time to complete admission assessment.  I informed the night shift nurse Hadassah HERO. RN and asked if she could complete the admission charting.  I also informed the night shift charge nurse Lourdes A.

## 2023-10-28 NOTE — TOC Progression Note (Addendum)
 Transition of Care [facility]) - Progression Note    Patient Details  Name: [name] [name] MRN: [id number] Date of Birth: 05/31/1945  [facility] of [facility]) CM/SW Contact  Andrez JULIANNA George, RN Phone Number: 10/28/2023, 12:25 PM  Clinical Narrative:     CM consulted for changing pts mattress at home. Per daughter she doesn't feel the air mattress at home is functioning properly. She thought the mattress was through [employer]. [employer] says they didn't provide the air mattress. CM asked the daughter to call with the company name after she returns home.  Pt on IV abx. Has multiple sacral wounds. IP Care management following.  1255: pt is changing to comfort care with [facility] consult for [facility].                     Expected Discharge Plan and Services                                               Social Drivers of Health (SDOH) Interventions SDOH Screenings   Food Insecurity: No Food Insecurity (10/24/2023)  Housing: Low Risk  (10/24/2023)  Transportation Needs: No Transportation Needs (10/24/2023)  Utilities: Not At Risk (10/24/2023)  Social Connections: Moderately Integrated (10/24/2023)  Tobacco Use: Medium Risk (10/24/2023)    Readmission Risk Interventions     No data to display

## 2023-10-28 NOTE — Progress Notes (Signed)
 IV Team consult placed to start PIV. Pt has been assessed by 2 IV RNs (1 PICC RN) with and without US . IV Team unable to place PIV or PICC. Recommend central line placement.

## 2023-10-29 DIAGNOSIS — Z515 Encounter for palliative care: Secondary | ICD-10-CM | POA: Diagnosis not present

## 2023-10-29 DIAGNOSIS — Z66 Do not resuscitate: Secondary | ICD-10-CM

## 2023-10-29 DIAGNOSIS — I959 Hypotension, unspecified: Secondary | ICD-10-CM | POA: Diagnosis not present

## 2023-10-29 DIAGNOSIS — M4628 Osteomyelitis of vertebra, sacral and sacrococcygeal region: Secondary | ICD-10-CM

## 2023-10-29 DIAGNOSIS — D649 Anemia, unspecified: Secondary | ICD-10-CM | POA: Diagnosis not present

## 2023-10-29 DIAGNOSIS — I6389 Other cerebral infarction: Secondary | ICD-10-CM | POA: Diagnosis not present

## 2023-10-29 DIAGNOSIS — E1151 Type 2 diabetes mellitus with diabetic peripheral angiopathy without gangrene: Secondary | ICD-10-CM | POA: Diagnosis not present

## 2023-10-29 DIAGNOSIS — I69391 Dysphagia following cerebral infarction: Secondary | ICD-10-CM | POA: Diagnosis not present

## 2023-10-29 DIAGNOSIS — Z7189 Other specified counseling: Secondary | ICD-10-CM

## 2023-10-29 DIAGNOSIS — A419 Sepsis, unspecified organism: Secondary | ICD-10-CM | POA: Diagnosis not present

## 2023-10-29 LAB — GLUCOSE, CAPILLARY
Glucose-Capillary: 69 mg/dL — ABNORMAL LOW (ref 70–99)
Glucose-Capillary: 75 mg/dL (ref 70–99)

## 2023-10-29 MED ORDER — ASPIRIN 81 MG PO CHEW: 81.0000 mg | CHEWABLE_TABLET | Freq: Every day | ORAL | Status: DC

## 2023-10-29 MED ORDER — ENOXAPARIN SODIUM 40 MG/0.4ML IJ SOSY
40.0000 mg | PREFILLED_SYRINGE | INTRAMUSCULAR | Status: DC
  Administered 2023-10-29 – 2023-11-10 (×13): 40 mg via SUBCUTANEOUS
  Filled 2023-10-29 (×12): qty 0.4

## 2023-10-29 MED ORDER — HYDROMORPHONE HCL 1 MG/ML IJ SOLN
0.5000 mg | INTRAMUSCULAR | Status: DC | PRN
  Administered 2023-10-30 – 2023-11-04 (×8): 0.5 mg via INTRAVENOUS
  Filled 2023-10-29 (×9): qty 0.5

## 2023-10-29 MED ORDER — SODIUM CHLORIDE 0.9 % IV SOLN: 2.0000 g | Freq: Three times a day (TID) | INTRAVENOUS | Status: DC

## 2023-10-29 MED ORDER — SODIUM CHLORIDE 0.9 % IV SOLN
1.0000 g | Freq: Three times a day (TID) | INTRAVENOUS | Status: AC
  Administered 2023-10-30 – 2023-11-01 (×8): 1 g via INTRAVENOUS
  Filled 2023-10-29 (×10): qty 20

## 2023-10-29 MED ORDER — GLUCOSE 40 % PO GEL
ORAL | Status: AC
  Administered 2023-10-29: 31 g via ORAL
  Filled 2023-10-29: qty 1.21

## 2023-10-29 MED ORDER — ASPIRIN 300 MG RE SUPP
300.0000 mg | Freq: Every day | RECTAL | Status: DC
  Administered 2023-10-29 – 2023-11-05 (×6): 300 mg via RECTAL
  Filled 2023-10-29 (×14): qty 1

## 2023-10-29 MED ORDER — ASPIRIN 81 MG PO TBEC
81.0000 mg | DELAYED_RELEASE_TABLET | Freq: Every day | ORAL | Status: DC
  Administered 2023-11-06 – 2023-11-11 (×6): 81 mg via ORAL
  Filled 2023-10-29 (×8): qty 1

## 2023-10-29 MED ORDER — INSULIN ASPART 100 UNIT/ML IJ SOLN: 0.0000 [IU] | Freq: Four times a day (QID) | INTRAMUSCULAR | Status: DC

## 2023-10-29 MED ORDER — GLUCOSE 40 % PO GEL
1.0000 | ORAL | Status: AC
Start: 2023-10-29 — End: 2023-10-29

## 2023-10-29 MED ORDER — METRONIDAZOLE 500 MG PO TABS
500.0000 mg | ORAL_TABLET | Freq: Two times a day (BID) | ORAL | Status: DC
Start: 2023-10-29 — End: 2023-10-29

## 2023-10-29 MED ORDER — DAPTOMYCIN-SODIUM CHLORIDE 500-0.9 MG/50ML-% IV SOLN
8.0000 mg/kg | Freq: Every day | INTRAVENOUS | Status: DC
  Administered 2023-10-30: 500 mg via INTRAVENOUS
  Filled 2023-10-29 (×3): qty 50

## 2023-10-29 NOTE — Plan of Care (Signed)
  Problem: Coping: Goal: Ability to adjust to condition or change in health will improve Outcome: Progressing   Problem: Fluid Volume: Goal: Ability to maintain a balanced intake and output will improve Outcome: Not Progressing   Problem: Nutritional: Goal: Maintenance of adequate nutrition will improve Outcome: Not Progressing   Problem: Skin Integrity: Goal: Risk for impaired skin integrity will decrease Outcome: Not Progressing

## 2023-10-29 NOTE — Progress Notes (Signed)
 [name] [name] [id number] [facility]  Hospice hospital liaison note   Referral received from [facility] for family interest in [facility].   Met with daughter [name] on 10.13 in patient's room to explain services and hospice philosophy and all questions answered. She said that she would like for the pt to go to [facility].  This morning the pt was approved to go to [facility] and there was a bed available but [name] said that she wanted to speak with the MD and to hold off on transferring him.  [facility] will continue to follow.   Updated attending and [facility] manager via [employer].  Thank you for the opportunity to participate in this patient's care Amy Darien BSN, RN [facility] Liaison [phone]

## 2023-10-29 NOTE — Progress Notes (Signed)
 I learned this afternoon that the family is not wishing to pursue comfort approach anymore, declining transfer to [facility].  I spoke with the patient's daughter and granddaughter, they reiterate that they would want the patient to receive all the treatment that is needed and not focus on comfort alone.  They are concerned about feeding and asking for a G-tube.  I reiterated the patient's advanced disease, has not been ambulatory for the past 11 years and has a wound that will not heal no matter what we do as long as he remains bedbound.  Antibiotics and feeding are very unlikely to heal this wound.  It was my initial understanding in my discussion with the daughter yesterday, that the patient would not have wanted any of this, but today I am hearing something different.  From medical standpoint I recommend comfort based approach, minimizing suffering, as while his life may be prolonged with feeding and medication, his condition will not resolve and he is suffering will only be prolonged. I explained that I will not proceed with G-tube at this point in time, he may have had worsening encephalopathy due to cefepime  which was discontinued 2 days ago, can take few days to wear off, he also had a stroke.  During our conversation I understood that family wishes for a different doctor, and reassured them that they will get a different doctor tomorrow as I will be off.  - Will place a temporary NG tube for medication administration as well as feeding.  RD consult - Neurology consulted again to finish the stroke workup - Placed back on IV antibiotics as per original plan from ID to do 7 days of IV antibiotics followed by 2 weeks of orals - IR consulted again for central line to be able to do blood work and administer antibiotics and urgent meds if needed  Nyan Dufresne M. Trixie, MD, PhD [facility]  Between 7 am - 7 pm you can contact me via Amion (for emergencies) or Securechat (non urgent matters).  I am  not available 7 pm - 7 am, please contact night coverage MD/APP via Amion

## 2023-10-29 NOTE — Plan of Care (Signed)
  Problem: Coping: Goal: Ability to identify and develop effective coping behavior will improve Outcome: Progressing Note: Patient is comfort care.
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
## 2023-10-29 NOTE — Progress Notes (Signed)
 Unable to give patient's IV medications d/t no IV access, awaiting central line placement and coretrek. Patient blood sugar was 69, hypoglycemic protocol initiated, charge nurse notified and PA present at the time of hypoglycemic event. blood sugar after 30 minutes of glucagon gel, blood sugar went to 75. Sacrum wound care completed, dressing changed.
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
## 2023-10-30 ENCOUNTER — Inpatient Hospital Stay (HOSPITAL_COMMUNITY)

## 2023-10-30 DIAGNOSIS — A419 Sepsis, unspecified organism: Secondary | ICD-10-CM | POA: Diagnosis not present

## 2023-10-30 DIAGNOSIS — G2401 Drug induced subacute dyskinesia: Secondary | ICD-10-CM

## 2023-10-30 DIAGNOSIS — I6389 Other cerebral infarction: Secondary | ICD-10-CM | POA: Diagnosis not present

## 2023-10-30 DIAGNOSIS — Z515 Encounter for palliative care: Secondary | ICD-10-CM | POA: Diagnosis not present

## 2023-10-30 DIAGNOSIS — I959 Hypotension, unspecified: Secondary | ICD-10-CM | POA: Diagnosis not present

## 2023-10-30 DIAGNOSIS — E1151 Type 2 diabetes mellitus with diabetic peripheral angiopathy without gangrene: Secondary | ICD-10-CM | POA: Diagnosis not present

## 2023-10-30 DIAGNOSIS — I69391 Dysphagia following cerebral infarction: Secondary | ICD-10-CM | POA: Diagnosis not present

## 2023-10-30 DIAGNOSIS — D649 Anemia, unspecified: Secondary | ICD-10-CM | POA: Diagnosis not present

## 2023-10-30 DIAGNOSIS — E43 Unspecified severe protein-calorie malnutrition: Secondary | ICD-10-CM | POA: Insufficient documentation

## 2023-10-30 DIAGNOSIS — M4628 Osteomyelitis of vertebra, sacral and sacrococcygeal region: Secondary | ICD-10-CM | POA: Diagnosis not present

## 2023-10-30 HISTORY — PX: IR TUNNELED CENTRAL VENOUS CATH PLC W IMG: IMG1939

## 2023-10-30 LAB — COMPREHENSIVE METABOLIC PANEL WITH GFR
ALT: 9 U/L (ref 0–44)
AST: 16 U/L (ref 15–41)
Albumin: 1.6 g/dL — ABNORMAL LOW (ref 3.5–5.0)
Alkaline Phosphatase: 74 U/L (ref 38–126)
Anion gap: 12 (ref 5–15)
BUN: 49 mg/dL — ABNORMAL HIGH (ref 8–23)
CO2: 13 mmol/L — ABNORMAL LOW (ref 22–32)
Calcium: 8.5 mg/dL — ABNORMAL LOW (ref 8.9–10.3)
Chloride: 115 mmol/L — ABNORMAL HIGH (ref 98–111)
Creatinine, Ser: 1.15 mg/dL (ref 0.61–1.24)
GFR, Estimated: >60 mL/min (ref >60)
Glucose, Bld: 154 mg/dL — ABNORMAL HIGH (ref 70–99)
Potassium: 4.4 mmol/L (ref 3.5–5.1)
Sodium: 140 mmol/L (ref 135–145)
Total Bilirubin: 1.2 mg/dL (ref 0.0–1.2)
Total Protein: 6.3 g/dL — ABNORMAL LOW (ref 6.5–8.1)

## 2023-10-30 LAB — GLUCOSE, CAPILLARY
Glucose-Capillary: 106 mg/dL — ABNORMAL HIGH (ref 70–99)
Glucose-Capillary: 121 mg/dL — ABNORMAL HIGH (ref 70–99)
Glucose-Capillary: 124 mg/dL — ABNORMAL HIGH (ref 70–99)
Glucose-Capillary: 160 mg/dL — ABNORMAL HIGH (ref 70–99)
Glucose-Capillary: 62 mg/dL — ABNORMAL LOW (ref 70–99)
Glucose-Capillary: 64 mg/dL — ABNORMAL LOW (ref 70–99)
Glucose-Capillary: 67 mg/dL — ABNORMAL LOW (ref 70–99)
Glucose-Capillary: 92 mg/dL (ref 70–99)
Glucose-Capillary: 92 mg/dL (ref 70–99)

## 2023-10-30 LAB — CK: Total CK: 82 U/L (ref 49–397)

## 2023-10-30 LAB — CBC
HCT: 32.5 % — ABNORMAL LOW (ref 39.0–52.0)
Hemoglobin: 10.1 g/dL — ABNORMAL LOW (ref 13.0–17.0)
MCH: 27.9 pg (ref 26.0–34.0)
MCHC: 31.1 g/dL (ref 30.0–36.0)
MCV: 89.8 fL (ref 80.0–100.0)
Platelets: 409 K/uL — ABNORMAL HIGH (ref 150–400)
RBC: 3.62 MIL/uL — ABNORMAL LOW (ref 4.22–5.81)
RDW: 16.4 % — ABNORMAL HIGH (ref 11.5–15.5)
WBC: 15.7 K/uL — ABNORMAL HIGH (ref 4.0–10.5)
nRBC: 0 % (ref 0.0–0.2)

## 2023-10-30 LAB — MAGNESIUM: Magnesium: 2 mg/dL (ref 1.7–2.4)

## 2023-10-30 MED ORDER — DEXTROSE 50 % IV SOLN
INTRAVENOUS | Status: AC
  Administered 2023-10-30: 50 mL
  Filled 2023-10-30: qty 50

## 2023-10-30 MED ORDER — ADULT MULTIVITAMIN W/MINERALS CH
1.0000 | ORAL_TABLET | Freq: Every day | ORAL | Status: DC
  Administered 2023-10-30 – 2023-12-02 (×34): 1
  Filled 2023-10-30 (×34): qty 1

## 2023-10-30 MED ORDER — LIDOCAINE-EPINEPHRINE 1 %-1:100000 IJ SOLN
20.0000 mL | Freq: Once | INTRAMUSCULAR | Status: DC
Start: 2023-10-30 — End: 2023-11-04
  Filled 2023-10-30: qty 20

## 2023-10-30 MED ORDER — THIAMINE MONONITRATE 100 MG PO TABS
100.0000 mg | ORAL_TABLET | Freq: Every day | ORAL | Status: AC
  Administered 2023-10-30 – 2023-11-05 (×7): 100 mg
  Filled 2023-10-30 (×7): qty 1

## 2023-10-30 MED ORDER — VITAMIN C 500 MG PO TABS
500.0000 mg | ORAL_TABLET | Freq: Every day | ORAL | Status: AC
  Administered 2023-10-30 – 2023-11-28 (×30): 500 mg
  Filled 2023-10-30 (×30): qty 1

## 2023-10-30 MED ORDER — GLUCOSE 40 % PO GEL
1.0000 | Freq: Once | ORAL | Status: DC | PRN
  Administered 2023-10-30: 31 g via ORAL
  Filled 2023-10-30: qty 1.21

## 2023-10-30 MED ORDER — ZINC SULFATE 220 (50 ZN) MG PO CAPS
220.0000 mg | ORAL_CAPSULE | Freq: Every day | ORAL | Status: AC
  Administered 2023-10-30 – 2023-11-13 (×15): 220 mg
  Filled 2023-10-30 (×15): qty 1

## 2023-10-30 MED ORDER — IOHEXOL 350 MG/ML SOLN
75.0000 mL | Freq: Once | INTRAVENOUS | Status: AC | PRN
  Administered 2023-10-30: 75 mL via INTRAVENOUS

## 2023-10-30 MED ORDER — PROSOURCE TF20 ENFIT COMPATIBL EN LIQD
60.0000 mL | Freq: Two times a day (BID) | ENTERAL | Status: DC
  Administered 2023-10-30 – 2023-11-16 (×34): 60 mL
  Filled 2023-10-30 (×32): qty 60

## 2023-10-30 MED ORDER — LIDOCAINE-EPINEPHRINE 1 %-1:100000 IJ SOLN
INTRAMUSCULAR | Status: AC
  Filled 2023-10-30: qty 1

## 2023-10-30 MED ORDER — GLUCAGON HCL RDNA (DIAGNOSTIC) 1 MG IJ SOLR
1.0000 mg | Freq: Once | INTRAMUSCULAR | Status: AC
  Administered 2023-10-30: 1 mg via INTRAVENOUS
  Filled 2023-10-30: qty 1

## 2023-10-30 MED ORDER — JEVITY 1.5 CAL/FIBER PO LIQD
1000.0000 mL | ORAL | Status: DC
  Administered 2023-10-30 – 2023-11-11 (×6): 1000 mL
  Filled 2023-10-30 (×24): qty 1000

## 2023-10-30 MED ORDER — VITAL HP 1.0 CAL PO LIQD: 1000.0000 mL | ORAL | Status: DC

## 2023-10-30 NOTE — Procedures (Signed)
 Cortrak  Person Inserting Tube:  Elihue Josette RAMAN, RD Tube Type:  Cortrak - 43 inches Tube Size:  10 Tube Location:  Left nare Secured by: Bridle Initial Placement:  Gastric Technique Used to Measure Tube Placement:  Marking at nare/corner of mouth Cortrak Secured At:  60 cm Initial Placement Verification:  Xray  Cortrak Tube Team Note:  Consult received to place a Cortrak feeding tube.   Xray ordered to confirm placement. Once confirmed, can be used.   If the tube becomes dislodged please keep the tube and contact the Cortrak team at [email or web address] for replacement.  If after hours and replacement cannot be delayed, place a NG tube and confirm placement with an abdominal x-ray.    Josette Elihue, MS, RDN, LDN Clinical Dietitian I Please reach out via secure chat

## 2023-10-30 NOTE — Plan of Care (Signed)
  Problem: Metabolic: Goal: Ability to maintain appropriate glucose levels will improve Outcome: Progressing   Problem: Skin Integrity: Goal: Risk for impaired skin integrity will decrease Outcome: Not Progressing   Problem: Clinical Measurements: Goal: Cardiovascular complication will be avoided Outcome: Progressing   Problem: Activity: Goal: Risk for activity intolerance will decrease Outcome: Not Progressing

## 2023-10-30 NOTE — Plan of Care (Signed)
   Problem: Coping: Goal: Ability to adjust to condition or change in health will improve Outcome: Progressing   Problem: Skin Integrity: Goal: Risk for impaired skin integrity will decrease Outcome: Progressing

## 2023-10-30 NOTE — Progress Notes (Addendum)
 Hypoglycemic Event  CBG: 64  Treatment: 1 tube glucose gel  Symptoms: None  Follow-up CBG: Time:0240 CBG Result:62  Possible Reasons for Event: Inadequate meal intake  Comments/MD notified:[name], NP notified/ IV team consulted for access. New order for Glucagon Injection.    [name] [name]
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
## 2023-10-30 NOTE — Progress Notes (Signed)
 Pt return from IR in bed. Pt is alert to self. Central line place in right upper chest with green caps. Call button in reach, bed in lowest position and floor mats down.
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
## 2023-10-30 NOTE — Progress Notes (Signed)
 Consult for PIV placement. CVC recommended by multiple [facility] staff after finding no suitable veins for placement. If further IV access desired, central line recommended.

## 2023-10-30 NOTE — Procedures (Signed)
 Vascular and Interventional Radiology Procedure Note  Patient: [name] [name] DOB: 18-Mar-1945 Medical Record Number: [id number] Note Date/Time: 10/30/23 9:26 AM   Performing Physician: Thom Hall, MD Assistant(s): None  Diagnosis: Poor IV access  Procedure: TUNNELED CENTRAL VENOUS (PowerLine) CATHETER PLACEMENT  Anesthesia: Local Anesthetic Complications: None Estimated Blood Loss: Minimal Specimens:  None  Findings:  Successful placement of right-sided, 24 cm (tip-to-cuff), tunneled DL PowerLine catheter with the tip of the catheter in the proximal right atrium.  Plan: Catheter ready for use.  See detailed procedure note with images in PACS. The patient tolerated the procedure well without incident or complication and was returned to Recovery in stable condition.    Thom Hall, MD [facility] Radiology   Pager. [phone] Clinic. [phone]

## 2023-10-30 NOTE — Progress Notes (Signed)
 Started pt Jevity 1.5 at 86mL/hr, flush 30 mL of water, pt tolerating well.  Pt need to be suction, tolerated fair.

## 2023-10-30 NOTE — Progress Notes (Addendum)
 Hypoglycemic Event  CBG: 62  Treatment: Glucagon IM 1 mg  Symptoms: None  Follow-up CBG: Time:0323 CBG Result:67  Possible Reasons for Event: Inadequate meal intake  Comments/MD notified:James Blondie, NP 50% dextrose  25 MG administered.    Hadassah littie Deters
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
## 2023-10-31 DIAGNOSIS — I959 Hypotension, unspecified: Secondary | ICD-10-CM | POA: Diagnosis not present

## 2023-10-31 DIAGNOSIS — I69391 Dysphagia following cerebral infarction: Secondary | ICD-10-CM | POA: Diagnosis not present

## 2023-10-31 DIAGNOSIS — E1151 Type 2 diabetes mellitus with diabetic peripheral angiopathy without gangrene: Secondary | ICD-10-CM | POA: Diagnosis not present

## 2023-10-31 DIAGNOSIS — I6389 Other cerebral infarction: Secondary | ICD-10-CM | POA: Diagnosis not present

## 2023-10-31 DIAGNOSIS — A419 Sepsis, unspecified organism: Secondary | ICD-10-CM | POA: Diagnosis not present

## 2023-10-31 LAB — GLUCOSE, CAPILLARY
Glucose-Capillary: 123 mg/dL — ABNORMAL HIGH (ref 70–99)
Glucose-Capillary: 120 mg/dL — ABNORMAL HIGH (ref 70–99)
Glucose-Capillary: 143 mg/dL — ABNORMAL HIGH (ref 70–99)
Glucose-Capillary: 159 mg/dL — ABNORMAL HIGH (ref 70–99)
Glucose-Capillary: 190 mg/dL — ABNORMAL HIGH (ref 70–99)

## 2023-10-31 LAB — RENAL FUNCTION PANEL
Albumin: 1.6 g/dL — ABNORMAL LOW (ref 3.5–5.0)
Anion gap: 12 (ref 5–15)
BUN: 52 mg/dL — ABNORMAL HIGH (ref 8–23)
CO2: 14 mmol/L — ABNORMAL LOW (ref 22–32)
Calcium: 8.4 mg/dL — ABNORMAL LOW (ref 8.9–10.3)
Chloride: 115 mmol/L — ABNORMAL HIGH (ref 98–111)
Creatinine, Ser: 1.16 mg/dL (ref 0.61–1.24)
GFR, Estimated: >60 mL/min (ref >60)
Glucose, Bld: 138 mg/dL — ABNORMAL HIGH (ref 70–99)
Phosphorus: 2.8 mg/dL (ref 2.5–4.6)
Potassium: 5.9 mmol/L — ABNORMAL HIGH (ref 3.5–5.1)
Sodium: 141 mmol/L (ref 135–145)

## 2023-10-31 LAB — CBC
HCT: 31.1 % — ABNORMAL LOW (ref 39.0–52.0)
Hemoglobin: 9.4 g/dL — ABNORMAL LOW (ref 13.0–17.0)
MCH: 27.3 pg (ref 26.0–34.0)
MCHC: 30.2 g/dL (ref 30.0–36.0)
MCV: 90.4 fL (ref 80.0–100.0)
Platelets: 353 K/uL (ref 150–400)
RBC: 3.44 MIL/uL — ABNORMAL LOW (ref 4.22–5.81)
RDW: 17 % — ABNORMAL HIGH (ref 11.5–15.5)
WBC: 11.1 K/uL — ABNORMAL HIGH (ref 4.0–10.5)
nRBC: 0.2 % (ref 0.0–0.2)

## 2023-10-31 LAB — MAGNESIUM: Magnesium: 2 mg/dL (ref 1.7–2.4)

## 2023-10-31 MED ORDER — DAPTOMYCIN-SODIUM CHLORIDE 500-0.9 MG/50ML-% IV SOLN
8.0000 mg/kg | Freq: Every day | INTRAVENOUS | Status: AC
  Administered 2023-10-31 – 2023-11-01 (×2): 500 mg via INTRAVENOUS
  Filled 2023-10-31 (×2): qty 50

## 2023-10-31 MED ORDER — CHLORHEXIDINE GLUCONATE CLOTH 2 % EX PADS
6.0000 | MEDICATED_PAD | Freq: Every day | CUTANEOUS | Status: DC
  Administered 2023-10-31 – 2023-12-02 (×32): 6 via TOPICAL

## 2023-10-31 MED ORDER — DOXYCYCLINE HYCLATE 100 MG PO TABS
100.0000 mg | ORAL_TABLET | Freq: Two times a day (BID) | ORAL | Status: DC
  Filled 2023-10-31: qty 1

## 2023-10-31 MED ORDER — AMOXICILLIN-POT CLAVULANATE 875-125 MG PO TABS
1.0000 | ORAL_TABLET | Freq: Two times a day (BID) | ORAL | Status: DC
  Filled 2023-10-31 (×2): qty 1

## 2023-10-31 MED ORDER — FUROSEMIDE 10 MG/ML IJ SOLN
40.0000 mg | Freq: Once | INTRAMUSCULAR | Status: AC
  Administered 2023-10-31: 40 mg via INTRAVENOUS
  Filled 2023-10-31: qty 4
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
## 2023-10-31 NOTE — Plan of Care (Signed)
  Problem: Nutrition: Goal: Risk of aspiration will decrease Outcome: Progressing   Problem: Pain Managment: Goal: General experience of comfort will improve and/or be controlled Outcome: Progressing   Problem: Safety: Goal: Ability to remain free from injury will improve Outcome: Progressing

## 2023-10-31 NOTE — Plan of Care (Signed)
  Problem: Nutritional: Goal: Maintenance of adequate nutrition will improve Outcome: Progressing   Problem: Skin Integrity: Goal: Risk for impaired skin integrity will decrease Outcome: Progressing   Problem: Coping: Goal: Level of anxiety will decrease Outcome: Progressing   Problem: Pain Managment: Goal: General experience of comfort will improve and/or be controlled Outcome: Progressing   Problem: Safety: Goal: Ability to remain free from injury will improve Outcome: Progressing   Problem: Skin Integrity: Goal: Risk for impaired skin integrity will decrease Outcome: Progressing
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
## 2023-11-01 DIAGNOSIS — M4628 Osteomyelitis of vertebra, sacral and sacrococcygeal region: Secondary | ICD-10-CM | POA: Diagnosis not present

## 2023-11-01 DIAGNOSIS — D649 Anemia, unspecified: Secondary | ICD-10-CM | POA: Diagnosis not present

## 2023-11-01 DIAGNOSIS — A419 Sepsis, unspecified organism: Secondary | ICD-10-CM | POA: Diagnosis not present

## 2023-11-01 DIAGNOSIS — Z515 Encounter for palliative care: Secondary | ICD-10-CM | POA: Diagnosis not present

## 2023-11-01 LAB — RENAL FUNCTION PANEL
Albumin: <1.5 g/dL — ABNORMAL LOW (ref 3.5–5.0)
Anion gap: 7 (ref 5–15)
BUN: 55 mg/dL — ABNORMAL HIGH (ref 8–23)
CO2: 20 mmol/L — ABNORMAL LOW (ref 22–32)
Calcium: 8.1 mg/dL — ABNORMAL LOW (ref 8.9–10.3)
Chloride: 115 mmol/L — ABNORMAL HIGH (ref 98–111)
Creatinine, Ser: 0.88 mg/dL (ref 0.61–1.24)
GFR, Estimated: >60 mL/min (ref >60)
Glucose, Bld: 210 mg/dL — ABNORMAL HIGH (ref 70–99)
Phosphorus: 2.2 mg/dL — ABNORMAL LOW (ref 2.5–4.6)
Potassium: 4 mmol/L (ref 3.5–5.1)
Sodium: 142 mmol/L (ref 135–145)

## 2023-11-01 LAB — CBC
HCT: 28.3 % — ABNORMAL LOW (ref 39.0–52.0)
Hemoglobin: 8.7 g/dL — ABNORMAL LOW (ref 13.0–17.0)
MCH: 27.7 pg (ref 26.0–34.0)
MCHC: 30.7 g/dL (ref 30.0–36.0)
MCV: 90.1 fL (ref 80.0–100.0)
Platelets: 329 K/uL (ref 150–400)
RBC: 3.14 MIL/uL — ABNORMAL LOW (ref 4.22–5.81)
RDW: 17.1 % — ABNORMAL HIGH (ref 11.5–15.5)
WBC: 11.2 K/uL — ABNORMAL HIGH (ref 4.0–10.5)
nRBC: 0.4 % — ABNORMAL HIGH (ref 0.0–0.2)

## 2023-11-01 LAB — GLUCOSE, CAPILLARY
Glucose-Capillary: 164 mg/dL — ABNORMAL HIGH (ref 70–99)
Glucose-Capillary: 188 mg/dL — ABNORMAL HIGH (ref 70–99)
Glucose-Capillary: 198 mg/dL — ABNORMAL HIGH (ref 70–99)
Glucose-Capillary: 199 mg/dL — ABNORMAL HIGH (ref 70–99)
Glucose-Capillary: 221 mg/dL — ABNORMAL HIGH (ref 70–99)
Glucose-Capillary: 222 mg/dL — ABNORMAL HIGH (ref 70–99)
Glucose-Capillary: 231 mg/dL — ABNORMAL HIGH (ref 70–99)

## 2023-11-01 LAB — MAGNESIUM: Magnesium: 1.9 mg/dL (ref 1.7–2.4)

## 2023-11-01 MED ORDER — SODIUM CHLORIDE 0.9% FLUSH: 10.0000 mL | INTRAVENOUS | Status: DC | PRN

## 2023-11-01 NOTE — Plan of Care (Signed)
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
## 2023-11-02 DIAGNOSIS — M4628 Osteomyelitis of vertebra, sacral and sacrococcygeal region: Secondary | ICD-10-CM | POA: Diagnosis not present

## 2023-11-02 DIAGNOSIS — Z515 Encounter for palliative care: Secondary | ICD-10-CM | POA: Diagnosis not present

## 2023-11-02 DIAGNOSIS — D649 Anemia, unspecified: Secondary | ICD-10-CM | POA: Diagnosis not present

## 2023-11-02 DIAGNOSIS — A419 Sepsis, unspecified organism: Secondary | ICD-10-CM | POA: Diagnosis not present

## 2023-11-02 LAB — RENAL FUNCTION PANEL
Albumin: <1.5 g/dL — ABNORMAL LOW (ref 3.5–5.0)
Anion gap: 9 (ref 5–15)
BUN: 59 mg/dL — ABNORMAL HIGH (ref 8–23)
CO2: 20 mmol/L — ABNORMAL LOW (ref 22–32)
Calcium: 8.1 mg/dL — ABNORMAL LOW (ref 8.9–10.3)
Chloride: 113 mmol/L — ABNORMAL HIGH (ref 98–111)
Creatinine, Ser: 0.89 mg/dL (ref 0.61–1.24)
GFR, Estimated: >60 mL/min (ref >60)
Glucose, Bld: 275 mg/dL — ABNORMAL HIGH (ref 70–99)
Phosphorus: 2.1 mg/dL — ABNORMAL LOW (ref 2.5–4.6)
Potassium: 4.2 mmol/L (ref 3.5–5.1)
Sodium: 142 mmol/L (ref 135–145)

## 2023-11-02 LAB — GLUCOSE, CAPILLARY
Glucose-Capillary: 289 mg/dL — ABNORMAL HIGH (ref 70–99)
Glucose-Capillary: 266 mg/dL — ABNORMAL HIGH (ref 70–99)
Glucose-Capillary: 270 mg/dL — ABNORMAL HIGH (ref 70–99)
Glucose-Capillary: 308 mg/dL — ABNORMAL HIGH (ref 70–99)
Glucose-Capillary: 315 mg/dL — ABNORMAL HIGH (ref 70–99)
Glucose-Capillary: 308 mg/dL — ABNORMAL HIGH (ref 70–99)

## 2023-11-02 LAB — CBC
HCT: 28.7 % — ABNORMAL LOW (ref 39.0–52.0)
Hemoglobin: 8.8 g/dL — ABNORMAL LOW (ref 13.0–17.0)
MCH: 27.6 pg (ref 26.0–34.0)
MCHC: 30.7 g/dL (ref 30.0–36.0)
MCV: 90 fL (ref 80.0–100.0)
Platelets: 317 K/uL (ref 150–400)
RBC: 3.19 MIL/uL — ABNORMAL LOW (ref 4.22–5.81)
RDW: 16.9 % — ABNORMAL HIGH (ref 11.5–15.5)
WBC: 11.1 K/uL — ABNORMAL HIGH (ref 4.0–10.5)
nRBC: 0.4 % — ABNORMAL HIGH (ref 0.0–0.2)

## 2023-11-02 LAB — MAGNESIUM: Magnesium: 1.9 mg/dL (ref 1.7–2.4)

## 2023-11-02 MED ORDER — INSULIN ASPART 100 UNIT/ML IJ SOLN: 0.0000 [IU] | INTRAMUSCULAR | Status: DC

## 2023-11-02 MED ORDER — AMOXICILLIN-POT CLAVULANATE 400-57 MG/5ML PO SUSR
875.0000 mg | Freq: Two times a day (BID) | ORAL | Status: DC
  Filled 2023-11-02 (×2): qty 15

## 2023-11-02 MED ORDER — AMOXICILLIN-POT CLAVULANATE 400-57 MG/5ML PO SUSR
875.0000 mg | Freq: Two times a day (BID) | ORAL | Status: AC
Start: 2023-11-02 — End: 2023-11-08
  Administered 2023-11-03 – 2023-11-08 (×13): 875 mg
  Filled 2023-11-02: qty 10.94
  Filled 2023-11-02: qty 15
  Filled 2023-11-02 (×3): qty 10.94
  Filled 2023-11-02: qty 15
  Filled 2023-11-02 (×3): qty 10.94
  Filled 2023-11-02: qty 15
  Filled 2023-11-02: qty 10.94
  Filled 2023-11-02: qty 15
  Filled 2023-11-02: qty 10.94
  Filled 2023-11-02: qty 15

## 2023-11-02 MED ORDER — DOXYCYCLINE MONOHYDRATE 25 MG/5ML PO SUSR
100.0000 mg | Freq: Two times a day (BID) | ORAL | Status: DC
  Filled 2023-11-02 (×2): qty 20

## 2023-11-02 MED ORDER — INSULIN ASPART 100 UNIT/ML IJ SOLN
0.0000 [IU] | INTRAMUSCULAR | Status: DC
  Administered 2023-11-02: 3 [IU] via SUBCUTANEOUS

## 2023-11-02 MED ORDER — FREE WATER
200.0000 mL | Freq: Four times a day (QID) | Status: DC
  Administered 2023-11-02 – 2023-11-28 (×104): 200 mL

## 2023-11-02 MED ORDER — DOXYCYCLINE MONOHYDRATE 25 MG/5ML PO SUSR
100.0000 mg | Freq: Two times a day (BID) | ORAL | Status: AC
  Administered 2023-11-02 – 2023-11-08 (×13): 100 mg
  Filled 2023-11-02 (×13): qty 20

## 2023-11-02 MED ADMIN — Insulin Aspart Inj Soln 100 Unit/ML: 4 [IU] | SUBCUTANEOUS | NDC 73070010011
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
## 2023-11-02 NOTE — Progress Notes (Signed)
 Made provider aware of 198 CBG. No new orders.

## 2023-11-02 NOTE — Plan of Care (Signed)

## 2023-11-02 NOTE — Progress Notes (Signed)
 Made provider aware of 289 CBG, see new orders SSI.

## 2023-11-03 DIAGNOSIS — A419 Sepsis, unspecified organism: Secondary | ICD-10-CM | POA: Diagnosis not present

## 2023-11-03 LAB — RENAL FUNCTION PANEL
Albumin: <1.5 g/dL — ABNORMAL LOW (ref 3.5–5.0)
Anion gap: 8 (ref 5–15)
BUN: 63 mg/dL — ABNORMAL HIGH (ref 8–23)
CO2: 20 mmol/L — ABNORMAL LOW (ref 22–32)
Calcium: 8 mg/dL — ABNORMAL LOW (ref 8.9–10.3)
Chloride: 112 mmol/L — ABNORMAL HIGH (ref 98–111)
Creatinine, Ser: 0.87 mg/dL (ref 0.61–1.24)
GFR, Estimated: >60 mL/min (ref >60)
Glucose, Bld: 407 mg/dL — ABNORMAL HIGH (ref 70–99)
Phosphorus: 1.1 mg/dL — ABNORMAL LOW (ref 2.5–4.6)
Potassium: 4.6 mmol/L (ref 3.5–5.1)
Sodium: 140 mmol/L (ref 135–145)

## 2023-11-03 LAB — CBC
HCT: 27.3 % — ABNORMAL LOW (ref 39.0–52.0)
Hemoglobin: 8.4 g/dL — ABNORMAL LOW (ref 13.0–17.0)
MCH: 27.5 pg (ref 26.0–34.0)
MCHC: 30.8 g/dL (ref 30.0–36.0)
MCV: 89.2 fL (ref 80.0–100.0)
Platelets: 266 K/uL (ref 150–400)
RBC: 3.06 MIL/uL — ABNORMAL LOW (ref 4.22–5.81)
RDW: 16.9 % — ABNORMAL HIGH (ref 11.5–15.5)
WBC: 10.1 K/uL (ref 4.0–10.5)
nRBC: 0.8 % — ABNORMAL HIGH (ref 0.0–0.2)

## 2023-11-03 LAB — GLUCOSE, CAPILLARY
Glucose-Capillary: 325 mg/dL — ABNORMAL HIGH (ref 70–99)
Glucose-Capillary: 297 mg/dL — ABNORMAL HIGH (ref 70–99)
Glucose-Capillary: 269 mg/dL — ABNORMAL HIGH (ref 70–99)
Glucose-Capillary: 266 mg/dL — ABNORMAL HIGH (ref 70–99)
Glucose-Capillary: 333 mg/dL — ABNORMAL HIGH (ref 70–99)
Glucose-Capillary: 310 mg/dL — ABNORMAL HIGH (ref 70–99)

## 2023-11-03 MED ORDER — K PHOS MONO-SOD PHOS DI & MONO 155-852-130 MG PO TABS
500.0000 mg | ORAL_TABLET | Freq: Four times a day (QID) | ORAL | Status: AC
  Administered 2023-11-03 (×4): 500 mg
  Filled 2023-11-03 (×4): qty 2

## 2023-11-03 MED ORDER — INSULIN ASPART 100 UNIT/ML IJ SOLN
0.0000 [IU] | INTRAMUSCULAR | Status: DC
  Administered 2023-11-03: 5 [IU] via SUBCUTANEOUS
  Administered 2023-11-03: 7 [IU] via SUBCUTANEOUS
  Administered 2023-11-03 (×2): 5 [IU] via SUBCUTANEOUS
  Administered 2023-11-03: 7 [IU] via SUBCUTANEOUS
  Administered 2023-11-04: 3 [IU] via SUBCUTANEOUS
  Administered 2023-11-04: 5 [IU] via SUBCUTANEOUS
  Administered 2023-11-04 (×2): 3 [IU] via SUBCUTANEOUS
  Administered 2023-11-04: 5 [IU] via SUBCUTANEOUS
  Administered 2023-11-05: 3 [IU] via SUBCUTANEOUS
  Administered 2023-11-05: 5 [IU] via SUBCUTANEOUS
  Administered 2023-11-05 (×4): 2 [IU] via SUBCUTANEOUS
  Administered 2023-11-06: 3 [IU] via SUBCUTANEOUS
  Administered 2023-11-06: 5 [IU] via SUBCUTANEOUS
  Administered 2023-11-06: 1 [IU] via SUBCUTANEOUS
  Administered 2023-11-06 (×3): 2 [IU] via SUBCUTANEOUS
  Administered 2023-11-07: 3 [IU] via SUBCUTANEOUS
  Administered 2023-11-07: 5 [IU] via SUBCUTANEOUS
  Administered 2023-11-07 (×2): 2 [IU] via SUBCUTANEOUS
  Administered 2023-11-07 (×2): 1 [IU] via SUBCUTANEOUS
  Administered 2023-11-08: 5 [IU] via SUBCUTANEOUS
  Administered 2023-11-08 (×2): 2 [IU] via SUBCUTANEOUS
  Administered 2023-11-08 (×3): 3 [IU] via SUBCUTANEOUS
  Administered 2023-11-09: 5 [IU] via SUBCUTANEOUS
  Administered 2023-11-09 (×2): 3 [IU] via SUBCUTANEOUS
  Administered 2023-11-09 (×2): 7 [IU] via SUBCUTANEOUS
  Administered 2023-11-09 – 2023-11-10 (×3): 3 [IU] via SUBCUTANEOUS
  Administered 2023-11-10: 5 [IU] via SUBCUTANEOUS
  Administered 2023-11-10 (×3): 2 [IU] via SUBCUTANEOUS
  Administered 2023-11-10 – 2023-11-11 (×3): 3 [IU] via SUBCUTANEOUS
  Administered 2023-11-11: 2 [IU] via SUBCUTANEOUS
  Administered 2023-11-11 – 2023-11-12 (×3): 3 [IU] via SUBCUTANEOUS
  Administered 2023-11-12: 2 [IU] via SUBCUTANEOUS
  Administered 2023-11-12 (×2): 3 [IU] via SUBCUTANEOUS
  Administered 2023-11-12 (×2): 2 [IU] via SUBCUTANEOUS
  Administered 2023-11-13: 3 [IU] via SUBCUTANEOUS
  Administered 2023-11-13: 2 [IU] via SUBCUTANEOUS
  Administered 2023-11-13: 3 [IU] via SUBCUTANEOUS
  Administered 2023-11-13: 2 [IU] via SUBCUTANEOUS
  Administered 2023-11-14: 5 [IU] via SUBCUTANEOUS
  Administered 2023-11-14: 3 [IU] via SUBCUTANEOUS
  Administered 2023-11-14 (×2): 5 [IU] via SUBCUTANEOUS
  Administered 2023-11-14 – 2023-11-15 (×2): 3 [IU] via SUBCUTANEOUS
  Administered 2023-11-15: 2 [IU] via SUBCUTANEOUS
  Administered 2023-11-15: 3 [IU] via SUBCUTANEOUS
  Administered 2023-11-15: 7 [IU] via SUBCUTANEOUS
  Administered 2023-11-15: 5 [IU] via SUBCUTANEOUS
  Administered 2023-11-15: 2 [IU] via SUBCUTANEOUS
  Administered 2023-11-15: 3 [IU] via SUBCUTANEOUS
  Administered 2023-11-16: 5 [IU] via SUBCUTANEOUS
  Filled 2023-11-03 (×2): qty 3

## 2023-11-03 MED ORDER — INSULIN ASPART 100 UNIT/ML IJ SOLN: 0.0000 [IU] | INTRAMUSCULAR | Status: DC

## 2023-11-03 MED ORDER — INSULIN ASPART 100 UNIT/ML IJ SOLN
4.0000 [IU] | Freq: Once | INTRAMUSCULAR | Status: AC
  Administered 2023-11-03: 4 [IU] via SUBCUTANEOUS

## 2023-11-03 NOTE — Plan of Care (Signed)
 Problem: Education: Goal: Ability to describe self-care measures that may prevent or decrease complications (Diabetes Survival Skills Education) will improve 11/03/2023 2046 by Denyce Harr K, RN Outcome: Progressing 11/03/2023 2046 by Florian Dellis POUR, RN Outcome: Progressing Goal: Individualized Educational Video(s) 11/03/2023 2046 by Florian Dellis POUR, RN Outcome: Progressing 11/03/2023 2046 by Florian Dellis POUR, RN Outcome: Progressing   Problem: Coping: Goal: Ability to adjust to condition or change in health will improve 11/03/2023 2046 by Florian Dellis POUR, RN Outcome: Progressing 11/03/2023 2046 by Florian Dellis POUR, RN Outcome: Progressing   Problem: Fluid Volume: Goal: Ability to maintain a balanced intake and output will improve 11/03/2023 2046 by Florian Dellis POUR, RN Outcome: Progressing 11/03/2023 2046 by Florian Dellis POUR, RN Outcome: Progressing   Problem: Health Behavior/Discharge Planning: Goal: Ability to identify and utilize available resources and services will improve 11/03/2023 2046 by Florian Dellis POUR, RN Outcome: Progressing 11/03/2023 2046 by Florian Dellis POUR, RN Outcome: Progressing Goal: Ability to manage health-related needs will improve 11/03/2023 2046 by Florian Dellis POUR, RN Outcome: Progressing 11/03/2023 2046 by Florian Dellis POUR, RN Outcome: Progressing   Problem: Metabolic: Goal: Ability to maintain appropriate glucose levels will improve 11/03/2023 2046 by Florian Dellis POUR, RN Outcome: Progressing 11/03/2023 2046 by Florian Dellis POUR, RN Outcome: Progressing   Problem: Nutritional: Goal: Maintenance of adequate nutrition will improve 11/03/2023 2046 by Florian Dellis POUR, RN Outcome: Progressing 11/03/2023 2046 by Florian Dellis POUR, RN Outcome: Progressing Goal: Progress toward achieving an optimal weight will improve 11/03/2023 2046 by Florian Dellis POUR, RN Outcome: Progressing 11/03/2023 2046 by Florian Dellis POUR, RN Outcome:  Progressing   Problem: Skin Integrity: Goal: Risk for impaired skin integrity will decrease 11/03/2023 2046 by Florian Dellis POUR, RN Outcome: Progressing 11/03/2023 2046 by Florian Dellis POUR, RN Outcome: Progressing   Problem: Tissue Perfusion: Goal: Adequacy of tissue perfusion will improve 11/03/2023 2046 by Florian Dellis POUR, RN Outcome: Progressing 11/03/2023 2046 by Florian Dellis POUR, RN Outcome: Progressing   Problem: Education: Goal: Knowledge of General Education information will improve Description: Including pain rating scale, medication(s)/side effects and non-pharmacologic comfort measures 11/03/2023 2046 by Hubbert Landrigan K, RN Outcome: Progressing 11/03/2023 2046 by Florian Dellis POUR, RN Outcome: Progressing   Problem: Health Behavior/Discharge Planning: Goal: Ability to manage health-related needs will improve 11/03/2023 2046 by Florian Dellis POUR, RN Outcome: Progressing 11/03/2023 2046 by Florian Dellis POUR, RN Outcome: Progressing   Problem: Clinical Measurements: Goal: Ability to maintain clinical measurements within normal limits will improve 11/03/2023 2046 by Florian Dellis POUR, RN Outcome: Progressing 11/03/2023 2046 by Marjory Meints K, RN Outcome: Progressing Goal: Will remain free from infection 11/03/2023 2046 by Florian Dellis POUR, RN Outcome: Progressing 11/03/2023 2046 by Florian Dellis POUR, RN Outcome: Progressing Goal: Diagnostic test results will improve 11/03/2023 2046 by Florian Dellis POUR, RN Outcome: Progressing 11/03/2023 2046 by Florian Dellis POUR, RN Outcome: Progressing Goal: Respiratory complications will improve 11/03/2023 2046 by Florian Dellis POUR, RN Outcome: Progressing 11/03/2023 2046 by Florian Dellis POUR, RN Outcome: Progressing Goal: Cardiovascular complication will be avoided 11/03/2023 2046 by Hugh Kamara K, RN Outcome: Progressing 11/03/2023 2046 by Shaneil Yazdi K, RN Outcome: Progressing   Problem: Activity: Goal: Risk  for activity intolerance will decrease 11/03/2023 2046 by Mukhtar Shams K, RN Outcome: Progressing 11/03/2023 2046 by Shatima Zalar K, RN Outcome: Progressing   Problem: Nutrition: Goal: Adequate nutrition will be maintained 11/03/2023 2046 by Boneta Standre K, RN Outcome: Progressing 11/03/2023 2046 by Breah Joa K, RN Outcome: Progressing  Problem: Coping: Goal: Level of anxiety will decrease 11/03/2023 2046 by Florian Dellis POUR, RN Outcome: Progressing 11/03/2023 2046 by Florian Dellis POUR, RN Outcome: Progressing   Problem: Elimination: Goal: Will not experience complications related to bowel motility 11/03/2023 2046 by Florian Dellis POUR, RN Outcome: Progressing 11/03/2023 2046 by Florian Dellis POUR, RN Outcome: Progressing Goal: Will not experience complications related to urinary retention 11/03/2023 2046 by Florian Dellis POUR, RN Outcome: Progressing 11/03/2023 2046 by Florian Dellis POUR, RN Outcome: Progressing   Problem: Pain Managment: Goal: General experience of comfort will improve and/or be controlled 11/03/2023 2046 by Florian Dellis POUR, RN Outcome: Progressing 11/03/2023 2046 by Florian Dellis POUR, RN Outcome: Progressing   Problem: Safety: Goal: Ability to remain free from injury will improve 11/03/2023 2046 by Florian Dellis POUR, RN Outcome: Progressing 11/03/2023 2046 by Florian Dellis POUR, RN Outcome: Progressing   Problem: Skin Integrity: Goal: Risk for impaired skin integrity will decrease 11/03/2023 2046 by Florian Dellis POUR, RN Outcome: Progressing 11/03/2023 2046 by Florian Dellis POUR, RN Outcome: Progressing   Problem: Education: Goal: Knowledge of disease or condition will improve 11/03/2023 2046 by Florian Dellis POUR, RN Outcome: Progressing 11/03/2023 2046 by Florian Dellis POUR, RN Outcome: Progressing Goal: Knowledge of secondary prevention will improve (MUST DOCUMENT ALL) 11/03/2023 2046 by Florian Dellis POUR, RN Outcome: Progressing 11/03/2023  2046 by Florian Dellis POUR, RN Outcome: Progressing Goal: Knowledge of patient specific risk factors will improve (DELETE if not current risk factor) 11/03/2023 2046 by Florian Dellis POUR, RN Outcome: Progressing 11/03/2023 2046 by Florian Dellis POUR, RN Outcome: Progressing   Problem: Ischemic Stroke/TIA Tissue Perfusion: Goal: Complications of ischemic stroke/TIA will be minimized 11/03/2023 2046 by Ngoc Detjen K, RN Outcome: Progressing 11/03/2023 2046 by Florian Dellis POUR, RN Outcome: Progressing   Problem: Coping: Goal: Will verbalize positive feelings about self 11/03/2023 2046 by Florian Dellis POUR, RN Outcome: Progressing 11/03/2023 2046 by Florian Dellis POUR, RN Outcome: Progressing Goal: Will identify appropriate support needs 11/03/2023 2046 by Florian Dellis POUR, RN Outcome: Progressing 11/03/2023 2046 by Florian Dellis POUR, RN Outcome: Progressing   Problem: Health Behavior/Discharge Planning: Goal: Ability to manage health-related needs will improve 11/03/2023 2046 by Florian Dellis POUR, RN Outcome: Progressing 11/03/2023 2046 by Florian Dellis POUR, RN Outcome: Progressing Goal: Goals will be collaboratively established with patient/family 11/03/2023 2046 by Florian Dellis POUR, RN Outcome: Progressing 11/03/2023 2046 by Florian Dellis POUR, RN Outcome: Progressing   Problem: Self-Care: Goal: Ability to participate in self-care as condition permits will improve 11/03/2023 2046 by Florian Dellis POUR, RN Outcome: Progressing 11/03/2023 2046 by Carold Eisner K, RN Outcome: Progressing Goal: Verbalization of feelings and concerns over difficulty with self-care will improve 11/03/2023 2046 by Florian Dellis POUR, RN Outcome: Progressing 11/03/2023 2046 by Florian Dellis POUR, RN Outcome: Progressing Goal: Ability to communicate needs accurately will improve 11/03/2023 2046 by Florian Dellis POUR, RN Outcome: Progressing 11/03/2023 2046 by Florian Dellis POUR, RN Outcome: Progressing    Problem: Nutrition: Goal: Risk of aspiration will decrease 11/03/2023 2046 by Florian Dellis POUR, RN Outcome: Progressing 11/03/2023 2046 by Florian Dellis POUR, RN Outcome: Progressing Goal: Dietary intake will improve 11/03/2023 2046 by Florian Dellis POUR, RN Outcome: Progressing 11/03/2023 2046 by Creed Kail K, RN Outcome: Progressing   Problem: Education: Goal: Knowledge of the prescribed therapeutic regimen will improve 11/03/2023 2046 by Jackilyn Umphlett K, RN Outcome: Progressing 11/03/2023 2046 by Sultan Pargas K, RN Outcome: Progressing   Problem: Coping: Goal: Ability to identify and develop effective coping behavior  will improve 11/03/2023 2046 by Florian Dellis POUR, RN Outcome: Progressing 11/03/2023 2046 by Martavious Hartel K, RN Outcome: Progressing   Problem: Clinical Measurements: Goal: Quality of life will improve 11/03/2023 2046 by Glendola Friedhoff K, RN Outcome: Progressing 11/03/2023 2046 by Florian Dellis POUR, RN Outcome: Progressing   Problem: Respiratory: Goal: Verbalizations of increased ease of respirations will increase 11/03/2023 2046 by Florian Dellis POUR, RN Outcome: Progressing 11/03/2023 2046 by Macarthur Lorusso K, RN Outcome: Progressing   Problem: Role Relationship: Goal: Family's ability to cope with current situation will improve 11/03/2023 2046 by Nalina Yeatman K, RN Outcome: Progressing 11/03/2023 2046 by Laurissa Cowper K, RN Outcome: Progressing Goal: Ability to verbalize concerns, feelings, and thoughts to partner or family member will improve 11/03/2023 2046 by Bedie Dominey K, RN Outcome: Progressing 11/03/2023 2046 by Caroll Cunnington K, RN Outcome: Progressing   Problem: Pain Management: Goal: Satisfaction with pain management regimen will improve 11/03/2023 2046 by Nellene Courtois K, RN Outcome: Progressing 11/03/2023 2046 by Tyrique Sporn K, RN Outcome: Progressing
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
## 2023-11-03 NOTE — Plan of Care (Signed)
 Problem: Education: Goal: Ability to describe self-care measures that may prevent or decrease complications (Diabetes Survival Skills Education) will improve Outcome: Progressing Goal: Individualized Educational Video(s) Outcome: Progressing   Problem: Coping: Goal: Ability to adjust to condition or change in health will improve Outcome: Progressing   Problem: Fluid Volume: Goal: Ability to maintain a balanced intake and output will improve Outcome: Progressing   Problem: Health Behavior/Discharge Planning: Goal: Ability to identify and utilize available resources and services will improve Outcome: Progressing Goal: Ability to manage health-related needs will improve Outcome: Progressing   Problem: Metabolic: Goal: Ability to maintain appropriate glucose levels will improve Outcome: Progressing   Problem: Nutritional: Goal: Maintenance of adequate nutrition will improve Outcome: Progressing Goal: Progress toward achieving an optimal weight will improve Outcome: Progressing   Problem: Skin Integrity: Goal: Risk for impaired skin integrity will decrease Outcome: Progressing   Problem: Tissue Perfusion: Goal: Adequacy of tissue perfusion will improve Outcome: Progressing   Problem: Education: Goal: Knowledge of General Education information will improve Description: Including pain rating scale, medication(s)/side effects and non-pharmacologic comfort measures Outcome: Progressing   Problem: Health Behavior/Discharge Planning: Goal: Ability to manage health-related needs will improve Outcome: Progressing   Problem: Clinical Measurements: Goal: Ability to maintain clinical measurements within normal limits will improve Outcome: Progressing Goal: Will remain free from infection Outcome: Progressing Goal: Diagnostic test results will improve Outcome: Progressing Goal: Respiratory complications will improve Outcome: Progressing Goal: Cardiovascular complication will  be avoided Outcome: Progressing   Problem: Activity: Goal: Risk for activity intolerance will decrease Outcome: Progressing   Problem: Nutrition: Goal: Adequate nutrition will be maintained Outcome: Progressing   Problem: Coping: Goal: Level of anxiety will decrease Outcome: Progressing   Problem: Elimination: Goal: Will not experience complications related to bowel motility Outcome: Progressing Goal: Will not experience complications related to urinary retention Outcome: Progressing   Problem: Pain Managment: Goal: General experience of comfort will improve and/or be controlled Outcome: Progressing   Problem: Safety: Goal: Ability to remain free from injury will improve Outcome: Progressing   Problem: Skin Integrity: Goal: Risk for impaired skin integrity will decrease Outcome: Progressing   Problem: Education: Goal: Knowledge of disease or condition will improve Outcome: Progressing Goal: Knowledge of secondary prevention will improve (MUST DOCUMENT ALL) Outcome: Progressing Goal: Knowledge of patient specific risk factors will improve (DELETE if not current risk factor) Outcome: Progressing   Problem: Ischemic Stroke/TIA Tissue Perfusion: Goal: Complications of ischemic stroke/TIA will be minimized Outcome: Progressing   Problem: Coping: Goal: Will verbalize positive feelings about self Outcome: Progressing Goal: Will identify appropriate support needs Outcome: Progressing   Problem: Health Behavior/Discharge Planning: Goal: Ability to manage health-related needs will improve Outcome: Progressing Goal: Goals will be collaboratively established with patient/family Outcome: Progressing   Problem: Self-Care: Goal: Ability to participate in self-care as condition permits will improve Outcome: Progressing Goal: Verbalization of feelings and concerns over difficulty with self-care will improve Outcome: Progressing Goal: Ability to communicate needs  accurately will improve Outcome: Progressing   Problem: Nutrition: Goal: Risk of aspiration will decrease Outcome: Progressing Goal: Dietary intake will improve Outcome: Progressing   Problem: Education: Goal: Knowledge of the prescribed therapeutic regimen will improve Outcome: Progressing   Problem: Coping: Goal: Ability to identify and develop effective coping behavior will improve Outcome: Progressing   Problem: Clinical Measurements: Goal: Quality of life will improve Outcome: Progressing   Problem: Respiratory: Goal: Verbalizations of increased ease of respirations will increase Outcome: Progressing   Problem: Role Relationship: Goal: Family's ability to  cope with current situation will improve Outcome: Progressing Goal: Ability to verbalize concerns, feelings, and thoughts to partner or family member will improve Outcome: Progressing   Problem: Pain Management: Goal: Satisfaction with pain management regimen will improve Outcome: Progressing

## 2023-11-04 DIAGNOSIS — A419 Sepsis, unspecified organism: Secondary | ICD-10-CM | POA: Diagnosis not present

## 2023-11-04 LAB — GLUCOSE, CAPILLARY
Glucose-Capillary: 186 mg/dL — ABNORMAL HIGH (ref 70–99)
Glucose-Capillary: 210 mg/dL — ABNORMAL HIGH (ref 70–99)
Glucose-Capillary: 216 mg/dL — ABNORMAL HIGH (ref 70–99)
Glucose-Capillary: 230 mg/dL — ABNORMAL HIGH (ref 70–99)
Glucose-Capillary: 254 mg/dL — ABNORMAL HIGH (ref 70–99)
Glucose-Capillary: 278 mg/dL — ABNORMAL HIGH (ref 70–99)

## 2023-11-04 MED ORDER — INSULIN GLARGINE-YFGN 100 UNIT/ML ~~LOC~~ SOLN
10.0000 [IU] | Freq: Every day | SUBCUTANEOUS | Status: DC
  Administered 2023-11-04 – 2023-11-10 (×7): 10 [IU] via SUBCUTANEOUS
  Filled 2023-11-04 (×7): qty 0.1

## 2023-11-04 MED ORDER — CLOPIDOGREL BISULFATE 75 MG PO TABS
75.0000 mg | ORAL_TABLET | Freq: Every day | ORAL | Status: DC
  Filled 2023-11-04: qty 1

## 2023-11-04 MED ORDER — CEFAZOLIN SODIUM-DEXTROSE 2-4 GM/100ML-% IV SOLN: 2.0000 g | INTRAVENOUS | Status: DC

## 2023-11-04 MED ORDER — ALBUMIN HUMAN 25 % IV SOLN
12.5000 g | Freq: Once | INTRAVENOUS | Status: AC
  Administered 2023-11-04: 12.5 g via INTRAVENOUS
  Filled 2023-11-04: qty 50
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
## 2023-11-04 NOTE — Progress Notes (Signed)
 Reached out pharmacy because the tube feeding is being hung with the 2 bag system and we aren't allowed to use that anymore per new protocol so we are supposed to reach out to pharmacy to get the order changed.

## 2023-11-04 NOTE — Progress Notes (Signed)
 Per [place], NP no longer going to attempt g tube so does not need contrast

## 2023-11-04 NOTE — Inpatient Diabetes Management (Signed)
 Inpatient Diabetes Program Recommendations  AACE/ADA: New Consensus Statement on Inpatient Glycemic Control   Target Ranges:  Prepandial:   less than 140 mg/dL      Peak postprandial:   less than 180 mg/dL (1-2 hours)      Critically ill patients:  140 - 180 mg/dL    Latest Reference Range & Units 11/03/23 07:27 11/03/23 12:14 11/03/23 16:53 11/03/23 20:11 11/03/23 23:28 11/04/23 03:40 11/04/23 08:07  Glucose-Capillary 70 - 99 mg/dL 702 (H) 730 (H) 733 (H) 333 (H) 310 (H) 210 (H) 254 (H)   Review of Glycemic Control  Diabetes history: DM2 Outpatient Diabetes medications: Metformin  XR 750 mg BID (not taking) Current orders for Inpatient glycemic control: Novolog  0-9 units Q4H; Jevity @ 60 ml/hr  Inpatient Diabetes Program Recommendations:    Insulin : Please consider ordering Semglee 7 units Q24H.  When tube feeding resumed after g-tube placement today,please consider ordering Novolog  6 units Q4H for tube feeding coverage. If tube feeding is stopped or held then Novolog  tube feeding coverage should also be stopped or held.  Thanks, Earnie Gainer, RN, MSN, CDCES Diabetes Coordinator Inpatient Diabetes Program [phone] (Team Pager from 8am to 5pm)

## 2023-11-04 NOTE — Progress Notes (Signed)
 night nurse to Give 1 cup of thin barium via NG tube at 2100 11/04/23 for 11/05/23 G tube placement attempt. Call [phone] and they can send the barium to you (pharmacy does not send). The bottle has directions on how to reconstitute. Will follow with a KUB in AM.
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
## 2023-11-04 NOTE — Progress Notes (Signed)
 Per [place],NP Hold at 9pm or after giving barium via NG tonight for g tube placement tomorrow.

## 2023-11-05 DIAGNOSIS — R638 Other symptoms and signs concerning food and fluid intake: Secondary | ICD-10-CM

## 2023-11-05 DIAGNOSIS — A419 Sepsis, unspecified organism: Secondary | ICD-10-CM | POA: Diagnosis not present

## 2023-11-05 DIAGNOSIS — Z515 Encounter for palliative care: Secondary | ICD-10-CM | POA: Diagnosis not present

## 2023-11-05 DIAGNOSIS — G8220 Paraplegia, unspecified: Secondary | ICD-10-CM | POA: Diagnosis not present

## 2023-11-05 DIAGNOSIS — Z7189 Other specified counseling: Secondary | ICD-10-CM | POA: Diagnosis not present

## 2023-11-05 DIAGNOSIS — M4628 Osteomyelitis of vertebra, sacral and sacrococcygeal region: Secondary | ICD-10-CM | POA: Diagnosis not present

## 2023-11-05 LAB — GLUCOSE, CAPILLARY
Glucose-Capillary: 192 mg/dL — ABNORMAL HIGH (ref 70–99)
Glucose-Capillary: 175 mg/dL — ABNORMAL HIGH (ref 70–99)
Glucose-Capillary: 194 mg/dL — ABNORMAL HIGH (ref 70–99)
Glucose-Capillary: 296 mg/dL — ABNORMAL HIGH (ref 70–99)
Glucose-Capillary: 217 mg/dL — ABNORMAL HIGH (ref 70–99)

## 2023-11-05 MED ORDER — GLUCERNA SHAKE PO LIQD
237.0000 mL | Freq: Two times a day (BID) | ORAL | Status: DC
Start: 2023-11-06 — End: 2023-11-05

## 2023-11-05 MED ORDER — CLOPIDOGREL BISULFATE 75 MG PO TABS
75.0000 mg | ORAL_TABLET | Freq: Every day | ORAL | Status: DC
  Administered 2023-11-05 – 2023-11-11 (×7): 75 mg
  Filled 2023-11-05 (×6): qty 1

## 2023-11-05 MED ORDER — ENSURE PLUS HIGH PROTEIN PO LIQD
237.0000 mL | Freq: Two times a day (BID) | ORAL | Status: DC
  Administered 2023-11-07 – 2023-11-13 (×11): 237 mL via ORAL
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
## 2023-11-05 NOTE — Progress Notes (Signed)
 Patient file has DNR paper signed, while on epic the patient code status is on full code. Clarified with Lynwood Kipper NP and the signed DNR is for outside hospital use only. Patient will be still on Full Code during hospital stay. Charge Nurse aware.

## 2023-11-05 NOTE — Plan of Care (Signed)
   Problem: Nutritional: Goal: Maintenance of adequate nutrition will improve Outcome: Progressing

## 2023-11-06 DIAGNOSIS — E43 Unspecified severe protein-calorie malnutrition: Secondary | ICD-10-CM | POA: Diagnosis not present

## 2023-11-06 DIAGNOSIS — Z515 Encounter for palliative care: Secondary | ICD-10-CM

## 2023-11-06 DIAGNOSIS — L98429 Non-pressure chronic ulcer of back with unspecified severity: Secondary | ICD-10-CM

## 2023-11-06 DIAGNOSIS — R638 Other symptoms and signs concerning food and fluid intake: Secondary | ICD-10-CM | POA: Diagnosis not present

## 2023-11-06 DIAGNOSIS — L89154 Pressure ulcer of sacral region, stage 4: Secondary | ICD-10-CM | POA: Diagnosis not present

## 2023-11-06 LAB — CBC
HCT: 24.5 % — ABNORMAL LOW (ref 39.0–52.0)
Hemoglobin: 7.6 g/dL — ABNORMAL LOW (ref 13.0–17.0)
MCH: 27.8 pg (ref 26.0–34.0)
MCHC: 31 g/dL (ref 30.0–36.0)
MCV: 89.7 fL (ref 80.0–100.0)
Platelets: 227 K/uL (ref 150–400)
RBC: 2.73 MIL/uL — ABNORMAL LOW (ref 4.22–5.81)
RDW: 17.3 % — ABNORMAL HIGH (ref 11.5–15.5)
WBC: 8.9 K/uL (ref 4.0–10.5)
nRBC: 0.3 % — ABNORMAL HIGH (ref 0.0–0.2)

## 2023-11-06 LAB — GLUCOSE, CAPILLARY
Glucose-Capillary: 141 mg/dL — ABNORMAL HIGH (ref 70–99)
Glucose-Capillary: 165 mg/dL — ABNORMAL HIGH (ref 70–99)
Glucose-Capillary: 173 mg/dL — ABNORMAL HIGH (ref 70–99)
Glucose-Capillary: 179 mg/dL — ABNORMAL HIGH (ref 70–99)
Glucose-Capillary: 204 mg/dL — ABNORMAL HIGH (ref 70–99)
Glucose-Capillary: 208 mg/dL — ABNORMAL HIGH (ref 70–99)
Glucose-Capillary: 271 mg/dL — ABNORMAL HIGH (ref 70–99)

## 2023-11-06 LAB — RENAL FUNCTION PANEL
Albumin: 1.6 g/dL — ABNORMAL LOW (ref 3.5–5.0)
Anion gap: 7 (ref 5–15)
BUN: 69 mg/dL — ABNORMAL HIGH (ref 8–23)
CO2: 23 mmol/L (ref 22–32)
Calcium: 8.2 mg/dL — ABNORMAL LOW (ref 8.9–10.3)
Chloride: 114 mmol/L — ABNORMAL HIGH (ref 98–111)
Creatinine, Ser: 0.61 mg/dL (ref 0.61–1.24)
GFR, Estimated: >60 mL/min (ref >60)
Glucose, Bld: 151 mg/dL — ABNORMAL HIGH (ref 70–99)
Phosphorus: 1.5 mg/dL — ABNORMAL LOW (ref 2.5–4.6)
Potassium: 4.9 mmol/L (ref 3.5–5.1)
Sodium: 144 mmol/L (ref 135–145)

## 2023-11-06 LAB — MAGNESIUM: Magnesium: 1.6 mg/dL — ABNORMAL LOW (ref 1.7–2.4)

## 2023-11-06 LAB — CK: Total CK: 41 U/L — ABNORMAL LOW (ref 49–397)

## 2023-11-06 MED ORDER — HALOPERIDOL LACTATE 5 MG/ML IJ SOLN
1.0000 mg | Freq: Once | INTRAMUSCULAR | Status: AC
  Administered 2023-11-06: 1 mg via INTRAVENOUS
  Filled 2023-11-06: qty 1

## 2023-11-06 NOTE — Plan of Care (Addendum)
 Patient was irritable at times, with pain but tolerable. Refused to eat lunch and able to eat his ice cream for dinner. Wound dressing change. See wound dressing photos. Repositioned to comfort. Call bell within reached.   Problem: Education: Goal: Ability to describe self-care measures that may prevent or decrease complications (Diabetes Survival Skills Education) will improve Outcome: Progressing Goal: Individualized Educational Video(s) Outcome: Progressing   Problem: Coping: Goal: Ability to adjust to condition or change in health will improve Outcome: Progressing   Problem: Fluid Volume: Goal: Ability to maintain a balanced intake and output will improve Outcome: Progressing   Problem: Health Behavior/Discharge Planning: Goal: Ability to identify and utilize available resources and services will improve Outcome: Progressing Goal: Ability to manage health-related needs will improve Outcome: Progressing   Problem: Metabolic: Goal: Ability to maintain appropriate glucose levels will improve Outcome: Progressing   Problem: Nutritional: Goal: Maintenance of adequate nutrition will improve Outcome: Progressing Goal: Progress toward achieving an optimal weight will improve Outcome: Progressing   Problem: Skin Integrity: Goal: Risk for impaired skin integrity will decrease Outcome: Progressing   Problem: Tissue Perfusion: Goal: Adequacy of tissue perfusion will improve Outcome: Progressing   Problem: Education: Goal: Knowledge of General Education information will improve Description: Including pain rating scale, medication(s)/side effects and non-pharmacologic comfort measures Outcome: Progressing   Problem: Health Behavior/Discharge Planning: Goal: Ability to manage health-related needs will improve Outcome: Progressing   Problem: Clinical Measurements: Goal: Ability to maintain clinical measurements within normal limits will improve Outcome: Progressing Goal: Will  remain free from infection Outcome: Progressing Goal: Diagnostic test results will improve Outcome: Progressing Goal: Respiratory complications will improve Outcome: Progressing Goal: Cardiovascular complication will be avoided Outcome: Progressing   Problem: Activity: Goal: Risk for activity intolerance will decrease Outcome: Progressing   Problem: Nutrition: Goal: Adequate nutrition will be maintained Outcome: Progressing   Problem: Coping: Goal: Level of anxiety will decrease Outcome: Progressing   Problem: Elimination: Goal: Will not experience complications related to bowel motility Outcome: Progressing Goal: Will not experience complications related to urinary retention Outcome: Progressing   Problem: Pain Managment: Goal: General experience of comfort will improve and/or be controlled Outcome: Progressing   Problem: Safety: Goal: Ability to remain free from injury will improve Outcome: Progressing   Problem: Skin Integrity: Goal: Risk for impaired skin integrity will decrease Outcome: Progressing   Problem: Education: Goal: Knowledge of disease or condition will improve Outcome: Progressing Goal: Knowledge of secondary prevention will improve (MUST DOCUMENT ALL) Outcome: Progressing Goal: Knowledge of patient specific risk factors will improve (DELETE if not current risk factor) Outcome: Progressing   Problem: Ischemic Stroke/TIA Tissue Perfusion: Goal: Complications of ischemic stroke/TIA will be minimized Outcome: Progressing   Problem: Coping: Goal: Will verbalize positive feelings about self Outcome: Progressing Goal: Will identify appropriate support needs Outcome: Progressing   Problem: Health Behavior/Discharge Planning: Goal: Ability to manage health-related needs will improve Outcome: Progressing Goal: Goals will be collaboratively established with patient/family Outcome: Progressing   Problem: Self-Care: Goal: Ability to participate in  self-care as condition permits will improve Outcome: Progressing Goal: Verbalization of feelings and concerns over difficulty with self-care will improve Outcome: Progressing Goal: Ability to communicate needs accurately will improve Outcome: Progressing   Problem: Nutrition: Goal: Risk of aspiration will decrease Outcome: Progressing Goal: Dietary intake will improve Outcome: Progressing   Problem: Education: Goal: Knowledge of the prescribed therapeutic regimen will improve Outcome: Progressing   Problem: Coping: Goal: Ability to identify and develop effective coping behavior will improve Outcome:  Progressing   Problem: Clinical Measurements: Goal: Quality of life will improve Outcome: Progressing   Problem: Respiratory: Goal: Verbalizations of increased ease of respirations will increase Outcome: Progressing   Problem: Role Relationship: Goal: Family's ability to cope with current situation will improve Outcome: Progressing Goal: Ability to verbalize concerns, feelings, and thoughts to partner or family member will improve Outcome: Progressing   Problem: Pain Management: Goal: Satisfaction with pain management regimen will improve Outcome: Progressing

## 2023-11-06 NOTE — Plan of Care (Signed)
   Problem: Nutritional: Goal: Maintenance of adequate nutrition will improve Outcome: Progressing

## 2023-11-06 NOTE — Progress Notes (Signed)
 Wound dressing change done as instructed for sacral area, buttocks, left leg, right leg and right arm.

## 2023-11-06 NOTE — Progress Notes (Signed)
 Dressing done on wound on Left hand and 5th digit finger.
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
## 2023-11-06 NOTE — Progress Notes (Signed)
 Pt refused 4am zenia Spears, RN notified.
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
## 2023-11-06 NOTE — Progress Notes (Signed)
 Wound dressing changed on the left hand and the 5th digit finger on the left hand.
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
## 2023-11-07 DIAGNOSIS — A419 Sepsis, unspecified organism: Secondary | ICD-10-CM | POA: Diagnosis not present

## 2023-11-07 LAB — GLUCOSE, CAPILLARY
Glucose-Capillary: 162 mg/dL — ABNORMAL HIGH (ref 70–99)
Glucose-Capillary: 148 mg/dL — ABNORMAL HIGH (ref 70–99)
Glucose-Capillary: 171 mg/dL — ABNORMAL HIGH (ref 70–99)
Glucose-Capillary: 137 mg/dL — ABNORMAL HIGH (ref 70–99)
Glucose-Capillary: 262 mg/dL — ABNORMAL HIGH (ref 70–99)
Glucose-Capillary: 271 mg/dL — ABNORMAL HIGH (ref 70–99)
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
## 2023-11-07 NOTE — Plan of Care (Addendum)
 Wound dressing changed. Arousable. Patient still has poor appetite, but was able to drink his ensure and eat 3 spoon of magic cup. Positioned to comfort.  Problem: Education: Goal: Ability to describe self-care measures that may prevent or decrease complications (Diabetes Survival Skills Education) will improve Outcome: Progressing Goal: Individualized Educational Video(s) Outcome: Progressing   Problem: Coping: Goal: Ability to adjust to condition or change in health will improve Outcome: Progressing   Problem: Fluid Volume: Goal: Ability to maintain a balanced intake and output will improve Outcome: Progressing   Problem: Health Behavior/Discharge Planning: Goal: Ability to identify and utilize available resources and services will improve Outcome: Progressing Goal: Ability to manage health-related needs will improve Outcome: Progressing   Problem: Metabolic: Goal: Ability to maintain appropriate glucose levels will improve Outcome: Progressing   Problem: Nutritional: Goal: Maintenance of adequate nutrition will improve Outcome: Progressing Goal: Progress toward achieving an optimal weight will improve Outcome: Progressing   Problem: Skin Integrity: Goal: Risk for impaired skin integrity will decrease Outcome: Progressing   Problem: Tissue Perfusion: Goal: Adequacy of tissue perfusion will improve Outcome: Progressing   Problem: Education: Goal: Knowledge of General Education information will improve Description: Including pain rating scale, medication(s)/side effects and non-pharmacologic comfort measures Outcome: Progressing   Problem: Health Behavior/Discharge Planning: Goal: Ability to manage health-related needs will improve Outcome: Progressing   Problem: Clinical Measurements: Goal: Ability to maintain clinical measurements within normal limits will improve Outcome: Progressing Goal: Will remain free from infection Outcome: Progressing Goal: Diagnostic  test results will improve Outcome: Progressing Goal: Respiratory complications will improve Outcome: Progressing Goal: Cardiovascular complication will be avoided Outcome: Progressing   Problem: Activity: Goal: Risk for activity intolerance will decrease Outcome: Progressing   Problem: Nutrition: Goal: Adequate nutrition will be maintained Outcome: Progressing   Problem: Coping: Goal: Level of anxiety will decrease Outcome: Progressing   Problem: Elimination: Goal: Will not experience complications related to bowel motility Outcome: Progressing Goal: Will not experience complications related to urinary retention Outcome: Progressing   Problem: Pain Managment: Goal: General experience of comfort will improve and/or be controlled Outcome: Progressing   Problem: Safety: Goal: Ability to remain free from injury will improve Outcome: Progressing   Problem: Skin Integrity: Goal: Risk for impaired skin integrity will decrease Outcome: Progressing   Problem: Education: Goal: Knowledge of disease or condition will improve Outcome: Progressing Goal: Knowledge of secondary prevention will improve (MUST DOCUMENT ALL) Outcome: Progressing Goal: Knowledge of patient specific risk factors will improve (DELETE if not current risk factor) Outcome: Progressing   Problem: Ischemic Stroke/TIA Tissue Perfusion: Goal: Complications of ischemic stroke/TIA will be minimized Outcome: Progressing   Problem: Coping: Goal: Will verbalize positive feelings about self Outcome: Progressing Goal: Will identify appropriate support needs Outcome: Progressing   Problem: Health Behavior/Discharge Planning: Goal: Ability to manage health-related needs will improve Outcome: Progressing Goal: Goals will be collaboratively established with patient/family Outcome: Progressing   Problem: Self-Care: Goal: Ability to participate in self-care as condition permits will improve Outcome:  Progressing Goal: Verbalization of feelings and concerns over difficulty with self-care will improve Outcome: Progressing Goal: Ability to communicate needs accurately will improve Outcome: Progressing   Problem: Nutrition: Goal: Risk of aspiration will decrease Outcome: Progressing Goal: Dietary intake will improve Outcome: Progressing   Problem: Education: Goal: Knowledge of the prescribed therapeutic regimen will improve Outcome: Progressing   Problem: Coping: Goal: Ability to identify and develop effective coping behavior will improve Outcome: Progressing   Problem: Clinical Measurements: Goal: Quality of life will  improve Outcome: Progressing   Problem: Respiratory: Goal: Verbalizations of increased ease of respirations will increase Outcome: Progressing   Problem: Role Relationship: Goal: Family's ability to cope with current situation will improve Outcome: Progressing Goal: Ability to verbalize concerns, feelings, and thoughts to partner or family member will improve Outcome: Progressing   Problem: Pain Management: Goal: Satisfaction with pain management regimen will improve Outcome: Progressing

## 2023-11-08 DIAGNOSIS — G8220 Paraplegia, unspecified: Secondary | ICD-10-CM | POA: Diagnosis not present

## 2023-11-08 LAB — GLUCOSE, CAPILLARY
Glucose-Capillary: 191 mg/dL — ABNORMAL HIGH (ref 70–99)
Glucose-Capillary: 234 mg/dL — ABNORMAL HIGH (ref 70–99)
Glucose-Capillary: 223 mg/dL — ABNORMAL HIGH (ref 70–99)
Glucose-Capillary: 204 mg/dL — ABNORMAL HIGH (ref 70–99)
Glucose-Capillary: 231 mg/dL — ABNORMAL HIGH (ref 70–99)
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
## 2023-11-08 NOTE — Plan of Care (Signed)
 Patient has been conversant throughout the day. Wound dressing change. Still has poor appetite but able to drink ensure. Problem: Education: Goal: Ability to describe self-care measures that may prevent or decrease complications (Diabetes Survival Skills Education) will improve Outcome: Progressing Goal: Individualized Educational Video(s) Outcome: Progressing   Problem: Coping: Goal: Ability to adjust to condition or change in health will improve Outcome: Progressing   Problem: Fluid Volume: Goal: Ability to maintain a balanced intake and output will improve Outcome: Progressing   Problem: Health Behavior/Discharge Planning: Goal: Ability to identify and utilize available resources and services will improve Outcome: Progressing Goal: Ability to manage health-related needs will improve Outcome: Progressing   Problem: Metabolic: Goal: Ability to maintain appropriate glucose levels will improve Outcome: Progressing   Problem: Nutritional: Goal: Maintenance of adequate nutrition will improve Outcome: Progressing Goal: Progress toward achieving an optimal weight will improve Outcome: Progressing   Problem: Skin Integrity: Goal: Risk for impaired skin integrity will decrease Outcome: Progressing   Problem: Tissue Perfusion: Goal: Adequacy of tissue perfusion will improve Outcome: Progressing   Problem: Education: Goal: Knowledge of General Education information will improve Description: Including pain rating scale, medication(s)/side effects and non-pharmacologic comfort measures Outcome: Progressing   Problem: Health Behavior/Discharge Planning: Goal: Ability to manage health-related needs will improve Outcome: Progressing   Problem: Clinical Measurements: Goal: Ability to maintain clinical measurements within normal limits will improve Outcome: Progressing Goal: Will remain free from infection Outcome: Progressing Goal: Diagnostic test results will improve Outcome:  Progressing Goal: Respiratory complications will improve Outcome: Progressing Goal: Cardiovascular complication will be avoided Outcome: Progressing   Problem: Activity: Goal: Risk for activity intolerance will decrease Outcome: Progressing   Problem: Nutrition: Goal: Adequate nutrition will be maintained Outcome: Progressing   Problem: Coping: Goal: Level of anxiety will decrease Outcome: Progressing   Problem: Elimination: Goal: Will not experience complications related to bowel motility Outcome: Progressing Goal: Will not experience complications related to urinary retention Outcome: Progressing   Problem: Pain Managment: Goal: General experience of comfort will improve and/or be controlled Outcome: Progressing   Problem: Safety: Goal: Ability to remain free from injury will improve Outcome: Progressing   Problem: Skin Integrity: Goal: Risk for impaired skin integrity will decrease Outcome: Progressing   Problem: Education: Goal: Knowledge of disease or condition will improve Outcome: Progressing Goal: Knowledge of secondary prevention will improve (MUST DOCUMENT ALL) Outcome: Progressing Goal: Knowledge of patient specific risk factors will improve (DELETE if not current risk factor) Outcome: Progressing   Problem: Ischemic Stroke/TIA Tissue Perfusion: Goal: Complications of ischemic stroke/TIA will be minimized Outcome: Progressing   Problem: Coping: Goal: Will verbalize positive feelings about self Outcome: Progressing Goal: Will identify appropriate support needs Outcome: Progressing   Problem: Health Behavior/Discharge Planning: Goal: Ability to manage health-related needs will improve Outcome: Progressing Goal: Goals will be collaboratively established with patient/family Outcome: Progressing   Problem: Self-Care: Goal: Ability to participate in self-care as condition permits will improve Outcome: Progressing Goal: Verbalization of feelings and  concerns over difficulty with self-care will improve Outcome: Progressing Goal: Ability to communicate needs accurately will improve Outcome: Progressing   Problem: Nutrition: Goal: Risk of aspiration will decrease Outcome: Progressing Goal: Dietary intake will improve Outcome: Progressing   Problem: Education: Goal: Knowledge of the prescribed therapeutic regimen will improve Outcome: Progressing   Problem: Coping: Goal: Ability to identify and develop effective coping behavior will improve Outcome: Progressing   Problem: Clinical Measurements: Goal: Quality of life will improve Outcome: Progressing   Problem: Respiratory: Goal:  Verbalizations of increased ease of respirations will increase Outcome: Progressing   Problem: Role Relationship: Goal: Family's ability to cope with current situation will improve Outcome: Progressing Goal: Ability to verbalize concerns, feelings, and thoughts to partner or family member will improve Outcome: Progressing   Problem: Pain Management: Goal: Satisfaction with pain management regimen will improve Outcome: Progressing

## 2023-11-08 NOTE — Care Plan (Signed)
 Patient has appetite for breakfast but patient refused the food on his tray upon seeing Puree. Patient was able to drink half cup of his cranberry, 1 whole milk and half of his ensure.

## 2023-11-09 DIAGNOSIS — E43 Unspecified severe protein-calorie malnutrition: Secondary | ICD-10-CM | POA: Diagnosis not present

## 2023-11-09 LAB — GLUCOSE, CAPILLARY
Glucose-Capillary: 206 mg/dL — ABNORMAL HIGH (ref 70–99)
Glucose-Capillary: 228 mg/dL — ABNORMAL HIGH (ref 70–99)
Glucose-Capillary: 240 mg/dL — ABNORMAL HIGH (ref 70–99)
Glucose-Capillary: 312 mg/dL — ABNORMAL HIGH (ref 70–99)
Glucose-Capillary: 325 mg/dL — ABNORMAL HIGH (ref 70–99)
Glucose-Capillary: 334 mg/dL — ABNORMAL HIGH (ref 70–99)

## 2023-11-09 MED ORDER — OXIDIZED CELLULOSE EX PADS
3.0000 | MEDICATED_PAD | Freq: Once | CUTANEOUS | Status: AC
  Administered 2023-11-09: 3 via TOPICAL
  Filled 2023-11-09: qty 3

## 2023-11-09 NOTE — Progress Notes (Signed)
 Bloody discharge was noticed around pt's central line this morning following clean up due to bowel incontinence.  Pt was turned onto his back after lying on his right side (during clean up) and a small amount of bloody discharge was noted on his gown and arm.  VSS on RA. Dressing appears to be in place and has not shifted.  The access site did not get caught on anything nor was inadvertently pulled.  IV consult was placed.  IV team member, Ammon, requested that three surgicels were ordered.  Day nurse is aware that she is to contact the IV team when the surgicel arrives from pharmacy.

## 2023-11-09 NOTE — Plan of Care (Signed)
  Problem: Fluid Volume: Goal: Ability to maintain a balanced intake and output will improve Outcome: Progressing   Problem: Nutritional: Goal: Maintenance of adequate nutrition will improve Outcome: Progressing   Problem: Skin Integrity: Goal: Risk for impaired skin integrity will decrease Outcome: Progressing   Problem: Pain Managment: Goal: General experience of comfort will improve and/or be controlled Outcome: Progressing   Problem: Safety: Goal: Ability to remain free from injury will improve Outcome: Progressing

## 2023-11-09 NOTE — Plan of Care (Signed)
  Problem: Coping: Goal: Ability to adjust to condition or change in health will improve Outcome: Progressing   Problem: Fluid Volume: Goal: Ability to maintain a balanced intake and output will improve Outcome: Progressing   Problem: Health Behavior/Discharge Planning: Goal: Ability to manage health-related needs will improve Outcome: Progressing   Problem: Metabolic: Goal: Ability to maintain appropriate glucose levels will improve Outcome: Progressing   Problem: Nutritional: Goal: Maintenance of adequate nutrition will improve Outcome: Progressing   Problem: Skin Integrity: Goal: Risk for impaired skin integrity will decrease Outcome: Progressing   Problem: Tissue Perfusion: Goal: Adequacy of tissue perfusion will improve Outcome: Progressing   Problem: Clinical Measurements: Goal: Will remain free from infection Outcome: Progressing Goal: Diagnostic test results will improve Outcome: Progressing Goal: Respiratory complications will improve Outcome: Progressing Goal: Cardiovascular complication will be avoided Outcome: Progressing   Problem: Activity: Goal: Risk for activity intolerance will decrease Outcome: Progressing   Problem: Nutrition: Goal: Adequate nutrition will be maintained Outcome: Progressing   Problem: Coping: Goal: Level of anxiety will decrease Outcome: Progressing   Problem: Elimination: Goal: Will not experience complications related to bowel motility Outcome: Progressing Goal: Will not experience complications related to urinary retention Outcome: Progressing   Problem: Pain Managment: Goal: General experience of comfort will improve and/or be controlled Outcome: Progressing   Problem: Ischemic Stroke/TIA Tissue Perfusion: Goal: Complications of ischemic stroke/TIA will be minimized Outcome: Progressing   Problem: Nutrition: Goal: Risk of aspiration will decrease Outcome: Progressing Goal: Dietary intake will improve Outcome:  Progressing

## 2023-11-09 NOTE — Progress Notes (Signed)
 Calorie Count Note  48 hour calorie count ordered.  Noted ethics consult placed for medical futility.  Nutrition Dx: Severe Malnutrition related to chronic illness as evidenced by severe muscle depletion, severe fat depletion, percent weight loss (has lost 34 lbs, 19% in 10 months); ongoing  Goal: Patient will meet greater than or equal to 90% of their needs; progressing   Intervention:   -48 hour calorie count per MD Continue tube feeding via Cortrak: Jevity 1.5 at 60 ml/hr (1440 ml per day) Prosource TF20 60 ml BID 200 ml FWF Q6H per MD   Provides 2320 kcal, 132 gm protein, 1094 ml free water daily (1894 ml water daily TF + FWF)   MVI with minerals daily per tube 500 mg Vitamin C daily per tube x 30 days and 220 mg Zinc  daily per tube x 15 days for wound healing  Follow along for GOC    Encourage PO intake, now on Dysphagia 2 diet with thin liquids  Nursing to assist with meals House trays  Magic cup TID with meals, each supplement provides 290 kcal and 9 grams of protein Ensure Plus High Protein po BID, each supplement provides 350 kcal and 20 grams of protein  Margery ORN, RD, LDN, CDCES Registered Dietitian III Certified Diabetes Care and Education Specialist If unable to reach this RD, please use RD Inpatient group chat on secure chat between hours of 8am-4 pm daily
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
## 2023-11-10 DIAGNOSIS — E43 Unspecified severe protein-calorie malnutrition: Secondary | ICD-10-CM | POA: Diagnosis not present

## 2023-11-10 LAB — GLUCOSE, CAPILLARY
Glucose-Capillary: 160 mg/dL — ABNORMAL HIGH (ref 70–99)
Glucose-Capillary: 167 mg/dL — ABNORMAL HIGH (ref 70–99)
Glucose-Capillary: 187 mg/dL — ABNORMAL HIGH (ref 70–99)
Glucose-Capillary: 205 mg/dL — ABNORMAL HIGH (ref 70–99)
Glucose-Capillary: 251 mg/dL — ABNORMAL HIGH (ref 70–99)
Glucose-Capillary: 254 mg/dL — ABNORMAL HIGH (ref 70–99)
Glucose-Capillary: 275 mg/dL — ABNORMAL HIGH (ref 70–99)

## 2023-11-10 LAB — CBC WITH DIFFERENTIAL/PLATELET
Abs Immature Granulocytes: 0.05 K/uL (ref 0.00–0.07)
Basophils Absolute: 0 K/uL (ref 0.0–0.1)
Basophils Relative: 0 %
Eosinophils Absolute: 0.1 K/uL (ref 0.0–0.5)
Eosinophils Relative: 1 %
HCT: 22.8 % — ABNORMAL LOW (ref 39.0–52.0)
Hemoglobin: 7.1 g/dL — ABNORMAL LOW (ref 13.0–17.0)
Immature Granulocytes: 1 %
Lymphocytes Relative: 18 %
Lymphs Abs: 1.5 K/uL (ref 0.7–4.0)
MCH: 28.2 pg (ref 26.0–34.0)
MCHC: 31.1 g/dL (ref 30.0–36.0)
MCV: 90.5 fL (ref 80.0–100.0)
Monocytes Absolute: 0.6 K/uL (ref 0.1–1.0)
Monocytes Relative: 8 %
Neutro Abs: 5.7 K/uL (ref 1.7–7.7)
Neutrophils Relative %: 72 %
Platelets: 261 K/uL (ref 150–400)
RBC: 2.52 MIL/uL — ABNORMAL LOW (ref 4.22–5.81)
RDW: 17.9 % — ABNORMAL HIGH (ref 11.5–15.5)
WBC: 7.9 K/uL (ref 4.0–10.5)
nRBC: 0.3 % — ABNORMAL HIGH (ref 0.0–0.2)

## 2023-11-10 LAB — RENAL FUNCTION PANEL
Albumin: 1.7 g/dL — ABNORMAL LOW (ref 3.5–5.0)
Anion gap: 10 (ref 5–15)
BUN: 85 mg/dL — ABNORMAL HIGH (ref 8–23)
CO2: 23 mmol/L (ref 22–32)
Calcium: 8.5 mg/dL — ABNORMAL LOW (ref 8.9–10.3)
Chloride: 105 mmol/L (ref 98–111)
Creatinine, Ser: 0.65 mg/dL (ref 0.61–1.24)
GFR, Estimated: >60 mL/min (ref >60)
Glucose, Bld: 322 mg/dL — ABNORMAL HIGH (ref 70–99)
Phosphorus: 3.2 mg/dL (ref 2.5–4.6)
Potassium: 4.7 mmol/L (ref 3.5–5.1)
Sodium: 138 mmol/L (ref 135–145)

## 2023-11-10 LAB — MAGNESIUM: Magnesium: 1.5 mg/dL — ABNORMAL LOW (ref 1.7–2.4)

## 2023-11-10 MED ORDER — INSULIN GLARGINE-YFGN 100 UNIT/ML ~~LOC~~ SOLN
20.0000 [IU] | Freq: Every day | SUBCUTANEOUS | Status: DC
  Administered 2023-11-10 – 2023-11-15 (×6): 20 [IU] via SUBCUTANEOUS
  Filled 2023-11-10 (×7): qty 0.2

## 2023-11-10 MED ORDER — MEGESTROL ACETATE 400 MG/10ML PO SUSP
400.0000 mg | Freq: Two times a day (BID) | ORAL | Status: DC
  Administered 2023-11-10 – 2023-11-16 (×13): 400 mg
  Filled 2023-11-10 (×14): qty 10

## 2023-11-10 MED ORDER — MAGNESIUM SULFATE 4 GM/100ML IV SOLN
4.0000 g | Freq: Once | INTRAVENOUS | Status: AC
  Administered 2023-11-10: 4 g via INTRAVENOUS
  Filled 2023-11-10: qty 100
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
## 2023-11-11 DIAGNOSIS — D649 Anemia, unspecified: Secondary | ICD-10-CM | POA: Diagnosis not present

## 2023-11-11 DIAGNOSIS — D62 Acute posthemorrhagic anemia: Secondary | ICD-10-CM

## 2023-11-11 DIAGNOSIS — A419 Sepsis, unspecified organism: Secondary | ICD-10-CM | POA: Diagnosis not present

## 2023-11-11 DIAGNOSIS — L89154 Pressure ulcer of sacral region, stage 4: Secondary | ICD-10-CM | POA: Diagnosis not present

## 2023-11-11 LAB — RENAL FUNCTION PANEL
Albumin: 1.6 g/dL — ABNORMAL LOW (ref 3.5–5.0)
Anion gap: 8 (ref 5–15)
BUN: 74 mg/dL — ABNORMAL HIGH (ref 8–23)
CO2: 25 mmol/L (ref 22–32)
Calcium: 8.1 mg/dL — ABNORMAL LOW (ref 8.9–10.3)
Chloride: 104 mmol/L (ref 98–111)
Creatinine, Ser: 0.53 mg/dL — ABNORMAL LOW (ref 0.61–1.24)
GFR, Estimated: >60 mL/min (ref >60)
Glucose, Bld: 215 mg/dL — ABNORMAL HIGH (ref 70–99)
Phosphorus: 3.2 mg/dL (ref 2.5–4.6)
Potassium: 5.6 mmol/L — ABNORMAL HIGH (ref 3.5–5.1)
Sodium: 137 mmol/L (ref 135–145)

## 2023-11-11 LAB — GLUCOSE, CAPILLARY
Glucose-Capillary: 186 mg/dL — ABNORMAL HIGH (ref 70–99)
Glucose-Capillary: 208 mg/dL — ABNORMAL HIGH (ref 70–99)
Glucose-Capillary: 244 mg/dL — ABNORMAL HIGH (ref 70–99)
Glucose-Capillary: 217 mg/dL — ABNORMAL HIGH (ref 70–99)
Glucose-Capillary: 243 mg/dL — ABNORMAL HIGH (ref 70–99)

## 2023-11-11 LAB — MAGNESIUM: Magnesium: 2.3 mg/dL (ref 1.7–2.4)

## 2023-11-11 MED ORDER — SODIUM ZIRCONIUM CYCLOSILICATE 10 G PO PACK
10.0000 g | PACK | Freq: Every day | ORAL | Status: AC
  Administered 2023-11-11 – 2023-11-13 (×3): 10 g via ORAL
  Filled 2023-11-11 (×3): qty 1

## 2023-11-11 MED ORDER — PANTOPRAZOLE SODIUM 40 MG IV SOLR
40.0000 mg | Freq: Two times a day (BID) | INTRAVENOUS | Status: DC
  Administered 2023-11-11 – 2023-12-03 (×44): 40 mg via INTRAVENOUS
  Filled 2023-11-11 (×45): qty 10

## 2023-11-11 MED ORDER — FLUDROCORTISONE ACETATE 0.1 MG PO TABS
0.0500 mg | ORAL_TABLET | Freq: Every day | ORAL | Status: DC
  Administered 2023-11-11: 0.05 mg via ORAL
  Filled 2023-11-11: qty 0.5

## 2023-11-11 NOTE — Plan of Care (Signed)
  Problem: Nutritional: Goal: Maintenance of adequate nutrition will improve Outcome: Progressing   Problem: Tissue Perfusion: Goal: Adequacy of tissue perfusion will improve Outcome: Progressing   Problem: Activity: Goal: Risk for activity intolerance will decrease Outcome: Progressing   Problem: Nutrition: Goal: Adequate nutrition will be maintained Outcome: Progressing
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
## 2023-11-12 DIAGNOSIS — F0390 Unspecified dementia without behavioral disturbance: Secondary | ICD-10-CM

## 2023-11-12 DIAGNOSIS — D649 Anemia, unspecified: Secondary | ICD-10-CM | POA: Diagnosis not present

## 2023-11-12 DIAGNOSIS — R195 Other fecal abnormalities: Secondary | ICD-10-CM

## 2023-11-12 DIAGNOSIS — A419 Sepsis, unspecified organism: Secondary | ICD-10-CM | POA: Diagnosis not present

## 2023-11-12 DIAGNOSIS — I639 Cerebral infarction, unspecified: Secondary | ICD-10-CM

## 2023-11-12 DIAGNOSIS — M4628 Osteomyelitis of vertebra, sacral and sacrococcygeal region: Secondary | ICD-10-CM | POA: Diagnosis not present

## 2023-11-12 DIAGNOSIS — R532 Functional quadriplegia: Secondary | ICD-10-CM | POA: Diagnosis not present

## 2023-11-12 LAB — CBC
HCT: 21.2 % — ABNORMAL LOW (ref 39.0–52.0)
HCT: 25.7 % — ABNORMAL LOW (ref 39.0–52.0)
Hemoglobin: 6.6 g/dL — CL (ref 13.0–17.0)
Hemoglobin: 8 g/dL — ABNORMAL LOW (ref 13.0–17.0)
MCH: 28.1 pg (ref 26.0–34.0)
MCH: 28.3 pg (ref 26.0–34.0)
MCHC: 31.1 g/dL (ref 30.0–36.0)
MCHC: 31.1 g/dL (ref 30.0–36.0)
MCV: 90.2 fL (ref 80.0–100.0)
MCV: 90.8 fL (ref 80.0–100.0)
Platelets: 304 K/uL (ref 150–400)
Platelets: 302 K/uL (ref 150–400)
RBC: 2.35 MIL/uL — ABNORMAL LOW (ref 4.22–5.81)
RBC: 2.83 MIL/uL — ABNORMAL LOW (ref 4.22–5.81)
RDW: 18.5 % — ABNORMAL HIGH (ref 11.5–15.5)
RDW: 17.8 % — ABNORMAL HIGH (ref 11.5–15.5)
WBC: 9.1 K/uL (ref 4.0–10.5)
WBC: 9.7 K/uL (ref 4.0–10.5)
nRBC: 1 % — ABNORMAL HIGH (ref 0.0–0.2)
nRBC: 0.6 % — ABNORMAL HIGH (ref 0.0–0.2)

## 2023-11-12 LAB — RENAL FUNCTION PANEL
Albumin: 1.8 g/dL — ABNORMAL LOW (ref 3.5–5.0)
Anion gap: 7 (ref 5–15)
BUN: 74 mg/dL — ABNORMAL HIGH (ref 8–23)
CO2: 22 mmol/L (ref 22–32)
Calcium: 7.9 mg/dL — ABNORMAL LOW (ref 8.9–10.3)
Chloride: 105 mmol/L (ref 98–111)
Creatinine, Ser: 0.64 mg/dL (ref 0.61–1.24)
GFR, Estimated: >60 mL/min (ref >60)
Glucose, Bld: 203 mg/dL — ABNORMAL HIGH (ref 70–99)
Phosphorus: 2.5 mg/dL (ref 2.5–4.6)
Potassium: 6.2 mmol/L — ABNORMAL HIGH (ref 3.5–5.1)
Sodium: 134 mmol/L — ABNORMAL LOW (ref 135–145)

## 2023-11-12 LAB — GLUCOSE, CAPILLARY
Glucose-Capillary: 186 mg/dL — ABNORMAL HIGH (ref 70–99)
Glucose-Capillary: 190 mg/dL — ABNORMAL HIGH (ref 70–99)
Glucose-Capillary: 195 mg/dL — ABNORMAL HIGH (ref 70–99)
Glucose-Capillary: 221 mg/dL — ABNORMAL HIGH (ref 70–99)
Glucose-Capillary: 232 mg/dL — ABNORMAL HIGH (ref 70–99)
Glucose-Capillary: 241 mg/dL — ABNORMAL HIGH (ref 70–99)

## 2023-11-12 LAB — HEMOGLOBIN AND HEMATOCRIT, BLOOD
HCT: 20.8 % — ABNORMAL LOW (ref 39.0–52.0)
Hemoglobin: 6.5 g/dL — CL (ref 13.0–17.0)

## 2023-11-12 LAB — PREPARE RBC (CROSSMATCH)

## 2023-11-12 MED ORDER — JEVITY 1.5 CAL/FIBER PO LIQD
1000.0000 mL | ORAL | Status: DC
  Administered 2023-11-12 – 2023-11-13 (×2): 1000 mL
  Filled 2023-11-12 (×2): qty 1000

## 2023-11-12 MED ORDER — SODIUM CHLORIDE 0.9% IV SOLUTION: Freq: Once | INTRAVENOUS | Status: AC

## 2023-11-12 MED ORDER — SODIUM ZIRCONIUM CYCLOSILICATE 10 G PO PACK
10.0000 g | PACK | Freq: Two times a day (BID) | ORAL | Status: AC
  Administered 2023-11-12 (×2): 10 g via ORAL
  Filled 2023-11-12 (×2): qty 1

## 2023-11-12 MED ORDER — SODIUM ZIRCONIUM CYCLOSILICATE 10 G PO PACK
10.0000 g | PACK | Freq: Once | ORAL | Status: AC
  Administered 2023-11-12: 10 g via ORAL
  Filled 2023-11-12: qty 1

## 2023-11-12 NOTE — Plan of Care (Signed)
  Problem: Skin Integrity: Goal: Risk for impaired skin integrity will decrease Outcome: Progressing   Problem: Tissue Perfusion: Goal: Adequacy of tissue perfusion will improve Outcome: Progressing   Problem: Skin Integrity: Goal: Risk for impaired skin integrity will decrease Outcome: Progressing

## 2023-11-12 NOTE — Progress Notes (Signed)
 Attempted to call the patient's daughter Reed Dady to obtain consent for the patient to receive blood products and was unable to get a response. Left a voicemail for a call back. Notified Hewitt, DO.

## 2023-11-12 NOTE — Plan of Care (Signed)
  Problem: Skin Integrity: Goal: Risk for impaired skin integrity will decrease Outcome: Progressing   Problem: Nutrition: Goal: Adequate nutrition will be maintained Outcome: Progressing   Problem: Nutritional: Goal: Maintenance of adequate nutrition will improve Outcome: Progressing

## 2023-11-12 NOTE — Progress Notes (Signed)
 Calorie Count Note  Pt more awake and alert today. Still slightly confused, reports he has no appetite, cannot explain why he does not want to eat. Per RN she tried to feed him breakfast 3 times this morning and pt refused. Only had 75% whole milk and sips of an Ensure this morning. Tolerating tube feeds at goal.  Needs assistance with meals.   Pt with minimal po intake, seems to have no interest in eating. Endorses no N/V, or abdominal pain. Mentation is a significant barrier to po intake. Likely nocturnal tube feeds would not stimulate appetite, pt also deconditioned with significant wounds that increase calorie and protein needs. Continue tube feeds until pt able to have consistent po intake. Unable to get updated weight from air bed. Discontinue calorie count.  48 hour calorie count ordered.  Diet: Dysphagia 1, thin liquids  Supplements: Magic cups, Ensure High Protein   Day 1, 10/26: Breakfast: 0% Lunch: 0% Dinner: 0% Supplements: 25% Magic cup,    Total intake: 75 kcal (4% of minimum estimated needs)  2.5 gm protein (2% of minimum estimated needs)   Day 2, 10/27: Breakfast: 0% Lunch: 0% Dinner: 0% Supplements: Bites of pudding, 1 Ensure High Protein  Total intake: 350 kcal (19% of minimum estimated needs)  20 gm protein (16% of minimum estimated needs)  Estimated Nutritional Needs:  Kcal:  1800-2100 kcal Protein:  120-140 gm Fluid:  >2L/day   INTERVENTION:  Continue tube feeding via Cortrak: Jevity 1.5 at 50 ml/hr (1200 ml per day) Prosource TF20 60 ml BID 200 ml FWF Q6H per MD   Provides 1960 kcal, 116 gm protein, 912 ml free water daily (1712 ml water daily TF + FWF)   MVI with minerals daily per tube 500 mg Vitamin C daily per tube x 30 days and 220 mg Zinc  daily per tube x 15 days for wound healing  Follow along for GOC    Encourage PO intake, now on Dysphagia 1 diet with thin liquids  Nursing to assist with meals House trays  Magic cup TID with meals,  each supplement provides 290 kcal and 9 grams of protein Ensure Plus High Protein po BID, each supplement provides 350 kcal and 20 grams of protein Discontinue calorie count    NUTRITION DIAGNOSIS:    Severe Malnutrition related to chronic illness as evidenced by severe muscle depletion, severe fat depletion, percent weight loss (has lost 34 lbs, 19% in 10 months). - Ongoing   GOAL:    Patient will meet greater than or equal to 90% of their needs - Meeting via TF  Olivia Kenning, RD Registered Dietitian  See Amion for more information
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
## 2023-11-12 NOTE — Progress Notes (Incomplete)
 Daily Progress Note  DOA: 10/23/2023 Hospital Day: 21  Cc:  Anemia, FOBT+  ASSESSMENT    78 yo male  with function paraplegia admitted nearly 3 weeks ago with sepsis , encephalopathy, osteomyelitis of stage IV sacral decubitus and a UTI.  Source of sepsis suspected to be osteomyelitis of the pelvis. ID were consulted. He is not a candidate for surgical repair of his wound as he is unable to offload. Image guided aspiration was attempted for culture, but unsuccessful. Blood cultures negative. Urine culture Enterobacter. Antibiotics completed  Encephalopathy / history of dementia He answers many questions appropriately but other he is unable to answer at all. Tried several times to ask about what type of food he has been eating ( tells me he is eating) but he cannot name anything type of food. He is clear about the fluids he is drinking including Ensure   Worsening of chronic [place] anemia and FOBT+ on Asa, plavix and Lovenox  Baseline hgb mid 8 to mid 10.  Presenting hgb this admission on 10/8 at baseline ( 8.5) but it declined to 7.1 the next day. Got a unit of blood with better than expected rise in hgb to ~ 10. Since then hgb has slowly drifted in absence of overt GI bleeding. BUN markedly elevated.in 70's.  Has chronically elevated BUN but generally in 20-30's.  Blood transfusions:  10/24/23  >>> 1 unit 11/12/23 >>> 1 unit in progress  TODAY: Hgb has continued to decline, down further overnight from 7.1 to 6.5. Getting a unit of blood    Renal tubular acidosis   Acute CVA Plavix started this admission ( last dose was today 10/27) Found on MRI this admission when mental status worsened. CTA showed no significant atherosclerosis, carotids clear. EEG showed diffuse encephalopathy only. Echo without cardiogenic source. Was taking full daily dose of ASA at home.   Dysphagia / poor PO intake Malnutrition FTT Can tolerate a Dysphagia I diet but doesn't want to eat it. Drinking a lot of  fluids. Has Cotrak and getting enteral feedings. Calorie count in progress. Family wanted G tube. IR evaluated and per their consult note there was  no window to safely perform procedure due to overlying anatomy including ribs and colon. Additionally unlikely that patient would tolerate the care associated with a g tube based on mental status and distress caused by even minimal abdominal exam. IR is unable to offer percutaneous gastrostomy tube.    See PMH for any additional medical history  / medical problems   Principal Problem:   Sepsis (HCC) Active Problems:   Lower urinary tract infectious disease   Ulcer of right ankle (HCC)   Hyperkalemia   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HCC)   Essential hypertension   Diabetes mellitus without complication (HCC)   HLD (hyperlipidemia)   Iron  deficiency anemia   Paraplegia (HCC)   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)   Malnutrition of moderate degree   Anemia of chronic disease   Neurocognitive deficits   Renal tubular acidosis, type 4   Sacral osteomyelitis (HCC)   Catheter-associated urinary tract infection   Decubitus ulcer of sacral region, stage 4 (HCC)   Protein-calorie malnutrition, severe   Palliative care by specialist    PLAN   Blood transfusion in progress. At this point we do not plan to proceed with endoscopic evaluation.   Subjective   No physical complaints.  Objective    Recent Labs    11/10/23 0454 11/12/23 0307 11/12/23  0811  WBC 7.9 9.1  --   HGB 7.1* 6.6* 6.5*  HCT 22.8* 21.2* 20.8*  MCV 90.5 90.2  --   PLT 261 304  --    No results for input(s): FOLATE, VITAMINB12, FERRITIN, TIBC, IRONPCTSAT in the last 72 hours. Recent Labs    11/10/23 0454 11/11/23 0609 11/12/23 0307  NA 138 137 134*  K 4.7 5.6* 6.2*  CL 105 104 105  CO2 23 25 22   GLUCOSE 322* 215* 203*  BUN 85* 74* 74*  CREATININE 0.65 0.53* 0.64  CALCIUM  8.5* 8.1* 7.9*   Recent Labs    11/10/23 0454 11/11/23 0609  11/12/23 0307  ALBUMIN 1.7* 1.6* 1.8*     Imaging:  CT ANGIO HEAD NECK W WO CM EXAM: CTA Head and Neck with Intravenous Contrast. CT Head without Contrast.  CLINICAL HISTORY: Neuro deficit, acute, stroke suspected. 75ml Omni 350, Stroke, incidental finding: right MCA/PCA small watershed infarct, etiology: Possible small vessel disease in the setting of hypotension, Sepsis.  TECHNIQUE: Axial CTA images of the head and neck performed with intravenous contrast. MIP reconstructed images were created and reviewed. Axial computed tomography images of the head/brain performed without intravenous contrast.  Note: Per PQRS, the description of internal carotid artery percent stenosis, including 0 percent or normal exam, is based on North American Symptomatic Carotid Endarterectomy Trial (NASCET) criteria. Dose reduction technique was used including one or more of the following: automated exposure control, adjustment of mA and kV according to patient size, and/or iterative reconstruction.  CONTRAST: With and without.  COMPARISON: Comparison made with prior MRI from 10/27/2023.  FINDINGS:  CT HEAD: BRAIN: Mild age related cerebral atrophy. Patchy hypodensity involving the supratentorial cerebral white matter, consistent with chronic small vessel ischemic disease, advanced in nature. Chronic bilateral cerebellar infarcts noted. Previously identified acute right occipital lobe infarct again seen, grossly stable. No significant mass effect or associated hemorrhage. Scattered calcified atherosclerosis present about the skull base. No acute intraparenchymal hemorrhage. No mass lesion. No CT evidence for acute territorial infarct. No midline shift or extra-axial collection.  VENTRICLES: No hydrocephalus.  ORBITS: The orbits are unremarkable.  SINUSES AND MASTOIDS: The paranasal sinuses are clear. Large left mastoid effusion.  OTHER (CT HEAD): Nasogastric tube in  place.  CTA NECK: COMMON CAROTID ARTERIES: Right common carotid artery is tortuous but patent without stenosis or dissection. Left common carotid artery is tortuous but patent without stenosis or dissection. No significant stenosis. No dissection or occlusion.  INTERNAL CAROTID ARTERIES: Right internal carotid artery is tortuous but patent without stenosis or dissection. Left internal carotid artery is tortuous but patent without stenosis or dissection. No stenosis by NASCET criteria. No dissection or occlusion.  VERTEBRAL ARTERIES: Both vertebral arteries are present in the subclavian arteries. Left vertebral artery dominant. Vertebral arteries patent without stenosis or dissection. No significant stenosis. No dissection or occlusion.  OTHER (CTA NECK): Visualized aortic arch within normal limits for caliber. Bone marrow branching pattern. No stenosis about the original gray vessels.  CTA HEAD: ANTERIOR CEREBRAL ARTERIES: No significant stenosis. No occlusion. No aneurysm.  MIDDLE CEREBRAL ARTERIES: No significant stenosis. No occlusion. No aneurysm.  POSTERIOR CEREBRAL ARTERIES: No significant stenosis. No occlusion. No aneurysm.  BASILAR ARTERY: No significant stenosis. No occlusion. No aneurysm.  OTHER: SOFT TISSUES: 1.6 cm sebaceous cyst noted within the subcutaneous fat of the left upper posterior neck. 1.2 cm right thyroid nodule, of doubtful significance given size and patient age, no follow up imaging recommended. Right sided central venous  catheter in place. No acute finding. No masses or lymphadenopathy.  BONES: No worrisome osseous lesions. Prior anterior and posterior effusion at C5-C6. Patient is edentulous. No acute osseous abnormality.  LUNGS: 4 mm pulmonary nodule present at the left lung apex (series 11, image 141), indeterminate.  IMPRESSION: 1. Stable previously identified acute right occipital lobe infarct without significant mass effect or  associated hemorrhage. 2. Underlying advanced chronic small vessel ischemic disease, with chronic bilateral cerebellar infarcts. 3. Negative CTA of the head and neck. No large vessel occlusion or other emergent findings. No hemodynamically significant or correctable stenosis. 4. 4 mm left upper lobe pulmonary nodule, indeterminate. If the patient is low risk, no follow-up imaging is recommended. If the patient is high risk, a repeat chest CT at 12 months is optional.  Electronically signed by: Morene Hoard MD 10/30/2023 07:36 PM EDT RP Workstation: [id number] IR TUNNELED CENTRAL VENOUS CATH PLC W IMG INDICATION: needs central line, PICC cannot be placed by PICC team. Supposed to transition to comfort care but family reversed everything so now in need for IV access  EXAM: ULTRASOUND AND FLUOROSCOPIC GUIDED PLACEMENT OF TUNNELED CENTRAL VENOUS POWERLINE CATHETER  MEDICATIONS: None.  ANESTHESIA/SEDATION: Local anesthetic was administered.  The patient was continuously monitored during the procedure by the interventional radiology nurse under my direct supervision.  FLUOROSCOPY: Radiation Exposure Index and estimated peak skin dose (PSD);  Reference air kerma (RAK), 1.6 mGy.  COMPLICATIONS: None immediate.  PROCEDURE: Informed written consent was obtained from the patient and/or patient's representative after a discussion of the risks, benefits, and alternatives to treatment. Questions regarding the procedure were encouraged and answered. The RIGHT neck and chest were prepped with chlorhexidine  in a sterile fashion, and a sterile drape was applied covering the operative field. Maximum barrier sterile technique with sterile gowns and gloves were used for the procedure. A timeout was performed prior to the initiation of the procedure.  After the overlying soft tissues were anesthetized, a small venotomy incision was created and a micropuncture kit was utilized to  access the internal jugular vein. Real-time ultrasound guidance was utilized for vascular access including the acquisition of a permanent ultrasound image documenting patency of the accessed vessel. The microwire was utilized to measure appropriate catheter length. The micropuncture sheath was exchanged for a peel-away sheath over a guidewire. A 5 Fr dual lumen tunneled central venous catheter measuring 24 cm was tunneled in a retrograde fashion from the anterior chest wall to the venotomy incision.  The catheter was then placed through the peel-away sheath with tip ultimately positioned at the superior caval-atrial junction. Final catheter positioning was confirmed and documented with a spot radiographic image. The catheter aspirates and flushes normally. The catheter was flushed with appropriate volume heparin  dwells.  The catheter exit site was secured with a 2-0 Ethilon retention suture. The venotomy incision was closed with Dermabond. Dressings were applied. The patient tolerated the procedure well without immediate post procedural complication.  FINDINGS: After catheter placement, the tip lies within the superior apect of the right atrium. The catheter aspirates and flushes normally and is ready for immediate use.  IMPRESSION: Successful placement of 24 cm dual lumen tunneled central venous Powerline catheter via the RIGHT internal jugular vein  The tip of the catheter is positioned within the proximal RIGHT atrium. The catheter is ready for immediate use.  Thom Hall, MD  [facility]  [facility]  Electronically Signed   By: Thom Hall M.D.   On: 10/30/2023  16:49 DG Abd Portable 1V EXAM: 1 VIEW XRAY OF THE ABDOMEN 10/30/2023 02:34:00 PM  COMPARISON: None available.  CLINICAL HISTORY: Encounter for feeding tube placement. Reason for exam: encounter for feeding tube placement.  FINDINGS:  LINES, TUBES AND  DEVICES: Distal tip of the feeding tube is seen in proximal stomach.  BOWEL: Nonobstructive bowel gas pattern.  SOFT TISSUES: No opaque urinary calculi.  BONES: No acute osseous abnormality.  IMPRESSION: 1. Feeding tube tip terminates in the proximal stomach.  Electronically signed by: Lynwood Seip MD 10/30/2023 02:54 PM EDT RP Workstation: [id number]     Scheduled inpatient medications:   antiseptic oral rinse  15 mL Topical BID   ascorbic acid  500 mg Per Tube Daily   Chlorhexidine  Gluconate Cloth  6 each Topical Q0600   feeding supplement  237 mL Oral BID BM   feeding supplement (PROSource TF20)  60 mL Per Tube BID   free water  200 mL Per Tube Q6H   insulin  aspart  0-9 Units Subcutaneous Q4H   insulin  glargine-yfgn  20 Units Subcutaneous Daily   megestrol  400 mg Per Tube BID   multivitamin with minerals  1 tablet Per Tube Daily   pantoprazole  (PROTONIX ) IV  40 mg Intravenous Q12H   sodium zirconium cyclosilicate   10 g Oral Daily   sodium zirconium cyclosilicate   10 g Oral BID   zinc  sulfate (50mg  elemental zinc )  220 mg Per Tube Daily   Continuous inpatient infusions:   feeding supplement (JEVITY 1.5 CAL/FIBER) 60 mL/hr at 11/11/23 2235   PRN inpatient medications: acetaminophen  **OR** acetaminophen , bisacodyl , HYDROmorphone  (DILAUDID ) injection  Vital signs in last 24 hours: Temp:  [97.7 F (36.5 C)-98.8 F (37.1 C)] 97.7 F (36.5 C) (10/28 0911) Pulse Rate:  [99-106] 106 (10/28 0911) Resp:  [17-18] 17 (10/28 0911) BP: (113-127)/(52-75) 125/75 (10/28 0911) SpO2:  [97 %-100 %] 100 % (10/28 0911) Last BM Date : 11/10/23  Intake/Output Summary (Last 24 hours) at 11/12/2023 1107 Last data filed at 11/12/2023 9187 Gross per 24 hour  Intake 3680 ml  Output 2075 ml  Net 1605 ml    Intake/Output from previous day: 10/27 0701 - 10/28 0700 In: 3560 [P.O.:1080; NG/GT:2480] Out: 2075 [Urine:2075] Intake/Output this shift: Total I/O In: 120 [P.O.:120] Out: -     Physical Exam:  General: Alert male in NAD. Cortrak in place Heart:  Regular rate and rhythm.  Pulmonary: Normal respiratory effort Abdomen: Soft, mildly distended, nontender. Normal bowel sounds. Neurologic: Alert, some confusion Psych: Pleasant. Cooperative     LOS: 19 days   Vina Dasen ,NP 11/12/2023, 11:07 AM

## 2023-11-12 NOTE — Progress Notes (Signed)
 [facility] ROUNDING NOTE   Subjective: Patient has no complaints.  Pleasantly demented.  Patient's daughter is at bedside helping with feeding.  Reports that he passed a nonbloody stool earlier today.  His hemoglobin did drift to 6.6 prompting a 1 unit PRBC.  Continues to have poor appetite/disinterest in food   Objective: Vital signs in last 24 hours: Temp:  [97.7 F (36.5 C)-98.8 F (37.1 C)] 98.4 F (36.9 C) (10/28 1143) Pulse Rate:  [103-106] 103 (10/28 1143) Resp:  [17-19] 19 (10/28 1143) BP: (114-132)/(55-75) 132/67 (10/28 1143) SpO2:  [97 %-100 %] 100 % (10/28 1143) Last BM Date : 11/10/23 General: NAD, pleasantly demented African-American male, chronically ill, feeding tube in right near Lungs:  CTA b/l, no w/r/r Heart:  RRR, no m/r/g Abdomen:  Soft, NT, ND, +BS Ext: Status post right forearm amputation Left upper extremity wrapped in gauze    Intake/Output from previous day: 10/27 0701 - 10/28 0700 In: 3560 [P.O.:1080; NG/GT:2480] Out: 2075 [Urine:2075] Intake/Output this shift: No intake/output data recorded.   Lab Results: Recent Labs    11/10/23 0454 11/12/23 0307 11/12/23 0811 11/12/23 1341  WBC 7.9 9.1  --  9.7  HGB 7.1* 6.6* 6.5* 8.0*  PLT 261 304  --  302  MCV 90.5 90.2  --  90.8   BMET Recent Labs    11/10/23 0454 11/11/23 0609 11/12/23 0307  NA 138 137 134*  K 4.7 5.6* 6.2*  CL 105 104 105  CO2 23 25 22   GLUCOSE 322* 215* 203*  BUN 85* 74* 74*  CREATININE 0.65 0.53* 0.64  CALCIUM  8.5* 8.1* 7.9*   LFT Recent Labs    11/10/23 0454 11/11/23 0609 11/12/23 0307  ALBUMIN 1.7* 1.6* 1.8*   PT/INR No results for input(s): INR in the last 72 hours.    Imaging/Other results: No results found.    Assessment and Plan:  78 year old male with longstanding functional paraplegia admitted 3 weeks ago with sepsis attributed to stage IV sacral decubitus ulcer/osteomyelitis, with hospitalization complicated by severe  encephalopathy and NSTEMI and stroke.  Patient's mental status has improved back to his baseline level of dementia, but he continues to struggle with poor appetite and disinterest in food prompting nasoenteric feeding and consideration for gastrostomy.  GI consulted because of declining hemoglobin.  He had a positive fecal occult blood test on admission.  He has not had overt GI bleeding.    Acute on chronic anemia, positive FOBT Although low-grade GI bleed likely, it is unclear that he is having focal GI bleed that would be amenable to endoscopic intervention.  I feel that endoscopy at this point would be much more likely to be diagnostic and therapeutic.  Given his poor functional status and dementia, I would favor conservative therapy for possible GI bleed.  Should he develop overt bleeding, or require recurrent transfusions, the risk-benefit ratio may then favor endoscopy.  For now, would recommend continued observation and monitoring. - Continue Protonix  - Continue monitoring/documenting stool appearance - Will reconsider risk/benefits of endoscopy based on hemoglobin trend/transfusion requirement/stool appearance.   Poor p.o. intake/malnutrition Patient denies symptoms of abdominal pain, nausea or vomiting.  He just does not want to eat.  This is usually a sign of advanced dementia and general decline.  Would recommend against gastrostomy tube.  Reasonable to consider continue nasoenteric feeding for a period longer. - Not a candidate for IR based gastrostomy tube; would need surgery -Would recommend against gastrostomy tube placement  Acute CVA Reported as likely  incidental finding, not felt to be related to patient's encephalopathy Plavix added onto patient's aspirin  during this hospitalization.  This is likely contributing to a low-grade GI bleed  Failure to thrive/goals of care Continue to reengage with patient's daughter.  Patient has dementia with significant comorbidities and very  poor functional status.  Sepsis secondary to stage IV decubitus ulcer Resolved  Glendia FORBES Holt, MD  11/12/2023, 7:21 PM [facility]  I spent a total of 30 minutes reviewing the patient's medical record, interviewing and examining the patient, discussing his diagnosis and management of his condition going forward, and documenting in the medical record

## 2023-11-12 NOTE — Plan of Care (Signed)
  Problem: Metabolic: Goal: Ability to maintain appropriate glucose levels will improve Outcome: Progressing   Problem: Nutritional: Goal: Maintenance of adequate nutrition will improve Outcome: Progressing   Problem: Skin Integrity: Goal: Risk for impaired skin integrity will decrease Outcome: Progressing   Problem: Skin Integrity: Goal: Risk for impaired skin integrity will decrease Outcome: Progressing

## 2023-11-13 DIAGNOSIS — A419 Sepsis, unspecified organism: Secondary | ICD-10-CM | POA: Diagnosis not present

## 2023-11-13 DIAGNOSIS — E43 Unspecified severe protein-calorie malnutrition: Secondary | ICD-10-CM | POA: Diagnosis not present

## 2023-11-13 DIAGNOSIS — R627 Adult failure to thrive: Secondary | ICD-10-CM | POA: Diagnosis not present

## 2023-11-13 DIAGNOSIS — R532 Functional quadriplegia: Secondary | ICD-10-CM | POA: Diagnosis not present

## 2023-11-13 DIAGNOSIS — Z515 Encounter for palliative care: Secondary | ICD-10-CM | POA: Diagnosis not present

## 2023-11-13 LAB — CBC
HCT: 25.3 % — ABNORMAL LOW (ref 39.0–52.0)
Hemoglobin: 8 g/dL — ABNORMAL LOW (ref 13.0–17.0)
MCH: 28.7 pg (ref 26.0–34.0)
MCHC: 31.6 g/dL (ref 30.0–36.0)
MCV: 90.7 fL (ref 80.0–100.0)
Platelets: 280 K/uL (ref 150–400)
RBC: 2.79 MIL/uL — ABNORMAL LOW (ref 4.22–5.81)
RDW: 18.4 % — ABNORMAL HIGH (ref 11.5–15.5)
WBC: 8.5 K/uL (ref 4.0–10.5)
nRBC: 0.5 % — ABNORMAL HIGH (ref 0.0–0.2)

## 2023-11-13 LAB — CK: Total CK: 45 U/L — ABNORMAL LOW (ref 49–397)

## 2023-11-13 LAB — GLUCOSE, CAPILLARY
Glucose-Capillary: 167 mg/dL — ABNORMAL HIGH (ref 70–99)
Glucose-Capillary: 173 mg/dL — ABNORMAL HIGH (ref 70–99)
Glucose-Capillary: 170 mg/dL — ABNORMAL HIGH (ref 70–99)
Glucose-Capillary: 223 mg/dL — ABNORMAL HIGH (ref 70–99)
Glucose-Capillary: 241 mg/dL — ABNORMAL HIGH (ref 70–99)

## 2023-11-13 LAB — TYPE AND SCREEN
ABO/RH(D): O POS
Antibody Screen: NEGATIVE
Unit division: 0

## 2023-11-13 LAB — BPAM RBC
Blood Product Expiration Date: 202511232359
ISSUE DATE / TIME: 202510280844
Unit Type and Rh: 5100

## 2023-11-13 MED ORDER — JEVITY 1.5 CAL/FIBER PO LIQD
1000.0000 mL | ORAL | Status: DC
  Filled 2023-11-13: qty 1000

## 2023-11-13 MED ORDER — JUVEN PO PACK
1.0000 | PACK | Freq: Two times a day (BID) | ORAL | Status: DC
  Administered 2023-11-14 – 2023-11-16 (×5): 1
  Filled 2023-11-13 (×5): qty 1

## 2023-11-13 MED ORDER — JEVITY 1.5 CAL/FIBER PO LIQD
1000.0000 mL | ORAL | Status: DC
  Administered 2023-11-13 – 2023-11-15 (×3): 1000 mL
  Filled 2023-11-13 (×4): qty 1000

## 2023-11-13 MED ORDER — ENSURE PLUS HIGH PROTEIN PO LIQD
237.0000 mL | Freq: Three times a day (TID) | ORAL | Status: DC
  Administered 2023-11-13 – 2023-11-14 (×3): 237 mL via ORAL
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
## 2023-11-13 NOTE — Plan of Care (Signed)
  Problem: Skin Integrity: Goal: Risk for impaired skin integrity will decrease Outcome: Progressing   Problem: Tissue Perfusion: Goal: Adequacy of tissue perfusion will improve Outcome: Progressing   Problem: Coping: Goal: Level of anxiety will decrease Outcome: Progressing

## 2023-11-13 NOTE — Progress Notes (Addendum)
 Nutrition Brief Note  Received message to transition to nocturnal tube feeds to hopefully stimulate appetite. I do not suspect pt is getting full from his tube feeds rather he has no interest in food in general. Last calorie count 10/26-10/27 pt was eating 0% of all his meals usually pt's on tube feeds will have some desire to eat even if it is bites-25% and pt is not.  Will monitor po intake. IR/Surgery cannot place PEG. Unlikely pt will be able meet nutritional needs by po. follow along for GOC.   INTERVENTION:  Transition to nocturnal tube feeding via Cortrak: Jevity 1.5 at 83 ml/hr x 12 hours from 1800-0600 (1000 ml per day) Prosource TF20 60 ml BID 200 ml FWF Q6H per MD   Provides 1660 kcal (92% calorie needs), 103 gm protein (86% protein needs), 760 ml free water daily (1560 ml water daily TF + FWF)   MVI with minerals daily per tube 500 mg Vitamin C daily per tube x 30 days for wound healing  1 packet Juven BID,  to support wound healing Follow along for GOC    Encourage PO intake, now on Dysphagia 1 diet with thin liquids  Nursing to assist with meals House trays  Magic cup TID with meals, each supplement provides 290 kcal and 9 grams of protein Ensure Plus High Protein po BID, each supplement provides 350 kcal and 20 grams of protein Completed Zinc  supplementation   Estimated Nutritional Needs:  Kcal:  1800-2100 kcal Protein:  120-140 gm Fluid:  >2L/day   NUTRITION DIAGNOSIS:    Severe Malnutrition related to chronic illness as evidenced by severe muscle depletion, severe fat depletion, percent weight loss (has lost 34 lbs, 19% in 10 months). - Ongoing   GOAL:    Patient will meet greater than or equal to 90% of their needs - Meeting via TF  Olivia Kenning, RD Registered Dietitian  See Amion for more information
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
## 2023-11-14 DIAGNOSIS — A419 Sepsis, unspecified organism: Secondary | ICD-10-CM | POA: Diagnosis not present

## 2023-11-14 LAB — GLUCOSE, CAPILLARY
Glucose-Capillary: 251 mg/dL — ABNORMAL HIGH (ref 70–99)
Glucose-Capillary: 300 mg/dL — ABNORMAL HIGH (ref 70–99)
Glucose-Capillary: 238 mg/dL — ABNORMAL HIGH (ref 70–99)
Glucose-Capillary: 261 mg/dL — ABNORMAL HIGH (ref 70–99)
Glucose-Capillary: 233 mg/dL — ABNORMAL HIGH (ref 70–99)

## 2023-11-14 NOTE — Progress Notes (Signed)
 Patient refused his CBG monitoring.

## 2023-11-14 NOTE — Plan of Care (Signed)
  Problem: Nutritional: Goal: Maintenance of adequate nutrition will improve Outcome: Progressing   Problem: Nutritional: Goal: Progress toward achieving an optimal weight will improve Outcome: Progressing   Problem: Clinical Measurements: Goal: Will remain free from infection Outcome: Progressing   Problem: Safety: Goal: Ability to remain free from injury will improve Outcome: Progressing

## 2023-11-14 NOTE — Progress Notes (Signed)
 Tube feeding started.
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
## 2023-11-14 NOTE — Plan of Care (Signed)
 All dressings changed.  Pt with poor p.o intake.  Tube feeding infusing.   Problem: Education: Goal: Ability to describe self-care measures that may prevent or decrease complications (Diabetes Survival Skills Education) will improve Outcome: Progressing Goal: Individualized Educational Video(s) Outcome: Progressing   Problem: Coping: Goal: Ability to adjust to condition or change in health will improve Outcome: Progressing   Problem: Fluid Volume: Goal: Ability to maintain a balanced intake and output will improve Outcome: Progressing

## 2023-11-15 DIAGNOSIS — A419 Sepsis, unspecified organism: Secondary | ICD-10-CM | POA: Diagnosis not present

## 2023-11-15 LAB — CULTURE, FUNGUS WITHOUT SMEAR

## 2023-11-15 LAB — GLUCOSE, CAPILLARY
Glucose-Capillary: 152 mg/dL — ABNORMAL HIGH (ref 70–99)
Glucose-Capillary: 184 mg/dL — ABNORMAL HIGH (ref 70–99)
Glucose-Capillary: 211 mg/dL — ABNORMAL HIGH (ref 70–99)
Glucose-Capillary: 212 mg/dL — ABNORMAL HIGH (ref 70–99)
Glucose-Capillary: 233 mg/dL — ABNORMAL HIGH (ref 70–99)
Glucose-Capillary: 290 mg/dL — ABNORMAL HIGH (ref 70–99)
Glucose-Capillary: 319 mg/dL — ABNORMAL HIGH (ref 70–99)

## 2023-11-15 NOTE — Plan of Care (Signed)
 Dressing changed. No complaints of pain. Will start TF @ 1800. Problem: Education: Goal: Ability to describe self-care measures that may prevent or decrease complications (Diabetes Survival Skills Education) will improve Outcome: Progressing Goal: Individualized Educational Video(s) Outcome: Progressing   Problem: Coping: Goal: Ability to adjust to condition or change in health will improve Outcome: Progressing   Problem: Fluid Volume: Goal: Ability to maintain a balanced intake and output will improve Outcome: Progressing   Problem: Health Behavior/Discharge Planning: Goal: Ability to identify and utilize available resources and services will improve Outcome: Progressing Goal: Ability to manage health-related needs will improve Outcome: Progressing   Problem: Metabolic: Goal: Ability to maintain appropriate glucose levels will improve Outcome: Progressing   Problem: Nutritional: Goal: Maintenance of adequate nutrition will improve Outcome: Progressing Goal: Progress toward achieving an optimal weight will improve Outcome: Progressing   Problem: Skin Integrity: Goal: Risk for impaired skin integrity will decrease Outcome: Progressing   Problem: Tissue Perfusion: Goal: Adequacy of tissue perfusion will improve Outcome: Progressing   Problem: Education: Goal: Knowledge of General Education information will improve Description: Including pain rating scale, medication(s)/side effects and non-pharmacologic comfort measures Outcome: Progressing   Problem: Health Behavior/Discharge Planning: Goal: Ability to manage health-related needs will improve Outcome: Progressing   Problem: Clinical Measurements: Goal: Ability to maintain clinical measurements within normal limits will improve Outcome: Progressing Goal: Will remain free from infection Outcome: Progressing Goal: Diagnostic test results will improve Outcome: Progressing Goal: Respiratory complications will  improve Outcome: Progressing Goal: Cardiovascular complication will be avoided Outcome: Progressing   Problem: Activity: Goal: Risk for activity intolerance will decrease Outcome: Progressing   Problem: Nutrition: Goal: Adequate nutrition will be maintained Outcome: Progressing   Problem: Coping: Goal: Level of anxiety will decrease Outcome: Progressing   Problem: Elimination: Goal: Will not experience complications related to bowel motility Outcome: Progressing Goal: Will not experience complications related to urinary retention Outcome: Progressing   Problem: Pain Managment: Goal: General experience of comfort will improve and/or be controlled Outcome: Progressing   Problem: Safety: Goal: Ability to remain free from injury will improve Outcome: Progressing   Problem: Skin Integrity: Goal: Risk for impaired skin integrity will decrease Outcome: Progressing   Problem: Education: Goal: Knowledge of disease or condition will improve Outcome: Progressing Goal: Knowledge of secondary prevention will improve (MUST DOCUMENT ALL) Outcome: Progressing Goal: Knowledge of patient specific risk factors will improve (DELETE if not current risk factor) Outcome: Progressing   Problem: Ischemic Stroke/TIA Tissue Perfusion: Goal: Complications of ischemic stroke/TIA will be minimized Outcome: Progressing   Problem: Coping: Goal: Will verbalize positive feelings about self Outcome: Progressing Goal: Will identify appropriate support needs Outcome: Progressing   Problem: Health Behavior/Discharge Planning: Goal: Ability to manage health-related needs will improve Outcome: Progressing Goal: Goals will be collaboratively established with patient/family Outcome: Progressing   Problem: Self-Care: Goal: Ability to participate in self-care as condition permits will improve Outcome: Progressing Goal: Verbalization of feelings and concerns over difficulty with self-care will  improve Outcome: Progressing Goal: Ability to communicate needs accurately will improve Outcome: Progressing   Problem: Nutrition: Goal: Risk of aspiration will decrease Outcome: Progressing Goal: Dietary intake will improve Outcome: Progressing   Problem: Education: Goal: Knowledge of the prescribed therapeutic regimen will improve Outcome: Progressing   Problem: Coping: Goal: Ability to identify and develop effective coping behavior will improve Outcome: Progressing   Problem: Clinical Measurements: Goal: Quality of life will improve Outcome: Progressing   Problem: Respiratory: Goal: Verbalizations of increased ease of respirations will increase  Outcome: Progressing   Problem: Role Relationship: Goal: Family's ability to cope with current situation will improve Outcome: Progressing Goal: Ability to verbalize concerns, feelings, and thoughts to partner or family member will improve Outcome: Progressing   Problem: Pain Management: Goal: Satisfaction with pain management regimen will improve Outcome: Progressing

## 2023-11-15 NOTE — Progress Notes (Signed)
 Patient followed peripherally over the past few days.  It appears that there have not been any concerns for active GI bleeding recently.  Agree with recommendation to transition to hospice care given very poor prognosis and quality of life.  GI will sign off at this time.  Please reconsult as needed.
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
## 2023-11-16 DIAGNOSIS — R532 Functional quadriplegia: Secondary | ICD-10-CM | POA: Diagnosis not present

## 2023-11-16 DIAGNOSIS — R627 Adult failure to thrive: Secondary | ICD-10-CM

## 2023-11-16 DIAGNOSIS — E43 Unspecified severe protein-calorie malnutrition: Secondary | ICD-10-CM | POA: Diagnosis not present

## 2023-11-16 DIAGNOSIS — Z515 Encounter for palliative care: Secondary | ICD-10-CM | POA: Diagnosis not present

## 2023-11-16 LAB — CBC
HCT: 29.1 % — ABNORMAL LOW (ref 39.0–52.0)
Hemoglobin: 8.9 g/dL — ABNORMAL LOW (ref 13.0–17.0)
MCH: 29.3 pg (ref 26.0–34.0)
MCHC: 30.6 g/dL (ref 30.0–36.0)
MCV: 95.7 fL (ref 80.0–100.0)
Platelets: 362 K/uL (ref 150–400)
RBC: 3.04 MIL/uL — ABNORMAL LOW (ref 4.22–5.81)
RDW: 20.4 % — ABNORMAL HIGH (ref 11.5–15.5)
WBC: 10 K/uL (ref 4.0–10.5)
nRBC: 0 % (ref 0.0–0.2)

## 2023-11-16 LAB — COMPREHENSIVE METABOLIC PANEL WITH GFR
ALT: 23 U/L (ref 0–44)
AST: 20 U/L (ref 15–41)
Albumin: 2.2 g/dL — ABNORMAL LOW (ref 3.5–5.0)
Alkaline Phosphatase: 130 U/L — ABNORMAL HIGH (ref 38–126)
Anion gap: 9 (ref 5–15)
BUN: 71 mg/dL — ABNORMAL HIGH (ref 8–23)
CO2: 19 mmol/L — ABNORMAL LOW (ref 22–32)
Calcium: 8.8 mg/dL — ABNORMAL LOW (ref 8.9–10.3)
Chloride: 107 mmol/L (ref 98–111)
Creatinine, Ser: 0.83 mg/dL (ref 0.61–1.24)
GFR, Estimated: >60 mL/min (ref >60)
Glucose, Bld: 319 mg/dL — ABNORMAL HIGH (ref 70–99)
Potassium: >7.5 mmol/L (ref 3.5–5.1)
Sodium: 135 mmol/L (ref 135–145)
Total Bilirubin: 0.7 mg/dL (ref 0.0–1.2)
Total Protein: 7.6 g/dL (ref 6.5–8.1)

## 2023-11-16 LAB — GLUCOSE, CAPILLARY
Glucose-Capillary: 118 mg/dL — ABNORMAL HIGH (ref 70–99)
Glucose-Capillary: 122 mg/dL — ABNORMAL HIGH (ref 70–99)
Glucose-Capillary: 123 mg/dL — ABNORMAL HIGH (ref 70–99)
Glucose-Capillary: 136 mg/dL — ABNORMAL HIGH (ref 70–99)
Glucose-Capillary: 245 mg/dL — ABNORMAL HIGH (ref 70–99)
Glucose-Capillary: 275 mg/dL — ABNORMAL HIGH (ref 70–99)
Glucose-Capillary: 76 mg/dL (ref 70–99)
Glucose-Capillary: 93 mg/dL (ref 70–99)

## 2023-11-16 LAB — POTASSIUM
Potassium: >7.5 mmol/L (ref 3.5–5.1)
Potassium: 6.7 mmol/L (ref 3.5–5.1)

## 2023-11-16 MED ORDER — INSULIN ASPART 100 UNIT/ML IJ SOLN
0.0000 [IU] | INTRAMUSCULAR | Status: DC
  Administered 2023-11-16 – 2023-11-17 (×4): 2 [IU] via SUBCUTANEOUS
  Administered 2023-11-18: 3 [IU] via SUBCUTANEOUS
  Administered 2023-11-18: 1 [IU] via SUBCUTANEOUS
  Administered 2023-11-18 (×2): 2 [IU] via SUBCUTANEOUS
  Administered 2023-11-18: 3 [IU] via SUBCUTANEOUS
  Administered 2023-11-19 (×3): 2 [IU] via SUBCUTANEOUS
  Administered 2023-11-20: 3 [IU] via SUBCUTANEOUS
  Administered 2023-11-20: 2 [IU] via SUBCUTANEOUS
  Administered 2023-11-20: 3 [IU] via SUBCUTANEOUS
  Administered 2023-11-20: 5 [IU] via SUBCUTANEOUS
  Administered 2023-11-20: 2 [IU] via SUBCUTANEOUS
  Administered 2023-11-21 – 2023-11-22 (×7): 3 [IU] via SUBCUTANEOUS
  Administered 2023-11-22: 5 [IU] via SUBCUTANEOUS
  Administered 2023-11-22 – 2023-11-24 (×10): 3 [IU] via SUBCUTANEOUS
  Administered 2023-11-24 (×2): 2 [IU] via SUBCUTANEOUS
  Administered 2023-11-24 (×2): 3 [IU] via SUBCUTANEOUS
  Administered 2023-11-24: 5 [IU] via SUBCUTANEOUS
  Administered 2023-11-25 (×2): 3 [IU] via SUBCUTANEOUS
  Administered 2023-11-25 (×2): 2 [IU] via SUBCUTANEOUS
  Administered 2023-11-25: 3 [IU] via SUBCUTANEOUS
  Administered 2023-11-25: 2 [IU] via SUBCUTANEOUS
  Administered 2023-11-26 (×4): 3 [IU] via SUBCUTANEOUS
  Administered 2023-11-27 (×2): 2 [IU] via SUBCUTANEOUS
  Administered 2023-11-27 (×2): 3 [IU] via SUBCUTANEOUS
  Administered 2023-11-27 – 2023-11-29 (×7): 2 [IU] via SUBCUTANEOUS
  Administered 2023-11-29: 3 [IU] via SUBCUTANEOUS
  Administered 2023-11-29: 2 [IU] via SUBCUTANEOUS
  Administered 2023-11-30: 3 [IU] via SUBCUTANEOUS
  Administered 2023-12-01 – 2023-12-02 (×3): 2 [IU] via SUBCUTANEOUS
  Administered 2023-12-03: 3 [IU] via SUBCUTANEOUS
  Filled 2023-11-16: qty 1
  Filled 2023-11-16 (×2): qty 3
  Filled 2023-11-16: qty 2
  Filled 2023-11-16: qty 1
  Filled 2023-11-16: qty 3
  Filled 2023-11-16: qty 2
  Filled 2023-11-16: qty 3
  Filled 2023-11-16: qty 5
  Filled 2023-11-16 (×2): qty 3
  Filled 2023-11-16: qty 2
  Filled 2023-11-16: qty 3
  Filled 2023-11-16: qty 2
  Filled 2023-11-16: qty 3
  Filled 2023-11-16 (×2): qty 2
  Filled 2023-11-16: qty 3
  Filled 2023-11-16: qty 2
  Filled 2023-11-16: qty 1
  Filled 2023-11-16 (×2): qty 2
  Filled 2023-11-16 (×3): qty 3
  Filled 2023-11-16 (×2): qty 2
  Filled 2023-11-16: qty 1
  Filled 2023-11-16: qty 3
  Filled 2023-11-16: qty 2
  Filled 2023-11-16: qty 1
  Filled 2023-11-16: qty 2
  Filled 2023-11-16 (×3): qty 3
  Filled 2023-11-16 (×2): qty 1
  Filled 2023-11-16: qty 5
  Filled 2023-11-16: qty 1
  Filled 2023-11-16: qty 5
  Filled 2023-11-16: qty 1
  Filled 2023-11-16: qty 3
  Filled 2023-11-16: qty 5
  Filled 2023-11-16: qty 3
  Filled 2023-11-16: qty 2
  Filled 2023-11-16: qty 3
  Filled 2023-11-16: qty 2
  Filled 2023-11-16: qty 1
  Filled 2023-11-16: qty 2
  Filled 2023-11-16: qty 5
  Filled 2023-11-16: qty 3
  Filled 2023-11-16 (×2): qty 2
  Filled 2023-11-16: qty 3
  Filled 2023-11-16: qty 2
  Filled 2023-11-16 (×2): qty 1
  Filled 2023-11-16: qty 2
  Filled 2023-11-16: qty 3

## 2023-11-16 MED ORDER — SODIUM CHLORIDE 0.9 % IV BOLUS
500.0000 mL | Freq: Once | INTRAVENOUS | Status: AC
  Administered 2023-11-16: 500 mL via INTRAVENOUS

## 2023-11-16 MED ORDER — CALCIUM GLUCONATE-NACL 1-0.675 GM/50ML-% IV SOLN
1.0000 g | Freq: Once | INTRAVENOUS | Status: AC
  Administered 2023-11-16: 1000 mg via INTRAVENOUS
  Filled 2023-11-16: qty 50

## 2023-11-16 MED ORDER — SODIUM ZIRCONIUM CYCLOSILICATE 10 G PO PACK
10.0000 g | PACK | Freq: Three times a day (TID) | ORAL | Status: DC
  Administered 2023-11-16 – 2023-11-17 (×2): 10 g via ORAL
  Filled 2023-11-16 (×2): qty 1

## 2023-11-16 MED ORDER — INSULIN ASPART 100 UNIT/ML IV SOLN
10.0000 [IU] | Freq: Once | INTRAVENOUS | Status: AC
  Administered 2023-11-16: 10 [IU] via INTRAVENOUS

## 2023-11-16 MED ORDER — SODIUM CHLORIDE 0.9 % IV BOLUS
1000.0000 mL | Freq: Once | INTRAVENOUS | Status: AC
  Administered 2023-11-16: 1000 mL via INTRAVENOUS

## 2023-11-16 MED ORDER — FUROSEMIDE 10 MG/ML IJ SOLN
40.0000 mg | Freq: Once | INTRAMUSCULAR | Status: AC
  Administered 2023-11-16: 40 mg via INTRAVENOUS
  Filled 2023-11-16: qty 4

## 2023-11-16 MED ORDER — SODIUM ZIRCONIUM CYCLOSILICATE 10 G PO PACK
10.0000 g | PACK | Freq: Once | ORAL | Status: AC
  Administered 2023-11-16: 10 g via ORAL
  Filled 2023-11-16: qty 1

## 2023-11-16 MED ORDER — SODIUM BICARBONATE 8.4 % IV SOLN
50.0000 meq | Freq: Once | INTRAVENOUS | Status: AC
  Administered 2023-11-16: 50 meq via INTRAVENOUS
  Filled 2023-11-16: qty 50

## 2023-11-16 MED ORDER — ENSURE PLUS HIGH PROTEIN PO LIQD: 237.0000 mL | Freq: Two times a day (BID) | ORAL | Status: DC

## 2023-11-16 MED ORDER — SODIUM ZIRCONIUM CYCLOSILICATE 10 G PO PACK
10.0000 g | PACK | Freq: Once | ORAL | Status: AC
  Administered 2023-11-16: 10 g
  Filled 2023-11-16: qty 1

## 2023-11-16 MED ORDER — ALBUTEROL SULFATE (2.5 MG/3ML) 0.083% IN NEBU
10.0000 mg | INHALATION_SOLUTION | Freq: Once | RESPIRATORY_TRACT | Status: AC
  Administered 2023-11-16: 10 mg via RESPIRATORY_TRACT
  Filled 2023-11-16: qty 12

## 2023-11-16 MED ORDER — PROSOURCE TF20 ENFIT COMPATIBL EN LIQD
60.0000 mL | Freq: Two times a day (BID) | ENTERAL | Status: DC
  Administered 2023-11-17 – 2023-11-19 (×5): 60 mL
  Filled 2023-11-16 (×5): qty 60

## 2023-11-16 MED ORDER — JUVEN PO PACK
1.0000 | PACK | Freq: Two times a day (BID) | ORAL | Status: DC
  Administered 2023-11-19 – 2023-12-03 (×28): 1
  Filled 2023-11-16 (×28): qty 1

## 2023-11-16 MED ORDER — DEXTROSE 50 % IV SOLN
1.0000 | Freq: Once | INTRAVENOUS | Status: AC
  Administered 2023-11-16: 50 mL via INTRAVENOUS
  Filled 2023-11-16: qty 50

## 2023-11-16 MED ORDER — MEGESTROL ACETATE 400 MG/10ML PO SUSP
400.0000 mg | Freq: Two times a day (BID) | ORAL | Status: DC
  Administered 2023-11-19 – 2023-11-21 (×5): 400 mg
  Filled 2023-11-16 (×6): qty 10

## 2023-11-16 MED ORDER — SODIUM ZIRCONIUM CYCLOSILICATE 10 G PO PACK: 10.0000 g | PACK | Freq: Once | ORAL | Status: DC

## 2023-11-16 MED ORDER — FLUDROCORTISONE ACETATE 0.1 MG PO TABS
0.1000 mg | ORAL_TABLET | Freq: Every day | ORAL | Status: DC
  Administered 2023-11-16 – 2023-11-17 (×2): 0.1 mg
  Filled 2023-11-16 (×2): qty 1

## 2023-11-16 MED ORDER — INSULIN GLARGINE-YFGN 100 UNIT/ML ~~LOC~~ SOLN
25.0000 [IU] | Freq: Every day | SUBCUTANEOUS | Status: DC
Start: 2023-11-16 — End: 2023-11-18
  Administered 2023-11-16 – 2023-11-18 (×3): 25 [IU] via SUBCUTANEOUS
  Filled 2023-11-16 (×3): qty 0.25

## 2023-11-16 MED ORDER — FUROSEMIDE 10 MG/ML IJ SOLN
40.0000 mg | Freq: Once | INTRAMUSCULAR | Status: AC
Start: 2023-11-16 — End: 2023-11-16
  Administered 2023-11-16: 40 mg via INTRAVENOUS
  Filled 2023-11-16: qty 4

## 2023-11-16 MED ORDER — SODIUM CHLORIDE 0.9 % IV SOLN: INTRAVENOUS | Status: DC

## 2023-11-16 NOTE — Plan of Care (Signed)
  Problem: Education: Goal: Ability to describe self-care measures that may prevent or decrease complications (Diabetes Survival Skills Education) will improve Outcome: Not Progressing   Problem: Fluid Volume: Goal: Ability to maintain a balanced intake and output will improve Outcome: Not Progressing   Problem: Health Behavior/Discharge Planning: Goal: Ability to identify and utilize available resources and services will improve Outcome: Not Progressing Goal: Ability to manage health-related needs will improve Outcome: Not Progressing   Problem: Metabolic: Goal: Ability to maintain appropriate glucose levels will improve Outcome: Not Progressing   Problem: Education: Goal: Knowledge of the prescribed therapeutic regimen will improve Outcome: Not Progressing   Problem: Coping: Goal: Ability to identify and develop effective coping behavior will improve Outcome: Not Progressing

## 2023-11-16 NOTE — Progress Notes (Signed)
 Provider notified and aware. Daughter at bedside aware.  11/16/23 1811  Vitals  Temp 98.1 F (36.7 C)  Temp Source Oral  BP (!) 81/61  MAP (mmHg) 70  BP Location Left Arm  BP Method Automatic  Patient Position (if appropriate) Lying  Pulse Rate (!) 139  Pulse Rate Source Monitor  Resp 15  MEWS COLOR  MEWS Score Color Red  Oxygen Therapy  SpO2 100 %  O2 Device Room Air  MEWS Score  MEWS Temp 0  MEWS Systolic 1  MEWS Pulse 3  MEWS RR 0  MEWS LOC 0  MEWS Score 4

## 2023-11-16 NOTE — Progress Notes (Signed)
 All dressing changes done per order. Two new sites observed this morning. One on the upper right side and one on the inner side of the right arm. Appears to be where the arm lays next to the chest. Images placed in chart. Charge nurse aware.
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
## 2023-11-16 NOTE — Plan of Care (Signed)
 Upon entering patient's room, patient responds to voice. Patient had a result of critical K >7.5 @ [id number]. MD notified and carried out orders (see MAR). Patient has no complaints of pain. Noticed patient having bilateral arm shaking. Hooked to telemetry. Repositioned q2. Wound care changed. Lab called for another K critical result of >7.5. MD notified and carried out new orders (see MAR). Wound care dressing changed but patient doesn't respond to pain whenever we turn as what he was used to. While RN is in the room, pt verbalized that he might pass, asked pt how he is feeling and he said he is okay. 1530 daughter arrived. Notified the MD, talked to daughter about the updates. MD arrived and talked with the provider. 1600 daughter was asking about the medication that we gave that could cause pt's potassium to be elevated. 1630 HR was sustaining to 140's via tele. MD notified. Charge RN aware as well. VS taken and recorded and MD aware and at bedside talking to the daughter.  1700 pt repositioned and cleaned. Wound care done on his sacrum. Patient has no response to pain. Daughter at bedside aware. 1813 MD notified abt his latest vitals. Patient repositioned to comfort. Warm blanket provided.  Problem: Education: Goal: Ability to describe self-care measures that may prevent or decrease complications (Diabetes Survival Skills Education) will improve Outcome: Not Progressing   Problem: Education: Goal: Individualized Educational Video(s) Outcome: Not Progressing   Problem: Coping: Goal: Ability to adjust to condition or change in health will improve Outcome: Not Progressing   Problem: Health Behavior/Discharge Planning: Goal: Ability to identify and utilize available resources and services will improve Outcome: Not Progressing   Problem: Health Behavior/Discharge Planning: Goal: Ability to manage health-related needs will improve Outcome: Not Progressing

## 2023-11-17 DIAGNOSIS — R532 Functional quadriplegia: Secondary | ICD-10-CM | POA: Diagnosis not present

## 2023-11-17 DIAGNOSIS — Z515 Encounter for palliative care: Secondary | ICD-10-CM | POA: Diagnosis not present

## 2023-11-17 DIAGNOSIS — E43 Unspecified severe protein-calorie malnutrition: Secondary | ICD-10-CM | POA: Diagnosis not present

## 2023-11-17 DIAGNOSIS — R627 Adult failure to thrive: Secondary | ICD-10-CM | POA: Diagnosis not present

## 2023-11-17 LAB — CBC
HCT: 24.1 % — ABNORMAL LOW (ref 39.0–52.0)
Hemoglobin: 7.5 g/dL — ABNORMAL LOW (ref 13.0–17.0)
MCH: 29.4 pg (ref 26.0–34.0)
MCHC: 31.1 g/dL (ref 30.0–36.0)
MCV: 94.5 fL (ref 80.0–100.0)
Platelets: 334 K/uL (ref 150–400)
RBC: 2.55 MIL/uL — ABNORMAL LOW (ref 4.22–5.81)
RDW: 20.6 % — ABNORMAL HIGH (ref 11.5–15.5)
WBC: 6.1 K/uL (ref 4.0–10.5)
nRBC: 0 % (ref 0.0–0.2)

## 2023-11-17 LAB — COMPREHENSIVE METABOLIC PANEL WITH GFR
ALT: 17 U/L (ref 0–44)
AST: 18 U/L (ref 15–41)
Albumin: 1.7 g/dL — ABNORMAL LOW (ref 3.5–5.0)
Alkaline Phosphatase: 71 U/L (ref 38–126)
Anion gap: 9 (ref 5–15)
BUN: 69 mg/dL — ABNORMAL HIGH (ref 8–23)
CO2: 21 mmol/L — ABNORMAL LOW (ref 22–32)
Calcium: 8 mg/dL — ABNORMAL LOW (ref 8.9–10.3)
Chloride: 110 mmol/L (ref 98–111)
Creatinine, Ser: 0.94 mg/dL (ref 0.61–1.24)
GFR, Estimated: >60 mL/min (ref >60)
Glucose, Bld: 86 mg/dL (ref 70–99)
Potassium: 5.7 mmol/L — ABNORMAL HIGH (ref 3.5–5.1)
Sodium: 140 mmol/L (ref 135–145)
Total Bilirubin: 0.6 mg/dL (ref 0.0–1.2)
Total Protein: 6 g/dL — ABNORMAL LOW (ref 6.5–8.1)

## 2023-11-17 LAB — POTASSIUM: Potassium: 4.9 mmol/L (ref 3.5–5.1)

## 2023-11-17 LAB — GLUCOSE, CAPILLARY
Glucose-Capillary: 81 mg/dL (ref 70–99)
Glucose-Capillary: 82 mg/dL (ref 70–99)
Glucose-Capillary: 82 mg/dL (ref 70–99)
Glucose-Capillary: 25 mg/dL — CL (ref 70–99)
Glucose-Capillary: 124 mg/dL — ABNORMAL HIGH (ref 70–99)
Glucose-Capillary: 142 mg/dL — ABNORMAL HIGH (ref 70–99)
Glucose-Capillary: 148 mg/dL — ABNORMAL HIGH (ref 70–99)
Glucose-Capillary: 130 mg/dL — ABNORMAL HIGH (ref 70–99)

## 2023-11-17 LAB — CK: Total CK: 46 U/L — ABNORMAL LOW (ref 49–397)

## 2023-11-17 MED ORDER — SODIUM CHLORIDE 0.9 % IV BOLUS
500.0000 mL | Freq: Once | INTRAVENOUS | Status: AC
  Administered 2023-11-17: 500 mL via INTRAVENOUS

## 2023-11-17 MED ORDER — DEXTROSE 50 % IV SOLN
INTRAVENOUS | Status: AC
Start: 2023-11-17 — End: 2023-11-17
  Administered 2023-11-17: 50 mL via INTRAVENOUS
  Filled 2023-11-17: qty 50

## 2023-11-17 MED ORDER — FLUDROCORTISONE ACETATE 0.1 MG PO TABS
0.1000 mg | ORAL_TABLET | Freq: Two times a day (BID) | ORAL | Status: DC
  Administered 2023-11-17 – 2023-12-02 (×30): 0.1 mg
  Filled 2023-11-17 (×32): qty 1

## 2023-11-17 MED ORDER — DEXTROSE 50 % IV SOLN
1.0000 | Freq: Once | INTRAVENOUS | Status: AC
  Administered 2023-11-17: 50 mL via INTRAVENOUS
  Filled 2023-11-17: qty 50

## 2023-11-17 MED ORDER — NEPRO/CARBSTEADY PO LIQD
1000.0000 mL | ORAL | Status: DC
  Administered 2023-11-17 – 2023-11-18 (×2): 1000 mL
  Filled 2023-11-17 (×2): qty 1000

## 2023-11-17 MED ORDER — DEXTROSE 5 % IV SOLN: INTRAVENOUS | Status: AC

## 2023-11-17 MED ORDER — INSULIN ASPART 100 UNIT/ML IV SOLN
10.0000 [IU] | Freq: Once | INTRAVENOUS | Status: AC
  Administered 2023-11-17: 10 [IU] via INTRAVENOUS

## 2023-11-17 MED ORDER — SODIUM ZIRCONIUM CYCLOSILICATE 10 G PO PACK
10.0000 g | PACK | Freq: Three times a day (TID) | ORAL | Status: DC
  Administered 2023-11-17 (×2): 10 g
  Filled 2023-11-17 (×2): qty 1

## 2023-11-17 MED ORDER — SODIUM CHLORIDE 0.9 % IV SOLN
2.0000 g | Freq: Three times a day (TID) | INTRAVENOUS | Status: AC
  Administered 2023-11-17 – 2023-11-21 (×14): 2 g via INTRAVENOUS
  Filled 2023-11-17 (×14): qty 12.5

## 2023-11-17 MED ORDER — VANCOMYCIN HCL 1750 MG/350ML IV SOLN
1750.0000 mg | Freq: Once | INTRAVENOUS | Status: AC
  Administered 2023-11-17: 1750 mg via INTRAVENOUS
  Filled 2023-11-17 (×2): qty 350

## 2023-11-17 MED ORDER — VANCOMYCIN HCL 1500 MG/300ML IV SOLN
1500.0000 mg | INTRAVENOUS | Status: AC
  Administered 2023-11-18 – 2023-11-21 (×4): 1500 mg via INTRAVENOUS
  Filled 2023-11-17 (×5): qty 300

## 2023-11-17 MED ORDER — DEXTROSE 50 % IV SOLN: 50.0000 mL | INTRAVENOUS | Status: AC

## 2023-11-17 MED ORDER — ALBUTEROL SULFATE (2.5 MG/3ML) 0.083% IN NEBU
10.0000 mg | INHALATION_SOLUTION | Freq: Once | RESPIRATORY_TRACT | Status: AC
  Administered 2023-11-17: 10 mg via RESPIRATORY_TRACT
  Filled 2023-11-17: qty 12

## 2023-11-17 MED ORDER — SODIUM BICARBONATE 8.4 % IV SOLN
50.0000 meq | Freq: Once | INTRAVENOUS | Status: AC
  Administered 2023-11-17: 50 meq via INTRAVENOUS
  Filled 2023-11-17: qty 50

## 2023-11-17 MED ORDER — CALCIUM GLUCONATE-NACL 1-0.675 GM/50ML-% IV SOLN
1.0000 g | Freq: Once | INTRAVENOUS | Status: AC
  Administered 2023-11-17: 1000 mg via INTRAVENOUS
  Filled 2023-11-17: qty 50

## 2023-11-17 NOTE — Plan of Care (Signed)
  Problem: Coping: Goal: Ability to adjust to condition or change in health will improve Outcome: Progressing   Problem: Health Behavior/Discharge Planning: Goal: Ability to identify and utilize available resources and services will improve Outcome: Progressing   Problem: Nutritional: Goal: Maintenance of adequate nutrition will improve Outcome: Progressing

## 2023-11-17 NOTE — Plan of Care (Signed)
  Problem: Education: Goal: Ability to describe self-care measures that may prevent or decrease complications (Diabetes Survival Skills Education) will improve Outcome: Not Progressing Goal: Individualized Educational Video(s) Outcome: Not Progressing   Problem: Coping: Goal: Ability to adjust to condition or change in health will improve Outcome: Not Progressing   Problem: Fluid Volume: Goal: Ability to maintain a balanced intake and output will improve Outcome: Not Progressing   Problem: Metabolic: Goal: Ability to maintain appropriate glucose levels will improve Outcome: Not Progressing

## 2023-11-17 NOTE — Ethics Note (Signed)
 Consult received from Dr. Jonel regarding futility and the process for this patient (see H&P for full medical hx and current condition)  Out of facility DNR filed in chart.  Patient is currently a full code within this admission. Patients daughter is acting as advertising account executive. The patient does have a wife but the medical team has been unable to contact her for input.  Daughter will not allow contact of the wife per notes.  During this admission the patient has refused food.  A core track was placed for nutrition. His condition continues to decline medically.  Complicating this matter, the daughter, acting as healthcare agent, wants to continue full medical care and treatment including full code.  The Provider is seeking to apply the futility policy in this case.  Discussed that the policy can be followed as written. (See P&P in Sharepoint for full policy)  Palliative medicine is consulted on the case and is following.   I will follow up with palliative on Monday.  And provide further assistance as Ethics consult if needed.  Thank you,  Beverley Leeroy Sans, MSN, RN-BC, [facility], [facility]  [place] Ethics Committee Palliative Clinical Specialist

## 2023-11-17 NOTE — Progress Notes (Signed)
 Pharmacy Antibiotic Note  [name] [name] is a 78 y.o. male admitted on 10/23/2023 with sepsis.  Patient rpeviously completed acourse of abx, but is now afebrile again. Wbc 6.1 Pharmacy has been consulted for Vancomycin  and Zosyn  dosing. Due to previous zosyn  resistant infection will change zosyn  to cefepime  (d/w MD)  Plan: Vancomycin  1750mg  IV x 1 Vancomycin  1500m Iv q24h (AUC 469, Scr used 0.94) Cefepime  2gm Iv q8h Monitor renal function Monitor cultures and sensitivities for deescalation Vancomycin  levels if needed at steady state  Height: 6' (182.9 cm) Weight:  (Unable to get weight due to the bed.) IBW/kg (Calculated) : 77.6  Temp (24hrs), Avg:99.1 F (37.3 C), Min:98.1 F (36.7 C), Max:101.1 F (38.4 C)  Recent Labs  Lab 11/11/23 0609 11/12/23 0307 11/12/23 1341 11/13/23 0325 11/16/23 0304 11/16/23 0634 11/17/23 0344  WBC  --  9.1 9.7 8.5 10.0  --  6.1  CREATININE 0.53* 0.64  --   --   --  0.83 0.94    Estimated Creatinine Clearance: 64.1 mL/min (by C-G formula based on SCr of 0.94 mg/dL).    Allergies  Allergen Reactions   Baclofen Other (See Comments)    Left shoulder twitching/jerking   Lisinopril  Other (See Comments)    Hyperkalemia (a high level of the electrolyte potassium in the blood)    Antimicrobials this admission: Vanc 10/9 >>10/10, 11/2 >>> CFP 10/9 >> 10/12. 11/2 >> Flagyl  10/10 >>10/12 Dapto 10/10>>10/12 Dapto 10/14 >> 10/17 Merrem 10/14 >>10/17 Augmentin  + doxy 10/18 >> 10/23  Dose adjustments this admission: N/a  Microbiology results: 10/8 BCx: ng 10/8 UCx: 70K/cfu Enterobacter cloacae 10/10 L hip joint: no fungus isolated  [name] [name] [name] [name], PharmD, BCPS, FNKF Clinical Pharmacist [place] Please utilize Amion for appropriate phone number to reach the unit pharmacist [facility])  11/17/2023 1:03 PM

## 2023-11-17 NOTE — Progress Notes (Signed)
 2103 notification from lab of critical potassium level (6.7) Dr Andrez aware and has placed orders Charge nurse, Victoria, aware and assisted with carrying out orders.   Patient not talking but does respond by opening eyes, when touched or spoken to.

## 2023-11-17 NOTE — Clinical Note (Incomplete)
 Primary nurse and Dr.Danford informed this nurse about patient status throughout the shift. Patient became red mews towards end

## 2023-11-17 NOTE — Plan of Care (Signed)
  Problem: Health Behavior/Discharge Planning: Goal: Ability to identify and utilize available resources and services will improve Outcome: Progressing Goal: Ability to manage health-related needs will improve Outcome: Progressing   Problem: Metabolic: Goal: Ability to maintain appropriate glucose levels will improve Outcome: Progressing

## 2023-11-17 NOTE — Care Plan (Signed)
 Talked again to daughter, [name].  Updated on new hypoglycemia, new fever, initiation of antibiotics, improvement in potassium.  Updated on transfer to progressive care.  We discussed escalation of care and code status and she rejected any consideration for changing code status or transitioning to comfort care.  I asked for help in communicating with his wife, and she blocked that line of inquiry.
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
## 2023-11-18 DIAGNOSIS — Z515 Encounter for palliative care: Secondary | ICD-10-CM | POA: Diagnosis not present

## 2023-11-18 DIAGNOSIS — D638 Anemia in other chronic diseases classified elsewhere: Secondary | ICD-10-CM | POA: Diagnosis not present

## 2023-11-18 DIAGNOSIS — E43 Unspecified severe protein-calorie malnutrition: Secondary | ICD-10-CM | POA: Diagnosis not present

## 2023-11-18 DIAGNOSIS — R532 Functional quadriplegia: Secondary | ICD-10-CM | POA: Diagnosis not present

## 2023-11-18 DIAGNOSIS — M4628 Osteomyelitis of vertebra, sacral and sacrococcygeal region: Secondary | ICD-10-CM | POA: Diagnosis not present

## 2023-11-18 DIAGNOSIS — R627 Adult failure to thrive: Secondary | ICD-10-CM | POA: Diagnosis not present

## 2023-11-18 LAB — GLUCOSE, CAPILLARY
Glucose-Capillary: 109 mg/dL — ABNORMAL HIGH (ref 70–99)
Glucose-Capillary: 116 mg/dL — ABNORMAL HIGH (ref 70–99)
Glucose-Capillary: 121 mg/dL — ABNORMAL HIGH (ref 70–99)
Glucose-Capillary: 133 mg/dL — ABNORMAL HIGH (ref 70–99)
Glucose-Capillary: 146 mg/dL — ABNORMAL HIGH (ref 70–99)
Glucose-Capillary: 152 mg/dL — ABNORMAL HIGH (ref 70–99)
Glucose-Capillary: 167 mg/dL — ABNORMAL HIGH (ref 70–99)

## 2023-11-18 LAB — CBC
HCT: 23.5 % — ABNORMAL LOW (ref 39.0–52.0)
Hemoglobin: 7.1 g/dL — ABNORMAL LOW (ref 13.0–17.0)
MCH: 29.1 pg (ref 26.0–34.0)
MCHC: 30.2 g/dL (ref 30.0–36.0)
MCV: 96.3 fL (ref 80.0–100.0)
Platelets: 313 K/uL (ref 150–400)
RBC: 2.44 MIL/uL — ABNORMAL LOW (ref 4.22–5.81)
RDW: 20.6 % — ABNORMAL HIGH (ref 11.5–15.5)
WBC: 5.4 K/uL (ref 4.0–10.5)
nRBC: 0 % (ref 0.0–0.2)

## 2023-11-18 LAB — COMPREHENSIVE METABOLIC PANEL WITH GFR
ALT: 17 U/L (ref 0–44)
AST: 17 U/L (ref 15–41)
Albumin: 1.8 g/dL — ABNORMAL LOW (ref 3.5–5.0)
Alkaline Phosphatase: 84 U/L (ref 38–126)
Anion gap: 10 (ref 5–15)
BUN: 59 mg/dL — ABNORMAL HIGH (ref 8–23)
CO2: 20 mmol/L — ABNORMAL LOW (ref 22–32)
Calcium: 7.9 mg/dL — ABNORMAL LOW (ref 8.9–10.3)
Chloride: 106 mmol/L (ref 98–111)
Creatinine, Ser: 0.8 mg/dL (ref 0.61–1.24)
GFR, Estimated: >60 mL/min (ref >60)
Glucose, Bld: 163 mg/dL — ABNORMAL HIGH (ref 70–99)
Potassium: 4.5 mmol/L (ref 3.5–5.1)
Sodium: 136 mmol/L (ref 135–145)
Total Bilirubin: 0.6 mg/dL (ref 0.0–1.2)
Total Protein: 6.1 g/dL — ABNORMAL LOW (ref 6.5–8.1)

## 2023-11-18 LAB — MAGNESIUM: Magnesium: 1.5 mg/dL — ABNORMAL LOW (ref 1.7–2.4)

## 2023-11-18 LAB — PHOSPHORUS: Phosphorus: 4.1 mg/dL (ref 2.5–4.6)

## 2023-11-18 MED ORDER — INSULIN GLARGINE-YFGN 100 UNIT/ML ~~LOC~~ SOLN
18.0000 [IU] | Freq: Every day | SUBCUTANEOUS | Status: DC
  Administered 2023-11-19 – 2023-12-03 (×15): 18 [IU] via SUBCUTANEOUS
  Filled 2023-11-18 (×16): qty 0.18

## 2023-11-18 MED ORDER — MAGNESIUM SULFATE 2 GM/50ML IV SOLN
2.0000 g | Freq: Once | INTRAVENOUS | Status: AC
  Administered 2023-11-18: 2 g via INTRAVENOUS
  Filled 2023-11-18: qty 50
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
## 2023-11-18 NOTE — Telephone Encounter (Signed)
 Copied from CRM #[id number]. Topic: Clinical Concerns - Medical Question >> Nov 18, 2023  3:16 PM Wyline GRADE wrote: YUVONNE is calling other request    Include all details related to the request(s) below: Calling to let provider know that patient is at [facility] and will probably be admitted there for a while. Patient has been hospitalized for 3 weeks now due to stroke of the brain.     Confirm and type the Best Contact Number below:  Patient/caller contact number:   [phone]          [] Home  [x] Mobile  [] Work [] Other   [] Okay to leave a voicemail   Medication List:  Current Outpatient Medications:  .  aspirin  325 mg tablet, Take 325 mg by mouth Once Daily., Disp: , Rfl:  .  ergocalciferol (VITAMIN D2) 1,250 mcg (50,000 unit) capsule, Take 1 capsule (50,000 Units total) by mouth once a week., Disp: 12 capsule, Rfl: 1 .  ferrous sulfate  325 mg (65 mg iron ) EC tablet, Take 325 mg by mouth daily with breakfast., Disp: 90 tablet, Rfl: 1 .  fludrocortisone  (FLORINEF ) 0.1 mg tablet, Take 1 tablet (0.1 mg total) by mouth daily., Disp: 90 tablet, Rfl: 1 .  losartan  (COZAAR ) 50 mg tablet, Take 1 tablet (50 mg total) by mouth daily., Disp: 90 tablet, Rfl: 1 .  magnesium  oxide 400 mg (241 mg magnesium ) tab, Take 1 tablet (400 mg total) by mouth daily., Disp: 90 tablet, Rfl: 1 .  metFORMIN  (GLUCOPHAGE  XR) 750 mg 24 hr tablet, Take 1 tablet (750 mg total) by mouth 2 (two) times a day., Disp: 180 tablet, Rfl: 1 .  miscellaneous medical supply misc, Use Abb pads 5x9 req. 22m netapour soft cloth surgical tape Swing motion  sorbagun Ag dressing Pari woundcare triamcinalone as directed., Disp: 1 each, Rfl: 1 .  omeprazole  (PriLOSEC ) 20 mg DR capsule, TAKE 1 CAPSULE BY MOUTH EVERY DAY, Disp: 90 capsule, Rfl: 1 .  pravastatin  (PRAVACHOL ) 20 mg tablet, Take 1 tablet (20 mg total) by mouth daily., Disp: 90 tablet, Rfl: 1     Medication Request/Refills: Pharmacy Information (if  applicable)   [x] Not Applicable       []  Pharmacy listed  Send Medication Request to:                                                 [] Pharmacy not listed (added to pharmacy list in Epic) Send Medication Request to:      Listed Pharmacies: [id number] - [place], [place] - [address] - PHONE: [phone] - FAX: [phone]
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
## 2023-11-18 NOTE — TOC Progression Note (Signed)
 Transition of Care [facility]) - Progression Note    Patient Details  Name: [name] [name] MRN: [id number] Date of Birth: April 24, 1945  Transition of [facility]) CM/SW Contact  Inocente GORMAN Kindle, LCSW Phone Number: 11/18/2023, 9:21 AM  Clinical Narrative:    Patient transferred to 5W with [facility] and noted Ethics consult regarding care plan concerns. Will continue to follow.    Expected Discharge Plan: Home w Home Health Services Barriers to Discharge: Continued Medical Work up               Expected Discharge Plan and Services       Living arrangements for the past 2 months: Single Family Home                                       Social Drivers of Health (SDOH) Interventions SDOH Screenings   Food Insecurity: No Food Insecurity (10/24/2023)  Housing: Low Risk  (10/24/2023)  Transportation Needs: No Transportation Needs (10/24/2023)  Utilities: Not At Risk (10/24/2023)  Social Connections: Moderately Integrated (10/24/2023)  Tobacco Use: Medium Risk (10/24/2023)    Readmission Risk Interventions     No data to display

## 2023-11-19 ENCOUNTER — Inpatient Hospital Stay (HOSPITAL_COMMUNITY)

## 2023-11-19 DIAGNOSIS — R532 Functional quadriplegia: Secondary | ICD-10-CM | POA: Diagnosis not present

## 2023-11-19 DIAGNOSIS — E43 Unspecified severe protein-calorie malnutrition: Secondary | ICD-10-CM | POA: Diagnosis not present

## 2023-11-19 DIAGNOSIS — D638 Anemia in other chronic diseases classified elsewhere: Secondary | ICD-10-CM | POA: Diagnosis not present

## 2023-11-19 DIAGNOSIS — M4628 Osteomyelitis of vertebra, sacral and sacrococcygeal region: Secondary | ICD-10-CM | POA: Diagnosis not present

## 2023-11-19 LAB — COMPREHENSIVE METABOLIC PANEL WITH GFR
ALT: 18 U/L (ref 0–44)
AST: 18 U/L (ref 15–41)
Albumin: 1.8 g/dL — ABNORMAL LOW (ref 3.5–5.0)
Alkaline Phosphatase: 95 U/L (ref 38–126)
Anion gap: 11 (ref 5–15)
BUN: 60 mg/dL — ABNORMAL HIGH (ref 8–23)
CO2: 19 mmol/L — ABNORMAL LOW (ref 22–32)
Calcium: 8.1 mg/dL — ABNORMAL LOW (ref 8.9–10.3)
Chloride: 108 mmol/L (ref 98–111)
Creatinine, Ser: 0.78 mg/dL (ref 0.61–1.24)
GFR, Estimated: >60 mL/min (ref >60)
Glucose, Bld: 142 mg/dL — ABNORMAL HIGH (ref 70–99)
Potassium: 4 mmol/L (ref 3.5–5.1)
Sodium: 138 mmol/L (ref 135–145)
Total Bilirubin: 0.7 mg/dL (ref 0.0–1.2)
Total Protein: 6.3 g/dL — ABNORMAL LOW (ref 6.5–8.1)

## 2023-11-19 LAB — GLUCOSE, CAPILLARY
Glucose-Capillary: 100 mg/dL — ABNORMAL HIGH (ref 70–99)
Glucose-Capillary: 108 mg/dL — ABNORMAL HIGH (ref 70–99)
Glucose-Capillary: 109 mg/dL — ABNORMAL HIGH (ref 70–99)
Glucose-Capillary: 131 mg/dL — ABNORMAL HIGH (ref 70–99)
Glucose-Capillary: 136 mg/dL — ABNORMAL HIGH (ref 70–99)
Glucose-Capillary: 49 mg/dL — ABNORMAL LOW (ref 70–99)
Glucose-Capillary: 78 mg/dL (ref 70–99)

## 2023-11-19 LAB — CBC
HCT: 24.3 % — ABNORMAL LOW (ref 39.0–52.0)
Hemoglobin: 7.5 g/dL — ABNORMAL LOW (ref 13.0–17.0)
MCH: 29.3 pg (ref 26.0–34.0)
MCHC: 30.9 g/dL (ref 30.0–36.0)
MCV: 94.9 fL (ref 80.0–100.0)
Platelets: 339 K/uL (ref 150–400)
RBC: 2.56 MIL/uL — ABNORMAL LOW (ref 4.22–5.81)
RDW: 20.4 % — ABNORMAL HIGH (ref 11.5–15.5)
WBC: 5.8 K/uL (ref 4.0–10.5)
nRBC: 0 % (ref 0.0–0.2)

## 2023-11-19 LAB — PHOSPHORUS: Phosphorus: 3.4 mg/dL (ref 2.5–4.6)

## 2023-11-19 LAB — MAGNESIUM: Magnesium: 2 mg/dL (ref 1.7–2.4)

## 2023-11-19 MED ORDER — VALACYCLOVIR HCL 500 MG PO TABS
500.0000 mg | ORAL_TABLET | Freq: Three times a day (TID) | ORAL | Status: DC
  Filled 2023-11-19 (×2): qty 1

## 2023-11-19 MED ORDER — VALACYCLOVIR 50 MG/ML ORAL SUSPENSION
1000.0000 mg | Freq: Three times a day (TID) | ORAL | Status: DC
  Filled 2023-11-19: qty 20

## 2023-11-19 MED ORDER — NEPRO/CARBSTEADY PO LIQD
1000.0000 mL | ORAL | Status: DC
  Administered 2023-11-19 – 2023-11-22 (×2): 1000 mL
  Filled 2023-11-19 (×3): qty 1000

## 2023-11-19 MED ORDER — IOHEXOL 350 MG/ML SOLN
75.0000 mL | Freq: Once | INTRAVENOUS | Status: AC | PRN
  Administered 2023-11-19: 75 mL via INTRAVENOUS

## 2023-11-19 MED ORDER — ENSURE PLUS HIGH PROTEIN PO LIQD: 237.0000 mL | Freq: Every day | ORAL | Status: DC

## 2023-11-19 MED ORDER — DEXTROSE 50 % IV SOLN
25.0000 g | INTRAVENOUS | Status: AC
  Administered 2023-11-19: 25 g via INTRAVENOUS
  Filled 2023-11-19: qty 50

## 2023-11-19 MED ORDER — VALACYCLOVIR HCL 500 MG PO TABS
1000.0000 mg | ORAL_TABLET | Freq: Three times a day (TID) | ORAL | Status: AC
  Administered 2023-11-19 – 2023-11-26 (×21): 1000 mg
  Filled 2023-11-19 (×22): qty 2

## 2023-11-19 MED ORDER — PROSOURCE TF20 ENFIT COMPATIBL EN LIQD
60.0000 mL | Freq: Every day | ENTERAL | Status: DC
  Administered 2023-11-20 – 2023-11-23 (×4): 60 mL
  Filled 2023-11-19 (×4): qty 60
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
## 2023-11-19 NOTE — Plan of Care (Signed)
 SABRA

## 2023-11-19 NOTE — Plan of Care (Signed)
 Patient ID: [name] [name], male   DOB: March 14, 1945, 78 y.o.   MRN: [id number]    Progress Note from the Palliative Medicine Team at [facility]   Patient Name: [name] [name]        Date: 11/19/2023 DOB: 1945-02-22  Age: 78 y.o. MRN#: [id number] Attending Physician: Jonel Lonni SQUIBB, * Primary Care Physician: [name], MD Admit Date: 10/23/2023   In discussion with Dr Jonel, Palliative Medicine will sign off at this time.   Per his note  Family expressed unreasonable expectations of recovery, and advocate for continued treatment and the most progressive way possible.   Please re-consult if we can be of help in the future  No charge  [name] Plants NP  Palliative Medicine Team Team Phone # [phone] Pager [phone]

## 2023-11-20 DIAGNOSIS — E43 Unspecified severe protein-calorie malnutrition: Secondary | ICD-10-CM | POA: Diagnosis not present

## 2023-11-20 DIAGNOSIS — R627 Adult failure to thrive: Secondary | ICD-10-CM | POA: Diagnosis not present

## 2023-11-20 DIAGNOSIS — Z515 Encounter for palliative care: Secondary | ICD-10-CM | POA: Diagnosis not present

## 2023-11-20 DIAGNOSIS — R532 Functional quadriplegia: Secondary | ICD-10-CM | POA: Diagnosis not present

## 2023-11-20 LAB — COMPREHENSIVE METABOLIC PANEL WITH GFR
ALT: 16 U/L (ref 0–44)
AST: 17 U/L (ref 15–41)
Albumin: 1.8 g/dL — ABNORMAL LOW (ref 3.5–5.0)
Alkaline Phosphatase: 86 U/L (ref 38–126)
Anion gap: 11 (ref 5–15)
BUN: 56 mg/dL — ABNORMAL HIGH (ref 8–23)
CO2: 19 mmol/L — ABNORMAL LOW (ref 22–32)
Calcium: 8.3 mg/dL — ABNORMAL LOW (ref 8.9–10.3)
Chloride: 108 mmol/L (ref 98–111)
Creatinine, Ser: 0.72 mg/dL (ref 0.61–1.24)
GFR, Estimated: >60 mL/min (ref >60)
Glucose, Bld: 164 mg/dL — ABNORMAL HIGH (ref 70–99)
Potassium: 3.6 mmol/L (ref 3.5–5.1)
Sodium: 138 mmol/L (ref 135–145)
Total Bilirubin: 0.6 mg/dL (ref 0.0–1.2)
Total Protein: 6.5 g/dL (ref 6.5–8.1)

## 2023-11-20 LAB — GLUCOSE, CAPILLARY
Glucose-Capillary: 130 mg/dL — ABNORMAL HIGH (ref 70–99)
Glucose-Capillary: 133 mg/dL — ABNORMAL HIGH (ref 70–99)
Glucose-Capillary: 137 mg/dL — ABNORMAL HIGH (ref 70–99)
Glucose-Capillary: 161 mg/dL — ABNORMAL HIGH (ref 70–99)
Glucose-Capillary: 162 mg/dL — ABNORMAL HIGH (ref 70–99)
Glucose-Capillary: 169 mg/dL — ABNORMAL HIGH (ref 70–99)
Glucose-Capillary: 179 mg/dL — ABNORMAL HIGH (ref 70–99)
Glucose-Capillary: 201 mg/dL — ABNORMAL HIGH (ref 70–99)
Glucose-Capillary: 97 mg/dL (ref 70–99)

## 2023-11-20 LAB — CBC
HCT: 25.1 % — ABNORMAL LOW (ref 39.0–52.0)
Hemoglobin: 7.7 g/dL — ABNORMAL LOW (ref 13.0–17.0)
MCH: 29.2 pg (ref 26.0–34.0)
MCHC: 30.7 g/dL (ref 30.0–36.0)
MCV: 95.1 fL (ref 80.0–100.0)
Platelets: 350 K/uL (ref 150–400)
RBC: 2.64 MIL/uL — ABNORMAL LOW (ref 4.22–5.81)
RDW: 20.3 % — ABNORMAL HIGH (ref 11.5–15.5)
WBC: 5.1 K/uL (ref 4.0–10.5)
nRBC: 0 % (ref 0.0–0.2)

## 2023-11-20 LAB — MAGNESIUM: Magnesium: 2 mg/dL (ref 1.7–2.4)

## 2023-11-20 NOTE — Plan of Care (Signed)
  Problem: Education: Goal: Ability to describe self-care measures that may prevent or decrease complications (Diabetes Survival Skills Education) will improve Outcome: Progressing   Problem: Clinical Measurements: Goal: Will remain free from infection Outcome: Progressing   Problem: Nutrition: Goal: Adequate nutrition will be maintained Outcome: Progressing   Problem: Pain Managment: Goal: General experience of comfort will improve and/or be controlled Outcome: Progressing   Problem: Safety: Goal: Ability to remain free from injury will improve Outcome: Progressing   Problem: Skin Integrity: Goal: Risk for impaired skin integrity will decrease Outcome: Progressing   Problem: Ischemic Stroke/TIA Tissue Perfusion: Goal: Complications of ischemic stroke/TIA will be minimized Outcome: Progressing   Problem: Coping: Goal: Will verbalize positive feelings about self Outcome: Progressing   Problem: Nutrition: Goal: Dietary intake will improve Outcome: Progressing   Problem: Pain Management: Goal: Satisfaction with pain management regimen will improve Outcome: Progressing
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
## 2023-11-20 NOTE — Progress Notes (Signed)
 Hypoglycemic Event  CBG: 49  Treatment: D50 25 mL (12.5 gm)  Symptoms: Nervous/irritable  Follow-up CBG: Time:2353 CBG Result:100  Possible Reasons for Event: Inadequate meal intake - Tube feeding noted to be not infusing. Tube feeding was immediately restarted.  Comments/MD notified:  [name] PARAS., MD    [name] [name]

## 2023-11-20 NOTE — TOC Progression Note (Signed)
 Transition of Care [facility]) - Progression Note    Patient Details  Name: [name] [name] MRN: [id number] Date of Birth: [birth year]  Transition of Care [facility]) CM/SW Contact  Inocente GORMAN Kindle, LCSW Phone Number: 11/20/2023, 9:22 AM  Clinical Narrative:    Inpatient Care Management Continuing to follow for needs.    Expected Discharge Plan: Home w Home Health Services Barriers to Discharge: Continued Medical Work up               Expected Discharge Plan and Services       Living arrangements for the past 2 months: Single Family Home                                       Social Drivers of Health (SDOH) Interventions SDOH Screenings   Food Insecurity: No Food Insecurity (10/24/2023)  Housing: Low Risk  (10/24/2023)  Transportation Needs: No Transportation Needs (10/24/2023)  Utilities: Not At Risk (10/24/2023)  Social Connections: Moderately Integrated (10/24/2023)  Tobacco Use: Medium Risk (10/24/2023)    Readmission Risk Interventions     No data to display

## 2023-11-21 DIAGNOSIS — A419 Sepsis, unspecified organism: Secondary | ICD-10-CM | POA: Diagnosis not present

## 2023-11-21 LAB — COMPREHENSIVE METABOLIC PANEL WITH GFR
ALT: 16 U/L (ref 0–44)
AST: 18 U/L (ref 15–41)
Albumin: 1.8 g/dL — ABNORMAL LOW (ref 3.5–5.0)
Alkaline Phosphatase: 102 U/L (ref 38–126)
Anion gap: 11 (ref 5–15)
BUN: 68 mg/dL — ABNORMAL HIGH (ref 8–23)
CO2: 19 mmol/L — ABNORMAL LOW (ref 22–32)
Calcium: 8.4 mg/dL — ABNORMAL LOW (ref 8.9–10.3)
Chloride: 109 mmol/L (ref 98–111)
Creatinine, Ser: 0.74 mg/dL (ref 0.61–1.24)
GFR, Estimated: >60 mL/min (ref >60)
Glucose, Bld: 174 mg/dL — ABNORMAL HIGH (ref 70–99)
Potassium: 3.6 mmol/L (ref 3.5–5.1)
Sodium: 139 mmol/L (ref 135–145)
Total Bilirubin: 0.5 mg/dL (ref 0.0–1.2)
Total Protein: 5.8 g/dL — ABNORMAL LOW (ref 6.5–8.1)

## 2023-11-21 LAB — GLUCOSE, CAPILLARY
Glucose-Capillary: 180 mg/dL — ABNORMAL HIGH (ref 70–99)
Glucose-Capillary: 157 mg/dL — ABNORMAL HIGH (ref 70–99)
Glucose-Capillary: 187 mg/dL — ABNORMAL HIGH (ref 70–99)
Glucose-Capillary: 198 mg/dL — ABNORMAL HIGH (ref 70–99)
Glucose-Capillary: 197 mg/dL — ABNORMAL HIGH (ref 70–99)

## 2023-11-21 LAB — CBC
HCT: 25.4 % — ABNORMAL LOW (ref 39.0–52.0)
Hemoglobin: 7.8 g/dL — ABNORMAL LOW (ref 13.0–17.0)
MCH: 29.4 pg (ref 26.0–34.0)
MCHC: 30.7 g/dL (ref 30.0–36.0)
MCV: 95.8 fL (ref 80.0–100.0)
Platelets: 343 K/uL (ref 150–400)
RBC: 2.65 MIL/uL — ABNORMAL LOW (ref 4.22–5.81)
RDW: 20.6 % — ABNORMAL HIGH (ref 11.5–15.5)
WBC: 5.8 K/uL (ref 4.0–10.5)
nRBC: 0 % (ref 0.0–0.2)

## 2023-11-21 LAB — MAGNESIUM: Magnesium: 2 mg/dL (ref 1.7–2.4)
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
## 2023-11-21 NOTE — Plan of Care (Signed)
 Problem: Education: Goal: Ability to describe self-care measures that may prevent or decrease complications (Diabetes Survival Skills Education) will improve Outcome: Progressing Goal: Individualized Educational Video(s) Outcome: Progressing   Problem: Coping: Goal: Ability to adjust to condition or change in health will improve Outcome: Progressing   Problem: Fluid Volume: Goal: Ability to maintain a balanced intake and output will improve Outcome: Progressing   Problem: Health Behavior/Discharge Planning: Goal: Ability to identify and utilize available resources and services will improve Outcome: Progressing Goal: Ability to manage health-related needs will improve Outcome: Progressing   Problem: Metabolic: Goal: Ability to maintain appropriate glucose levels will improve Outcome: Progressing   Problem: Nutritional: Goal: Maintenance of adequate nutrition will improve Outcome: Progressing Goal: Progress toward achieving an optimal weight will improve Outcome: Progressing   Problem: Skin Integrity: Goal: Risk for impaired skin integrity will decrease Outcome: Progressing   Problem: Tissue Perfusion: Goal: Adequacy of tissue perfusion will improve Outcome: Progressing   Problem: Education: Goal: Knowledge of General Education information will improve Description: Including pain rating scale, medication(s)/side effects and non-pharmacologic comfort measures Outcome: Progressing   Problem: Health Behavior/Discharge Planning: Goal: Ability to manage health-related needs will improve Outcome: Progressing   Problem: Clinical Measurements: Goal: Ability to maintain clinical measurements within normal limits will improve Outcome: Progressing Goal: Will remain free from infection Outcome: Progressing Goal: Diagnostic test results will improve Outcome: Progressing Goal: Respiratory complications will improve Outcome: Progressing Goal: Cardiovascular complication will  be avoided Outcome: Progressing   Problem: Activity: Goal: Risk for activity intolerance will decrease Outcome: Progressing   Problem: Nutrition: Goal: Adequate nutrition will be maintained Outcome: Progressing   Problem: Coping: Goal: Level of anxiety will decrease Outcome: Progressing   Problem: Elimination: Goal: Will not experience complications related to bowel motility Outcome: Progressing Goal: Will not experience complications related to urinary retention Outcome: Progressing   Problem: Pain Managment: Goal: General experience of comfort will improve and/or be controlled Outcome: Progressing   Problem: Safety: Goal: Ability to remain free from injury will improve Outcome: Progressing   Problem: Skin Integrity: Goal: Risk for impaired skin integrity will decrease Outcome: Progressing   Problem: Education: Goal: Knowledge of disease or condition will improve Outcome: Progressing Goal: Knowledge of secondary prevention will improve (MUST DOCUMENT ALL) Outcome: Progressing Goal: Knowledge of patient specific risk factors will improve (DELETE if not current risk factor) Outcome: Progressing   Problem: Ischemic Stroke/TIA Tissue Perfusion: Goal: Complications of ischemic stroke/TIA will be minimized Outcome: Progressing   Problem: Coping: Goal: Will verbalize positive feelings about self Outcome: Progressing Goal: Will identify appropriate support needs Outcome: Progressing   Problem: Health Behavior/Discharge Planning: Goal: Ability to manage health-related needs will improve Outcome: Progressing Goal: Goals will be collaboratively established with patient/family Outcome: Progressing   Problem: Self-Care: Goal: Ability to participate in self-care as condition permits will improve Outcome: Progressing Goal: Verbalization of feelings and concerns over difficulty with self-care will improve Outcome: Progressing Goal: Ability to communicate needs  accurately will improve Outcome: Progressing   Problem: Nutrition: Goal: Risk of aspiration will decrease Outcome: Progressing Goal: Dietary intake will improve Outcome: Progressing   Problem: Education: Goal: Knowledge of the prescribed therapeutic regimen will improve Outcome: Progressing   Problem: Coping: Goal: Ability to identify and develop effective coping behavior will improve Outcome: Progressing   Problem: Clinical Measurements: Goal: Quality of life will improve Outcome: Progressing   Problem: Respiratory: Goal: Verbalizations of increased ease of respirations will increase Outcome: Progressing   Problem: Role Relationship: Goal: Family's ability to  cope with current situation will improve Outcome: Progressing Goal: Ability to verbalize concerns, feelings, and thoughts to partner or family member will improve Outcome: Progressing   Problem: Pain Management: Goal: Satisfaction with pain management regimen will improve Outcome: Progressing

## 2023-11-22 ENCOUNTER — Inpatient Hospital Stay (HOSPITAL_COMMUNITY)

## 2023-11-22 DIAGNOSIS — A419 Sepsis, unspecified organism: Secondary | ICD-10-CM | POA: Diagnosis not present

## 2023-11-22 HISTORY — PX: IR TUNNELED CENTRAL VENOUS CATH PLC W IMG: IMG1939

## 2023-11-22 LAB — GLUCOSE, CAPILLARY
Glucose-Capillary: 167 mg/dL — ABNORMAL HIGH (ref 70–99)
Glucose-Capillary: 181 mg/dL — ABNORMAL HIGH (ref 70–99)
Glucose-Capillary: 186 mg/dL — ABNORMAL HIGH (ref 70–99)
Glucose-Capillary: 189 mg/dL — ABNORMAL HIGH (ref 70–99)
Glucose-Capillary: 190 mg/dL — ABNORMAL HIGH (ref 70–99)
Glucose-Capillary: 191 mg/dL — ABNORMAL HIGH (ref 70–99)
Glucose-Capillary: 212 mg/dL — ABNORMAL HIGH (ref 70–99)

## 2023-11-22 LAB — COMPREHENSIVE METABOLIC PANEL WITH GFR
ALT: 17 U/L (ref 0–44)
AST: 18 U/L (ref 15–41)
Albumin: 1.8 g/dL — ABNORMAL LOW (ref 3.5–5.0)
Alkaline Phosphatase: 94 U/L (ref 38–126)
Anion gap: 12 (ref 5–15)
BUN: 83 mg/dL — ABNORMAL HIGH (ref 8–23)
CO2: 15 mmol/L — ABNORMAL LOW (ref 22–32)
Calcium: 8.4 mg/dL — ABNORMAL LOW (ref 8.9–10.3)
Chloride: 109 mmol/L (ref 98–111)
Creatinine, Ser: 0.92 mg/dL (ref 0.61–1.24)
GFR, Estimated: >60 mL/min (ref >60)
Glucose, Bld: 209 mg/dL — ABNORMAL HIGH (ref 70–99)
Potassium: 3.6 mmol/L (ref 3.5–5.1)
Sodium: 136 mmol/L (ref 135–145)
Total Bilirubin: 0.6 mg/dL (ref 0.0–1.2)
Total Protein: 6.5 g/dL (ref 6.5–8.1)

## 2023-11-22 LAB — CBC
HCT: 23.5 % — ABNORMAL LOW (ref 39.0–52.0)
Hemoglobin: 7.2 g/dL — ABNORMAL LOW (ref 13.0–17.0)
MCH: 29.1 pg (ref 26.0–34.0)
MCHC: 30.6 g/dL (ref 30.0–36.0)
MCV: 95.1 fL (ref 80.0–100.0)
Platelets: 350 K/uL (ref 150–400)
RBC: 2.47 MIL/uL — ABNORMAL LOW (ref 4.22–5.81)
RDW: 20.7 % — ABNORMAL HIGH (ref 11.5–15.5)
WBC: 7.3 K/uL (ref 4.0–10.5)
nRBC: 0.3 % — ABNORMAL HIGH (ref 0.0–0.2)

## 2023-11-22 LAB — CULTURE, BLOOD (ROUTINE X 2)
Culture: NO GROWTH
Culture: NO GROWTH

## 2023-11-22 LAB — MAGNESIUM: Magnesium: 2.2 mg/dL (ref 1.7–2.4)

## 2023-11-22 MED ORDER — MAGNESIUM SULFATE IN D5W 1-5 GM/100ML-% IV SOLN
1.0000 g | Freq: Once | INTRAVENOUS | Status: AC
  Administered 2023-11-22: 1 g via INTRAVENOUS
  Filled 2023-11-22: qty 100

## 2023-11-22 MED ORDER — GLUCERNA 1.5 CAL PO LIQD
1000.0000 mL | ORAL | Status: DC
  Administered 2023-11-22 – 2023-11-23 (×2): 1000 mL
  Filled 2023-11-22 (×3): qty 1000

## 2023-11-22 MED ORDER — CHLORHEXIDINE GLUCONATE 4 % EX SOLN
Freq: Once | CUTANEOUS | Status: AC
  Administered 2023-11-22: 1.5 mL via TOPICAL

## 2023-11-22 MED ORDER — CHLORHEXIDINE GLUCONATE 4 % EX SOLN
CUTANEOUS | Status: AC
  Filled 2023-11-22: qty 15

## 2023-11-22 MED ORDER — LIDOCAINE-EPINEPHRINE 1 %-1:100000 IJ SOLN
20.0000 mL | Freq: Once | INTRAMUSCULAR | Status: AC
  Administered 2023-11-22: 4 mL via INTRADERMAL

## 2023-11-22 MED ORDER — HEPARIN SODIUM (PORCINE) 5000 UNIT/ML IJ SOLN
5000.0000 [IU] | Freq: Three times a day (TID) | INTRAMUSCULAR | Status: DC
  Administered 2023-11-22 – 2023-12-03 (×33): 5000 [IU] via SUBCUTANEOUS
  Filled 2023-11-22 (×33): qty 1

## 2023-11-22 MED ORDER — LIDOCAINE-EPINEPHRINE 1 %-1:100000 IJ SOLN
INTRAMUSCULAR | Status: AC
  Filled 2023-11-22: qty 1

## 2023-11-22 NOTE — Plan of Care (Signed)
 Problem: Education: Goal: Ability to describe self-care measures that may prevent or decrease complications (Diabetes Survival Skills Education) will improve Outcome: Progressing Goal: Individualized Educational Video(s) Outcome: Progressing   Problem: Coping: Goal: Ability to adjust to condition or change in health will improve Outcome: Progressing   Problem: Fluid Volume: Goal: Ability to maintain a balanced intake and output will improve Outcome: Progressing   Problem: Health Behavior/Discharge Planning: Goal: Ability to identify and utilize available resources and services will improve Outcome: Progressing Goal: Ability to manage health-related needs will improve Outcome: Progressing   Problem: Metabolic: Goal: Ability to maintain appropriate glucose levels will improve Outcome: Progressing   Problem: Nutritional: Goal: Maintenance of adequate nutrition will improve Outcome: Progressing Goal: Progress toward achieving an optimal weight will improve Outcome: Progressing   Problem: Skin Integrity: Goal: Risk for impaired skin integrity will decrease Outcome: Progressing   Problem: Tissue Perfusion: Goal: Adequacy of tissue perfusion will improve Outcome: Progressing   Problem: Education: Goal: Knowledge of General Education information will improve Description: Including pain rating scale, medication(s)/side effects and non-pharmacologic comfort measures Outcome: Progressing   Problem: Health Behavior/Discharge Planning: Goal: Ability to manage health-related needs will improve Outcome: Progressing   Problem: Clinical Measurements: Goal: Ability to maintain clinical measurements within normal limits will improve Outcome: Progressing Goal: Will remain free from infection Outcome: Progressing Goal: Diagnostic test results will improve Outcome: Progressing Goal: Respiratory complications will improve Outcome: Progressing Goal: Cardiovascular complication will  be avoided Outcome: Progressing   Problem: Activity: Goal: Risk for activity intolerance will decrease Outcome: Progressing   Problem: Nutrition: Goal: Adequate nutrition will be maintained Outcome: Progressing   Problem: Coping: Goal: Level of anxiety will decrease Outcome: Progressing   Problem: Elimination: Goal: Will not experience complications related to bowel motility Outcome: Progressing Goal: Will not experience complications related to urinary retention Outcome: Progressing   Problem: Pain Managment: Goal: General experience of comfort will improve and/or be controlled Outcome: Progressing   Problem: Safety: Goal: Ability to remain free from injury will improve Outcome: Progressing   Problem: Skin Integrity: Goal: Risk for impaired skin integrity will decrease Outcome: Progressing   Problem: Education: Goal: Knowledge of disease or condition will improve Outcome: Progressing Goal: Knowledge of secondary prevention will improve (MUST DOCUMENT ALL) Outcome: Progressing Goal: Knowledge of patient specific risk factors will improve (DELETE if not current risk factor) Outcome: Progressing   Problem: Ischemic Stroke/TIA Tissue Perfusion: Goal: Complications of ischemic stroke/TIA will be minimized Outcome: Progressing   Problem: Coping: Goal: Will verbalize positive feelings about self Outcome: Progressing Goal: Will identify appropriate support needs Outcome: Progressing   Problem: Health Behavior/Discharge Planning: Goal: Ability to manage health-related needs will improve Outcome: Progressing Goal: Goals will be collaboratively established with patient/family Outcome: Progressing   Problem: Self-Care: Goal: Ability to participate in self-care as condition permits will improve Outcome: Progressing Goal: Verbalization of feelings and concerns over difficulty with self-care will improve Outcome: Progressing Goal: Ability to communicate needs  accurately will improve Outcome: Progressing   Problem: Nutrition: Goal: Risk of aspiration will decrease Outcome: Progressing Goal: Dietary intake will improve Outcome: Progressing   Problem: Education: Goal: Knowledge of the prescribed therapeutic regimen will improve Outcome: Progressing   Problem: Coping: Goal: Ability to identify and develop effective coping behavior will improve Outcome: Progressing   Problem: Clinical Measurements: Goal: Quality of life will improve Outcome: Progressing   Problem: Respiratory: Goal: Verbalizations of increased ease of respirations will increase Outcome: Progressing   Problem: Role Relationship: Goal: Family's ability to  cope with current situation will improve Outcome: Progressing Goal: Ability to verbalize concerns, feelings, and thoughts to partner or family member will improve Outcome: Progressing   Problem: Pain Management: Goal: Satisfaction with pain management regimen will improve Outcome: Progressing
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
## 2023-11-22 NOTE — Procedures (Signed)
 Interventional Radiology Procedure:   Indications: Malpositioned central venous catheter  Procedure: Exchange of tunneled central venous catheter  Findings: Right jugular tunneled central line tip was crossing the midline.  New 22 cm dual lumen Powerline placed over a wire, tip at SVC/RA junction.    Complications: None     EBL: Minimal  Plan:  Central venous catheter is ready to use.    Jesenia Spera R. Philip, MD  Pager: [phone]
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
## 2023-11-22 NOTE — TOC Progression Note (Signed)
 Transition of Care [facility]) - Progression Note    Patient Details  Name: [name] [name] MRN: [id number] Date of Birth: October 02, 1945  [facility] of [facility] CM/SW Contact  Inocente GORMAN Kindle, LCSW Phone Number: 11/22/2023, 9:34 AM  Clinical Narrative:    Inpatient Care Management Continuing to follow for needs.    Expected Discharge Plan: Home w Home Health Services Barriers to Discharge: Continued Medical Work up               Expected Discharge Plan and Services       Living arrangements for the past 2 months: Single Family Home                                       Social Drivers of Health (SDOH) Interventions SDOH Screenings   Food Insecurity: No Food Insecurity (10/24/2023)  Housing: Low Risk  (10/24/2023)  Transportation Needs: No Transportation Needs (10/24/2023)  Utilities: Not At Risk (10/24/2023)  Social Connections: Moderately Integrated (10/24/2023)  Tobacco Use: Medium Risk (10/24/2023)    Readmission Risk Interventions     No data to display

## 2023-11-23 ENCOUNTER — Inpatient Hospital Stay (HOSPITAL_COMMUNITY)

## 2023-11-23 DIAGNOSIS — L89154 Pressure ulcer of sacral region, stage 4: Secondary | ICD-10-CM | POA: Diagnosis not present

## 2023-11-23 DIAGNOSIS — A419 Sepsis, unspecified organism: Secondary | ICD-10-CM | POA: Diagnosis not present

## 2023-11-23 LAB — GLUCOSE, CAPILLARY
Glucose-Capillary: 170 mg/dL — ABNORMAL HIGH (ref 70–99)
Glucose-Capillary: 157 mg/dL — ABNORMAL HIGH (ref 70–99)
Glucose-Capillary: 169 mg/dL — ABNORMAL HIGH (ref 70–99)
Glucose-Capillary: 199 mg/dL — ABNORMAL HIGH (ref 70–99)
Glucose-Capillary: 181 mg/dL — ABNORMAL HIGH (ref 70–99)

## 2023-11-23 LAB — CBC WITH DIFFERENTIAL/PLATELET
Abs Immature Granulocytes: 0.08 K/uL — ABNORMAL HIGH (ref 0.00–0.07)
Basophils Absolute: 0.1 K/uL (ref 0.0–0.1)
Basophils Relative: 1 %
Eosinophils Absolute: 0.3 K/uL (ref 0.0–0.5)
Eosinophils Relative: 3 %
HCT: 26.1 % — ABNORMAL LOW (ref 39.0–52.0)
Hemoglobin: 8.2 g/dL — ABNORMAL LOW (ref 13.0–17.0)
Immature Granulocytes: 1 %
Lymphocytes Relative: 25 %
Lymphs Abs: 2.2 K/uL (ref 0.7–4.0)
MCH: 29.5 pg (ref 26.0–34.0)
MCHC: 31.4 g/dL (ref 30.0–36.0)
MCV: 93.9 fL (ref 80.0–100.0)
Monocytes Absolute: 1.1 K/uL — ABNORMAL HIGH (ref 0.1–1.0)
Monocytes Relative: 13 %
Neutro Abs: 4.9 K/uL (ref 1.7–7.7)
Neutrophils Relative %: 57 %
Platelets: 411 K/uL — ABNORMAL HIGH (ref 150–400)
RBC: 2.78 MIL/uL — ABNORMAL LOW (ref 4.22–5.81)
RDW: 20.4 % — ABNORMAL HIGH (ref 11.5–15.5)
WBC: 8.6 K/uL (ref 4.0–10.5)
nRBC: 0.2 % (ref 0.0–0.2)

## 2023-11-23 LAB — COMPREHENSIVE METABOLIC PANEL WITH GFR
ALT: 16 U/L (ref 0–44)
AST: 23 U/L (ref 15–41)
Albumin: 1.8 g/dL — ABNORMAL LOW (ref 3.5–5.0)
Alkaline Phosphatase: 109 U/L (ref 38–126)
Anion gap: 9 (ref 5–15)
BUN: 87 mg/dL — ABNORMAL HIGH (ref 8–23)
CO2: 18 mmol/L — ABNORMAL LOW (ref 22–32)
Calcium: 9 mg/dL (ref 8.9–10.3)
Chloride: 112 mmol/L — ABNORMAL HIGH (ref 98–111)
Creatinine, Ser: 0.83 mg/dL (ref 0.61–1.24)
GFR, Estimated: >60 mL/min (ref >60)
Glucose, Bld: 206 mg/dL — ABNORMAL HIGH (ref 70–99)
Potassium: 3.9 mmol/L (ref 3.5–5.1)
Sodium: 139 mmol/L (ref 135–145)
Total Bilirubin: 0.4 mg/dL (ref 0.0–1.2)
Total Protein: 6.9 g/dL (ref 6.5–8.1)

## 2023-11-23 LAB — MAGNESIUM: Magnesium: 2.4 mg/dL (ref 1.7–2.4)

## 2023-11-23 LAB — TYPE AND SCREEN
ABO/RH(D): O POS
Antibody Screen: NEGATIVE

## 2023-11-23 LAB — C-REACTIVE PROTEIN: CRP: 1.5 mg/dL — ABNORMAL HIGH (ref <1.0)

## 2023-11-23 LAB — PROCALCITONIN: Procalcitonin: 0.28 ng/mL

## 2023-11-23 NOTE — Plan of Care (Signed)
 Problem: Education: Goal: Ability to describe self-care measures that may prevent or decrease complications (Diabetes Survival Skills Education) will improve 11/23/2023 0142 by Elio Gustav PARAS, RN Outcome: Progressing 11/23/2023 0142 by Elio Gustav PARAS, RN Outcome: Progressing Goal: Individualized Educational Video(s) 11/23/2023 0142 by Elio Gustav PARAS, RN Outcome: Progressing 11/23/2023 0142 by Elio Gustav PARAS, RN Outcome: Progressing   Problem: Coping: Goal: Ability to adjust to condition or change in health will improve 11/23/2023 0142 by Elio Gustav PARAS, RN Outcome: Progressing 11/23/2023 0142 by Elio Gustav PARAS, RN Outcome: Progressing   Problem: Fluid Volume: Goal: Ability to maintain a balanced intake and output will improve 11/23/2023 0142 by Elio Gustav PARAS, RN Outcome: Progressing 11/23/2023 0142 by Elio Gustav PARAS, RN Outcome: Progressing   Problem: Health Behavior/Discharge Planning: Goal: Ability to identify and utilize available resources and services will improve 11/23/2023 0142 by Elio Gustav PARAS, RN Outcome: Progressing 11/23/2023 0142 by Elio Gustav PARAS, RN Outcome: Progressing Goal: Ability to manage health-related needs will improve 11/23/2023 0142 by Elio Gustav PARAS, RN Outcome: Progressing 11/23/2023 0142 by Elio Gustav PARAS, RN Outcome: Progressing   Problem: Metabolic: Goal: Ability to maintain appropriate glucose levels will improve 11/23/2023 0142 by Elio Gustav PARAS, RN Outcome: Progressing 11/23/2023 0142 by Elio Gustav PARAS, RN Outcome: Progressing   Problem: Nutritional: Goal: Maintenance of adequate nutrition will improve 11/23/2023 0142 by Elio Gustav PARAS, RN Outcome: Progressing 11/23/2023 0142 by Elio Gustav PARAS, RN Outcome: Progressing Goal: Progress toward achieving an optimal weight will improve 11/23/2023 0142 by Elio Gustav PARAS, RN Outcome: Progressing 11/23/2023 0142 by Elio Gustav PARAS, RN Outcome: Progressing   Problem:  Skin Integrity: Goal: Risk for impaired skin integrity will decrease 11/23/2023 0142 by Elio Gustav PARAS, RN Outcome: Progressing 11/23/2023 0142 by Elio Gustav PARAS, RN Outcome: Progressing   Problem: Tissue Perfusion: Goal: Adequacy of tissue perfusion will improve 11/23/2023 0142 by Elio Gustav PARAS, RN Outcome: Progressing 11/23/2023 0142 by Elio Gustav PARAS, RN Outcome: Progressing   Problem: Education: Goal: Knowledge of General Education information will improve Description: Including pain rating scale, medication(s)/side effects and non-pharmacologic comfort measures 11/23/2023 0142 by Elio Gustav PARAS, RN Outcome: Progressing 11/23/2023 0142 by Elio Gustav PARAS, RN Outcome: Progressing   Problem: Health Behavior/Discharge Planning: Goal: Ability to manage health-related needs will improve 11/23/2023 0142 by Elio Gustav PARAS, RN Outcome: Progressing 11/23/2023 0142 by Elio Gustav PARAS, RN Outcome: Progressing   Problem: Clinical Measurements: Goal: Ability to maintain clinical measurements within normal limits will improve 11/23/2023 0142 by Elio Gustav PARAS, RN Outcome: Progressing 11/23/2023 0142 by Elio Gustav PARAS, RN Outcome: Progressing Goal: Will remain free from infection 11/23/2023 0142 by Elio Gustav PARAS, RN Outcome: Progressing 11/23/2023 0142 by Elio Gustav PARAS, RN Outcome: Progressing Goal: Diagnostic test results will improve 11/23/2023 0142 by Elio Gustav PARAS, RN Outcome: Progressing 11/23/2023 0142 by Elio Gustav PARAS, RN Outcome: Progressing Goal: Respiratory complications will improve 11/23/2023 0142 by Elio Gustav PARAS, RN Outcome: Progressing 11/23/2023 0142 by Elio Gustav PARAS, RN Outcome: Progressing Goal: Cardiovascular complication will be avoided 11/23/2023 0142 by Elio Gustav PARAS, RN Outcome: Progressing 11/23/2023 0142 by Elio Gustav PARAS, RN Outcome: Progressing   Problem: Activity: Goal: Risk for activity intolerance will  decrease 11/23/2023 0142 by Elio Gustav PARAS, RN Outcome: Progressing 11/23/2023 0142 by Elio Gustav PARAS, RN Outcome: Progressing   Problem: Nutrition: Goal: Adequate nutrition will be maintained 11/23/2023 0142 by Elio Gustav PARAS, RN Outcome: Progressing 11/23/2023 0142 by Elio Gustav PARAS, RN Outcome: Progressing  Problem: Coping: Goal: Level of anxiety will decrease 11/23/2023 0142 by Elio Gustav PARAS, RN Outcome: Progressing 11/23/2023 0142 by Elio Gustav PARAS, RN Outcome: Progressing   Problem: Elimination: Goal: Will not experience complications related to bowel motility 11/23/2023 0142 by Elio Gustav PARAS, RN Outcome: Progressing 11/23/2023 0142 by Elio Gustav PARAS, RN Outcome: Progressing Goal: Will not experience complications related to urinary retention 11/23/2023 0142 by Elio Gustav PARAS, RN Outcome: Progressing 11/23/2023 0142 by Elio Gustav PARAS, RN Outcome: Progressing   Problem: Pain Managment: Goal: General experience of comfort will improve and/or be controlled 11/23/2023 0142 by Elio Gustav PARAS, RN Outcome: Progressing 11/23/2023 0142 by Elio Gustav PARAS, RN Outcome: Progressing   Problem: Safety: Goal: Ability to remain free from injury will improve 11/23/2023 0142 by Elio Gustav PARAS, RN Outcome: Progressing 11/23/2023 0142 by Elio Gustav PARAS, RN Outcome: Progressing   Problem: Skin Integrity: Goal: Risk for impaired skin integrity will decrease 11/23/2023 0142 by Elio Gustav PARAS, RN Outcome: Progressing 11/23/2023 0142 by Elio Gustav PARAS, RN Outcome: Progressing   Problem: Education: Goal: Knowledge of disease or condition will improve 11/23/2023 0142 by Elio Gustav PARAS, RN Outcome: Progressing 11/23/2023 0142 by Elio Gustav PARAS, RN Outcome: Progressing Goal: Knowledge of secondary prevention will improve (MUST DOCUMENT ALL) 11/23/2023 0142 by Elio Gustav PARAS, RN Outcome: Progressing 11/23/2023 0142 by Elio Gustav PARAS, RN Outcome:  Progressing Goal: Knowledge of patient specific risk factors will improve (DELETE if not current risk factor) 11/23/2023 0142 by Elio Gustav PARAS, RN Outcome: Progressing 11/23/2023 0142 by Elio Gustav PARAS, RN Outcome: Progressing   Problem: Ischemic Stroke/TIA Tissue Perfusion: Goal: Complications of ischemic stroke/TIA will be minimized 11/23/2023 0142 by Elio Gustav PARAS, RN Outcome: Progressing 11/23/2023 0142 by Elio Gustav PARAS, RN Outcome: Progressing   Problem: Coping: Goal: Will verbalize positive feelings about self 11/23/2023 0142 by Elio Gustav PARAS, RN Outcome: Progressing 11/23/2023 0142 by Elio Gustav PARAS, RN Outcome: Progressing Goal: Will identify appropriate support needs 11/23/2023 0142 by Elio Gustav PARAS, RN Outcome: Progressing 11/23/2023 0142 by Elio Gustav PARAS, RN Outcome: Progressing   Problem: Health Behavior/Discharge Planning: Goal: Ability to manage health-related needs will improve 11/23/2023 0142 by Elio Gustav PARAS, RN Outcome: Progressing 11/23/2023 0142 by Elio Gustav PARAS, RN Outcome: Progressing Goal: Goals will be collaboratively established with patient/family 11/23/2023 0142 by Elio Gustav PARAS, RN Outcome: Progressing 11/23/2023 0142 by Elio Gustav PARAS, RN Outcome: Progressing   Problem: Self-Care: Goal: Ability to participate in self-care as condition permits will improve 11/23/2023 0142 by Elio Gustav PARAS, RN Outcome: Progressing 11/23/2023 0142 by Elio Gustav PARAS, RN Outcome: Progressing Goal: Verbalization of feelings and concerns over difficulty with self-care will improve 11/23/2023 0142 by Elio Gustav PARAS, RN Outcome: Progressing 11/23/2023 0142 by Elio Gustav PARAS, RN Outcome: Progressing Goal: Ability to communicate needs accurately will improve 11/23/2023 0142 by Elio Gustav PARAS, RN Outcome: Progressing 11/23/2023 0142 by Elio Gustav PARAS, RN Outcome: Progressing   Problem: Nutrition: Goal: Risk of aspiration will  decrease 11/23/2023 0142 by Elio Gustav PARAS, RN Outcome: Progressing 11/23/2023 0142 by Elio Gustav PARAS, RN Outcome: Progressing Goal: Dietary intake will improve 11/23/2023 0142 by Elio Gustav PARAS, RN Outcome: Progressing 11/23/2023 0142 by Elio Gustav PARAS, RN Outcome: Progressing   Problem: Education: Goal: Knowledge of the prescribed therapeutic regimen will improve 11/23/2023 0142 by Elio Gustav PARAS, RN Outcome: Progressing 11/23/2023 0142 by Elio Gustav PARAS, RN Outcome: Progressing   Problem: Coping: Goal: Ability to identify and develop effective coping behavior  will improve 11/23/2023 0142 by Elio Gustav PARAS, RN Outcome: Progressing 11/23/2023 0142 by Elio Gustav PARAS, RN Outcome: Progressing   Problem: Clinical Measurements: Goal: Quality of life will improve 11/23/2023 0142 by Elio Gustav PARAS, RN Outcome: Progressing 11/23/2023 0142 by Elio Gustav PARAS, RN Outcome: Progressing   Problem: Respiratory: Goal: Verbalizations of increased ease of respirations will increase 11/23/2023 0142 by Elio Gustav PARAS, RN Outcome: Progressing 11/23/2023 0142 by Elio Gustav PARAS, RN Outcome: Progressing   Problem: Role Relationship: Goal: Family's ability to cope with current situation will improve 11/23/2023 0142 by Elio Gustav PARAS, RN Outcome: Progressing 11/23/2023 0142 by Elio Gustav PARAS, RN Outcome: Progressing Goal: Ability to verbalize concerns, feelings, and thoughts to partner or family member will improve 11/23/2023 0142 by Elio Gustav PARAS, RN Outcome: Progressing 11/23/2023 0142 by Elio Gustav PARAS, RN Outcome: Progressing   Problem: Pain Management: Goal: Satisfaction with pain management regimen will improve 11/23/2023 0142 by Elio Gustav PARAS, RN Outcome: Progressing 11/23/2023 0142 by Elio Gustav PARAS, RN Outcome: Progressing

## 2023-11-23 NOTE — Progress Notes (Signed)
 VAT consulted for bleeding CVC.  Dressing changed,site cleansed, secure port applied at insertion site. Care RN reccommended to contact IR if dressing becomes saturated overnight.
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
## 2023-11-23 NOTE — Consult Note (Addendum)
 WOC Nurse Consult Note: see original consult 10/24/2023; family requested WOC re-evaluate sacral wound; wounds have improved tremendously since admission (see media 10/8)  Reason for Consult: re-evaluate sacral wound  Wound type: 1.  Stage 4 Pressure Injury sacrum 80% meaty red 20% scattered tan fibrinous tissue  2.  Former unstageable Pressure Injury R buttock; now presenting as Stage 3 with 80% red meaty tissue 20% tan loose tissue  Pressure Injury POA: Yes Measurement: see nursing flowsheet  Wound bed: as above  Drainage (amount, consistency, odor) see nursing flowsheet  Periwound: pink scar tissue connecting two wounds  Dressing procedure/placement/frequency: Cleanse sacral and R buttock wounds with Vashe do not rinse and allow to air dry.  Apply silver  hydrofiber Soila [phone]) to wound beds daily.  Use a Q tip applicator to tuck silver  into areas of depth/undermining on sacral wound.  Top silver  with dry gauze and secure with silicone foam or ABD pad and tape whichever is preferred.   Patient should remain on a low air loss mattress for pressure redistribution and moisture management.  Patient is followed by RD for severe malnutrition with TF and supplementation to attempt to support wound healing.  See note 11/7.    POC discussed with bedside nurse.  WOC team will not follow. Re-consult if further  needs arise.   Thank you,    Powell Bar MSN, RN-BC, [employer]

## 2023-11-23 NOTE — Progress Notes (Signed)
 Wound care completed on sacrum/buttocks wound. Picture taken and documented in assessment as requested by [facility] team. New dressing and skin barrier placed. Dressing changed for picc line in place with IV team.

## 2023-11-24 ENCOUNTER — Inpatient Hospital Stay (HOSPITAL_COMMUNITY)

## 2023-11-24 DIAGNOSIS — A419 Sepsis, unspecified organism: Secondary | ICD-10-CM | POA: Diagnosis not present

## 2023-11-24 DIAGNOSIS — L89154 Pressure ulcer of sacral region, stage 4: Secondary | ICD-10-CM | POA: Diagnosis not present

## 2023-11-24 LAB — COMPREHENSIVE METABOLIC PANEL WITH GFR
ALT: 17 U/L (ref 0–44)
AST: 23 U/L (ref 15–41)
Albumin: 1.8 g/dL — ABNORMAL LOW (ref 3.5–5.0)
Alkaline Phosphatase: 124 U/L (ref 38–126)
Anion gap: 10 (ref 5–15)
BUN: 90 mg/dL — ABNORMAL HIGH (ref 8–23)
CO2: 20 mmol/L — ABNORMAL LOW (ref 22–32)
Calcium: 8.8 mg/dL — ABNORMAL LOW (ref 8.9–10.3)
Chloride: 108 mmol/L (ref 98–111)
Creatinine, Ser: 0.78 mg/dL (ref 0.61–1.24)
GFR, Estimated: >60 mL/min (ref >60)
Glucose, Bld: 165 mg/dL — ABNORMAL HIGH (ref 70–99)
Potassium: 4.2 mmol/L (ref 3.5–5.1)
Sodium: 138 mmol/L (ref 135–145)
Total Bilirubin: 0.4 mg/dL (ref 0.0–1.2)
Total Protein: 6.9 g/dL (ref 6.5–8.1)

## 2023-11-24 LAB — GLUCOSE, CAPILLARY
Glucose-Capillary: 127 mg/dL — ABNORMAL HIGH (ref 70–99)
Glucose-Capillary: 131 mg/dL — ABNORMAL HIGH (ref 70–99)
Glucose-Capillary: 146 mg/dL — ABNORMAL HIGH (ref 70–99)
Glucose-Capillary: 155 mg/dL — ABNORMAL HIGH (ref 70–99)
Glucose-Capillary: 156 mg/dL — ABNORMAL HIGH (ref 70–99)
Glucose-Capillary: 164 mg/dL — ABNORMAL HIGH (ref 70–99)
Glucose-Capillary: 181 mg/dL — ABNORMAL HIGH (ref 70–99)
Glucose-Capillary: 208 mg/dL — ABNORMAL HIGH (ref 70–99)

## 2023-11-24 LAB — CBC WITH DIFFERENTIAL/PLATELET
Abs Immature Granulocytes: 0.16 K/uL — ABNORMAL HIGH (ref 0.00–0.07)
Basophils Absolute: 0 K/uL (ref 0.0–0.1)
Basophils Relative: 0 %
Eosinophils Absolute: 0.2 K/uL (ref 0.0–0.5)
Eosinophils Relative: 3 %
HCT: 25.5 % — ABNORMAL LOW (ref 39.0–52.0)
Hemoglobin: 7.9 g/dL — ABNORMAL LOW (ref 13.0–17.0)
Immature Granulocytes: 2 %
Lymphocytes Relative: 30 %
Lymphs Abs: 3 K/uL (ref 0.7–4.0)
MCH: 29.4 pg (ref 26.0–34.0)
MCHC: 31 g/dL (ref 30.0–36.0)
MCV: 94.8 fL (ref 80.0–100.0)
Monocytes Absolute: 1.4 K/uL — ABNORMAL HIGH (ref 0.1–1.0)
Monocytes Relative: 14 %
Neutro Abs: 5 K/uL (ref 1.7–7.7)
Neutrophils Relative %: 51 %
Platelets: 394 K/uL (ref 150–400)
RBC: 2.69 MIL/uL — ABNORMAL LOW (ref 4.22–5.81)
RDW: 20.7 % — ABNORMAL HIGH (ref 11.5–15.5)
WBC: 9.7 K/uL (ref 4.0–10.5)
nRBC: 1.3 % — ABNORMAL HIGH (ref 0.0–0.2)

## 2023-11-24 LAB — C-REACTIVE PROTEIN: CRP: 2.9 mg/dL — ABNORMAL HIGH (ref <1.0)

## 2023-11-24 LAB — MAGNESIUM: Magnesium: 2.4 mg/dL (ref 1.7–2.4)

## 2023-11-24 LAB — PROCALCITONIN: Procalcitonin: 0.26 ng/mL

## 2023-11-24 MED ORDER — MAGNESIUM HYDROXIDE 400 MG/5ML PO SUSP
30.0000 mL | Freq: Every day | ORAL | Status: AC
  Administered 2023-11-24 – 2023-11-26 (×3): 30 mL
  Filled 2023-11-24 (×3): qty 30

## 2023-11-24 MED ORDER — GLUCERNA 1.5 CAL PO LIQD
1000.0000 mL | ORAL | Status: DC
  Administered 2023-11-24 – 2023-12-03 (×10): 1000 mL
  Filled 2023-11-24 (×10): qty 1000

## 2023-11-24 MED ORDER — LABETALOL HCL 5 MG/ML IV SOLN: 20.0000 mg | INTRAVENOUS | Status: DC | PRN

## 2023-11-24 MED ORDER — FUROSEMIDE 10 MG/ML IJ SOLN
40.0000 mg | Freq: Once | INTRAMUSCULAR | Status: AC
  Administered 2023-11-24: 40 mg via INTRAVENOUS
  Filled 2023-11-24: qty 4

## 2023-11-24 MED ORDER — GLUCERNA 1.5 CAL PO LIQD: 1000.0000 mL | ORAL | Status: DC

## 2023-11-24 MED ORDER — PROSOURCE TF20 ENFIT COMPATIBL EN LIQD
30.0000 mL | Freq: Every day | ENTERAL | Status: DC
  Administered 2023-11-24 – 2023-12-01 (×8): 30 mL
  Filled 2023-11-24 (×8): qty 60

## 2023-11-24 MED ORDER — BISACODYL 10 MG RE SUPP
10.0000 mg | Freq: Every day | RECTAL | Status: AC
  Administered 2023-11-24 – 2023-11-26 (×3): 10 mg via RECTAL
  Filled 2023-11-24 (×3): qty 1

## 2023-11-24 NOTE — Plan of Care (Signed)

## 2023-11-24 NOTE — Progress Notes (Signed)
 Pt. NT suctioned on right nair for moderate amount of white thick secretions, per MD order. Pt. Tolerated well, with minimal bleeding. Vital signs were maintained within normal limits throughout procedure.
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
## 2023-11-25 ENCOUNTER — Inpatient Hospital Stay (HOSPITAL_COMMUNITY)

## 2023-11-25 DIAGNOSIS — L89154 Pressure ulcer of sacral region, stage 4: Secondary | ICD-10-CM | POA: Diagnosis not present

## 2023-11-25 DIAGNOSIS — A419 Sepsis, unspecified organism: Secondary | ICD-10-CM | POA: Diagnosis not present

## 2023-11-25 LAB — CBC WITH DIFFERENTIAL/PLATELET
Abs Immature Granulocytes: 0.14 K/uL — ABNORMAL HIGH (ref 0.00–0.07)
Basophils Absolute: 0.1 K/uL (ref 0.0–0.1)
Basophils Relative: 0 %
Eosinophils Absolute: 0.2 K/uL (ref 0.0–0.5)
Eosinophils Relative: 2 %
HCT: 24.2 % — ABNORMAL LOW (ref 39.0–52.0)
Hemoglobin: 7.5 g/dL — ABNORMAL LOW (ref 13.0–17.0)
Immature Granulocytes: 1 %
Lymphocytes Relative: 23 %
Lymphs Abs: 2.7 K/uL (ref 0.7–4.0)
MCH: 29.1 pg (ref 26.0–34.0)
MCHC: 31 g/dL (ref 30.0–36.0)
MCV: 93.8 fL (ref 80.0–100.0)
Monocytes Absolute: 1.7 K/uL — ABNORMAL HIGH (ref 0.1–1.0)
Monocytes Relative: 14 %
Neutro Abs: 7.1 K/uL (ref 1.7–7.7)
Neutrophils Relative %: 60 %
Platelets: 396 K/uL (ref 150–400)
RBC: 2.58 MIL/uL — ABNORMAL LOW (ref 4.22–5.81)
RDW: 21 % — ABNORMAL HIGH (ref 11.5–15.5)
WBC: 11.8 K/uL — ABNORMAL HIGH (ref 4.0–10.5)
nRBC: 1.4 % — ABNORMAL HIGH (ref 0.0–0.2)

## 2023-11-25 LAB — COMPREHENSIVE METABOLIC PANEL WITH GFR
ALT: 16 U/L (ref 0–44)
AST: 20 U/L (ref 15–41)
Albumin: 1.8 g/dL — ABNORMAL LOW (ref 3.5–5.0)
Alkaline Phosphatase: 108 U/L (ref 38–126)
Anion gap: 8 (ref 5–15)
BUN: 101 mg/dL — ABNORMAL HIGH (ref 8–23)
CO2: 23 mmol/L (ref 22–32)
Calcium: 8.6 mg/dL — ABNORMAL LOW (ref 8.9–10.3)
Chloride: 111 mmol/L (ref 98–111)
Creatinine, Ser: 0.84 mg/dL (ref 0.61–1.24)
GFR, Estimated: >60 mL/min (ref >60)
Glucose, Bld: 126 mg/dL — ABNORMAL HIGH (ref 70–99)
Potassium: 4.5 mmol/L (ref 3.5–5.1)
Sodium: 142 mmol/L (ref 135–145)
Total Bilirubin: 0.4 mg/dL (ref 0.0–1.2)
Total Protein: 6.7 g/dL (ref 6.5–8.1)

## 2023-11-25 LAB — PROCALCITONIN: Procalcitonin: 0.26 ng/mL

## 2023-11-25 LAB — GLUCOSE, CAPILLARY
Glucose-Capillary: 127 mg/dL — ABNORMAL HIGH (ref 70–99)
Glucose-Capillary: 146 mg/dL — ABNORMAL HIGH (ref 70–99)
Glucose-Capillary: 171 mg/dL — ABNORMAL HIGH (ref 70–99)
Glucose-Capillary: 155 mg/dL — ABNORMAL HIGH (ref 70–99)
Glucose-Capillary: 182 mg/dL — ABNORMAL HIGH (ref 70–99)

## 2023-11-25 LAB — C-REACTIVE PROTEIN: CRP: 3.9 mg/dL — ABNORMAL HIGH (ref <1.0)

## 2023-11-25 LAB — MAGNESIUM: Magnesium: 2.4 mg/dL (ref 1.7–2.4)

## 2023-11-25 MED ORDER — MIDODRINE HCL 5 MG PO TABS: 10.0000 mg | ORAL_TABLET | Freq: Three times a day (TID) | ORAL | Status: DC

## 2023-11-25 MED ORDER — SODIUM CHLORIDE 0.9 % IV SOLN
3.0000 g | Freq: Four times a day (QID) | INTRAVENOUS | Status: AC
  Administered 2023-11-25 – 2023-11-30 (×20): 3 g via INTRAVENOUS
  Filled 2023-11-25 (×20): qty 8

## 2023-11-25 MED ORDER — MIDODRINE HCL 5 MG PO TABS
10.0000 mg | ORAL_TABLET | Freq: Three times a day (TID) | ORAL | Status: DC
  Administered 2023-11-25 – 2023-11-26 (×3): 10 mg
  Filled 2023-11-25 (×3): qty 2

## 2023-11-25 MED ORDER — LACTATED RINGERS IV BOLUS
1000.0000 mL | Freq: Once | INTRAVENOUS | Status: AC
  Administered 2023-11-25: 1000 mL via INTRAVENOUS

## 2023-11-25 NOTE — Plan of Care (Signed)
 T&P Q2; BM x3, wound care as ordered with sacral wound granulating well. No acute changes changes noted or reported.  Problem: Education: Goal: Ability to describe self-care measures that may prevent or decrease complications (Diabetes Survival Skills Education) will improve 11/25/2023 0703 by Elnor Zachary CROME, RN Outcome: Progressing 11/25/2023 0643 by Elnor Zachary CROME, RN Outcome: Progressing Goal: Individualized Educational Video(s) 11/25/2023 0703 by Elnor Zachary CROME, RN Outcome: Progressing 11/25/2023 0643 by Elnor Zachary CROME, RN Outcome: Progressing   Problem: Coping: Goal: Ability to adjust to condition or change in health will improve 11/25/2023 0703 by Elnor Zachary CROME, RN Outcome: Progressing 11/25/2023 0643 by Elnor Zachary CROME, RN Outcome: Progressing   Problem: Fluid Volume: Goal: Ability to maintain a balanced intake and output will improve 11/25/2023 0703 by Elnor Zachary CROME, RN Outcome: Progressing 11/25/2023 0643 by Elnor Zachary CROME, RN Outcome: Progressing   Problem: Health Behavior/Discharge Planning: Goal: Ability to identify and utilize available resources and services will improve 11/25/2023 0703 by Elnor Zachary CROME, RN Outcome: Progressing 11/25/2023 0643 by Elnor Zachary CROME, RN Outcome: Progressing Goal: Ability to manage health-related needs will improve 11/25/2023 0703 by Elnor Zachary CROME, RN Outcome: Progressing 11/25/2023 0643 by Elnor Zachary CROME, RN Outcome: Progressing   Problem: Metabolic: Goal: Ability to maintain appropriate glucose levels will improve 11/25/2023 0703 by Elnor Zachary CROME, RN Outcome: Progressing 11/25/2023 0643 by Elnor Zachary CROME, RN Outcome: Progressing   Problem: Nutritional: Goal: Maintenance of adequate nutrition will improve 11/25/2023 0703 by Elnor Zachary CROME, RN Outcome: Progressing 11/25/2023 0643 by Elnor Zachary CROME, RN Outcome: Progressing Goal: Progress toward achieving an optimal weight will improve 11/25/2023 0703 by Elnor Zachary CROME,  RN Outcome: Progressing 11/25/2023 0643 by Elnor Zachary CROME, RN Outcome: Progressing   Problem: Skin Integrity: Goal: Risk for impaired skin integrity will decrease 11/25/2023 0703 by Elnor Zachary CROME, RN Outcome: Progressing 11/25/2023 0643 by Elnor Zachary CROME, RN Outcome: Progressing   Problem: Tissue Perfusion: Goal: Adequacy of tissue perfusion will improve 11/25/2023 0703 by Elnor Zachary CROME, RN Outcome: Progressing 11/25/2023 0643 by Elnor Zachary CROME, RN Outcome: Progressing   Problem: Education: Goal: Knowledge of General Education information will improve Description: Including pain rating scale, medication(s)/side effects and non-pharmacologic comfort measures 11/25/2023 0703 by Elnor Zachary CROME, RN Outcome: Progressing 11/25/2023 0643 by Elnor Zachary CROME, RN Outcome: Progressing   Problem: Health Behavior/Discharge Planning: Goal: Ability to manage health-related needs will improve 11/25/2023 0703 by Elnor Zachary CROME, RN Outcome: Progressing 11/25/2023 0643 by Elnor Zachary CROME, RN Outcome: Progressing   Problem: Clinical Measurements: Goal: Ability to maintain clinical measurements within normal limits will improve 11/25/2023 0703 by Elnor Zachary CROME, RN Outcome: Progressing 11/25/2023 0643 by Elnor Zachary CROME, RN Outcome: Progressing Goal: Will remain free from infection 11/25/2023 0703 by Elnor Zachary CROME, RN Outcome: Progressing 11/25/2023 0643 by Elnor Zachary CROME, RN Outcome: Progressing Goal: Diagnostic test results will improve 11/25/2023 0703 by Elnor Zachary CROME, RN Outcome: Progressing 11/25/2023 0643 by Elnor Zachary CROME, RN Outcome: Progressing Goal: Respiratory complications will improve 11/25/2023 0703 by Elnor Zachary CROME, RN Outcome: Progressing 11/25/2023 0643 by Elnor Zachary CROME, RN Outcome: Progressing Goal: Cardiovascular complication will be avoided 11/25/2023 0703 by Elnor Zachary CROME, RN Outcome: Progressing 11/25/2023 0643 by Elnor Zachary CROME, RN Outcome:  Progressing   Problem: Activity: Goal: Risk for activity intolerance will decrease 11/25/2023 0703 by Elnor Zachary CROME, RN Outcome: Progressing 11/25/2023 0643 by Elnor Zachary CROME, RN Outcome: Progressing   Problem: Nutrition: Goal: Adequate nutrition will  be maintained 11/25/2023 0703 by Elnor Zachary CROME, RN Outcome: Progressing 11/25/2023 0643 by Elnor Zachary CROME, RN Outcome: Progressing   Problem: Coping: Goal: Level of anxiety will decrease 11/25/2023 0703 by Elnor Zachary CROME, RN Outcome: Progressing 11/25/2023 0643 by Elnor Zachary CROME, RN Outcome: Progressing   Problem: Elimination: Goal: Will not experience complications related to bowel motility 11/25/2023 0703 by Elnor Zachary CROME, RN Outcome: Progressing 11/25/2023 0643 by Elnor Zachary CROME, RN Outcome: Progressing Goal: Will not experience complications related to urinary retention 11/25/2023 0703 by Elnor Zachary CROME, RN Outcome: Progressing 11/25/2023 0643 by Elnor Zachary CROME, RN Outcome: Progressing   Problem: Pain Managment: Goal: General experience of comfort will improve and/or be controlled 11/25/2023 0703 by Elnor Zachary CROME, RN Outcome: Progressing 11/25/2023 0643 by Elnor Zachary CROME, RN Outcome: Progressing   Problem: Safety: Goal: Ability to remain free from injury will improve 11/25/2023 0703 by Elnor Zachary CROME, RN Outcome: Progressing 11/25/2023 0643 by Elnor Zachary CROME, RN Outcome: Progressing   Problem: Skin Integrity: Goal: Risk for impaired skin integrity will decrease 11/25/2023 0703 by Elnor Zachary CROME, RN Outcome: Progressing 11/25/2023 0643 by Elnor Zachary CROME, RN Outcome: Progressing   Problem: Education: Goal: Knowledge of disease or condition will improve 11/25/2023 0703 by Elnor Zachary CROME, RN Outcome: Progressing 11/25/2023 0643 by Elnor Zachary CROME, RN Outcome: Progressing Goal: Knowledge of secondary prevention will improve (MUST DOCUMENT ALL) 11/25/2023 0703 by Elnor Zachary CROME, RN Outcome:  Progressing 11/25/2023 0643 by Elnor Zachary CROME, RN Outcome: Progressing Goal: Knowledge of patient specific risk factors will improve (DELETE if not current risk factor) 11/25/2023 0703 by Elnor Zachary CROME, RN Outcome: Progressing 11/25/2023 0643 by Elnor Zachary CROME, RN Outcome: Progressing   Problem: Ischemic Stroke/TIA Tissue Perfusion: Goal: Complications of ischemic stroke/TIA will be minimized 11/25/2023 0703 by Elnor Zachary CROME, RN Outcome: Progressing 11/25/2023 0643 by Elnor Zachary CROME, RN Outcome: Progressing   Problem: Coping: Goal: Will verbalize positive feelings about self 11/25/2023 0703 by Elnor Zachary CROME, RN Outcome: Progressing 11/25/2023 0643 by Elnor Zachary CROME, RN Outcome: Progressing Goal: Will identify appropriate support needs 11/25/2023 0703 by Elnor Zachary CROME, RN Outcome: Progressing 11/25/2023 0643 by Elnor Zachary CROME, RN Outcome: Progressing   Problem: Health Behavior/Discharge Planning: Goal: Ability to manage health-related needs will improve 11/25/2023 0703 by Elnor Zachary CROME, RN Outcome: Progressing 11/25/2023 0643 by Elnor Zachary CROME, RN Outcome: Progressing Goal: Goals will be collaboratively established with patient/family 11/25/2023 0703 by Elnor Zachary CROME, RN Outcome: Progressing 11/25/2023 0643 by Elnor Zachary CROME, RN Outcome: Progressing   Problem: Self-Care: Goal: Ability to participate in self-care as condition permits will improve 11/25/2023 0703 by Elnor Zachary CROME, RN Outcome: Progressing 11/25/2023 0643 by Elnor Zachary CROME, RN Outcome: Progressing Goal: Verbalization of feelings and concerns over difficulty with self-care will improve 11/25/2023 0703 by Elnor Zachary CROME, RN Outcome: Progressing 11/25/2023 0643 by Elnor Zachary CROME, RN Outcome: Progressing Goal: Ability to communicate needs accurately will improve 11/25/2023 0703 by Elnor Zachary CROME, RN Outcome: Progressing 11/25/2023 0643 by Elnor Zachary CROME, RN Outcome: Progressing   Problem:  Nutrition: Goal: Risk of aspiration will decrease 11/25/2023 0703 by Elnor Zachary CROME, RN Outcome: Progressing 11/25/2023 0643 by Elnor Zachary CROME, RN Outcome: Progressing Goal: Dietary intake will improve 11/25/2023 0703 by Elnor Zachary CROME, RN Outcome: Progressing 11/25/2023 0643 by Elnor Zachary CROME, RN Outcome: Progressing   Problem: Education: Goal: Knowledge of the prescribed therapeutic regimen will improve 11/25/2023 0703 by Elnor Zachary CROME, RN Outcome: Progressing  11/25/2023 0643 by Elnor Zachary CROME, RN Outcome: Progressing   Problem: Coping: Goal: Ability to identify and develop effective coping behavior will improve 11/25/2023 0703 by Elnor Zachary CROME, RN Outcome: Progressing 11/25/2023 0643 by Elnor Zachary CROME, RN Outcome: Progressing   Problem: Clinical Measurements: Goal: Quality of life will improve 11/25/2023 0703 by Elnor Zachary CROME, RN Outcome: Progressing 11/25/2023 0643 by Elnor Zachary CROME, RN Outcome: Progressing   Problem: Respiratory: Goal: Verbalizations of increased ease of respirations will increase 11/25/2023 0703 by Elnor Zachary CROME, RN Outcome: Progressing 11/25/2023 0643 by Elnor Zachary CROME, RN Outcome: Progressing   Problem: Role Relationship: Goal: Family's ability to cope with current situation will improve 11/25/2023 0703 by Elnor Zachary CROME, RN Outcome: Progressing 11/25/2023 0643 by Elnor Zachary CROME, RN Outcome: Progressing Goal: Ability to verbalize concerns, feelings, and thoughts to partner or family member will improve 11/25/2023 0703 by Elnor Zachary CROME, RN Outcome: Progressing 11/25/2023 0643 by Elnor Zachary CROME, RN Outcome: Progressing   Problem: Pain Management: Goal: Satisfaction with pain management regimen will improve 11/25/2023 0703 by Elnor Zachary CROME, RN Outcome: Progressing 11/25/2023 0643 by Elnor Zachary CROME, RN Outcome: Progressing
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
## 2023-11-25 NOTE — TOC Progression Note (Signed)
 Transition of Care [facility]) - Progression Note    Patient Details  Name: [name] [name] MRN: [id number] Date of Birth: [birth year]  Transition of Care [facility]) CM/SW Contact  Inocente GORMAN Kindle, LCSW Phone Number: 11/25/2023, 8:28 AM  Clinical Narrative:    Inpatient Care Management continuing to follow.    Expected Discharge Plan: Home w Home Health Services Barriers to Discharge: Continued Medical Work up               Expected Discharge Plan and Services       Living arrangements for the past 2 months: Single Family Home                                       Social Drivers of Health (SDOH) Interventions SDOH Screenings   Food Insecurity: No Food Insecurity (10/24/2023)  Housing: Low Risk  (10/24/2023)  Transportation Needs: No Transportation Needs (10/24/2023)  Utilities: Not At Risk (10/24/2023)  Social Connections: Moderately Integrated (10/24/2023)  Tobacco Use: Medium Risk (10/24/2023)    Readmission Risk Interventions     No data to display

## 2023-11-25 NOTE — Plan of Care (Signed)
 Problem: Fluid Volume: Goal: Ability to maintain a balanced intake and output will improve Outcome: Progressing   Problem: Education: Goal: Ability to describe self-care measures that may prevent or decrease complications (Diabetes Survival Skills Education) will improve Outcome: Progressing Goal: Individualized Educational Video(s) Outcome: Progressing   Problem: Coping: Goal: Ability to adjust to condition or change in health will improve Outcome: Progressing   Problem: Fluid Volume: Goal: Ability to maintain a balanced intake and output will improve Outcome: Progressing   Problem: Health Behavior/Discharge Planning: Goal: Ability to identify and utilize available resources and services will improve Outcome: Progressing Goal: Ability to manage health-related needs will improve Outcome: Progressing   Problem: Metabolic: Goal: Ability to maintain appropriate glucose levels will improve Outcome: Progressing   Problem: Nutritional: Goal: Maintenance of adequate nutrition will improve Outcome: Progressing Goal: Progress toward achieving an optimal weight will improve Outcome: Progressing   Problem: Skin Integrity: Goal: Risk for impaired skin integrity will decrease Outcome: Progressing   Problem: Tissue Perfusion: Goal: Adequacy of tissue perfusion will improve Outcome: Progressing   Problem: Education: Goal: Knowledge of General Education information will improve Description: Including pain rating scale, medication(s)/side effects and non-pharmacologic comfort measures Outcome: Progressing   Problem: Health Behavior/Discharge Planning: Goal: Ability to manage health-related needs will improve Outcome: Progressing   Problem: Clinical Measurements: Goal: Ability to maintain clinical measurements within normal limits will improve Outcome: Progressing Goal: Will remain free from infection Outcome: Progressing Goal: Diagnostic test results will improve Outcome:  Progressing Goal: Respiratory complications will improve Outcome: Progressing Goal: Cardiovascular complication will be avoided Outcome: Progressing   Problem: Activity: Goal: Risk for activity intolerance will decrease Outcome: Progressing   Problem: Nutrition: Goal: Adequate nutrition will be maintained Outcome: Progressing   Problem: Coping: Goal: Level of anxiety will decrease Outcome: Progressing   Problem: Elimination: Goal: Will not experience complications related to bowel motility Outcome: Progressing Goal: Will not experience complications related to urinary retention Outcome: Progressing   Problem: Pain Managment: Goal: General experience of comfort will improve and/or be controlled Outcome: Progressing   Problem: Safety: Goal: Ability to remain free from injury will improve Outcome: Progressing   Problem: Skin Integrity: Goal: Risk for impaired skin integrity will decrease Outcome: Progressing   Problem: Education: Goal: Knowledge of disease or condition will improve Outcome: Progressing Goal: Knowledge of secondary prevention will improve (MUST DOCUMENT ALL) Outcome: Progressing Goal: Knowledge of patient specific risk factors will improve (DELETE if not current risk factor) Outcome: Progressing   Problem: Ischemic Stroke/TIA Tissue Perfusion: Goal: Complications of ischemic stroke/TIA will be minimized Outcome: Progressing   Problem: Coping: Goal: Will verbalize positive feelings about self Outcome: Progressing Goal: Will identify appropriate support needs Outcome: Progressing   Problem: Health Behavior/Discharge Planning: Goal: Ability to manage health-related needs will improve Outcome: Progressing Goal: Goals will be collaboratively established with patient/family Outcome: Progressing   Problem: Self-Care: Goal: Ability to participate in self-care as condition permits will improve Outcome: Progressing Goal: Verbalization of feelings and  concerns over difficulty with self-care will improve Outcome: Progressing Goal: Ability to communicate needs accurately will improve Outcome: Progressing   Problem: Nutrition: Goal: Risk of aspiration will decrease Outcome: Progressing Goal: Dietary intake will improve Outcome: Progressing   Problem: Education: Goal: Knowledge of the prescribed therapeutic regimen will improve Outcome: Progressing   Problem: Coping: Goal: Ability to identify and develop effective coping behavior will improve Outcome: Progressing   Problem: Clinical Measurements: Goal: Quality of life will improve Outcome: Progressing   Problem: Respiratory: Goal: Verbalizations  of increased ease of respirations will increase Outcome: Progressing   Problem: Role Relationship: Goal: Family's ability to cope with current situation will improve Outcome: Progressing Goal: Ability to verbalize concerns, feelings, and thoughts to partner or family member will improve Outcome: Progressing   Problem: Pain Management: Goal: Satisfaction with pain management regimen will improve Outcome: Progressing

## 2023-11-25 NOTE — Consult Note (Signed)
 WOC Nurse wound follow up Ongoing wound care in the setting of functional quadriplegia, right mid arm amputation, chronic lower extremity contractures, oral tardive dyskinesia, multiple sacral decubitus ulcers present on admission, failure to thrive, poor quality of life and long-term prognosis, severe PCM.   Palliative care had seen the patient, previously was made full comfort care there after family switched to full code and aggressive care.  Prognosis is poor, continue to monitor with present line of care.  Wound type: Stage 4 pressure injury to sacrum  Deep tissue injury to left foot L ischial tuberosity  Right hand amputation, intact Left hand abrasions Bilateral lower legs with chronic skin changes Measurement: Sacrum:  8 cm x 11 cm x 2 cm with undermining from 12 to 8 o'clock.  History osteomyelitis Left ischium:  4 cm  x 1.5 cm x 0.3 cm Wound azi:djrmjo and ischial wound with 20% adherent fibrin/slough to wound bed.  Both with significant improvement of devitalized tissue.  No changes in measurements.  Drainage (amount, consistency, odor) moderate serosanguinous.   Periwound: Scarring from previous ulcerations.  Contractures to lower legs Dressing procedure/placement/frequency:R arm/ R leg/R foot/ L leg/ L foot DTPI: Cleanse with NS, pat dry.  Place Xeroform over wound bed and cover with silicone foam dressing.  Change daily.   L foot unstageable/ L heel unstageable: Paint with betadine  and leave open to air.   R elbow/ Sacrum/Left ischial tuberosity Cleanse with Vashe (lawson [phone]) allow to air dry.  Apply silver  hydrofiber (LAWSON # [id number]) to wound bed.  Gently fill in undermining.  TOp with dry gauze and cover with silicone foam.    Elevate both heels in Prevalon boots (lawson # [phone]) to off load pressure.   Utilize LALM for moisture management and pressure redistribution. Onoging management for severe malnutrition and enteral feeds, followed by RD.  I have requested  updated weight.   Will not follow at this time.  Please re-consult if needed.  Darice Cooley MSN, RN, FNP-BC CWON Wound, Ostomy, Continence Nurse Outpatient [facility] [phone] Work cell phone:  [phone]

## 2023-11-26 DIAGNOSIS — L89154 Pressure ulcer of sacral region, stage 4: Secondary | ICD-10-CM | POA: Diagnosis not present

## 2023-11-26 DIAGNOSIS — A419 Sepsis, unspecified organism: Secondary | ICD-10-CM | POA: Diagnosis not present

## 2023-11-26 LAB — CBC WITH DIFFERENTIAL/PLATELET
Abs Immature Granulocytes: 0.16 K/uL — ABNORMAL HIGH (ref 0.00–0.07)
Basophils Absolute: 0.1 K/uL (ref 0.0–0.1)
Basophils Relative: 1 %
Eosinophils Absolute: 0.3 K/uL (ref 0.0–0.5)
Eosinophils Relative: 3 %
HCT: 24.2 % — ABNORMAL LOW (ref 39.0–52.0)
Hemoglobin: 7.3 g/dL — ABNORMAL LOW (ref 13.0–17.0)
Immature Granulocytes: 2 %
Lymphocytes Relative: 27 %
Lymphs Abs: 2.8 K/uL (ref 0.7–4.0)
MCH: 29.4 pg (ref 26.0–34.0)
MCHC: 30.2 g/dL (ref 30.0–36.0)
MCV: 97.6 fL (ref 80.0–100.0)
Monocytes Absolute: 1.3 K/uL — ABNORMAL HIGH (ref 0.1–1.0)
Monocytes Relative: 13 %
Neutro Abs: 5.9 K/uL (ref 1.7–7.7)
Neutrophils Relative %: 54 %
Platelets: 389 K/uL (ref 150–400)
RBC: 2.48 MIL/uL — ABNORMAL LOW (ref 4.22–5.81)
RDW: 21.6 % — ABNORMAL HIGH (ref 11.5–15.5)
Smear Review: NORMAL
WBC: 10.5 K/uL (ref 4.0–10.5)
nRBC: 1 % — ABNORMAL HIGH (ref 0.0–0.2)

## 2023-11-26 LAB — COMPREHENSIVE METABOLIC PANEL WITH GFR
ALT: 19 U/L (ref 0–44)
AST: 25 U/L (ref 15–41)
Albumin: 1.9 g/dL — ABNORMAL LOW (ref 3.5–5.0)
Alkaline Phosphatase: 108 U/L (ref 38–126)
Anion gap: 11 (ref 5–15)
BUN: 108 mg/dL — ABNORMAL HIGH (ref 8–23)
CO2: 22 mmol/L (ref 22–32)
Calcium: 8.5 mg/dL — ABNORMAL LOW (ref 8.9–10.3)
Chloride: 112 mmol/L — ABNORMAL HIGH (ref 98–111)
Creatinine, Ser: 0.93 mg/dL (ref 0.61–1.24)
GFR, Estimated: >60 mL/min (ref >60)
Glucose, Bld: 136 mg/dL — ABNORMAL HIGH (ref 70–99)
Potassium: 4.6 mmol/L (ref 3.5–5.1)
Sodium: 145 mmol/L (ref 135–145)
Total Bilirubin: 0.4 mg/dL (ref 0.0–1.2)
Total Protein: 6.7 g/dL (ref 6.5–8.1)

## 2023-11-26 LAB — GLUCOSE, CAPILLARY
Glucose-Capillary: 109 mg/dL — ABNORMAL HIGH (ref 70–99)
Glucose-Capillary: 110 mg/dL — ABNORMAL HIGH (ref 70–99)
Glucose-Capillary: 163 mg/dL — ABNORMAL HIGH (ref 70–99)
Glucose-Capillary: 164 mg/dL — ABNORMAL HIGH (ref 70–99)
Glucose-Capillary: 169 mg/dL — ABNORMAL HIGH (ref 70–99)
Glucose-Capillary: 200 mg/dL — ABNORMAL HIGH (ref 70–99)

## 2023-11-26 LAB — MAGNESIUM: Magnesium: 2.7 mg/dL — ABNORMAL HIGH (ref 1.7–2.4)

## 2023-11-26 LAB — PROCALCITONIN: Procalcitonin: 0.51 ng/mL

## 2023-11-26 LAB — C-REACTIVE PROTEIN: CRP: 3.1 mg/dL — ABNORMAL HIGH (ref <1.0)

## 2023-11-26 MED ORDER — MIDODRINE HCL 5 MG PO TABS
2.5000 mg | ORAL_TABLET | Freq: Three times a day (TID) | ORAL | Status: DC
  Administered 2023-11-27 – 2023-11-29 (×8): 2.5 mg
  Filled 2023-11-26 (×9): qty 1

## 2023-11-26 NOTE — Care Management (Signed)
 Patient identified in progression rounds as a possible LTACH admission. Spoke with daughter [name] about LTACH options and she is interested in speaking with liaison from [facility].       Alecxander, [place] (Daughter) [phone]
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
## 2023-11-26 NOTE — Plan of Care (Signed)
  Problem: Role Relationship: Goal: Family's ability to cope with current situation will improve Outcome: Progressing   Problem: Pain Management: Goal: Satisfaction with pain management regimen will improve Outcome: Progressing   

## 2023-11-27 DIAGNOSIS — A419 Sepsis, unspecified organism: Secondary | ICD-10-CM | POA: Diagnosis not present

## 2023-11-27 LAB — COMPREHENSIVE METABOLIC PANEL WITH GFR
ALT: 19 U/L (ref 0–44)
AST: 24 U/L (ref 15–41)
Albumin: 2 g/dL — ABNORMAL LOW (ref 3.5–5.0)
Alkaline Phosphatase: 108 U/L (ref 38–126)
Anion gap: 11 (ref 5–15)
BUN: 98 mg/dL — ABNORMAL HIGH (ref 8–23)
CO2: 24 mmol/L (ref 22–32)
Calcium: 8.4 mg/dL — ABNORMAL LOW (ref 8.9–10.3)
Chloride: 114 mmol/L — ABNORMAL HIGH (ref 98–111)
Creatinine, Ser: 0.96 mg/dL (ref 0.61–1.24)
GFR, Estimated: >60 mL/min (ref >60)
Glucose, Bld: 155 mg/dL — ABNORMAL HIGH (ref 70–99)
Potassium: 4.4 mmol/L (ref 3.5–5.1)
Sodium: 149 mmol/L — ABNORMAL HIGH (ref 135–145)
Total Bilirubin: 0.4 mg/dL (ref 0.0–1.2)
Total Protein: 6.9 g/dL (ref 6.5–8.1)

## 2023-11-27 LAB — CBC WITH DIFFERENTIAL/PLATELET
Abs Immature Granulocytes: 0.18 K/uL — ABNORMAL HIGH (ref 0.00–0.07)
Basophils Absolute: 0.1 K/uL (ref 0.0–0.1)
Basophils Relative: 1 %
Eosinophils Absolute: 0.4 K/uL (ref 0.0–0.5)
Eosinophils Relative: 4 %
HCT: 24.9 % — ABNORMAL LOW (ref 39.0–52.0)
Hemoglobin: 7.6 g/dL — ABNORMAL LOW (ref 13.0–17.0)
Immature Granulocytes: 2 %
Lymphocytes Relative: 25 %
Lymphs Abs: 2.4 K/uL (ref 0.7–4.0)
MCH: 29.8 pg (ref 26.0–34.0)
MCHC: 30.5 g/dL (ref 30.0–36.0)
MCV: 97.6 fL (ref 80.0–100.0)
Monocytes Absolute: 1.2 K/uL — ABNORMAL HIGH (ref 0.1–1.0)
Monocytes Relative: 13 %
Neutro Abs: 5.2 K/uL (ref 1.7–7.7)
Neutrophils Relative %: 55 %
Platelets: 386 K/uL (ref 150–400)
RBC: 2.55 MIL/uL — ABNORMAL LOW (ref 4.22–5.81)
RDW: 22.2 % — ABNORMAL HIGH (ref 11.5–15.5)
Smear Review: NORMAL
WBC: 9.4 K/uL (ref 4.0–10.5)
nRBC: 1.2 % — ABNORMAL HIGH (ref 0.0–0.2)

## 2023-11-27 LAB — PROCALCITONIN: Procalcitonin: 0.6 ng/mL

## 2023-11-27 LAB — GLUCOSE, CAPILLARY
Glucose-Capillary: 120 mg/dL — ABNORMAL HIGH (ref 70–99)
Glucose-Capillary: 129 mg/dL — ABNORMAL HIGH (ref 70–99)
Glucose-Capillary: 140 mg/dL — ABNORMAL HIGH (ref 70–99)
Glucose-Capillary: 146 mg/dL — ABNORMAL HIGH (ref 70–99)
Glucose-Capillary: 147 mg/dL — ABNORMAL HIGH (ref 70–99)
Glucose-Capillary: 148 mg/dL — ABNORMAL HIGH (ref 70–99)
Glucose-Capillary: 158 mg/dL — ABNORMAL HIGH (ref 70–99)
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
## 2023-11-27 NOTE — Care Management (Addendum)
 Notified by [facility] that they can offer a bed, pending auth. Reached out to to daughter, LVM.  Permitted [facility] to start auth in the meantime

## 2023-11-28 DIAGNOSIS — A419 Sepsis, unspecified organism: Secondary | ICD-10-CM | POA: Diagnosis not present

## 2023-11-28 LAB — BASIC METABOLIC PANEL WITH GFR
Anion gap: 10 (ref 5–15)
BUN: 75 mg/dL — ABNORMAL HIGH (ref 8–23)
CO2: 24 mmol/L (ref 22–32)
Calcium: 8.3 mg/dL — ABNORMAL LOW (ref 8.9–10.3)
Chloride: 113 mmol/L — ABNORMAL HIGH (ref 98–111)
Creatinine, Ser: 0.92 mg/dL (ref 0.61–1.24)
GFR, Estimated: >60 mL/min (ref >60)
Glucose, Bld: 142 mg/dL — ABNORMAL HIGH (ref 70–99)
Potassium: 4 mmol/L (ref 3.5–5.1)
Sodium: 147 mmol/L — ABNORMAL HIGH (ref 135–145)

## 2023-11-28 LAB — CBC
HCT: 26.3 % — ABNORMAL LOW (ref 39.0–52.0)
Hemoglobin: 8.1 g/dL — ABNORMAL LOW (ref 13.0–17.0)
MCH: 30.2 pg (ref 26.0–34.0)
MCHC: 30.8 g/dL (ref 30.0–36.0)
MCV: 98.1 fL (ref 80.0–100.0)
Platelets: 366 K/uL (ref 150–400)
RBC: 2.68 MIL/uL — ABNORMAL LOW (ref 4.22–5.81)
RDW: 22.5 % — ABNORMAL HIGH (ref 11.5–15.5)
WBC: 10.9 K/uL — ABNORMAL HIGH (ref 4.0–10.5)
nRBC: 1.2 % — ABNORMAL HIGH (ref 0.0–0.2)

## 2023-11-28 LAB — GLUCOSE, CAPILLARY
Glucose-Capillary: 119 mg/dL — ABNORMAL HIGH (ref 70–99)
Glucose-Capillary: 131 mg/dL — ABNORMAL HIGH (ref 70–99)
Glucose-Capillary: 132 mg/dL — ABNORMAL HIGH (ref 70–99)
Glucose-Capillary: 138 mg/dL — ABNORMAL HIGH (ref 70–99)
Glucose-Capillary: 140 mg/dL — ABNORMAL HIGH (ref 70–99)
Glucose-Capillary: 146 mg/dL — ABNORMAL HIGH (ref 70–99)

## 2023-11-28 LAB — TYPE AND SCREEN
ABO/RH(D): O POS
Antibody Screen: NEGATIVE

## 2023-11-28 MED ORDER — FREE WATER
200.0000 mL | Status: DC
  Administered 2023-11-28 – 2023-12-03 (×30): 200 mL

## 2023-11-28 NOTE — Plan of Care (Signed)
 Problem: Education: Goal: Ability to describe self-care measures that may prevent or decrease complications (Diabetes Survival Skills Education) will improve Outcome: Progressing Goal: Individualized Educational Video(s) Outcome: Progressing   Problem: Coping: Goal: Ability to adjust to condition or change in health will improve Outcome: Progressing   Problem: Fluid Volume: Goal: Ability to maintain a balanced intake and output will improve Outcome: Progressing   Problem: Health Behavior/Discharge Planning: Goal: Ability to identify and utilize available resources and services will improve Outcome: Progressing Goal: Ability to manage health-related needs will improve Outcome: Progressing   Problem: Metabolic: Goal: Ability to maintain appropriate glucose levels will improve Outcome: Progressing   Problem: Nutritional: Goal: Maintenance of adequate nutrition will improve Outcome: Progressing Goal: Progress toward achieving an optimal weight will improve Outcome: Progressing   Problem: Skin Integrity: Goal: Risk for impaired skin integrity will decrease Outcome: Progressing   Problem: Tissue Perfusion: Goal: Adequacy of tissue perfusion will improve Outcome: Progressing   Problem: Education: Goal: Knowledge of General Education information will improve Description: Including pain rating scale, medication(s)/side effects and non-pharmacologic comfort measures Outcome: Progressing   Problem: Health Behavior/Discharge Planning: Goal: Ability to manage health-related needs will improve Outcome: Progressing   Problem: Clinical Measurements: Goal: Ability to maintain clinical measurements within normal limits will improve Outcome: Progressing Goal: Will remain free from infection Outcome: Progressing Goal: Diagnostic test results will improve Outcome: Progressing Goal: Respiratory complications will improve Outcome: Progressing Goal: Cardiovascular complication will  be avoided Outcome: Progressing   Problem: Activity: Goal: Risk for activity intolerance will decrease Outcome: Progressing   Problem: Nutrition: Goal: Adequate nutrition will be maintained Outcome: Progressing   Problem: Coping: Goal: Level of anxiety will decrease Outcome: Progressing   Problem: Elimination: Goal: Will not experience complications related to bowel motility Outcome: Progressing Goal: Will not experience complications related to urinary retention Outcome: Progressing   Problem: Pain Managment: Goal: General experience of comfort will improve and/or be controlled Outcome: Progressing   Problem: Safety: Goal: Ability to remain free from injury will improve Outcome: Progressing   Problem: Skin Integrity: Goal: Risk for impaired skin integrity will decrease Outcome: Progressing   Problem: Education: Goal: Knowledge of disease or condition will improve Outcome: Progressing Goal: Knowledge of secondary prevention will improve (MUST DOCUMENT ALL) Outcome: Progressing Goal: Knowledge of patient specific risk factors will improve (DELETE if not current risk factor) Outcome: Progressing   Problem: Ischemic Stroke/TIA Tissue Perfusion: Goal: Complications of ischemic stroke/TIA will be minimized Outcome: Progressing   Problem: Coping: Goal: Will verbalize positive feelings about self Outcome: Progressing Goal: Will identify appropriate support needs Outcome: Progressing   Problem: Health Behavior/Discharge Planning: Goal: Ability to manage health-related needs will improve Outcome: Progressing Goal: Goals will be collaboratively established with patient/family Outcome: Progressing   Problem: Self-Care: Goal: Ability to participate in self-care as condition permits will improve Outcome: Progressing Goal: Verbalization of feelings and concerns over difficulty with self-care will improve Outcome: Progressing Goal: Ability to communicate needs  accurately will improve Outcome: Progressing   Problem: Nutrition: Goal: Risk of aspiration will decrease Outcome: Progressing Goal: Dietary intake will improve Outcome: Progressing   Problem: Education: Goal: Knowledge of the prescribed therapeutic regimen will improve Outcome: Progressing   Problem: Coping: Goal: Ability to identify and develop effective coping behavior will improve Outcome: Progressing   Problem: Clinical Measurements: Goal: Quality of life will improve Outcome: Progressing   Problem: Respiratory: Goal: Verbalizations of increased ease of respirations will increase Outcome: Progressing   Problem: Role Relationship: Goal: Family's ability to  cope with current situation will improve Outcome: Progressing Goal: Ability to verbalize concerns, feelings, and thoughts to partner or family member will improve Outcome: Progressing   Problem: Pain Management: Goal: Satisfaction with pain management regimen will improve Outcome: Progressing

## 2023-11-28 NOTE — Care Management (Addendum)
 [id number] Received VM from daughter, [name] last night at 6pm, called back this morning, LVM for her to return call.   [place], [place] (Daughter) [phone]   [phone] Spoke w [name]. She is agreeable to [facility], with potential DC to there tomorrow. Attending and [facility] liaison notified.   1600  Notified MD of P2P required by 11am tomorrow.
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
## 2023-11-28 NOTE — Progress Notes (Signed)
 All dressings changes completed by RN at 1700 per wound care orders.

## 2023-11-29 DIAGNOSIS — A419 Sepsis, unspecified organism: Secondary | ICD-10-CM | POA: Diagnosis not present

## 2023-11-29 LAB — GLUCOSE, CAPILLARY
Glucose-Capillary: 105 mg/dL — ABNORMAL HIGH (ref 70–99)
Glucose-Capillary: 111 mg/dL — ABNORMAL HIGH (ref 70–99)
Glucose-Capillary: 115 mg/dL — ABNORMAL HIGH (ref 70–99)
Glucose-Capillary: 117 mg/dL — ABNORMAL HIGH (ref 70–99)
Glucose-Capillary: 130 mg/dL — ABNORMAL HIGH (ref 70–99)
Glucose-Capillary: 156 mg/dL — ABNORMAL HIGH (ref 70–99)
Glucose-Capillary: 92 mg/dL (ref 70–99)

## 2023-11-29 NOTE — Plan of Care (Signed)
  Problem: Fluid Volume: Goal: Ability to maintain a balanced intake and output will improve Outcome: Progressing   Problem: Metabolic: Goal: Ability to maintain appropriate glucose levels will improve Outcome: Progressing   Problem: Nutritional: Goal: Progress toward achieving an optimal weight will improve Outcome: Progressing

## 2023-11-29 NOTE — Plan of Care (Signed)
 Problem: Education: Goal: Ability to describe self-care measures that may prevent or decrease complications (Diabetes Survival Skills Education) will improve Outcome: Progressing Goal: Individualized Educational Video(s) Outcome: Progressing   Problem: Coping: Goal: Ability to adjust to condition or change in health will improve Outcome: Progressing   Problem: Fluid Volume: Goal: Ability to maintain a balanced intake and output will improve Outcome: Progressing   Problem: Health Behavior/Discharge Planning: Goal: Ability to identify and utilize available resources and services will improve Outcome: Progressing Goal: Ability to manage health-related needs will improve Outcome: Progressing   Problem: Metabolic: Goal: Ability to maintain appropriate glucose levels will improve Outcome: Progressing   Problem: Nutritional: Goal: Maintenance of adequate nutrition will improve Outcome: Progressing Goal: Progress toward achieving an optimal weight will improve Outcome: Progressing   Problem: Skin Integrity: Goal: Risk for impaired skin integrity will decrease Outcome: Progressing   Problem: Tissue Perfusion: Goal: Adequacy of tissue perfusion will improve Outcome: Progressing   Problem: Education: Goal: Knowledge of General Education information will improve Description: Including pain rating scale, medication(s)/side effects and non-pharmacologic comfort measures Outcome: Progressing   Problem: Health Behavior/Discharge Planning: Goal: Ability to manage health-related needs will improve Outcome: Progressing   Problem: Clinical Measurements: Goal: Ability to maintain clinical measurements within normal limits will improve Outcome: Progressing Goal: Will remain free from infection Outcome: Progressing Goal: Diagnostic test results will improve Outcome: Progressing Goal: Respiratory complications will improve Outcome: Progressing Goal: Cardiovascular complication will  be avoided Outcome: Progressing   Problem: Activity: Goal: Risk for activity intolerance will decrease Outcome: Progressing   Problem: Nutrition: Goal: Adequate nutrition will be maintained Outcome: Progressing   Problem: Coping: Goal: Level of anxiety will decrease Outcome: Progressing   Problem: Elimination: Goal: Will not experience complications related to bowel motility Outcome: Progressing Goal: Will not experience complications related to urinary retention Outcome: Progressing   Problem: Pain Managment: Goal: General experience of comfort will improve and/or be controlled Outcome: Progressing   Problem: Safety: Goal: Ability to remain free from injury will improve Outcome: Progressing   Problem: Skin Integrity: Goal: Risk for impaired skin integrity will decrease Outcome: Progressing   Problem: Education: Goal: Knowledge of disease or condition will improve Outcome: Progressing Goal: Knowledge of secondary prevention will improve (MUST DOCUMENT ALL) Outcome: Progressing Goal: Knowledge of patient specific risk factors will improve (DELETE if not current risk factor) Outcome: Progressing   Problem: Ischemic Stroke/TIA Tissue Perfusion: Goal: Complications of ischemic stroke/TIA will be minimized Outcome: Progressing   Problem: Coping: Goal: Will verbalize positive feelings about self Outcome: Progressing Goal: Will identify appropriate support needs Outcome: Progressing   Problem: Health Behavior/Discharge Planning: Goal: Ability to manage health-related needs will improve Outcome: Progressing Goal: Goals will be collaboratively established with patient/family Outcome: Progressing   Problem: Self-Care: Goal: Ability to participate in self-care as condition permits will improve Outcome: Progressing Goal: Verbalization of feelings and concerns over difficulty with self-care will improve Outcome: Progressing Goal: Ability to communicate needs  accurately will improve Outcome: Progressing   Problem: Nutrition: Goal: Risk of aspiration will decrease Outcome: Progressing Goal: Dietary intake will improve Outcome: Progressing   Problem: Education: Goal: Knowledge of the prescribed therapeutic regimen will improve Outcome: Progressing   Problem: Coping: Goal: Ability to identify and develop effective coping behavior will improve Outcome: Progressing   Problem: Clinical Measurements: Goal: Quality of life will improve Outcome: Progressing   Problem: Respiratory: Goal: Verbalizations of increased ease of respirations will increase Outcome: Progressing   Problem: Role Relationship: Goal: Family's ability to  cope with current situation will improve Outcome: Progressing Goal: Ability to verbalize concerns, feelings, and thoughts to partner or family member will improve Outcome: Progressing   Problem: Pain Management: Goal: Satisfaction with pain management regimen will improve Outcome: Progressing
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
## 2023-11-29 NOTE — TOC Progression Note (Addendum)
 Transition of Care [facility]) - Progression Note    Patient Details  Name: [name] [name] MRN: [id number] Date of Birth: September 19, 1945  [facility] CM/SW Contact  Corean JAYSON Canary, RN Phone Number: 11/29/2023, 8:47 AM  Clinical Narrative:     Kristeen  from [employer] will let  the team know of appeal status.  IP CM will continue to follow 1050 Cir screened, is not a candidate for CIR  1100 appeal letter for LTAC signed by provider, faxed it to Ciera at [phone]  1300 Had to refax(email) securely- appeal has been sent to [facility] for review Expected Discharge Plan: Home w Home Health Services Barriers to Discharge: Continued Medical Work up               Expected Discharge Plan and Services       Living arrangements for the past 2 months: Single Family Home                                       Social Drivers of Health (SDOH) Interventions SDOH Screenings   Food Insecurity: No Food Insecurity (10/24/2023)  Housing: Low Risk  (10/24/2023)  Transportation Needs: No Transportation Needs (10/24/2023)  Utilities: Not At Risk (10/24/2023)  Social Connections: Moderately Integrated (10/24/2023)  Tobacco Use: Medium Risk (10/24/2023)    Readmission Risk Interventions     No data to display
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
## 2023-11-30 DIAGNOSIS — E870 Hyperosmolality and hypernatremia: Secondary | ICD-10-CM | POA: Diagnosis not present

## 2023-11-30 DIAGNOSIS — A419 Sepsis, unspecified organism: Secondary | ICD-10-CM | POA: Diagnosis not present

## 2023-11-30 LAB — CBC
HCT: 25.9 % — ABNORMAL LOW (ref 39.0–52.0)
Hemoglobin: 8 g/dL — ABNORMAL LOW (ref 13.0–17.0)
MCH: 30.8 pg (ref 26.0–34.0)
MCHC: 30.9 g/dL (ref 30.0–36.0)
MCV: 99.6 fL (ref 80.0–100.0)
Platelets: 322 K/uL (ref 150–400)
RBC: 2.6 MIL/uL — ABNORMAL LOW (ref 4.22–5.81)
RDW: 22.6 % — ABNORMAL HIGH (ref 11.5–15.5)
WBC: 10.3 K/uL (ref 4.0–10.5)
nRBC: 0.7 % — ABNORMAL HIGH (ref 0.0–0.2)

## 2023-11-30 LAB — CULTURE, BLOOD (ROUTINE X 2)
Culture: NO GROWTH
Culture: NO GROWTH
Special Requests: ADEQUATE
Special Requests: ADEQUATE

## 2023-11-30 LAB — BASIC METABOLIC PANEL WITH GFR
Anion gap: 9 (ref 5–15)
BUN: 64 mg/dL — ABNORMAL HIGH (ref 8–23)
CO2: 25 mmol/L (ref 22–32)
Calcium: 8.1 mg/dL — ABNORMAL LOW (ref 8.9–10.3)
Chloride: 113 mmol/L — ABNORMAL HIGH (ref 98–111)
Creatinine, Ser: 0.78 mg/dL (ref 0.61–1.24)
GFR, Estimated: >60 mL/min (ref >60)
Glucose, Bld: 116 mg/dL — ABNORMAL HIGH (ref 70–99)
Potassium: 4 mmol/L (ref 3.5–5.1)
Sodium: 147 mmol/L — ABNORMAL HIGH (ref 135–145)

## 2023-11-30 LAB — GLUCOSE, CAPILLARY
Glucose-Capillary: 102 mg/dL — ABNORMAL HIGH (ref 70–99)
Glucose-Capillary: 105 mg/dL — ABNORMAL HIGH (ref 70–99)
Glucose-Capillary: 111 mg/dL — ABNORMAL HIGH (ref 70–99)
Glucose-Capillary: 116 mg/dL — ABNORMAL HIGH (ref 70–99)
Glucose-Capillary: 158 mg/dL — ABNORMAL HIGH (ref 70–99)
Glucose-Capillary: 79 mg/dL (ref 70–99)
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
## 2023-11-30 NOTE — Plan of Care (Signed)
  Problem: Fluid Volume: Goal: Ability to maintain a balanced intake and output will improve Outcome: Progressing   Problem: Metabolic: Goal: Ability to maintain appropriate glucose levels will improve Outcome: Progressing   Problem: Nutritional: Goal: Maintenance of adequate nutrition will improve Outcome: Progressing Goal: Progress toward achieving an optimal weight will improve Outcome: Progressing   

## 2023-11-30 NOTE — Plan of Care (Signed)

## 2023-12-01 ENCOUNTER — Encounter (HOSPITAL_COMMUNITY): Payer: Self-pay | Admitting: Internal Medicine

## 2023-12-01 DIAGNOSIS — A419 Sepsis, unspecified organism: Secondary | ICD-10-CM | POA: Diagnosis not present

## 2023-12-01 LAB — CBC
HCT: 26.6 % — ABNORMAL LOW (ref 39.0–52.0)
Hemoglobin: 8.1 g/dL — ABNORMAL LOW (ref 13.0–17.0)
MCH: 30.3 pg (ref 26.0–34.0)
MCHC: 30.5 g/dL (ref 30.0–36.0)
MCV: 99.6 fL (ref 80.0–100.0)
Platelets: 317 K/uL (ref 150–400)
RBC: 2.67 MIL/uL — ABNORMAL LOW (ref 4.22–5.81)
RDW: 23.1 % — ABNORMAL HIGH (ref 11.5–15.5)
WBC: 8.6 K/uL (ref 4.0–10.5)
nRBC: 0.5 % — ABNORMAL HIGH (ref 0.0–0.2)

## 2023-12-01 LAB — BASIC METABOLIC PANEL WITH GFR
Anion gap: 11 (ref 5–15)
BUN: 61 mg/dL — ABNORMAL HIGH (ref 8–23)
CO2: 24 mmol/L (ref 22–32)
Calcium: 8.2 mg/dL — ABNORMAL LOW (ref 8.9–10.3)
Chloride: 109 mmol/L (ref 98–111)
Creatinine, Ser: 0.67 mg/dL (ref 0.61–1.24)
GFR, Estimated: >60 mL/min (ref >60)
Glucose, Bld: 127 mg/dL — ABNORMAL HIGH (ref 70–99)
Potassium: 3.8 mmol/L (ref 3.5–5.1)
Sodium: 144 mmol/L (ref 135–145)

## 2023-12-01 LAB — PROTIME-INR
INR: 1.2 (ref 0.8–1.2)
Prothrombin Time: 16.1 s — ABNORMAL HIGH (ref 11.4–15.2)

## 2023-12-01 LAB — GLUCOSE, CAPILLARY
Glucose-Capillary: 128 mg/dL — ABNORMAL HIGH (ref 70–99)
Glucose-Capillary: 93 mg/dL (ref 70–99)
Glucose-Capillary: 129 mg/dL — ABNORMAL HIGH (ref 70–99)
Glucose-Capillary: 114 mg/dL — ABNORMAL HIGH (ref 70–99)
Glucose-Capillary: 95 mg/dL (ref 70–99)
Glucose-Capillary: 118 mg/dL — ABNORMAL HIGH (ref 70–99)

## 2023-12-01 NOTE — Plan of Care (Signed)
  Problem: Fluid Volume: Goal: Ability to maintain a balanced intake and output will improve Outcome: Progressing   Problem: Health Behavior/Discharge Planning: Goal: Ability to identify and utilize available resources and services will improve Outcome: Progressing Goal: Ability to manage health-related needs will improve Outcome: Progressing   Problem: Metabolic: Goal: Ability to maintain appropriate glucose levels will improve Outcome: Progressing
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
## 2023-12-02 DIAGNOSIS — A419 Sepsis, unspecified organism: Secondary | ICD-10-CM | POA: Diagnosis not present

## 2023-12-02 LAB — GLUCOSE, CAPILLARY
Glucose-Capillary: 95 mg/dL (ref 70–99)
Glucose-Capillary: 68 mg/dL — ABNORMAL LOW (ref 70–99)
Glucose-Capillary: 72 mg/dL (ref 70–99)
Glucose-Capillary: 71 mg/dL (ref 70–99)
Glucose-Capillary: 82 mg/dL (ref 70–99)
Glucose-Capillary: 109 mg/dL — ABNORMAL HIGH (ref 70–99)
Glucose-Capillary: 137 mg/dL — ABNORMAL HIGH (ref 70–99)

## 2023-12-02 LAB — CBC
HCT: 28.5 % — ABNORMAL LOW (ref 39.0–52.0)
Hemoglobin: 8.5 g/dL — ABNORMAL LOW (ref 13.0–17.0)
MCH: 30 pg (ref 26.0–34.0)
MCHC: 29.8 g/dL — ABNORMAL LOW (ref 30.0–36.0)
MCV: 100.7 fL — ABNORMAL HIGH (ref 80.0–100.0)
Platelets: 321 K/uL (ref 150–400)
RBC: 2.83 MIL/uL — ABNORMAL LOW (ref 4.22–5.81)
RDW: 23.2 % — ABNORMAL HIGH (ref 11.5–15.5)
WBC: 9 K/uL (ref 4.0–10.5)
nRBC: 0.3 % — ABNORMAL HIGH (ref 0.0–0.2)

## 2023-12-02 LAB — BASIC METABOLIC PANEL WITH GFR
Anion gap: 13 (ref 5–15)
BUN: 54 mg/dL — ABNORMAL HIGH (ref 8–23)
CO2: 23 mmol/L (ref 22–32)
Calcium: 8.6 mg/dL — ABNORMAL LOW (ref 8.9–10.3)
Chloride: 110 mmol/L (ref 98–111)
Creatinine, Ser: 0.62 mg/dL (ref 0.61–1.24)
GFR, Estimated: >60 mL/min (ref >60)
Glucose, Bld: 87 mg/dL (ref 70–99)
Potassium: 3.9 mmol/L (ref 3.5–5.1)
Sodium: 146 mmol/L — ABNORMAL HIGH (ref 135–145)

## 2023-12-02 MED ORDER — GLUCERNA 1.5 CAL PO LIQD: 1000.0000 mL | ORAL | Status: AC

## 2023-12-02 MED ORDER — BISACODYL 10 MG RE SUPP: 10.0000 mg | Freq: Every day | RECTAL | Status: AC | PRN

## 2023-12-02 MED ORDER — PROSOURCE TF20 ENFIT COMPATIBL EN LIQD
40.0000 mL | Freq: Every day | ENTERAL | Status: DC
  Administered 2023-12-03: 40 mL
  Filled 2023-12-02: qty 60

## 2023-12-02 MED ORDER — ADULT MULTIVITAMIN W/MINERALS CH
1.0000 | ORAL_TABLET | Freq: Every day | ORAL | Status: DC
  Administered 2023-12-03: 1 via ORAL
  Filled 2023-12-02: qty 1

## 2023-12-02 MED ORDER — FREE WATER: 200.0000 mL | Status: AC

## 2023-12-02 MED ORDER — DEXTROSE-SODIUM CHLORIDE 5-0.45 % IV SOLN: INTRAVENOUS | Status: DC

## 2023-12-02 MED ORDER — INSULIN ASPART 100 UNIT/ML IJ SOLN: 0.0000 [IU] | INTRAMUSCULAR | Status: AC

## 2023-12-02 MED ORDER — FLUDROCORTISONE ACETATE 0.1 MG PO TABS
0.1000 mg | ORAL_TABLET | Freq: Two times a day (BID) | ORAL | Status: DC
  Administered 2023-12-02 – 2023-12-03 (×2): 0.1 mg via ORAL
  Filled 2023-12-02 (×3): qty 1

## 2023-12-02 MED ORDER — JUVEN PO PACK: 1.0000 | PACK | Freq: Two times a day (BID) | ORAL | Status: AC

## 2023-12-02 MED ORDER — INSULIN GLARGINE-YFGN 100 UNIT/ML ~~LOC~~ SOLN: 18.0000 [IU] | Freq: Every day | SUBCUTANEOUS | Status: AC

## 2023-12-02 MED ORDER — PANTOPRAZOLE SODIUM 40 MG IV SOLR: 40.0000 mg | Freq: Two times a day (BID) | INTRAVENOUS | Status: AC

## 2023-12-02 MED ORDER — HEPARIN SODIUM (PORCINE) 5000 UNIT/ML IJ SOLN: 5000.0000 [IU] | Freq: Three times a day (TID) | INTRAMUSCULAR | Status: AC

## 2023-12-02 MED ORDER — FLUDROCORTISONE ACETATE 0.1 MG PO TABS
0.1000 mg | ORAL_TABLET | Freq: Two times a day (BID) | ORAL | Status: AC
Start: 2023-12-02 — End: ?

## 2023-12-02 MED ORDER — ADULT MULTIVITAMIN W/MINERALS CH: 1.0000 | ORAL_TABLET | Freq: Every day | ORAL | Status: AC
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
## 2023-12-02 NOTE — Progress Notes (Signed)
 Nutrition Brief Note  Patient scheduled for PEG placement today. Will monitor and re-initiate tube feedings when able. Continues with negligible PO intake. Otherwise stable.  INTERVENTION:  When appropriate, re-initiate tube feeding via PEG: Glucerna 1.5 at 50 ml/hr (1200 ml per day) 200 ml FWF Q6H per MD   Provides 1800 kcal , 99 gm protein, 1711ml free water daily (TF + FWF)   MVI with minerals daily per tube 500 mg Vitamin C daily per tube x 30 days for wound healing - completed; recommend redraw 1 packet Juven BID, to support wound healing Continue Dysphagia 1 diet with thin liquids  Nursing to assist with meals Encourage PO intake House trays  Continue Magic cup TID with meals, each supplement provides 290 kcal and 9 grams of protein Collect new weight to assess trend as newest weight appears pulled forward     NUTRITION DIAGNOSIS:  Severe Malnutrition related to chronic illness as evidenced by severe muscle depletion, severe fat depletion, percent weight loss (has lost 34 lbs, 19% in 10 months). - remains applicable   GOAL:  Patient will meet greater than or equal to 90% of their needs - not progressing  Blair Deaner MS, RD, LDN Registered Dietitian Clinical Nutrition RD Inpatient Contact Info in Amion

## 2023-12-02 NOTE — Discharge Summary (Incomplete)
 Physician Discharge Summary  [name] [name] FMW:[id number] DOB: [birth year] DOA: 10/23/2023  PCP: [name], MD  Admit date: 10/23/2023 Discharge date: 12/02/2023  Admitted From: ***(Home, ALF, ILF, SNF) Disposition:  ***(Home /***facility name / Residential Hospice)  Recommendations for Outpatient Follow-up:  Follow up with PCP in 1-2 weeks Please obtain BMP/CBC in one week Please follow up on the following pending results:  Home Health:*** (YES/NO) Equipment/Devices:*** (oxygen ?L, foley, PICC line--date placed, wound care, Pleurx)  Discharge Condition:*** (Stable, guarded, hospice) CODE STATUS:*** (FULL, DNR, Comfort Care) Diet recommendation: Heart Healthy / Carb Modified / Regular / Dysphagia   Brief/Interim Summary: You may copy/paste interim summary or write brief hospital course depending on length of stay  Discharge Diagnoses:  Principal Problem:   Sepsis (HCC) Active Problems:   Lower urinary tract infectious disease   Ulcer of right ankle (HCC)   Hyperkalemia   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HCC)   Acute on chronic anemia   Essential hypertension   Diabetes mellitus without complication (HCC)   HLD (hyperlipidemia)   Iron  deficiency anemia   Paraplegia (HCC)   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)   Malnutrition of moderate degree   Anemia of chronic disease   Neurocognitive deficits   Renal tubular acidosis, type 4   Sacral osteomyelitis (HCC)   Catheter-associated urinary tract infection   Decubitus ulcer of sacral region, stage 4 (HCC)   Protein-calorie malnutrition, severe   Palliative care by specialist   Fecal occult blood test positive   Acute stroke due to ischemia [facility])    Discharge Instructions  Discharge Instructions     Diet - low sodium heart healthy   Complete by: As directed    Discharge instructions   Complete by: As directed    [facility] Patient is on dysphagia 1 with thin liquid diet but  main feeding and medication going through NG tube   Discharge wound care:   Complete by: As directed    Wound care  Daily      Comments: R arm/ R leg/R foot/ L leg/ L foot DTPI: Cleanse with NS, pat dry.  Place Xeroform over wound bed and cover with silicone foam dressing.  Change daily.   L foot unstageable/ L heel unstageable: Paint with betadine  and leave open to air.   R elbow/ Sacrum/Left ischial tuberosity Cleanse with Vashe (lawson [phone]) allow to air dry.  Apply silver  hydrofiber (LAWSON # [id number]) to wound bed.  Gently fill in undermining.  TOp with dry gauze and cover with silicone foam.    Elevate both heels in Prevalon boots (lawson # [phone]) to off load pressure.   Utilize LALM for moisture management and pressure redistribution.       Utilize LALM for moisture management and pressure redistribution.   Increase activity slowly   Complete by: As directed       Allergies as of 12/02/2023       Reactions   Baclofen Other (See Comments)   Left shoulder twitching/jerking   Lisinopril  Other (See Comments)   Hyperkalemia (a high level of the electrolyte potassium in the blood)        Medication List     STOP taking these medications    aspirin  EC 81 MG tablet   ergocalciferol 1.25 MG (50000 UT) capsule Commonly known as: VITAMIN D2   ferrous sulfate  325 (65 FE) MG tablet   losartan  50 MG tablet Commonly known as: COZAAR    magnesium  oxide 400 (240  Mg) MG tablet Commonly known as: MAG-OX   metFORMIN  750 MG 24 hr tablet Commonly known as: GLUCOPHAGE -XR   omeprazole  20 MG capsule Commonly known as: PRILOSEC    pravastatin  20 MG tablet Commonly known as: PRAVACHOL        TAKE these medications    bisacodyl  10 MG suppository Commonly known as: DULCOLAX Place 1 suppository (10 mg total) rectally daily as needed for moderate constipation.   feeding supplement (GLUCERNA 1.5 CAL) Liqd Place 1,000 mLs into feeding tube daily. Start taking on:  December 03, 2023   nutrition supplement (JUVEN) Pack Place 1 packet into feeding tube 2 (two) times daily between meals. Start taking on: December 03, 2023   fludrocortisone  0.1 MG tablet Commonly known as: FLORINEF  Take 1 tablet (0.1 mg total) by mouth 2 (two) times daily. What changed:  how much to take when to take this   free water Soln Place 200 mLs into feeding tube every 4 (four) hours.   heparin  5000 UNIT/ML injection Inject 1 mL (5,000 Units total) into the skin every 8 (eight) hours.   insulin  aspart 100 UNIT/ML injection Commonly known as: novoLOG  Inject 0-15 Units into the skin every 4 (four) hours.   insulin  glargine-yfgn 100 UNIT/ML injection Commonly known as: SEMGLEE Inject 0.18 mLs (18 Units total) into the skin daily. Start taking on: December 03, 2023   multivitamin with minerals Tabs tablet Take 1 tablet by mouth daily. Start taking on: December 03, 2023   pantoprazole  40 MG injection Commonly known as: PROTONIX  Inject 40 mg into the vein every 12 (twelve) hours.               Discharge Care Instructions  (From admission, onward)           Start     Ordered   12/02/23 0000  Discharge wound care:       Comments: Wound care  Daily      Comments: R arm/ R leg/R foot/ L leg/ L foot DTPI: Cleanse with NS, pat dry.  Place Xeroform over wound bed and cover with silicone foam dressing.  Change daily.   L foot unstageable/ L heel unstageable: Paint with betadine  and leave open to air.   R elbow/ Sacrum/Left ischial tuberosity Cleanse with Vashe (lawson [phone]) allow to air dry.  Apply silver  hydrofiber (LAWSON # [id number]) to wound bed.  Gently fill in undermining.  TOp with dry gauze and cover with silicone foam.    Elevate both heels in Prevalon boots (lawson # [phone]) to off load pressure.   Utilize LALM for moisture management and pressure redistribution.       Utilize LALM for moisture management and pressure redistribution.    12/02/23 1601            Follow-up Information     [facility]. Schedule an appointment as soon as possible for a visit in 1 month(s).   Specialty: Neurology Why: stroke clinic Contact information: [address] [place]  [address] [phone]               Allergies  Allergen Reactions   Baclofen Other (See Comments)    Left shoulder twitching/jerking   Lisinopril  Other (See Comments)    Hyperkalemia (a high level of the electrolyte potassium in the blood)    Consultations: ***Specify Physician/Group   Procedures/Studies: DG Chest Port 1 View Result Date: 11/25/2023 CLINICAL DATA:  Shortness of breath. EXAM: PORTABLE CHEST 1 VIEW COMPARISON:  11/24/2023 FINDINGS: The cardio pericardial silhouette is enlarged. Left base collapse/consolidation is similar to prior. Right central line tip overlies the low SVC level as before. Feeding tube tip is positioned in the stomach. Telemetry leads overlie the chest. IMPRESSION: No substantial interval change. Left base collapse/consolidation. Electronically Signed   By: Camellia Candle M.D.   On: 11/25/2023 06:47   DG Abd Portable 1V Result Date: 11/24/2023 EXAM: 2 VIEW XRAY OF THE ABDOMEN AND PELVIS 11/24/2023 07:07:00 AM COMPARISON: CT abdomen and pelvis 11/19/2023. CLINICAL HISTORY: 78 year old male with nausea. FINDINGS: LINES, TUBES AND DEVICES: Enteric feeding tube terminates in the left upper quadrant, proximal stomach. BOWEL: Nonobstructive bowel gas pattern, with fairly bulky retained stool mixed with oral contrast persistent in the rectosigmoid colon. SOFT TISSUES: Severe calcified femoral artery atherosclerosis again noted. No opaque urinary calculi. BONES: Unhealed pathologic fracture of the left femoral neck redemonstrated, with superimposed sacral decubitus wound better demonstrated on recent CT. IMPRESSION: 1. Nonobstructed bowel gas pattern. bulky retained stool  mixed with oral contrast persistent in the rectosigmoid colon. 2. Pathologic fracture of the left femoral neck, in conjunction with sacral decubitus wound seen on recent CT. 3. Enteric feeding tube terminates in the proximal stomach. Electronically signed by: Helayne Hurst MD 11/24/2023 07:15 AM EST RP Workstation: [id number]   DG CHEST PORT 1 VIEW Result Date: 11/24/2023 EXAM: PORTABLE CHEST X-RAY, 1 VIEW 11/24/2023 07:07:00 AM COMPARISON: Portable chest X-ray from 11/23/2023 and earlier. CLINICAL HISTORY: 78 year old male with aspiration. FINDINGS: LINES, TUBES AND DEVICES: Enteric feeding tube terminates in the left upper quadrant, proximal stomach. Stable right chest dual lumen vascular catheter. LUNGS AND PLEURA: Low but mildly improved lung volumes. Patchy and confluent left lung base opacity does not appear significantly changed. Left lung base opacity suspicious for aspiration in this setting. No pneumothorax, pulmonary edema, pleural effusion or areas of worsening ventilation. HEART AND MEDIASTINUM: No acute abnormality of the cardiac and mediastinal silhouettes. BONES AND SOFT TISSUES: Partially visible cervical anterior and posterior fusion hardware. No acute osseous abnormality. Paucity bowel gas in the upper abdomen. IMPRESSION: 1. Left lung base opacity suspicious for aspiration in this setting, not significantly changed. 2. Stable lines and tubes, enteric feeding tube terminates in the proximal stomach. Electronically signed by: Helayne Hurst MD 11/24/2023 07:12 AM EST RP Workstation: [id number]   DG Chest Port 1 View Result Date: 11/23/2023 EXAM: 1 VIEW(S) XRAY OF THE CHEST 11/23/2023 07:36:00 AM COMPARISON: 11/22/2023 at 8:29 AM. CLINICAL HISTORY: SOB (shortness of breath). FINDINGS: LINES, TUBES AND DEVICES: Right IJ catheter has been repositioned; the tip is now in the projection of the superior cavoatrial junction. Enteric tube tip is in the stomach. LUNGS AND PLEURA: Lung volumes are low.  There is persistent bibasilar atelectasis. Decreased interstitial edema compared with prior exam. No focal pulmonary opacity. No pleural effusion. No pneumothorax. HEART AND MEDIASTINUM: No acute abnormality of the cardiac and mediastinal silhouettes. BONES AND SOFT TISSUES: No acute osseous abnormality. IMPRESSION: 1. Right internal jugular catheter tip projects to the superior cavoatrial junction, appropriate position. 2. Enteric tube tip projects over the stomach, appropriate position. 3. Decreased interstitial edema compared with prior exam. Electronically signed by: Waddell Calk MD 11/23/2023 07:40 AM EST RP Workstation: [id number]   IR TUNNELED CENTRAL VENOUS CATH [facility] W IMG Result Date: 11/22/2023 INDICATION: Malpositioned tunneled central venous catheter. EXAM: FLUOROSCOPIC AND ULTRASOUND GUIDED PLACEMENT OF A TUNNELED CENTRAL VENOUS CATHETER Physician: Juliene SAUNDERS. Philip, MD FLUOROSCOPY TIME:  Radiation Exposure Index (as provided by the fluoroscopic  device): 9 mGy Kerma MEDICATIONS: 1% lidocaine  for local anesthetic ANESTHESIA/SEDATION: None PROCEDURE: Informed consent was obtained for tunneled central venous catheter exchange. Right chest and catheter were prepped and draped in sterile fashion. Maximal barrier sterile technique was utilized including caps, mask, sterile gowns, sterile gloves, sterile drape, hand hygiene and skin antiseptic. Skin was anesthetized around the catheter with 1% lidocaine . The retention suture was cut. The catheter was retracted and the cuff was exposed. The old catheter was removed over a 0.018 wire. New dual lumen Powerline catheter was cut to 22 cm. The catheter was easily advanced over the wire and the tip was placed at the superior cavoatrial junction. Catheter placement was confirmed with fluoroscopy. Both lumens aspirated and flushed well. Both lumens were flushed with saline. Both lumens were capped and clamped. Catheter was sutured to skin. Bandage was placed.  Fluoroscopic images were taken and saved for this procedure. FINDINGS: The right jugular catheter was crossing the midline and the tip was near the left subclavian vein. New catheter tip is at the superior cavoatrial junction. COMPLICATIONS: None IMPRESSION: Successful exchange of the tunneled central venous catheter using fluoroscopy. Electronically Signed   By: Juliene Balder M.D.   On: 11/22/2023 17:16   DG Chest Port 1 View Result Date: 11/22/2023 EXAM: 1 VIEW(S) XRAY OF THE CHEST 11/22/2023 08:33:00 AM COMPARISON: 10/28/2023 CLINICAL HISTORY: SOB (shortness of breath) FINDINGS: LINES, TUBES AND DEVICES: Right subclavian approach central venous catheter is malpositioned with the tip terminating in the upstream left brachiocephalic vein. A weighted feeding tube courses below the diaphragm with the distal tip in the left upper quadrant, within the region of the stomach. LUNGS AND PLEURA: Low lung volumes with bronchovascular crowding. Patchy airspace opacities in the left lung base, likely atelectasis. No pulmonary edema. No pleural effusion. No pneumothorax. HEART AND MEDIASTINUM: Tortuous aorta with aortic atherosclerosis. Mild cardiomegaly. BONES AND SOFT TISSUES: Cervical fusion hardware again noted.  multilevel thoracic osteophytosis. IMPRESSION: 1. Malpositioned right subclavian approach central venous catheter with the tip in the upstream left brachiocephalic vein; recommend repositioning. 2. Low lung volumes with patchy left basilar airspace opacities, likely atelectasis. 3. These results will be called to the ordering clinician or representative by the radiologist assistant and communication documented in the pacs or clario dashboard. Electronically signed by: Rogelia Myers MD 11/22/2023 09:02 AM EST RP Workstation: [id number]   CT ABDOMEN PELVIS W CONTRAST Result Date: 11/19/2023 CLINICAL DATA:  Abdominal pain. EXAM: CT ABDOMEN AND PELVIS WITH CONTRAST TECHNIQUE: Multidetector CT imaging of the  abdomen and pelvis was performed using the standard protocol following bolus administration of intravenous contrast. RADIATION DOSE REDUCTION: This exam was performed according to the departmental dose-optimization program which includes automated exposure control, adjustment of the mA and/or kV according to patient size and/or use of iterative reconstruction technique. CONTRAST:  75mL OMNIPAQUE IOHEXOL 350 MG/ML SOLN COMPARISON:  CT dated 10/23/2023. FINDINGS: Evaluation of this exam is limited due to respiratory motion and streak artifact caused by patient's arms. Lower chest: Partially visualized trace bilateral pleural effusions and bibasilar atelectasis or infiltrate. There is mild cardiomegaly. Coronary vascular calcification. No intra-abdominal free air or free fluid. Hepatobiliary: The liver is unremarkable. Mild biliary dilatation. The gallbladder is unremarkable. Pancreas: Unremarkable. No pancreatic ductal dilatation or surrounding inflammatory changes. Spleen: Normal in size without focal abnormality. Adrenals/Urinary Tract: The adrenal glands are unremarkable. Bilateral renal cysts. There is no hydronephrosis on either side. There is symmetric enhancement and excretion of contrast by both kidneys. The visualized ureters  appear unremarkable. Mild trabeculated appearance of the bladder wall likely related to chronic bladder outlet obstruction. Stomach/Bowel: Feeding tube with tip in the body of the stomach. There is diffuse thickened appearance of the gastric wall likely related to underdistention. Gastritis is not excluded. Clinical correlation is recommended. There is no bowel obstruction. The appendix is normal. Vascular/Lymphatic: Multiple filter iliac atherosclerotic disease. The IVC is unremarkable. No portal venous gas. There is no adenopathy. Reproductive: The prostate and seminal vesicles are grossly unremarkable. Other: Diffuse subcutaneous edema. Sacral decubitus ulcer extending to the level of  the bone. Irregularity of the posterior cortex of the distal sacrum/coccyx area concerning for osteomyelitis. Clinical correlation recommended. MRI or a white blood cell nuclear scan may provide better evaluation. No fluid collection or abscess. Musculoskeletal: Advanced osteopenia and degenerative changes of the spine. Chronic displaced fracture of the left femoral neck. No acute osseous pathology. IMPRESSION: 1. Sacral decubitus ulcer with findings concerning for osteomyelitis. MRI or a white blood cell nuclear scan may provide better evaluation. No fluid collection or abscess. 2. Underdistention of the stomach versus gastritis. No bowel obstruction. Normal appendix. 3. Partially visualized trace bilateral pleural effusions and bibasilar atelectasis or infiltrate. 4.  Aortic Atherosclerosis (ICD10-I70.0). Electronically Signed   By: Vanetta Chou M.D.   On: 11/19/2023 20:30   (Echo, Carotid, EGD, Colonoscopy, ERCP)    Subjective:   Discharge Exam: Vitals:   12/02/23 0800 12/02/23 1200  BP: (!) 156/95 (!) 158/97  Pulse: 94 91  Resp: 16 16  Temp: (!) 97.4 F (36.3 C)   SpO2: 99% 100%   Vitals:   12/02/23 0351 12/02/23 0400 12/02/23 0800 12/02/23 1200  BP: (!) 173/102 (!) 161/101 (!) 156/95 (!) 158/97  Pulse: 97 94 94 91  Resp: 14 14 16 16   Temp:   (!) 97.4 F (36.3 C)   TempSrc:   Oral   SpO2: 98% 100% 99% 100%  Weight:      Height:        General: Pt is alert, awake, not in acute distress Cardiovascular: RRR, S1/S2 +, no rubs, no gallops Respiratory: CTA bilaterally, no wheezing, no rhonchi Abdominal: Soft, NT, ND, bowel sounds + Extremities: no edema, no cyanosis    The results of significant diagnostics from this hospitalization (including imaging, microbiology, ancillary and laboratory) are listed below for reference.     Microbiology: Recent Results (from the past 240 hours)  Culture, blood (Routine X 2) w Reflex to ID Panel     Status: None   Collection Time:  11/25/23  1:51 PM   Specimen: BLOOD  Result Value Ref Range Status   Specimen Description BLOOD SITE NOT SPECIFIED  Final   Special Requests   Final    BOTTLES DRAWN AEROBIC ONLY Blood Culture adequate volume   Culture   Final    NO GROWTH 5 DAYS Performed at [facility], [address], [place], [place] [address]    Report Status 11/30/2023 FINAL  Final  Culture, blood (Routine X 2) w Reflex to ID Panel     Status: None   Collection Time: 11/25/23  1:51 PM   Specimen: BLOOD  Result Value Ref Range Status   Specimen Description BLOOD SITE NOT SPECIFIED  Final   Special Requests   Final    BOTTLES DRAWN AEROBIC ONLY Blood Culture adequate volume   Culture   Final    NO GROWTH 5 DAYS Performed at [facility], [address], [place], [place]  [address]    Report Status 11/30/2023 FINAL  Final     Labs: BNP (last 3 results) No results for input(s): BNP in the last 8760 hours. Basic Metabolic Panel: Recent Labs  Lab 11/26/23 0243 11/27/23 0344 11/28/23 0830 11/30/23 0333 12/01/23 0217 12/02/23 0431  NA 145 149* 147* 147* 144 146*  K 4.6 4.4 4.0 4.0 3.8 3.9  CL 112* 114* 113* 113* 109 110  CO2 22 24 24 25 24 23   GLUCOSE 136* 155* 142* 116* 127* 87  BUN 108* 98* 75* 64* 61* 54*  CREATININE 0.93 0.96 0.92 0.78 0.67 0.62  CALCIUM  8.5* 8.4* 8.3* 8.1* 8.2* 8.6*  MG 2.7*  --   --   --   --   --    Liver Function Tests: Recent Labs  Lab 11/26/23 0243 11/27/23 0344  AST 25 24  ALT 19 19  ALKPHOS 108 108  BILITOT 0.4 0.4  PROT 6.7 6.9  ALBUMIN 1.9* 2.0*   No results for input(s): LIPASE, AMYLASE in the last 168 hours. No results for input(s): AMMONIA in the last 168 hours. CBC: Recent Labs  Lab 11/26/23 0243 11/27/23 0344 11/28/23 0830 11/30/23 0333 12/01/23 0217 12/02/23 0431  WBC 10.5 9.4 10.9* 10.3 8.6 9.0  NEUTROABS 5.9 5.2  --   --   --   --   HGB 7.3* 7.6* 8.1* 8.0* 8.1* 8.5*  HCT 24.2* 24.9* 26.3* 25.9* 26.6* 28.5*  MCV 97.6 97.6  98.1 99.6 99.6 100.7*  PLT 389 386 366 322 317 321   Cardiac Enzymes: No results for input(s): CKTOTAL, CKMB, CKMBINDEX, TROPONINI in the last 168 hours. BNP: Invalid input(s): POCBNP CBG: Recent Labs  Lab 12/02/23 0307 12/02/23 0854 12/02/23 0911 12/02/23 1000 12/02/23 1224  GLUCAP 95 72 68* 71 82   D-Dimer No results for input(s): DDIMER in the last 72 hours. Hgb A1c No results for input(s): HGBA1C in the last 72 hours. Lipid Profile No results for input(s): CHOL, HDL, LDLCALC, TRIG, CHOLHDL, LDLDIRECT in the last 72 hours. Thyroid function studies No results for input(s): TSH, T4TOTAL, T3FREE, THYROIDAB in the last 72 hours.  Invalid input(s): FREET3 Anemia work up No results for input(s): VITAMINB12, FOLATE, FERRITIN, TIBC, IRON , RETICCTPCT in the last 72 hours. Urinalysis    Component Value Date/Time   COLORURINE YELLOW 10/23/2023 1800   APPEARANCEUR TURBID (A) 10/23/2023 1800   LABSPEC 1.018 10/23/2023 1800   PHURINE 5.0 10/23/2023 1800   GLUCOSEU 50 (A) 10/23/2023 1800   HGBUR NEGATIVE 10/23/2023 1800   BILIRUBINUR NEGATIVE 10/23/2023 1800   KETONESUR NEGATIVE 10/23/2023 1800   PROTEINUR 30 (A) 10/23/2023 1800   UROBILINOGEN 0.2 02/21/2013 2012   NITRITE NEGATIVE 10/23/2023 1800   LEUKOCYTESUR MODERATE (A) 10/23/2023 1800   Sepsis Labs Recent Labs  Lab 11/28/23 0830 11/30/23 0333 12/01/23 0217 12/02/23 0431  WBC 10.9* 10.3 8.6 9.0   Microbiology Recent Results (from the past 240 hours)  Culture, blood (Routine X 2) w Reflex to ID Panel     Status: None   Collection Time: 11/25/23  1:51 PM   Specimen: BLOOD  Result Value Ref Range Status   Specimen Description BLOOD SITE NOT SPECIFIED  Final   Special Requests   Final    BOTTLES DRAWN AEROBIC ONLY Blood Culture adequate volume   Culture   Final    NO GROWTH 5 DAYS Performed at [facility], [address] [place], [place] [address]     Report Status 11/30/2023 FINAL  Final  Culture, blood (Routine X 2) w Reflex to ID Panel     Status: None   Collection Time: 11/25/23  1:51 PM   Specimen: BLOOD  Result Value Ref Range Status   Specimen Description BLOOD SITE NOT SPECIFIED  Final   Special Requests   Final    BOTTLES DRAWN AEROBIC ONLY Blood Culture adequate volume   Culture   Final    NO GROWTH 5 DAYS Performed at [facility], [address] [place], [place] [address]    Report Status 11/30/2023 FINAL  Final     Time coordinating discharge: Over 30 minutes  SIGNED:   Brayton Lye, MD  [facility] 12/02/2023, 4:02 PM Pager   If 7PM-7AM, please contact night-coverage [email or web address] Password [email or web address]

## 2023-12-02 NOTE — Discharge Instructions (Signed)
[facility]

## 2023-12-02 NOTE — TOC Progression Note (Signed)
 Transition of Care [facility]) - Progression Note    Patient Details  Name: [name] [name] MRN: [id number] Date of Birth: 04-15-1945  [facility] CM/SW Contact  Inocente GORMAN Kindle, LCSW Phone Number: 12/02/2023, 9:43 AM  Clinical Narrative:    CSW continuing to follow.    Expected Discharge Plan: Home w Home Health Services Barriers to Discharge: Continued Medical Work up               Expected Discharge Plan and Services       Living arrangements for the past 2 months: Single Family Home                                       Social Drivers of Health (SDOH) Interventions SDOH Screenings   Food Insecurity: No Food Insecurity (10/24/2023)  Housing: Low Risk  (10/24/2023)  Transportation Needs: No Transportation Needs (10/24/2023)  Utilities: Not At Risk (10/24/2023)  Social Connections: Moderately Integrated (10/24/2023)  Tobacco Use: Medium Risk (10/24/2023)    Readmission Risk Interventions     No data to display
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
## 2023-12-03 ENCOUNTER — Inpatient Hospital Stay
Admission: AD | Admit: 2023-12-03 | Discharge: 2024-01-29 | Disposition: A | Discharge status: Skilled Nursing Facility | Payer: Self-pay | Source: Intra-hospital | Attending: Internal Medicine | Admitting: Internal Medicine

## 2023-12-03 ENCOUNTER — Institutional Professional Consult (permissible substitution) (HOSPITAL_COMMUNITY): Payer: Self-pay

## 2023-12-03 DIAGNOSIS — A419 Sepsis, unspecified organism: Secondary | ICD-10-CM | POA: Diagnosis not present

## 2023-12-03 LAB — GLUCOSE, CAPILLARY
Glucose-Capillary: 115 mg/dL — ABNORMAL HIGH (ref 70–99)
Glucose-Capillary: 154 mg/dL — ABNORMAL HIGH (ref 70–99)
Glucose-Capillary: 84 mg/dL (ref 70–99)
Glucose-Capillary: 96 mg/dL (ref 70–99)

## 2023-12-03 MED ORDER — AMLODIPINE BESYLATE 5 MG PO TABS: 5.0000 mg | ORAL_TABLET | Freq: Every day | ORAL | Status: AC

## 2023-12-03 MED ORDER — LABETALOL HCL 5 MG/ML IV SOLN: 20.0000 mg | INTRAVENOUS | Status: AC | PRN

## 2023-12-03 MED ORDER — AMLODIPINE BESYLATE 5 MG PO TABS
5.0000 mg | ORAL_TABLET | Freq: Every day | ORAL | Status: DC
  Administered 2023-12-03: 5 mg via ORAL
  Filled 2023-12-03: qty 1

## 2023-12-03 NOTE — TOC Transition Note (Signed)
 Transition of Care [facility]) - Discharge Note   Patient Details  Name: [name] [name] MRN: [id number] Date of Birth: 07/04/1945  [facility] CM/SW Contact:  Marval Gell, RN Phone Number: 12/03/2023, 8:40 AM   Clinical Narrative:     Patient accepted to [facility], Room number is 5E07, accepting MD is Dr. Delores. MD to MD report can be called to [phone]. RN to RN report can be called to the same number.      Barriers to Discharge: Continued Medical Work up   Patient Goals and CMS Choice            Discharge Placement                       Discharge Plan and Services Additional resources added to the After Visit Summary for                                       Social Drivers of Health (SDOH) Interventions SDOH Screenings   Food Insecurity: No Food Insecurity (10/24/2023)  Housing: Low Risk  (10/24/2023)  Transportation Needs: No Transportation Needs (10/24/2023)  Utilities: Not At Risk (10/24/2023)  Social Connections: Moderately Integrated (10/24/2023)  Tobacco Use: Medium Risk (10/24/2023)     Readmission Risk Interventions     No data to display

## 2023-12-03 NOTE — Unmapped External Note (Addendum)
 RT Initial Assessment  Admitting Diagnosis: Recurrent aspiration pneumonia  Admitting Diagnosis Reviewed: I have reviewed the patient's admitting diagnosis and problem list.  Past Medical History: No past medical history on file.  Past Medical History Reviewed: I have reviewed the patient's past medical history.  ABG:    No results found for: PHART, PCO2ART, PO2ART, HCO3ART, BEART, O2SATART  Most Recent ABG Reviewed: Not available  Active Medications Reviewed: Not available  Most Recent CXR Reviewed: no recent    RT Initial Assessment     Row Name 12/03/23 1314     Physical Assessment   Most Recent CXR CXR not available   Mallampati Score II   R Breath Sounds Clear;Diminished   L Breath Sounds Clear;Diminished   Chest Excursion L equal to R   Breathing Pattern Eupnea   Cough Strong;Non-productive   Secretions/Sputum Amount None   Pulse 110   RR 22   $ Respiratory Assessment Complete           RT Plan of Care     Row Name 12/03/23 1309     Respiratory Plan of Care   Home O2, Prior to Admission No   Therapy Recommendations -- * room air   Short Term Goals Other (comment) * maintain stable respiratory status   Long Term Goals Return to Previous Home DME Requirements           GARMON, CHRISTINE, RT 12/03/23 1:17 PM EST *Some images could not be shown.

## 2023-12-03 NOTE — Plan of Care (Signed)
  Problem: Education: Goal: Ability to describe self-care measures that may prevent or decrease complications (Diabetes Survival Skills Education) will improve Outcome: Progressing   Problem: Coping: Goal: Ability to adjust to condition or change in health will improve Outcome: Progressing   Problem: Fluid Volume: Goal: Ability to maintain a balanced intake and output will improve Outcome: Progressing   Problem: Health Behavior/Discharge Planning: Goal: Ability to identify and utilize available resources and services will improve Outcome: Progressing   Problem: Metabolic: Goal: Ability to maintain appropriate glucose levels will improve Outcome: Progressing   Problem: Nutritional: Goal: Maintenance of adequate nutrition will improve Outcome: Progressing   Problem: Skin Integrity: Goal: Risk for impaired skin integrity will decrease Outcome: Progressing   Problem: Education: Goal: Knowledge of General Education information will improve Description: Including pain rating scale, medication(s)/side effects and non-pharmacologic comfort measures Outcome: Progressing

## 2023-12-03 NOTE — Discharge Summary (Signed)
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 Oral Oral Axillary Axillary  SpO2: 100% 99%    Weight:      Height:        General: Pt is alert, awake, pleasant, actually he is oriented to time 3 this morning, extremely frail, deconditioned Cardiovascular: RRR Respiratory: Good air entry bilaterally Abdominal: Abdomen soft, shingles lesions in his abdomen has dried out and crusted Extremities: left upper extremity contracted, 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     Microbiology: Recent Results (from the past 240 hours)  Culture, blood (Routine X 2) w Reflex to ID Panel     Status: None   Collection Time: 11/25/23  1:51 PM   Specimen: BLOOD  Result Value Ref Range Status   Specimen Description BLOOD SITE NOT SPECIFIED  Final   Special Requests   Final    BOTTLES DRAWN  AEROBIC ONLY Blood Culture adequate volume   Culture   Final  NO GROWTH 5 DAYS Performed at [facility], [address], [place], [place] [address]    Report Status 11/30/2023 FINAL  Final  Culture, blood (Routine X 2) w Reflex to ID Panel     Status: None   Collection Time: 11/25/23  1:51 PM   Specimen: BLOOD  Result Value Ref Range Status   Specimen Description BLOOD SITE NOT SPECIFIED  Final   Special Requests   Final    BOTTLES DRAWN AEROBIC ONLY Blood Culture adequate volume   Culture   Final    NO GROWTH 5 DAYS Performed at [facility], [address], [place], [place] [address]    Report Status 11/30/2023 FINAL  Final     Labs: BNP (last 3 results) No results for input(s): BNP in the last 8760 hours. Basic Metabolic Panel: Recent Labs  Lab 11/27/23 0344 11/28/23 0830 11/30/23 0333 12/01/23 0217 12/02/23 0431  NA 149* 147* 147* 144 146*  K 4.4 4.0 4.0 3.8 3.9  CL 114* 113* 113* 109 110  CO2 24 24 25 24 23   GLUCOSE 155* 142* 116* 127* 87  BUN 98* 75* 64* 61* 54*  CREATININE 0.96 0.92 0.78 0.67 0.62  CALCIUM  8.4* 8.3* 8.1* 8.2* 8.6*   Liver Function Tests: Recent Labs  Lab 11/27/23 0344  AST 24  ALT 19  ALKPHOS 108  BILITOT 0.4  PROT 6.9  ALBUMIN 2.0*   No results for input(s): LIPASE, AMYLASE in the last 168 hours. No results for input(s): AMMONIA in the last 168 hours. CBC: Recent Labs  Lab 11/27/23 0344 11/28/23 0830 11/30/23 0333 12/01/23 0217 12/02/23 0431  WBC 9.4 10.9* 10.3 8.6 9.0  NEUTROABS 5.2  --   --   --   --   HGB 7.6* 8.1* 8.0* 8.1* 8.5*  HCT 24.9* 26.3* 25.9* 26.6* 28.5*  MCV 97.6 98.1 99.6 99.6 100.7*  PLT 386 366 322 317 321   Cardiac Enzymes: No results for input(s): CKTOTAL, CKMB, CKMBINDEX, TROPONINI in the last 168 hours. BNP: Invalid input(s): POCBNP CBG: Recent Labs  Lab 12/02/23 1604 12/02/23 1944 12/03/23 0039 12/03/23 0333 12/03/23 0759  GLUCAP 109* 137* 96 84 115*    D-Dimer No results for input(s): DDIMER in the last 72 hours. Hgb A1c No results for input(s): HGBA1C in the last 72 hours. Lipid Profile No results for input(s): CHOL, HDL, LDLCALC, TRIG, CHOLHDL, LDLDIRECT in the last 72 hours. Thyroid function studies No results for input(s): TSH, T4TOTAL, T3FREE, THYROIDAB in the last 72 hours.  Invalid input(s): FREET3 Anemia work up No results for input(s): VITAMINB12, FOLATE, FERRITIN, TIBC, IRON , RETICCTPCT in the last 72 hours. Urinalysis    Component Value Date/Time   COLORURINE YELLOW 10/23/2023 1800   APPEARANCEUR TURBID (A) 10/23/2023 1800   LABSPEC 1.018 10/23/2023 1800   PHURINE 5.0 10/23/2023 1800   GLUCOSEU 50 (A) 10/23/2023 1800   HGBUR NEGATIVE 10/23/2023 1800   BILIRUBINUR NEGATIVE 10/23/2023 1800   KETONESUR NEGATIVE 10/23/2023 1800   PROTEINUR 30 (A) 10/23/2023 1800   UROBILINOGEN 0.2 02/21/2013 2012   NITRITE NEGATIVE 10/23/2023 1800   LEUKOCYTESUR MODERATE (A) 10/23/2023 1800   Sepsis Labs Recent Labs  Lab 11/28/23 0830 11/30/23 0333 12/01/23 0217 12/02/23 0431  WBC 10.9* 10.3 8.6 9.0   Microbiology Recent Results (from the past 240 hours)  Culture, blood (Routine X 2) w Reflex to ID Panel     Status: None   Collection Time: 11/25/23  1:51 PM   Specimen:  BLOOD  Result Value Ref Range Status   Specimen Description BLOOD SITE NOT SPECIFIED  Final   Special Requests   Final    BOTTLES DRAWN AEROBIC ONLY Blood Culture adequate volume   Culture   Final    NO GROWTH 5 DAYS Performed at [facility], [address], [place], [place] [address]    Report Status 11/30/2023 FINAL  Final  Culture, blood (Routine X 2) w Reflex to ID Panel     Status: None   Collection Time: 11/25/23  1:51 PM   Specimen: BLOOD  Result Value Ref Range Status   Specimen Description BLOOD SITE NOT SPECIFIED  Final   Special Requests   Final    BOTTLES DRAWN AEROBIC ONLY Blood Culture  adequate volume   Culture   Final    NO GROWTH 5 DAYS Performed at [facility], [address], [place], [place] [address]    Report Status 11/30/2023 FINAL  Final     Time coordinating discharge: Over 30 minutes  SIGNED:   Brayton Lye, MD  [facility] 12/03/2023, 8:11 AM Pager   If 7PM-7AM, please contact night-coverage [email or web address]

## 2023-12-03 NOTE — Progress Notes (Signed)
 Patient refused any turning and wound dressing change. Given his mentation, after few hours tried again to do dressing change but patient is resisting, becoming more anxious and says Leave me alone! Go! Go!

## 2023-12-03 NOTE — Plan of Care (Signed)
  Problem: Tissue Perfusion: Goal: Adequacy of tissue perfusion will improve Outcome: Progressing   Problem: Coping: Goal: Level of anxiety will decrease Outcome: Progressing   Problem: Pain Managment: Goal: General experience of comfort will improve and/or be controlled Outcome: Progressing

## 2023-12-03 NOTE — Progress Notes (Signed)
 RN attempted to call report, RN receiving pt. On lunch break, call back  numbers provided.

## 2023-12-04 LAB — CBC
HCT: 29 % — ABNORMAL LOW (ref 39.0–52.0)
Hemoglobin: 8.9 g/dL — ABNORMAL LOW (ref 13.0–17.0)
MCH: 30.6 pg (ref 26.0–34.0)
MCHC: 30.7 g/dL (ref 30.0–36.0)
MCV: 99.7 fL (ref 80.0–100.0)
Platelets: 297 K/uL (ref 150–400)
RBC: 2.91 MIL/uL — ABNORMAL LOW (ref 4.22–5.81)
RDW: 23.3 % — ABNORMAL HIGH (ref 11.5–15.5)
WBC: 9.3 K/uL (ref 4.0–10.5)
nRBC: 0.2 % (ref 0.0–0.2)

## 2023-12-04 LAB — COMPREHENSIVE METABOLIC PANEL WITH GFR
ALT: 19 U/L (ref 0–44)
AST: 24 U/L (ref 15–41)
Albumin: 2.3 g/dL — ABNORMAL LOW (ref 3.5–5.0)
Alkaline Phosphatase: 99 U/L (ref 38–126)
Anion gap: 13 (ref 5–15)
BUN: 45 mg/dL — ABNORMAL HIGH (ref 8–23)
CO2: 21 mmol/L — ABNORMAL LOW (ref 22–32)
Calcium: 8.3 mg/dL — ABNORMAL LOW (ref 8.9–10.3)
Chloride: 106 mmol/L (ref 98–111)
Creatinine, Ser: 0.71 mg/dL (ref 0.61–1.24)
GFR, Estimated: >60 mL/min (ref >60)
Glucose, Bld: 129 mg/dL — ABNORMAL HIGH (ref 70–99)
Potassium: 5 mmol/L (ref 3.5–5.1)
Sodium: 140 mmol/L (ref 135–145)
Total Bilirubin: 0.6 mg/dL (ref 0.0–1.2)
Total Protein: 7 g/dL (ref 6.5–8.1)
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
## 2023-12-08 LAB — COMPREHENSIVE METABOLIC PANEL WITH GFR
ALT: 18 U/L (ref 0–44)
AST: 24 U/L (ref 15–41)
Albumin: 2.3 g/dL — ABNORMAL LOW (ref 3.5–5.0)
Alkaline Phosphatase: 122 U/L (ref 38–126)
Anion gap: 12 (ref 5–15)
BUN: 65 mg/dL — ABNORMAL HIGH (ref 8–23)
CO2: 19 mmol/L — ABNORMAL LOW (ref 22–32)
Calcium: 8.5 mg/dL — ABNORMAL LOW (ref 8.9–10.3)
Chloride: 104 mmol/L (ref 98–111)
Creatinine, Ser: 0.73 mg/dL (ref 0.61–1.24)
GFR, Estimated: >60 mL/min (ref >60)
Glucose, Bld: 148 mg/dL — ABNORMAL HIGH (ref 70–99)
Potassium: 6.1 mmol/L — ABNORMAL HIGH (ref 3.5–5.1)
Sodium: 135 mmol/L (ref 135–145)
Total Bilirubin: 0.5 mg/dL (ref 0.0–1.2)
Total Protein: 6.5 g/dL (ref 6.5–8.1)

## 2023-12-08 LAB — CBC WITH DIFFERENTIAL/PLATELET
Abs Immature Granulocytes: 0.02 K/uL (ref 0.00–0.07)
Basophils Absolute: 0 K/uL (ref 0.0–0.1)
Basophils Relative: 0 %
Eosinophils Absolute: 0.3 K/uL (ref 0.0–0.5)
Eosinophils Relative: 4 %
HCT: 27.3 % — ABNORMAL LOW (ref 39.0–52.0)
Hemoglobin: 8.5 g/dL — ABNORMAL LOW (ref 13.0–17.0)
Immature Granulocytes: 0 %
Lymphocytes Relative: 27 %
Lymphs Abs: 2 K/uL (ref 0.7–4.0)
MCH: 30.9 pg (ref 26.0–34.0)
MCHC: 31.1 g/dL (ref 30.0–36.0)
MCV: 99.3 fL (ref 80.0–100.0)
Monocytes Absolute: 0.9 K/uL (ref 0.1–1.0)
Monocytes Relative: 12 %
Neutro Abs: 4.3 K/uL (ref 1.7–7.7)
Neutrophils Relative %: 57 %
Platelets: 290 K/uL (ref 150–400)
RBC: 2.75 MIL/uL — ABNORMAL LOW (ref 4.22–5.81)
RDW: 22.8 % — ABNORMAL HIGH (ref 11.5–15.5)
Smear Review: NORMAL
WBC: 7.5 K/uL (ref 4.0–10.5)
nRBC: 0 % (ref 0.0–0.2)

## 2023-12-08 LAB — POTASSIUM: Potassium: 4.6 mmol/L (ref 3.5–5.1)

## 2023-12-08 NOTE — Nursing Note (Addendum)
 Pt educated on outcomes of refusing insulin ; pt states No, I don't want it. Pt educated on importance of being compliant with care and scheduled lab work-pt is still refusing bloodwork this AM.
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
## 2023-12-09 LAB — BASIC METABOLIC PANEL WITH GFR
Anion gap: 10 (ref 5–15)
BUN: 63 mg/dL — ABNORMAL HIGH (ref 8–23)
CO2: 26 mmol/L (ref 22–32)
Calcium: 8.3 mg/dL — ABNORMAL LOW (ref 8.9–10.3)
Chloride: 100 mmol/L (ref 98–111)
Creatinine, Ser: 0.68 mg/dL (ref 0.61–1.24)
GFR, Estimated: >60 mL/min (ref >60)
Glucose, Bld: 150 mg/dL — ABNORMAL HIGH (ref 70–99)
Potassium: 4.6 mmol/L (ref 3.5–5.1)
Sodium: 136 mmol/L (ref 135–145)
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
## 2023-12-11 ENCOUNTER — Institutional Professional Consult (permissible substitution) (HOSPITAL_COMMUNITY)

## 2023-12-11 HISTORY — PX: IR REMOVAL TUN CV CATH W/O FL: IMG2289

## 2023-12-13 LAB — BASIC METABOLIC PANEL WITH GFR
Anion gap: 11 (ref 5–15)
BUN: 50 mg/dL — ABNORMAL HIGH (ref 8–23)
CO2: 24 mmol/L (ref 22–32)
Calcium: 8.7 mg/dL — ABNORMAL LOW (ref 8.9–10.3)
Chloride: 100 mmol/L (ref 98–111)
Creatinine, Ser: 0.81 mg/dL (ref 0.61–1.24)
GFR, Estimated: >60 mL/min (ref >60)
Glucose, Bld: 137 mg/dL — ABNORMAL HIGH (ref 70–99)
Potassium: 4.6 mmol/L (ref 3.5–5.1)
Sodium: 135 mmol/L (ref 135–145)

## 2023-12-15 LAB — BASIC METABOLIC PANEL WITH GFR
Anion gap: 16 — ABNORMAL HIGH (ref 5–15)
BUN: 65 mg/dL — ABNORMAL HIGH (ref 8–23)
CO2: 20 mmol/L — ABNORMAL LOW (ref 22–32)
Calcium: 8.6 mg/dL — ABNORMAL LOW (ref 8.9–10.3)
Chloride: 99 mmol/L (ref 98–111)
Creatinine, Ser: 0.76 mg/dL (ref 0.61–1.24)
GFR, Estimated: >60 mL/min (ref >60)
Glucose, Bld: 144 mg/dL — ABNORMAL HIGH (ref 70–99)
Potassium: 5.5 mmol/L — ABNORMAL HIGH (ref 3.5–5.1)
Sodium: 135 mmol/L (ref 135–145)

## 2023-12-15 LAB — POTASSIUM: Potassium: 5.9 mmol/L — ABNORMAL HIGH (ref 3.5–5.1)

## 2023-12-16 LAB — BASIC METABOLIC PANEL WITH GFR
Anion gap: 10 (ref 5–15)
BUN: 82 mg/dL — ABNORMAL HIGH (ref 8–23)
CO2: 25 mmol/L (ref 22–32)
Calcium: 8.5 mg/dL — ABNORMAL LOW (ref 8.9–10.3)
Chloride: 101 mmol/L (ref 98–111)
Creatinine, Ser: 0.9 mg/dL (ref 0.61–1.24)
GFR, Estimated: >60 mL/min (ref >60)
Glucose, Bld: 109 mg/dL — ABNORMAL HIGH (ref 70–99)
Potassium: 4.4 mmol/L (ref 3.5–5.1)
Sodium: 136 mmol/L (ref 135–145)
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
## 2023-12-17 NOTE — Progress Notes (Signed)
 Procedure IR Request - Gastrostomy tube Placement  Case reviewed with IR Attending Dr. CHRISTELLA. Frederic Specking. After review of the imaging the the stomach is located very high and  under anterior lower chest and high colon. Patient's anatomy is not amenable to percutaneous gastrotomy tube placement.   This was communicated directly to Dr. Frutoso in [facility].

## 2023-12-21 LAB — CBC
HCT: 28.2 % — ABNORMAL LOW (ref 39.0–52.0)
Hemoglobin: 9 g/dL — ABNORMAL LOW (ref 13.0–17.0)
MCH: 31.6 pg (ref 26.0–34.0)
MCHC: 31.9 g/dL (ref 30.0–36.0)
MCV: 98.9 fL (ref 80.0–100.0)
Platelets: 349 K/uL (ref 150–400)
RBC: 2.85 MIL/uL — ABNORMAL LOW (ref 4.22–5.81)
RDW: 19.8 % — ABNORMAL HIGH (ref 11.5–15.5)
WBC: 6.4 K/uL (ref 4.0–10.5)
nRBC: 0 % (ref 0.0–0.2)

## 2023-12-21 LAB — BASIC METABOLIC PANEL WITH GFR
Anion gap: 7 (ref 5–15)
BUN: 60 mg/dL — ABNORMAL HIGH (ref 8–23)
CO2: 29 mmol/L (ref 22–32)
Calcium: 8.8 mg/dL — ABNORMAL LOW (ref 8.9–10.3)
Chloride: 99 mmol/L (ref 98–111)
Creatinine, Ser: 0.84 mg/dL (ref 0.61–1.24)
GFR, Estimated: >60 mL/min (ref >60)
Glucose, Bld: 117 mg/dL — ABNORMAL HIGH (ref 70–99)
Potassium: 4.6 mmol/L (ref 3.5–5.1)
Sodium: 135 mmol/L (ref 135–145)

## 2023-12-21 LAB — MAGNESIUM: Magnesium: 2.4 mg/dL (ref 1.7–2.4)

## 2023-12-24 LAB — CBC
HCT: 27.4 % — ABNORMAL LOW (ref 39.0–52.0)
Hemoglobin: 8.7 g/dL — ABNORMAL LOW (ref 13.0–17.0)
MCH: 31.8 pg (ref 26.0–34.0)
MCHC: 31.8 g/dL (ref 30.0–36.0)
MCV: 100 fL (ref 80.0–100.0)
Platelets: 302 K/uL (ref 150–400)
RBC: 2.74 MIL/uL — ABNORMAL LOW (ref 4.22–5.81)
RDW: 19.2 % — ABNORMAL HIGH (ref 11.5–15.5)
WBC: 8 K/uL (ref 4.0–10.5)
nRBC: 0 % (ref 0.0–0.2)

## 2023-12-24 LAB — BASIC METABOLIC PANEL WITH GFR
Anion gap: 16 — ABNORMAL HIGH (ref 5–15)
BUN: 55 mg/dL — ABNORMAL HIGH (ref 8–23)
CO2: 21 mmol/L — ABNORMAL LOW (ref 22–32)
Calcium: 8.5 mg/dL — ABNORMAL LOW (ref 8.9–10.3)
Chloride: 102 mmol/L (ref 98–111)
Creatinine, Ser: 0.8 mg/dL (ref 0.61–1.24)
GFR, Estimated: >60 mL/min (ref >60)
Glucose, Bld: 107 mg/dL — ABNORMAL HIGH (ref 70–99)
Potassium: 3.9 mmol/L (ref 3.5–5.1)
Sodium: 139 mmol/L (ref 135–145)

## 2023-12-24 LAB — MAGNESIUM: Magnesium: 2.4 mg/dL (ref 1.7–2.4)

## 2023-12-24 NOTE — Progress Notes (Signed)
 Request to IR for G tube placement.  Patient anatomy previously reviewed by IR on 12/17/23 and found to have colon overlying stomach with very high riding stomach which does not allow for percutaneous placement. Select attending MD was made aware of this that same day.  Discussed with select RN that IR is unable to place a G tube in this patient due to the above and recommend surgical consultation.  Order will be deleted. Please call with questions or concerns.  Clotilda Hesselbach, PA-C
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
## 2023-12-31 LAB — CBC WITH DIFFERENTIAL/PLATELET
Abs Immature Granulocytes: 0.02 K/uL (ref 0.00–0.07)
Basophils Absolute: 0 K/uL (ref 0.0–0.1)
Basophils Relative: 0 %
Eosinophils Absolute: 0.3 K/uL (ref 0.0–0.5)
Eosinophils Relative: 5 %
HCT: 24.3 % — ABNORMAL LOW (ref 39.0–52.0)
Hemoglobin: 7.6 g/dL — ABNORMAL LOW (ref 13.0–17.0)
Immature Granulocytes: 0 %
Lymphocytes Relative: 26 %
Lymphs Abs: 1.8 K/uL (ref 0.7–4.0)
MCH: 32.1 pg (ref 26.0–34.0)
MCHC: 31.3 g/dL (ref 30.0–36.0)
MCV: 102.5 fL — ABNORMAL HIGH (ref 80.0–100.0)
Monocytes Absolute: 0.9 K/uL (ref 0.1–1.0)
Monocytes Relative: 13 %
Neutro Abs: 3.8 K/uL (ref 1.7–7.7)
Neutrophils Relative %: 56 %
Platelets: 289 K/uL (ref 150–400)
RBC: 2.37 MIL/uL — ABNORMAL LOW (ref 4.22–5.81)
RDW: 18.5 % — ABNORMAL HIGH (ref 11.5–15.5)
WBC: 6.9 K/uL (ref 4.0–10.5)
nRBC: 0 % (ref 0.0–0.2)

## 2023-12-31 LAB — COMPREHENSIVE METABOLIC PANEL WITH GFR
ALT: 94 U/L — ABNORMAL HIGH (ref 0–44)
AST: 79 U/L — ABNORMAL HIGH (ref 15–41)
Albumin: 1.9 g/dL — ABNORMAL LOW (ref 3.5–5.0)
Alkaline Phosphatase: 154 U/L — ABNORMAL HIGH (ref 38–126)
Anion gap: 6 (ref 5–15)
BUN: 59 mg/dL — ABNORMAL HIGH (ref 8–23)
CO2: 31 mmol/L (ref 22–32)
Calcium: 8.1 mg/dL — ABNORMAL LOW (ref 8.9–10.3)
Chloride: 102 mmol/L (ref 98–111)
Creatinine, Ser: 0.78 mg/dL (ref 0.61–1.24)
GFR, Estimated: >60 mL/min (ref >60)
Glucose, Bld: 116 mg/dL — ABNORMAL HIGH (ref 70–99)
Potassium: 4.8 mmol/L (ref 3.5–5.1)
Sodium: 139 mmol/L (ref 135–145)
Total Bilirubin: 0.8 mg/dL (ref 0.0–1.2)
Total Protein: 6.4 g/dL — ABNORMAL LOW (ref 6.5–8.1)

## 2024-01-03 LAB — BASIC METABOLIC PANEL WITH GFR
Anion gap: 9 (ref 5–15)
BUN: 48 mg/dL — ABNORMAL HIGH (ref 8–23)
CO2: 28 mmol/L (ref 22–32)
Calcium: 8.9 mg/dL (ref 8.9–10.3)
Chloride: 103 mmol/L (ref 98–111)
Creatinine, Ser: 0.7 mg/dL (ref 0.61–1.24)
GFR, Estimated: >60 mL/min
Glucose, Bld: 130 mg/dL — ABNORMAL HIGH (ref 70–99)
Potassium: 4.7 mmol/L (ref 3.5–5.1)
Sodium: 140 mmol/L (ref 135–145)

## 2024-01-03 LAB — CBC
HCT: 24.4 % — ABNORMAL LOW (ref 39.0–52.0)
Hemoglobin: 7.7 g/dL — ABNORMAL LOW (ref 13.0–17.0)
MCH: 32.4 pg (ref 26.0–34.0)
MCHC: 31.6 g/dL (ref 30.0–36.0)
MCV: 102.5 fL — ABNORMAL HIGH (ref 80.0–100.0)
Platelets: 317 K/uL (ref 150–400)
RBC: 2.38 MIL/uL — ABNORMAL LOW (ref 4.22–5.81)
RDW: 18.1 % — ABNORMAL HIGH (ref 11.5–15.5)
WBC: 6.9 K/uL (ref 4.0–10.5)
nRBC: 0 % (ref 0.0–0.2)

## 2024-01-03 LAB — MAGNESIUM: Magnesium: 2.4 mg/dL (ref 1.7–2.4)

## 2024-01-04 ENCOUNTER — Other Ambulatory Visit (HOSPITAL_COMMUNITY)
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
## 2024-01-05 NOTE — Unmapped External Note (Signed)
" °  Problem: Potential for Compromised Skin Integrity Goal: Skin integrity is maintained or improved Outcome: Progressing Flowsheets (Taken 01/02/2024 1608 by Bartholome EMERSON Borg, RN) Skin Integrity is Maintained or Improved:  Turn Patient  Keep skin clean and dry  Monitor and teach appropriate hygiene practices  Assess skin and skin risk for breakdown Goal: Nutritional status is improving Outcome: Progressing   "

## 2024-01-07 NOTE — Unmapped External Note (Signed)
 Wound Progress Note  Reason for wound Consult: Weekly Wound Assessment  Patient is awake, alert, and oriented  [name] [name] is a 78 y.o. male with the following Problems.  Problem List[1]  Past Medical History:   Past Medical History:  Diagnosis Date   Diabetes mellitus    Essential hypertension    Hyperlipidemia    Injury of neck    Iron  deficiency anemia     Past Surgical History:   Past Surgical History:  Procedure Laterality Date   INCISION AND DRAINAGE OF WOUND Right 2022    Allergies:  Baclofen and Lisinopril   Braden Score:  Braden Scale Score: 13  Wound/Ulcer Assessment:  Wound Pressure Injury Sacrum (Active)  Date First Assessed/Time First Assessed: 12/03/23 1342   Wound Type - REQUIRED: Pressure Injury  Pre-Existing Wound: Yes  Anatomical Site: Sacrum    Assessments 01/07/2024 10:04 AM  Wound Image    Stage Stage 4  Wound Length (cm) 7.2 cm  Wound Width (cm) 9.5 cm  Wound Depth (cm) 0.8  Calculated Wound Size (cm^2) 68.4 cm^2  Calculated Wound Size (cm^3) 54.72 cm^3  Change in Wound Size %  72.51  Extent of Tissue Loss Full thickness tissue loss  Undermining Undermining < 2 cm in any area  Undermining - Location and Depth 2.1cm at 7 o'clock  Granulation Tissue Bright, beefy red. <75% and >25% of wound filled  Epithelialization 0 to <25% wound covered  Necrotic Tissue Type None visible  Necrotic Tissue Amount None visible  Wound Edges Well-defined, not attached to wound base  Periwound Skin Color Pink or normal for ethnic group  Periwound Skin Edema No swelling or edema  Periwound Skin Induration None present  Exudate Type Serosanguineous: thin watery, pale re/pink  Exudate Amount Small  Odor None  Wound Management Cleansed;Barrier Film;Foam;Gauze;Other (Comment) (vashe)  Dressing Changed Changed  Dressing Status Clean;Dry;Intact     Active Orders  Date Order Order Summary Order Comments Authorizing Provider  12/31/23 1245 Wound  Management: Use Associated Wounds location (sacral and left gluteal); Wound care per specified algorithm/order; Kathrine; Normal Saline; Prep with skin prep and allow to dry; Other (lightly pack with moistened 4x4); Adhesive foam; Dressing no lon... Routine, Daily, First occurrence on Tue 12/31/23 at 1000, Until Specified Wound Pressure Injury Sacrum  Wound Location: Use Associated Wounds location / sacral and left gluteal Wound Management: Wound care per specified algorithm/order Type: Wet Cleanse: Normal Saline Prep: Prep with skin prep and allow to dry Fill/Apply: Other / lightly pack with moistened 4x4 Cover: Adhesive foam Change/PRN: Dressing no longer intact (i.e. lifting, leaking, damaged), Dressing damp, moist or saturated - Selinda JINNY Fleeta Valeria, MD  12/03/23 1408 Wound Management: Use Associated Wounds location; Wound care per algorithm Routine, Once, On Tue 12/03/23 at 1404, For 1 occurrence Wound Pressure Injury Sacrum  Wound Pressure Injury Gluteal/Gluteus Left  Wound Pressure Injury Ischial Tuberosity/Ischium Right  Wound Pressure Injury Heel/Calcaneus Right  Wound Pressure Injury Heel/Calcaneus Left  Wound Vascular Wound Arterial/ischemic Foot Left;Plantar  Wound Vascular Wound Arterial/ischemic Toe # Left;1;2;3;4;5  Wound Vascular Wound Arterial/ischemic Toe # Right;1;2;3;4;5  Wound Wound/Other Other (Comment) Leg Right;Lower  Wound Wound/Other Other (Comment) Leg Left;Lower  Wound Vascular Wound Arterial/ischemic Arm Right  Wound Pressure Injury Elbow Right  Initiate wound care protocols Wound Location: Use Associated Wounds location Wound Management: Wound care per algorithm Initiate wound care protocols Corean JONETTA Daring, MD     Wound Pressure Injury Gluteal/Gluteus Left (Active)  Date First Assessed/Time First Assessed: 12/03/23  1342   Wound Type - REQUIRED: Pressure Injury  Pre-Existing Wound: Yes  Anatomical Site: Gluteal/Gluteus  Wound Location Orientation: Left     Assessments 01/07/2024 10:04 AM  Wound Image    Stage Stage 4  Wound Length (cm) 0 cm  Wound Width (cm) 0 cm  Wound Depth (cm) 0  Calculated Wound Size (cm^2) 0 cm^2  Calculated Wound Size (cm^3) 0 cm^3  Change in Wound Size %  100  Epithelialization 100% wound covered, surface intact  Exudate Type None  Exudate Amount None, dry wound     Active Orders  Date Order Order Summary Order Comments Authorizing Provider  12/03/23 1408 Wound Management: Use Associated Wounds location; Wound care per algorithm Routine, Once, On Tue 12/03/23 at 1404, For 1 occurrence Wound Pressure Injury Sacrum  Wound Pressure Injury Gluteal/Gluteus Left  Wound Pressure Injury Ischial Tuberosity/Ischium Right  Wound Pressure Injury Heel/Calcaneus Right  Wound Pressure Injury Heel/Calcaneus Left  Wound Vascular Wound Arterial/ischemic Foot Left;Plantar  Wound Vascular Wound Arterial/ischemic Toe # Left;1;2;3;4;5  Wound Vascular Wound Arterial/ischemic Toe # Right;1;2;3;4;5  Wound Wound/Other Other (Comment) Leg Right;Lower  Wound Wound/Other Other (Comment) Leg Left;Lower  Wound Vascular Wound Arterial/ischemic Arm Right  Wound Pressure Injury Elbow Right  Initiate wound care protocols Wound Location: Use Associated Wounds location Wound Management: Wound care per algorithm Initiate wound care protocols Corean JONETTA Daring, MD     Wound Vascular Wound Arterial/ischemic Foot Left;Plantar (Active)  Date First Assessed/Time First Assessed: 12/03/23 1343   Wound Type - REQUIRED: Vascular Wound  Pre-Existing Wound: Yes  Vascular Wound Type: Arterial/ischemic  Anatomical Site: Foot  Wound Location Orientation: Left;Plantar    Assessments 01/07/2024 10:04 AM  Wound Image    Wound Length (cm) 5 cm  Wound Width (cm) 3 cm  Wound Depth (cm) 0  Calculated Wound Size (cm^2) 15 cm^2  Calculated Wound Size (cm^3) 0 cm^3  Extent of Tissue Loss Obscured by necrosis  Undermining None present  Granulation Tissue No  granulation tissue present  Epithelialization 0 to <25% wound covered  Necrotic Tissue Type Eschar: Firmly adherent, hard, black eschar  Necrotic Tissue Amount 75% to 100% of wound covered  Wound Edges Indistinct, diffuse, none clearly visible  Periwound Skin Color Pink or normal for ethnic group  Periwound Skin Edema No swelling or edema  Periwound Skin Induration None present  Exudate Type None  Exudate Amount None, dry wound  Odor None  Wound Management Cleansed;Barrier Film;Foam;Gauze;Alginate  Dressing Changed Changed  Dressing Status Clean;Dry;Intact     Active Orders  Date Order Order Summary Order Comments Authorizing Provider  12/31/23 1246 Wound Management: Use Associated Wounds location (left plantar foot); Wound care per specified algorithm/order; Kathrine; Normal Saline; Prep with skin prep and allow to dry; Alginate silver ; Adhesive foam; Dressing no longer intact (i.e. lifting, leak... Routine, Weekly, First occurrence on Tue 12/31/23 at 1000, Until Specified Wound Vascular Wound Arterial/ischemic Foot Left;Plantar  Wound Location: Use Associated Wounds location / left plantar foot Wound Management: Wound care per specified algorithm/order Type: Wet Cleanse: Normal Saline Prep: Prep with skin prep and allow to dry Fill/Apply: Alginate silver  Cover: Adhesive foam Change/PRN: Dressing no longer intact (i.e. lifting, leaking, damaged), Dressing damp, moist or saturated - Selinda JINNY Fleeta Valeria, MD  12/03/23 1408 Wound Management: Use Associated Wounds location; Wound care per algorithm Routine, Once, On Tue 12/03/23 at 1404, For 1 occurrence Wound Pressure Injury Sacrum  Wound Pressure Injury Gluteal/Gluteus Left  Wound Pressure Injury Ischial Tuberosity/Ischium Right  Wound Pressure Injury Heel/Calcaneus Right  Wound Pressure Injury Heel/Calcaneus Left  Wound Vascular Wound Arterial/ischemic Foot Left;Plantar  Wound Vascular Wound Arterial/ischemic Toe # Left;1;2;3;4;5  Wound  Vascular Wound Arterial/ischemic Toe # Right;1;2;3;4;5  Wound Wound/Other Other (Comment) Leg Right;Lower  Wound Wound/Other Other (Comment) Leg Left;Lower  Wound Vascular Wound Arterial/ischemic Arm Right  Wound Pressure Injury Elbow Right  Initiate wound care protocols Wound Location: Use Associated Wounds location Wound Management: Wound care per algorithm Initiate wound care protocols Corean JONETTA Daring, MD     Wound Wound/Other Other (Comment) Leg Right;Lower (Active)  Date First Assessed/Time First Assessed: 12/03/23 1345   Wound Type - REQUIRED: Wound/Other  Pre-Existing Wound: Yes  Wound Type: (c) Other (Comment)  Anatomical Site: Leg  Wound Location Orientation: Right;Lower    Assessments 01/07/2024 10:04 AM  Wound Image    Wound Image - Comment dry scaling/peeling skin  Wound Length (cm) 25 cm  Wound Width (cm) 15 cm  Wound Depth (cm) 0  Calculated Wound Size (cm^2) 375 cm^2  Calculated Wound Size (cm^3) 0 cm^3  Undermining None present  Granulation Tissue No granulation tissue present  Epithelialization 0 to <25% wound covered  Necrotic Tissue Type None visible  Necrotic Tissue Amount None visible  Wound Edges Indistinct, diffuse, none clearly visible  Periwound Skin Color Pink or normal for ethnic group  Periwound Skin Edema No swelling or edema  Periwound Skin Induration None present  Exudate Type None  Exudate Amount None, dry wound  Odor None  Wound Management Cleansed;Barrier Film;Abdominal dressing;Roll gauze;Other (Comment) (vashe)  Dressing Changed Changed  Dressing Status Clean;Dry;Intact     Active Orders  Date Order Order Summary Order Comments Authorizing Provider  12/31/23 1245 Wound Management: Use Associated Wounds location (right and left legs); Wound care per specified algorithm/order; Dry; Other (Comment Required) (Vashe); Prep with skin prep and allow to dry; Other (silver  alginate); Other (none); Roll gauze; Dress... Routine, Weekly, First  occurrence on Tue 12/31/23 at 1000, Until Specified Wound Wound/Other Other (Comment) Leg Right;Lower  Wound Wound/Other Other (Comment) Leg Left;Lower  Wound Location: Use Associated Wounds location / right and left legs Wound Management: Wound care per specified algorithm/order Type: Dry Cleanse: Other (Comment Required) / Vashe Prep: Prep with skin prep and allow to dry Fill/Apply: Other / silver  alginate Cover: Other / none Secure with: Roll gauze Change/PRN: Dressing no longer intact (i.e. lifting, leaking, damaged), Dressing damp, moist or saturated - Selinda JINNY Fleeta Valeria, MD  12/03/23 1408 Wound Management: Use Associated Wounds location; Wound care per algorithm Routine, Once, On Tue 12/03/23 at 1404, For 1 occurrence Wound Pressure Injury Sacrum  Wound Pressure Injury Gluteal/Gluteus Left  Wound Pressure Injury Ischial Tuberosity/Ischium Right  Wound Pressure Injury Heel/Calcaneus Right  Wound Pressure Injury Heel/Calcaneus Left  Wound Vascular Wound Arterial/ischemic Foot Left;Plantar  Wound Vascular Wound Arterial/ischemic Toe # Left;1;2;3;4;5  Wound Vascular Wound Arterial/ischemic Toe # Right;1;2;3;4;5  Wound Wound/Other Other (Comment) Leg Right;Lower  Wound Wound/Other Other (Comment) Leg Left;Lower  Wound Vascular Wound Arterial/ischemic Arm Right  Wound Pressure Injury Elbow Right  Initiate wound care protocols Wound Location: Use Associated Wounds location Wound Management: Wound care per algorithm Initiate wound care protocols Corean JONETTA Daring, MD     Wound Wound/Other Other (Comment) Leg Left;Lower (Active)  Date First Assessed/Time First Assessed: 12/03/23 1345   Wound Type - REQUIRED: Wound/Other  Pre-Existing Wound: Yes  Wound Type: (c) Other (Comment)  Anatomical Site: Leg  Wound Location Orientation: Left;Lower    Assessments 01/07/2024  10:04 AM  Wound Image    Wound Image - Comment dry scaling/peeling skin  Wound Length (cm) 25 cm  Wound Width (cm) 15 cm   Wound Depth (cm) 0  Calculated Wound Size (cm^2) 375 cm^2  Calculated Wound Size (cm^3) 0 cm^3  Undermining None present  Granulation Tissue No granulation tissue present  Epithelialization 0 to <25% wound covered  Necrotic Tissue Type None visible  Necrotic Tissue Amount None visible  Wound Edges Indistinct, diffuse, none clearly visible  Periwound Skin Color Pink or normal for ethnic group  Periwound Skin Edema No swelling or edema  Periwound Skin Induration None present  Exudate Type None  Exudate Amount None, dry wound  Odor None  Wound Management Cleansed;Barrier Film;Abdominal dressing;Roll gauze;Other (Comment) (vashe)  Dressing Changed Changed  Dressing Status Clean;Dry;Intact     Active Orders  Date Order Order Summary Order Comments Authorizing Provider  12/31/23 1245 Wound Management: Use Associated Wounds location (right and left legs); Wound care per specified algorithm/order; Dry; Other (Comment Required) (Vashe); Prep with skin prep and allow to dry; Other (silver  alginate); Other (none); Roll gauze; Dress... Routine, Weekly, First occurrence on Tue 12/31/23 at 1000, Until Specified Wound Wound/Other Other (Comment) Leg Right;Lower  Wound Wound/Other Other (Comment) Leg Left;Lower  Wound Location: Use Associated Wounds location / right and left legs Wound Management: Wound care per specified algorithm/order Type: Dry Cleanse: Other (Comment Required) / Vashe Prep: Prep with skin prep and allow to dry Fill/Apply: Other / silver  alginate Cover: Other / none Secure with: Roll gauze Change/PRN: Dressing no longer intact (i.e. lifting, leaking, damaged), Dressing damp, moist or saturated - Selinda JINNY Fleeta Valeria, MD  12/03/23 1408 Wound Management: Use Associated Wounds location; Wound care per algorithm Routine, Once, On Tue 12/03/23 at 1404, For 1 occurrence Wound Pressure Injury Sacrum  Wound Pressure Injury Gluteal/Gluteus Left  Wound Pressure Injury Ischial  Tuberosity/Ischium Right  Wound Pressure Injury Heel/Calcaneus Right  Wound Pressure Injury Heel/Calcaneus Left  Wound Vascular Wound Arterial/ischemic Foot Left;Plantar  Wound Vascular Wound Arterial/ischemic Toe # Left;1;2;3;4;5  Wound Vascular Wound Arterial/ischemic Toe # Right;1;2;3;4;5  Wound Wound/Other Other (Comment) Leg Right;Lower  Wound Wound/Other Other (Comment) Leg Left;Lower  Wound Vascular Wound Arterial/ischemic Arm Right  Wound Pressure Injury Elbow Right  Initiate wound care protocols Wound Location: Use Associated Wounds location Wound Management: Wound care per algorithm Initiate wound care protocols Corean JONETTA Daring, MD     Wound Vascular Wound Arterial/ischemic Arm Right (Active)  Date First Assessed/Time First Assessed: 12/03/23 1347   Wound Type - REQUIRED: Vascular Wound  Vascular Wound Type: Arterial/ischemic  Anatomical Site: (c) Arm  Wound Location Orientation: Right    Assessments 01/07/2024 10:04 AM  Wound Image    Wound Length (cm) 3 cm  Wound Width (cm) 1.5 cm  Wound Depth (cm) 0  Calculated Wound Size (cm^2) 4.5 cm^2  Calculated Wound Size (cm^3) 0 cm^3  Extent of Tissue Loss Full thickness tissue loss (scabbed)  Undermining None present  Granulation Tissue No granulation tissue present  Epithelialization 0 to <25% wound covered  Necrotic Tissue Type None visible  Necrotic Tissue Amount None visible  Wound Edges Indistinct, diffuse, none clearly visible  Periwound Skin Color Pink or normal for ethnic group  Periwound Skin Edema No swelling or edema  Periwound Skin Induration None present  Exudate Type None  Exudate Amount None, dry wound  Odor None  Wound Management Cleansed;Barrier Film;Alginate;Foam;Gauze  Dressing Changed Changed  Dressing Status Clean;Dry;Intact  Active Orders  Date Order Order Summary Order Comments Authorizing Provider  12/03/23 1408 Wound Management: Use Associated Wounds location; Wound care per algorithm  Routine, Once, On Tue 12/03/23 at 1404, For 1 occurrence Wound Pressure Injury Sacrum  Wound Pressure Injury Gluteal/Gluteus Left  Wound Pressure Injury Ischial Tuberosity/Ischium Right  Wound Pressure Injury Heel/Calcaneus Right  Wound Pressure Injury Heel/Calcaneus Left  Wound Vascular Wound Arterial/ischemic Foot Left;Plantar  Wound Vascular Wound Arterial/ischemic Toe # Left;1;2;3;4;5  Wound Vascular Wound Arterial/ischemic Toe # Right;1;2;3;4;5  Wound Wound/Other Other (Comment) Leg Right;Lower  Wound Wound/Other Other (Comment) Leg Left;Lower  Wound Vascular Wound Arterial/ischemic Arm Right  Wound Pressure Injury Elbow Right  Initiate wound care protocols Wound Location: Use Associated Wounds location Wound Management: Wound care per algorithm Initiate wound care protocols Corean JONETTA Daring, MD  12/03/23 1408 Wound Management: Use Associated Wounds location (right arm (amputation site) and right elbow); Wound care per specified algorithm/order; Kathrine; Normal Saline; Prep with skin prep and allow to dry; Alginate silver ; Adhesive foam; Dressing no longer ... Routine, Every Tues, Thurs, Sat, First occurrence on Tue 12/03/23 at 1409, Until Specified Wound Vascular Wound Arterial/ischemic Arm Right  Wound Pressure Injury Elbow Right  Wound Location: Use Associated Wounds location / right arm (amputation site) and right elbow Wound Management: Wound care per specified algorithm/order Type: Wet Cleanse: Normal Saline Prep: Prep with skin prep and allow to dry Fill/Apply: Alginate silver  Cover: Adhesive foam Change/PRN: Dressing no longer intact (i.e. lifting, leaking, damaged), Dressing damp, moist or saturated - Corean JONETTA Daring, MD     Wound Pressure Injury Elbow Right (Active)  Date First Assessed/Time First Assessed: 12/03/23 1356   Wound Type - REQUIRED: Pressure Injury  Pre-Existing Wound: Yes  Anatomical Site: Elbow  Wound Location Orientation: Right    Assessments 01/07/2024  10:04 AM  Wound Image    Stage Stage 4  Wound Length (cm) 2.5 cm  Wound Width (cm) 1.3 cm  Wound Depth (cm) 0.2  Calculated Wound Size (cm^2) 3.25 cm^2  Calculated Wound Size (cm^3) 0.65 cm^3  Change in Wound Size %  86.32  Extent of Tissue Loss Full thickness tissue loss  Undermining None present  Granulation Tissue Pink, and/or dull, dusky red and/or fills <25% of wound  Epithelialization 0 to <25% wound covered  Necrotic Tissue Type Slough: white/gray non-viable and/or non-adherent yellow  Necrotic Tissue Amount <25% wound bed covered  Wound Edges Well-defined, not attached to wound base  Periwound Skin Color White or gray pallor or hypopigmented  Periwound Skin Edema No swelling or edema  Periwound Skin Induration None present  Exudate Type Serous: thin, water , clear  Exudate Amount Small  Odor None  Wound Management Cleansed;Barrier Film;Alginate;Foam;Gauze  Dressing Changed Changed  Dressing Status Clean;Dry;Intact     Active Orders  Date Order Order Summary Order Comments Authorizing Provider  12/03/23 1408 Wound Management: Use Associated Wounds location; Wound care per algorithm Routine, Once, On Tue 12/03/23 at 1404, For 1 occurrence Wound Pressure Injury Sacrum  Wound Pressure Injury Gluteal/Gluteus Left  Wound Pressure Injury Ischial Tuberosity/Ischium Right  Wound Pressure Injury Heel/Calcaneus Right  Wound Pressure Injury Heel/Calcaneus Left  Wound Vascular Wound Arterial/ischemic Foot Left;Plantar  Wound Vascular Wound Arterial/ischemic Toe # Left;1;2;3;4;5  Wound Vascular Wound Arterial/ischemic Toe # Right;1;2;3;4;5  Wound Wound/Other Other (Comment) Leg Right;Lower  Wound Wound/Other Other (Comment) Leg Left;Lower  Wound Vascular Wound Arterial/ischemic Arm Right  Wound Pressure Injury Elbow Right  Initiate wound care protocols Wound Location: Use Associated Wounds location  Wound Management: Wound care per algorithm Initiate wound care protocols Corean JONETTA Daring, MD  12/03/23 1408 Wound Management: Use Associated Wounds location (right arm (amputation site) and right elbow); Wound care per specified algorithm/order; Kathrine; Normal Saline; Prep with skin prep and allow to dry; Alginate silver ; Adhesive foam; Dressing no longer ... Routine, Every Tues, Thurs, Sat, First occurrence on Tue 12/03/23 at 1409, Until Specified Wound Vascular Wound Arterial/ischemic Arm Right  Wound Pressure Injury Elbow Right  Wound Location: Use Associated Wounds location / right arm (amputation site) and right elbow Wound Management: Wound care per specified algorithm/order Type: Wet Cleanse: Normal Saline Prep: Prep with skin prep and allow to dry Fill/Apply: Alginate silver  Cover: Adhesive foam Change/PRN: Dressing no longer intact (i.e. lifting, leaking, damaged), Dressing damp, moist or saturated - Corean JONETTA Daring, MD    Recommendations: Improvement noted, continue with current wound care and preventative POC.  01/07/2024, 0945 (time), I met with patient to provide an update on the wound plan of care including:  List of current, active wounds, etiology and treatment plan  Projected healing process and advanced care needs  Offered to review the wound photos with them Reviewed Prevention Plan of Care Addressed questions or concerns   Signature: JEANNETTE KIRSCH, RN Date: 01/07/2024 Time: 10:12 AM EST       [1] Patient Active Problem List Diagnosis   Recurrent aspiration pneumonia

## 2024-01-08 ENCOUNTER — Other Ambulatory Visit (HOSPITAL_COMMUNITY)

## 2024-01-08 LAB — BASIC METABOLIC PANEL WITH GFR
Anion gap: 10 (ref 5–15)
BUN: 57 mg/dL — ABNORMAL HIGH (ref 8–23)
CO2: 26 mmol/L (ref 22–32)
Calcium: 8.9 mg/dL (ref 8.9–10.3)
Chloride: 109 mmol/L (ref 98–111)
Creatinine, Ser: 0.66 mg/dL (ref 0.61–1.24)
GFR, Estimated: >60 mL/min
Glucose, Bld: 67 mg/dL — ABNORMAL LOW (ref 70–99)
Potassium: 4.5 mmol/L (ref 3.5–5.1)
Sodium: 145 mmol/L (ref 135–145)

## 2024-01-08 LAB — CBC WITH DIFFERENTIAL/PLATELET
Abs Immature Granulocytes: 0.03 K/uL (ref 0.00–0.07)
Basophils Absolute: 0 K/uL (ref 0.0–0.1)
Basophils Relative: 0 %
Eosinophils Absolute: 0.3 K/uL (ref 0.0–0.5)
Eosinophils Relative: 5 %
HCT: 24.7 % — ABNORMAL LOW (ref 39.0–52.0)
Hemoglobin: 7.7 g/dL — ABNORMAL LOW (ref 13.0–17.0)
Immature Granulocytes: 0 %
Lymphocytes Relative: 23 %
Lymphs Abs: 1.6 K/uL (ref 0.7–4.0)
MCH: 32.5 pg (ref 26.0–34.0)
MCHC: 31.2 g/dL (ref 30.0–36.0)
MCV: 104.2 fL — ABNORMAL HIGH (ref 80.0–100.0)
Monocytes Absolute: 1.1 K/uL — ABNORMAL HIGH (ref 0.1–1.0)
Monocytes Relative: 15 %
Neutro Abs: 3.9 K/uL (ref 1.7–7.7)
Neutrophils Relative %: 57 %
Platelets: 295 K/uL (ref 150–400)
RBC: 2.37 MIL/uL — ABNORMAL LOW (ref 4.22–5.81)
RDW: 17.5 % — ABNORMAL HIGH (ref 11.5–15.5)
WBC: 6.9 K/uL (ref 4.0–10.5)
nRBC: 0.3 % — ABNORMAL HIGH (ref 0.0–0.2)

## 2024-01-08 NOTE — Unmapped External Note (Signed)
 Team Conference Note  Date: 01/08/2024   Time: 10:45 AM EST    Patient Name:  [name] [name]      Medical Record Number: [id number]  Date of Birth: December 10, 1945 Sex: Male         Room/Bed:  5E11/5E11-B Admit Date/Time:  12/03/2023  1:03 PM  Patient Active Problem List   Diagnosis    Recurrent aspiration pneumonia       Team Members Present:  Clotilda Ball, WCN  Camie Parkinson. RD Carlin Revel, Pharmacist Tora Ford, Nursing Supervisor  Garnette Louder, RRT Dennie Fuse, CM  Luke Pina, DCM Lynwood Glatter, PT   Case Manager in Attendance: Dennie Fuse, LPN  Anticipated Discharge 01/17/2024   Discharge Plan: Discharge Destination Details : SNF/SNU Back-up Discharge Destination: Own Home  Barriers to Discharge: Caregiver limitations, Capacity for self care, Mobility challenge, Cognitive challenge, Communication challenge, Potential need for skills/non-skilled services, Potential need for 24 hour care, Need may exceed prior care facility capacity  Clinical Barriers: Clinical needs exceed caregiver capacity, Wound  Suicide Risk Assessment: No Concerns identified at this time  Pain Management:: Pain reported as adequately managed at this time.  Team Goals:  Case Management Goal 1: Work with RD to discontinue NG tube and gain appetite Goal 1 - Progress: Not Progressing Goal 1 - Expected End Date: 01/10/24        Nursing (Wound and other) Goal 5: Wound(s) to decrease in size by 0.5% within 2 weeks Goal 5 - Progress: Progressing Goal 5 - Expected End Date: 01/24/24   Respiratory Therapy RT Goals Short Term Goals: Other (comment) (maintain stable respiratory status) Long Term Goals: Return to Previous Home DME Requirements   Pharmacy Current Pharmacy Goals    None        Therapy   PT Weekly Progress Note     PT Weekly Progress Note by Lynwood MATSU. Glatter, PT at 01/08/2024  7:58 AM     Author: Lynwood MATSU. Glatter, PT Service: Therapy Author Type:  Therapy Manager   Filed: 01/08/2024  7:59 AM Date of Service: 01/08/2024   Editor: Lynwood MATSU. Glatter, PT (Therapy Manager)            PT Weekly Progress Note  Patient Name: [name] [name] Patient Birthdate: 05-20-1945  PT CURRENT FUNCTIONAL STATUS:  PT Current Functional Status:    PT Current Functional Status:   Pt is a 78 yo M who presented to hospital on 10/8 due to poor p.o. intake and increased confusion. Pt was dx with sepsis due to possible osteomyelitis, pt was treated with antibiotics. 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 Pt was transferred to [facility] on 12/03/23 for further care. Pt was evaluated by PT on 12/04/23. Pt is total a for bed mobility and transfers. Pt's sitting balance on edge of bed with total A. Pt is willing to participate with ROM, refusing out of bed despite patient education. P: Maintain ROM and prevent further functional decline. Continue to encourage mobility as tolerated.   Factors in Goal Achievement: Facilitating Factors:  Support of family or caregiver Barriers:  Diminished endurance, Balance deficits, ROM limitations and Strength limitations     DME Recommendations Common Therapy DME:  (to be determined)  CARE Score Key: 6: Independent. Helper provides no assistance with tasks. A device may or may not have been used. 5: Set-up or clean-up assistance. Helper sets up or cleans up, but does not assist with tasks.  Helper may have assisted prior to or following the activity. 4: Supervision or touching assistance. Helper provides verbal cues or touching/steadying or contact guard assistance. Assistance may be provided throughout the activity or intermittently. 3: Partial/moderate assistance. Helper does less than half the effort. Helper lifts, holds, or supports trunk or limbs, but provides less than half the effort. 2: Substantial/maximal assistance. Helper does more than half  the effort. Helper lifts or holds trunk or limbs, and provides more than half the effort. 1: Dependent. Helper does all of the effort, or the assistance of two or more helpers is required for the patient to complete the activity. -: Inconsistent or incomplete documentation  Activity not attempted values: 7: Patient refused 9: Not applicable - Not attempted and the patient did not perform this activity prior to the current illness, exacerbation, or injury. 10: Not attempted due to environmental limitations (e.g., lack of equipment, weather constraints) 88: Not attempted due to medical condition or safety concerns  Goals: Goal Status on Admission Current Status    Sit to Lying - CARE Score: 88 (12/04/23 0946) Sit to Lying - CARE Score: 1 (01/08/24 0757)    Lying to Sitting on Side of Bed - CARE Score: 88 (12/04/23 0946) Lying to Sitting on Side of Bed - CARE Score: 1 (01/08/24 0757)    Sit to Stand - CARE Score: 9 (12/04/23 0946)    Chair/Bed-to-Chair Transfer LTG: Dependent Chair/Bed-to-Chair Transfer - CARE Score: 88 (12/04/23 0946) Chair/Bed-to-Chair Transfer - CARE Score: 1 (01/08/24 0757)    Walk 10 Feet - CARE Score: 9 (12/04/23 0946)      Walking 10 Feet on Uneven Surfaces - CARE Score: 9 (12/04/23 0946)      Walk 50 Feet with Two Turns - CARE Score: 9 (12/04/23 0946)      Walk 150 Feet - CARE Score: 9 (12/04/23 0946)      1 Step (Curb) - CARE Score: 9 (12/04/23 0946)      4 Steps - CARE Score: 9 (12/04/23 0946)      12 Steps - CARE Score: 9 (12/04/23 0946)      Wheel 50 Feet with Two Turns - CARE Score: 88 (12/04/23 0946)      Wheel 150 Feet - CARE Score: 88 (12/04/23 0946)     Other Goals: Goal Status on Admission Current Status                                                   Goal Status - Will not present with further deterioration of current condition: Will not present with further deterioration of current condition Status on Evaluation(Complete Only on Evaluation):  total A for mobility, B lower extremities contractures (limited hip and knee flex, DF to neutral) (12/04/23 0947) Current Status: Will not present with further deterioration of current condition (01/08/24 0758)   Additional Goals &Status: N/A      MERILEE LYNWOOD MATSU., PT 01/08/2024             OT Weekly Progress Note     OT Weekly Progress Note by Eleanor Linger, OT at 01/08/2024  9:04 AM     Author: Eleanor Linger, OT Service: Therapy Author Type: Occupational Therapist   Filed: 01/08/2024  9:04 AM Date of Service: 01/08/2024   Editor: Eleanor Linger, OT (Occupational Therapist)  Occupational Therapy     Weekly Progress Note  Patient Name: Jerrit Horen Patient Birthdate: [birth year]    OT Current Functional Status:  OT reassessment completed 12/23, per report:  Pt has been seen by OT to address: ROM, and simple grooming tasks. Pt demonstrates AROM for B shoulder flexion and elbow. Pt is able to wash face with set up assistance and demonstrates physical ability to self feed though refuses to self-feed.           CARE Score Key: 6: Independent. Helper provides no assistance with tasks. A device may or may not have been used. 5: Set-up or clean-up assistance. Helper sets up or cleans up, but does not assist with tasks. Helper may have assisted prior to or following the activity. 4: Supervision or touching assistance. Helper provides verbal cues or touching/steadying or contact guard assistance. Assistance may be provided throughout the activity or intermittently. 3: Partial/moderate assistance. Helper does less than half the effort. Helper lifts, holds, or supports trunk or limbs, but provides less than half the effort. 2: Substantial/maximal assistance. Helper does more than half the effort. Helper lifts or holds trunk or limbs, and provides more than half the effort. 1: Dependent. Helper does all of the effort, or the assistance of two or more helpers is  required for the patient to complete the activity. -: Inconsistent or incomplete documentation  Activity not attempted values: 7: Patient refused 9: Not applicable - Not attempted and the patient did not perform this activity prior to the current illness, exacerbation, or injury. 10: Not attempted due to environmental limitations (e.g., lack of equipment, weather constraints) 88: Not attempted due to medical condition or safety concerns  Goals: Goal Status on Admission Current Status    Roll Left and Right - CARE Score: 1 (12/05/23 1035) Roll Left and Right - CARE Score: 1 (01/08/24 0900)  Eating LTG: Partial/moderate assistance Eating - CARE Score: 1 (12/05/23 1035) Eating - CARE Score: 2 (01/08/24 0900)  Oral Hygiene LTG: Partial/moderate assistance Oral Hygiene - CARE Score: 1 (12/05/23 1035) Oral Hygiene - CARE Score: 1 (01/08/24 0900)    Toilet Transfer - CARE Score: 9 (12/05/23 1035) Toilet Transfer - CARE Score: 9 (01/08/24 0900)    Toileting Hygiene - CARE Score: 1 (12/05/23 1035) Toileting Hygiene - CARE Score: 1 (01/08/24 0900)     Picking Up Object - CARE Score: 9 (12/05/23 1035) Picking Up Object - CARE Score: 9 (01/08/24 0900)     Car Transfer - CARE Score: 10 (12/05/23 1035) Car Transfer - CARE Score: 10 (01/08/24 0900)    Other Goals: Goal Status on Admission Current Status                                                                               Goal Status - Patient will not present with further deterioration of  current condition: Will not present with further deterioration of current condition Status on Evaluation(Complete Only on Evaluation): will not present wtih further deterioration of current condition (12/05/23 1035) Current Status: Will not present with further deterioration of current condition (01/08/24 0903)    Additional Goals &Status: N/A      LILLA SETTER, OT 01/08/2024  SLP Weekly Progress Note  Notes from 01/01/24  through 01/08/24  No notes of this type exist for this encounter.      Nutrition Nutrition Goals (Active)     Problem: Increased Nutrient Needs     Dates: Start:  12/05/23      Description: Increased need for a specific nutrient compared to established reference standards or recommendations based on physiological needs.  Related to: Increased demand for protein for wound healing As evidenced by: multiple wounds    Goal: Promote Wound Healing     Dates: Start:  12/05/23    Expected End:  01/24/24       Outcomes     Date/Time User Outcome   01/03/24 0941 Camie KYM Parkinson, RD Progressing   12/27/23 1223 Camie KYM Parkinson, RD Progressing      Flowsheet     Taken at 01/03/24 [phone]   Enteral and Parenteral Nutrition: Enteral nutrition by  Camie KYM Parkinson, RD          Medications: Current Medications[1]  Pharmacy: Latest Pharmacy IDT Documentation      Value Time User   Medications Reviewed:  No Barriers to Discharge 01/07/2024  8:55 AM  Kaitlyn Swaney, PharmD                                                Inpatient Morphine  Milligram Equivalents Per Day 12/17 - 12/25     No orders with morphine  equivalence       Nursing Team Conference:  Bladder Continent: Incontinent Bladder Devices: External catheter Bladder Interventions: None Bladder: Describe level of assistance required and any barriers encountered in management: Patient remains incontinent  Bowel Continent: Incontinent Bowel Devices: None Bowel Interventions: None Bowel: Describe level of assistance required and any barriers encountered in management: Total care  Pain: Patient denies pain   Pain - Functional Impact: None Aggravating Factors Impacting Pain: Positioning Alleviating Factors Impacting Pain: Distraction;Repositioning;Rest Pain Education Provided: Patient Non-Pharmacologic Pain Interventions: Distractions;Position/Reposition Emotional/Spiritual Pain Interventions:  Emotional Support Pain Consults Initiated or Completed: Pharmacy Pain Pharmacologic Treatments: Medicated - see MAR Opiate Use Anticipated After Discharge: No Pain: Describe patient response (degree, duration, reduction) to interventions during past 7 days: Pain controlled  Nutrition Intake: Oral Nutrition Level of Assistance: Total assistance Nutrition Interventions: None Nutrition: General appetite of patient over past 7 days, how diet tolerated, how responding to interventions, are boluses or supplements: Poor appetite  Safety Status: Good safety awareness Safety Interventions: Education Safety: Describe response to interventions during past 7 days, include identified triggers for behavior episodes, and management plans: No safety concerns noted at this time  Cognitive Orientation: Person;Place Cognitive Deficits Observed: None Cognitive: Describe interventions used during past 7 days and response: WNL  Quality of Sleep: Restful Approximate Hours Slept: 8 Sleep: Describe interventions in use and patient response during past 7 days: Restful  Medical Issues: Wound care and nutritional support Pain Issues: Pain controlled Educational Topics: safety Patient-Family barriers to learning: None Is patient on dialysis?: No    Mobility: What is the patient's mobility level?: Level 2: Lateral Transfer OOB & ROM Please identify the most relevant barrier to mobilization: No Barriers   Respiratory Team Conference: Patient is not currently receiving RT services                Nutrition Team Conference:    Diet Orders  (  From admission, onward)           Start     Ordered   01/07/24 1313  Adult NPO Diet Meds with Sips of Water   Diet effective now       End/Expires: Until Specified    Question Answer Comment  NPO Status: Meds with Sips of Water    Place order in third party system. Done      01/07/24 1312   01/06/24 1226  Glucerna 1.5; Tube Feeding Continuous rate  (mL/hr): 50; Tube Feeding water  flush (mL): 40; Water  Flush type: Water ; Water  flush frequency: Every 2 hours; Water  Flush Method: Automatic  Diet effective now       End/Expires: Until Specified    Question Answer Comment  Tube Feeding Formula: Glucerna 1.5   Tube Feeding Continuous rate (mL/hr): 50   Tube Feeding water  flush (mL): 40   Water  Flush type: Water    Water  flush frequency: Every 2 hours   Water  Flush Method: Automatic      01/06/24 1226   12/05/23 1800  Nutritional supplement Juven; 1 unit pack; Feeding tube  RD 2 times daily       End/Expires: Until Specified    Question Answer Comment  [facility] Supplement: Juven   Volume: 1 unit pack   Administration Route: Feeding tube      12/05/23 0928   Unscheduled  Diet message  As needed     Comments: Patient is full assistance with all meals.  End/Expires: Until Specified     12/09/23 1059            Height: 6' (182.9 cm)  Admit Weight: 155 lb 6.8 oz (70.5 kg)  Current Weight: 171 lb 6.4 oz (77.7 kg)  Body mass index is 23.25 kg/m. Calorie Count:    Nutrition Intake: Oral;Enteral Nutrition Nutrition Risk: Patient at nutrition risk Nutrition Recommendations: Encourage PO intake Nutrition Barriers to D/C: NG tube Nutrition Update: Poor PO intake continues. Not PEG candidate. MD speaking with family  Wound: Wound Rx: Other (Comment);Topical (comment) (vashe, gauze, skin prep, foam, abd pads, kerlix) Wound: Current Status: Improving, wound(s) decreasing in size       [1]  Current Facility-Administered Medications:    glucose chewable tablet 12 g, 3 tablet, Oral, PRN **OR** 4 ounces of juice or non-diet carbonated beverage for hypoglycemia, 4 fluid ounce, Oral, PRN, Corean JONETTA Daring, MD, 4 fluid ounce at 01/01/24 2250   acetaminophen  (TYLENOL ) tablet 650 mg, 650 mg, Per Tube, Q6H PRN, Corean JONETTA Daring, MD, 650 mg at 12/19/23 2242   amLODIPine  (NORVASC ) tablet 5 mg, 5 mg, Oral, Once a day, Corean JONETTA Daring, MD, 5 mg at 01/07/24 9057   aspirin  chewable tablet 81 mg, 81 mg, Oral, Once a day, Corean JONETTA Daring, MD, 81 mg at 01/07/24 9057   atorvastatin (LIPITOR) tablet 20 mg, 20 mg, PO/Per Tube, Nightly, Jason J Van Eyk, MD, 20 mg at 01/07/24 2220   dextrose  5% in water  infusion, 30 mL/hr, Intravenous, Continuous, Jason J Van Eyk, MD, Last Rate: 30 mL/hr at 01/07/24 1729, 30 mL/hr at 01/07/24 1729   dextrose  50 % IV solution 12.5 g, 12.5 g, Intravenous, PRN **OR** dextrose  50 % IV solution 25 g, 25 g, Intravenous, PRN, Corean JONETTA Daring, MD, 25 g at 12/20/23 1716   Electrolyte Replacement, , Does not apply, Once a day, Corean JONETTA Daring, MD   fludrocortisone  (FLORINEF ) tablet 0.1 mg, 100 mcg, Oral, 2 times per day, Corean JONETTA Daring, MD, 0.1  mg at 01/07/24 2223   glucagon  injection reconstituted solution 1 mg, 1 mg, Intramuscular, PRN, Stephanie D Brown, MD   heparin  (porcine) injection 5,000 Units, 5,000 Units, Subcutaneous, Q8H SCH, Stephanie D Brown, MD, 5,000 Units at 01/07/24 2217   insulin  glargine injection 18 Units, 18 Units, Subcutaneous, Once a day, Corean JONETTA Daring, MD, 18 Units at 01/07/24 1003   insulin  lispro injection 0-12 Units, 0-12 Units, Subcutaneous, Q6H SCH, 2 Units at 01/07/24 1147 **AND** POCT glucose, , , Q6H SCH, Corean JONETTA Daring, MD   magnesium  oxide tablet 400 mg, 400 mg, PO/Per Tube, Q6H PRN **OR** magnesium  sulfate infusion 2 g 50 mL, 2 g, Intravenous, Q2H PRN, Stephanie D Brown, MD   mirtazapine (REMERON) tablet 15 mg, 15 mg, Oral, Nightly, Stephanie D Brown, MD, 15 mg at 01/07/24 2217   Omeprazole  ODT (Prilosec  OTC) disintegrating tablet 40 mg, 40 mg, PO/Per Tube, Once a day, Corean JONETTA Daring, MD, 40 mg at 01/07/24 9057   potassium chloride  (KLOR-CON ) ER tablet 40 mEq, 40 mEq, PO/Per Tube, Q4H PRN **OR** potassium chloride  20 mEq in 100 mL IVPB premix, 20 mEq, Intravenous, Q2H PRN, Corean JONETTA Daring, MD
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
## 2024-01-12 ENCOUNTER — Other Ambulatory Visit (HOSPITAL_COMMUNITY)

## 2024-01-12 LAB — CBC WITH DIFFERENTIAL/PLATELET
Abs Immature Granulocytes: 0.04 K/uL (ref 0.00–0.07)
Basophils Absolute: 0 K/uL (ref 0.0–0.1)
Basophils Relative: 1 %
Eosinophils Absolute: 0.4 K/uL (ref 0.0–0.5)
Eosinophils Relative: 6 %
HCT: 24.3 % — ABNORMAL LOW (ref 39.0–52.0)
Hemoglobin: 7.5 g/dL — ABNORMAL LOW (ref 13.0–17.0)
Immature Granulocytes: 1 %
Lymphocytes Relative: 33 %
Lymphs Abs: 2.1 K/uL (ref 0.7–4.0)
MCH: 32.1 pg (ref 26.0–34.0)
MCHC: 30.9 g/dL (ref 30.0–36.0)
MCV: 103.8 fL — ABNORMAL HIGH (ref 80.0–100.0)
Monocytes Absolute: 1 K/uL (ref 0.1–1.0)
Monocytes Relative: 16 %
Neutro Abs: 2.8 K/uL (ref 1.7–7.7)
Neutrophils Relative %: 43 %
Platelets: 290 K/uL (ref 150–400)
RBC: 2.34 MIL/uL — ABNORMAL LOW (ref 4.22–5.81)
RDW: 16.5 % — ABNORMAL HIGH (ref 11.5–15.5)
WBC: 6.3 K/uL (ref 4.0–10.5)
nRBC: 0.3 % — ABNORMAL HIGH (ref 0.0–0.2)

## 2024-01-12 LAB — BASIC METABOLIC PANEL WITH GFR
Anion gap: 7 (ref 5–15)
BUN: 54 mg/dL — ABNORMAL HIGH (ref 8–23)
CO2: 28 mmol/L (ref 22–32)
Calcium: 8.7 mg/dL — ABNORMAL LOW (ref 8.9–10.3)
Chloride: 113 mmol/L — ABNORMAL HIGH (ref 98–111)
Creatinine, Ser: 0.69 mg/dL (ref 0.61–1.24)
GFR, Estimated: >60 mL/min
Glucose, Bld: 81 mg/dL (ref 70–99)
Potassium: 5.4 mmol/L — ABNORMAL HIGH (ref 3.5–5.1)
Sodium: 148 mmol/L — ABNORMAL HIGH (ref 135–145)

## 2024-01-13 LAB — POTASSIUM: Potassium: 5.1 mmol/L (ref 3.5–5.1)

## 2024-01-17 LAB — BASIC METABOLIC PANEL WITH GFR
Anion gap: 10 (ref 5–15)
BUN: 39 mg/dL — ABNORMAL HIGH (ref 8–23)
CO2: 23 mmol/L (ref 22–32)
Calcium: 8.7 mg/dL — ABNORMAL LOW (ref 8.9–10.3)
Chloride: 106 mmol/L (ref 98–111)
Creatinine, Ser: 0.73 mg/dL (ref 0.61–1.24)
GFR, Estimated: >60 mL/min
Glucose, Bld: 85 mg/dL (ref 70–99)
Potassium: 4.3 mmol/L (ref 3.5–5.1)
Sodium: 139 mmol/L (ref 135–145)

## 2024-01-17 LAB — CBC WITH DIFFERENTIAL/PLATELET
Abs Immature Granulocytes: 0.04 K/uL (ref 0.00–0.07)
Basophils Absolute: 0 K/uL (ref 0.0–0.1)
Basophils Relative: 0 %
Eosinophils Absolute: 0.4 K/uL (ref 0.0–0.5)
Eosinophils Relative: 5 %
HCT: 25.3 % — ABNORMAL LOW (ref 39.0–52.0)
Hemoglobin: 7.9 g/dL — ABNORMAL LOW (ref 13.0–17.0)
Immature Granulocytes: 1 %
Lymphocytes Relative: 24 %
Lymphs Abs: 1.8 K/uL (ref 0.7–4.0)
MCH: 31.5 pg (ref 26.0–34.0)
MCHC: 31.2 g/dL (ref 30.0–36.0)
MCV: 100.8 fL — ABNORMAL HIGH (ref 80.0–100.0)
Monocytes Absolute: 1 K/uL (ref 0.1–1.0)
Monocytes Relative: 13 %
Neutro Abs: 4.4 K/uL (ref 1.7–7.7)
Neutrophils Relative %: 57 %
Platelets: 342 K/uL (ref 150–400)
RBC: 2.51 MIL/uL — ABNORMAL LOW (ref 4.22–5.81)
RDW: 15.4 % (ref 11.5–15.5)
WBC: 7.7 K/uL (ref 4.0–10.5)
nRBC: 0 % (ref 0.0–0.2)
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
## 2024-01-18 NOTE — Nursing Note (Signed)
 This nurse attempted to feed all meals and patient refused intake at breakfast and dinner and only consumed 25% of lunch in the form of magic cup.
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
## 2024-01-22 LAB — COMPREHENSIVE METABOLIC PANEL WITH GFR
ALT: 32 U/L (ref 0–44)
AST: 34 U/L (ref 15–41)
Albumin: 2.9 g/dL — ABNORMAL LOW (ref 3.5–5.0)
Alkaline Phosphatase: 142 U/L — ABNORMAL HIGH (ref 38–126)
Anion gap: 11 (ref 5–15)
BUN: 28 mg/dL — ABNORMAL HIGH (ref 8–23)
CO2: 24 mmol/L (ref 22–32)
Calcium: 8.7 mg/dL — ABNORMAL LOW (ref 8.9–10.3)
Chloride: 107 mmol/L (ref 98–111)
Creatinine, Ser: 0.82 mg/dL (ref 0.61–1.24)
GFR, Estimated: >60 mL/min
Glucose, Bld: 82 mg/dL (ref 70–99)
Potassium: 4.3 mmol/L (ref 3.5–5.1)
Sodium: 141 mmol/L (ref 135–145)
Total Bilirubin: 0.3 mg/dL (ref 0.0–1.2)
Total Protein: 7.3 g/dL (ref 6.5–8.1)

## 2024-01-22 LAB — CBC WITH DIFFERENTIAL/PLATELET
Abs Immature Granulocytes: 0.02 K/uL (ref 0.00–0.07)
Basophils Absolute: 0 K/uL (ref 0.0–0.1)
Basophils Relative: 0 %
Eosinophils Absolute: 0.3 K/uL (ref 0.0–0.5)
Eosinophils Relative: 5 %
HCT: 24.9 % — ABNORMAL LOW (ref 39.0–52.0)
Hemoglobin: 7.8 g/dL — ABNORMAL LOW (ref 13.0–17.0)
Immature Granulocytes: 0 %
Lymphocytes Relative: 31 %
Lymphs Abs: 2.1 K/uL (ref 0.7–4.0)
MCH: 31.3 pg (ref 26.0–34.0)
MCHC: 31.3 g/dL (ref 30.0–36.0)
MCV: 100 fL (ref 80.0–100.0)
Monocytes Absolute: 1.2 K/uL — ABNORMAL HIGH (ref 0.1–1.0)
Monocytes Relative: 18 %
Neutro Abs: 3 K/uL (ref 1.7–7.7)
Neutrophils Relative %: 46 %
Platelets: 361 K/uL (ref 150–400)
RBC: 2.49 MIL/uL — ABNORMAL LOW (ref 4.22–5.81)
RDW: 15.2 % (ref 11.5–15.5)
WBC: 6.7 K/uL (ref 4.0–10.5)
nRBC: 0.3 % — ABNORMAL HIGH (ref 0.0–0.2)

## 2024-01-27 LAB — BASIC METABOLIC PANEL WITH GFR
Anion gap: 10 (ref 5–15)
BUN: 51 mg/dL — ABNORMAL HIGH (ref 8–23)
CO2: 25 mmol/L (ref 22–32)
Calcium: 8.8 mg/dL — ABNORMAL LOW (ref 8.9–10.3)
Chloride: 104 mmol/L (ref 98–111)
Creatinine, Ser: 0.75 mg/dL (ref 0.61–1.24)
GFR, Estimated: >60 mL/min
Glucose, Bld: 132 mg/dL — ABNORMAL HIGH (ref 70–99)
Potassium: 4.1 mmol/L (ref 3.5–5.1)
Sodium: 139 mmol/L (ref 135–145)

## 2024-01-27 LAB — CBC WITH DIFFERENTIAL/PLATELET
Abs Immature Granulocytes: 0.02 K/uL (ref 0.00–0.07)
Basophils Absolute: 0 K/uL (ref 0.0–0.1)
Basophils Relative: 0 %
Eosinophils Absolute: 0.6 K/uL — ABNORMAL HIGH (ref 0.0–0.5)
Eosinophils Relative: 7 %
HCT: 25.9 % — ABNORMAL LOW (ref 39.0–52.0)
Hemoglobin: 8.2 g/dL — ABNORMAL LOW (ref 13.0–17.0)
Immature Granulocytes: 0 %
Lymphocytes Relative: 32 %
Lymphs Abs: 2.4 K/uL (ref 0.7–4.0)
MCH: 31.1 pg (ref 26.0–34.0)
MCHC: 31.7 g/dL (ref 30.0–36.0)
MCV: 98.1 fL (ref 80.0–100.0)
Monocytes Absolute: 1.1 K/uL — ABNORMAL HIGH (ref 0.1–1.0)
Monocytes Relative: 15 %
Neutro Abs: 3.5 K/uL (ref 1.7–7.7)
Neutrophils Relative %: 46 %
Platelets: 312 K/uL (ref 150–400)
RBC: 2.64 MIL/uL — ABNORMAL LOW (ref 4.22–5.81)
RDW: 15.3 % (ref 11.5–15.5)
WBC: 7.6 K/uL (ref 4.0–10.5)
nRBC: 0.4 % — ABNORMAL HIGH (ref 0.0–0.2)

## 2024-01-29 NOTE — Unmapped External Note (Signed)
 Team Conference Note  Date: 01/29/2024   Time: 1:11 PM EST    Patient Name:  [name] [name]      Medical Record Number: [id number]  Date of Birth: [birth year] Sex: Male         Room/Bed:  5E11/5E11-B Admit Date/Time:  12/03/2023  1:03 PM  Patient Active Problem List   Diagnosis    Recurrent aspiration pneumonia       Team Members Present:  Clotilda Ball, WCN  Camie Parkinson. RD Kaitlyn Swaney, Pharmacist Luke Pina, DCM [place], Nursing Supervisor  Marcey Louder, RRT Dennie Fuse, CM  Lynwood Glatter, PT  Case Manager in Attendance: Dennie Fuse, LPN  Anticipated Discharge 01/29/2024   Discharge Plan: Discharge Destination Details : SNF/SNU Back-up Discharge Destination: Own Home  Barriers to Discharge: Caregiver limitations, Capacity for self care, Mobility challenge, Cognitive challenge, Communication challenge, Potential need for skills/non-skilled services, Potential need for 24 hour care, Need may exceed prior care facility capacity  Clinical Barriers: Clinical needs exceed caregiver capacity  Suicide Risk Assessment: No Concerns identified at this time  Pain Management:: Pain reported as adequately managed at this time.  Team Goals:  Case Management Goal 1: Work with RD to discontinue NG tube and gain appetite Goal 1 - Progress: Completed Goal 1 - Expected End Date: 01/14/24        Nursing (Wound and other) Goal 5: Wound(s) to remain free from infection through hospital stay Goal 5 - Progress: Progressing Goal 5 - Expected End Date: 02/21/24   Respiratory Therapy RT Goals Short Term Goals: Other (comment) (maintain stable respiratory status) Long Term Goals: Return to Previous Home DME Requirements   Pharmacy Current Pharmacy Goals    None        Therapy   PT Weekly Progress Note     PT Weekly Progress Note by Lynwood MATSU. Glatter, PT at 01/29/2024  6:40 AM     Author: Lynwood MATSU. Glatter, PT Service: Therapy Author Type: Therapy Manager    Filed: 01/29/2024  6:40 AM Date of Service: 01/29/2024   Editor: Lynwood MATSU. Glatter, PT (Therapy Manager)            PT Weekly Progress Note  Patient Name: [name] [name] Patient Birthdate: 06/03/1945  PT CURRENT FUNCTIONAL STATUS:  PT Current Functional Status:    PT Current Functional Status:  Pt is a 79 yo M who presented to hospital on 10/8 due to poor p.o. intake and increased confusion. Pt was dx with sepsis due to possible osteomyelitis, pt was treated with antibiotics. 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 Pt was transferred to [facility] on 12/03/23 for further care. Pt was evaluated by PT on 12/04/23. Pt is total a for bed mobility and transfers. Pt's sitting balance on edge of bed with total A. Pt is willing to participate with ROM and sitting on edge of bed with encouragement. P: Maintain ROM and prevent further functional decline.   Factors in Goal Achievement: Facilitating Factors:  Support of family or caregiver Barriers:  None     DME Recommendations Common Therapy DME:  (to be determined)  CARE Score Key: 6: Independent. Helper provides no assistance with tasks. A device may or may not have been used. 5: Set-up or clean-up assistance. Helper sets up or cleans up, but does not assist with tasks. Helper may have assisted prior to or following the activity. 4: Supervision or touching assistance. Helper provides verbal  cues or touching/steadying or contact guard assistance. Assistance may be provided throughout the activity or intermittently. 3: Partial/moderate assistance. Helper does less than half the effort. Helper lifts, holds, or supports trunk or limbs, but provides less than half the effort. 2: Substantial/maximal assistance. Helper does more than half the effort. Helper lifts or holds trunk or limbs, and provides more than half the effort. 1: Dependent. Helper does all of the effort, or  the assistance of two or more helpers is required for the patient to complete the activity. -: Inconsistent or incomplete documentation  Activity not attempted values: 7: Patient refused 9: Not applicable - Not attempted and the patient did not perform this activity prior to the current illness, exacerbation, or injury. 10: Not attempted due to environmental limitations (e.g., lack of equipment, weather constraints) 88: Not attempted due to medical condition or safety concerns  Goals: Goal Status on Admission Current Status    Sit to Lying - CARE Score: 88 (12/04/23 0946) Sit to Lying - CARE Score: 1 (01/29/24 0639)    Lying to Sitting on Side of Bed - CARE Score: 88 (12/04/23 0946) Lying to Sitting on Side of Bed - CARE Score: 1 (01/29/24 0639)    Sit to Stand - CARE Score: 9 (12/04/23 0946)    Chair/Bed-to-Chair Transfer LTG: Dependent Chair/Bed-to-Chair Transfer - CARE Score: 88 (12/04/23 0946) Chair/Bed-to-Chair Transfer - CARE Score: 1 (01/29/24 0639)    Walk 10 Feet - CARE Score: 9 (12/04/23 0946)      Walking 10 Feet on Uneven Surfaces - CARE Score: 9 (12/04/23 0946)      Walk 50 Feet with Two Turns - CARE Score: 9 (12/04/23 0946)      Walk 150 Feet - CARE Score: 9 (12/04/23 0946)      1 Step (Curb) - CARE Score: 9 (12/04/23 0946)      4 Steps - CARE Score: 9 (12/04/23 0946)      12 Steps - CARE Score: 9 (12/04/23 0946)      Wheel 50 Feet with Two Turns - CARE Score: 88 (12/04/23 0946)      Wheel 150 Feet - CARE Score: 88 (12/04/23 0946)     Other Goals: Goal Status on Admission Current Status                                                   Goal Status - Will not present with further deterioration of current condition: Will not present with further deterioration of current condition Status on Evaluation(Complete Only on Evaluation): total A for mobility, B lower extremities contractures (limited hip and knee flex, DF to neutral) (12/04/23 0947) Current Status: Will not  present with further deterioration of current condition (01/29/24 9360)   Additional Goals &Status: N/A      MERILEE LYNWOOD MATSU., PT 01/29/2024             OT Weekly Progress Note     OT Weekly Progress Note by Eleanor Linger, OT at 01/29/2024  9:57 AM     Author: Eleanor Linger, OT Service: Therapy Author Type: Occupational Therapist   Filed: 01/29/2024  9:59 AM Date of Service: 01/29/2024   Editor: Eleanor Linger, OT (Occupational Therapist)            Occupational Therapy     Weekly Progress Note  Patient Name: [name]  [name] Patient Birthdate: 08-15-1945    OT Current Functional Status:  Pt engages in BUE AROM with functional ability to wash face with wash cloth when placed on LUE. Pt is able to self-feed with set up assistance and placement of spoon into L hand.    Facilitating Factors in Goal Achievement:  Patient compliance       CARE Score Key: 6: Independent. Helper provides no assistance with tasks. A device may or may not have been used. 5: Set-up or clean-up assistance. Helper sets up or cleans up, but does not assist with tasks. Helper may have assisted prior to or following the activity. 4: Supervision or touching assistance. Helper provides verbal cues or touching/steadying or contact guard assistance. Assistance may be provided throughout the activity or intermittently. 3: Partial/moderate assistance. Helper does less than half the effort. Helper lifts, holds, or supports trunk or limbs, but provides less than half the effort. 2: Substantial/maximal assistance. Helper does more than half the effort. Helper lifts or holds trunk or limbs, and provides more than half the effort. 1: Dependent. Helper does all of the effort, or the assistance of two or more helpers is required for the patient to complete the activity. -: Inconsistent or incomplete documentation  Activity not attempted values: 7: Patient refused 9: Not applicable - Not attempted and the  patient did not perform this activity prior to the current illness, exacerbation, or injury. 10: Not attempted due to environmental limitations (e.g., lack of equipment, weather constraints) 88: Not attempted due to medical condition or safety concerns  Goals: Goal Status on Admission Current Status    Roll Left and Right - CARE Score: 1 (12/05/23 1035) Roll Left and Right - CARE Score: 1 (01/29/24 0956)  Eating LTG: Partial/moderate assistance Eating - CARE Score: 1 (12/05/23 1035) Eating - CARE Score: 5 (01/29/24 0956)  Oral Hygiene LTG: Partial/moderate assistance Oral Hygiene - CARE Score: 1 (12/05/23 1035) Oral Hygiene - CARE Score: 1 (01/29/24 0956)    Toilet Transfer - CARE Score: 9 (12/05/23 1035) Toilet Transfer - CARE Score: 9 (01/29/24 0956)    Toileting Hygiene - CARE Score: 1 (12/05/23 1035) Toileting Hygiene - CARE Score: 1 (01/29/24 0956)     Picking Up Object - CARE Score: 9 (12/05/23 1035) Picking Up Object - CARE Score: 9 (01/29/24 0956)     Car Transfer - CARE Score: 10 (12/05/23 1035) Car Transfer - CARE Score: 10 (01/29/24 0956)    Other Goals: Goal Status on Admission Current Status                                                                               Goal Status - Patient will not present with further deterioration of  current condition: Will not present with further deterioration of current condition Status on Evaluation(Complete Only on Evaluation): will not present wtih further deterioration of current condition (12/05/23 1035) Current Status: Will not present with further deterioration of current condition (01/29/24 0957)    Additional Goals &Status: N/A      LILLA SETTER, OT 01/29/2024           SLP Weekly Progress Note  Notes from 01/22/24 through 01/29/24  No notes  of this type exist for this encounter.      Nutrition Nutrition Goals (Active)     Problem: Increased Nutrient Needs     Dates: Start:  12/05/23       Description: Increased need for a specific nutrient compared to established reference standards or recommendations based on physiological needs.  Related to: Increased demand for protein for wound healing As evidenced by: multiple wounds    Goal: Promote Wound Healing     Dates: Start:  12/05/23    Expected End:  03/27/24       Outcomes     Date/Time User Outcome   01/24/24 0932 Camie KYM Parkinson, RD Progressing   01/16/24 1123 Heron CHARM Resides, RD Progressing      Flowsheet     Taken at 01/24/24 0932   Meals and Snacks: Modify composition of meals/snacks by  Camie KYM Parkinson, RD Comment: Dys I diet   Medical Food Supplement Therapy: Commercial beverage/Oral nutrition supplement;Commercial food/Modular nutritional supplement by  Camie KYM Parkinson, RD Comment: Juven, Magic cup, Ensure plus high protein   Indicator/Monitor: PO intake, wt, labs, GI, skin by  Camie KYM Parkinson, RD          Medications: Current Medications[1]  Pharmacy: Latest Pharmacy IDT Documentation      Value Time User   Medications Reviewed:  No Barriers to Discharge 01/28/2024 10:01 AM  Kaitlyn Swaney, PharmD                                                Inpatient Morphine  Milligram Equivalents Per Day 1/7 - 1/15     No orders with morphine  equivalence       Nursing Team Conference:  Bladder Continent: Incontinent Bladder Devices: External catheter Bladder Interventions: None Bladder: Describe level of assistance required and any barriers encountered in management: patient remains inontinent at the moment and requires one  Bowel Continent: Incontinent Bowel Devices: None Bowel Interventions: None Bowel: Describe level of assistance required and any barriers encountered in management: patient remains incontinent at this time and requires  Pain: Patient verbalizes pain Pain score: 0 Pain - Functional Impact: None Aggravating Factors Impacting Pain: Anxiety Alleviating  Factors Impacting Pain: Repositioning;Rest;Sleep Pain Education Provided: Family Non-Pharmacologic Pain Interventions: Distractions;Music Emotional/Spiritual Pain Interventions: Emotional Support;Empathetic Discussion Pain Consults Initiated or Completed: Other (Comment) Pain Pharmacologic Treatments: Medicated - see MAR Opiate Use Anticipated After Discharge: Possible Pain: Describe patient response (degree, duration, reduction) to interventions during past 7 days: pain controlled by the current pain  Nutrition Intake: Oral Nutrition Level of Assistance: Total assistance Nutrition Interventions: Modified consistency Nutrition: General appetite of patient over past 7 days, how diet tolerated, how responding to interventions, are boluses or supplements: poor oral intake  Safety Status: Good safety awareness Safety Interventions: Education Safety: Describe response to interventions during past 7 days, include identified triggers for behavior episodes, and management plans: no saftey concerns  Cognitive Orientation: Person;Place Cognitive Deficits Observed: None Cognitive: Describe interventions used during past 7 days and response: some confusion  Quality of Sleep: Restful Approximate Hours Slept: 8 Sleep: Describe interventions in use and patient response during past 7 days: WNL  Medical Issues: wound care Pain Issues: pain controlled Educational Topics: saftey Patient-Family barriers to learning: None Is patient on dialysis?: No    Mobility: What is the patient's mobility level?: Level 2: Lateral Transfer  OOB & ROM Please identify the most relevant barrier to mobilization: No Barriers   Respiratory Team Conference:                  Nutrition Team Conference:    Diet Orders  (From admission, onward)           Start     Ordered   01/22/24 1000  Nutritional supplement Ensure Plus High Protein; 1 each; Oral  2 times daily between meals       End/Expires: Until  Specified    Question Answer Comment  [facility] Supplement: Ensure Plus High Protein   Volume: 1 each   Administration Route: Oral      01/22/24 0927   01/16/24 1200  Nutritional supplement Magic Cup; 1 each; Oral  2 times daily AT breakfast and lunch       End/Expires: Until Specified    Question Answer Comment  [facility] Supplement: Magic Cup   Volume: 1 each   Administration Route: Oral      01/16/24 1149   01/12/24 1405  Adult Diet Regular; 4 Pureed (NDD I); 0 Thin (All Liquids)  Diet effective now       End/Expires: Until Specified    References:    [facility] Website  Question Answer Comment  Diet Type: Regular   Diet Texture: 4 Pureed (NDD I)   Liquid Consistency: 0 Thin (All Liquids)   Place order in third party system. Done      01/12/24 1405   12/05/23 1800  Nutritional supplement Juven; 1 unit pack; Feeding tube  RD 2 times daily       End/Expires: Until Specified    Question Answer Comment  [facility] Supplement: Juven   Volume: 1 unit pack   Administration Route: Feeding tube      12/05/23 0928   Unscheduled  Diet message  As needed     Comments: Patient is full assistance with all meals.  End/Expires: Until Specified     12/09/23 1059            Height: 6' (182.9 cm)  Admit Weight: 155 lb 6.8 oz (70.5 kg)  Current Weight: 171 lb 1.6 oz (77.6 kg)  Body mass index is 23.21 kg/m. Calorie Count:    Nutrition Intake: Oral Nutrition Risk: Patient at nutrition risk Nutrition Recommendations: Continue current nutrition care plan Nutrition Barriers to D/C: None Nutrition Update: PO intake improved  Wound: Wound Rx: Other (Comment) Wound: Current Status: stable       [1]  Current Facility-Administered Medications:    glucose chewable tablet 12 g, 3 tablet, Oral, PRN, 12 g at 01/20/24 1740 **OR** 4 ounces of juice or non-diet carbonated beverage for hypoglycemia, 4 fluid ounce, Oral, PRN, Corean JONETTA Daring, MD, 4 fluid ounce at 01/01/24 2250    acetaminophen  (TYLENOL ) tablet 650 mg, 650 mg, Per Tube, Q6H PRN, Corean JONETTA Daring, MD, 650 mg at 01/12/24 2113   amLODIPine  (NORVASC ) tablet 5 mg, 5 mg, Oral, Once a day, Corean JONETTA Daring, MD, 5 mg at 01/29/24 1038   aspirin  chewable tablet 81 mg, 81 mg, Oral, Once a day, Corean JONETTA Daring, MD, 81 mg at 01/29/24 1038   atorvastatin (LIPITOR) tablet 20 mg, 20 mg, PO/Per Tube, Nightly, Jason J Van Eyk, MD, 20 mg at 01/28/24 2203   dextrose  50 % IV solution 12.5 g, 12.5 g, Intravenous, PRN **OR** dextrose  50 % IV solution 25 g, 25 g, Intravenous, PRN, Corean JONETTA Daring, MD, 25 g  at 01/20/24 2303   Electrolyte Replacement, , Does not apply, Once a day, Corean JONETTA Daring, MD   fludrocortisone  (FLORINEF ) tablet 0.1 mg, 100 mcg, Oral, 2 times per day, Corean JONETTA Daring, MD, 0.1 mg at 01/29/24 1037   glucagon  injection reconstituted solution 1 mg, 1 mg, Intramuscular, PRN, Stephanie D Brown, MD   heparin  (porcine) injection 5,000 Units, 5,000 Units, Subcutaneous, Q8H SCH, Stephanie D Brown, MD, 5,000 Units at 01/29/24 0531   insulin  lispro injection 0-12 Units, 0-12 Units, Subcutaneous, Q6H SCH, 4 Units at 01/28/24 2352 **AND** POCT glucose, , , Q6H SCH, Corean JONETTA Daring, MD   magnesium  oxide tablet 400 mg, 400 mg, PO/Per Tube, Q6H PRN, 400 mg at 01/21/24 0943 **OR** magnesium  sulfate infusion 2 g 50 mL, 2 g, Intravenous, Q2H PRN, Stephanie D Brown, MD   mirtazapine (REMERON) tablet 15 mg, 15 mg, Oral, Nightly, Stephanie D Brown, MD, 15 mg at 01/28/24 2203   Omeprazole  ODT (Prilosec  OTC) disintegrating tablet 40 mg, 40 mg, PO/Per Tube, Once a day, Corean JONETTA Daring, MD, 40 mg at 01/29/24 1037   potassium chloride  (KLOR-CON ) ER tablet 40 mEq, 40 mEq, PO/Per Tube, Q4H PRN **OR** potassium chloride  20 mEq in 100 mL IVPB premix, 20 mEq, Intravenous, Q2H PRN, Corean JONETTA Daring, MD
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
## 2024-01-29 NOTE — Unmapped External Note (Signed)
" °  Problem: Infection Goal: Absence of infection and prevention of transmission during hospitalization Outcome: Adequate for Discharge   Problem: Knowledge Deficit Goal: Patient and/or family demonstrate readiness to learn Outcome: Adequate for Discharge Goal: Patient and/or family verbalizes understanding of education, and/or performs desired skill Outcome: Adequate for Discharge   Problem: Discharge Planning Goal: Discharge to home or other facility with appropriate resources Outcome: Adequate for Discharge Goal: Care giver will develop a plan to decrease their burden and enhance comfort in role Outcome: Adequate for Discharge   Problem: Delirium Goal: Prevent and Manage Delirium Outcome: Adequate for Discharge   Problem: Fall Safety: Universal Precautions Goal: Free from fall injury Outcome: Adequate for Discharge Goal: Toilet Magnet Status ([facility] Only) Outcome: Adequate for Discharge   Problem: Fall Prevention: Medication Bundle Goal: Patient will be free from Fall Injury Outcome: Adequate for Discharge   Problem: Fall Prevention: Continence Bundle Goal: Patient will be free from Fall Injury Outcome: Adequate for Discharge   Problem: Fall Prevention: Environment/Sensory Bundle Goal: Patient will be free from Fall Injury Outcome: Adequate for Discharge   Problem: Fall Prevention Across Bundles Goal: Patient will be free from Fall Injury Outcome: Adequate for Discharge   Problem: Potential for Compromised Skin Integrity Goal: Skin integrity is maintained or improved Outcome: Adequate for Discharge Goal: Nutritional status is improving Outcome: Adequate for Discharge   Problem: Urinary Incontinence Goal: Perineal skin integrity is maintained or improved Outcome: Adequate for Discharge   Problem: Bowel Incontinence Goal: Perineal Skin Integrity is Maintained or Improved Outcome: Adequate for Discharge   "

## 2024-01-31 NOTE — Unmapped External Note (Signed)
 "                                    Physical Therapy         Discharge Summary  Patient Name: [name] [name] Patient Birthdate: 04/16/1945  Patient Status:    Patient Status:  Discharge to Home/IRF/SNF  PT CURRENT FUNCTIONAL STATUS:  PT Current Functional Status:    PT Current Functional Status:  Pt is a 79 yo M who presented to hospital on 10/8 due to poor p.o. intake and increased confusion. Pt was dx with sepsis due to possible osteomyelitis, pt was treated with antibiotics. 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 Pt was transferred to [facility] on 12/03/23 for further care. Pt was evaluated by PT on 12/04/23. Pt has B lower extremity contractures and 0/5 strength B lower extremities. Pt is total A for bed mobility and transfers. Pt tolerates edge of bed with mod to max A. Pt was discharged to SNF on 01/29/24 for further care.   Factors in Goal Achievement: Facilitating Factors:  None Barriers:  None   Patient needs assistance with the following activities:  Balance, Sitting balance, Walking and/or mobility, Rolling and Positioning   RLE:  Full weight-bearing LLE:  Full weight-bearing Weight Bearing Status:  Full - No restrictions throughout   DME Recommendations Common Therapy DME:  (to be determined)  CARE Scores Key:  6: Independent. Helper provides no assistance with tasks. A device may or may not have been used. 5: Set-up or clean-up assistance. Helper sets up or cleans up, but does not assist with tasks. Helper may have assisted prior to or following the activity. 4: Supervision or touching assistance. Helper provides verbal cues or touching/steadying or contact guard assistance. Assistance may be provided throughout the activity or intermittently. 3: Partial/moderate assistance. Helper does less than half the effort. Helper lifts, holds, or supports trunk or limbs, but provides less than half the  effort. 2: Substantial/maximal assistance. Helper does more than half the effort. Helper lifts or holds trunk or limbs, and provides more than half the effort. 1: Dependent. Helper does all of the effort, or the assistance of two or more helpers is required for the patient to complete the activity. -: Inconsistent or incomplete documentation  Activity not attempted values: 7: Patient refused 9: Not applicable - Not attempted and the patient did not perform this activity prior to the current illness, exacerbation, or injury. 10: Not attempted due to environmental limitations (e.g., lack of equipment, weather constraints) 88: Not attempted due to medical condition or safety concerns   Goals: Goal Goal Status Discharge Status    Sit to Lying - CARE Score: 1 (01/29/24 0639)    Lying to Sitting on Side of Bed - CARE Score: 1 (01/29/24 0639)    Sit to Stand - CARE Score: 9 (01/29/24 0639)  Chair/Bed-to-Chair Transfer LTG: Dependent Achieved Chair/Bed-to-Chair Transfer - CARE Score: 1 (01/29/24 0639)    Walk 10 Feet - CARE Score: 9 (01/29/24 0639)    Walking 10 Feet on Uneven Surfaces - CARE Score: 10 (01/29/24 0639)    Walk 50 Feet with Two Turns - CARE Score: 9 (01/29/24 0639)    Walk 150 Feet - CARE Score: 9 (01/29/24 0639)    1 Step (Curb) - CARE Score: 9 (01/29/24 9360)    4 Steps - CARE  Score: 9 (01/29/24 0639)    12 Steps - CARE Score: 9 (01/29/24 0639)    Wheel 50 Feet with Two Turns - CARE Score: 9 (01/29/24 0639)    Wheel 150 Feet - CARE Score: 9 (01/29/24 9360)   Other Goals: Goal Goal Status Discharge Status                              Goal Status - Will not present with further deterioration of current condition: Will not present with further deterioration of current condition Achieved Current Status: Will not present with further deterioration of current condition (01/29/24 9360)   Additional Goals: N/A      Discharge Instructions given to patient:  PT Discharge  Instructions   Current level of help needed at this time:  Transfers: You will need a mechanical lift to complete a transfer.  Walking: You are not able to walk.  Wheelchair Use: You need someone to push your wheelchair all the time.  Stair Climbing: You will need to use a ramp.    Home Exercise Program: Continue following the exercise program that was instructed to you by your therapist prior to discharge.          MERILEE LYNWOOD MATSU., PT 01/31/2024        "

## 2024-02-13 ENCOUNTER — Inpatient Hospital Stay (HOSPITAL_COMMUNITY)
Admission: EM | Admit: 2024-02-13 | Source: Home / Self Care | Attending: Pulmonary Disease | Admitting: Pulmonary Disease

## 2024-02-13 ENCOUNTER — Emergency Department (HOSPITAL_COMMUNITY)

## 2024-02-13 ENCOUNTER — Inpatient Hospital Stay (HOSPITAL_COMMUNITY)

## 2024-02-13 DIAGNOSIS — Z515 Encounter for palliative care: Secondary | ICD-10-CM

## 2024-02-13 DIAGNOSIS — R0602 Shortness of breath: Secondary | ICD-10-CM

## 2024-02-13 DIAGNOSIS — A419 Sepsis, unspecified organism: Secondary | ICD-10-CM

## 2024-02-13 DIAGNOSIS — J810 Acute pulmonary edema: Secondary | ICD-10-CM

## 2024-02-13 DIAGNOSIS — Z7189 Other specified counseling: Secondary | ICD-10-CM

## 2024-02-13 DIAGNOSIS — J9601 Acute respiratory failure with hypoxia: Secondary | ICD-10-CM

## 2024-02-13 DIAGNOSIS — R579 Shock, unspecified: Secondary | ICD-10-CM

## 2024-02-13 DIAGNOSIS — I5031 Acute diastolic (congestive) heart failure: Secondary | ICD-10-CM

## 2024-02-13 DIAGNOSIS — R4182 Altered mental status, unspecified: Principal | ICD-10-CM

## 2024-02-13 DIAGNOSIS — G9340 Encephalopathy, unspecified: Secondary | ICD-10-CM | POA: Diagnosis present

## 2024-02-13 LAB — URINALYSIS, W/ REFLEX TO CULTURE (INFECTION SUSPECTED)
Bilirubin Urine: NEGATIVE
Glucose, UA: NEGATIVE mg/dL
Ketones, ur: NEGATIVE mg/dL
Nitrite: NEGATIVE
Protein, ur: 30 mg/dL — AB
Specific Gravity, Urine: 1.008 (ref 1.005–1.030)
WBC, UA: >50 WBC/hpf (ref 0–5)
pH: 8 (ref 5.0–8.0)

## 2024-02-13 LAB — CBC WITH DIFFERENTIAL/PLATELET
Abs Immature Granulocytes: 0.02 10*3/uL (ref 0.00–0.07)
Basophils Absolute: 0 10*3/uL (ref 0.0–0.1)
Basophils Relative: 0 %
Eosinophils Absolute: 0.1 10*3/uL (ref 0.0–0.5)
Eosinophils Relative: 1 %
HCT: 27.8 % — ABNORMAL LOW (ref 39.0–52.0)
Hemoglobin: 8.3 g/dL — ABNORMAL LOW (ref 13.0–17.0)
Immature Granulocytes: 0 %
Lymphocytes Relative: 13 %
Lymphs Abs: 1.3 10*3/uL (ref 0.7–4.0)
MCH: 29.9 pg (ref 26.0–34.0)
MCHC: 29.9 g/dL — ABNORMAL LOW (ref 30.0–36.0)
MCV: 100 fL (ref 80.0–100.0)
Monocytes Absolute: 0.6 10*3/uL (ref 0.1–1.0)
Monocytes Relative: 6 %
Neutro Abs: 8.3 10*3/uL — ABNORMAL HIGH (ref 1.7–7.7)
Neutrophils Relative %: 80 %
Platelets: 358 10*3/uL (ref 150–400)
RBC: 2.78 MIL/uL — ABNORMAL LOW (ref 4.22–5.81)
RDW: 14.9 % (ref 11.5–15.5)
WBC: 10.3 10*3/uL (ref 4.0–10.5)
nRBC: 0.3 % — ABNORMAL HIGH (ref 0.0–0.2)

## 2024-02-13 LAB — COMPREHENSIVE METABOLIC PANEL WITH GFR
ALT: 20 U/L (ref 0–44)
AST: 29 U/L (ref 15–41)
Albumin: 3.2 g/dL — ABNORMAL LOW (ref 3.5–5.0)
Alkaline Phosphatase: 151 U/L — ABNORMAL HIGH (ref 38–126)
Anion gap: 12 (ref 5–15)
BUN: 41 mg/dL — ABNORMAL HIGH (ref 8–23)
CO2: 22 mmol/L (ref 22–32)
Calcium: 8.8 mg/dL — ABNORMAL LOW (ref 8.9–10.3)
Chloride: 110 mmol/L (ref 98–111)
Creatinine, Ser: 1.01 mg/dL (ref 0.61–1.24)
GFR, Estimated: >60 mL/min
Glucose, Bld: 167 mg/dL — ABNORMAL HIGH (ref 70–99)
Potassium: 4.6 mmol/L (ref 3.5–5.1)
Sodium: 144 mmol/L (ref 135–145)
Total Bilirubin: 0.3 mg/dL (ref 0.0–1.2)
Total Protein: 8.2 g/dL — ABNORMAL HIGH (ref 6.5–8.1)

## 2024-02-13 LAB — PROTIME-INR
INR: 1.1 (ref 0.8–1.2)
Prothrombin Time: 14.9 s (ref 11.4–15.2)

## 2024-02-13 LAB — I-STAT CG4 LACTIC ACID, ED: Lactic Acid, Venous: 0.9 mmol/L (ref 0.5–1.9)

## 2024-02-13 MED ORDER — ETOMIDATE 2 MG/ML IV SOLN
INTRAVENOUS | Status: AC
  Filled 2024-02-13: qty 10

## 2024-02-13 MED ORDER — LACTATED RINGERS IV SOLN: INTRAVENOUS | Status: DC

## 2024-02-13 MED ORDER — POLYETHYLENE GLYCOL 3350 17 G PO PACK: 17.0000 g | PACK | Freq: Every day | ORAL | Status: AC | PRN

## 2024-02-13 MED ORDER — CHLORHEXIDINE GLUCONATE CLOTH 2 % EX PADS
6.0000 | MEDICATED_PAD | Freq: Every day | CUTANEOUS | Status: AC
  Administered 2024-02-14 – 2024-02-21 (×6): 6 via TOPICAL

## 2024-02-13 MED ORDER — ONDANSETRON HCL 4 MG/2ML IJ SOLN
INTRAMUSCULAR | Status: AC
  Filled 2024-02-13: qty 2

## 2024-02-13 MED ORDER — FUROSEMIDE 10 MG/ML IJ SOLN
40.0000 mg | Freq: Once | INTRAMUSCULAR | Status: AC
  Administered 2024-02-13: 40 mg via INTRAVENOUS
  Filled 2024-02-13: qty 4

## 2024-02-13 MED ORDER — SODIUM CHLORIDE 0.9 % IV SOLN
2.0000 g | Freq: Once | INTRAVENOUS | Status: AC
  Administered 2024-02-13: 2 g via INTRAVENOUS
  Filled 2024-02-13: qty 12.5

## 2024-02-13 MED ORDER — METRONIDAZOLE 500 MG/100ML IV SOLN
500.0000 mg | Freq: Once | INTRAVENOUS | Status: AC
  Administered 2024-02-13: 500 mg via INTRAVENOUS
  Filled 2024-02-13: qty 100

## 2024-02-13 MED ORDER — VANCOMYCIN HCL IN DEXTROSE 1-5 GM/200ML-% IV SOLN
1000.0000 mg | Freq: Once | INTRAVENOUS | Status: AC
  Administered 2024-02-13: 1000 mg via INTRAVENOUS
  Filled 2024-02-13: qty 200

## 2024-02-13 MED ORDER — SENNA 8.6 MG PO TABS: 1.0000 | ORAL_TABLET | Freq: Two times a day (BID) | ORAL | Status: AC | PRN

## 2024-02-13 MED ORDER — ONDANSETRON HCL 4 MG/2ML IJ SOLN
4.0000 mg | Freq: Once | INTRAMUSCULAR | Status: AC
  Administered 2024-02-13: 4 mg via INTRAVENOUS

## 2024-02-13 MED ORDER — INSULIN ASPART 100 UNIT/ML IJ SOLN
0.0000 [IU] | INTRAMUSCULAR | Status: AC
  Administered 2024-02-14 – 2024-02-21 (×4): 1 [IU] via SUBCUTANEOUS
  Administered 2024-02-21: 2 [IU] via SUBCUTANEOUS
  Administered 2024-02-21: 1 [IU] via SUBCUTANEOUS
  Filled 2024-02-13 (×5): qty 1
  Filled 2024-02-13: qty 2
  Filled 2024-02-13 (×2): qty 1

## 2024-02-13 MED ORDER — ROCURONIUM BROMIDE 10 MG/ML (PF) SYRINGE
PREFILLED_SYRINGE | INTRAVENOUS | Status: AC
  Filled 2024-02-13: qty 10

## 2024-02-13 MED ORDER — SODIUM CHLORIDE 0.9 % IV SOLN: INTRAVENOUS | Status: AC | PRN

## 2024-02-13 MED ORDER — HEPARIN SODIUM (PORCINE) 5000 UNIT/ML IJ SOLN
5000.0000 [IU] | Freq: Three times a day (TID) | INTRAMUSCULAR | Status: DC
  Administered 2024-02-14: 5000 [IU] via SUBCUTANEOUS
  Filled 2024-02-13: qty 1

## 2024-02-13 NOTE — ED Triage Notes (Signed)
 Patient arrived from facility due to AMS and shortness of breath, O2 saturation was 67% on EMS arrival.

## 2024-02-13 NOTE — Progress Notes (Signed)
 Elink monitoring for the code sepsis protocol.
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
## 2024-02-13 NOTE — Progress Notes (Signed)
 PCCM   Long d/w daughter who wishes full code blue, but use restraint with mechanical ventilation as possible and try to make due with NIV in the meanwhile.The patient became non-verbal yesterday evening or this morning. Last known baseline was in the afternoon yesterday when he was indeed able to speak with his daughter.   //[name] [name]
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
## 2024-02-13 NOTE — ED Notes (Signed)
 Paged Intensivist for the PT to Dr. Palumbo

## 2024-02-13 NOTE — Progress Notes (Signed)
 Pt transported to CT and then to ICU room 1230 on Bipap with no complications & ambu at hob.

## 2024-02-13 NOTE — ED Notes (Addendum)
 EDP at bedside
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
## 2024-02-13 NOTE — ED Notes (Signed)
 Daughter, [phone]

## 2024-02-14 ENCOUNTER — Inpatient Hospital Stay (HOSPITAL_COMMUNITY)

## 2024-02-14 DIAGNOSIS — I5031 Acute diastolic (congestive) heart failure: Secondary | ICD-10-CM

## 2024-02-14 DIAGNOSIS — J9602 Acute respiratory failure with hypercapnia: Secondary | ICD-10-CM | POA: Diagnosis not present

## 2024-02-14 DIAGNOSIS — A419 Sepsis, unspecified organism: Secondary | ICD-10-CM

## 2024-02-14 DIAGNOSIS — E11622 Type 2 diabetes mellitus with other skin ulcer: Secondary | ICD-10-CM | POA: Diagnosis not present

## 2024-02-14 DIAGNOSIS — G9341 Metabolic encephalopathy: Secondary | ICD-10-CM | POA: Diagnosis not present

## 2024-02-14 DIAGNOSIS — J90 Pleural effusion, not elsewhere classified: Secondary | ICD-10-CM | POA: Diagnosis not present

## 2024-02-14 DIAGNOSIS — J9601 Acute respiratory failure with hypoxia: Secondary | ICD-10-CM

## 2024-02-14 DIAGNOSIS — L89159 Pressure ulcer of sacral region, unspecified stage: Secondary | ICD-10-CM

## 2024-02-14 DIAGNOSIS — J690 Pneumonitis due to inhalation of food and vomit: Secondary | ICD-10-CM | POA: Diagnosis not present

## 2024-02-14 DIAGNOSIS — N390 Urinary tract infection, site not specified: Secondary | ICD-10-CM | POA: Diagnosis not present

## 2024-02-14 DIAGNOSIS — M7989 Other specified soft tissue disorders: Secondary | ICD-10-CM | POA: Diagnosis not present

## 2024-02-14 DIAGNOSIS — R4182 Altered mental status, unspecified: Secondary | ICD-10-CM

## 2024-02-14 DIAGNOSIS — E8809 Other disorders of plasma-protein metabolism, not elsewhere classified: Secondary | ICD-10-CM

## 2024-02-14 DIAGNOSIS — E11649 Type 2 diabetes mellitus with hypoglycemia without coma: Secondary | ICD-10-CM | POA: Diagnosis not present

## 2024-02-14 DIAGNOSIS — J810 Acute pulmonary edema: Secondary | ICD-10-CM

## 2024-02-14 DIAGNOSIS — R6521 Severe sepsis with septic shock: Secondary | ICD-10-CM | POA: Diagnosis not present

## 2024-02-14 LAB — GLUCOSE, CAPILLARY
Glucose-Capillary: 140 mg/dL — ABNORMAL HIGH (ref 70–99)
Glucose-Capillary: 70 mg/dL (ref 70–99)
Glucose-Capillary: 81 mg/dL (ref 70–99)
Glucose-Capillary: 85 mg/dL (ref 70–99)
Glucose-Capillary: 88 mg/dL (ref 70–99)
Glucose-Capillary: 94 mg/dL (ref 70–99)

## 2024-02-14 LAB — ECHOCARDIOGRAM COMPLETE
Area-P 1/2: 5.13 cm2
Calc EF: 59.3 %
Height: 72 in
S' Lateral: 3.1 cm
Single Plane A2C EF: 61.7 %
Single Plane A4C EF: 58.4 %
Weight: 2649.05 [oz_av]

## 2024-02-14 LAB — RESP PANEL BY RT-PCR (RSV, FLU A&B, COVID)  RVPGX2
Influenza A by PCR: NEGATIVE
Influenza B by PCR: NEGATIVE
Resp Syncytial Virus by PCR: NEGATIVE
SARS Coronavirus 2 by RT PCR: NEGATIVE

## 2024-02-14 LAB — PRO BRAIN NATRIURETIC PEPTIDE: Pro Brain Natriuretic Peptide: 2562 pg/mL — ABNORMAL HIGH

## 2024-02-14 LAB — BLOOD GAS, ARTERIAL
Acid-base deficit: 2.2 mmol/L — ABNORMAL HIGH (ref 0.0–2.0)
Bicarbonate: 24.2 mmol/L (ref 20.0–28.0)
Delivery systems: POSITIVE
Drawn by: 75061
Expiratory PAP: 6 cmH2O
FIO2: 50 %
Inspiratory PAP: 12 cmH2O
O2 Saturation: 91.8 %
Patient temperature: 36.3
pCO2 arterial: 46 mmHg (ref 32–48)
pH, Arterial: 7.33 — ABNORMAL LOW (ref 7.35–7.45)
pO2, Arterial: 52 mmHg — ABNORMAL LOW (ref 83–108)

## 2024-02-14 LAB — HEPARIN LEVEL (UNFRACTIONATED): Heparin Unfractionated: 0.85 [IU]/mL — ABNORMAL HIGH (ref 0.30–0.70)

## 2024-02-14 LAB — PROCALCITONIN: Procalcitonin: 4.18 ng/mL

## 2024-02-14 LAB — TSH: TSH: 2.32 u[IU]/mL (ref 0.350–4.500)

## 2024-02-14 LAB — AMMONIA: Ammonia: 18 umol/L (ref 9–35)

## 2024-02-14 LAB — STREP PNEUMONIAE URINARY ANTIGEN: Strep Pneumo Urinary Antigen: NEGATIVE

## 2024-02-14 LAB — MRSA NEXT GEN BY PCR, NASAL: MRSA by PCR Next Gen: NOT DETECTED

## 2024-02-14 MED ORDER — LACTATED RINGERS IV BOLUS: 500.0000 mL | Freq: Once | INTRAVENOUS | Status: DC

## 2024-02-14 MED ORDER — ALBUMIN HUMAN 5 % IV SOLN
25.0000 g | Freq: Once | INTRAVENOUS | Status: AC
  Administered 2024-02-14: 25 g via INTRAVENOUS
  Filled 2024-02-14: qty 500

## 2024-02-14 MED ORDER — DEXTROSE IN LACTATED RINGERS 5 % IV SOLN: INTRAVENOUS | Status: DC

## 2024-02-14 MED ORDER — PHENYLEPHRINE HCL-NACL 20-0.9 MG/250ML-% IV SOLN: 0.0000 ug/min | INTRAVENOUS | Status: DC

## 2024-02-14 MED ORDER — ORAL CARE MOUTH RINSE: 15.0000 mL | OROMUCOSAL | Status: DC | PRN

## 2024-02-14 MED ORDER — SODIUM CHLORIDE 0.9 % IV SOLN
250.0000 mL | INTRAVENOUS | Status: AC
  Administered 2024-02-14: 250 mL via INTRAVENOUS

## 2024-02-14 MED ORDER — VANCOMYCIN HCL 750 MG/150ML IV SOLN
750.0000 mg | Freq: Two times a day (BID) | INTRAVENOUS | Status: DC
  Administered 2024-02-14: 750 mg via INTRAVENOUS
  Filled 2024-02-14: qty 150

## 2024-02-14 MED ORDER — HEPARIN BOLUS VIA INFUSION
2500.0000 [IU] | Freq: Once | INTRAVENOUS | Status: AC
  Administered 2024-02-14: 2500 [IU] via INTRAVENOUS
  Filled 2024-02-14: qty 2500

## 2024-02-14 MED ORDER — ORAL CARE MOUTH RINSE
15.0000 mL | OROMUCOSAL | Status: AC
  Administered 2024-02-14 – 2024-02-21 (×30): 15 mL via OROMUCOSAL

## 2024-02-14 MED ORDER — PHENYLEPHRINE HCL-NACL 20-0.9 MG/250ML-% IV SOLN
25.0000 ug/min | INTRAVENOUS | Status: DC
  Administered 2024-02-14: 125 ug/min via INTRAVENOUS
  Administered 2024-02-14: 165 ug/min via INTRAVENOUS
  Administered 2024-02-14: 105 ug/min via INTRAVENOUS
  Administered 2024-02-14: 165 ug/min via INTRAVENOUS
  Administered 2024-02-14: 25 ug/min via INTRAVENOUS
  Administered 2024-02-14: 165 ug/min via INTRAVENOUS
  Filled 2024-02-14 (×6): qty 250

## 2024-02-14 MED ORDER — NOREPINEPHRINE 4 MG/250ML-% IV SOLN
0.0000 ug/min | INTRAVENOUS | Status: DC
  Administered 2024-02-14: 2 ug/min via INTRAVENOUS
  Filled 2024-02-14: qty 250

## 2024-02-14 MED ORDER — SODIUM CHLORIDE 0.9 % IV SOLN
2.0000 g | Freq: Three times a day (TID) | INTRAVENOUS | Status: AC
  Administered 2024-02-14 – 2024-02-18 (×15): 2 g via INTRAVENOUS
  Filled 2024-02-14 (×16): qty 12.5

## 2024-02-14 MED ORDER — HEPARIN (PORCINE) 25000 UT/250ML-% IV SOLN
950.0000 [IU]/h | INTRAVENOUS | Status: DC
  Administered 2024-02-14: 1300 [IU]/h via INTRAVENOUS
  Administered 2024-02-15: 1150 [IU]/h via INTRAVENOUS
  Filled 2024-02-14 (×2): qty 250

## 2024-02-14 NOTE — Progress Notes (Signed)
 02/14/2024 Needs something peripheral for MAP. Given acute RV dysfunction would do levo instead of neo for now.  Rolan Sharps MD PCCM

## 2024-02-14 NOTE — Consult Note (Addendum)
 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  Healing Stage 4 sacrum red moist clean  2.  Healing stage 3 L ischium pink moist  3.  Stage 3 Pressure Injury R elbow 60% red moist 40% tan fibrinous  4.  Full and partial thickness B lower legs likely r/t venous insufficiency some dark hyperkeratotic tissue noted with scattered areas of pink moist  5.  Stage 2 Pressure Injury L 2nd digit pink moist; unstageable L plantar foot 100% black eschar  Pressure Injury POA: Yes Measurement: see nursing flowsheet; plantar foot wound L 5 cm x 3 cm eschar  Wound bed: as above  Drainage (amount, consistency, odor) see nursing flowsheet  Periwound:scar tissue around sacral wound  Dressing procedure/placement/frequency: Cleanse sacral/buttocks/ischial wounds with Vashe, do not rinse. Apply silver  hydrofiber to wounds daily and secure with silicone foam or ABD pad and clothe tape whichever is preferred.  Cleanse R elbow wound with Vashe, do not rinse. Apply silver  hydrofiber ([id number]/[facility]#[id number]) to wound bed daily and secure with silicone foam or kerlix roll gauze whichever is preferred.  Cleanse B lower legs with Vashe, do not rinse.  Apply Xeroform gauze (WD#[id number]/[facility]#[id number]) to anterior legs/wound beds daily, cover with ABD pads and secure with Kerlix roll gauze beginning right above toes and ending right below  knees.   4. Paint L plantar foot eschar with Betadine  daily.  Cleanse L 2nd toe with NS and apply silver  hydrofiber to wound bed daily.  Secure dressings with Kerlix roll gauze.     POC discussed with bedside nurse.  Patient should remain on a low air loss mattress when moved out of ICU setting for pressure redistribution and moisture management.  RD consult for wound healing.  WOC team will not follow.    Thank you,    Powell Bar MSN, RN-BC,  [employer]
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
## 2024-02-14 NOTE — Progress Notes (Signed)
 Venous duplex lower ext  has been completed. Refer to [facility] under chart review to view preliminary results.   02/14/2024  11:52 AM [name] [name], Ricka BIRCH

## 2024-02-14 NOTE — Plan of Care (Signed)
" °  Problem: Education: Goal: Ability to describe self-care measures that may prevent or decrease complications (Diabetes Survival Skills Education) will improve Outcome: Not Progressing   Problem: Metabolic: Goal: Ability to maintain appropriate glucose levels will improve Outcome: Not Progressing   Problem: Nutritional: Goal: Maintenance of adequate nutrition will improve Outcome: Not Progressing Goal: Progress toward achieving an optimal weight will improve Outcome: Not Progressing   Problem: Skin Integrity: Goal: Risk for impaired skin integrity will decrease Outcome: Not Progressing   Problem: Clinical Measurements: Goal: Will remain free from infection Outcome: Not Progressing Goal: Respiratory complications will improve Outcome: Not Progressing Goal: Cardiovascular complication will be avoided Outcome: Not Progressing   Problem: Activity: Goal: Risk for activity intolerance will decrease Outcome: Not Progressing   Problem: Nutrition: Goal: Adequate nutrition will be maintained Outcome: Not Progressing   Problem: Elimination: Goal: Will not experience complications related to urinary retention Outcome: Not Progressing   Problem: Skin Integrity: Goal: Risk for impaired skin integrity will decrease Outcome: Not Progressing   "

## 2024-02-14 NOTE — Progress Notes (Signed)
 Pharmacy Antibiotic Note  [name] [name] is a 79 y.o. male admitted on 02/13/2024 with aspiration pneumonia, acute hypoxemic respiratory failure.  Patient received Vancomycin  1g, Cefepime  2g and metronidazole  500mg  IV x 1 dose each in ED.  Pharmacy has been consulted for Vancomycin  dosing.  Plan: Vancomycin  750 mg IV Q 12 hrs. Goal AUC 400-550.  Expected AUC: 482.1  SCr used: 1.01 Cefepime  2g IV q8h Follow renal function F/u culture results & sensitivities  Weight: 75.1 kg (165 lb 9.1 oz)  Temp (24hrs), Avg:97.3 F (36.3 C), Min:97.1 F (36.2 C), Max:97.4 F (36.3 C)  Recent Labs  Lab 02/13/24 2000 02/13/24 2009  WBC 10.3  --   CREATININE 1.01  --   LATICACIDVEN  --  0.9    Estimated Creatinine Clearance: 64 mL/min (by C-G formula based on SCr of 1.01 mg/dL).    Allergies[1]  Antimicrobials this admission: 1/29 Metronidazole  x 1 1/29 Cefepime  >>   1/29 Vancomycin  >>  Dose adjustments this admission:    Microbiology results: 1/29 BCx:   1/29 UCx:    1/30 MRSA PCR:    Thank you for allowing pharmacy to be a part of this patients care.  Arvin Gauss, PharmD 02/14/2024 1:24 AM     [1]  Allergies Allergen Reactions   Baclofen Other (See Comments)    Left shoulder twitching/jerking   Lisinopril  Other (See Comments)    Hyperkalemia (a high level of the electrolyte potassium in the blood)

## 2024-02-14 NOTE — Progress Notes (Signed)
" °  Echocardiogram 2D Echocardiogram has been performed.  [name] [name] 02/14/2024, 8:24 AM "

## 2024-02-14 NOTE — Progress Notes (Signed)
 PCCM  Requiring low dose of neo via peripheral protocol, from wide pulse pressure Echo order changed to STAT Remains broadly covered, MRSA scr had not returned while in ED Sats remain at 100% while weaning FiO2 on NIV Daughter will arrive at bedside before noon  //Haniel Fix

## 2024-02-14 NOTE — IPAL (Signed)
" °  Interdisciplinary Goals of Care Family Meeting   Date carried out: 02/14/2024  Location of the meeting: Bedside  Member's involved: Physician, Bedside Registered Nurse, and Family Member or next of kin (daughter Cavan Bearden)  Durable Power of Attorney or environmental health practitioner: Manette Bring    Discussion: We discussed goals of care for [employer]. I explained that he is being treated for aspiration pneumonia and sequelae of this (acute respiratory failure requiring non-invasive breathing support and encephalopathy / impaired mentation). I further explained that this appears to have resulted from him taking nutrition by mouth after he self-discontinued his nasogastric tube while at rehab/SNF. Daughter wishes to give him every chance at recovery except for those things that would be associated with an unacceptable level of discomfort or suffering. As a result, we discussed and agreed that intubation and CPR would not be performed.  Code status:   Code Status: Limited: Do not attempt resuscitation (DNR) -DNR-LIMITED -Do Not Intubate/DNI    Disposition: Continue current acute care  Time spent for the meeting: 45 minutes   Lamar JINNY Dales, MD  02/14/2024, 1:23 PM   "

## 2024-02-14 NOTE — Progress Notes (Signed)
 eLink Physician-Brief Progress Note Patient Name: [name] [name] DOB: 05-13-1945 MRN: [id number]   Date of Service  02/14/2024  HPI/Events of Note  79 year old male admitted through ED where he presented with shortness of breath.  There was also reportedly a fever with altered mental status.  For hypoxia, he was placed on noninvasive ventilation which improved his hypoxemia.  Chest imaging suggested that hypoxia was from pulmonary edema.  He however has an aspiration risk which could make the infiltrates related to aspiration as well.  eICU Interventions  Patient's chart reviewed.  Pertinent labs and imaging studies reviewed.  Video assessment of patient done with bedside RN present.  Blood pressure has since dropped and is now at a systolic of about 70. Impression: Acute hypoxemic respiratory failure Aspiration pneumonia Possible CHF exacerbation but patient has normal systolic and diastolic LV function on most recent echocardiogram Shock, septic versus hypovolemic Plan: Continue NIPPV Broad-spectrum antibiotic for possible aspiration Received furosemide  in the emergency department. Start phenylephrine  to maintain a mean arterial pressure greater than 65. Discussed care with bedside RN.     Intervention Category Evaluation Type: New Patient Evaluation  Jerilynn Berg 02/14/2024, 1:04 AM

## 2024-02-14 NOTE — Progress Notes (Signed)
 PHARMACY NOTE:  ANTIMICROBIAL RENAL DOSAGE ADJUSTMENT  Current antimicrobial regimen includes a mismatch between antimicrobial dosage and estimated renal function.  As per policy approved by the Pharmacy & Therapeutics and Medical Executive Committees, the antimicrobial dosage will be adjusted accordingly.  Current antimicrobial dosage:  Cefepime  2g IV q12h  Indication: HCAP  Renal Function:  Estimated Creatinine Clearance: 64 mL/min (by C-G formula based on SCr of 1.01 mg/dL).   Antimicrobial dosage has been changed to:  Cefepime  2g IV q8h   Thank you for allowing pharmacy to be a part of this patient's care.  Azan Maneri, PharmD 02/14/2024 1:21 AM
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
## 2024-02-15 DIAGNOSIS — J690 Pneumonitis due to inhalation of food and vomit: Secondary | ICD-10-CM | POA: Diagnosis not present

## 2024-02-15 DIAGNOSIS — R1310 Dysphagia, unspecified: Secondary | ICD-10-CM

## 2024-02-15 DIAGNOSIS — E119 Type 2 diabetes mellitus without complications: Secondary | ICD-10-CM

## 2024-02-15 DIAGNOSIS — G9341 Metabolic encephalopathy: Secondary | ICD-10-CM | POA: Diagnosis not present

## 2024-02-15 DIAGNOSIS — G8250 Quadriplegia, unspecified: Secondary | ICD-10-CM | POA: Diagnosis not present

## 2024-02-15 DIAGNOSIS — J9601 Acute respiratory failure with hypoxia: Secondary | ICD-10-CM | POA: Diagnosis not present

## 2024-02-15 DIAGNOSIS — A419 Sepsis, unspecified organism: Secondary | ICD-10-CM | POA: Diagnosis not present

## 2024-02-15 DIAGNOSIS — N390 Urinary tract infection, site not specified: Secondary | ICD-10-CM | POA: Diagnosis not present

## 2024-02-15 DIAGNOSIS — E870 Hyperosmolality and hypernatremia: Secondary | ICD-10-CM | POA: Diagnosis not present

## 2024-02-15 DIAGNOSIS — J9602 Acute respiratory failure with hypercapnia: Secondary | ICD-10-CM | POA: Diagnosis not present

## 2024-02-15 DIAGNOSIS — E8809 Other disorders of plasma-protein metabolism, not elsewhere classified: Secondary | ICD-10-CM | POA: Diagnosis not present

## 2024-02-15 DIAGNOSIS — R6521 Severe sepsis with septic shock: Secondary | ICD-10-CM | POA: Diagnosis not present

## 2024-02-15 LAB — CBC
HCT: 24.8 % — ABNORMAL LOW (ref 39.0–52.0)
HCT: 24.6 % — ABNORMAL LOW (ref 39.0–52.0)
Hemoglobin: 7.4 g/dL — ABNORMAL LOW (ref 13.0–17.0)
Hemoglobin: 7.5 g/dL — ABNORMAL LOW (ref 13.0–17.0)
MCH: 29.8 pg (ref 26.0–34.0)
MCH: 30.1 pg (ref 26.0–34.0)
MCHC: 29.8 g/dL — ABNORMAL LOW (ref 30.0–36.0)
MCHC: 30.5 g/dL (ref 30.0–36.0)
MCV: 100 fL (ref 80.0–100.0)
MCV: 98.8 fL (ref 80.0–100.0)
Platelets: 251 10*3/uL (ref 150–400)
Platelets: 252 10*3/uL (ref 150–400)
RBC: 2.48 MIL/uL — ABNORMAL LOW (ref 4.22–5.81)
RBC: 2.49 MIL/uL — ABNORMAL LOW (ref 4.22–5.81)
RDW: 15.1 % (ref 11.5–15.5)
RDW: 15.3 % (ref 11.5–15.5)
WBC: 16.8 10*3/uL — ABNORMAL HIGH (ref 4.0–10.5)
WBC: 16.3 10*3/uL — ABNORMAL HIGH (ref 4.0–10.5)
nRBC: 0.2 % (ref 0.0–0.2)
nRBC: 0.2 % (ref 0.0–0.2)

## 2024-02-15 LAB — GLUCOSE, CAPILLARY
Glucose-Capillary: 106 mg/dL — ABNORMAL HIGH (ref 70–99)
Glucose-Capillary: 129 mg/dL — ABNORMAL HIGH (ref 70–99)
Glucose-Capillary: 68 mg/dL — ABNORMAL LOW (ref 70–99)
Glucose-Capillary: 78 mg/dL (ref 70–99)
Glucose-Capillary: 79 mg/dL (ref 70–99)
Glucose-Capillary: 85 mg/dL (ref 70–99)
Glucose-Capillary: 92 mg/dL (ref 70–99)
Glucose-Capillary: 94 mg/dL (ref 70–99)

## 2024-02-15 LAB — URINE CULTURE: Culture: >=100000 — AB

## 2024-02-15 LAB — BASIC METABOLIC PANEL WITH GFR
Anion gap: 12 (ref 5–15)
BUN: 36 mg/dL — ABNORMAL HIGH (ref 8–23)
CO2: 22 mmol/L (ref 22–32)
Calcium: 9 mg/dL (ref 8.9–10.3)
Chloride: 112 mmol/L — ABNORMAL HIGH (ref 98–111)
Creatinine, Ser: 0.98 mg/dL (ref 0.61–1.24)
GFR, Estimated: >60 mL/min
Glucose, Bld: 126 mg/dL — ABNORMAL HIGH (ref 70–99)
Potassium: 4.9 mmol/L (ref 3.5–5.1)
Sodium: 145 mmol/L (ref 135–145)

## 2024-02-15 LAB — MAGNESIUM: Magnesium: 2.2 mg/dL (ref 1.7–2.4)

## 2024-02-15 LAB — HEPARIN LEVEL (UNFRACTIONATED)
Heparin Unfractionated: 0.87 [IU]/mL — ABNORMAL HIGH (ref 0.30–0.70)
Heparin Unfractionated: 0.29 [IU]/mL — ABNORMAL LOW (ref 0.30–0.70)

## 2024-02-15 MED ORDER — DEXTROSE 50 % IV SOLN
12.5000 g | INTRAVENOUS | Status: AC
  Administered 2024-02-15: 12.5 g via INTRAVENOUS
  Filled 2024-02-15: qty 50

## 2024-02-15 MED ORDER — DEXTROSE 10 % IV SOLN: INTRAVENOUS | Status: DC

## 2024-02-15 MED ORDER — DEXTROSE 5 % IV SOLN: INTRAVENOUS | Status: AC

## 2024-02-15 MED ORDER — FUROSEMIDE 10 MG/ML IJ SOLN
40.0000 mg | Freq: Once | INTRAMUSCULAR | Status: AC
  Administered 2024-02-15: 40 mg via INTRAVENOUS
  Filled 2024-02-15: qty 4
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
## 2024-02-15 NOTE — Progress Notes (Signed)
 RT note. Meplex placed on patients nose at this time. Patient on current bipap settings.    02/15/24 0823  BiPAP/CPAP/SIPAP  BiPAP/CPAP/SIPAP Pt Type Adult  BiPAP/CPAP/SIPAP SERVO  Mask Type Full face mask  Mask Size Medium  Respiratory Rate 23 breaths/min  IPAP 12 cmH20  EPAP 6 cmH2O  PEEP 6 cmH20  FiO2 (%) (S)  50 %  Minute Ventilation 11.7  Leak 34  Peak Inspiratory Pressure (PIP) 12  Tidal Volume (Vt) 623  Patient Home Machine No  Patient Home Mask No  Patient Home Tubing No  Auto Titrate No  Press High Alarm 25 cmH2O  Press Low Alarm 5 cmH2O  CPAP/SIPAP surface wiped down Yes  Device Plugged into RED Power Outlet Yes  BiPAP/CPAP /SiPAP Vitals  Pulse Rate 94  Resp 17  SpO2 97 %  MEWS Score/Color  MEWS Score 2  MEWS Score Color Yellow
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
## 2024-02-15 NOTE — Plan of Care (Signed)
" °  Problem: Fluid Volume: Goal: Ability to maintain a balanced intake and output will improve Outcome: Not Progressing   Problem: Metabolic: Goal: Ability to maintain appropriate glucose levels will improve Outcome: Not Progressing   Problem: Nutritional: Goal: Maintenance of adequate nutrition will improve Outcome: Not Progressing Goal: Progress toward achieving an optimal weight will improve Outcome: Not Progressing   Problem: Clinical Measurements: Goal: Respiratory complications will improve Outcome: Not Progressing Goal: Cardiovascular complication will be avoided Outcome: Not Progressing   Problem: Activity: Goal: Risk for activity intolerance will decrease Outcome: Not Progressing   Problem: Nutrition: Goal: Adequate nutrition will be maintained Outcome: Not Progressing   "

## 2024-02-15 NOTE — TOC Initial Note (Signed)
 Transition of Care [facility]) - Initial/Assessment Note    Patient Details  Name: [name] [name] MRN: [id number] Date of Birth: 1946/01/07  Transition of Care [facility]) CM/SW Contact:    Sonda Manuella Quill, RN Phone Number: 02/15/2024, 4:11 PM  Clinical Narrative:                 Chart review show pt from [facility]; LVM for pt's dtr Thales Knipple [phone]); spoke w/ Madelin Jacobus, Admissions at facility; she said pt can return at d/c; he admitted to facility 02/08/24; D/C summary, FL2, and ins auth needed; awaiting return call from pt's dtr to complete IP CM assessment.  Expected Discharge Plan: Skilled Nursing Facility Barriers to Discharge: Continued Medical Work up   Patient Goals and CMS Choice            Expected Discharge Plan and Services   Discharge Planning Services: CM Consult   Living arrangements for the past 2 months: Skilled Nursing Facility                                      Prior Living Arrangements/Services Living arrangements for the past 2 months: Skilled Nursing Facility Lives with:: Facility Resident Patient language and need for interpreter reviewed:: Yes        Need for Family Participation in Patient Care: Yes (Comment) Care giver support system in place?: Yes (comment) Current home services:  (n/a) Criminal Activity/Legal Involvement Pertinent to Current Situation/Hospitalization: Yes - Comment as needed  Activities of Daily Living   ADL Screening (condition at time of admission) Independently performs ADLs?: No Does the patient have a NEW difficulty with bathing/dressing/toileting/self-feeding that is expected to last >3 days?: No Does the patient have a NEW difficulty with getting in/out of bed, walking, or climbing stairs that is expected to last >3 days?: No Does the patient have a NEW difficulty with communication that is expected to last >3 days?: No  Permission Sought/Granted      Share Information with NAME: Case  Manager     Permission granted to share info w Relationship: Dalon Reichart (dtr) [phone]     Emotional Assessment Appearance:: Other (Comment Required (unable to assess) Attitude/Demeanor/Rapport: Unable to Assess Affect (typically observed): Unable to Assess Orientation: :  (unable to assess) Alcohol  / Substance Use: Not Applicable Psych Involvement: No (comment)  Admission diagnosis:  Acute pulmonary edema (HCC) [J81.0] Acute encephalopathy [G93.40] Altered mental status, unspecified altered mental status type [R41.82] Sepsis, due to unspecified organism, unspecified whether acute organ dysfunction present [facility]) [A41.9] Patient Active Problem List   Diagnosis Date Noted   Altered mental status 02/14/2024   Acute pulmonary edema (HCC) 02/14/2024   Acute respiratory failure with hypoxia (HCC) 02/14/2024   Acute heart failure with preserved ejection fraction (HFpEF) (HCC) 02/14/2024   Acute encephalopathy 02/13/2024   Fecal occult blood test positive 11/12/2023   Acute stroke due to ischemia (HCC) 11/12/2023   Palliative care by specialist 11/06/2023   Protein-calorie malnutrition, severe 10/30/2023   Decubitus ulcer of sacral region, stage 4 (HCC) 10/26/2023   Catheter-associated urinary tract infection 10/25/2023   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   Renal tubular acidosis, 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   Gram-negative bacteremia    TIA (transient ischemic attack) 01/13/2021   Bilateral cellulitis of lower leg 01/13/2021   Gangrene of hand (HCC) 09/06/2020   Hypokalemia 09/06/2020   Hypomagnesemia 09/06/2020   Cellulitis of right  hand    Cellulitis of multiple sites 01/12/2020   Sepsis (HCC) 01/12/2020   COVID-19 virus infection 01/12/2020   Neurocognitive deficits 12/03/2017   Lobar pneumonia 11/30/2017   Anemia of chronic disease 11/30/2017   Open wound of right hand with complication 11/30/2017   Malnutrition of moderate degree 11/18/2017   Wounds, multiple 11/16/2017    Multiple wounds 11/16/2017   Paraplegia (HCC) 11/16/2017   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) 11/16/2017   Metabolic acidosis 09/26/2017   Pressure injury of skin 09/25/2017   Depression 04/08/2015   Essential hypertension    Diabetes mellitus without complication (HCC)    HLD (hyperlipidemia)    Iron  deficiency anemia    Acute on chronic anemia 03/17/2013   Hyperkalemia 02/27/2013   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HCC) 02/27/2013   Pressure ulcer of left heel 02/27/2013   Blister of foot 02/27/2013   Lower urinary tract infectious disease 02/21/2013   Ulcer of right ankle (HCC) 02/21/2013   PCP:  [name], MD Pharmacy:   [phone] - [place], [place] - [address] AT [facility] [address] [place] [address] Phone: [phone] Fax: [phone]     Social Drivers of Health (SDOH) Social History: SDOH Screenings   Food Insecurity: Patient Unable To Answer (12/05/2023)   Received from [facility]  Housing: Unknown (12/05/2023)   Received from [facility]  Transportation Needs: No Transportation Needs (01/29/2024)   Received from [facility]  Utilities: Patient Unable To Answer (12/05/2023)   Received from [facility]  Financial Resource Strain: Patient Unable To Answer (12/05/2023)   Received from [facility]  Social Connections: Patient Unable To Answer (12/05/2023)   Received from [facility]  Stress: No Stress Concern Present (01/29/2024)   Received from [facility]  Tobacco Use: Medium Risk (12/03/2023)   Received from [facility]   SDOH Interventions:     Readmission Risk Interventions     No data to display

## 2024-02-15 NOTE — Progress Notes (Signed)
 RT note. RT at bedside with MD and RN. Patient taken off BiPAP placed on [place], Patient NTS at this time. Moderate amount of secretions suctioned at this time. RT will continue to monitor.    02/15/24 0900  Therapy Vitals  Pulse Rate (!) 105  Resp 18  BP (!) 85/33  MEWS Score/Color  MEWS Score 3  MEWS Score Color Yellow  Respiratory Assessment  Assessment Type Assess only  Respiratory Pattern Regular;Unlabored  Chest Assessment Chest expansion symmetrical  Cough Congested;Weak  Bilateral Breath Sounds Diminished;Coarse crackles  Oxygen Therapy/Pulse Ox  O2 Device Nasal Cannula  SpO2 96 %  O2 Flow Rate (L/min) 3 L/min

## 2024-02-16 ENCOUNTER — Inpatient Hospital Stay (HOSPITAL_COMMUNITY)

## 2024-02-16 DIAGNOSIS — A419 Sepsis, unspecified organism: Secondary | ICD-10-CM | POA: Diagnosis not present

## 2024-02-16 DIAGNOSIS — E870 Hyperosmolality and hypernatremia: Secondary | ICD-10-CM | POA: Diagnosis not present

## 2024-02-16 DIAGNOSIS — E119 Type 2 diabetes mellitus without complications: Secondary | ICD-10-CM | POA: Diagnosis not present

## 2024-02-16 DIAGNOSIS — G9341 Metabolic encephalopathy: Secondary | ICD-10-CM | POA: Diagnosis not present

## 2024-02-16 DIAGNOSIS — J9601 Acute respiratory failure with hypoxia: Secondary | ICD-10-CM | POA: Diagnosis not present

## 2024-02-16 DIAGNOSIS — J690 Pneumonitis due to inhalation of food and vomit: Secondary | ICD-10-CM | POA: Diagnosis not present

## 2024-02-16 DIAGNOSIS — N390 Urinary tract infection, site not specified: Secondary | ICD-10-CM | POA: Diagnosis not present

## 2024-02-16 DIAGNOSIS — R1310 Dysphagia, unspecified: Secondary | ICD-10-CM | POA: Diagnosis not present

## 2024-02-16 DIAGNOSIS — G8250 Quadriplegia, unspecified: Secondary | ICD-10-CM | POA: Diagnosis not present

## 2024-02-16 DIAGNOSIS — R6521 Severe sepsis with septic shock: Secondary | ICD-10-CM | POA: Diagnosis not present

## 2024-02-16 DIAGNOSIS — J9602 Acute respiratory failure with hypercapnia: Secondary | ICD-10-CM | POA: Diagnosis not present

## 2024-02-16 DIAGNOSIS — E8809 Other disorders of plasma-protein metabolism, not elsewhere classified: Secondary | ICD-10-CM | POA: Diagnosis not present

## 2024-02-16 LAB — CBC
HCT: 21.2 % — ABNORMAL LOW (ref 39.0–52.0)
HCT: 21.6 % — ABNORMAL LOW (ref 39.0–52.0)
Hemoglobin: 6.7 g/dL — CL (ref 13.0–17.0)
Hemoglobin: 6.9 g/dL — CL (ref 13.0–17.0)
MCH: 29.9 pg (ref 26.0–34.0)
MCH: 30.1 pg (ref 26.0–34.0)
MCHC: 31.6 g/dL (ref 30.0–36.0)
MCHC: 31.9 g/dL (ref 30.0–36.0)
MCV: 94.6 fL (ref 80.0–100.0)
MCV: 94.3 fL (ref 80.0–100.0)
Platelets: 211 10*3/uL (ref 150–400)
Platelets: 200 10*3/uL (ref 150–400)
RBC: 2.24 MIL/uL — ABNORMAL LOW (ref 4.22–5.81)
RBC: 2.29 MIL/uL — ABNORMAL LOW (ref 4.22–5.81)
RDW: 15 % (ref 11.5–15.5)
RDW: 15.2 % (ref 11.5–15.5)
WBC: 10.9 10*3/uL — ABNORMAL HIGH (ref 4.0–10.5)
WBC: 10.4 10*3/uL (ref 4.0–10.5)
nRBC: 0.3 % — ABNORMAL HIGH (ref 0.0–0.2)
nRBC: 0.2 % (ref 0.0–0.2)

## 2024-02-16 LAB — GLUCOSE, CAPILLARY
Glucose-Capillary: 77 mg/dL (ref 70–99)
Glucose-Capillary: 76 mg/dL (ref 70–99)
Glucose-Capillary: 78 mg/dL (ref 70–99)
Glucose-Capillary: 73 mg/dL (ref 70–99)
Glucose-Capillary: 85 mg/dL (ref 70–99)

## 2024-02-16 LAB — PREPARE RBC (CROSSMATCH)

## 2024-02-16 LAB — HEPARIN LEVEL (UNFRACTIONATED): Heparin Unfractionated: 0.4 [IU]/mL (ref 0.30–0.70)

## 2024-02-16 MED ORDER — DEXTROSE 5 % IV SOLN: INTRAVENOUS | Status: AC

## 2024-02-16 MED ORDER — ENOXAPARIN SODIUM 40 MG/0.4ML IJ SOSY
40.0000 mg | PREFILLED_SYRINGE | INTRAMUSCULAR | Status: AC
  Administered 2024-02-17 – 2024-02-21 (×5): 40 mg via SUBCUTANEOUS
  Filled 2024-02-16 (×5): qty 0.4

## 2024-02-16 MED ORDER — SODIUM CHLORIDE 0.9% IV SOLUTION: Freq: Once | INTRAVENOUS | Status: DC

## 2024-02-16 MED ORDER — IOHEXOL 350 MG/ML SOLN
75.0000 mL | Freq: Once | INTRAVENOUS | Status: AC | PRN
  Administered 2024-02-16: 75 mL via INTRAVENOUS
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
## 2024-02-16 NOTE — Progress Notes (Signed)
" °   02/16/24 0313  BiPAP/CPAP/SIPAP  $ Non-Invasive Ventilator  Non-Invasive Vent Subsequent  BiPAP/CPAP/SIPAP Pt Type Adult  BiPAP/CPAP/SIPAP SERVO (Bipap not needed at this time)  Reason BIPAP/CPAP not in use Other(comment) (Not needed at this time)  BiPAP/CPAP /SiPAP Vitals  Pulse Rate 95  Resp 18  SpO2 99 %  MEWS Score/Color  MEWS Score 0  MEWS Score Color Green    "

## 2024-02-16 NOTE — Progress Notes (Signed)
 02/16/2024 H/H drifting down Hold empiric heparin  gtt 1 unit blood Recheck Noon
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
## 2024-02-17 DIAGNOSIS — G9340 Encephalopathy, unspecified: Secondary | ICD-10-CM | POA: Diagnosis not present

## 2024-02-17 LAB — GLUCOSE, CAPILLARY
Glucose-Capillary: 100 mg/dL — ABNORMAL HIGH (ref 70–99)
Glucose-Capillary: 102 mg/dL — ABNORMAL HIGH (ref 70–99)
Glucose-Capillary: 106 mg/dL — ABNORMAL HIGH (ref 70–99)
Glucose-Capillary: 87 mg/dL (ref 70–99)
Glucose-Capillary: 96 mg/dL (ref 70–99)

## 2024-02-17 LAB — TYPE AND SCREEN
ABO/RH(D): O POS
Antibody Screen: NEGATIVE
Unit division: 0

## 2024-02-17 LAB — CBC
HCT: 27 % — ABNORMAL LOW (ref 39.0–52.0)
Hemoglobin: 8.5 g/dL — ABNORMAL LOW (ref 13.0–17.0)
MCH: 29.6 pg (ref 26.0–34.0)
MCHC: 31.5 g/dL (ref 30.0–36.0)
MCV: 94.1 fL (ref 80.0–100.0)
Platelets: 218 10*3/uL (ref 150–400)
RBC: 2.87 MIL/uL — ABNORMAL LOW (ref 4.22–5.81)
RDW: 15.9 % — ABNORMAL HIGH (ref 11.5–15.5)
WBC: 10.1 10*3/uL (ref 4.0–10.5)
nRBC: 0.3 % — ABNORMAL HIGH (ref 0.0–0.2)

## 2024-02-17 LAB — BASIC METABOLIC PANEL WITH GFR
Anion gap: 12 (ref 5–15)
BUN: 29 mg/dL — ABNORMAL HIGH (ref 8–23)
CO2: 21 mmol/L — ABNORMAL LOW (ref 22–32)
Calcium: 9.2 mg/dL (ref 8.9–10.3)
Chloride: 108 mmol/L (ref 98–111)
Creatinine, Ser: 0.86 mg/dL (ref 0.61–1.24)
GFR, Estimated: >60 mL/min
Glucose, Bld: 89 mg/dL (ref 70–99)
Potassium: 4.2 mmol/L (ref 3.5–5.1)
Sodium: 140 mmol/L (ref 135–145)

## 2024-02-17 LAB — BPAM RBC
Blood Product Expiration Date: 202602022359
ISSUE DATE / TIME: 202602011823
Unit Type and Rh: 5100

## 2024-02-17 LAB — LEGIONELLA PNEUMOPHILA SEROGP 1 UR AG: L. pneumophila Serogp 1 Ur Ag: NEGATIVE

## 2024-02-17 MED ORDER — MIRTAZAPINE 15 MG PO TABS: 15.0000 mg | ORAL_TABLET | Freq: Every day | ORAL | Status: AC

## 2024-02-17 MED ORDER — FLUDROCORTISONE ACETATE 0.1 MG PO TABS
0.1000 mg | ORAL_TABLET | Freq: Two times a day (BID) | ORAL | Status: DC
  Filled 2024-02-17 (×4): qty 1

## 2024-02-17 MED ORDER — MEMANTINE HCL 5 MG PO TABS: 5.0000 mg | ORAL_TABLET | Freq: Every day | ORAL | Status: AC

## 2024-02-17 MED ORDER — PANTOPRAZOLE SODIUM 40 MG PO TBEC
40.0000 mg | DELAYED_RELEASE_TABLET | Freq: Every day | ORAL | Status: DC
  Filled 2024-02-17: qty 1

## 2024-02-17 MED ORDER — DEXTROSE 5 % IV SOLN: INTRAVENOUS | Status: AC

## 2024-02-17 MED ORDER — ATORVASTATIN CALCIUM 10 MG PO TABS: 20.0000 mg | ORAL_TABLET | Freq: Every day | ORAL | Status: DC

## 2024-02-17 MED ORDER — ASPIRIN 81 MG PO TBEC
81.0000 mg | DELAYED_RELEASE_TABLET | Freq: Every day | ORAL | Status: AC
  Filled 2024-02-17: qty 1

## 2024-02-17 NOTE — Plan of Care (Signed)
" °  Problem: Nutritional: Goal: Maintenance of adequate nutrition will improve Outcome: Not Progressing   Problem: Skin Integrity: Goal: Risk for impaired skin integrity will decrease Outcome: Not Progressing   Problem: Clinical Measurements: Goal: Respiratory complications will improve Outcome: Progressing   Problem: Nutrition: Goal: Adequate nutrition will be maintained Outcome: Not Progressing   Problem: Coping: Goal: Level of anxiety will decrease Outcome: Progressing   Problem: Pain Managment: Goal: General experience of comfort will improve and/or be controlled Outcome: Progressing   "

## 2024-02-18 ENCOUNTER — Encounter (HOSPITAL_COMMUNITY): Payer: Self-pay | Admitting: Pulmonary Disease

## 2024-02-18 ENCOUNTER — Other Ambulatory Visit: Payer: Self-pay

## 2024-02-18 ENCOUNTER — Inpatient Hospital Stay (HOSPITAL_COMMUNITY)

## 2024-02-18 DIAGNOSIS — G9340 Encephalopathy, unspecified: Secondary | ICD-10-CM | POA: Diagnosis not present

## 2024-02-18 LAB — GLUCOSE, CAPILLARY
Glucose-Capillary: 101 mg/dL — ABNORMAL HIGH (ref 70–99)
Glucose-Capillary: 102 mg/dL — ABNORMAL HIGH (ref 70–99)
Glucose-Capillary: 111 mg/dL — ABNORMAL HIGH (ref 70–99)
Glucose-Capillary: 116 mg/dL — ABNORMAL HIGH (ref 70–99)
Glucose-Capillary: 125 mg/dL — ABNORMAL HIGH (ref 70–99)
Glucose-Capillary: 151 mg/dL — ABNORMAL HIGH (ref 70–99)

## 2024-02-18 LAB — BASIC METABOLIC PANEL WITH GFR
Anion gap: 11 (ref 5–15)
BUN: 25 mg/dL — ABNORMAL HIGH (ref 8–23)
CO2: 21 mmol/L — ABNORMAL LOW (ref 22–32)
Calcium: 9.2 mg/dL (ref 8.9–10.3)
Chloride: 107 mmol/L (ref 98–111)
Creatinine, Ser: 0.79 mg/dL (ref 0.61–1.24)
GFR, Estimated: >60 mL/min
Glucose, Bld: 104 mg/dL — ABNORMAL HIGH (ref 70–99)
Potassium: 4 mmol/L (ref 3.5–5.1)
Sodium: 139 mmol/L (ref 135–145)

## 2024-02-18 LAB — CULTURE, BLOOD (ROUTINE X 2)
Culture: NO GROWTH
Special Requests: ADEQUATE

## 2024-02-18 LAB — CBC
HCT: 28.6 % — ABNORMAL LOW (ref 39.0–52.0)
Hemoglobin: 9 g/dL — ABNORMAL LOW (ref 13.0–17.0)
MCH: 29.4 pg (ref 26.0–34.0)
MCHC: 31.5 g/dL (ref 30.0–36.0)
MCV: 93.5 fL (ref 80.0–100.0)
Platelets: 221 10*3/uL (ref 150–400)
RBC: 3.06 MIL/uL — ABNORMAL LOW (ref 4.22–5.81)
RDW: 15.4 % (ref 11.5–15.5)
WBC: 10.2 10*3/uL (ref 4.0–10.5)
nRBC: 0.2 % (ref 0.0–0.2)

## 2024-02-18 LAB — BLOOD GAS, ARTERIAL
Acid-base deficit: 6.5 mmol/L — ABNORMAL HIGH (ref 0.0–2.0)
Bicarbonate: 19.7 mmol/L — ABNORMAL LOW (ref 20.0–28.0)
Drawn by: 422461
FIO2: 100 %
O2 Content: 15 L/min
O2 Saturation: 93.2 %
Patient temperature: 36.6
pCO2 arterial: 40 mmHg (ref 32–48)
pH, Arterial: 7.3 — ABNORMAL LOW (ref 7.35–7.45)
pO2, Arterial: 64 mmHg — ABNORMAL LOW (ref 83–108)

## 2024-02-18 MED ORDER — ACETAMINOPHEN 650 MG RE SUPP
325.0000 mg | RECTAL | Status: AC | PRN
  Administered 2024-02-18: 325 mg via RECTAL
  Filled 2024-02-18: qty 1

## 2024-02-18 MED ORDER — ORAL CARE MOUTH RINSE: 15.0000 mL | OROMUCOSAL | Status: AC | PRN

## 2024-02-18 NOTE — Plan of Care (Signed)
   Problem: Education: Goal: Ability to describe self-care measures that may prevent or decrease complications (Diabetes Survival Skills Education) will improve Outcome: Progressing Goal: Individualized Educational Video(s) Outcome: Progressing   Problem: Coping: Goal: Ability to adjust to condition or change in health will improve Outcome: Progressing

## 2024-02-18 NOTE — Progress Notes (Signed)
 Notified by patient's nurse that he has become quite tachycardic with heart rates in the 150s, increased work of breathing and use of accessory muscles and worsening hypoxemia prompting placement of 100% nonrebreather mask.  Due to excessive secretions patient was NT suctioned with significant improvement in work of breathing, O2 sats.  Upon my evaluation of the patient he was lethargic and would make brief eye contact.  He would not follow simple commands.  Lungs were coarse with expiratory rhonchi throughout all fields.  More congested in the upper airways.  Chest x-ray was obtained and demonstrated multifocal pneumonia worse on the right with right volume loss and rightward mediastinal shift with multifocal right lung opacities consistent with recurrent aspiration.  ABG on 100% nonrebreather: pH 7.3, PaO2 64, pCO2 40, ABE 6.5, HCO3 19.7.  I called to update the patient's daughter regarding change in status and need to move to stepdown unit.  Extensive conversation had regarding airway protection and concern over possible need for intubation.  Daughter expressed significant frustration over her fathers significant decline in status after discharge from LTAC to skilled nursing facility.  I informed her that we will be moving her father and will have critical care physician evaluate him.  She is very concerned that frequent suctioning of her father might lead to traumatic bleeding. Of note she requested that a physician evaluate her father and not a publishing rights manager.  At time of transfer patient secretions were increasing again, pulse was 102, O2 sats were 100 percent and he is on 4 L nasal cannula oxygen.

## 2024-02-18 NOTE — Progress Notes (Addendum)
 Patient RR 25-30 at this time with visible respiratory distress with use of accessory muscles, increased sound of secretions (gurgling) in airway, and gasping. O2 sat 95% on 3LNC. This nurse informed Dr. Lue, order for NTS placed, informed ICU RR and 5E charge nurse of possible need for BiPAP for respiratory distress.   RT to bedside. Patient visibly more comfortable, RR <25, O2 sat 93% on 3LNC. Informed charge nurse.

## 2024-02-18 NOTE — Progress Notes (Signed)
 This nurse held all PO AM meds at this time. Patient immediately aspirated on swallow assessment with thickened water  and again with a very small amount of applesauce. Unsafe to administer PO food/meds at this time. Informed Dr. Lue of these findings. SLP order placed.

## 2024-02-19 ENCOUNTER — Inpatient Hospital Stay (HOSPITAL_COMMUNITY): Admit: 2024-02-19 | Discharge: 2024-02-19 | Disposition: A | Attending: Nurse Practitioner

## 2024-02-19 DIAGNOSIS — R569 Unspecified convulsions: Secondary | ICD-10-CM

## 2024-02-19 DIAGNOSIS — J9601 Acute respiratory failure with hypoxia: Secondary | ICD-10-CM | POA: Diagnosis not present

## 2024-02-19 DIAGNOSIS — G8250 Quadriplegia, unspecified: Secondary | ICD-10-CM | POA: Diagnosis not present

## 2024-02-19 DIAGNOSIS — G9340 Encephalopathy, unspecified: Secondary | ICD-10-CM

## 2024-02-19 DIAGNOSIS — J9602 Acute respiratory failure with hypercapnia: Secondary | ICD-10-CM | POA: Diagnosis not present

## 2024-02-19 DIAGNOSIS — E8809 Other disorders of plasma-protein metabolism, not elsewhere classified: Secondary | ICD-10-CM | POA: Diagnosis not present

## 2024-02-19 DIAGNOSIS — R4182 Altered mental status, unspecified: Secondary | ICD-10-CM

## 2024-02-19 DIAGNOSIS — R1310 Dysphagia, unspecified: Secondary | ICD-10-CM | POA: Diagnosis not present

## 2024-02-19 DIAGNOSIS — E119 Type 2 diabetes mellitus without complications: Secondary | ICD-10-CM | POA: Diagnosis not present

## 2024-02-19 DIAGNOSIS — L89159 Pressure ulcer of sacral region, unspecified stage: Secondary | ICD-10-CM | POA: Diagnosis not present

## 2024-02-19 DIAGNOSIS — E870 Hyperosmolality and hypernatremia: Secondary | ICD-10-CM | POA: Diagnosis not present

## 2024-02-19 LAB — CULTURE, BLOOD (ROUTINE X 2): Culture: NO GROWTH

## 2024-02-19 LAB — CBC
HCT: 30.7 % — ABNORMAL LOW (ref 39.0–52.0)
Hemoglobin: 9.7 g/dL — ABNORMAL LOW (ref 13.0–17.0)
MCH: 29.4 pg (ref 26.0–34.0)
MCHC: 31.6 g/dL (ref 30.0–36.0)
MCV: 93 fL (ref 80.0–100.0)
Platelets: 206 10*3/uL (ref 150–400)
RBC: 3.3 MIL/uL — ABNORMAL LOW (ref 4.22–5.81)
RDW: 15.2 % (ref 11.5–15.5)
WBC: 12.9 10*3/uL — ABNORMAL HIGH (ref 4.0–10.5)
nRBC: 0.2 % (ref 0.0–0.2)

## 2024-02-19 LAB — AMMONIA: Ammonia: 16 umol/L (ref 9–35)

## 2024-02-19 LAB — BASIC METABOLIC PANEL WITH GFR
Anion gap: 14 (ref 5–15)
BUN: 27 mg/dL — ABNORMAL HIGH (ref 8–23)
CO2: 17 mmol/L — ABNORMAL LOW (ref 22–32)
Calcium: 8.9 mg/dL (ref 8.9–10.3)
Chloride: 106 mmol/L (ref 98–111)
Creatinine, Ser: 0.81 mg/dL (ref 0.61–1.24)
GFR, Estimated: >60 mL/min
Glucose, Bld: 122 mg/dL — ABNORMAL HIGH (ref 70–99)
Potassium: 4.2 mmol/L (ref 3.5–5.1)
Sodium: 137 mmol/L (ref 135–145)

## 2024-02-19 LAB — GLUCOSE, CAPILLARY
Glucose-Capillary: 102 mg/dL — ABNORMAL HIGH (ref 70–99)
Glucose-Capillary: 119 mg/dL — ABNORMAL HIGH (ref 70–99)
Glucose-Capillary: 65 mg/dL — ABNORMAL LOW (ref 70–99)
Glucose-Capillary: 73 mg/dL (ref 70–99)
Glucose-Capillary: 87 mg/dL (ref 70–99)
Glucose-Capillary: 99 mg/dL (ref 70–99)

## 2024-02-19 MED ORDER — VANCOMYCIN HCL IN DEXTROSE 1-5 GM/200ML-% IV SOLN: 1000.0000 mg | Freq: Two times a day (BID) | INTRAVENOUS | Status: DC

## 2024-02-19 MED ORDER — VANCOMYCIN HCL 1500 MG/300ML IV SOLN
1500.0000 mg | Freq: Once | INTRAVENOUS | Status: DC
  Filled 2024-02-19: qty 300

## 2024-02-19 MED ORDER — SODIUM CHLORIDE 0.9 % IV SOLN: 1.0000 g | Freq: Two times a day (BID) | INTRAVENOUS | Status: DC

## 2024-02-19 MED ORDER — VANCOMYCIN HCL 1500 MG/300ML IV SOLN
1500.0000 mg | Freq: Once | INTRAVENOUS | Status: AC
  Administered 2024-02-19: 1500 mg via INTRAVENOUS
  Filled 2024-02-19: qty 300

## 2024-02-19 MED ORDER — SODIUM CHLORIDE 0.9 % IV SOLN
2.0000 g | Freq: Three times a day (TID) | INTRAVENOUS | Status: AC
  Administered 2024-02-19 – 2024-02-21 (×7): 2 g via INTRAVENOUS
  Filled 2024-02-19 (×7): qty 12.5

## 2024-02-19 MED ORDER — VANCOMYCIN HCL IN DEXTROSE 1-5 GM/200ML-% IV SOLN
1000.0000 mg | Freq: Two times a day (BID) | INTRAVENOUS | Status: DC
  Administered 2024-02-20: 1000 mg via INTRAVENOUS
  Filled 2024-02-19: qty 200

## 2024-02-19 NOTE — Progress Notes (Signed)
 EEG complete - results pending

## 2024-02-19 NOTE — Procedures (Signed)
 Patient Name: [name] [name]  MRN: [id number]  Epilepsy Attending: Arlin MALVA Krebs  Referring Physician/Provider: [place] Isaiah CROME, NP  Date: 02/19/2024 Duration: 23.42 mins  Patient history: 79yo M with ams. EEG to evaluate for seizure  Level of alertness: Awake  AEDs during EEG study: None  Technical aspects: This EEG study was done with scalp electrodes positioned according to the 10-20 International system of electrode placement. Electrical activity was reviewed with band pass filter of 1-70Hz , sensitivity of 7 uV/mm, display speed of 1mm/sec with a 60Hz  notched filter applied as appropriate. EEG data were recorded continuously and digitally stored.  Video monitoring was available and reviewed as appropriate.  Description: EEG showed continuous generalized 3 to 6 Hz theta-delta slowing. Hyperventilation and photic stimulation were not performed.     Study was technically difficult due to significant chewing and myogenic artifact  ABNORMALITY - Continuous slow, generalized  IMPRESSION: This technically difficult study is suggestive of generalized cerebral dysfunction (encephalopathy). No seizures or epileptiform discharges were seen throughout the recording.  Janisha Bueso O Aston Lieske

## 2024-02-19 NOTE — Plan of Care (Signed)
" °  Problem: Nutritional: Goal: Maintenance of adequate nutrition will improve Outcome: Not Progressing Goal: Progress toward achieving an optimal weight will improve Outcome: Not Progressing   Problem: Skin Integrity: Goal: Risk for impaired skin integrity will decrease Outcome: Not Progressing   Problem: Tissue Perfusion: Goal: Adequacy of tissue perfusion will improve Outcome: Progressing   Problem: Activity: Goal: Risk for activity intolerance will decrease Outcome: Not Progressing   Problem: Nutrition: Goal: Adequate nutrition will be maintained Outcome: Not Progressing  Unable to safely take oral medications. Requiring frequent mouth care and suctioning    "
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
## 2024-02-19 NOTE — Progress Notes (Signed)
 Pharmacy Antibiotic Note  [name] [name] is a 79 y.o. male admitted on 02/13/2024 with multifocal PNA. Patient completed a 6 day course of Cefepime  yesterday. However, patient still with ongoing concerns for PNA and worsening respiratory status on 2/3. Noted high risk for deterioration and not a candidate for intubation. Thus, pharmacy has been re- consulted for Cefepime  and Vancomycin  dosing. - renal fxn stable, WBC up to 12.9, afebrile  Plan: Vancomycin  1500mg  IV x1, followed by 1000mg  IV q12h (eAUC 505.6, Scr 0.81, Vd 0.72) Cefepime  2g IV q8h  Monitor clinical/respiratory status, renal function  Follow up MRSA PCR for de-escalation plans   Height: 6' (182.9 cm) Weight: 75.4 kg (166 lb 3.6 oz) IBW/kg (Calculated) : 77.6  Temp (24hrs), Avg:97.8 F (36.6 C), Min:97.5 F (36.4 C), Max:98.3 F (36.8 C)  Recent Labs  Lab 02/13/24 2000 02/13/24 2009 02/15/24 0305 02/15/24 0559 02/16/24 0509 02/16/24 1300 02/17/24 0512 02/18/24 0559 02/19/24 0336  WBC 10.3  --  16.8*   < > 10.9* 10.4 10.1 10.2 12.9*  CREATININE 1.01  --  0.98  --   --   --  0.86 0.79 0.81  LATICACIDVEN  --  0.9  --   --   --   --   --   --   --    < > = values in this interval not displayed.    Estimated Creatinine Clearance: 80.2 mL/min (by C-G formula based on SCr of 0.81 mg/dL).    Allergies[1]  Antimicrobials this admission: 1/29  Cefepime  >> 2/3; 2/4 >> 1/29 vanc >> 1/30; 2/4 >>   Dose adjustments this admission:   Microbiology results: 1/30 BCx: ngtdF 1/30 MRSA PCR: not detected 1/29 resp panel: neg 1/29 UCx: >100k proteus - R NTF 2/4 MRSA PCR:   Thank you for allowing pharmacy to be a part of this patients care.  Irish Piech 02/19/2024 2:03 PM     [1]  Allergies Allergen Reactions   Baclofen Other (See Comments)    Left shoulder twitching/jerking   Lisinopril  Other (See Comments)    Hyperkalemia (a high level of the electrolyte potassium in the blood)
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
## 2024-02-20 DIAGNOSIS — I5189 Other ill-defined heart diseases: Secondary | ICD-10-CM | POA: Diagnosis not present

## 2024-02-20 DIAGNOSIS — R532 Functional quadriplegia: Secondary | ICD-10-CM | POA: Diagnosis not present

## 2024-02-20 DIAGNOSIS — G9341 Metabolic encephalopathy: Secondary | ICD-10-CM | POA: Diagnosis not present

## 2024-02-20 DIAGNOSIS — Z8673 Personal history of transient ischemic attack (TIA), and cerebral infarction without residual deficits: Secondary | ICD-10-CM | POA: Diagnosis not present

## 2024-02-20 DIAGNOSIS — E46 Unspecified protein-calorie malnutrition: Secondary | ICD-10-CM

## 2024-02-20 DIAGNOSIS — L89159 Pressure ulcer of sacral region, unspecified stage: Secondary | ICD-10-CM | POA: Diagnosis not present

## 2024-02-20 DIAGNOSIS — E8809 Other disorders of plasma-protein metabolism, not elsewhere classified: Secondary | ICD-10-CM | POA: Diagnosis not present

## 2024-02-20 DIAGNOSIS — J9601 Acute respiratory failure with hypoxia: Secondary | ICD-10-CM | POA: Diagnosis not present

## 2024-02-20 DIAGNOSIS — E119 Type 2 diabetes mellitus without complications: Secondary | ICD-10-CM | POA: Diagnosis not present

## 2024-02-20 LAB — GLUCOSE, CAPILLARY
Glucose-Capillary: 103 mg/dL — ABNORMAL HIGH (ref 70–99)
Glucose-Capillary: 103 mg/dL — ABNORMAL HIGH (ref 70–99)
Glucose-Capillary: 105 mg/dL — ABNORMAL HIGH (ref 70–99)
Glucose-Capillary: 109 mg/dL — ABNORMAL HIGH (ref 70–99)
Glucose-Capillary: 118 mg/dL — ABNORMAL HIGH (ref 70–99)
Glucose-Capillary: 66 mg/dL — ABNORMAL LOW (ref 70–99)
Glucose-Capillary: 91 mg/dL (ref 70–99)
Glucose-Capillary: 92 mg/dL (ref 70–99)

## 2024-02-20 LAB — CBC
HCT: 26.6 % — ABNORMAL LOW (ref 39.0–52.0)
Hemoglobin: 8.4 g/dL — ABNORMAL LOW (ref 13.0–17.0)
MCH: 29.4 pg (ref 26.0–34.0)
MCHC: 31.6 g/dL (ref 30.0–36.0)
MCV: 93 fL (ref 80.0–100.0)
Platelets: 222 10*3/uL (ref 150–400)
RBC: 2.86 MIL/uL — ABNORMAL LOW (ref 4.22–5.81)
RDW: 15 % (ref 11.5–15.5)
WBC: 10.7 10*3/uL — ABNORMAL HIGH (ref 4.0–10.5)
nRBC: 0.2 % (ref 0.0–0.2)

## 2024-02-20 LAB — COMPREHENSIVE METABOLIC PANEL WITH GFR
ALT: 8 U/L (ref 0–44)
AST: 16 U/L (ref 15–41)
Albumin: 2.6 g/dL — ABNORMAL LOW (ref 3.5–5.0)
Alkaline Phosphatase: 93 U/L (ref 38–126)
Anion gap: 13 (ref 5–15)
BUN: 27 mg/dL — ABNORMAL HIGH (ref 8–23)
CO2: 18 mmol/L — ABNORMAL LOW (ref 22–32)
Calcium: 8.7 mg/dL — ABNORMAL LOW (ref 8.9–10.3)
Chloride: 110 mmol/L (ref 98–111)
Creatinine, Ser: 0.77 mg/dL (ref 0.61–1.24)
GFR, Estimated: >60 mL/min
Glucose, Bld: 181 mg/dL — ABNORMAL HIGH (ref 70–99)
Potassium: 4 mmol/L (ref 3.5–5.1)
Sodium: 141 mmol/L (ref 135–145)
Total Bilirubin: 0.4 mg/dL (ref 0.0–1.2)
Total Protein: 7.1 g/dL (ref 6.5–8.1)

## 2024-02-20 MED ORDER — AMLODIPINE BESYLATE 5 MG PO TABS: 5.0000 mg | ORAL_TABLET | Freq: Every day | ORAL | Status: DC

## 2024-02-20 MED ORDER — DEXTROSE 50 % IV SOLN
12.5000 g | INTRAVENOUS | Status: AC
  Administered 2024-02-20: 12.5 g via INTRAVENOUS
  Filled 2024-02-20: qty 50

## 2024-02-20 MED ORDER — DEXTROSE 10 % IV SOLN: INTRAVENOUS | Status: AC

## 2024-02-20 MED ORDER — HYDRALAZINE HCL 20 MG/ML IJ SOLN
10.0000 mg | Freq: Four times a day (QID) | INTRAMUSCULAR | Status: DC | PRN
  Administered 2024-02-20 – 2024-02-21 (×2): 10 mg via INTRAVENOUS
  Filled 2024-02-20 (×2): qty 1

## 2024-02-20 MED ORDER — FUROSEMIDE 10 MG/ML IJ SOLN
20.0000 mg | Freq: Every day | INTRAMUSCULAR | Status: DC
  Administered 2024-02-20 – 2024-02-21 (×2): 20 mg via INTRAVENOUS
  Filled 2024-02-20 (×2): qty 2
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
## 2024-02-20 NOTE — Progress Notes (Signed)
 Hypoglycemic Event  CBG: 65   Treatment: D50 25 mL (12.5 gm)  Symptoms: None  Follow-up CBG: Time:0029 CBG Result:105  Possible Reasons for Event: Inadequate meal intake     [name] [name]

## 2024-02-20 NOTE — Progress Notes (Signed)
 Hypoglycemic Event  CBG: 66   Treatment: D50 25 mL (12.5 gm)  Symptoms: None  Follow-up CBG: Time:400  CBG Result:103   Possible Reasons for Event: Inadequate meal intake      [name] [name] [place]

## 2024-02-21 ENCOUNTER — Inpatient Hospital Stay (HOSPITAL_COMMUNITY)

## 2024-02-21 DIAGNOSIS — R0602 Shortness of breath: Secondary | ICD-10-CM

## 2024-02-21 DIAGNOSIS — Z7189 Other specified counseling: Secondary | ICD-10-CM

## 2024-02-21 DIAGNOSIS — R579 Shock, unspecified: Secondary | ICD-10-CM

## 2024-02-21 LAB — GLUCOSE, CAPILLARY
Glucose-Capillary: 113 mg/dL — ABNORMAL HIGH (ref 70–99)
Glucose-Capillary: 138 mg/dL — ABNORMAL HIGH (ref 70–99)
Glucose-Capillary: 116 mg/dL — ABNORMAL HIGH (ref 70–99)
Glucose-Capillary: 161 mg/dL — ABNORMAL HIGH (ref 70–99)
Glucose-Capillary: 142 mg/dL — ABNORMAL HIGH (ref 70–99)

## 2024-02-21 LAB — CBC WITH DIFFERENTIAL/PLATELET
Abs Immature Granulocytes: 0.1 10*3/uL — ABNORMAL HIGH (ref 0.00–0.07)
Basophils Absolute: 0 10*3/uL (ref 0.0–0.1)
Basophils Relative: 0 %
Eosinophils Absolute: 0 10*3/uL (ref 0.0–0.5)
Eosinophils Relative: 0 %
HCT: 30.9 % — ABNORMAL LOW (ref 39.0–52.0)
Hemoglobin: 9.4 g/dL — ABNORMAL LOW (ref 13.0–17.0)
Immature Granulocytes: 1 %
Lymphocytes Relative: 10 %
Lymphs Abs: 1.9 10*3/uL (ref 0.7–4.0)
MCH: 29.2 pg (ref 26.0–34.0)
MCHC: 30.4 g/dL (ref 30.0–36.0)
MCV: 96 fL (ref 80.0–100.0)
Monocytes Absolute: 1.1 10*3/uL — ABNORMAL HIGH (ref 0.1–1.0)
Monocytes Relative: 6 %
Neutro Abs: 16 10*3/uL — ABNORMAL HIGH (ref 1.7–7.7)
Neutrophils Relative %: 83 %
Platelets: 326 10*3/uL (ref 150–400)
RBC: 3.22 MIL/uL — ABNORMAL LOW (ref 4.22–5.81)
RDW: 15.2 % (ref 11.5–15.5)
WBC: 19.2 10*3/uL — ABNORMAL HIGH (ref 4.0–10.5)
nRBC: 0.2 % (ref 0.0–0.2)

## 2024-02-21 LAB — PRO BRAIN NATRIURETIC PEPTIDE: Pro Brain Natriuretic Peptide: 9075 pg/mL — ABNORMAL HIGH

## 2024-02-21 LAB — POCT I-STAT 7, (LYTES, BLD GAS, ICA,H+H)
Acid-base deficit: 12 mmol/L — ABNORMAL HIGH (ref 0.0–2.0)
Bicarbonate: 13.9 mmol/L — ABNORMAL LOW (ref 20.0–28.0)
Calcium, Ion: 1.24 mmol/L (ref 1.15–1.40)
HCT: 30 % — ABNORMAL LOW (ref 39.0–52.0)
Hemoglobin: 10.2 g/dL — ABNORMAL LOW (ref 13.0–17.0)
O2 Saturation: 96 %
Potassium: 3.8 mmol/L (ref 3.5–5.1)
Sodium: 141 mmol/L (ref 135–145)
TCO2: 15 mmol/L — ABNORMAL LOW (ref 22–32)
pCO2 arterial: 30.8 mmHg — ABNORMAL LOW (ref 32–48)
pH, Arterial: 7.262 — ABNORMAL LOW (ref 7.35–7.45)
pO2, Arterial: 94 mmHg (ref 83–108)

## 2024-02-21 LAB — COMPREHENSIVE METABOLIC PANEL WITH GFR
ALT: 8 U/L (ref 0–44)
AST: 15 U/L (ref 15–41)
Albumin: 3 g/dL — ABNORMAL LOW (ref 3.5–5.0)
Alkaline Phosphatase: 104 U/L (ref 38–126)
Anion gap: 19 — ABNORMAL HIGH (ref 5–15)
BUN: 28 mg/dL — ABNORMAL HIGH (ref 8–23)
CO2: 13 mmol/L — ABNORMAL LOW (ref 22–32)
Calcium: 9 mg/dL (ref 8.9–10.3)
Chloride: 107 mmol/L (ref 98–111)
Creatinine, Ser: 0.93 mg/dL (ref 0.61–1.24)
GFR, Estimated: >60 mL/min
Glucose, Bld: 201 mg/dL — ABNORMAL HIGH (ref 70–99)
Potassium: 3.9 mmol/L (ref 3.5–5.1)
Sodium: 139 mmol/L (ref 135–145)
Total Bilirubin: 0.4 mg/dL (ref 0.0–1.2)
Total Protein: 7.8 g/dL (ref 6.5–8.1)

## 2024-02-21 LAB — CG4 I-STAT (LACTIC ACID): Lactic Acid, Venous: 1.2 mmol/L (ref 0.5–1.9)

## 2024-02-21 MED ORDER — HYDROCORTISONE SOD SUC (PF) 100 MG IJ SOLR
100.0000 mg | Freq: Three times a day (TID) | INTRAMUSCULAR | Status: AC
  Administered 2024-02-21: 100 mg via INTRAVENOUS
  Filled 2024-02-21: qty 2

## 2024-02-21 MED ORDER — LACTATED RINGERS IV BOLUS
500.0000 mL | Freq: Once | INTRAVENOUS | Status: AC
  Administered 2024-02-21: 500 mL via INTRAVENOUS

## 2024-02-21 MED ORDER — SCOPOLAMINE 1 MG/3DAYS TD PT72
1.0000 | MEDICATED_PATCH | TRANSDERMAL | Status: AC
  Administered 2024-02-21: 1 mg via TRANSDERMAL
  Filled 2024-02-21: qty 1

## 2024-02-21 MED ORDER — SODIUM CHLORIDE 0.9 % IV SOLN: INTRAVENOUS | Status: AC | PRN

## 2024-02-21 MED ORDER — METHYLPREDNISOLONE SODIUM SUCC 125 MG IJ SOLR
90.0000 mg | Freq: Two times a day (BID) | INTRAMUSCULAR | Status: DC
  Administered 2024-02-21: 90 mg via INTRAVENOUS
  Filled 2024-02-21: qty 2

## 2024-02-21 MED ORDER — NOREPINEPHRINE 16 MG/250ML-% IV SOLN
0.0000 ug/min | INTRAVENOUS | Status: AC
  Administered 2024-02-21: 15 ug/min via INTRAVENOUS
  Filled 2024-02-21: qty 250

## 2024-02-21 MED ORDER — ONDANSETRON HCL 4 MG/2ML IJ SOLN
4.0000 mg | Freq: Four times a day (QID) | INTRAMUSCULAR | Status: AC | PRN
  Administered 2024-02-21: 4 mg via INTRAVENOUS
  Filled 2024-02-21: qty 2

## 2024-02-21 MED ORDER — NOREPINEPHRINE 4 MG/250ML-% IV SOLN: 0.0000 ug/min | INTRAVENOUS | Status: DC

## 2024-02-21 MED ORDER — PANTOPRAZOLE SODIUM 40 MG IV SOLR
40.0000 mg | INTRAVENOUS | Status: AC
  Administered 2024-02-21: 40 mg via INTRAVENOUS
  Filled 2024-02-21: qty 10

## 2024-02-21 MED ORDER — SODIUM CHLORIDE 0.9 % IV SOLN: 250.0000 mL | INTRAVENOUS | Status: AC

## 2024-02-21 MED ORDER — SODIUM BICARBONATE 8.4 % IV SOLN
100.0000 meq | Freq: Once | INTRAVENOUS | Status: AC
  Administered 2024-02-21: 100 meq via INTRAVENOUS

## 2024-02-21 MED ORDER — AYR SALINE NASAL NA GEL
1.0000 | NASAL | Status: AC | PRN
  Filled 2024-02-21: qty 14.1

## 2024-02-21 MED ORDER — POLYVINYL ALCOHOL 1.4 % OP SOLN: 1.0000 [drp] | Freq: Four times a day (QID) | OPHTHALMIC | Status: AC | PRN

## 2024-02-21 MED ORDER — GLYCOPYRROLATE 0.2 MG/ML IJ SOLN
0.2000 mg | INTRAMUSCULAR | Status: AC | PRN
  Administered 2024-02-21: 0.2 mg via INTRAVENOUS
  Filled 2024-02-21: qty 1

## 2024-02-21 MED ORDER — NOREPINEPHRINE 4 MG/250ML-% IV SOLN
INTRAVENOUS | Status: AC
  Administered 2024-02-21: 5 ug/min via INTRAVENOUS
  Filled 2024-02-21: qty 250

## 2024-02-21 MED ORDER — MORPHINE SULFATE (PF) 2 MG/ML IV SOLN
1.0000 mg | INTRAVENOUS | Status: AC | PRN
  Administered 2024-02-21: 2 mg via INTRAVENOUS
  Filled 2024-02-21: qty 1

## 2024-02-21 MED ORDER — ONDANSETRON 4 MG PO TBDP: 4.0000 mg | ORAL_TABLET | Freq: Four times a day (QID) | ORAL | Status: AC | PRN

## 2024-02-21 MED ORDER — SODIUM BICARBONATE 8.4 % IV SOLN
INTRAVENOUS | Status: AC
  Filled 2024-02-21: qty 100

## 2024-02-21 MED ORDER — SODIUM CHLORIDE 0.9 % IV BOLUS
500.0000 mL | Freq: Once | INTRAVENOUS | Status: AC
  Administered 2024-02-21: 500 mL via INTRAVENOUS

## 2024-02-21 MED ORDER — VASOPRESSIN 20 UNITS/100 ML INFUSION FOR SHOCK: 0.0300 [IU]/min | INTRAVENOUS | Status: DC

## 2024-02-21 MED ORDER — METHYLPREDNISOLONE SODIUM SUCC 125 MG IJ SOLR: 60.0000 mg | Freq: Three times a day (TID) | INTRAMUSCULAR | Status: DC

## 2024-02-21 NOTE — Progress Notes (Signed)
 Upon initial assessment, shortly after 1300, patient was noted to be increasingly hypotensive. Dr Mickle notified by this RN and orders received for peripheral levophed . MD also made aware of patient's increased O2 needs, as patient was up to 8L HFNC from RA.   CCM was consulted and spoke with patient's daughter at bedside. Arterial and central line was emergently placed by CCM since patient was maxed on peripheral levophed .   Following central line placement, Dr Jeryl joined CCM at bedside, and thoroughly discussed goals of care with patient's family. Patient's daughter [name] consented to focusing on patient's comfort at this time, and transitioning away from aggressive measures.

## 2024-02-21 NOTE — IPAL (Addendum)
 Advance Care Planning Note  Participants: Patients daughter and wife present at bedside; discussion held jointly with Critical Care team NP colleague and ICU RN.   A detailed goals-of-care discussion was held with the patients wife and daughter regarding the patients current condition and prognosis. It was explained that he remains critically ill with progressive respiratory failure in the setting of chronic quadriplegia, recurrent aspiration, copious secretions, and profound baseline debility. The physiology of aspiration and secretion burden was reviewed in detail, including the inability to protect his airway and the resulting respiratory distress. Family was informed that the patient is exhibiting clear non-verbal signs of distress, including moaning, wincing, grimacing, and gurgling respiratory secretions.  It was discussed that the patient remains hemodynamically unstable, requiring vasopressor support with persistent hypotension and tachycardia, indicating a markedly limited prognosis despite maximal medical therapy. The critical care team explained that in the event of cardiopulmonary arrest, resuscitative efforts including CPR and intubation would be unlikely to restore meaningful recovery and would likely prolong suffering while causing more harm and discomfort.   Options including continuation of full code status versus transition to DNR/DNI and comfort-focused care were reviewed. The role of comfort-directed measures was discussed, including judicious, continuous use of opioids for relief of dyspnea and distress, aggressive secretion management, and a focus on comfort and dignity. The distinction between comfort care and abandonment of treatment was clearly clarified.  Spiritual support resources were discussed, and chaplain services were offered and made available to the family. The wife and daughter were given time to ask questions and express concerns. The palliative team emphasized  ongoing support and availability as the family considers next steps.  Plan: [place]  IV PRN and Scopolamine  patch for secretions.  Establish DNR DNI comfort measures Allow time and space for additional family to come visit.  Initiate opioids for comfort Discontinue artificial measures such as vasopressor support.   Time in 4:35 PM Time out 5:05 PM  Lonia Serve MD [facility].
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
## 2024-02-21 NOTE — Progress Notes (Addendum)
 Arterial Catheter Insertion Procedure Note  [name] [name]  [id number]  31-Mar-1945  Date:02/21/24  Time:5:38 PM    Provider Performing: Fonda JAYSON Sharps    Procedure: Insertion of Arterial Line (63379) with US  guidance (23062)   Indication(s) Blood pressure monitoring and/or need for frequent ABGs  Consent Unable to obtain consent due to emergent nature of procedure.  Anesthesia None   Time Out Verified patient identification, verified procedure, site/side was marked, verified correct patient position, special equipment/implants available, medications/allergies/relevant history reviewed, required imaging and test results available.   Sterile Technique Maximal sterile technique including full sterile barrier drape, hand hygiene, sterile gown, sterile gloves, mask, hair covering, sterile ultrasound probe cover (if used).   Procedure Description Area of catheter insertion was cleaned with chlorhexidine  and draped in sterile fashion. With real-time ultrasound guidance an arterial catheter was placed into the right radial artery.  Appropriate arterial tracings confirmed on monitor.     Complications/Tolerance None; patient tolerated the procedure well.   EBL Minimal   Specimen(s)  Sherlean Sharps AGACNP-BC   [facility] 02/21/2024, 5:40 PM  Please see [email or web address] for pager details.  From 7A-7P if no response, please call [phone]. After hours, please call ELink [phone].

## 2024-02-21 NOTE — Procedures (Signed)
 Central Venous Catheter Insertion Procedure Note  JOELLE FLESSNER  [id number]  01-14-1946  Date:02/21/24  Time:5:37 PM   Provider Performing:Jonathin Heinicke JAYSON Sharps   Procedure: Insertion of Non-tunneled Central Venous Catheter(36556) with US  guidance (23062)   Indication(s) Medication administration, Difficult access, and Hemodialysis  Consent Risks of the procedure as well as the alternatives and risks of each were explained to the patient and/or caregiver.  Consent for the procedure was obtained and is signed in the bedside chart  Anesthesia Topical only with 1% lidocaine    Timeout Verified patient identification, verified procedure, site/side was marked, verified correct patient position, special equipment/implants available, medications/allergies/relevant history reviewed, required imaging and test results available.  Sterile Technique Maximal sterile technique including full sterile barrier drape, hand hygiene, sterile gown, sterile gloves, mask, hair covering, sterile ultrasound probe cover (if used).  Procedure Description Area of catheter insertion was cleaned with chlorhexidine  and draped in sterile fashion.  With real-time ultrasound guidance a central venous catheter was placed into the right femoral vein. Nonpulsatile blood flow and easy flushing noted in all ports.  The catheter was sutured in place and sterile dressing applied.  Complications/Tolerance None; patient tolerated the procedure well. Chest X-ray is ordered to verify placement for internal jugular or subclavian cannulation.   Chest x-ray is not ordered for femoral cannulation.  EBL Minimal  Specimen(s) None   Sherlean Sharps AGACNP-BC   [facility] 02/21/2024, 5:37 PM  Please see [email or web address] for pager details.  From 7A-7P if no response, please call [phone]. After hours, please call ELink [phone].

## 2024-02-21 NOTE — Plan of Care (Signed)
   Problem: Coping: Goal: Level of anxiety will decrease Outcome: Progressing   Problem: Pain Managment: Goal: General experience of comfort will improve and/or be controlled Outcome: Progressing

## 2024-02-21 NOTE — TOC Progression Note (Signed)
 Transition of Care [facility]) - Progression Note    Patient Details  Name: [name] [name] MRN: [id number] Date of Birth: Nov 26, 1945  [facility] CM/SW Contact  Sonda Manuella Quill, RN Phone Number: 02/21/2024, 2:59 PM  Clinical Narrative:    Pt from [facility]; LVM x 2 for pt's dtr [name] Bring [phone]) to discuss d/c plan; awaiting return call.   Expected Discharge Plan: Skilled Nursing Facility Barriers to Discharge: Continued Medical Work up               Expected Discharge Plan and Services   Discharge Planning Services: CM Consult   Living arrangements for the past 2 months: Skilled Nursing Facility                                       Social Drivers of Health (SDOH) Interventions SDOH Screenings   Food Insecurity: Patient Unable To Answer (12/05/2023)   Received from [facility]  Housing: Unknown (12/05/2023)   Received from [facility]  Transportation Needs: No Transportation Needs (01/29/2024)   Received from [facility]  Utilities: Patient Unable To Answer (12/05/2023)   Received from [facility]  Financial Resource Strain: Patient Unable To Answer (12/05/2023)   Received from [facility]  Social Connections: Patient Unable To Answer (12/05/2023)   Received from [facility]  Stress: No Stress Concern Present (01/29/2024)   Received from [facility]  Tobacco Use: Medium Risk (02/18/2024)    Readmission Risk Interventions     No data to display

## 2024-02-21 NOTE — Progress Notes (Signed)
 Rt unable to get an ABG
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
## 2024-02-21 NOTE — Progress Notes (Signed)
" °   02/21/24 1920  Spiritual Encounters  Type of Visit Initial  Care provided to: Pt and family  Conversation partners present during encounter Nurse  Reason for visit Urgent spiritual support  OnCall Visit Yes   Responded to request for chaplain by family (granddaughter) Patient moved to comfort care. Present was daughter and two granddaughters at bedside. Provided prayer and support.   "

## 2024-02-21 NOTE — TOC Progression Note (Signed)
 Transition of Care [facility]) - Progression Note    Patient Details  Name: [name] [name] MRN: [id number] Date of Birth: Jan 30, 1945  Transition of Care [facility]) CM/SW Contact  Sonda Manuella Quill, RN Phone Number: 02/21/2024, 2:07 PM  Clinical Narrative:    Pt not medically ready for d/c; on hi-flow Colon; TBD SNF vs long-term care; IP CM is following.   Expected Discharge Plan: Skilled Nursing Facility Barriers to Discharge: Continued Medical Work up               Expected Discharge Plan and Services   Discharge Planning Services: CM Consult   Living arrangements for the past 2 months: Skilled Nursing Facility                                       Social Drivers of Health (SDOH) Interventions SDOH Screenings   Food Insecurity: Patient Unable To Answer (12/05/2023)   Received from [facility]  Housing: Unknown (12/05/2023)   Received from [facility]  Transportation Needs: No Transportation Needs (01/29/2024)   Received from [facility]  Utilities: Patient Unable To Answer (12/05/2023)   Received from [facility]  Financial Resource Strain: Patient Unable To Answer (12/05/2023)   Received from [facility]  Social Connections: Patient Unable To Answer (12/05/2023)   Received from [facility]  Stress: No Stress Concern Present (01/29/2024)   Received from [facility]  Tobacco Use: Medium Risk (02/18/2024)    Readmission Risk Interventions     No data to display
